# Patient Record
Sex: Female | Born: 1935
Health system: Southern US, Community
[De-identification: ages and names within clinical notes are randomized; demographics above are authoritative.]

## PROBLEM LIST (undated history)

## (undated) DIAGNOSIS — I509 Heart failure, unspecified: Secondary | ICD-10-CM

## (undated) DIAGNOSIS — F419 Anxiety disorder, unspecified: Secondary | ICD-10-CM

## (undated) DIAGNOSIS — J189 Pneumonia, unspecified organism: Secondary | ICD-10-CM

## (undated) DIAGNOSIS — R011 Cardiac murmur, unspecified: Secondary | ICD-10-CM

## (undated) DIAGNOSIS — R0600 Dyspnea, unspecified: Secondary | ICD-10-CM

## (undated) DIAGNOSIS — I350 Nonrheumatic aortic (valve) stenosis: Secondary | ICD-10-CM

## (undated) DIAGNOSIS — Z8739 Personal history of other diseases of the musculoskeletal system and connective tissue: Secondary | ICD-10-CM

## (undated) DIAGNOSIS — T8859XA Other complications of anesthesia, initial encounter: Secondary | ICD-10-CM

## (undated) DIAGNOSIS — B379 Candidiasis, unspecified: Secondary | ICD-10-CM

## (undated) DIAGNOSIS — I10 Essential (primary) hypertension: Secondary | ICD-10-CM

## (undated) DIAGNOSIS — T4145XA Adverse effect of unspecified anesthetic, initial encounter: Secondary | ICD-10-CM

## (undated) DIAGNOSIS — K8020 Calculus of gallbladder without cholecystitis without obstruction: Secondary | ICD-10-CM

## (undated) DIAGNOSIS — E0590 Thyrotoxicosis, unspecified without thyrotoxic crisis or storm: Secondary | ICD-10-CM

## (undated) DIAGNOSIS — M1990 Unspecified osteoarthritis, unspecified site: Secondary | ICD-10-CM

## (undated) DIAGNOSIS — K219 Gastro-esophageal reflux disease without esophagitis: Secondary | ICD-10-CM

## (undated) DIAGNOSIS — N189 Chronic kidney disease, unspecified: Secondary | ICD-10-CM

## (undated) HISTORY — DX: Personal history of other diseases of the musculoskeletal system and connective tissue: Z87.39

## (undated) HISTORY — PX: TUBAL LIGATION: SHX77

## (undated) HISTORY — PX: EYE SURGERY: SHX253

## (undated) HISTORY — PX: KNEE SURGERY: SHX244

## (undated) HISTORY — DX: Essential (primary) hypertension: I10

## (undated) HISTORY — PX: COLONOSCOPY: SHX174

## (undated) HISTORY — DX: Candidiasis, unspecified: B37.9

## (undated) SURGERY — Surgical Case
Anesthesia: *Unknown

---

## 1972-10-08 HISTORY — PX: ABDOMINAL HYSTERECTOMY: SHX81

## 1973-10-08 HISTORY — PX: HERNIA REPAIR: SHX51

## 1998-02-28 ENCOUNTER — Other Ambulatory Visit: Admission: RE | Admit: 1998-02-28 | Discharge: 1998-02-28 | Payer: Self-pay | Admitting: Internal Medicine

## 1998-11-08 ENCOUNTER — Other Ambulatory Visit: Admission: RE | Admit: 1998-11-08 | Discharge: 1998-11-08 | Payer: Self-pay | Admitting: Gynecology

## 1999-12-11 ENCOUNTER — Other Ambulatory Visit: Admission: RE | Admit: 1999-12-11 | Discharge: 1999-12-11 | Payer: Self-pay | Admitting: Gynecology

## 2001-01-28 ENCOUNTER — Other Ambulatory Visit: Admission: RE | Admit: 2001-01-28 | Discharge: 2001-01-28 | Payer: Self-pay | Admitting: *Deleted

## 2001-05-29 ENCOUNTER — Ambulatory Visit (HOSPITAL_COMMUNITY): Admission: RE | Admit: 2001-05-29 | Discharge: 2001-05-29 | Payer: Self-pay | Admitting: Gastroenterology

## 2001-05-29 ENCOUNTER — Encounter (INDEPENDENT_AMBULATORY_CARE_PROVIDER_SITE_OTHER): Payer: Self-pay | Admitting: *Deleted

## 2002-02-05 ENCOUNTER — Other Ambulatory Visit: Admission: RE | Admit: 2002-02-05 | Discharge: 2002-02-05 | Payer: Self-pay | Admitting: *Deleted

## 2003-04-23 ENCOUNTER — Other Ambulatory Visit: Admission: RE | Admit: 2003-04-23 | Discharge: 2003-04-23 | Payer: Self-pay

## 2004-06-01 ENCOUNTER — Other Ambulatory Visit: Admission: RE | Admit: 2004-06-01 | Discharge: 2004-06-01 | Payer: Self-pay | Admitting: Family Medicine

## 2005-08-21 ENCOUNTER — Encounter: Admission: RE | Admit: 2005-08-21 | Discharge: 2005-08-21 | Payer: Self-pay | Admitting: Family Medicine

## 2006-02-28 ENCOUNTER — Encounter: Admission: RE | Admit: 2006-02-28 | Discharge: 2006-02-28 | Payer: Self-pay | Admitting: Family Medicine

## 2006-06-04 ENCOUNTER — Ambulatory Visit: Payer: Self-pay | Admitting: Family Medicine

## 2006-06-20 ENCOUNTER — Ambulatory Visit: Payer: Self-pay | Admitting: Family Medicine

## 2006-07-29 ENCOUNTER — Ambulatory Visit: Payer: Self-pay | Admitting: Family Medicine

## 2006-08-13 ENCOUNTER — Encounter: Admission: RE | Admit: 2006-08-13 | Discharge: 2006-08-13 | Payer: Self-pay | Admitting: Family Medicine

## 2006-08-19 ENCOUNTER — Encounter: Admission: RE | Admit: 2006-08-19 | Discharge: 2006-08-19 | Payer: Self-pay | Admitting: Family Medicine

## 2006-08-26 ENCOUNTER — Encounter: Admission: RE | Admit: 2006-08-26 | Discharge: 2006-08-26 | Payer: Self-pay | Admitting: Family Medicine

## 2006-09-12 ENCOUNTER — Ambulatory Visit: Payer: Self-pay | Admitting: Pulmonary Disease

## 2006-09-12 ENCOUNTER — Ambulatory Visit: Payer: Self-pay | Admitting: Cardiovascular Disease

## 2006-09-23 ENCOUNTER — Encounter (INDEPENDENT_AMBULATORY_CARE_PROVIDER_SITE_OTHER): Payer: Self-pay | Admitting: *Deleted

## 2006-09-23 ENCOUNTER — Other Ambulatory Visit: Admission: RE | Admit: 2006-09-23 | Discharge: 2006-09-23 | Payer: Self-pay | Admitting: Interventional Radiology

## 2006-09-23 ENCOUNTER — Encounter: Admission: RE | Admit: 2006-09-23 | Discharge: 2006-09-23 | Payer: Self-pay | Admitting: Family Medicine

## 2006-09-25 ENCOUNTER — Ambulatory Visit: Admission: RE | Admit: 2006-09-25 | Discharge: 2006-09-25 | Payer: Self-pay | Admitting: Pulmonary Disease

## 2006-09-25 ENCOUNTER — Ambulatory Visit: Payer: Self-pay | Admitting: Pulmonary Disease

## 2006-09-25 ENCOUNTER — Encounter (INDEPENDENT_AMBULATORY_CARE_PROVIDER_SITE_OTHER): Payer: Self-pay | Admitting: *Deleted

## 2006-10-27 ENCOUNTER — Emergency Department (HOSPITAL_COMMUNITY): Admission: EM | Admit: 2006-10-27 | Discharge: 2006-10-27 | Payer: Self-pay | Admitting: Emergency Medicine

## 2006-10-30 ENCOUNTER — Ambulatory Visit: Payer: Self-pay | Admitting: Pulmonary Disease

## 2006-11-08 HISTORY — PX: LUNG REMOVAL, PARTIAL: SHX233

## 2006-11-12 ENCOUNTER — Ambulatory Visit: Payer: Self-pay | Admitting: Thoracic Surgery

## 2006-11-28 ENCOUNTER — Ambulatory Visit: Payer: Self-pay | Admitting: Thoracic Surgery

## 2006-11-28 ENCOUNTER — Encounter (INDEPENDENT_AMBULATORY_CARE_PROVIDER_SITE_OTHER): Payer: Self-pay | Admitting: Specialist

## 2006-11-28 ENCOUNTER — Inpatient Hospital Stay (HOSPITAL_COMMUNITY): Admission: RE | Admit: 2006-11-28 | Discharge: 2006-12-04 | Payer: Self-pay | Admitting: Thoracic Surgery

## 2006-11-28 ENCOUNTER — Ambulatory Visit: Payer: Self-pay | Admitting: Pulmonary Disease

## 2006-11-28 ENCOUNTER — Encounter: Payer: Self-pay | Admitting: Pulmonary Disease

## 2006-12-11 ENCOUNTER — Ambulatory Visit: Payer: Self-pay | Admitting: Thoracic Surgery

## 2006-12-11 ENCOUNTER — Encounter: Admission: RE | Admit: 2006-12-11 | Discharge: 2006-12-11 | Payer: Self-pay | Admitting: Thoracic Surgery

## 2007-01-01 ENCOUNTER — Ambulatory Visit: Payer: Self-pay | Admitting: Thoracic Surgery

## 2007-01-01 ENCOUNTER — Encounter: Admission: RE | Admit: 2007-01-01 | Discharge: 2007-01-01 | Payer: Self-pay | Admitting: Thoracic Surgery

## 2007-01-07 ENCOUNTER — Ambulatory Visit: Payer: Self-pay | Admitting: Pulmonary Disease

## 2007-01-08 ENCOUNTER — Ambulatory Visit: Payer: Self-pay | Admitting: Family Medicine

## 2007-01-29 ENCOUNTER — Ambulatory Visit: Payer: Self-pay | Admitting: Thoracic Surgery

## 2007-01-29 ENCOUNTER — Encounter: Admission: RE | Admit: 2007-01-29 | Discharge: 2007-01-29 | Payer: Self-pay | Admitting: Thoracic Surgery

## 2007-03-26 ENCOUNTER — Encounter: Admission: RE | Admit: 2007-03-26 | Discharge: 2007-03-26 | Payer: Self-pay | Admitting: Thoracic Surgery

## 2007-03-26 ENCOUNTER — Ambulatory Visit: Payer: Self-pay | Admitting: Thoracic Surgery

## 2007-05-29 ENCOUNTER — Ambulatory Visit: Payer: Self-pay | Admitting: Thoracic Surgery

## 2007-05-29 ENCOUNTER — Encounter: Admission: RE | Admit: 2007-05-29 | Discharge: 2007-05-29 | Payer: Self-pay | Admitting: Thoracic Surgery

## 2007-08-19 ENCOUNTER — Emergency Department (HOSPITAL_COMMUNITY): Admission: EM | Admit: 2007-08-19 | Discharge: 2007-08-19 | Payer: Self-pay | Admitting: *Deleted

## 2007-08-27 ENCOUNTER — Encounter: Admission: RE | Admit: 2007-08-27 | Discharge: 2007-08-27 | Payer: Self-pay | Admitting: Thoracic Surgery

## 2007-08-27 ENCOUNTER — Ambulatory Visit: Payer: Self-pay | Admitting: Thoracic Surgery

## 2007-10-30 ENCOUNTER — Ambulatory Visit: Payer: Self-pay | Admitting: Family Medicine

## 2007-11-11 ENCOUNTER — Ambulatory Visit: Payer: Self-pay | Admitting: Pulmonary Disease

## 2007-11-11 DIAGNOSIS — R0602 Shortness of breath: Secondary | ICD-10-CM | POA: Insufficient documentation

## 2007-11-12 DIAGNOSIS — M549 Dorsalgia, unspecified: Secondary | ICD-10-CM | POA: Insufficient documentation

## 2007-12-18 ENCOUNTER — Ambulatory Visit: Payer: Self-pay | Admitting: Family Medicine

## 2008-02-24 IMAGING — CR DG CHEST 2V
2 series · 2 of 2 positions shown · non-contrast
Comparison: 01/19/07

CLINICAL DATA: Post thoracotomy for lung cancer.
 CHEST - 2 VIEW:

[w chest pa]
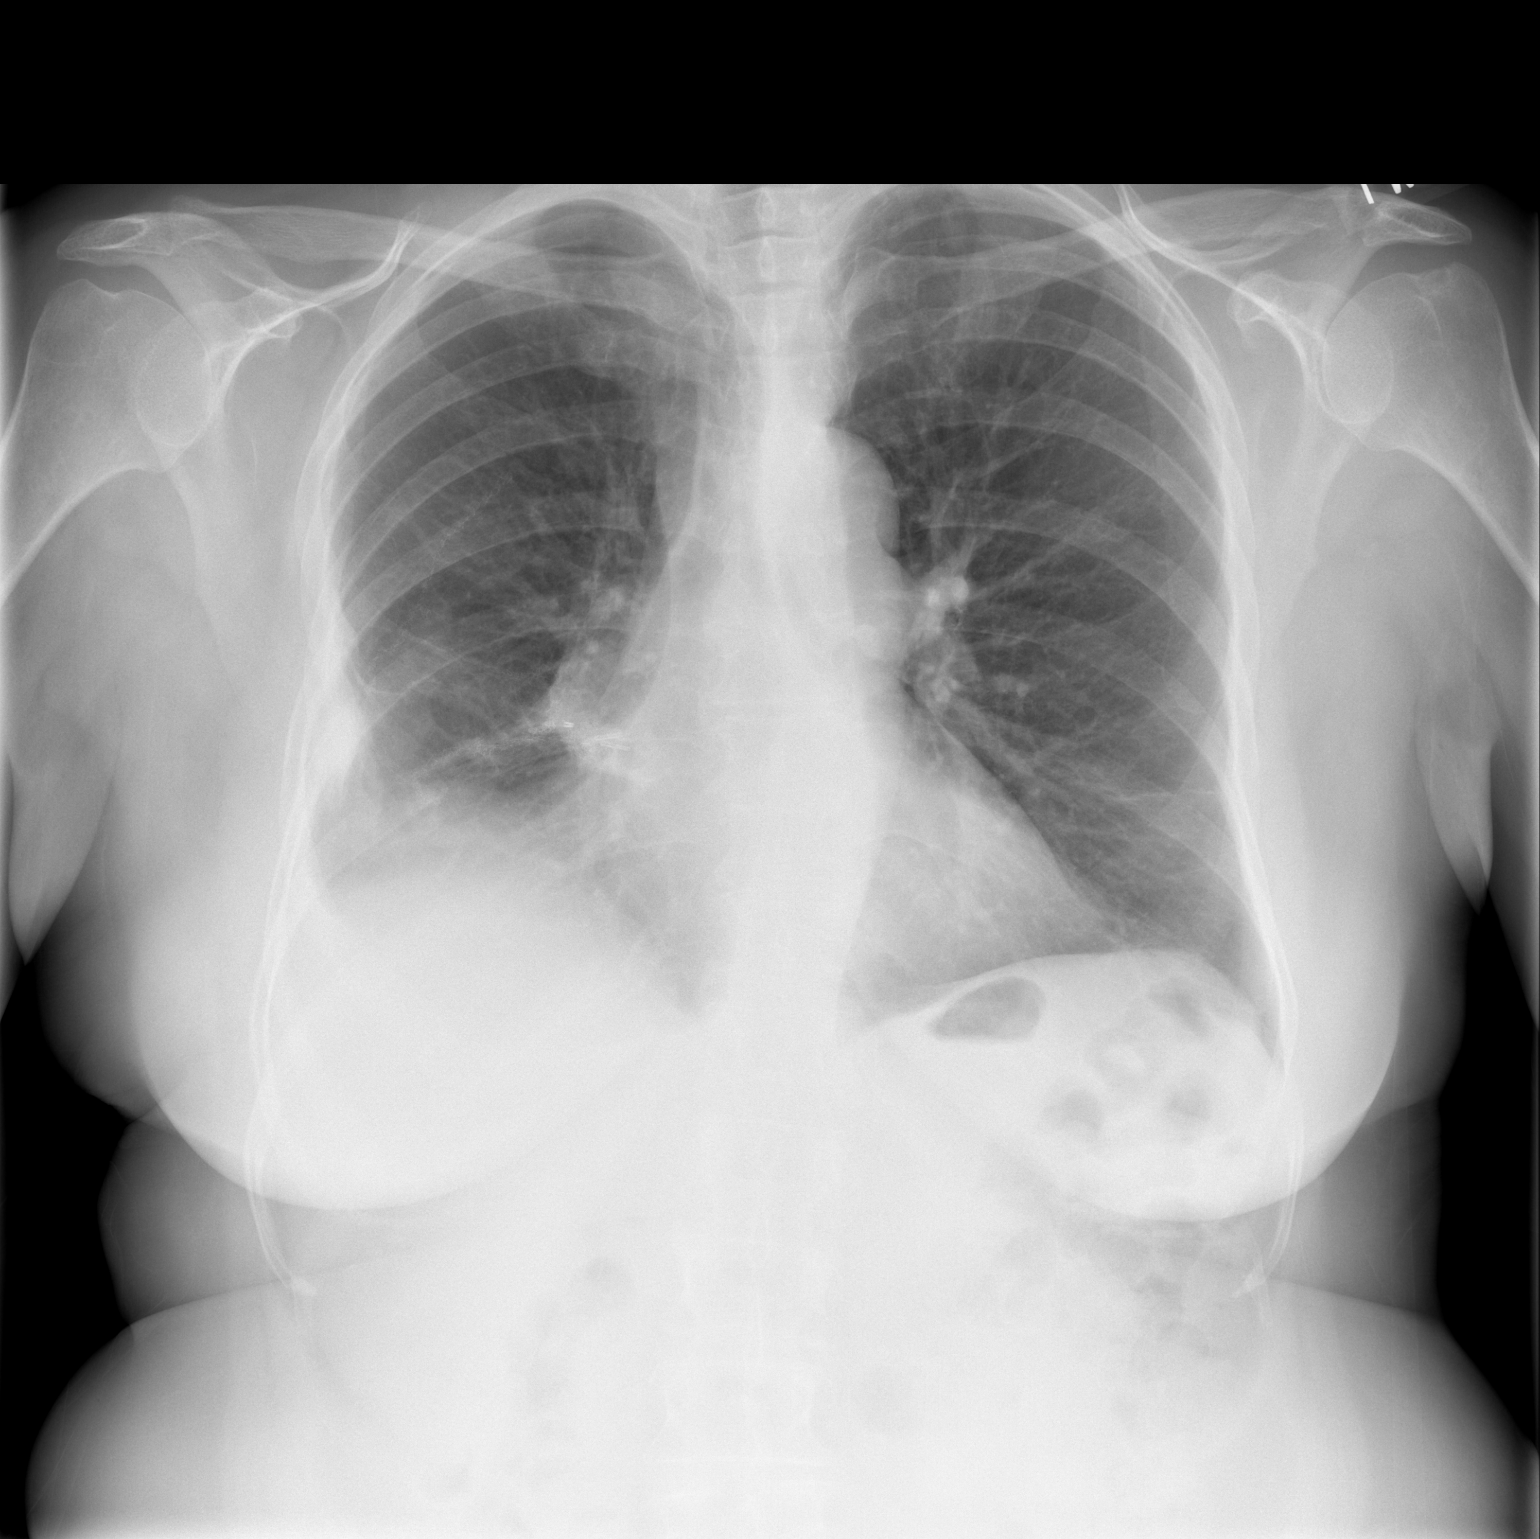

[w chest lat]
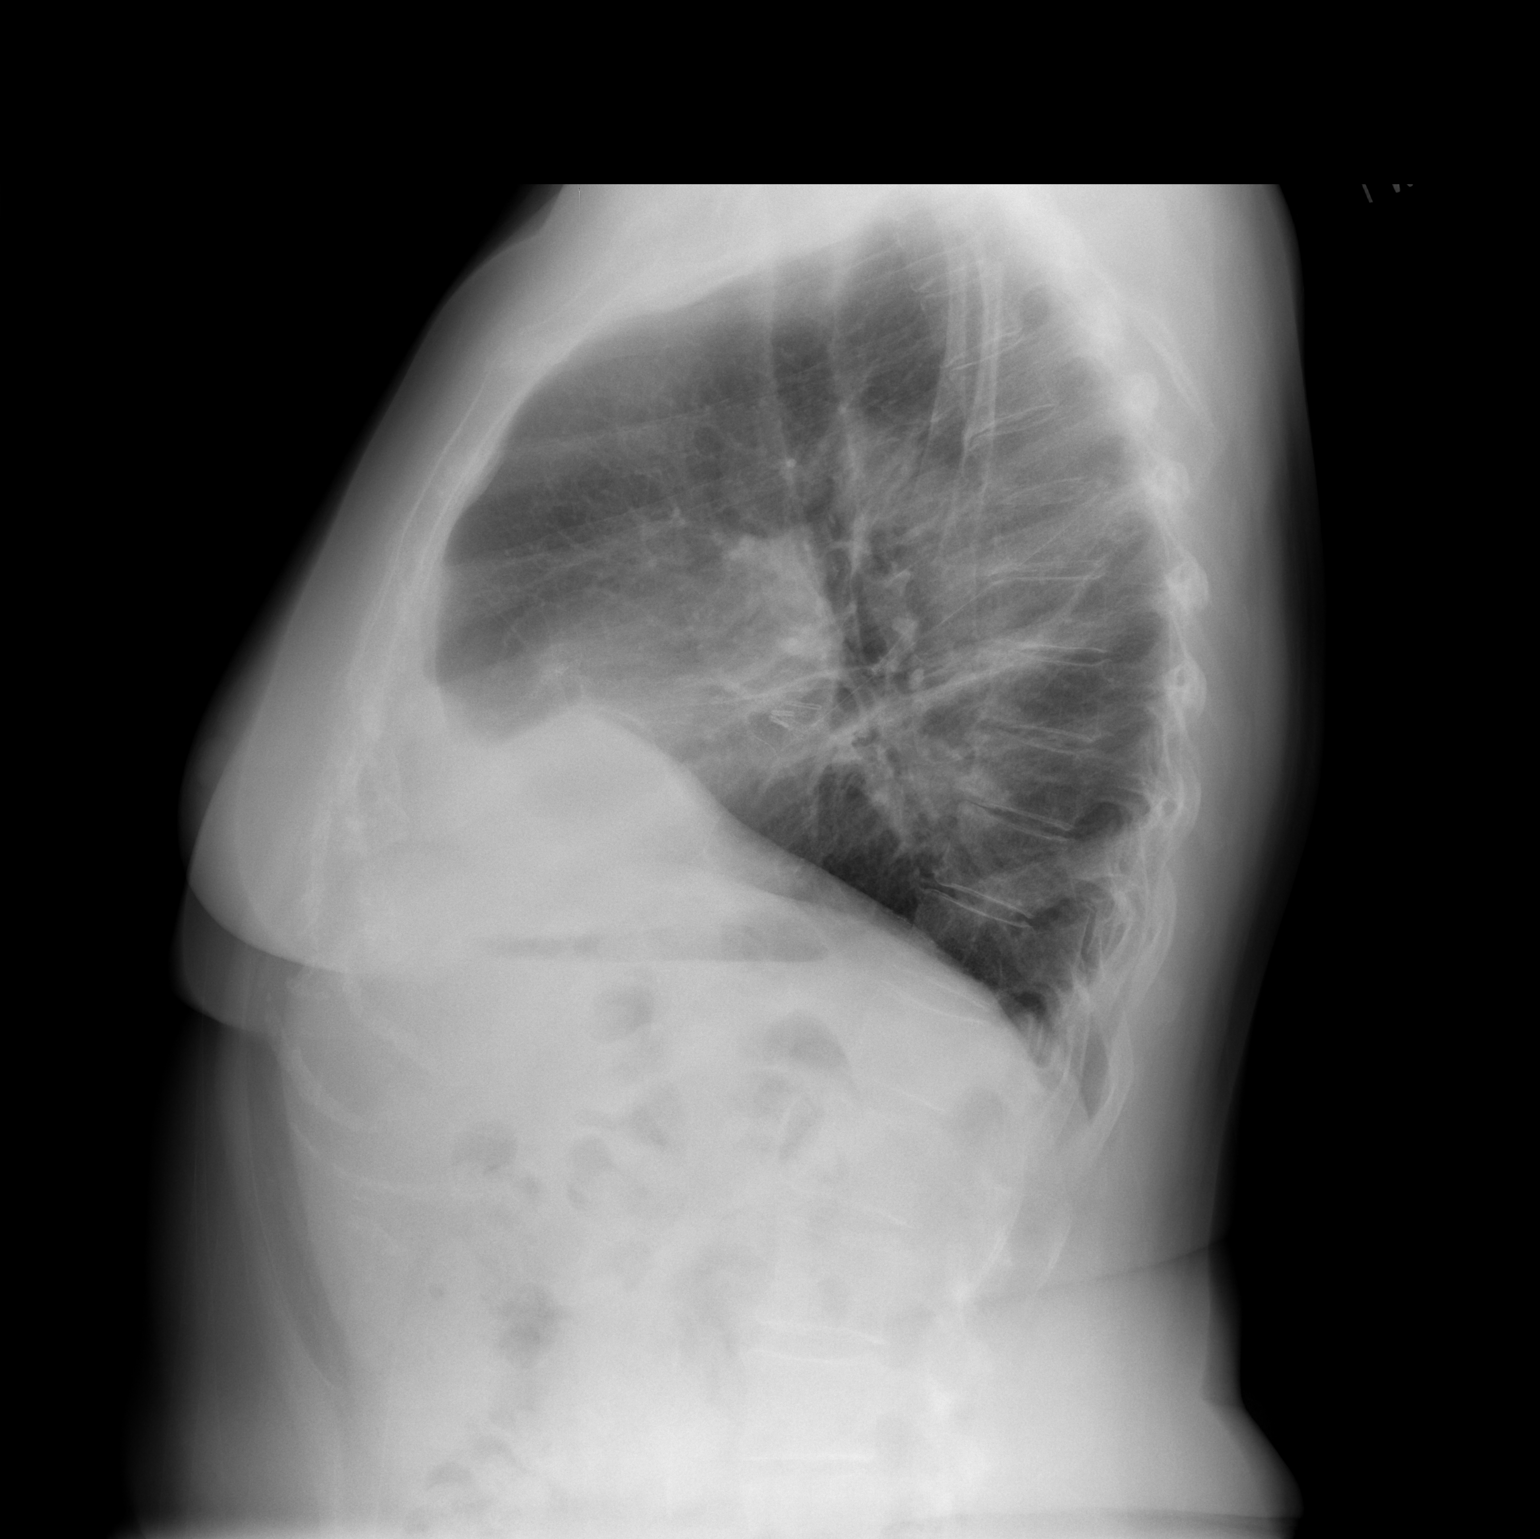

[2 of 2 positions shown; findings below may reference images not displayed]

FINDINGS: Post operative changes are noted at the right base with pleuroparenchymal scarring.  On the PA view, there appears to be a nodule in the left lung apex that was not present previously.  This could be some type of artifact, but a metastatic lesion cannot be excluded.  Consider short term follow-up by chest x-ray or CT.
 The lungs are otherwise clear.  Bony structures intact.
IMPRESSION: 1.  Post operative changes at the right base. 
 2.  Abnormal density in the left lung apex - see report

## 2008-05-26 ENCOUNTER — Ambulatory Visit: Payer: Self-pay | Admitting: Pulmonary Disease

## 2008-05-26 DIAGNOSIS — J479 Bronchiectasis, uncomplicated: Secondary | ICD-10-CM | POA: Insufficient documentation

## 2008-07-27 ENCOUNTER — Encounter (HOSPITAL_COMMUNITY): Admission: RE | Admit: 2008-07-27 | Discharge: 2008-10-07 | Payer: Self-pay | Admitting: Endocrinology

## 2008-11-26 ENCOUNTER — Ambulatory Visit: Payer: Self-pay | Admitting: Pulmonary Disease

## 2008-12-22 ENCOUNTER — Encounter: Payer: Self-pay | Admitting: Pulmonary Disease

## 2008-12-30 ENCOUNTER — Ambulatory Visit (HOSPITAL_COMMUNITY): Admission: RE | Admit: 2008-12-30 | Discharge: 2008-12-30 | Payer: Self-pay | Admitting: Cardiology

## 2008-12-30 ENCOUNTER — Encounter: Payer: Self-pay | Admitting: Pulmonary Disease

## 2009-03-29 ENCOUNTER — Encounter: Payer: Self-pay | Admitting: Pulmonary Disease

## 2009-07-13 ENCOUNTER — Emergency Department (HOSPITAL_COMMUNITY): Admission: EM | Admit: 2009-07-13 | Discharge: 2009-07-14 | Payer: Self-pay | Admitting: Emergency Medicine

## 2010-07-06 ENCOUNTER — Ambulatory Visit: Payer: Self-pay | Admitting: Pulmonary Disease

## 2010-07-21 ENCOUNTER — Ambulatory Visit: Payer: Self-pay | Admitting: Pulmonary Disease

## 2010-08-28 ENCOUNTER — Telehealth (INDEPENDENT_AMBULATORY_CARE_PROVIDER_SITE_OTHER): Payer: Self-pay | Admitting: *Deleted

## 2010-09-11 ENCOUNTER — Encounter: Admission: RE | Admit: 2010-09-11 | Discharge: 2010-09-11 | Payer: Self-pay | Admitting: Internal Medicine

## 2010-10-11 ENCOUNTER — Ambulatory Visit: Admit: 2010-10-11 | Payer: Self-pay | Admitting: Pulmonary Disease

## 2010-10-12 ENCOUNTER — Telehealth: Payer: Self-pay | Admitting: Pulmonary Disease

## 2010-10-16 ENCOUNTER — Ambulatory Visit
Admission: RE | Admit: 2010-10-16 | Discharge: 2010-10-16 | Payer: Self-pay | Source: Home / Self Care | Attending: Pulmonary Disease | Admitting: Pulmonary Disease

## 2010-10-29 ENCOUNTER — Encounter: Payer: Self-pay | Admitting: Cardiology

## 2010-11-07 NOTE — Assessment & Plan Note (Signed)
Summary: acute sick visit for dyspnea   Primary Provider/Referring Provider:  [name]  CC:  follow up. Pt states her breathing has not been doing well. Pt states any little activity she does is making it more difficult to breath and she has to start breathing out her mouth. .  History of Present Illness: The pt comes in today for an acute sick visit regarding sob.  She has known RML bronchiectasis with recurrent infections, and is s/p lobectomy with resolution of this issue.  She came in to see me last year with chest and throat pressure/fullness, and found to have minimal airflow obstruction on spirometry.  She did not have an improvement in her symptoms with albuterol.  I recommended a cardiac w/u since I could find no pulmonary issue to explain her symptoms, and apparently this was done (the records will be obtained).  She comes in today where she is having more sob with the least bit of exertion.  She denies any cough, congestion, or mucus production.    Current Medications (verified): 1)  Adult Aspirin Ec Low Strength 81 Mg  Tbec (Aspirin) .... Once Daily 2)  Metoprolol Tartrate 25 Mg  Tabs (Metoprolol Tartrate) .... Once Daily 3)  Trilipix 135 Mg  Cpdr (Choline Fenofibrate) .... Take 1 Tablet By Mouth Once A Day 4)  Clonazepam 0.5 Mg  Tabs (Clonazepam) .... Once Daily As Needed 5)  Tylenol Extra Strength 500 Mg  Tabs (Acetaminophen) .... As Needed 6)  Fish Oil 1000 Mg  Caps (Omega-3 Fatty Acids) .... Take 1 Tablet By Mouth Three Times A Day 7)  Methimazole 10 Mg Tabs (Methimazole) .... 1/2 Once Daily 8)  Vitamin D 04540 Unit Caps (Ergocalciferol) .Marland Kitchen.. 1 Weekly 9)  Furosemide 20 Mg Tabs (Furosemide) .... Once Daily 10)  Glucosamine-Chondroitin 1500-1200 Mg/61ml Liqd (Glucosamine-Chondroitin) .... Once Daily 11)  Klor-Con 10 10 Meq Cr-Tabs (Potassium Chloride) .... Once Daily 12)  Boniva 150 Mg Tabs (Ibandronate Sodium) .... Once A Month  Allergies (verified): 1)  ! Advil  Past  History:  Past medical, surgical, family and social histories (including risk factors) reviewed, and no changes noted (except as noted below).  Past Medical History: Reviewed history from 11/10/2007 and no changes required. Pneumonia  Family History: Reviewed history and no changes required.  Social History: Reviewed history and no changes required.  Review of Systems       The patient complains of shortness of breath with activity and joint stiffness or pain.  The patient denies shortness of breath at rest, productive cough, non-productive cough, coughing up blood, chest pain, irregular heartbeats, acid heartburn, indigestion, loss of appetite, weight change, abdominal pain, difficulty swallowing, sore throat, tooth/dental problems, headaches, nasal congestion/difficulty breathing through nose, sneezing, itching, ear ache, anxiety, depression, hand/feet swelling, rash, change in color of mucus, and fever.    Vital Signs:  Patient profile:   77 year old female Height:      61.5 inches Weight:      209.50 pounds BMI:     39.08 O2 Sat:      96 % on Room air Temp:     97.9 degrees F oral Pulse rate:   62 / minute BP sitting:   112 / 78  (right arm) Cuff size:   regular  Vitals Entered By: Carver Fila (July 06, 2010 3:09 PM)  O2 Flow:  Room air CC: follow up. Pt states her breathing has not been doing well. Pt states any little activity she does is  making it more difficult to breath and she has to start breathing out her mouth.  Comments meds and allergies updated Phone number updated Carver Fila  July 06, 2010 3:09 PM    Physical Exam  General:  obese female in nad Nose:  patent without discharge Mouth:  clear Neck:  no tmg Lungs:  totally clear, no wheezing or rhonchi Heart:  rrr, no mrg Extremities:  1+ edema , no cyanosis  pulses intact distally. Neurologic:  alert and oriented, moves all 4.   Impression & Recommendations:  Problem # 1:  DYSPNEA  (ICD-786.05) the pt is having worsening doe with no obvious pulmonary issue found.  Her lungs are clear, she has a h/o only minimal airflow obstruction, but she is obese and likely to have deconditioning.  I will check a cxr today for completeness, but are unlikely to find anything.  I will need to try and get her cardiac records from last year to see if there were any abnormalities to suggest a reason for her shortness of breath.  Will also repeat her pfts in the event there has been worsening of her prior minimal obstructive disease.  Problem # 2:  BRONCHIECTASIS WITHOUT ACUTE EXACERBATION (ICD-494.0) the pt has a h/o RML bronchiectasis that has been corrected with RML lobectomy.  She has not had issues with pulmonary infection since.    Medications Added to Medication List This Visit: 1)  Methimazole 10 Mg Tabs (Methimazole) .... 1/2 once daily 2)  Furosemide 20 Mg Tabs (Furosemide) .... Once daily 3)  Glucosamine-chondroitin 1500-1200 Mg/32ml Liqd (Glucosamine-chondroitin) .... Once daily 4)  Klor-con 10 10 Meq Cr-tabs (Potassium chloride) .... Once daily 5)  Boniva 150 Mg Tabs (Ibandronate sodium) .... Once a month  Other Orders: Est. Patient Level IV (16109) Pulmonary Referral (Pulmonary) T-2 View CXR (71020TC)  Patient Instructions: 1)  will get records from your cardiologist to review 2)  will check cxr today, and let you know results 3)  will schedule for breathing tests and see you back on same day to review with you. 4)  work on weight loss

## 2010-11-07 NOTE — Progress Notes (Signed)
Summary: dulera rx  Phone Note Call from Patient   Caller: Patient Call For: clance Summary of Call: pt would like prescript for dulera .she had samples only [facility]  [place] Initial call taken by: Rickard Patience,  August 28, 2010 4:52 PM  Follow-up for Phone Call        pt states Elwin Sleight is working well for her pls advise if ok to give rx? Follow-up by: Philipp Deputy CMA,  August 28, 2010 5:00 PM  Additional Follow-up for Phone Call Additional follow up Details #1::        ok to call in one, 3 fills needs to see me in 6 weeks in office. Additional Follow-up by: Barbaraann Share MD,  August 28, 2010 5:31 PM    Additional Follow-up for Phone Call Additional follow up Details #2::    Called, spoke with pt.  She is aware dulera rx sent to [facility] and [facility] would like to see her back in 6 wks.  ROV scheduled for 10/11/10 at 1:45pm - pt aware. Crystal Jones RN  August 29, 2010 9:02 AM   New/Updated Medications: DULERA 100-5 MCG/ACT AERO (MOMETASONE FURO-FORMOTEROL FUM) Inhale 2 puffs in am and pm.  Rinse mouth well Prescriptions: DULERA 100-5 MCG/ACT AERO (MOMETASONE FURO-FORMOTEROL FUM) Inhale 2 puffs in am and pm.  Rinse mouth well  #1 x 2   Entered by:   Gweneth Dimitri RN   Authorized by:   Barbaraann Share MD   Signed by:   Gweneth Dimitri RN on 08/29/2010   Method used:   Electronically to        [facility] (retail)       [address]       [place], [place]  [address]       Ph: [phone]       Fax: [phone]   RxID:   [id number]

## 2010-11-07 NOTE — Miscellaneous (Signed)
Summary: Orders Update pft charges  Clinical Lists Changes  Orders: Added new Service order of Carbon Monoxide diffusing w/capacity (94720) - Signed Added new Service order of Lung Volumes (94240) - Signed Added new Service order of Spirometry (Pre & Post) (94060) - Signed 

## 2010-11-07 NOTE — Assessment & Plan Note (Signed)
Summary: ov for back pain and sob   Chief Complaint:  OV back pain referred by Dr Susann Givens.  History of Present Illness: the patient comes in today with the complaint of left shoulder and back pain.  She is status post right middle lobectomy for right middle lobe syndrome, and has been doing very very well from a pulmonary perspective since her surgery.  She has had no further episodes of pneumonia since that time.  She has been having pain extending into her right breast and associated with her surgical incision.  This has been followed by thoracic surgery.  She also complains of some mild increased shortness of breath and some chest tightness.  She has no known history of obstructive lung disease.       Risk Factors:  Tobacco use:  never   Review of Systems      See HPI   Vital Signs:  Patient Profile:   77 Years Old Female Weight:      208.50 pounds O2 Sat:      97 % O2 treatment:    Room Air Temp:     98.4 degrees F oral Pulse rate:   65 / minute BP sitting:   132 / 80  (left arm)  Vitals Entered By: Cloyde Reams RN (November 11, 2007 10:22 AM)             Comments Pt here today for OV for back pain referred by Dr Susann Givens. Pt had right middle lobe removed by Dr Edwyna Shell a year ago.  Pt  was told to f/u with Dr Shelle Iron.  Pt c/o pain under L shoulder blade in back.  C/o chest tightness.  Still has soreness in R breast, Dr Edwyna Shell advised pt it was nerve pain, but pt wants to know how long this will last. Medications reviewed      Physical Exam  General:     overweight female in no acute distress Lungs:     chest is totally clear to auscultation with no wheezes or rhonchi Heart:     regular rate and rhythm, no murmurs rubs or gallops     Impression & Recommendations:  Problem # 1:  DYSPNEA (ICD-786.05) the patient comes in today complaining of mild increased shortness of breath.  There is nothing to suggest bronchospasm or other acute pulmonary process.  Her lungs  are totally clear to auscultation.  I will check a chest x-ray today for completeness, however spirometry really shows no significant airflow obstruction.  She obviously needs to work on weight loss and conditioning.   Problem # 2:  BACK PAIN (ICD-724.5) the patient is complaining of left shoulder and left back pain, and her surgery was done overall in the right side.  I will check a chest x-ray for completeness, but I suspect this is musculoskeletal.  If the chest x-ray is unremarkable, I have asked the patient to discuss her discomfort with her primary physician.  Medications Added to Medication List This Visit: 1)  Tylenol Extra Strength 500 Mg Tabs (Acetaminophen) .... As needed   Patient Instructions: 1)  will check xray today.  if no issues found, and left back pain continues, please call your primary care md for follow up 2)  work on weight loss and exercise program.    ]   Pulmonary Testing  Spirometry  Actual FVC: 2.48 L FEV1 1.71 L FEV1/FVC: 69 %  Predicted  % Predicted FVC: 95% FEV1: 83%  Interpretation Study normal Comments: patient  technique is barely adequate.  Oximetry  Resting Data: Pulse: 65 BPM

## 2010-11-07 NOTE — Assessment & Plan Note (Signed)
Summary: rov for dyspnea, review of pfts   Primary Provider/Referring Provider:  [name]  CC:  followup on pfts.  History of Present Illness: The pt comes in today for f/u of her recent pfts as part of a w/u for doe unkown origin.  She was found to have very mild obstruction, no restriction, and a mildly decreased DLCO that corrects with Av.  I have reviewed the study with her in detail, and answered all of her questions.    Current Medications (verified): 1)  Adult Aspirin Ec Low Strength 81 Mg  Tbec (Aspirin) .... Once Daily 2)  Metoprolol Tartrate 25 Mg  Tabs (Metoprolol Tartrate) .... Once Daily 3)  Trilipix 135 Mg  Cpdr (Choline Fenofibrate) .... Take 1 Tablet By Mouth Once A Day 4)  Clonazepam 0.5 Mg  Tabs (Clonazepam) .... Once Daily As Needed 5)  Tylenol Extra Strength 500 Mg  Tabs (Acetaminophen) .... As Needed 6)  Fish Oil 1000 Mg  Caps (Omega-3 Fatty Acids) .... Take 1 Tablet By Mouth Three Times A Day 7)  Methimazole 10 Mg Tabs (Methimazole) .... 1/2 Once Daily 8)  Furosemide 20 Mg Tabs (Furosemide) .... Once Daily 9)  Glucosamine-Chondroitin 1500-1200 Mg/64ml Liqd (Glucosamine-Chondroitin) .... Once Daily 10)  Klor-Con 10 10 Meq Cr-Tabs (Potassium Chloride) .... Once Daily 11)  Boniva 150 Mg Tabs (Ibandronate Sodium) .... Once A Month 12)  Otc Vitamin D 2000iu .Marland Kitchen.. 3 Times A Week  Allergies: 1)  ! Advil  Past History:  Past medical, surgical, family and social histories (including risk factors) reviewed, and no changes noted (except as noted below).  Past Medical History:  H/o RML bronchiectasis with recurrent pna...s/p RM lobectomy  Family History: Reviewed history and no changes required.  Social History: Reviewed history and no changes required.  Review of Systems       The patient complains of shortness of breath with activity, productive cough, indigestion, anxiety, hand/feet swelling, joint stiffness or pain, and rash.  The patient denies shortness  of breath at rest, coughing up blood, chest pain, irregular heartbeats, acid heartburn, loss of appetite, weight change, abdominal pain, difficulty swallowing, sore throat, tooth/dental problems, headaches, nasal congestion/difficulty breathing through nose, sneezing, itching, ear ache, depression, and fever.    Vital Signs:  Patient profile:   77 year old female Height:      61.5 inches Weight:      211 pounds BMI:     39.36 O2 Sat:      97 % on Room air Temp:     97.8 degrees F oral Pulse rate:   68 / minute BP sitting:   118 / 78  (right arm) Cuff size:   large  Vitals Entered By: Kandice Hams CMA (July 21, 2010 12:05 PM)  O2 Flow:  Room air CC: followup on pfts  Does patient need assistance? Functional Status Self care Comments meds and pharmacy verified   Physical Exam  General:  obese female in nad Nose:  no discharge or purulence  Lungs:  totally clear to auscultation, no wheezing or rhonchi Heart:  rrr, n 2/6 sem Extremities:  1+ edema noted, no cyanosis  Neurologic:  alert and oriented, moves all 4.   Impression & Recommendations:  Problem # 1:  DYSPNEA (ICD-786.05) the pt continues to have doe with only minimal obstruction on her current pfts.  We have tried her on albuterol as needed in the past with no improvement.  I suspect her airflow obstruction has nothing to do with  her current symptoms, and her recent cxr showed no acute process as well.  To put the issue to rest, will try her on dulera for a 2 week period to see if she improves.  If she does not, there is really nothing from a pulmonary standpoint that is an obvious cause for her symptoms.    Medications Added to Medication List This Visit: 1)  Otc Vitamin D 2000iu  .Marland Kitchen.. 3 times a week  Other Orders: Est. Patient Level IV (62130)  Patient Instructions: 1)  will give you a trial of dulera 100/5  2 inhalations in am and pm..rinse mouth well. 2)  please call with an update on your condition in 2  weeks.  Appended Document: rov for dyspnea, review of pfts received cardiac records on pt from [place]. pt had echo 12/2008, and showed nl EF but impaired LV relaxation, mild increase in RV pressure. she also had a stress myoview that was unremarkable in 12/2008

## 2010-11-07 NOTE — Letter (Signed)
Summary: [facility]  [facility]   Imported By: Lennie Odor 07/21/2010 15:25:51  _____________________________________________________________________  External Attachment:    Type:   Image     Comment:   External Document

## 2010-11-07 NOTE — Assessment & Plan Note (Signed)
Summary: rov for dyspnea, chest tightness   PCP:  [name]  Chief Complaint:  6 month followup. Pt states that her breathing is about the same since last seen.  She states that she is trying to work on wt. loss but this is difficult for her due to sob with walking.  She states that she tried using albuterol mdi for rescue but states this did not help at all but she used the doses in the inhaler..  History of Present Illness: the pt comes in today for f/u of her dyspnea and chest pressure associated with exertion.  She has a history of the RML syndrome, and is s/p lobectomy with significant improvement in her recurrent infections.  At the last visit, she was having persistent doe and upper chest/throat pressure.  I felt this was secondary to obesity and conditioning, but couldn't exclude cardiac etiology.  I gave her a trial of as needed albuterol to see if it would help, but asked her to contact her primary md if continued to consider a cardiac evaluation.  She comes in today where she states the albuterol did not help, and the doe and chest pressure with exertion has continued.  Unfortunately, she did not communicate with her primary md.  She denies any cough or mucus, and no recent chest infection.    Current Allergies: No known allergies   Risk Factors:  Tobacco use:  never  Review of Systems      See HPI  Vital Signs:  Patient Profile:   77 Years Old Female Weight:      216.56 pounds O2 Sat:      98 % O2 treatment:    Room Air Temp:     97.6 degrees F oral Pulse rate:   64 / minute BP sitting:   116 / 74  (left arm)  Vitals Entered By: Vernie Murders (November 26, 2008 10:03 AM)                Physical Exam  General:     obese female in nad Lungs:     totally clear to auscultation, no wheezing. Heart:     rrr   Problem # 1:  DYSPNEA (ICD-786.05) the pt continues to have doe with chest/throat pressure with exertion.  She has minimal airflow obstruction on  spirometry today, and did not see a difference with albuterol.  I suspect this has nothing to do with her current symptoms.  Her obesity and deconditioning are clearly playing roles, but I remained concerned about possible cardiac etiologies.  I really think she needs to have a cardiac work-up, and if negative, at least she knows she can push herself without getting into trouble.  Problem # 2:  BRONCHIECTASIS WITHOUT ACUTE EXACERBATION (ICD-494.0) this appears to be totally stable since her lobectomy.    Medications Added to Medication List This Visit: 1)  Methimazole 10 Mg Tabs (Methimazole) .Marland Kitchen.. 1 once daily 2)  Vitamin D 04540 Unit Caps (Ergocalciferol) .Marland KitchenMarland KitchenMarland Kitchen 1 weekly  Patient Instructions: 1)  continue to work on weight loss and conditioning 2)  please call primary md to set up cardiac evaluation.  I will fax a copy of this note to her today.

## 2010-11-07 NOTE — Assessment & Plan Note (Signed)
Summary: acute sick visit for dyspnea and atypical chest pain   PCP:  [name]  Chief Complaint:  Pt is here for a sick visit.  Pt c/o increased sob with activity- better at rest.  Pt also c/o wheezing.  Pt denied a cough or chest pain. Marland Kitchen  History of Present Illness: the pt is a 77 y/o female with known rml syndrome,  but post lobectomy, who comes in today for complaint of sob and atypical chest discomfort.  She has had pft's which showed no obstructive disease in the past, but feels that she does get sob with exertional activities.  She denies cough or purulence.  She admits to not being very active.  She has had chest discomfort of unknown etiology, but felt to be msk from her thoracic surgery.  She describes some chest tightness as well, but it is not related to exertion.        Review of Systems      See HPI   Vital Signs:  Patient Profile:   77 Years Old Female Weight:      213.38 pounds O2 Sat:      97 % O2 treatment:    Room Air Temp:     97.9 degrees F oral Pulse rate:   68 / minute BP sitting:   128 / 76  (left arm) Cuff size:   regular  Vitals Entered By: Cyndia Diver LPN (May 26, 2008 10:18 AM)             Comments Medications reviewed with patient Cyndia Diver LPN  May 26, 2008 10:18 AM      Physical Exam  General:     ow female in nad Lungs:     clear to auscultation Heart:     rrr      Impression & Recommendations:  Problem # 1:  BRONCHIECTASIS WITHOUT ACUTE EXACERBATION (ICD-494.0) the pt is doing well from a pulmonary standpoint.  She never has been documented to have obstructive disease, and is not having a chest infection at this point.  I don't mind giving her an albuterol inhaler to try as needed and see if it helps.  She will let me know.  I suspect her dyspnea is related to weight and conditioning issues, but can't exclude a cardiac process.  Problem # 2:  BACK PAIN (ICD-724.5) the pt has been having atypical back and chest  pain since surgery, and suspect it is msk.  The history is not suggestive of angina, but I have told her that she may need cardiac w/u with primary md if symptoms continue.  Medications Added to Medication List This Visit: 1)  Trilipix 135 Mg Cpdr (Choline fenofibrate) .... Take 1 tablet by mouth once a day 2)  Fish Oil 1000 Mg Caps (Omega-3 fatty acids) .... Take 1 tablet by mouth three times a day 3)  Glucosamine-chondroitin 250-200 Mg Caps (Glucosamine-chondroitin) .... Take by mouth as needed   Patient Instructions: 1)  will try albuterol 2 puffs every 4 hrs if needed 2)  if you continue to have chest and throat tightness, you need to call primary md in the event this could be your heart. 3)  f/u with me in 6mos (cancel pre-existing apptms)   ]

## 2010-11-07 NOTE — Progress Notes (Signed)
Summary: persistant cough/[facility]  persistant cough/[facility]   Imported By: Lester  05/27/2008 07:48:52  _____________________________________________________________________  External Attachment:    Type:   Image     Comment:   External Document

## 2010-11-07 NOTE — Progress Notes (Signed)
Summary: f/u bronchoscopy/[facility]  f/u bronchoscopy/[facility]   Imported By: Lester [place] 05/27/2008 07:50:29  _____________________________________________________________________  External Attachment:    Type:   Image     Comment:   External Document

## 2010-11-07 NOTE — Progress Notes (Signed)
Summary: f/u lobectomy/[facility]  f/u lobectomy/[facility]   Imported By: Lester [place] 05/27/2008 07:52:21  _____________________________________________________________________  External Attachment:    Type:   Image     Comment:   External Document

## 2010-11-09 NOTE — Assessment & Plan Note (Signed)
Summary: rov for doe   Primary Provider/Referring Provider:  [name]  CC:  F/u appt.  States Elwin Sleight is helping with breathing but still has sob with exertion.  Also c/o PND--clear drainage.Marland Kitchen  History of Present Illness: The pt comes in today for f/u of her doe.  She was found to have very subtle airflow obstruction on pfts, and was started on dulera as a trial.  She comes in today where she continues to have doe, and is unsure if the dulera has really helped.  I have reminded her that it was unclear to me if her mild pft abnormality had anything to do with her sob.  She denies any cough or congestion.  Current Medications (verified): 1)  Adult Aspirin Ec Low Strength 81 Mg  Tbec (Aspirin) .... Once Daily 2)  Metoprolol Tartrate 25 Mg  Tabs (Metoprolol Tartrate) .... Once Daily 3)  Trilipix 135 Mg  Cpdr (Choline Fenofibrate) .... Take 1 Tablet By Mouth Once A Day 4)  Clonazepam 0.5 Mg  Tabs (Clonazepam) .... Once Daily As Needed 5)  Tylenol Extra Strength 500 Mg  Tabs (Acetaminophen) .... As Needed 6)  Fish Oil 1000 Mg  Caps (Omega-3 Fatty Acids) .... Take 1 Tablet By Mouth Three Times A Day 7)  Methimazole 10 Mg Tabs (Methimazole) .... 1/2 Once Daily 8)  Furosemide 20 Mg Tabs (Furosemide) .... Once Daily 9)  Glucosamine-Chondroitin 1500-1200 Mg/55ml Liqd (Glucosamine-Chondroitin) .... Once Daily 10)  Klor-Con 10 10 Meq Cr-Tabs (Potassium Chloride) .... Once Daily 11)  Boniva 150 Mg Tabs (Ibandronate Sodium) .... Once A Month 12)  Otc Vitamin D 2000iu .Marland Kitchen.. 3 Times A Week 13)  Dulera 100-5 Mcg/act Aero (Mometasone Furo-Formoterol Fum) .... Inhale 2 Puffs in Am and Pm.  Rinse Mouth Well 14)  Vitamin C .... Take 1 Tablet By Mouth Once A Day  Allergies (verified): 1)  ! Advil  Review of Systems       The patient complains of shortness of breath with activity, productive cough, nasal congestion/difficulty breathing through nose, hand/feet swelling, and joint stiffness or pain.  The  patient denies shortness of breath at rest, non-productive cough, coughing up blood, chest pain, irregular heartbeats, acid heartburn, indigestion, loss of appetite, weight change, abdominal pain, difficulty swallowing, sore throat, tooth/dental problems, headaches, sneezing, itching, ear ache, anxiety, depression, rash, change in color of mucus, and fever.    Vital Signs:  Patient profile:   77 year old female Height:      61.5 inches Weight:      216.25 pounds BMI:     40.34 O2 Sat:      99 % on Room air Temp:     97.6 degrees F oral Pulse rate:   67 / minute BP sitting:   122 / 80  (left arm) Cuff size:   large  Vitals Entered By: Arman Filter LPN (October 16, 2010 11:27 AM)  O2 Flow:  Room air CC: F/u appt.  States Elwin Sleight is helping with breathing but still has sob with exertion.  Also c/o PND--clear drainage. Comments Medications reviewed with patient Arman Filter LPN  October 16, 2010 11:28 AM    Physical Exam  General:  morbidly obese female in nad Nose:  no purulence or discharge noted. Lungs:  clear to auscultation Heart:  rrr, no mrg Extremities:  mild edema noted, no cyanosis  Neurologic:  alert and oriented, moves all 4.   Impression & Recommendations:  Problem # 1:  DYSPNEA (ICD-786.05) the  pt has continued to have doe despite using dulera for her mild airflow obstruction noted on pfts.  My suspicion is this may have very little to do with her symptom of sob.  I think her morbid obesity and deconditioning at this point are greater issues.  Since it is unclear to her whether dulera has really helped, I have asked her to stop for a few weeks to see if she notices a difference.  If not, then there is no reason to stay on the medication, and I see no other pulmonary pathology to explain her symptoms.  If she does see definite improvement, would stay on the medication.    Medications Added to Medication List This Visit: 1)  Vitamin C  .... Take 1 tablet by mouth once  a day  Other Orders: Est. Patient Level III (10272)  Patient Instructions: 1)  would discontinue dulera for a 2-3 week period, and see if your symptoms worsen.  If they do not, would not restart your inhaler.  If they do worsen, get back on dulera, and followup with me in 6mos.  You do not need followup if you stay off inhaler. 2)  work on weight loss and conditioning.

## 2010-11-09 NOTE — Progress Notes (Signed)
Summary: nos appt  Phone Note Call from Patient   Caller: [email or web address]  Call For: Elizah Lydon Summary of Call: Rsc nos from 1/4 to 1/9. Initial call taken by: Darletta Moll,  October 12, 2010 9:06 AM

## 2011-01-11 LAB — POCT I-STAT, CHEM 8
BUN: 17 mg/dL (ref 6–23)
Calcium, Ion: 1.21 mmol/L (ref 1.12–1.32)
Chloride: 105 mEq/L (ref 96–112)
Creatinine, Ser: 0.9 mg/dL (ref 0.4–1.2)
Glucose, Bld: 112 mg/dL — ABNORMAL HIGH (ref 70–99)
HCT: 40 % (ref 36.0–46.0)
Hemoglobin: 13.6 g/dL (ref 12.0–15.0)
Potassium: 4 mEq/L (ref 3.5–5.1)
Sodium: 142 mEq/L (ref 135–145)
TCO2: 28 mmol/L (ref 0–100)

## 2011-01-11 LAB — DIFFERENTIAL
Basophils Absolute: 0.1 10*3/uL (ref 0.0–0.1)
Basophils Relative: 1 % (ref 0–1)
Eosinophils Absolute: 0.3 10*3/uL (ref 0.0–0.7)
Eosinophils Relative: 3 % (ref 0–5)
Lymphocytes Relative: 43 % (ref 12–46)
Lymphs Abs: 3.4 10*3/uL (ref 0.7–4.0)
Monocytes Absolute: 0.7 10*3/uL (ref 0.1–1.0)
Monocytes Relative: 9 % (ref 3–12)
Neutro Abs: 3.5 10*3/uL (ref 1.7–7.7)
Neutrophils Relative %: 44 % (ref 43–77)

## 2011-01-11 LAB — CBC
HCT: 37.8 % (ref 36.0–46.0)
Hemoglobin: 12.9 g/dL (ref 12.0–15.0)
MCHC: 34.2 g/dL (ref 30.0–36.0)
MCV: 86.5 fL (ref 78.0–100.0)
Platelets: 338 10*3/uL (ref 150–400)
RBC: 4.37 MIL/uL (ref 3.87–5.11)
RDW: 13.8 % (ref 11.5–15.5)
WBC: 7.9 10*3/uL (ref 4.0–10.5)

## 2011-02-20 NOTE — Letter (Signed)
August 27, 2007   Barbaraann Share, MD,FCCP  [address]  [place], [place] [address]   Re:  Savannah, Smith                DOB:  September 12, 1933   Dear Mellody Dance:   I saw this patient back in for follow-up today.  She still has some mild  chest pain but her chest x-ray is improved and just shows scarring from  her previous surgery.   Her blood pressure is 141/93, pulse 78, respirations 18, saturations are  96%.  Her incisions are well-healed.  Her lungs are clear to  auscultation and percussion.   From my standpoint, I will release her back to you for long-term follow-  up.  I will be happy to see her again if she has any future problems.   [name], M.D.  Electronically Signed   DPB/MEDQ  D:  08/27/2007  T:  08/28/2007  Job:  [id number]

## 2011-02-20 NOTE — Letter (Signed)
May 29, 2007   Barbaraann Share, MD,FCCP  [address]  [place], [place] [address]   Re:  [name], [name]                DOB:  02/01/1933   Dear Mellody Dance:   I saw [name] in the office today and she is doing remarkably well.  Her blood pressure was 121/82, pulse 62, respirations 18, sats were 97%.  Lungs:  Clear to auscultation and percussion.  Heart:  Regular sinus  rhythm, no murmur.  [name] is still having a moderate amount of chest  pain, but again this appears to be improving.  I will see her back again  in 3 months with a chest x-ray.   Ines Bloomer, M.D.  Electronically Signed   DPB/MEDQ  D:  05/29/2007  T:  05/30/2007  Job:  [id number]

## 2011-02-20 NOTE — Letter (Signed)
March 26, 2007   Barbaraann Share, MD,FCCP  [address]  [place], [place] [address]   Re:  [name], [name]                DOB:  04-14-1933   Dear Mellody Dance:   I saw the patient back in the office today. She still has some  postoperative changes on the right, but really looks better and the  postoperative changes are clearing out. It is now four months since her  surgery. Her lungs are clear to auscultation and percussion. Her blood  pressure was 116/78, pulse 64 and respirations 18 and sats 96%. Chest x-  ray, they questioned something in the left apex, but I am not sure that  is anything at all. I plan to see her back again in two months and I  will repeat her chest x-ray at that time and will pay specific attention  to this area. I told her to see you in the future for followup.   [name], M.D.  Electronically Signed   DPB/MEDQ  D:  03/26/2007  T:  03/26/2007  Job:  [id number]

## 2011-02-23 NOTE — Op Note (Signed)
NAMEHENDRIX, [name]                ACCOUNT NO.:  [id number]   MEDICAL RECORD NO.:  [id number]          PATIENT TYPE:  INP   LOCATION:  2550                         FACILITY:  [facility]   PHYSICIAN:  Ines Bloomer, M.D. DATE OF BIRTH:  07-23-1933   DATE OF PROCEDURE:  DATE OF DISCHARGE:                               OPERATIVE REPORT   PREOPERATIVE DIAGNOSIS:  Right middle lobe syndrome.   POSTOPERATIVE DIAGNOSIS:  Right middle lobe syndrome.   PROCEDURE PERFORMED:  Right video-assisted thoracic surgery, right  thoracotomy, right middle lobectomy, and lysis of adhesions.   SURGEON:  Ines Bloomer, M.D.   FIRST ASSISTANT SURGEON:  Ms. Godfrey Pick, P.A.C.   DESCRIPTION OF PROCEDURE:  After percutaneous insertion of all monitor  lines, the patient underwent general anesthesia, and was prepped and  draped in the sterile manner and turned to the right lateral thoracotomy  position.  This patient had had multiple episodes of right lobe  infection and, over the past month, he developed more pleural  inflammatory process in the right lower lobe and the right middle lobe.  He was brought to the operating room to resect the right middle lobe  because of the recurrent infections in the right middle lobe.   After. General anesthesia, he was turned to the right lateral  thoracotomy position.  A #2-11 tube was inserted.  The right chest was  prepped and draped in the usual sterile manner.  Two trocar sites were  made in the anterior and posterior axillary at the 7th intercostal  space.  Two trocars were inserted; but, in inserting the scope, there  was a marked amount of adhesions of the lower lobe and middle lobe to  the diaphragm and the chest wall and we could not really see the upper  lobe.  We converted to a posterolateral thoracotomy over the 5th  intercostal space and divided the latissimus and reflected the serratus  anteriorly.  The 5th intercostal space was entered and we then started  taking adhesions down, first taking down the adhesions to the upper lobe  and freeing up the upper lobe off the apex medially and laterally.  Then  the middle lobe was stuck to the diaphragm and the epipericardial fat.  We slowly dissected that up.  It was completely dissected free and then  there was a lot of dense adhesions to the diaphragm.  We had to use  electrocautery to remove those.   Then we turned our attention to the lower lobe which had marked reaction  in the lower lobe with dense adhesions to the diaphragm and the lateral  chest wall.  This was taken down with electrocautery, doing a partial  extrapleural resection.  We sent the thickest part of this for frozen  section and it feels inflammatory with no evidence of cancer.  We did  not feel the __________  and then finally out of the costophrenic angle  until the right lower lobe was completely freed up.  There were two  areas of tears in the lower lobe and upper lobe and these were repaired  with 3-0 Vicryl in a running fashion.  We then examined the middle lobe  which was very small and contracted and very inflamed with a marked  amount of edema around it, and elected to do a middle lobectomy.   The venous branch to the middle lobe was dissected out and stapled in  the body with the Auto Suture stapler.  This exposed the bronchus and  some very thick lymph nodes around the bronchus.  These 11-R and 10-R  nodes were dissected free.  The bronchus was resected up, stapled and  divided with the Auto Suture Duskin stapler.  Attention was then turned  to the inferior portion of the major fissure and that was divided with  the Auto Suture and Ninh stapler as well as the EZ 45 [place] stapler.  The pulmonary artery was identified and a large node was dissected free  off the pulmonary artery.  We then divided the minor fissure with the  Auto Suture 60 stapler and then came across the rest of it with the Auto  Suture 60 stapler.   There was some bleeding from the artery and this was  controlled with a horizontal mattress pledgeted suture.  After this had  been done, all bleeding was electrocoagulated, CoSeal was applied to the  staple line and to the areas of the suture.  Two chest tubes were  inserted, an anterior and posterior chest tube, through the trocar sites  and tied in place with 0 silk.  Five drill holes were placed through the  6th rib and passed around the 5th rib, and then these pericostals were  tied down.  Two chest tubes were then placed through the trocar sites  and tied in place with 0 silk.  A Marcaine block was done in the usual  fashion.  A single On-Cue was inserted without difficulty.  The chest  was closed with #1 Vicryl in the muscle layer; 2-0 Vicryl in the  subcutaneous tissue; and __________  and skin clips.  The patient was  returned to the recovery room in stable condition.      Ines Bloomer, M.D.  Electronically Signed     DPB/MEDQ  D:  11/28/2006  T:  11/29/2006  Job:  [id number]   cc:   Barbaraann Share, MD,FCCP

## 2011-02-23 NOTE — Assessment & Plan Note (Signed)
[facility]                             PULMONARY OFFICE NOTE   NAME:Smith, Savannah MUHS                       MRN:          [id number]  DATE:09/12/2006                            DOB:          December 04, 1932    HISTORY OF PRESENT ILLNESS:  The patient is a very pleasant 77 year old  female, whom I have been asked to see for persistent cough, as well as  an abnormal chest x-ray.  The patient states, at the end of August, she  began to have a cough, but no mucus or congestion.  She had no fevers,  chills or sweats.  The patient had an x-ray that showed an abnormal  density in the right middle lobe, and underwent two rounds of  antibiotics of unknown type.  She states that she has had no change in  symptoms and has continued to cough with little to no mucus.  It is mild  to moderate in nature.  The patient does state that she gets nervous at  times and has a feeling of choking in her throat area.  She feels this  is where the cough is coming from.  She denies any reflux disease  symptoms or postnasal drip.  She does describe classic pseudo-wheezing  at night and hears a rattling in her throat.  She has undergone a CT  scan of the chest on November 12, that did show air space disease in the  right middle lobe with the question raised of an occluded right middle  lobe bronchus.  There was also some evidence of calcified lymph nodes  over in the superior segment of the left lower lobe.   PAST MEDICAL HISTORY:  1. Significant for dyslipidemia.  2. Status post hysterectomy.  3. History of anxiety disorder.   CURRENT MEDICATIONS INCLUDE:  1. Aspirin 81 mg daily.  2. Metoprolol 25 mg daily.  3. Antara 130 daily.  4. Multivitamin daily.  5. Naprosyn 500 mg p.r.n.  6. Clonazepam 0.5 mg p.r.n.   ALLERGIES:  The patient has no known drug allergies.   SOCIAL HISTORY:  She has never smoked.  She is widowed and has children.  She works as a Lawyer, part-time.   FAMILY HISTORY:  Remarkable for mother having hypertension and  myocardial infarction.   REVIEW OF SYSTEMS:  As per History of Present Illness.  See patient  intake form documented in the chart.   PHYSICAL EXAMINATION:  IN GENERAL:  She is an overweight female, in no  acute distress.  VITAL SIGNS:  Blood pressure is 112/80, pulse is 71, temperature is  98.1, weight 201 pounds, O2 saturation on room air is 97%.  HEENT:  Pupils were equal, round and reactive to light and  accommodation.  Extraocular muscles are intact.  Nares are patent,  without discharge.  Oropharynx is clear.  NECK:  The neck is supple, without JVD or lymphadenopathy.  There is no  palpable thyromegaly.  CHEST:  Totally clear to auscultation.  CARDIAC EXAM:  Reveals regular rate and rhythm, no murmurs, rubs or  gallops.  ABDOMEN:  Soft,  nontender with good bowel sounds.  GENITAL EXAM, RECTAL EXAM, BREAST EXAM:  Not done and not indicated.  EXTREMITIES:  Lower extremities show trace to 1+ edema.  Pulses are  intact distally.  NEUROLOGICALLY:  She is alert and oriented and has no observable motor  defects.   IMPRESSION:  1. Abnormal density involving the right middle lobe with a question      raised of endobronchial obstruction after recent pneumonia.  I      suspect this is mucus plugging, but with her prior x-rays showing a      question of calcified lymph nodes, I would also wonder about      broncholithiasis or a right middle lobe syndrome.  At this point in      time, we will do a followup chest CT in the event this was a mucus      plug that is now resolved, after antibiotics, and has returned to      baseline.  2. Cough that I suspect is secondary to upper airway irritation and      anxiety.  Her description of the cough really does not sound as      though it is lower airway in origin, though I cannot exclude this.   PLAN:  1. CT scan of the chest for followup.  2. Voice rest with lozenges, and I have  asked the patient to not clear      her throat.  3. I will contact the patient after the above CT is done.     Barbaraann Share, MD,FCCP  Electronically Signed    [facility]/MedQ  DD: 09/18/2006  DT: 09/18/2006  Job #: [id number]   cc:   Sharlot Gowda, M.D.

## 2011-02-23 NOTE — Assessment & Plan Note (Signed)
[facility]                             PULMONARY OFFICE NOTE   NAME:[name], [name]                       MRN:          [id number]  DATE:10/30/2006                            DOB:          May 15, 1933    SUBJECTIVE:  [name] comes in today for followup after her recent  bronchoscopy for right middle lobe airspace disease with what appears to  be endobronchial narrowing and occlusion.  At bronchoscopy she was found  to have a significantly narrowed and nearly occluded right middle lobe  bronchus that was very inflamed.  Specimens obtained from this area  showed acute and chronic inflammation and the culture grew a few  colonies of methicillin sensitive staph aureus.  There was no evidence  of malignancy noted.  In the interim, the patient has developed a  classic right middle lobe pneumonia with fever and pleuritic chest pain.  She was placed on p.o. Zithro through the emergency room and feels that  she has improved some, but continues to have some mild pleuritic chest  pain as well as productive cough.  Again, all of her symptoms have been  improving.  She comes in today for further discussion of management at  this time.   PHYSICAL EXAMINATION:  GENERAL:  She is an obese black female in no  acute distress.  Blood pressure 118/78, pulse 107, temperature is 100.  O2 saturation on  room air is 92%.  Weight is 194 pounds.  CHEST:  Reveals a few crackles in her right lower lung zone and right  middle lung zone.  She has no wheezes.  CARDIAC:  Reveals regular rate and rhythm.  It should be noted that the  patient is not using accessory muscles nor does she have increased work  of breathing.  Cardiac exam shows mildly tachycardic rhythm but it is  regular.   IMPRESSION:  Right middle lobe endobronchial abnormality with air space  disease distally and now a right middle lobe pneumonia.  I really think  the patient has a right middle lobe syndrome but I  also would consider  the possibility of broncholithiasis related to her calcified  lymphadenopathy.  She does have changes of old granulomatous disease on  her CT scan.  I really think that she is going to continue to have  recurrent infections in this area unless we are able to clear the  obstruction.  I have told her that we did not get cancer on her initial  biopsy and lavage; however, I cannot 100% exclude this.  I think at this  point in time since the patient is a relatively healthy 77 year old with  no known cardiac or kidney disease, that we should consider a right  middle lobe resection.  I would like to get an opinion from the  cardiothoracic surgeons and also have them discuss this with the  patient.   PLAN:  1. We will change her azithromycin to Levaquin 750 mg q. day for the      next 7 days.  She is to  call me if she is not improving and certainly if she is getting      worse.  2. We will refer her to Dr. Karle Plumber for possible right middle      lobe resection.     Barbaraann Share, MD,FCCP  Electronically Signed    [facility]/MedQ  DD: 10/30/2006  DT: 10/30/2006  Job #: [id number]   cc:   Ines Bloomer, M.D.  Sharlot Gowda, M.D.

## 2011-02-23 NOTE — H&P (Signed)
[name], [name]                ACCOUNT NO.:  [id number]   MEDICAL RECORD NO.:  [id number]           PATIENT TYPE:   LOCATION:                                 FACILITY:   PHYSICIAN:  Ines Bloomer, M.D. DATE OF BIRTH:  11-12-1932   DATE OF ADMISSION:  11/28/2006  DATE OF DISCHARGE:                              HISTORY & PHYSICAL   HISTORY OF PRESENT ILLNESS:  This 77 year old patient was found to have  intermittent pneumonia for the past several years that were treated.  She had CT scan and she had an opacification in her right middle lobe.  Bronchoscopy showed acute inflammation.  She is referred for here for  resection of a right middle lobe inflammatory mass.  Pulmonary function  tests revealed an FVC of 1.2 and FEV1 at 1.1.  She has had no  hemoptysis, no fever or chills.  She said she had some Blumenberg-tinged  sputum.   PAST MEDICAL HISTORY:   MEDICATIONS:  1. Ecotrin 81 mg a day.  2. Lopressor 25 mg a day.  3. Antara 130 mg a day.  4. Naprosyn 500 mg twice a day.  5. Temazepam 0.5 mg as needed.  6. Tylenol.   She also has dyslipidemia.  She had a hysterectomy and been treated for  anxiety disorder.   ALLERGIES:  SHE HAS NO ALLERGIES.   FAMILY HISTORY:  Positive for hypertension, myocardial infarction,  cardiac disease.   SOCIAL HISTORY:  She is a widow, has 2 daughters, works part time as a  Lawyer.  She does not smoke, does not drink alcohol on a regular basis.   REVIEW OF SYSTEMS:  CARDIAC:  No angina or atrial fibrillation.  GENERAL:  Her weight has been stable.  PULMONARY:  See History of  Present Illness.  She has had a cough.  GI:  No nausea or vomiting,  constipation or diarrhea.  No GERD.  GU:  No dysuria or frequent  urination, or kidney disease.  VASCULAR:  No claudication, DVT, TIA.  NEUROLOGICAL:  No headaches, blackouts or seizures.  MUSCULOSKELETAL:  She has arthritis with joint pains.  PSYCHIATRIC:  She has been treated  for nervousness and  depression.  EYE/ENT:  No change in her eyesight or  hearing.  HEMATOLOGICAL:  No problems with bleeding or anemia.   PHYSICAL EXAM:  VITAL SIGNS:  On physical exam her blood pressure is  120/80, pulse 98, respirations 18.  Sats are 96%.  HEAD:  Atraumatic.  EYES:  Pupils equal, react to light and accommodation.  Extraocular  movements are normal.  EARS:  Tympanic membranes are intact.  NOSE:  There is no septal deviation.  THROAT:  Is without lesions.  NECK:  Supple without thyromegaly.  There is no supraclavicular or  axillary adenopathy.  CHEST:  Is clear to auscultation and percussion.  HEART:  Regular sinus rhythm.  No murmurs.  ABDOMEN:  Is soft with no hepatosplenomegaly.  EXTREMITIES:  Pulses are 2+ with no clubbing or edema.  NEUROLOGICAL:  She is oriented x3.  Sensory and motor intact.  Cranial  nerves  II-XII are intact.   IMPRESSION:  1. Right middle lobe syndrome.  2. Dyslipidemia.  3. Anxiety disorder.   PLAN:  Right VATS lobectomy.      Ines Bloomer, M.D.  Electronically Signed     DPB/MEDQ  D:  11/25/2006  T:  11/26/2006  Job:  [id number]

## 2011-02-23 NOTE — Discharge Summary (Signed)
[name], [name]                ACCOUNT NO.:  [id number]   MEDICAL RECORD NO.:  [id number]          PATIENT TYPE:  INP   LOCATION:  2036                         FACILITY:  [facility]   PHYSICIAN:  Ines Bloomer, M.D. DATE OF BIRTH:  1933-06-17   DATE OF ADMISSION:  11/28/2006  DATE OF DISCHARGE:                               DISCHARGE SUMMARY   ANTICIPATED DATE OF DISCHARGE:  December 04, 2006 or December 05, 2006.   ADMISSION DIAGNOSIS:  Right middle lobe syndrome.   DISCHARGE/SECONDARY DIAGNOSES:  1. 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  2. Postoperative hypokalemia, supplemented.  3. Postoperative acute blood loss anemia.  4. Postoperative bronchitis.  5. Dyslipidemia.  6. Hysterectomy.  7. Anxiety disorder.  8. ALLERGY TO ADVIL.  9. A history of hernia repair.   PROCEDURES:  Right video-assisted thoracoscopic surgery, right  thoracotomy, a right middle lobectomy, and right adhesions and lymph  node sampling, Dr. Norton Blizzard, November 28, 2006.   BRIEF HISTORY:  [name] is a 77 year old female who was found to have  intermittent pneumonia for the past several years that was treated with  various antibiotics.  She had a CT scan that showed opacification of her  right middle lobe.  Bronchoscopy showed acute inflammation.  She has  been followed by Dr. Marcelyn Bruins.  She was referred for resection of  right middle lobe inflammatory mass.  Preoperative pulmonary function  test revealed and FVC of 1.2 and FEV1 of 1.1.  She had denied  hemoptysis, fever, chills and only complained of some Ernandez-tinged  sputum.   HOSPITAL COURSE:  November 28, 2006, [name] was electively admitted  to [facility] and underwent a right middle lobectomy.  As  mentioned, pathology showed acute and chronic inflammation and  organizing pneumonia.  Lymph nodes and  flora were all negative for  tumor.  At this point, AFB and fungal cultures were also negative.  Postoperatively, Pulmonology was consulted to assist with management.  She initially was cared for in the surgical intensive care unit.  Overall, she had a relatively uneventful hospital course but was treated  with empiric Fortaz for postoperative fevers as high as 102.  She had  urine and respiratory blood cultures which were all negative.  It was  ultimately felt she had bronchitis.  She was also monitored for anemia  and hypokalemia.  She had received potassium supplement for hypokalemia  and is started on Nu-Iron for her anemia.  At the time of this  dictation, her hemoglobin and hematocrit are 7.9 and 22.9 respectively.  Follow-up CBC has been ordered prior to discharge and is further  decreased and she will most likely undergo a transfusion of packed red  blood cells.  Otherwise, her hemoglobin her hematocrit are increasingly.  We anticipate that she will be discharged with at least a couple more  months of Nu-Iron.  In regards to her pulmonary status, she initially  received albuterol nebs which  were weaned over several days.  All chest  tubes were discontinued by February 25 with follow-up chest x-ray  showing no pneumothorax as well as right lower lobe atelectasis versus  infiltrate.  She also had signs of subacute air.  Aggressive pulmonary  toilet was encouraged.  At this point her antibiotics have been  discontinued as she has been afebrile.  Other vitals have also been  stable, and her oxygen saturation has been 97% on room air.  Pain was  initially managed with __________ device and PCA, both of which has been  discontinued and her pain has been controlled on tramadol as needed.  Postoperatively, she was seen by both Physical Therapy and Occupational  Therapy.  Physical Therapy did recommend a home health physical  therapist, and a 3-in-1 rolling walker were also recommended.    If there is no significant change in [name]'s status and her labs  show improvement in her hemoglobin and she remains afebrile, it is  anticipated she will be ready for discharge home on February 27 or  December 05, 2006.  At the time of dictation, her incisions are healing  and no signs of infection.   DISCHARGE MEDICATIONS:  1. Aspirin 81 mg p.o. daily.  2. Metoprolol 25 mg p.o. daily.  3. Antara 130 mg p.o. q.h.s.  4. Multivitamin p.o. daily.  5. Clonazepam 0.5 mg as needed for anxiety per her home regimen.  6. Nu-Iron 150 mg capsule daily.  7. Ultram 50 mg 1-2 tablets p.o. q.6h. p.r.n. pain.   DISCHARGE INSTRUCTIONS:  She is to increase her activity slowly,  continue daily walking and breathing exercises, she is to avoid heavy  lifting or driving for the next 3 weeks.  She may shower and clean her  incision gently with soap and water.  She is to notify Dr. Edwyna Shell if she  develops fever greater than 101, redness or drainage from her incision,  or increased shortness of breath.  She should follow a low-sodium Heart  Healthy diet.   FOLLOWUP:  She should follow up with Dr. Edwyna Shell at the [facility] office in 7-  10 days.  Her skin staples should be removed at this appointment as  well.  Prior to her appointment with Dr. Edwyna Shell, she should go to  [facility] and obtain a chest x-ray.  She should call and  schedule a 2-3-week followup with Dr. Marcelyn Bruins.      Jerold Coombe, P.A.      Ines Bloomer, M.D.  Electronically Signed    AWZ/MEDQ  D:  12/03/2006  T:  12/04/2006  Job:  [id number]   cc:   Barbaraann Share, MD,FCCP  Sharlot Gowda, M.D.

## 2011-02-23 NOTE — Procedures (Signed)
[place]  Patient:    [name], [name] Visit Number: [id number] MRN: [id number]          Service Type: END Location: ENDO Attending Physician:  Charna Elizabeth Proc. Date: 05/29/01 Adm. Date:  05/29/2001 Disc. Date: 05/29/2001   CC:         Heather Roberts, M.D.   Procedure Report  DATE OF BIRTH:  06-Nov-1932.  PROCEDURE:  Colonoscopy with snare polypectomy x 1.  ENDOSCOPIST:  Anselmo Rod, M.D.  INSTRUMENT USED:  Pediatric Olympus colonoscope.  INDICATION FOR PROCEDURE:  Family history of colon cancer in a brother in a 43 year old African-American female.  Rule out colonic polyps, masses, hemorrhoids, etc.  Screening colonoscopy is being performed.  PREPROCEDURE PREPARATION:  Informed consent was procured from the patient. The patient was fasted for eight hours prior to the procedure and prepped with a bottle of magnesium citrate and a gallon of NuLytely the night prior to the procedure.  PREPROCEDURE PHYSICAL:  VITAL SIGNS:  The patient had stable vital signs.  NECK:  Supple.  CHEST:  Clear to auscultation.  S1, S2 regular.  ABDOMEN:  Soft with normal bowel sounds.  DESCRIPTION OF PROCEDURE:  The patient was placed in the left lateral decubitus position and sedated with 70 mg of Demerol and 5 mg of Versed intravenously.  Once the patient was adequately sedate and maintained on low-flow oxygen and continuous cardiac monitoring, the Olympus video colonoscope was advanced from the rectum to the cecum without difficulty.  The patient had a fairly good prep except for some residual stool in the cecum. The patients position was changed from the left lateral to the supine and the right lateral position to adequately visualize the cecal base.  A polyp was seen at 40 cm, removed by snare polypectomy.  The patient tolerated the procedure well without complications.  There was left-sided diverticulosis present.  IMPRESSION: 1.  Left-sided diverticulosis. 2. A 5-6 mm polyp (sessile) snared from 40 cm. 3. Some residual stool in the right colon.  Very small lesions could have    been missed.  RECOMMENDATIONS: 1. Await pathology results. 2. Outpatient follow-up in the next two weeks. 3. High-fiber diet. 4. Repeat colorectal cancer screening depending on the pathology results. Attending Physician:  Charna Elizabeth DD:  05/29/01 TD:  05/31/01 Job: [id number]

## 2011-02-23 NOTE — Op Note (Signed)
NAMEJEANAE, [name]                ACCOUNT NO.:  [id number]   MEDICAL RECORD NO.:  [id number]          PATIENT TYPE:  AMB   LOCATION:  CARD                         FACILITY:  [facility]   PHYSICIAN:  Barbaraann Share, MD,FCCPDATE OF BIRTH:  13-Sep-1933   DATE OF PROCEDURE:  09/25/2006  DATE OF DISCHARGE:                               OPERATIVE REPORT   PROCEDURE:  Flexible fiberoptic bronchoscopy with bronchoalveolar lavage  and bronchial brushings.   INDICATIONS:  Right middle lobe infiltrate/pulmonary collapse of unknown  etiology.  There is a suggestion on the CT scan an endobronchial  process.   OPERATOR:  Barbaraann Share, MD,FCCP.   ANESTHESIA:  Versed 7 mg in various aliquots IV as well as Demerol 50 mg  IV.   DESCRIPTION:  After obtaining informed consent and under close  cardiopulmonary monitoring the above, preop anesthesia was given and the  fiberoptic scope was passed into the right naris; and into the posterior  pharynx where there were no lesions or other abnormalities seen.  The  scope was then passed to the level of the vocal cords where there was  good mobility and no abnormal process.  The scope was then passed into  the trachea where it was examined along its entire length down to the  level of the carina all of which was normal.  The left tracheobronchial  tree was examined serially in the subsegmental level with no  abnormalities being found.   Attention was then paid to the right tracheobronchial tree where the  right upper lobe; and the right lower lobes were all within normal  limits.  There was some mild inflammation in the superior segment of the  right lower lobe.  The right middle lobe bronchus was then examined; and  was very edematous and erythematous with significant mucosal changes at  the orifice.  Upon entering this area, there was a lot of oozing of  blood from the area that was very difficult for visualization.  The  inferior segment of the right  middle lobe appeared to be very swollen,  but somewhat patent.  The superior segment of right middle lobe was  nearly obstructed, by what appeared to be extrinsic compression; and  also a large mucous/phleb plug.  Bronchoalveolar lavage was done from  this area; and clearly the inferior segment was patent; but it was very  difficult to visualize whether the superior segment was patent.  This  was sent for the usual cytologic and bacteriologic evaluation.   Bronchial brush was then advanced into both the superior and inferior  segments where brushings were done; and sent for cytology.  The  procedure was very difficult because of a lot of coughing from the  patient upon entering the right middle lobe; and also because of the  bleeding from that area.  By the procedure of the procedure it was  really unclear whether the right middle lobe bronchus was totally open  or not.  Overall the patient tolerated the procedure well; and there  were complications.      Barbaraann Share, MD,FCCP  Electronically  Signed    [facility]/MEDQ  D:  09/25/2006  T:  09/25/2006  Job:  [id number]

## 2011-02-23 NOTE — Assessment & Plan Note (Signed)
[facility]                             PULMONARY OFFICE NOTE   NAME:[name], [name]                       MRN:          [id number]  DATE:01/07/2007                            DOB:          02/17/1933    SUBJECTIVE:  [name] comes in today for pulmonary followup after her  recent lobectomy over at [facility] with Dr. Edwyna Shell.  The patient was  found to have a right middle lobe syndrome and was having recurrent  infections.  It was recommended that she undergo surgical resection, and  did have this on February 21st of this year.  The patient was found to  have a lot of fibrosis as well as chronic inflammation and organizing  pneumonia in this area.  She has done very, very well since her surgery  and comes in today for followup.  She has had no fevers, chills, sweats,  nor purulent mucous.  She feels that she is slowly improving, and that  her exercise tolerance is getting back to normal baseline.  There is no  significant cough.   PHYSICAL EXAMINATION:  BP is 104/66, pulse 94, temperature is 97.6,  weight is 181 pounds, O2 saturation on room air is 96%.  CHEST:  Reveals mild right basilar crackles, otherwise it is clear.  CARDIAC EXAM:  Reveals regular rate and rhythm.   IMPRESSION:  Recent right middle lobectomy for right middle lobe  syndrome.  The patient has done very, very well with this, and I suspect  that she will do much better than she has in the past.  I told her that  I would be very surprised if she had any recurrence of her ongoing  pulmonary infections now that this has been removed.   PLAN:  1. Increase exercise and mobility as much as possible.  2. Keep followup with Dr. Edwyna Shell until release from a surgical      standpoint.  3. Patient will follow up with me in 4 months or sooner if there are      problems.  If she is doing well at that time, she will no longer      need pulmonary followup.     Barbaraann Share, MD,FCCP  Electronically Signed    KMC/MedQ  DD: 01/09/2007  DT: 01/09/2007  Job #: [id number]   cc:   Sharlot Gowda, M.D.  Ines Bloomer, M.D.

## 2011-04-12 ENCOUNTER — Other Ambulatory Visit: Payer: Self-pay | Admitting: Endocrinology

## 2011-04-12 DIAGNOSIS — E049 Nontoxic goiter, unspecified: Secondary | ICD-10-CM

## 2011-04-16 ENCOUNTER — Ambulatory Visit
Admission: RE | Admit: 2011-04-16 | Discharge: 2011-04-16 | Disposition: A | Discharge status: Home / Self Care | Payer: Medicare Other | Source: Ambulatory Visit | Attending: Endocrinology | Admitting: Endocrinology

## 2011-04-16 DIAGNOSIS — E049 Nontoxic goiter, unspecified: Secondary | ICD-10-CM

## 2011-05-18 ENCOUNTER — Encounter: Payer: Self-pay | Admitting: Pulmonary Disease

## 2011-05-21 ENCOUNTER — Ambulatory Visit: Payer: Medicare Other | Admitting: Pulmonary Disease

## 2011-05-28 ENCOUNTER — Ambulatory Visit: Payer: Medicare Other | Admitting: Pulmonary Disease

## 2011-06-04 ENCOUNTER — Encounter: Payer: Self-pay | Admitting: Pulmonary Disease

## 2011-06-05 ENCOUNTER — Ambulatory Visit (INDEPENDENT_AMBULATORY_CARE_PROVIDER_SITE_OTHER): Payer: Medicare Other | Admitting: Pulmonary Disease

## 2011-06-05 ENCOUNTER — Encounter: Payer: Self-pay | Admitting: Pulmonary Disease

## 2011-06-05 VITALS — BP 102/58 | HR 68 | Temp 97.9°F | Ht 62.0 in | Wt 216.6 lb

## 2011-06-05 DIAGNOSIS — R0602 Shortness of breath: Secondary | ICD-10-CM

## 2011-06-05 NOTE — Patient Instructions (Signed)
Continue to work on weight loss and some type of exercise program Will recheck the pressures in your heart with a sound wave test, and call you with results.

## 2011-06-05 NOTE — Progress Notes (Signed)
  Subjective:    Patient ID: [name], female    DOB: June 27, 1933, 77 y.o.   MRN: [id number]  HPI The patient comes in today for followup of her chronic dyspnea on exertion.  This is felt to be primarily due to her morbid obesity and deconditioning, as well as an element of diastolic heart failure.  She had very minimal airflow obstruction on her pulmonary function studies, and was tried on a LABA/ICS for a period of time.  She saw no difference in her breathing, and has seen no change once she discontinued the medication.  The only other finding on her workup was mildly elevated RV pressures on echo.  This has not been rechecked since 2010, and will need to do this.  The patient has continued to be fairly sedentary, with no big change in her weight.  She states that her dyspnea on exertion is at her usual baseline.  She denies any significant cough or congestion.   Review of Systems  Constitutional: Negative for fever and unexpected weight change.  HENT: Negative for ear pain, nosebleeds, congestion, sore throat, rhinorrhea, sneezing, trouble swallowing, dental problem, postnasal drip and sinus pressure.   Eyes: Positive for redness and itching.  Respiratory: Positive for shortness of breath. Negative for cough, chest tightness and wheezing.   Cardiovascular: Positive for leg swelling. Negative for palpitations.  Gastrointestinal: Negative for nausea and vomiting.  Genitourinary: Negative for dysuria.  Musculoskeletal: Positive for joint swelling.  Skin: Negative for rash.  Neurological: Negative for headaches.  Hematological: Bruises/bleeds easily.  Psychiatric/Behavioral: Negative for dysphoric mood. The patient is not nervous/anxious.        Objective:   Physical Exam Obese female in no acute distress Nose without purulence or discharge noted Chest totally clear to auscultation Cardiac exam with regular rate rhythm Lower extremities with 1-2+ edema, no cyanosis Alert and  oriented, moves all 4 extremities.       Assessment & Plan:

## 2011-06-05 NOTE — Assessment & Plan Note (Signed)
The patient feels that her dyspnea on exertion is at a stable baseline currently.  It is no better or worse from her last visit.  She did not see a difference with her inhaler regimen, therefore I think her minimal obstructive disease really has nothing to do with her symptoms.  I continue to believe that her obesity and deconditioning are the main issues, however she did have mild elevation of right ventricular pressures in 2010 by echo.  I think it would be worthwhile to recheck this, to make sure that she does not have worsening pulmonary hypertension.  The other possible explanation for her shortness of breath is her diastolic dysfunction, and she clearly has significant lower extremity edema which has room for improvement.

## 2011-06-07 ENCOUNTER — Telehealth: Payer: Self-pay | Admitting: Pulmonary Disease

## 2011-06-07 NOTE — Telephone Encounter (Signed)
Spoke with patient and she stated that she hasn't been seen by cardiologist for over 3 years. 2D Echo cancelled at [facility] and R/S at [facility] for Tues 06/12/11 at 2:00 pm. Pt will need to arrive at 1:45. Contacted patient and advised patient that since she had seen [place] before we would rather that she have this done at [place]. Pt is aware that 2D has been cancelled at [facility] and that her appointment is on Tues. 06/12/11 at 2:00 at [place], [address] in [place].  Order faxed to [facility] at [phone].

## 2011-06-07 NOTE — Telephone Encounter (Signed)
Pt should have been scheduled to see [facility]. Spoke with Bjorn Loser and she states she is going to take care of this and forward to her.

## 2011-06-08 ENCOUNTER — Other Ambulatory Visit (HOSPITAL_COMMUNITY): Payer: Medicare Other | Admitting: Radiology

## 2011-06-09 HISTORY — PX: TRANSTHORACIC ECHOCARDIOGRAM: SHX275

## 2011-06-09 HISTORY — PX: NM MYOVIEW LTD: HXRAD82

## 2011-07-04 ENCOUNTER — Telehealth: Payer: Self-pay | Admitting: Pulmonary Disease

## 2011-07-04 NOTE — Telephone Encounter (Signed)
Megan, please let pt know that her echo looked ok.  She had mild pulmonary hypertension (elevated pressure in blood vessels in lungs), better than her last echo in 2010.  This will need to be checked in a year.  It also showed she had a thick heart muscle that doesn';t relax well, similar to the past.  This is what is causing her fluid issues.  She needs to address this with primary md.

## 2011-07-05 NOTE — Telephone Encounter (Signed)
Called and spoke with pt. Pt aware of Echo results and KC's recs.  Pt verbalized understanding and denied any questions.

## 2011-07-16 ENCOUNTER — Encounter: Payer: Self-pay | Admitting: Pulmonary Disease

## 2011-07-16 DIAGNOSIS — Z9889 Other specified postprocedural states: Secondary | ICD-10-CM

## 2011-07-16 DIAGNOSIS — Z902 Acquired absence of lung [part of]: Secondary | ICD-10-CM

## 2011-07-16 DIAGNOSIS — I10 Essential (primary) hypertension: Secondary | ICD-10-CM

## 2011-07-16 DIAGNOSIS — E0590 Thyrotoxicosis, unspecified without thyrotoxic crisis or storm: Secondary | ICD-10-CM

## 2011-07-16 DIAGNOSIS — Z8719 Personal history of other diseases of the digestive system: Secondary | ICD-10-CM

## 2011-07-16 DIAGNOSIS — E7800 Pure hypercholesterolemia, unspecified: Secondary | ICD-10-CM

## 2011-07-16 DIAGNOSIS — Z90710 Acquired absence of both cervix and uterus: Secondary | ICD-10-CM

## 2011-07-17 LAB — DIFFERENTIAL
Basophils Absolute: 0
Basophils Relative: 0
Eosinophils Absolute: 0.1 — ABNORMAL LOW
Eosinophils Relative: 1
Lymphocytes Relative: 9 — ABNORMAL LOW
Lymphs Abs: 0.8
Monocytes Absolute: 0.2
Monocytes Relative: 3
Neutro Abs: 7.4
Neutrophils Relative %: 87 — ABNORMAL HIGH

## 2011-07-17 LAB — URINALYSIS, ROUTINE W REFLEX MICROSCOPIC
Bilirubin Urine: NEGATIVE
Glucose, UA: NEGATIVE
Hgb urine dipstick: NEGATIVE
Ketones, ur: NEGATIVE
Nitrite: NEGATIVE
Protein, ur: NEGATIVE
Specific Gravity, Urine: 1.022
Urobilinogen, UA: 1
pH: 6.5

## 2011-07-17 LAB — CBC
HCT: 41.8
Hemoglobin: 14.4
MCHC: 34.3
MCV: 82.4
Platelets: 350
RBC: 5.08
RDW: 14.4
WBC: 8.5

## 2011-07-17 LAB — COMPREHENSIVE METABOLIC PANEL
ALT: 12
AST: 27
Albumin: 3.7
Alkaline Phosphatase: 70
BUN: 11
CO2: 23
Calcium: 8.7
Chloride: 102
Creatinine, Ser: 0.75
GFR calc Af Amer: >60
GFR calc non Af Amer: >60
Glucose, Bld: 111 — ABNORMAL HIGH
Potassium: 4
Sodium: 132 — ABNORMAL LOW
Total Bilirubin: 0.7
Total Protein: 8.2

## 2011-07-17 LAB — LIPASE, BLOOD: Lipase: 29

## 2011-11-20 DIAGNOSIS — J209 Acute bronchitis, unspecified: Secondary | ICD-10-CM | POA: Diagnosis not present

## 2011-11-20 DIAGNOSIS — N182 Chronic kidney disease, stage 2 (mild): Secondary | ICD-10-CM | POA: Diagnosis not present

## 2011-11-20 DIAGNOSIS — Z79899 Other long term (current) drug therapy: Secondary | ICD-10-CM | POA: Diagnosis not present

## 2011-11-20 DIAGNOSIS — J3489 Other specified disorders of nose and nasal sinuses: Secondary | ICD-10-CM | POA: Diagnosis not present

## 2011-11-20 DIAGNOSIS — E559 Vitamin D deficiency, unspecified: Secondary | ICD-10-CM | POA: Diagnosis not present

## 2011-11-20 DIAGNOSIS — I129 Hypertensive chronic kidney disease with stage 1 through stage 4 chronic kidney disease, or unspecified chronic kidney disease: Secondary | ICD-10-CM | POA: Diagnosis not present

## 2011-11-21 DIAGNOSIS — J3489 Other specified disorders of nose and nasal sinuses: Secondary | ICD-10-CM | POA: Diagnosis not present

## 2011-11-21 DIAGNOSIS — N182 Chronic kidney disease, stage 2 (mild): Secondary | ICD-10-CM | POA: Diagnosis not present

## 2011-11-21 DIAGNOSIS — I129 Hypertensive chronic kidney disease with stage 1 through stage 4 chronic kidney disease, or unspecified chronic kidney disease: Secondary | ICD-10-CM | POA: Diagnosis not present

## 2011-11-21 DIAGNOSIS — J209 Acute bronchitis, unspecified: Secondary | ICD-10-CM | POA: Diagnosis not present

## 2012-01-11 DIAGNOSIS — M810 Age-related osteoporosis without current pathological fracture: Secondary | ICD-10-CM | POA: Insufficient documentation

## 2012-01-11 DIAGNOSIS — M816 Localized osteoporosis [Lequesne]: Secondary | ICD-10-CM | POA: Insufficient documentation

## 2012-01-11 DIAGNOSIS — I10 Essential (primary) hypertension: Secondary | ICD-10-CM | POA: Insufficient documentation

## 2012-01-11 DIAGNOSIS — Z8719 Personal history of other diseases of the digestive system: Secondary | ICD-10-CM | POA: Insufficient documentation

## 2012-01-11 DIAGNOSIS — E0590 Thyrotoxicosis, unspecified without thyrotoxic crisis or storm: Secondary | ICD-10-CM | POA: Insufficient documentation

## 2012-01-11 DIAGNOSIS — Z9889 Other specified postprocedural states: Secondary | ICD-10-CM | POA: Insufficient documentation

## 2012-01-11 DIAGNOSIS — Z902 Acquired absence of lung [part of]: Secondary | ICD-10-CM | POA: Insufficient documentation

## 2012-01-11 DIAGNOSIS — E7800 Pure hypercholesterolemia, unspecified: Secondary | ICD-10-CM | POA: Insufficient documentation

## 2012-01-11 DIAGNOSIS — Z90710 Acquired absence of both cervix and uterus: Secondary | ICD-10-CM | POA: Insufficient documentation

## 2012-01-17 ENCOUNTER — Ambulatory Visit: Payer: Self-pay | Admitting: Obstetrics and Gynecology

## 2012-01-18 DIAGNOSIS — M899 Disorder of bone, unspecified: Secondary | ICD-10-CM | POA: Diagnosis not present

## 2012-01-18 DIAGNOSIS — Z1231 Encounter for screening mammogram for malignant neoplasm of breast: Secondary | ICD-10-CM | POA: Diagnosis not present

## 2012-01-18 DIAGNOSIS — M949 Disorder of cartilage, unspecified: Secondary | ICD-10-CM | POA: Diagnosis not present

## 2012-02-01 ENCOUNTER — Encounter: Payer: Self-pay | Admitting: Obstetrics and Gynecology

## 2012-02-01 ENCOUNTER — Ambulatory Visit (INDEPENDENT_AMBULATORY_CARE_PROVIDER_SITE_OTHER): Payer: Medicare Other | Admitting: Obstetrics and Gynecology

## 2012-02-01 VITALS — BP 118/70 | Ht 61.0 in | Wt 212.0 lb

## 2012-02-01 DIAGNOSIS — N9489 Other specified conditions associated with female genital organs and menstrual cycle: Secondary | ICD-10-CM

## 2012-02-01 DIAGNOSIS — R300 Dysuria: Secondary | ICD-10-CM

## 2012-02-01 DIAGNOSIS — N949 Unspecified condition associated with female genital organs and menstrual cycle: Secondary | ICD-10-CM

## 2012-02-01 DIAGNOSIS — Z124 Encounter for screening for malignant neoplasm of cervix: Secondary | ICD-10-CM | POA: Diagnosis not present

## 2012-02-01 DIAGNOSIS — Z01419 Encounter for gynecological examination (general) (routine) without abnormal findings: Secondary | ICD-10-CM

## 2012-02-01 DIAGNOSIS — R309 Painful micturition, unspecified: Secondary | ICD-10-CM

## 2012-02-01 LAB — POCT URINALYSIS DIPSTICK
Bilirubin, UA: NEGATIVE
Blood, UA: NEGATIVE
Glucose, UA: NEGATIVE
Ketones, UA: NEGATIVE
Leukocytes, UA: NEGATIVE
Nitrite, UA: NEGATIVE
Protein, UA: 1
Spec Grav, UA: 1.015
Urobilinogen, UA: NEGATIVE
pH, UA: 7

## 2012-02-01 MED ORDER — NYSTATIN-TRIAMCINOLONE 100000-0.1 UNIT/GM-% EX OINT: TOPICAL_OINTMENT | Freq: Three times a day (TID) | CUTANEOUS | Status: AC | PRN

## 2012-02-01 NOTE — Progress Notes (Signed)
The patient is not taking hormone replacement therapy The patient  is taking a Calcium supplement. Post-menopausal bleeding:no  Last Pap: was normal March  2011 Last mammogram: was normal April  2013 Last DEXA scan : 2013 and normal per pt Last colonoscopy:was normal unsure 2011  Urinary symptoms: none Normal bowel movements: Yes Reports abuse at home: No   Subjective:    [name] is a 78 y.o. female G4P4 who presents for annual exam. S/t hysterectomy The patient c/o vaginal irritation/burning for 3 weeks without discharge  The following portions of the patient's history were reviewed and updated as appropriate: allergies, current medications, past family history, past medical history, past social history, past surgical history and problem list.  Review of Systems Pertinent items are noted in HPI. Gastrointestinal:No change in bowel habits, no abdominal pain, no rectal bleeding Genitourinary:negative for dysuria, frequency, hematuria, nocturia and urinary incontinence    Objective:     BP 118/70  Ht 5\' 1"  (1.549 m)  Wt 212 lb (96.163 kg)  BMI 40.06 kg/m2  Weight:  Wt Readings from Last 1 Encounters:  02/01/12 212 lb (96.163 kg)     BMI: Body mass index is 40.06 kg/(m^2). General Appearance: Alert, appropriate appearance for age. No acute distress HEENT: Grossly normal Neck / Thyroid: Supple, no masses, nodes or enlargement Lungs: clear to auscultation bilaterally Back: No CVA tenderness Breast Exam: No masses or nodes.No dimpling, nipple retraction or discharge. Cardiovascular: Regular rate and rhythm. S1, S2, no murmur Gastrointestinal: Soft, non-tender, no masses or organomegaly Pelvic Exam: Vulva and vagina appear normal. Bimanual exam reveals normal adnexa. Uterus is absent. Rectovaginal: normal rectal, no masses Lymphatic Exam: Non-palpable nodes in neck, clavicular, axillary, or inguinal regions Skin: no rash or abnormalities Neurologic: Normal gait and  speech, no tremor  Psychiatric: Alert and oriented, appropriate affect.    Urinalysis:Not done      Assessment:    Normal gyn exam    Plan:   U/A: normal  KOH prep:pH=5  normal Mammogram.   Follow-up:  for annual exam

## 2012-03-19 DIAGNOSIS — I129 Hypertensive chronic kidney disease with stage 1 through stage 4 chronic kidney disease, or unspecified chronic kidney disease: Secondary | ICD-10-CM | POA: Diagnosis not present

## 2012-03-19 DIAGNOSIS — R635 Abnormal weight gain: Secondary | ICD-10-CM | POA: Diagnosis not present

## 2012-03-19 DIAGNOSIS — Z79899 Other long term (current) drug therapy: Secondary | ICD-10-CM | POA: Diagnosis not present

## 2012-03-19 DIAGNOSIS — N182 Chronic kidney disease, stage 2 (mild): Secondary | ICD-10-CM | POA: Diagnosis not present

## 2012-03-31 DIAGNOSIS — H5710 Ocular pain, unspecified eye: Secondary | ICD-10-CM | POA: Diagnosis not present

## 2012-03-31 DIAGNOSIS — H02849 Edema of unspecified eye, unspecified eyelid: Secondary | ICD-10-CM | POA: Diagnosis not present

## 2012-04-09 DIAGNOSIS — E049 Nontoxic goiter, unspecified: Secondary | ICD-10-CM | POA: Diagnosis not present

## 2012-04-09 DIAGNOSIS — E7800 Pure hypercholesterolemia, unspecified: Secondary | ICD-10-CM | POA: Diagnosis not present

## 2012-04-09 DIAGNOSIS — E0590 Thyrotoxicosis, unspecified without thyrotoxic crisis or storm: Secondary | ICD-10-CM | POA: Diagnosis not present

## 2012-04-14 DIAGNOSIS — H0019 Chalazion unspecified eye, unspecified eyelid: Secondary | ICD-10-CM | POA: Diagnosis not present

## 2012-05-21 ENCOUNTER — Telehealth: Payer: Self-pay | Admitting: Pulmonary Disease

## 2012-05-21 NOTE — Telephone Encounter (Signed)
I spoke with pt and she stated she wanted to know who KC referred her to last year (? Cardiology) regarding her echo. I advised her according to the note she was to discuss this with her PCP. She stated she did but it has been a year and wanted to make an appt. Pt was transferred upfront to schedule OV

## 2012-06-27 DIAGNOSIS — E782 Mixed hyperlipidemia: Secondary | ICD-10-CM | POA: Diagnosis not present

## 2012-06-27 DIAGNOSIS — E663 Overweight: Secondary | ICD-10-CM | POA: Diagnosis not present

## 2012-06-27 DIAGNOSIS — I10 Essential (primary) hypertension: Secondary | ICD-10-CM | POA: Diagnosis not present

## 2012-06-27 DIAGNOSIS — R0602 Shortness of breath: Secondary | ICD-10-CM | POA: Diagnosis not present

## 2012-07-08 DIAGNOSIS — I270 Primary pulmonary hypertension: Secondary | ICD-10-CM | POA: Diagnosis not present

## 2012-07-08 DIAGNOSIS — R0602 Shortness of breath: Secondary | ICD-10-CM | POA: Diagnosis not present

## 2012-08-01 DIAGNOSIS — R0602 Shortness of breath: Secondary | ICD-10-CM | POA: Diagnosis not present

## 2012-08-01 DIAGNOSIS — E782 Mixed hyperlipidemia: Secondary | ICD-10-CM | POA: Diagnosis not present

## 2012-10-23 DIAGNOSIS — Z79899 Other long term (current) drug therapy: Secondary | ICD-10-CM | POA: Diagnosis not present

## 2012-10-23 DIAGNOSIS — J069 Acute upper respiratory infection, unspecified: Secondary | ICD-10-CM | POA: Diagnosis not present

## 2012-10-30 DIAGNOSIS — H40019 Open angle with borderline findings, low risk, unspecified eye: Secondary | ICD-10-CM | POA: Diagnosis not present

## 2012-10-30 DIAGNOSIS — H04129 Dry eye syndrome of unspecified lacrimal gland: Secondary | ICD-10-CM | POA: Diagnosis not present

## 2012-11-24 DIAGNOSIS — R0602 Shortness of breath: Secondary | ICD-10-CM | POA: Diagnosis not present

## 2012-11-27 DIAGNOSIS — H43399 Other vitreous opacities, unspecified eye: Secondary | ICD-10-CM | POA: Diagnosis not present

## 2012-11-27 DIAGNOSIS — H524 Presbyopia: Secondary | ICD-10-CM | POA: Diagnosis not present

## 2012-11-27 DIAGNOSIS — H04129 Dry eye syndrome of unspecified lacrimal gland: Secondary | ICD-10-CM | POA: Diagnosis not present

## 2012-11-27 DIAGNOSIS — H35369 Drusen (degenerative) of macula, unspecified eye: Secondary | ICD-10-CM | POA: Diagnosis not present

## 2012-11-27 DIAGNOSIS — H2510 Age-related nuclear cataract, unspecified eye: Secondary | ICD-10-CM | POA: Diagnosis not present

## 2012-11-27 DIAGNOSIS — H4011X0 Primary open-angle glaucoma, stage unspecified: Secondary | ICD-10-CM | POA: Diagnosis not present

## 2012-12-04 DIAGNOSIS — E782 Mixed hyperlipidemia: Secondary | ICD-10-CM | POA: Diagnosis not present

## 2012-12-04 DIAGNOSIS — R079 Chest pain, unspecified: Secondary | ICD-10-CM | POA: Diagnosis not present

## 2013-02-11 DIAGNOSIS — R1310 Dysphagia, unspecified: Secondary | ICD-10-CM | POA: Diagnosis not present

## 2013-03-20 DIAGNOSIS — Z1231 Encounter for screening mammogram for malignant neoplasm of breast: Secondary | ICD-10-CM | POA: Diagnosis not present

## 2013-04-03 DIAGNOSIS — E785 Hyperlipidemia, unspecified: Secondary | ICD-10-CM | POA: Diagnosis not present

## 2013-04-03 DIAGNOSIS — Z0000 Encounter for general adult medical examination without abnormal findings: Secondary | ICD-10-CM | POA: Diagnosis not present

## 2013-04-03 DIAGNOSIS — Z79899 Other long term (current) drug therapy: Secondary | ICD-10-CM | POA: Diagnosis not present

## 2013-04-03 DIAGNOSIS — E559 Vitamin D deficiency, unspecified: Secondary | ICD-10-CM | POA: Diagnosis not present

## 2013-04-03 DIAGNOSIS — N182 Chronic kidney disease, stage 2 (mild): Secondary | ICD-10-CM | POA: Diagnosis not present

## 2013-04-03 DIAGNOSIS — I129 Hypertensive chronic kidney disease with stage 1 through stage 4 chronic kidney disease, or unspecified chronic kidney disease: Secondary | ICD-10-CM | POA: Diagnosis not present

## 2013-04-03 DIAGNOSIS — Z0100 Encounter for examination of eyes and vision without abnormal findings: Secondary | ICD-10-CM | POA: Diagnosis not present

## 2013-04-15 DIAGNOSIS — E0590 Thyrotoxicosis, unspecified without thyrotoxic crisis or storm: Secondary | ICD-10-CM | POA: Diagnosis not present

## 2013-04-15 DIAGNOSIS — E7800 Pure hypercholesterolemia, unspecified: Secondary | ICD-10-CM | POA: Diagnosis not present

## 2013-04-15 DIAGNOSIS — E049 Nontoxic goiter, unspecified: Secondary | ICD-10-CM | POA: Diagnosis not present

## 2013-05-25 DIAGNOSIS — H0019 Chalazion unspecified eye, unspecified eyelid: Secondary | ICD-10-CM | POA: Diagnosis not present

## 2013-05-25 DIAGNOSIS — L719 Rosacea, unspecified: Secondary | ICD-10-CM | POA: Diagnosis not present

## 2013-06-23 DIAGNOSIS — L719 Rosacea, unspecified: Secondary | ICD-10-CM | POA: Diagnosis not present

## 2013-06-23 DIAGNOSIS — L723 Sebaceous cyst: Secondary | ICD-10-CM | POA: Diagnosis not present

## 2013-06-23 DIAGNOSIS — L821 Other seborrheic keratosis: Secondary | ICD-10-CM | POA: Diagnosis not present

## 2013-07-27 DIAGNOSIS — L821 Other seborrheic keratosis: Secondary | ICD-10-CM | POA: Diagnosis not present

## 2013-07-27 DIAGNOSIS — I129 Hypertensive chronic kidney disease with stage 1 through stage 4 chronic kidney disease, or unspecified chronic kidney disease: Secondary | ICD-10-CM | POA: Diagnosis not present

## 2013-07-27 DIAGNOSIS — N182 Chronic kidney disease, stage 2 (mild): Secondary | ICD-10-CM | POA: Diagnosis not present

## 2013-07-28 DIAGNOSIS — L719 Rosacea, unspecified: Secondary | ICD-10-CM | POA: Diagnosis not present

## 2013-07-28 DIAGNOSIS — L723 Sebaceous cyst: Secondary | ICD-10-CM | POA: Diagnosis not present

## 2013-07-28 DIAGNOSIS — L821 Other seborrheic keratosis: Secondary | ICD-10-CM | POA: Diagnosis not present

## 2013-09-09 DIAGNOSIS — M1710 Unilateral primary osteoarthritis, unspecified knee: Secondary | ICD-10-CM | POA: Diagnosis not present

## 2013-09-09 DIAGNOSIS — M545 Low back pain, unspecified: Secondary | ICD-10-CM | POA: Diagnosis not present

## 2013-09-23 DIAGNOSIS — L723 Sebaceous cyst: Secondary | ICD-10-CM | POA: Diagnosis not present

## 2013-09-23 DIAGNOSIS — L719 Rosacea, unspecified: Secondary | ICD-10-CM | POA: Diagnosis not present

## 2013-10-15 DIAGNOSIS — E0590 Thyrotoxicosis, unspecified without thyrotoxic crisis or storm: Secondary | ICD-10-CM | POA: Diagnosis not present

## 2013-11-19 DIAGNOSIS — L723 Sebaceous cyst: Secondary | ICD-10-CM | POA: Diagnosis not present

## 2013-12-07 DIAGNOSIS — H40019 Open angle with borderline findings, low risk, unspecified eye: Secondary | ICD-10-CM | POA: Diagnosis not present

## 2013-12-07 DIAGNOSIS — H524 Presbyopia: Secondary | ICD-10-CM | POA: Diagnosis not present

## 2013-12-07 DIAGNOSIS — H2510 Age-related nuclear cataract, unspecified eye: Secondary | ICD-10-CM | POA: Diagnosis not present

## 2013-12-07 DIAGNOSIS — L719 Rosacea, unspecified: Secondary | ICD-10-CM | POA: Diagnosis not present

## 2013-12-07 DIAGNOSIS — H43399 Other vitreous opacities, unspecified eye: Secondary | ICD-10-CM | POA: Diagnosis not present

## 2013-12-14 ENCOUNTER — Ambulatory Visit (INDEPENDENT_AMBULATORY_CARE_PROVIDER_SITE_OTHER): Payer: Medicare Other | Admitting: Cardiology

## 2013-12-14 ENCOUNTER — Encounter: Payer: Self-pay | Admitting: Cardiology

## 2013-12-14 VITALS — BP 128/70 | HR 63 | Ht 62.0 in | Wt 206.8 lb

## 2013-12-14 DIAGNOSIS — E7800 Pure hypercholesterolemia, unspecified: Secondary | ICD-10-CM

## 2013-12-14 DIAGNOSIS — R0789 Other chest pain: Secondary | ICD-10-CM

## 2013-12-14 DIAGNOSIS — R0602 Shortness of breath: Secondary | ICD-10-CM | POA: Diagnosis not present

## 2013-12-14 DIAGNOSIS — I10 Essential (primary) hypertension: Secondary | ICD-10-CM | POA: Diagnosis not present

## 2013-12-14 NOTE — Patient Instructions (Signed)
Your physician recommends that you schedule a follow-up appointment as needed.   Keep working on exercising!

## 2013-12-16 ENCOUNTER — Encounter: Payer: Self-pay | Admitting: Cardiology

## 2013-12-16 DIAGNOSIS — R0789 Other chest pain: Secondary | ICD-10-CM | POA: Insufficient documentation

## 2013-12-16 NOTE — Assessment & Plan Note (Signed)
Well-controlled on current regimen. Deferred to PCP. Not on significant medication regimen beyond metoprolol (beta-1 selective)

## 2013-12-16 NOTE — Assessment & Plan Note (Addendum)
Essentially stable baseline for her. This is probably a combination of body habitus history of lung disease and lobectomy. Dyspnea she is describing began following her surgery.  I simply recommended a standard exercise program and gradually picking up the medics that she doesn't want to maximize her exercise tolerance.

## 2013-12-16 NOTE — Assessment & Plan Note (Signed)
Most of which is describing are whether they're rest or exertion. Atypical sounding features for cardiac disease. Or reproducible exam. An incline to his musculoskeletal in nature. It they're not that troublesome to her, we will just simply treat with expectant monitoring.

## 2013-12-16 NOTE — Progress Notes (Signed)
PATIENT: [name] MRN: [id number]  DOB: 21-Apr-1933   DOV:12/16/2013 PCP: Maximino Greenland, MD  Clinic Note: Chief Complaint  Patient presents with  . Annual Exam    C/o chest pain and shortness of breath on exertion, swelling.   HPI: [name] is a 80 y.o.  female with a PMH below who presents today for almost 18 month followup. I last saw her in September 2013. As you recall she is a very pleasant and now 80 year old moderate to morbidly obese woman in what was initially evaluated for exertional dyspnea and chest discomfort in 2010. She's had a relatively normal ischemic evaluation. She does have a history of partial right middle lobe lobectomy for significant pneumonia in 2008.  Interval History: She comes back in today because she's been noticing that she's been having some discomfort in her chest that is not necessarily associated with either rest or exertion. It more sharp type tightness in her chest that is worse if she has any respiratory illness it is a cold. She has her baseline exertional dyspnea which really recorded her is not changed much. He denies any PND or orthopnea. If she really gets going fast on stairs or doing lots of chores as she may get short of breath.  Part of her fatigue is now related to that I see her back recently and it hasn't been able to do the exercises she had been doing. She had been doing quite well and has actually lost 10 pounds since last visit. She says her weight was lower than it currently is, stating that she has put some weight back on.  She denies any resting dyspnea. She has the chest discomfort is very tortuous on exertion or or breath. He denies any PND, orthopnea. No syncope or near syncope. No rapid or irregular heartbeats. No claudication. No TIA/amaurosis fugax symptoms. No melena, hematochezia or hematuria.   Past Medical History  Diagnosis Date  . Bronchiectasis     isolated to RML; status post right middle lobe partial resection.   . Yeast infection   . Hypertension   . Hypothyroidism   . H/O osteoporosis   . OSA (obstructive sleep apnea)    Prior Cardiac Evaluation and Past Surgical History: Past Surgical History  Procedure Laterality Date  . Lung removal, partial  208    RML  . Abdominal hysterectomy  1974  . Hernia repair  1975  . Transthoracic echocardiogram  September 2012    Mild concentric LVH. Normal EF with impaired relaxation. Mildly elevated RV pressures of 30 and 40 mmHg.  Marland Kitchen Nm myoview ltd  September 2012    Normal EF. No ischemia or infarction.    Allergies  Allergen Reactions  . Ibuprofen   . Naproxen Sodium     Current Outpatient Prescriptions  Medication Sig Dispense Refill  . acetaminophen (TYLENOL) 500 MG tablet Take by mouth every 6 (six) hours as needed.       . Ascorbic Acid (VITAMIN C PO) Take 1 tablet by mouth daily.        Marland Kitchen aspirin 81 MG tablet Take 81 mg by mouth daily.        . Cholecalciferol (VITAMIN D) 2000 UNITS CAPS Take 1 capsule by mouth. Three times per wk       . clonazePAM (KLONOPIN) 0.5 MG tablet Take 0.5 mg by mouth daily as needed.        . furosemide (LASIX) 20 MG tablet Take 20 mg by mouth daily.        Marland Kitchen  Glucosamine-Vitamin D (GLUCOSAMINE PLUS VITAMIN D) 1500-200 MG-UNIT TABS Take 1 tablet by mouth daily.        Marland Kitchen ibandronate (BONIVA) 150 MG tablet Take 150 mg by mouth every 30 (thirty) days.        . methimazole (TAPAZOLE) 5 MG tablet Take 5 mg by mouth daily.      . metoprolol tartrate (LOPRESSOR) 25 MG tablet Take 25 mg by mouth daily.        . Multiple Vitamin (MULTIVITAMIN) tablet Take 1 tablet by mouth daily.      . Omega-3 Fatty Acids (FISH OIL) 1000 MG CAPS Take 1 capsule by mouth 3 (three) times daily.        . potassium chloride SA (K-DUR,KLOR-CON) 20 MEQ tablet Take 20 mEq by mouth 2 (two) times daily.       No current facility-administered medications for this visit.    History   Social History Narrative   She is a widowed mother of 2,  grandmother for, a great-grandmother 74.   She has been trying to do more exercise than before, but has been injured recently by her back pain.   She never smoked, doesn't drink alcohol.    ROS: A comprehensive Review of Systems - Negative except Mild cramping and musculoskeletal aching otherwise essentially negative.  PHYSICAL EXAM BP 128/70  Pulse 63  Ht 5\' 2"  (1.575 m)  Wt 206 lb 12.8 oz (93.804 kg)  BMI 37.81 kg/m2 General: She is a very pleasant woman. She actually looks younger than her stated age. A&) x3, answers questions appropriately. She is well nourished, moderately to severely obese  NEUROLOGIC: CN II-XII grossly intact with normal strength and normal gait.  HEENT: NCAT, EOMI, MMM. Neck: Supple; no LAN, JVD, or carotid bruit. No notable JVD Lungs: CTAB, nonlabored, normal effort, good air movement. Normal to percussion bilaterally. No wheezes, rales, or rhonchi.  Heart: RRR, normal S1, S2. No M/R/G. Difficult to palpate PMI due to body habitus.   Mild tenderness along the costochondral margin in the left upper chest. Abdomen: Soft/NT/ND/NABS. No HSM. Truncal obesity.  Extremities: Minimal edema with 2+ pulses throughout. Gait is normal .   HCW:CBJSEGBTD today: Yes Rate:63 , Rhythm: NSR, essentially normal EKG;   Recent Labs: Checked by PCP. I do not have labs.  ASSESSMENT / PLAN: Musculoskeletal chest pain Most of which is describing are whether they're rest or exertion. Atypical sounding features for cardiac disease. Or reproducible exam. An incline to his musculoskeletal in nature. It they're not that troublesome to her, we will just simply treat with expectant monitoring.  DYSPNEA Essentially stable baseline for her. This is probably a combination of body habitus history of lung disease and lobectomy. Dyspnea she is describing began following her surgery.  I simply recommended a standard exercise program and gradually picking up the medics that she doesn't want to  maximize her exercise tolerance.  Hypercholesteremia No longer on Triliptix. She is just on fish oil. Deferred to PCP  HTN (hypertension) Well-controlled on current regimen. Deferred to PCP. Not on significant medication regimen beyond metoprolol (beta-1 selective)    Orders Placed This Encounter  Procedures  . EKG 12-Lead   Meds ordered this encounter  Medications  . Multiple Vitamin (MULTIVITAMIN) tablet    Sig: Take 1 tablet by mouth daily.  . methimazole (TAPAZOLE) 5 MG tablet    Sig: Take 5 mg by mouth daily.  . potassium chloride SA (K-DUR,KLOR-CON) 20 MEQ tablet    Sig: Take 20  mEq by mouth 2 (two) times daily.   Followup: When necessary  Lanson Randle W. Ellyn Hack, M.D., M.S. Interventional Cardiology [facility]

## 2013-12-16 NOTE — Assessment & Plan Note (Signed)
No longer on Triliptix. She is just on fish oil. Deferred to PCP

## 2013-12-24 DIAGNOSIS — E785 Hyperlipidemia, unspecified: Secondary | ICD-10-CM | POA: Diagnosis not present

## 2013-12-24 DIAGNOSIS — N182 Chronic kidney disease, stage 2 (mild): Secondary | ICD-10-CM | POA: Diagnosis not present

## 2013-12-24 DIAGNOSIS — I129 Hypertensive chronic kidney disease with stage 1 through stage 4 chronic kidney disease, or unspecified chronic kidney disease: Secondary | ICD-10-CM | POA: Diagnosis not present

## 2013-12-24 DIAGNOSIS — R0602 Shortness of breath: Secondary | ICD-10-CM | POA: Diagnosis not present

## 2013-12-28 DIAGNOSIS — E0590 Thyrotoxicosis, unspecified without thyrotoxic crisis or storm: Secondary | ICD-10-CM | POA: Diagnosis not present

## 2014-01-13 ENCOUNTER — Ambulatory Visit (INDEPENDENT_AMBULATORY_CARE_PROVIDER_SITE_OTHER): Payer: Medicare Other | Admitting: Pulmonary Disease

## 2014-01-13 ENCOUNTER — Encounter: Payer: Self-pay | Admitting: Pulmonary Disease

## 2014-01-13 VITALS — BP 112/76 | HR 61 | Temp 97.8°F | Ht 62.0 in | Wt 202.8 lb

## 2014-01-13 DIAGNOSIS — R0602 Shortness of breath: Secondary | ICD-10-CM | POA: Diagnosis not present

## 2014-01-13 NOTE — Progress Notes (Signed)
   Subjective:    Patient ID: [name], female    DOB: March 26, 1933, 80 y.o.   MRN: [id number]  HPI The patient comes in today for followup of her dyspnea on exertion. I have not seen her in a while, but she has had an extensive cardiopulmonary workup in the past with very little being found. She did have some diastolic dysfunction, and only minimal airflow obstruction that did not respond clinically to a course of LABA/ICS. She is getting ready to start a water aerobics program, and wanted to see if it was safe to do this and if there was anything else that needed to be done beforehand. She has lost 14 pounds since the last visit, and clearly feels that her breathing has improved since doing so.   Review of Systems  Constitutional: Negative for fever and unexpected weight change.  HENT: Negative for congestion, dental problem, ear pain, nosebleeds, postnasal drip, rhinorrhea, sinus pressure, sneezing, sore throat and trouble swallowing.   Eyes: Negative for redness and itching.  Respiratory: Positive for shortness of breath. Negative for cough, chest tightness and wheezing.   Cardiovascular: Negative for palpitations and leg swelling.  Gastrointestinal: Negative for nausea and vomiting.  Genitourinary: Negative for dysuria.  Musculoskeletal: Negative for joint swelling.  Skin: Negative for rash.  Neurological: Negative for headaches.  Hematological: Does not bruise/bleed easily.  Psychiatric/Behavioral: Negative for dysphoric mood. The patient is not nervous/anxious.        Objective:   Physical Exam Overweight female in no acute distress Nose without purulence or discharge noted Neck without lymphadenopathy or thyromegaly Chest with totally clear breath sounds, no wheezing Cardiac exam with regular rate and rhythm, 2/6 systolic murmur Lower extremities with 1+ edema, no cyanosis Alert and oriented, moves all 4 extremities.       Assessment & Plan:

## 2014-01-13 NOTE — Patient Instructions (Signed)
Continue working on weight loss.  You are doing well. Ok to start your water aerobics and any other physical activity If you are losing weight and exercising regularly, and you feel your breathing is not slowly improving, please call us.

## 2014-01-13 NOTE — Assessment & Plan Note (Signed)
The patient has a long history of dyspnea on exertion, and has had an extensive cardiopulmonary workup in the past with nothing specific being found. She does have diastolic dysfunction, as well as minimal airflow obstruction which did not respond clinically to a LABA/ICS. She is now wanting to enroll in water aerobics, and wanted to see if it was safe for her, and if there was anything else that needed to be done. She clearly has improved from a breathing standpoint since she has lost weight. I have asked her to continue with weight loss and her exercise program, and if she does not see further improvement in her breathing, I would be happy to reevaluate.

## 2014-01-21 DIAGNOSIS — L255 Unspecified contact dermatitis due to plants, except food: Secondary | ICD-10-CM | POA: Diagnosis not present

## 2014-02-12 DIAGNOSIS — R32 Unspecified urinary incontinence: Secondary | ICD-10-CM | POA: Diagnosis not present

## 2014-02-12 DIAGNOSIS — M899 Disorder of bone, unspecified: Secondary | ICD-10-CM | POA: Diagnosis not present

## 2014-02-12 DIAGNOSIS — R3915 Urgency of urination: Secondary | ICD-10-CM | POA: Diagnosis not present

## 2014-02-12 DIAGNOSIS — Z124 Encounter for screening for malignant neoplasm of cervix: Secondary | ICD-10-CM | POA: Diagnosis not present

## 2014-03-25 DIAGNOSIS — Z1231 Encounter for screening mammogram for malignant neoplasm of breast: Secondary | ICD-10-CM | POA: Diagnosis not present

## 2014-03-25 DIAGNOSIS — M949 Disorder of cartilage, unspecified: Secondary | ICD-10-CM | POA: Diagnosis not present

## 2014-03-25 DIAGNOSIS — M899 Disorder of bone, unspecified: Secondary | ICD-10-CM | POA: Diagnosis not present

## 2014-03-30 DIAGNOSIS — M1610 Unilateral primary osteoarthritis, unspecified hip: Secondary | ICD-10-CM | POA: Diagnosis not present

## 2014-03-30 DIAGNOSIS — M25559 Pain in unspecified hip: Secondary | ICD-10-CM | POA: Diagnosis not present

## 2014-03-30 DIAGNOSIS — M169 Osteoarthritis of hip, unspecified: Secondary | ICD-10-CM | POA: Diagnosis not present

## 2014-03-30 DIAGNOSIS — M545 Low back pain, unspecified: Secondary | ICD-10-CM | POA: Diagnosis not present

## 2014-03-31 DIAGNOSIS — M169 Osteoarthritis of hip, unspecified: Secondary | ICD-10-CM | POA: Diagnosis not present

## 2014-03-31 DIAGNOSIS — M1610 Unilateral primary osteoarthritis, unspecified hip: Secondary | ICD-10-CM | POA: Diagnosis not present

## 2014-03-31 DIAGNOSIS — M25559 Pain in unspecified hip: Secondary | ICD-10-CM | POA: Diagnosis not present

## 2014-04-15 DIAGNOSIS — E0590 Thyrotoxicosis, unspecified without thyrotoxic crisis or storm: Secondary | ICD-10-CM | POA: Diagnosis not present

## 2014-04-15 DIAGNOSIS — E049 Nontoxic goiter, unspecified: Secondary | ICD-10-CM | POA: Diagnosis not present

## 2014-04-15 DIAGNOSIS — E7800 Pure hypercholesterolemia, unspecified: Secondary | ICD-10-CM | POA: Diagnosis not present

## 2014-05-06 DIAGNOSIS — R7309 Other abnormal glucose: Secondary | ICD-10-CM | POA: Diagnosis not present

## 2014-05-06 DIAGNOSIS — Z0000 Encounter for general adult medical examination without abnormal findings: Secondary | ICD-10-CM | POA: Diagnosis not present

## 2014-05-06 DIAGNOSIS — R413 Other amnesia: Secondary | ICD-10-CM | POA: Diagnosis not present

## 2014-05-06 DIAGNOSIS — R946 Abnormal results of thyroid function studies: Secondary | ICD-10-CM | POA: Diagnosis not present

## 2014-05-06 DIAGNOSIS — N182 Chronic kidney disease, stage 2 (mild): Secondary | ICD-10-CM | POA: Diagnosis not present

## 2014-05-06 DIAGNOSIS — E7800 Pure hypercholesterolemia, unspecified: Secondary | ICD-10-CM | POA: Diagnosis not present

## 2014-05-06 DIAGNOSIS — I129 Hypertensive chronic kidney disease with stage 1 through stage 4 chronic kidney disease, or unspecified chronic kidney disease: Secondary | ICD-10-CM | POA: Diagnosis not present

## 2014-05-06 DIAGNOSIS — E559 Vitamin D deficiency, unspecified: Secondary | ICD-10-CM | POA: Diagnosis not present

## 2014-06-29 DIAGNOSIS — M1610 Unilateral primary osteoarthritis, unspecified hip: Secondary | ICD-10-CM | POA: Diagnosis not present

## 2014-06-29 DIAGNOSIS — M25559 Pain in unspecified hip: Secondary | ICD-10-CM | POA: Diagnosis not present

## 2014-06-29 DIAGNOSIS — M169 Osteoarthritis of hip, unspecified: Secondary | ICD-10-CM | POA: Diagnosis not present

## 2014-08-09 ENCOUNTER — Encounter: Payer: Self-pay | Admitting: Pulmonary Disease

## 2014-10-14 DIAGNOSIS — E0590 Thyrotoxicosis, unspecified without thyrotoxic crisis or storm: Secondary | ICD-10-CM | POA: Diagnosis not present

## 2014-10-21 DIAGNOSIS — D649 Anemia, unspecified: Secondary | ICD-10-CM | POA: Diagnosis not present

## 2014-10-21 DIAGNOSIS — E0590 Thyrotoxicosis, unspecified without thyrotoxic crisis or storm: Secondary | ICD-10-CM | POA: Diagnosis not present

## 2014-11-15 DIAGNOSIS — Z79899 Other long term (current) drug therapy: Secondary | ICD-10-CM | POA: Diagnosis not present

## 2014-11-15 DIAGNOSIS — I129 Hypertensive chronic kidney disease with stage 1 through stage 4 chronic kidney disease, or unspecified chronic kidney disease: Secondary | ICD-10-CM | POA: Diagnosis not present

## 2014-11-15 DIAGNOSIS — N182 Chronic kidney disease, stage 2 (mild): Secondary | ICD-10-CM | POA: Diagnosis not present

## 2014-11-15 DIAGNOSIS — E559 Vitamin D deficiency, unspecified: Secondary | ICD-10-CM | POA: Diagnosis not present

## 2014-11-18 DIAGNOSIS — D649 Anemia, unspecified: Secondary | ICD-10-CM | POA: Diagnosis not present

## 2014-11-29 DIAGNOSIS — M25531 Pain in right wrist: Secondary | ICD-10-CM | POA: Diagnosis not present

## 2014-12-17 DIAGNOSIS — M25531 Pain in right wrist: Secondary | ICD-10-CM | POA: Diagnosis not present

## 2015-01-10 DIAGNOSIS — K5730 Diverticulosis of large intestine without perforation or abscess without bleeding: Secondary | ICD-10-CM | POA: Diagnosis not present

## 2015-01-10 DIAGNOSIS — Z86010 Personal history of colonic polyps: Secondary | ICD-10-CM | POA: Diagnosis not present

## 2015-01-10 DIAGNOSIS — E0590 Thyrotoxicosis, unspecified without thyrotoxic crisis or storm: Secondary | ICD-10-CM | POA: Diagnosis not present

## 2015-01-26 DIAGNOSIS — L719 Rosacea, unspecified: Secondary | ICD-10-CM | POA: Diagnosis not present

## 2015-01-27 DIAGNOSIS — Z86010 Personal history of colonic polyps: Secondary | ICD-10-CM | POA: Diagnosis not present

## 2015-01-27 DIAGNOSIS — D179 Benign lipomatous neoplasm, unspecified: Secondary | ICD-10-CM | POA: Diagnosis not present

## 2015-01-27 DIAGNOSIS — K635 Polyp of colon: Secondary | ICD-10-CM | POA: Diagnosis not present

## 2015-01-27 DIAGNOSIS — K5730 Diverticulosis of large intestine without perforation or abscess without bleeding: Secondary | ICD-10-CM | POA: Diagnosis not present

## 2015-01-27 DIAGNOSIS — D123 Benign neoplasm of transverse colon: Secondary | ICD-10-CM | POA: Diagnosis not present

## 2015-01-27 DIAGNOSIS — Z1211 Encounter for screening for malignant neoplasm of colon: Secondary | ICD-10-CM | POA: Diagnosis not present

## 2015-01-27 DIAGNOSIS — D120 Benign neoplasm of cecum: Secondary | ICD-10-CM | POA: Diagnosis not present

## 2015-02-14 ENCOUNTER — Ambulatory Visit (INDEPENDENT_AMBULATORY_CARE_PROVIDER_SITE_OTHER): Payer: Medicare Other | Admitting: Cardiology

## 2015-02-14 VITALS — BP 112/76 | HR 67 | Ht 61.0 in | Wt 201.3 lb

## 2015-02-14 DIAGNOSIS — I10 Essential (primary) hypertension: Secondary | ICD-10-CM

## 2015-02-14 DIAGNOSIS — E669 Obesity, unspecified: Secondary | ICD-10-CM

## 2015-02-14 DIAGNOSIS — R0602 Shortness of breath: Secondary | ICD-10-CM | POA: Diagnosis not present

## 2015-02-14 DIAGNOSIS — E7800 Pure hypercholesterolemia, unspecified: Secondary | ICD-10-CM

## 2015-02-14 DIAGNOSIS — R600 Localized edema: Secondary | ICD-10-CM

## 2015-02-14 DIAGNOSIS — E0590 Thyrotoxicosis, unspecified without thyrotoxic crisis or storm: Secondary | ICD-10-CM | POA: Diagnosis not present

## 2015-02-14 NOTE — Patient Instructions (Signed)
NO CHANGE IN CURRENT MEDICATIONS.   Your physician wants you to follow-up in [place].  You will receive a reminder letter in the mail two months in advance. If you don't receive a letter, please call our office to schedule the follow-up appointment.

## 2015-02-14 NOTE — Progress Notes (Signed)
PATIENT: [name] MRN: [id number]  DOB: [birth year]   DOV:02/16/2015 PCP: Maximino Greenland, MD  Clinic Note: Chief Complaint  Patient presents with  . Shortness of Breath    since right lung lobe removal from pneumonia  . Edema    ankles and feet, pt states if she drives or is sitting up for a long time   HPI: LOUNETTE Smith is a 81 y.o.  female with a PMH below who presents today for almost 14 month followup. I last saw her in March of 2015.. As you recall she is a very pleasant and now 81 year old moderate to morbidly obese woman in what was initially evaluated for exertional dyspnea and chest discomfort in 2010. She's had a relatively normal ischemic evaluation. She does have a history of partial right middle lobe lobectomy for significant pneumonia in 2008. In March of 2015, she was noting some atypical sounding chest discomfort.this was quite clearly a non-anginal in nature more musculoskeletal.  Interval History:   She presents today really without any complaints.she says she is "doing great. ". She still has her baseline shortness of breath that she's had since her lung surgery. She also has a little fatigue but that's better. She gets short of breath if she goes up and down 20 steps or exerts a lot. She has had some swelling up and on her feet but not today. She has not needed any since of having chest tightness pressure with rest or exertion. No dyspnea on this strenuous exertion. No particular symptoms of PND orthopnea or edema. No syncope or near syncope. No rapid or irregular heartbeats. No claudication. No TIA/amaurosis fugax symptoms. No melena, hematochezia or hematuria.   Past Medical History  Diagnosis Date  . Bronchiectasis     isolated to RML; status post right middle lobe partial resection.  . Yeast infection   . Hypertension   . Hypothyroidism   . H/O osteoporosis   . OSA (obstructive sleep apnea)    Prior Cardiac Evaluation and Past Surgical History: Past  Surgical History  Procedure Laterality Date  . Lung removal, partial  208    RML  . Abdominal hysterectomy  1974  . Hernia repair  1975  . Transthoracic echocardiogram  September 2012    Mild concentric LVH. Normal EF with impaired relaxation. Mildly elevated RV pressures of 30 and 40 mmHg.  Marland Kitchen Nm myoview ltd  September 2012    Normal EF. No ischemia or infarction.    Allergies  Allergen Reactions  . Ibuprofen   . Naproxen Sodium     Current Outpatient Prescriptions  Medication Sig Dispense Refill  . acetaminophen (TYLENOL) 650 MG CR tablet Take 650 mg by mouth every 8 (eight) hours as needed for pain.    . Ascorbic Acid (VITAMIN C PO) Take 1 tablet by mouth daily.      Marland Kitchen aspirin 81 MG tablet Take 81 mg by mouth daily.      . Cholecalciferol (VITAMIN D) 2000 UNITS CAPS Take 1 capsule by mouth. Three times per wk     . clonazePAM (KLONOPIN) 0.5 MG tablet Take 0.5 mg by mouth daily as needed.      . Fish Oil-Krill Oil (PA FISH OIL/KRILL PO) Take by mouth. 300 krill oil and 90mg  fish oil    . furosemide (LASIX) 20 MG tablet Take 20 mg by mouth daily.      . Glucosamine-Vitamin D (GLUCOSAMINE PLUS VITAMIN D) 1500-200 MG-UNIT TABS Take 1 tablet by mouth  daily.      . methimazole (TAPAZOLE) 5 MG tablet Take 5 mg by mouth daily.    . metoprolol tartrate (LOPRESSOR) 25 MG tablet Take 25 mg by mouth daily.      . Multiple Vitamin (MULTIVITAMIN) tablet Take 1 tablet by mouth daily.    . potassium chloride SA (K-DUR,KLOR-CON) 20 MEQ tablet Take 20 mEq by mouth 2 (two) times daily.     No current facility-administered medications for this visit.    History   Social History Narrative   She is a widowed mother of 16, grandmother for, a great-grandmother 32.   She has been trying to do more exercise than before, but has been injured recently by her back pain.   She never smoked, doesn't drink alcohol.    ROS: A comprehensive Review of Systems - Was performed    Review of Systems    Constitutional: Negative for fever and chills.  Respiratory: Positive for shortness of breath (Mostly exertional.). Negative for cough.   Cardiovascular: Negative for claudication.  Musculoskeletal: Positive for myalgias and joint pain (Bilateral Knee OA - Sees Dr. Lorin Mercy.).  Neurological: Positive for dizziness (Occasional positional dizziness). Negative for headaches.  Psychiatric/Behavioral: Negative for depression. The patient is nervous/anxious (But no recent attacks).   All other systems reviewed and are negative.  Michaelneed for glasses and get a better Wt Readings from Last 3 Encounters:  02/14/15 91.309 kg (201 lb 4.8 oz)  01/13/14 91.989 kg (202 lb 12.8 oz)  12/14/13 93.804 kg (206 lb 12.8 oz)    PHYSICAL EXAM BP 112/76 mmHg  Pulse 67  Ht 5\' 1"  (1.549 m)  Wt 91.309 kg (201 lb 4.8 oz)  BMI 38.05 kg/m2 General: She is a very pleasant woman. She actually looks younger than her stated age. A&) x3, answers questions appropriately. She is well nourished, moderately to severely obese  NEUROLOGIC: CN II-XII grossly intact with normal strength and normal gait.  HEENT: NCAT, EOMI, MMM. Neck: Supple; no LAN, JVD, or carotid bruit. No notable JVD Lungs: CTAB, nonlabored, normal effort, good air movement. Normal to percussion bilaterally. No wheezes, rales, or rhonchi.  Heart: RRR, normal S1, S2. No M/R/G. Difficult to palpate PMI due to body habitus.   Mild tenderness along the costochondral margin in the left upper chest. Abdomen: Soft/NT/ND/NABS. No HSM. Truncal obesity.  Extremities: Minimal edema with 2+ pulses throughout. Gait is normal .   FIE:PPIRJJOAC today: Yes Rate:67 , Rhythm: NSR, essentially normal EKG;   Recent Labs: Checked by PCP. I do not have labs.  ASSESSMENT / PLAN: Problem List Items Addressed This Visit    DYSPNEA (Chronic)    Probably not much for in nature. More functional pulmonary status standpoint.      Relevant Orders   EKG 12-Lead (Completed)    Essential hypertension - Primary (Chronic)    Well-controlled on low-dose metoprolol. Continue current regimen. No bradycardia      Relevant Orders   EKG 12-Lead (Completed)   Hypercholesteremia    Monitored by PCP. She is now taking fish oil. I don't have recent labs. Or goals for lipids should be LDL less than 130.      Relevant Orders   EKG 12-Lead (Completed)   Lower leg edema    Mild edema. Readily controlled with her Lasix.      Relevant Orders   EKG 12-Lead (Completed)   Obesity (BMI 30.0-34.9) (Chronic)    The patient understands the need to lose weight with diet and exercise.  We have discussed specific strategies for this.         followup 1 year  [place] W. Ellyn Hack, M.D., M.S. Interventional Cardiology [facility]

## 2015-02-16 ENCOUNTER — Encounter: Payer: Self-pay | Admitting: Cardiology

## 2015-02-16 DIAGNOSIS — E66811 Obesity, class 1: Secondary | ICD-10-CM | POA: Insufficient documentation

## 2015-02-16 DIAGNOSIS — E669 Obesity, unspecified: Secondary | ICD-10-CM | POA: Insufficient documentation

## 2015-02-16 DIAGNOSIS — R600 Localized edema: Secondary | ICD-10-CM | POA: Insufficient documentation

## 2015-02-16 NOTE — Assessment & Plan Note (Signed)
Well-controlled on low-dose metoprolol. Continue current regimen. No bradycardia

## 2015-02-16 NOTE — Assessment & Plan Note (Signed)
Mild edema. Readily controlled with her Lasix.

## 2015-02-16 NOTE — Assessment & Plan Note (Signed)
Probably not much for in nature. More functional pulmonary status standpoint.

## 2015-02-16 NOTE — Assessment & Plan Note (Signed)
The patient understands the need to lose weight with diet and exercise. We have discussed specific strategies for this.  

## 2015-02-16 NOTE — Assessment & Plan Note (Signed)
Monitored by PCP. She is now taking fish oil. I don't have recent labs. Or goals for lipids should be LDL less than 130.

## 2015-02-28 ENCOUNTER — Emergency Department (HOSPITAL_COMMUNITY): Payer: Medicare Other

## 2015-02-28 ENCOUNTER — Encounter (HOSPITAL_COMMUNITY): Payer: Self-pay

## 2015-02-28 ENCOUNTER — Emergency Department (HOSPITAL_COMMUNITY)
Admission: EM | Admit: 2015-02-28 | Discharge: 2015-02-28 | Disposition: A | Discharge status: Home / Self Care | Payer: Medicare Other | Attending: Emergency Medicine | Admitting: Emergency Medicine

## 2015-02-28 DIAGNOSIS — R112 Nausea with vomiting, unspecified: Secondary | ICD-10-CM

## 2015-02-28 DIAGNOSIS — Z8669 Personal history of other diseases of the nervous system and sense organs: Secondary | ICD-10-CM | POA: Diagnosis not present

## 2015-02-28 DIAGNOSIS — I10 Essential (primary) hypertension: Secondary | ICD-10-CM | POA: Insufficient documentation

## 2015-02-28 DIAGNOSIS — K8020 Calculus of gallbladder without cholecystitis without obstruction: Secondary | ICD-10-CM | POA: Diagnosis not present

## 2015-02-28 DIAGNOSIS — Z79899 Other long term (current) drug therapy: Secondary | ICD-10-CM | POA: Insufficient documentation

## 2015-02-28 DIAGNOSIS — Z8709 Personal history of other diseases of the respiratory system: Secondary | ICD-10-CM | POA: Diagnosis not present

## 2015-02-28 DIAGNOSIS — E039 Hypothyroidism, unspecified: Secondary | ICD-10-CM | POA: Insufficient documentation

## 2015-02-28 DIAGNOSIS — Z7982 Long term (current) use of aspirin: Secondary | ICD-10-CM | POA: Insufficient documentation

## 2015-02-28 DIAGNOSIS — R197 Diarrhea, unspecified: Secondary | ICD-10-CM | POA: Insufficient documentation

## 2015-02-28 DIAGNOSIS — R1013 Epigastric pain: Secondary | ICD-10-CM | POA: Diagnosis not present

## 2015-02-28 DIAGNOSIS — Z8739 Personal history of other diseases of the musculoskeletal system and connective tissue: Secondary | ICD-10-CM | POA: Diagnosis not present

## 2015-02-28 DIAGNOSIS — Z8619 Personal history of other infectious and parasitic diseases: Secondary | ICD-10-CM | POA: Diagnosis not present

## 2015-02-28 DIAGNOSIS — K5730 Diverticulosis of large intestine without perforation or abscess without bleeding: Secondary | ICD-10-CM | POA: Diagnosis not present

## 2015-02-28 LAB — COMPREHENSIVE METABOLIC PANEL
ALT: 14 U/L (ref 14–54)
AST: 25 U/L (ref 15–41)
Albumin: 3.6 g/dL (ref 3.5–5.0)
Alkaline Phosphatase: 52 U/L (ref 38–126)
Anion gap: 10 (ref 5–15)
BUN: 16 mg/dL (ref 6–20)
CO2: 25 mmol/L (ref 22–32)
Calcium: 9.2 mg/dL (ref 8.9–10.3)
Chloride: 104 mmol/L (ref 101–111)
Creatinine, Ser: 0.73 mg/dL (ref 0.44–1.00)
GFR calc Af Amer: >60 mL/min (ref >60)
GFR calc non Af Amer: >60 mL/min (ref >60)
Glucose, Bld: 150 mg/dL — ABNORMAL HIGH (ref 65–99)
Potassium: 3.7 mmol/L (ref 3.5–5.1)
Sodium: 139 mmol/L (ref 135–145)
Total Bilirubin: 0.4 mg/dL (ref 0.3–1.2)
Total Protein: 7.4 g/dL (ref 6.5–8.1)

## 2015-02-28 LAB — URINALYSIS, ROUTINE W REFLEX MICROSCOPIC
Bilirubin Urine: NEGATIVE
Glucose, UA: NEGATIVE mg/dL
Hgb urine dipstick: NEGATIVE
Ketones, ur: NEGATIVE mg/dL
Leukocytes, UA: NEGATIVE
Nitrite: NEGATIVE
Protein, ur: NEGATIVE mg/dL
Specific Gravity, Urine: 1.03 (ref 1.005–1.030)
Urobilinogen, UA: 0.2 mg/dL (ref 0.0–1.0)
pH: 7 (ref 5.0–8.0)

## 2015-02-28 LAB — CBC WITH DIFFERENTIAL/PLATELET
Basophils Absolute: 0 10*3/uL (ref 0.0–0.1)
Basophils Relative: 1 % (ref 0–1)
Eosinophils Absolute: 0 10*3/uL (ref 0.0–0.7)
Eosinophils Relative: 0 % (ref 0–5)
HCT: 35 % — ABNORMAL LOW (ref 36.0–46.0)
Hemoglobin: 12.3 g/dL (ref 12.0–15.0)
Lymphocytes Relative: 28 % (ref 12–46)
Lymphs Abs: 1.4 10*3/uL (ref 0.7–4.0)
MCH: 28.3 pg (ref 26.0–34.0)
MCHC: 35.1 g/dL (ref 30.0–36.0)
MCV: 80.5 fL (ref 78.0–100.0)
Monocytes Absolute: 0.3 10*3/uL (ref 0.1–1.0)
Monocytes Relative: 6 % (ref 3–12)
Neutro Abs: 3.5 10*3/uL (ref 1.7–7.7)
Neutrophils Relative %: 65 % (ref 43–77)
Platelets: 325 10*3/uL (ref 150–400)
RBC: 4.35 MIL/uL (ref 3.87–5.11)
RDW: 13.9 % (ref 11.5–15.5)
WBC: 5.2 10*3/uL (ref 4.0–10.5)

## 2015-02-28 LAB — I-STAT TROPONIN, ED: Troponin i, poc: 0 ng/mL (ref 0.00–0.08)

## 2015-02-28 LAB — I-STAT CG4 LACTIC ACID, ED
Lactic Acid, Venous: 3.03 mmol/L (ref 0.5–2.0)
Lactic Acid, Venous: 2.08 mmol/L (ref 0.5–2.0)

## 2015-02-28 LAB — LIPASE, BLOOD: Lipase: 30 U/L (ref 22–51)

## 2015-02-28 MED ORDER — SODIUM CHLORIDE 0.9 % IV BOLUS (SEPSIS)
1000.0000 mL | Freq: Once | INTRAVENOUS | Status: AC
  Administered 2015-02-28: 1000 mL via INTRAVENOUS

## 2015-02-28 MED ORDER — ONDANSETRON HCL 4 MG/2ML IJ SOLN
4.0000 mg | Freq: Once | INTRAMUSCULAR | Status: AC
  Administered 2015-02-28: 4 mg via INTRAVENOUS
  Filled 2015-02-28: qty 2

## 2015-02-28 MED ORDER — PANTOPRAZOLE SODIUM 40 MG IV SOLR
40.0000 mg | Freq: Once | INTRAVENOUS | Status: AC
  Administered 2015-02-28: 40 mg via INTRAVENOUS
  Filled 2015-02-28: qty 40

## 2015-02-28 MED ORDER — PANTOPRAZOLE SODIUM 40 MG PO TBEC: 40.0000 mg | DELAYED_RELEASE_TABLET | Freq: Every day | ORAL | Status: DC

## 2015-02-28 MED ORDER — OXYCODONE-ACETAMINOPHEN 5-325 MG PO TABS: 1.0000 | ORAL_TABLET | Freq: Four times a day (QID) | ORAL | Status: DC | PRN

## 2015-02-28 MED ORDER — IOHEXOL 300 MG/ML  SOLN
100.0000 mL | Freq: Once | INTRAMUSCULAR | Status: AC | PRN
  Administered 2015-02-28: 100 mL via INTRAVENOUS

## 2015-02-28 MED ORDER — FENTANYL CITRATE (PF) 100 MCG/2ML IJ SOLN
50.0000 ug | Freq: Once | INTRAMUSCULAR | Status: AC
  Administered 2015-02-28: 50 ug via INTRAVENOUS
  Filled 2015-02-28: qty 2

## 2015-02-28 MED ORDER — ONDANSETRON 4 MG PO TBDP: 4.0000 mg | ORAL_TABLET | Freq: Three times a day (TID) | ORAL | Status: DC | PRN

## 2015-02-28 MED ORDER — SODIUM CHLORIDE 0.9 % IV SOLN
INTRAVENOUS | Status: DC
  Administered 2015-02-28: 05:00:00 via INTRAVENOUS

## 2015-02-28 MED ORDER — HYDROMORPHONE HCL 1 MG/ML IJ SOLN
0.5000 mg | Freq: Once | INTRAMUSCULAR | Status: AC
  Administered 2015-02-28: 0.5 mg via INTRAVENOUS
  Filled 2015-02-28: qty 1

## 2015-02-28 MED ORDER — IOHEXOL 300 MG/ML  SOLN: 25.0000 mL | Freq: Once | INTRAMUSCULAR | Status: DC | PRN

## 2015-02-28 MED ORDER — LOPERAMIDE HCL 2 MG PO CAPS: 2.0000 mg | ORAL_CAPSULE | Freq: Four times a day (QID) | ORAL | Status: DC | PRN

## 2015-02-28 MED ORDER — MORPHINE SULFATE 4 MG/ML IJ SOLN
4.0000 mg | Freq: Once | INTRAMUSCULAR | Status: AC
  Administered 2015-02-28: 4 mg via INTRAVENOUS
  Filled 2015-02-28: qty 1

## 2015-02-28 MED ORDER — GI COCKTAIL ~~LOC~~
30.0000 mL | Freq: Once | ORAL | Status: AC
  Administered 2015-02-28: 30 mL via ORAL
  Filled 2015-02-28: qty 30

## 2015-02-28 NOTE — Discharge Instructions (Signed)
Biliary Colic  °Biliary colic is a steady or irregular pain in the upper abdomen. It is usually under the right side of the rib cage. It happens when gallstones interfere with the normal flow of bile from the gallbladder. Bile is a liquid that helps to digest fats. Bile is made in the liver and stored in the gallbladder. When you eat a meal, bile passes from the gallbladder through the cystic duct and the common bile duct into the small intestine. There, it mixes with partially digested food. If a gallstone blocks either of these ducts, the normal flow of bile is blocked. The muscle cells in the bile duct contract forcefully to try to move the stone. This causes the pain of biliary colic.  °SYMPTOMS  °· A person with biliary colic usually complains of pain in the upper abdomen. This pain can be: °· In the center of the upper abdomen just below the breastbone. °· In the upper-right part of the abdomen, near the gallbladder and liver. °· Spread back toward the right shoulder blade. °· Nausea and vomiting. °· The pain usually occurs after eating. °· Biliary colic is usually triggered by the digestive system's demand for bile. The demand for bile is high after fatty meals. Symptoms can also occur when a person who has been fasting suddenly eats a very large meal. Most episodes of biliary colic pass after 1 to 5 hours. After the most intense pain passes, your abdomen may continue to ache mildly for about 24 hours. °DIAGNOSIS  °After you describe your symptoms, your caregiver will perform a physical exam. He or she will pay attention to the upper right portion of your belly (abdomen). This is the area of your liver and gallbladder. An ultrasound will help your caregiver look for gallstones. Specialized scans of the gallbladder may also be done. Blood tests may be done, especially if you have fever or if your pain persists. °PREVENTION  °Biliary colic can be prevented by controlling the risk factors for gallstones. Some of  these risk factors, such as heredity, increasing age, and pregnancy are a normal part of life. Obesity and a high-fat diet are risk factors you can change through a healthy lifestyle. Women going through menopause who take hormone replacement therapy (estrogen) are also more likely to develop biliary colic. °TREATMENT  °· Pain medication may be prescribed. °· You may be encouraged to eat a fat-free diet. °· If the first episode of biliary colic is severe, or episodes of colic keep retuning, surgery to remove the gallbladder (cholecystectomy) is usually recommended. This procedure can be done through small incisions using an instrument called a laparoscope. The procedure often requires a brief stay in the hospital. Some people can leave the hospital the same day. It is the most widely used treatment in people troubled by painful gallstones. It is effective and safe, with no complications in more than 90% of cases. °· If surgery cannot be done, medication that dissolves gallstones may be used. This medication is expensive and can take months or years to work. Only small stones will dissolve. °· Rarely, medication to dissolve gallstones is combined with a procedure called shock-wave lithotripsy. This procedure uses carefully aimed shock waves to break up gallstones. In many people treated with this procedure, gallstones form again within a few years. °PROGNOSIS  °If gallstones block your cystic duct or common bile duct, you are at risk for repeated episodes of biliary colic. There is also a 25% chance that you will develop   a gallbladder infection(acute cholecystitis), or some other complication of gallstones within 10 to 20 years. If you have surgery, schedule it at a time that is convenient for you and at a time when you are not sick. HOME CARE INSTRUCTIONS   Drink plenty of clear fluids.  Avoid fatty, greasy or fried foods, or any foods that make your pain worse.  Take medications as directed. SEEK MEDICAL  CARE IF:   You develop a fever over 100.5 F (38.1 C).  Your pain gets worse over time.  You develop nausea that prevents you from eating and drinking.  You develop vomiting. SEEK IMMEDIATE MEDICAL CARE IF:   You have continuous or severe belly (abdominal) pain which is not relieved with medications.  You develop nausea and vomiting which is not relieved with medications.  You have symptoms of biliary colic and you suddenly develop a fever and shaking chills. This may signal cholecystitis. Call your caregiver immediately.  You develop a yellow color to your skin or the white part of your eyes (jaundice). Document Released: 02/25/2006 Document Revised: 12/17/2011 Document Reviewed: 05/06/2008 [facility] Patient Information 2015 [place], [place]. This information is not intended to replace advice given to you by your health care provider. Make sure you discuss any questions you have with your health care provider.   Possible Gastritis, Adult Gastritis is soreness and swelling (inflammation) of the lining of the stomach. Gastritis can develop as a sudden onset (acute) or long-term (chronic) condition. If gastritis is not treated, it can lead to stomach bleeding and ulcers. CAUSES  Gastritis occurs when the stomach lining is weak or damaged. Digestive juices from the stomach then inflame the weakened stomach lining. The stomach lining may be weak or damaged due to viral or bacterial infections. One common bacterial infection is the Helicobacter pylori infection. Gastritis can also result from excessive alcohol consumption, taking certain medicines, or having too much acid in the stomach.  SYMPTOMS  In some cases, there are no symptoms. When symptoms are present, they may include:  Pain or a burning sensation in the upper abdomen.  Nausea.  Vomiting.  An uncomfortable feeling of fullness after eating. DIAGNOSIS  Your caregiver may suspect you have gastritis based on your symptoms and a  physical exam. To determine the cause of your gastritis, your caregiver may perform the following:  Blood or stool tests to check for the H pylori bacterium.  Gastroscopy. A thin, flexible tube (endoscope) is passed down the esophagus and into the stomach. The endoscope has a light and camera on the end. Your caregiver uses the endoscope to view the inside of the stomach.  Taking a tissue sample (biopsy) from the stomach to examine under a microscope. TREATMENT  Depending on the cause of your gastritis, medicines may be prescribed. If you have a bacterial infection, such as an H pylori infection, antibiotics may be given. If your gastritis is caused by too much acid in the stomach, H2 blockers or antacids may be given. Your caregiver may recommend that you stop taking aspirin, ibuprofen, or other nonsteroidal anti-inflammatory drugs (NSAIDs). HOME CARE INSTRUCTIONS  Only take over-the-counter or prescription medicines as directed by your caregiver.  If you were given antibiotic medicines, take them as directed. Finish them even if you start to feel better.  Drink enough fluids to keep your urine clear or pale yellow.  Avoid foods and drinks that make your symptoms worse, such as:  Caffeine or alcoholic drinks.  Chocolate.  Peppermint or mint flavorings.  Garlic and onions.  Spicy foods.  Citrus fruits, such as oranges, lemons, or limes.  Tomato-based foods such as sauce, chili, salsa, and pizza.  Fried and fatty foods.  Eat small, frequent meals instead of large meals. SEEK IMMEDIATE MEDICAL CARE IF:   You have black or dark red stools.  You vomit blood or material that looks like coffee grounds.  You are unable to keep fluids down.  Your abdominal pain gets worse.  You have a fever.  You do not feel better after 1 week.  You have any other questions or concerns. MAKE SURE YOU:  Understand these instructions.  Will watch your condition.  Will get help right  away if you are not doing well or get worse. Document Released: 09/18/2001 Document Revised: 03/25/2012 Document Reviewed: 11/07/2011 [facility] Patient Information 2015 [place], [place]. This information is not intended to replace advice given to you by your health care provider. Make sure you discuss any questions you have with your health care provider.     Nausea and Vomiting Nausea is a sick feeling that often comes before throwing up (vomiting). Vomiting is a reflex where stomach contents come out of your mouth. Vomiting can cause severe loss of body fluids (dehydration). Children and elderly adults can become dehydrated quickly, especially if they also have diarrhea. Nausea and vomiting are symptoms of a condition or disease. It is important to find the cause of your symptoms. CAUSES   Direct irritation of the stomach lining. This irritation can result from increased acid production (gastroesophageal reflux disease), infection, food poisoning, taking certain medicines (such as nonsteroidal anti-inflammatory drugs), alcohol use, or tobacco use.  Signals from the brain.These signals could be caused by a headache, heat exposure, an inner ear disturbance, increased pressure in the brain from injury, infection, a tumor, or a concussion, pain, emotional stimulus, or metabolic problems.  An obstruction in the gastrointestinal tract (bowel obstruction).  Illnesses such as diabetes, hepatitis, gallbladder problems, appendicitis, kidney problems, cancer, sepsis, atypical symptoms of a heart attack, or eating disorders.  Medical treatments such as chemotherapy and radiation.  Receiving medicine that makes you sleep (general anesthetic) during surgery. DIAGNOSIS Your caregiver may ask for tests to be done if the problems do not improve after a few days. Tests may also be done if symptoms are severe or if the reason for the nausea and vomiting is not clear. Tests may include:  Urine tests.  Blood  tests.  Stool tests.  Cultures (to look for evidence of infection).  X-rays or other imaging studies. Test results can help your caregiver make decisions about treatment or the need for additional tests. TREATMENT You need to stay well hydrated. Drink frequently but in small amounts.You may wish to drink water, sports drinks, clear broth, or eat frozen ice pops or gelatin dessert to help stay hydrated.When you eat, eating slowly may help prevent nausea.There are also some antinausea medicines that may help prevent nausea. HOME CARE INSTRUCTIONS   Take all medicine as directed by your caregiver.  If you do not have an appetite, do not force yourself to eat. However, you must continue to drink fluids.  If you have an appetite, eat a normal diet unless your caregiver tells you differently.  Eat a variety of complex carbohydrates (rice, wheat, potatoes, bread), lean meats, yogurt, fruits, and vegetables.  Avoid high-fat foods because they are more difficult to digest.  Drink enough water and fluids to keep your urine clear or pale yellow.  If you  are dehydrated, ask your caregiver for specific rehydration instructions. Signs of dehydration may include:  Severe thirst.  Dry lips and mouth.  Dizziness.  Dark urine.  Decreasing urine frequency and amount.  Confusion.  Rapid breathing or pulse. SEEK IMMEDIATE MEDICAL CARE IF:   You have blood or brown flecks (like coffee grounds) in your vomit.  You have black or bloody stools.  You have a severe headache or stiff neck.  You are confused.  You have severe abdominal pain.  You have chest pain or trouble breathing.  You do not urinate at least once every 8 hours.  You develop cold or clammy skin.  You continue to vomit for longer than 24 to 48 hours.  You have a fever. MAKE SURE YOU:   Understand these instructions.  Will watch your condition.  Will get help right away if you are not doing well or get  worse. Document Released: 09/24/2005 Document Revised: 12/17/2011 Document Reviewed: 02/21/2011 [facility] Patient Information 2015 [place], [place]. This information is not intended to replace advice given to you by your health care provider. Make sure you discuss any questions you have with your health care provider.  Diarrhea Diarrhea is frequent loose and watery bowel movements. It can cause you to feel weak and dehydrated. Dehydration can cause you to become tired and thirsty, have a dry mouth, and have decreased urination that often is dark yellow. Diarrhea is a sign of another problem, most often an infection that will not last long. In most cases, diarrhea typically lasts 2-3 days. However, it can last longer if it is a sign of something more serious. It is important to treat your diarrhea as directed by your caregiver to lessen or prevent future episodes of diarrhea. CAUSES  Some common causes include:  Gastrointestinal infections caused by viruses, bacteria, or parasites.  Food poisoning or food allergies.  Certain medicines, such as antibiotics, chemotherapy, and laxatives.  Artificial sweeteners and fructose.  Digestive disorders. HOME CARE INSTRUCTIONS  Ensure adequate fluid intake (hydration): Have 1 cup (8 oz) of fluid for each diarrhea episode. Avoid fluids that contain simple sugars or sports drinks, fruit juices, whole milk products, and sodas. Your urine should be clear or pale yellow if you are drinking enough fluids. Hydrate with an oral rehydration solution that you can purchase at pharmacies, retail stores, and online. You can prepare an oral rehydration solution at home by mixing the following ingredients together:   - tsp table salt.   tsp baking soda.   tsp salt substitute containing potassium chloride.  1  tablespoons sugar.  1 L (34 oz) of water.  Certain foods and beverages may increase the speed at which food moves through the gastrointestinal (GI) tract.  These foods and beverages should be avoided and include:  Caffeinated and alcoholic beverages.  High-fiber foods, such as raw fruits and vegetables, nuts, seeds, and whole grain breads and cereals.  Foods and beverages sweetened with sugar alcohols, such as xylitol, sorbitol, and mannitol.  Some foods may be well tolerated and may help thicken stool including:  Starchy foods, such as rice, toast, pasta, low-sugar cereal, oatmeal, grits, baked potatoes, crackers, and bagels.  Bananas.  Applesauce.  Add probiotic-rich foods to help increase healthy bacteria in the GI tract, such as yogurt and fermented milk products.  Wash your hands well after each diarrhea episode.  Only take over-the-counter or prescription medicines as directed by your caregiver.  Take a warm bath to relieve any burning or pain  from frequent diarrhea episodes. SEEK IMMEDIATE MEDICAL CARE IF:   You are unable to keep fluids down.  You have persistent vomiting.  You have blood in your stool, or your stools are black and tarry.  You do not urinate in 6-8 hours, or there is only a small amount of very dark urine.  You have abdominal pain that increases or localizes.  You have weakness, dizziness, confusion, or light-headedness.  You have a severe headache.  Your diarrhea gets worse or does not get better.  You have a fever or persistent symptoms for more than 2-3 days.  You have a fever and your symptoms suddenly get worse. MAKE SURE YOU:   Understand these instructions.  Will watch your condition.  Will get help right away if you are not doing well or get worse. Document Released: 09/14/2002 Document Revised: 02/08/2014 Document Reviewed: 06/01/2012 [facility] Patient Information 2015 [place], [place]. This information is not intended to replace advice given to you by your health care provider. Make sure you discuss any questions you have with your health care provider.

## 2015-02-28 NOTE — ED Notes (Signed)
Pt here for abd pain, epigastric pain and vomiting onset last night, sts last time she vomited it has blood noted to it. Pt reports weak, not feeling well.

## 2015-02-28 NOTE — ED Notes (Signed)
Shown latic acid to dr.ward

## 2015-02-28 NOTE — ED Notes (Addendum)
   Pt family stated, "I don't understand why they aren't keeping you when they don't even know what is wrong." This NT asked pt if she wants to stay and she stated, "I'm not sure, but they can't do anything for me up here."

## 2015-02-28 NOTE — ED Notes (Signed)
Patient transported to CT 

## 2015-02-28 NOTE — ED Notes (Signed)
Pt given choice of staying to be admitted or going home, pt quiet, but states wants to go home-- "there isn't anything else you can do" explained that pt can stay for pain control-- denies wanting to stay. Son unhappy with decision, states "you haven't done anything for her" .

## 2015-02-28 NOTE — ED Provider Notes (Addendum)
TIME SEEN: 4:30 AM  CHIEF COMPLAINT: Abdominal pain, vomiting and diarrhea  HPI: Pt is a 81 y.o. female with history of hypertension, hypothyroidism who presents to the emergency department with epigastric abdominal pain without radiation that sharp in nature and severe that started at 9 PM last night. Started at rest. She has had vomiting and diarrhea. Noted some specks of bright red blood in her vomit today. No melena or bloody stool. Has never had similar pain. Reports that she ate corn flakes with milk earlier tonight. No history of abdominal surgery. No sick contacts. No chest pain or shortness of breath.  ROS: See HPI Constitutional: no fever  Eyes: no drainage  ENT: no runny nose   Cardiovascular:  no chest pain  Resp: no SOB  GI: no vomiting GU: no dysuria Integumentary: no rash  Allergy: no hives  Musculoskeletal: no leg swelling  Neurological: no slurred speech ROS otherwise negative  PAST MEDICAL HISTORY/PAST SURGICAL HISTORY:  Past Medical History  Diagnosis Date  . Bronchiectasis     isolated to RML; status post right middle lobe partial resection.  . Yeast infection   . Hypertension   . Hypothyroidism   . H/O osteoporosis   . OSA (obstructive sleep apnea)     MEDICATIONS:  Prior to Admission medications   Medication Sig Start Date End Date Taking? Authorizing Provider  acetaminophen (TYLENOL) 650 MG CR tablet Take 650 mg by mouth every 8 (eight) hours as needed for pain.    Historical Provider, MD  Ascorbic Acid (VITAMIN C PO) Take 1 tablet by mouth daily.      Historical Provider, MD  aspirin 81 MG tablet Take 81 mg by mouth daily.      Historical Provider, MD  Cholecalciferol (VITAMIN D) 2000 UNITS CAPS Take 1 capsule by mouth. Three times per wk     Historical Provider, MD  clonazePAM (KLONOPIN) 0.5 MG tablet Take 0.5 mg by mouth daily as needed.      Historical Provider, MD  Fish Oil-Krill Oil (PA FISH OIL/KRILL PO) Take by mouth. 300 krill oil and 90mg  fish  oil    Historical Provider, MD  furosemide (LASIX) 20 MG tablet Take 20 mg by mouth daily.      Historical Provider, MD  Glucosamine-Vitamin D (GLUCOSAMINE PLUS VITAMIN D) 1500-200 MG-UNIT TABS Take 1 tablet by mouth daily.      Historical Provider, MD  methimazole (TAPAZOLE) 5 MG tablet Take 5 mg by mouth daily.    Historical Provider, MD  metoprolol tartrate (LOPRESSOR) 25 MG tablet Take 25 mg by mouth daily.      Historical Provider, MD  Multiple Vitamin (MULTIVITAMIN) tablet Take 1 tablet by mouth daily.    Historical Provider, MD  potassium chloride SA (K-DUR,KLOR-CON) 20 MEQ tablet Take 20 mEq by mouth 2 (two) times daily.    Historical Provider, MD    ALLERGIES:  Allergies  Allergen Reactions  . Ibuprofen   . Naproxen Sodium     SOCIAL HISTORY:  History  Substance Use Topics  . Smoking status: Never Smoker   . Smokeless tobacco: Never Used  . Alcohol Use: No    FAMILY HISTORY: Family History  Problem Relation Age of Onset  . Hypertension Mother   . CVA Mother 1  . Heart attack Sister   . Diabetes Sister   . Heart attack Brother   . Heart attack Brother     EXAM: BP 135/65 mmHg  Pulse 75  Temp(Src) 97.8 F (36.6  C) (Oral)  Resp 26  Ht 5\' 1"  (1.549 m)  Wt 198 lb (89.812 kg)  BMI 37.43 kg/m2  SpO2 95% CONSTITUTIONAL: Alert and oriented and responds appropriately to questions. Appears uncomfortable but is nontoxic HEAD: Normocephalic EYES: Conjunctivae clear, PERRL ENT: normal nose; no rhinorrhea; moist mucous membranes; pharynx without lesions noted NECK: Supple, no meningismus, no LAD  CARD: RRR; S1 and S2 appreciated; no murmurs, no clicks, no rubs, no gallops RESP: Normal chest excursion without splinting or tachypnea; breath sounds clear and equal bilaterally; no wheezes, no rhonchi, no rales, no hypoxia or respiratory distress, speaking full sentences ABD/GI: Normal bowel sounds; non-distended; soft, tender to palpation in the center of the abdomen and  epigastric region with voluntary guarding, no rebound, no peritoneal signs, negative Murphy sign, no tenderness at McBurney's point BACK:  The back appears normal and is non-tender to palpation, there is no CVA tenderness EXT: Normal ROM in all joints; non-tender to palpation; no edema; normal capillary refill; no cyanosis, no calf tenderness or swelling    SKIN: Normal color for age and race; warm NEURO: Moves all extremities equally, sensation to light touch intact diffusely, cranial nerves II through XII intact PSYCH: The patient's mood and manner are appropriate. Grooming and personal hygiene are appropriate.  MEDICAL DECISION MAKING: Patient here with central abdominal pain, epigastric abdominal pain. Differential diagnosis includes pancreatitis, gastric ulcer, gastritis, ACS, cholelithiasis or cholecystitis. Will obtain abdominal labs, urine. Will also obtain a CT of her abdomen and pelvis. Abdomen is nonsurgical and she is nontoxic appearing. We'll give IV fluids, pain and nausea medicine.  ED PROGRESS: Patient's lactate is elevated at 3.03. She is receiving IV fluids. Labs otherwise are unremarkable with no leukocytosis. Normal LFTs and lipase. Troponin negative. CT scan pending.   7:30 AM  Pt reports feeling better. Her lactate has improved with IV hydration. She is drinking without difficulty and no further vomiting. Her CT scan shows cholelithiasis without signs of cholecystitis. There is diverticulosis without diverticulitis.  Patient reports she is still having epigastric pain but that it is improved. I have offered her admission several times for pain control as her family does not feel comfortable with her being discharged home and feels like they would want her monitored for another 24 hours. She refuses admission and states she would like to go home. We'll discharge home with Percocet, Zofran. We'll get outpatient surgery follow-up information given her cholelithiasis. Will also  discharge on Protonix. Discussed with her strict return precautions. She verbalized understanding and is comfortable with plan.    EKG Interpretation  Date/Time:  Monday Feb 28 2015 04:24:15 EDT Ventricular Rate:  76 PR Interval:  202 QRS Duration: 83 QT Interval:  408 QTC Calculation: 459 R Axis:   65 Text Interpretation:  Atrial-paced complexes Normal sinus rhythm Early repolarization No significant change since last tracing in 2008 Confirmed by Mehak Roskelley,  DO, Corlis Angelica [phone]) on 02/28/2015 4:28:12 AM         [place], DO 02/28/15 0729   8:05 AM  Pt's son upset that she is being dc'ed home.  Have explained to him several times that pt may have gastritis vs cholelithiasis (CT otherwise unremarkable) causing her pain or this may be a viral gastroenteritis.  Explained results of labs, urine, CT imaging.  Discussed plan of care.  She has not vomited in over one hour and is drinking and she reports she wants to go home. We have not witnessed any blood in her vomit.  I have offered her admission for pain control. IV fluids several times which she has refused repeatedly.  I do not feel she has to be admitted at this time.  Have again re-iterated return precautions.    [place], DO 02/28/15 [place], DO 03/01/15 0345

## 2015-02-28 NOTE — ED Notes (Signed)
MD at bedside. 

## 2015-04-12 DIAGNOSIS — L718 Other rosacea: Secondary | ICD-10-CM | POA: Diagnosis not present

## 2015-04-12 DIAGNOSIS — H2513 Age-related nuclear cataract, bilateral: Secondary | ICD-10-CM | POA: Diagnosis not present

## 2015-04-12 DIAGNOSIS — I7090 Unspecified atherosclerosis: Secondary | ICD-10-CM | POA: Diagnosis not present

## 2015-04-12 DIAGNOSIS — H40013 Open angle with borderline findings, low risk, bilateral: Secondary | ICD-10-CM | POA: Diagnosis not present

## 2015-04-15 DIAGNOSIS — M25569 Pain in unspecified knee: Secondary | ICD-10-CM | POA: Diagnosis not present

## 2015-04-15 DIAGNOSIS — K8018 Calculus of gallbladder with other cholecystitis without obstruction: Secondary | ICD-10-CM | POA: Diagnosis not present

## 2015-04-26 DIAGNOSIS — E0590 Thyrotoxicosis, unspecified without thyrotoxic crisis or storm: Secondary | ICD-10-CM | POA: Diagnosis not present

## 2015-05-03 DIAGNOSIS — E0590 Thyrotoxicosis, unspecified without thyrotoxic crisis or storm: Secondary | ICD-10-CM | POA: Diagnosis not present

## 2015-05-03 DIAGNOSIS — E780 Pure hypercholesterolemia: Secondary | ICD-10-CM | POA: Diagnosis not present

## 2015-05-03 DIAGNOSIS — E049 Nontoxic goiter, unspecified: Secondary | ICD-10-CM | POA: Diagnosis not present

## 2015-05-17 DIAGNOSIS — I129 Hypertensive chronic kidney disease with stage 1 through stage 4 chronic kidney disease, or unspecified chronic kidney disease: Secondary | ICD-10-CM | POA: Diagnosis not present

## 2015-05-17 DIAGNOSIS — E559 Vitamin D deficiency, unspecified: Secondary | ICD-10-CM | POA: Diagnosis not present

## 2015-05-17 DIAGNOSIS — N182 Chronic kidney disease, stage 2 (mild): Secondary | ICD-10-CM | POA: Diagnosis not present

## 2015-05-17 DIAGNOSIS — Z0000 Encounter for general adult medical examination without abnormal findings: Secondary | ICD-10-CM | POA: Diagnosis not present

## 2015-05-17 DIAGNOSIS — E780 Pure hypercholesterolemia: Secondary | ICD-10-CM | POA: Diagnosis not present

## 2015-05-17 DIAGNOSIS — R7309 Other abnormal glucose: Secondary | ICD-10-CM | POA: Diagnosis not present

## 2015-05-17 DIAGNOSIS — Z1389 Encounter for screening for other disorder: Secondary | ICD-10-CM | POA: Diagnosis not present

## 2015-05-17 DIAGNOSIS — R252 Cramp and spasm: Secondary | ICD-10-CM | POA: Diagnosis not present

## 2015-06-07 DIAGNOSIS — M545 Low back pain: Secondary | ICD-10-CM | POA: Diagnosis not present

## 2015-06-09 DIAGNOSIS — M545 Low back pain: Secondary | ICD-10-CM | POA: Diagnosis not present

## 2015-06-14 DIAGNOSIS — M545 Low back pain: Secondary | ICD-10-CM | POA: Diagnosis not present

## 2015-06-16 DIAGNOSIS — M545 Low back pain: Secondary | ICD-10-CM | POA: Diagnosis not present

## 2015-06-21 DIAGNOSIS — M545 Low back pain: Secondary | ICD-10-CM | POA: Diagnosis not present

## 2015-06-23 DIAGNOSIS — M545 Low back pain: Secondary | ICD-10-CM | POA: Diagnosis not present

## 2015-06-28 DIAGNOSIS — M545 Low back pain: Secondary | ICD-10-CM | POA: Diagnosis not present

## 2015-06-30 DIAGNOSIS — M545 Low back pain: Secondary | ICD-10-CM | POA: Diagnosis not present

## 2015-07-05 DIAGNOSIS — M545 Low back pain: Secondary | ICD-10-CM | POA: Diagnosis not present

## 2015-07-06 DIAGNOSIS — Z1231 Encounter for screening mammogram for malignant neoplasm of breast: Secondary | ICD-10-CM | POA: Diagnosis not present

## 2015-07-07 DIAGNOSIS — M545 Low back pain: Secondary | ICD-10-CM | POA: Diagnosis not present

## 2015-07-12 DIAGNOSIS — M545 Low back pain: Secondary | ICD-10-CM | POA: Diagnosis not present

## 2015-07-14 DIAGNOSIS — M545 Low back pain: Secondary | ICD-10-CM | POA: Diagnosis not present

## 2015-07-19 DIAGNOSIS — M545 Low back pain: Secondary | ICD-10-CM | POA: Diagnosis not present

## 2015-09-21 DIAGNOSIS — I129 Hypertensive chronic kidney disease with stage 1 through stage 4 chronic kidney disease, or unspecified chronic kidney disease: Secondary | ICD-10-CM | POA: Diagnosis not present

## 2015-09-21 DIAGNOSIS — M542 Cervicalgia: Secondary | ICD-10-CM | POA: Diagnosis not present

## 2015-09-21 DIAGNOSIS — N182 Chronic kidney disease, stage 2 (mild): Secondary | ICD-10-CM | POA: Diagnosis not present

## 2015-09-21 DIAGNOSIS — G4762 Sleep related leg cramps: Secondary | ICD-10-CM | POA: Diagnosis not present

## 2015-09-21 DIAGNOSIS — R7309 Other abnormal glucose: Secondary | ICD-10-CM | POA: Diagnosis not present

## 2015-09-27 DIAGNOSIS — R0989 Other specified symptoms and signs involving the circulatory and respiratory systems: Secondary | ICD-10-CM | POA: Diagnosis not present

## 2015-09-27 DIAGNOSIS — N182 Chronic kidney disease, stage 2 (mild): Secondary | ICD-10-CM | POA: Diagnosis not present

## 2015-09-27 DIAGNOSIS — J069 Acute upper respiratory infection, unspecified: Secondary | ICD-10-CM | POA: Diagnosis not present

## 2015-09-27 DIAGNOSIS — I129 Hypertensive chronic kidney disease with stage 1 through stage 4 chronic kidney disease, or unspecified chronic kidney disease: Secondary | ICD-10-CM | POA: Diagnosis not present

## 2015-09-27 DIAGNOSIS — H9202 Otalgia, left ear: Secondary | ICD-10-CM | POA: Diagnosis not present

## 2015-10-20 DIAGNOSIS — H40013 Open angle with borderline findings, low risk, bilateral: Secondary | ICD-10-CM | POA: Diagnosis not present

## 2015-11-03 DIAGNOSIS — E0590 Thyrotoxicosis, unspecified without thyrotoxic crisis or storm: Secondary | ICD-10-CM | POA: Diagnosis not present

## 2015-11-04 DIAGNOSIS — M25562 Pain in left knee: Secondary | ICD-10-CM | POA: Diagnosis not present

## 2015-11-04 DIAGNOSIS — M25561 Pain in right knee: Secondary | ICD-10-CM | POA: Diagnosis not present

## 2016-01-03 ENCOUNTER — Ambulatory Visit (INDEPENDENT_AMBULATORY_CARE_PROVIDER_SITE_OTHER): Payer: Medicare Other | Admitting: Pulmonary Disease

## 2016-01-03 ENCOUNTER — Encounter: Payer: Self-pay | Admitting: Pulmonary Disease

## 2016-01-03 VITALS — BP 124/76 | HR 63 | Ht 61.0 in | Wt 202.2 lb

## 2016-01-03 DIAGNOSIS — R0602 Shortness of breath: Secondary | ICD-10-CM | POA: Diagnosis not present

## 2016-01-03 NOTE — Progress Notes (Signed)
Subjective:    Patient ID: [name], female    DOB: Mar 21, 1933, 82 y.o.   MRN: [id number]  HPI Follow-up for dyspnea.  Mrs. Blaski is a 82 year old with history of right middle lobe resection for pneumonia, bronchiectasis in 2008. She had seen Dr. Gwenette Greet from 2012- 15 for evaluation of mild dyspnea. An extensive workup did not reveal any etiology. She had PFTs that showed only very mild obstruction, reduction in diffusion capacity and normal echocardiogram and CT of the chest. She was tried on various inhalers without any change in symptoms. She was discharged from the pulmonary clinic to follow up as needed.  She returns today to reestablish care with [place]. She does not have any new complaints. She actually states that her breathing is pretty good. She denies any dyspnea, wheezing, cough, sputum production.  DATA: Surgical pathology 11/28/06 1. LUNG, RIGHT LOWER LOBE AND PLEURA, BIOPSY: PORTIONS OF PLEURA WITH FIBROSIS AND CHRONIC INFLAMMATION AND UNDERLYING LUNG PARENCHYMA WITH FOCAL FIBROSIS AND HEMORRHAGE.  2. LUNG, RIGHT MIDDLE LOBE, RESECTION: ACUTE AND CHRONIC INFLAMMATION AND ORGANIZING PNEUMONIA.  CT chest 12/30/08 Postoperative changes in the right midlung. Negative for PE or acute cardiopulmonary disease Hiatal hernia.  PFTs 08/21/10 FVC 2.21 [89%) FEV1 1.54 (90%) F/F 69 TLC 97% DLCO 60%. Very mild airflow obstruction, no response to bronchodilator, restriction, mild reduction in diffusion capacity.  Echocardiogram 10/09/11 Normal LV size, mild concentric LVH, LVEF greater than 55%, impaired LV relaxation. Normal RV size and systolic function Mild TR  Social History: Never smoker, no alcohol, drug use.  Family History: Mother-CVA, hypertension Brother-heart attack Sister-diabetes, heart attack  Past Medical History  Diagnosis Date  . Bronchiectasis     isolated to RML; status post right middle lobe partial resection.  . Yeast infection   .  Hypertension   . Hypothyroidism   . H/O osteoporosis   . OSA (obstructive sleep apnea)     Current outpatient prescriptions:  .  acetaminophen (TYLENOL) 650 MG CR tablet, Take 650 mg by mouth every 8 (eight) hours as needed for pain., Disp: , Rfl:  .  Ascorbic Acid (VITAMIN C PO), Take 500 mg by mouth daily. , Disp: , Rfl:  .  aspirin 81 MG tablet, Take 81 mg by mouth daily.  , Disp: , Rfl:  .  Azelaic Acid 15 % cream, Apply 1 application topically 2 (two) times daily. After skin is thoroughly washed and patted dry, gently but thoroughly massage a thin film of azelaic acid cream into the affected area twice daily, in the morning and evening., Disp: , Rfl:  .  CALCIUM-MAGNESUIUM-ZINC 333-133-8.3 MG TABS, Take 1 tablet by mouth daily., Disp: , Rfl:  .  cholecalciferol (VITAMIN D) 1000 UNITS tablet, Take 1,000 Units by mouth daily., Disp: , Rfl:  .  diclofenac sodium (VOLTAREN) 1 % GEL, Apply daily as needed, Disp: , Rfl:  .  fenofibrate (TRICOR) 145 MG tablet, Take 145 mg by mouth daily., Disp: , Rfl:  .  Flaxseed, Linseed, (FLAX SEED OIL PO), Take 1 tablet by mouth daily., Disp: , Rfl:  .  furosemide (LASIX) 20 MG tablet, Take 20 mg by mouth daily.  , Disp: , Rfl:  .  loperamide (IMODIUM) 2 MG capsule, Take 1 capsule (2 mg total) by mouth 4 (four) times daily as needed for diarrhea or loose stools., Disp: 15 capsule, Rfl: 0 .  methimazole (TAPAZOLE) 5 MG tablet, Take 5 mg by mouth daily., Disp: , Rfl:  .  metoprolol  tartrate (LOPRESSOR) 25 MG tablet, Take 25 mg by mouth daily.  , Disp: , Rfl:  .  ondansetron (ZOFRAN ODT) 4 MG disintegrating tablet, Take 1 tablet (4 mg total) by mouth every 8 (eight) hours as needed for nausea or vomiting., Disp: 20 tablet, Rfl: 0 .  oxyCODONE-acetaminophen (PERCOCET/ROXICET) 5-325 MG per tablet, Take 1 tablet by mouth every 6 (six) hours as needed., Disp: 15 tablet, Rfl: 0 .  pantoprazole (PROTONIX) 40 MG tablet, Take 1 tablet (40 mg total) by mouth daily.,  Disp: 30 tablet, Rfl: 1 .  potassium chloride SA (K-DUR,KLOR-CON) 20 MEQ tablet, Take 20 mEq by mouth daily. , Disp: , Rfl:    Review of Systems Cough, sputum production, dyspnea, wheezing, hemoptysis. No chest pain, palpitation. No nausea, vomiting, diarrhea, constipation. No fevers, chills, loss of weight, loss of appetite. All other review of systems are negative.    Objective:   Physical Exam Blood pressure 124/76, pulse 63, height 5\' 1"  (1.549 m), weight 202 lb 3.2 oz (91.717 kg), SpO2 96 %.;s Gen: No apparent distress Neuro: No gross focal deficits. Neck: No JVD, lymphadenopathy, thyromegaly. RS: Clear, No wheeze or crackles CVS: S1-S2 heard, no murmurs rubs gallops. Abdomen: Soft, positive bowel sounds. Extremities: No edema.    Assessment & Plan:  Mild Dyspnea. Right middle lobe resection for bronchiectasis, pneumonia.  She does not have any new respiratory symptoms since her last visit to the pulmonary clinic in 2015 and is not on any inhalers. The workup in the past has not shown any significant lung or cardiac impairments. She is here today just to reestablish care with [place] and to have a doctor to call if she should have any worsening of her breathing. We'll continue to observe her.  Return to clinic in 1 year.  Marshell Garfinkel MD [facility] Pager [phone] If no answer or after 3pm call: [phone] 01/03/2016, 4:18 PM

## 2016-01-03 NOTE — Patient Instructions (Signed)
Follow-up in one year.  We are not making any changes to medication regimen at this point. Please call us back if there are any change in your respiratory symptoms.

## 2016-01-25 DIAGNOSIS — I129 Hypertensive chronic kidney disease with stage 1 through stage 4 chronic kidney disease, or unspecified chronic kidney disease: Secondary | ICD-10-CM | POA: Diagnosis not present

## 2016-01-25 DIAGNOSIS — E785 Hyperlipidemia, unspecified: Secondary | ICD-10-CM | POA: Diagnosis not present

## 2016-01-25 DIAGNOSIS — Z6838 Body mass index (BMI) 38.0-38.9, adult: Secondary | ICD-10-CM | POA: Diagnosis not present

## 2016-01-25 DIAGNOSIS — N182 Chronic kidney disease, stage 2 (mild): Secondary | ICD-10-CM | POA: Diagnosis not present

## 2016-01-31 DIAGNOSIS — M25562 Pain in left knee: Secondary | ICD-10-CM | POA: Diagnosis not present

## 2016-01-31 DIAGNOSIS — M25561 Pain in right knee: Secondary | ICD-10-CM | POA: Diagnosis not present

## 2016-02-09 DIAGNOSIS — Z6839 Body mass index (BMI) 39.0-39.9, adult: Secondary | ICD-10-CM | POA: Diagnosis not present

## 2016-02-09 DIAGNOSIS — Z01419 Encounter for gynecological examination (general) (routine) without abnormal findings: Secondary | ICD-10-CM | POA: Diagnosis not present

## 2016-02-09 DIAGNOSIS — M810 Age-related osteoporosis without current pathological fracture: Secondary | ICD-10-CM | POA: Diagnosis not present

## 2016-02-17 ENCOUNTER — Ambulatory Visit: Payer: Medicare Other | Admitting: Cardiology

## 2016-03-08 ENCOUNTER — Encounter: Payer: Self-pay | Admitting: Cardiology

## 2016-03-08 ENCOUNTER — Ambulatory Visit (INDEPENDENT_AMBULATORY_CARE_PROVIDER_SITE_OTHER): Payer: Medicare Other | Admitting: Cardiology

## 2016-03-08 VITALS — BP 110/68 | HR 62 | Ht 61.0 in | Wt 210.0 lb

## 2016-03-08 DIAGNOSIS — R600 Localized edema: Secondary | ICD-10-CM | POA: Diagnosis not present

## 2016-03-08 DIAGNOSIS — R0602 Shortness of breath: Secondary | ICD-10-CM | POA: Diagnosis not present

## 2016-03-08 DIAGNOSIS — E7800 Pure hypercholesterolemia, unspecified: Secondary | ICD-10-CM | POA: Diagnosis not present

## 2016-03-08 DIAGNOSIS — I10 Essential (primary) hypertension: Secondary | ICD-10-CM

## 2016-03-08 NOTE — Patient Instructions (Signed)
NO CHANGE WITH CURRENT MEDICATIONS   Your physician wants you to follow-up in 12 MONTHS WITH DR HARDING. You will receive a reminder letter in the mail two months in advance. If you don't receive a letter, please call our office to schedule the follow-up appointment.     If you need a refill on your cardiac medications before your next appointment, please call your pharmacy.  

## 2016-03-08 NOTE — Progress Notes (Signed)
PCP: Maximino Greenland, MD  Clinic Note: Chief Complaint  Patient presents with  . Follow-up    Exertional dyspnea    HPI: [name] is a 82 y.o. female with a PMH below who presents today for annual follow-up for exertional dyspnea with a nonischemic evaluation. She is Moderately obese woman with a history of partial right middle lobectomy for pneumonia in 2008. March 2015 she had some atypical chest discomfort that were probably more musculoskeletal in nature.  [name] was last seen on May 2016, and was doing great. Nothing more than her baseline dyspnea and fatigue. She gets short of breath going up and down steps. Mild edema.  Recent Hospitalizations: None with the exception of a ER visit in May 2016 for nausea and vomiting.  Studies Reviewed: None  Interval History:  Pleas Koch notes Stable exertional dyspnea going up & down stairs, But no other significant symptoms. She is starting to walk with a cane for more stability. She denies any PND, orthopnea with mild stable edema.  No chest pain or shortness of breath with rest or exertion.   Cardiac review of symptoms: No palpitations, lightheadedness, dizziness, weakness or syncope/near syncope. No TIA/amaurosis fugax symptoms. No melena, hematochezia, hematuria, or epstaxis. No claudication.  ROS: A comprehensive was performed. Review of Systems  Constitutional: Negative for malaise/fatigue.  HENT: Negative for nosebleeds.   Eyes: Negative for blurred vision and double vision.  Respiratory: Positive for shortness of breath (exertional ).   Cardiovascular: Positive for leg swelling (controlled). Negative for chest pain and palpitations.  Gastrointestinal: Negative for blood in stool and melena.  Genitourinary: Negative for hematuria.  Musculoskeletal: Positive for joint pain (OA pain). Negative for myalgias and falls.  Neurological: Negative for dizziness, tingling, loss of consciousness and headaches.    Endo/Heme/Allergies: Does not bruise/bleed easily (has a bruise on the L leg - bumped her leg).  Psychiatric/Behavioral: Negative for depression, hallucinations and memory loss. The patient is not nervous/anxious and does not have insomnia.   All other systems reviewed and are negative.   Past Medical History  Diagnosis Date  . Bronchiectasis     isolated to RML; status post right middle lobe partial resection.  . Yeast infection   . Hypertension   . Hypothyroidism   . H/O osteoporosis   . OSA (obstructive sleep apnea)     Past Surgical History  Procedure Laterality Date  . Lung removal, partial  208    RML  . Abdominal hysterectomy  1974  . Hernia repair  1975  . Transthoracic echocardiogram  September 2012    Mild concentric LVH. Normal EF with impaired relaxation. Mildly elevated RV pressures of 30 and 40 mmHg.  [facility]  September 2012    Normal EF. No ischemia or infarction.    Prior to Admission medications   Medication Sig Start Date End Date Taking? Authorizing Provider  acetaminophen (TYLENOL) 650 MG CR tablet Take 650 mg by mouth every 8 (eight) hours as needed for pain.    Historical Provider, MD  Ascorbic Acid (VITAMIN C PO) Take 500 mg by mouth daily.     Historical Provider, MD  aspirin 81 MG tablet Take 81 mg by mouth daily.      Historical Provider, MD  Azelaic Acid 15 % cream Apply 1 application topically 2 (two) times daily. After skin is thoroughly washed and patted dry, gently but thoroughly massage a thin film of azelaic acid cream into the affected area twice  daily, in the morning and evening.    Historical Provider, MD  CALCIUM-MAGNESUIUM-ZINC 333-133-8.3 MG TABS Take 1 tablet by mouth daily.    Historical Provider, MD  cholecalciferol (VITAMIN D) 1000 UNITS tablet Take 1,000 Units by mouth daily.    Historical Provider, MD  diclofenac sodium (VOLTAREN) 1 % GEL Apply daily as needed 11/08/15   Historical Provider, MD  fenofibrate (TRICOR) 145 MG  tablet Take 145 mg by mouth daily.    Historical Provider, MD  Flaxseed, Linseed, (FLAX SEED OIL PO) Take 1 tablet by mouth daily.    Historical Provider, MD  furosemide (LASIX) 20 MG tablet Take 20 mg by mouth daily.      Historical Provider, MD  loperamide (IMODIUM) 2 MG capsule Take 1 capsule (2 mg total) by mouth 4 (four) times daily as needed for diarrhea or loose stools. 02/28/15   Kristen N Ward, DO  methimazole (TAPAZOLE) 5 MG tablet Take 5 mg by mouth daily.    Historical Provider, MD  metoprolol tartrate (LOPRESSOR) 25 MG tablet Take 25 mg by mouth daily.      Historical Provider, MD  ondansetron (ZOFRAN ODT) 4 MG disintegrating tablet Take 1 tablet (4 mg total) by mouth every 8 (eight) hours as needed for nausea or vomiting. 02/28/15   Kristen N Ward, DO  oxyCODONE-acetaminophen (PERCOCET/ROXICET) 5-325 MG per tablet Take 1 tablet by mouth every 6 (six) hours as needed. 02/28/15   Kristen N Ward, DO  pantoprazole (PROTONIX) 40 MG tablet Take 1 tablet (40 mg total) by mouth daily. 02/28/15   Kristen N Ward, DO  potassium chloride SA (K-DUR,KLOR-CON) 20 MEQ tablet Take 20 mEq by mouth daily.     Historical Provider, MD   Allergies  Allergen Reactions  . Ibuprofen Other (See Comments)    hallucinations  . Naproxen Sodium Other (See Comments)    hallucinations     Social History   Social History  . Marital Status: Widowed    Spouse Name: N/A  . Number of Children: N/A  . Years of Education: N/A   Social History Main Topics  . Smoking status: Never Smoker   . Smokeless tobacco: Never Used  . Alcohol Use: No  . Drug Use: No  . Sexual Activity: No   Other Topics Concern  . None   Social History Narrative   She is a widowed mother of 4, grandmother for, a great-grandmother 4.   She has been trying to do more exercise than before, but has been injured recently by her back pain.   She never smoked, doesn't drink alcohol.   Family History family history includes CVA (age of  onset: 30) in her mother; Diabetes in her sister; Heart attack in her brother, brother, and sister; Hypertension in her mother.   Wt Readings from Last 3 Encounters:  03/08/16 210 lb (95.255 kg)  01/03/16 202 lb 3.2 oz (91.717 kg)  02/28/15 198 lb (89.812 kg)    PHYSICAL EXAM BP 110/68 mmHg  Pulse 62  Ht 5\' 1"  (1.549 m)  Wt 210 lb (95.255 kg)  BMI 39.70 kg/m2  SpO2 98% General: She is a very pleasant woman. She actually looks younger than her stated age. A&) x3, answers questions appropriately. She is well nourished, moderately to severely obese  NEUROLOGIC: CN II-XII grossly intact with normal strength and normal gait.  HEENT: NCAT, EOMI, MMM. Neck: Supple; no LAN, JVD, or carotid bruit. No notable JVD Lungs: CTAB, nonlabored, normal effort, good air movement. Normal  to percussion bilaterally. No wheezes, rales, or rhonchi.  Heart: RRR, normal S1, S2. No M/R/G. Difficult to palpate PMI due to body habitus. Mild tenderness along the costochondral margin in the left upper chest. Abdomen: Soft/NT/ND/NABS. No HSM. Truncal obesity.  Extremities: Minimal edema with 2+ pulses throughout. Gait is normal     Adult ECG Report  Rate: 62 ;  Rhythm: normal sinus rhythm and Normal axis, intervals and durations;   Narrative Interpretation: Normal EKG   Other studies Reviewed: Additional studies/ records that were reviewed today include:  Recent Labs:  Checked by PCP     ASSESSMENT / PLAN: Problem List Items Addressed This Visit    Lower leg edema    Mild. On stable dose of Lasix= well-controlled      Hypercholesteremia    Followed by PCP. Only on fish oil.      Essential hypertension (Chronic)    Well-controlled on current medications      Relevant Orders   EKG 12-Lead   DYSPNEA - Primary (Chronic)    Overall stable symptoms. Has had a negative ischemic evaluation and relatively normal echocardiogram. I don't much this is related to cardiac symptoms. Likely more related  to her pulmonary status      Relevant Orders   EKG 12-Lead      Current medicines are reviewed at length with the patient today. (+/- concerns) none  The following changes have been made: none Studies Ordered:   Orders Placed This Encounter  Procedures  . EKG 12-Lead   F/U 12 MONTHS WITH DR Chrisanne Loose.  Glenetta Hew, M.D., M.S. Interventional Cardiologist   Pager # [phone] Phone # [phone] [address] [place], [place] [address]

## 2016-03-10 ENCOUNTER — Encounter: Payer: Self-pay | Admitting: Cardiology

## 2016-03-10 NOTE — Assessment & Plan Note (Signed)
Overall stable symptoms. Has had a negative ischemic evaluation and relatively normal echocardiogram. I don't much this is related to cardiac symptoms. Likely more related to her pulmonary status

## 2016-03-10 NOTE — Assessment & Plan Note (Signed)
Well-controlled on current medications 

## 2016-03-10 NOTE — Assessment & Plan Note (Signed)
Followed by PCP. Only on fish oil.

## 2016-03-10 NOTE — Assessment & Plan Note (Signed)
Mild. On stable dose of Lasix= well-controlled

## 2016-04-16 DIAGNOSIS — D179 Benign lipomatous neoplasm, unspecified: Secondary | ICD-10-CM | POA: Diagnosis not present

## 2016-04-16 DIAGNOSIS — R7309 Other abnormal glucose: Secondary | ICD-10-CM | POA: Diagnosis not present

## 2016-04-16 DIAGNOSIS — I739 Peripheral vascular disease, unspecified: Secondary | ICD-10-CM | POA: Diagnosis not present

## 2016-04-16 DIAGNOSIS — G4762 Sleep related leg cramps: Secondary | ICD-10-CM | POA: Diagnosis not present

## 2016-04-25 DIAGNOSIS — H25013 Cortical age-related cataract, bilateral: Secondary | ICD-10-CM | POA: Diagnosis not present

## 2016-04-25 DIAGNOSIS — H40013 Open angle with borderline findings, low risk, bilateral: Secondary | ICD-10-CM | POA: Diagnosis not present

## 2016-04-25 DIAGNOSIS — H35371 Puckering of macula, right eye: Secondary | ICD-10-CM | POA: Diagnosis not present

## 2016-04-25 DIAGNOSIS — H2513 Age-related nuclear cataract, bilateral: Secondary | ICD-10-CM | POA: Diagnosis not present

## 2016-04-30 ENCOUNTER — Other Ambulatory Visit (HOSPITAL_COMMUNITY): Payer: Self-pay | Admitting: Internal Medicine

## 2016-04-30 DIAGNOSIS — I739 Peripheral vascular disease, unspecified: Secondary | ICD-10-CM

## 2016-05-01 ENCOUNTER — Ambulatory Visit (HOSPITAL_COMMUNITY)
Admission: RE | Admit: 2016-05-01 | Discharge: 2016-05-01 | Disposition: A | Discharge status: Home / Self Care | Payer: Medicare Other | Source: Ambulatory Visit | Attending: Cardiovascular Disease | Admitting: Cardiovascular Disease

## 2016-05-01 DIAGNOSIS — E0590 Thyrotoxicosis, unspecified without thyrotoxic crisis or storm: Secondary | ICD-10-CM | POA: Diagnosis not present

## 2016-05-01 DIAGNOSIS — I739 Peripheral vascular disease, unspecified: Secondary | ICD-10-CM | POA: Insufficient documentation

## 2016-05-01 DIAGNOSIS — G4733 Obstructive sleep apnea (adult) (pediatric): Secondary | ICD-10-CM | POA: Diagnosis not present

## 2016-05-01 DIAGNOSIS — E039 Hypothyroidism, unspecified: Secondary | ICD-10-CM | POA: Insufficient documentation

## 2016-05-01 DIAGNOSIS — I10 Essential (primary) hypertension: Secondary | ICD-10-CM | POA: Diagnosis not present

## 2016-05-08 DIAGNOSIS — E049 Nontoxic goiter, unspecified: Secondary | ICD-10-CM | POA: Diagnosis not present

## 2016-05-08 DIAGNOSIS — E0590 Thyrotoxicosis, unspecified without thyrotoxic crisis or storm: Secondary | ICD-10-CM | POA: Diagnosis not present

## 2016-05-08 DIAGNOSIS — I10 Essential (primary) hypertension: Secondary | ICD-10-CM | POA: Diagnosis not present

## 2016-05-08 DIAGNOSIS — E7800 Pure hypercholesterolemia, unspecified: Secondary | ICD-10-CM | POA: Diagnosis not present

## 2016-05-22 DIAGNOSIS — G5711 Meralgia paresthetica, right lower limb: Secondary | ICD-10-CM | POA: Diagnosis not present

## 2016-05-22 DIAGNOSIS — Z0000 Encounter for general adult medical examination without abnormal findings: Secondary | ICD-10-CM | POA: Diagnosis not present

## 2016-05-22 DIAGNOSIS — E559 Vitamin D deficiency, unspecified: Secondary | ICD-10-CM | POA: Diagnosis not present

## 2016-05-22 DIAGNOSIS — I129 Hypertensive chronic kidney disease with stage 1 through stage 4 chronic kidney disease, or unspecified chronic kidney disease: Secondary | ICD-10-CM | POA: Diagnosis not present

## 2016-05-22 DIAGNOSIS — N182 Chronic kidney disease, stage 2 (mild): Secondary | ICD-10-CM | POA: Diagnosis not present

## 2016-06-05 DIAGNOSIS — M25551 Pain in right hip: Secondary | ICD-10-CM | POA: Diagnosis not present

## 2016-06-05 DIAGNOSIS — M25562 Pain in left knee: Secondary | ICD-10-CM | POA: Diagnosis not present

## 2016-06-05 DIAGNOSIS — M25552 Pain in left hip: Secondary | ICD-10-CM | POA: Diagnosis not present

## 2016-06-05 DIAGNOSIS — M25561 Pain in right knee: Secondary | ICD-10-CM | POA: Diagnosis not present

## 2016-06-08 HISTORY — PX: TRANSTHORACIC ECHOCARDIOGRAM: SHX275

## 2016-06-20 DIAGNOSIS — M25552 Pain in left hip: Secondary | ICD-10-CM | POA: Diagnosis not present

## 2016-06-20 DIAGNOSIS — M25551 Pain in right hip: Secondary | ICD-10-CM | POA: Diagnosis not present

## 2016-07-01 ENCOUNTER — Ambulatory Visit (HOSPITAL_COMMUNITY): Admit: 2016-07-01 | Payer: Medicare Other | Admitting: Cardiovascular Disease

## 2016-07-01 ENCOUNTER — Emergency Department (HOSPITAL_COMMUNITY): Payer: Medicare Other

## 2016-07-01 ENCOUNTER — Encounter (HOSPITAL_COMMUNITY)
Admission: EM | Disposition: A | Discharge status: Home / Self Care | Payer: Self-pay | Source: Home / Self Care | Attending: Internal Medicine

## 2016-07-01 ENCOUNTER — Inpatient Hospital Stay (HOSPITAL_COMMUNITY)
Admission: EM | Admit: 2016-07-01 | Discharge: 2016-07-05 | DRG: 419 | Disposition: A | Discharge status: Home / Self Care | Payer: Medicare Other | Attending: Internal Medicine | Admitting: Internal Medicine

## 2016-07-01 ENCOUNTER — Encounter (HOSPITAL_COMMUNITY): Payer: Self-pay | Admitting: Emergency Medicine

## 2016-07-01 DIAGNOSIS — B353 Tinea pedis: Secondary | ICD-10-CM | POA: Diagnosis present

## 2016-07-01 DIAGNOSIS — R0602 Shortness of breath: Secondary | ICD-10-CM

## 2016-07-01 DIAGNOSIS — I7100 Dissection of unspecified site of aorta: Secondary | ICD-10-CM

## 2016-07-01 DIAGNOSIS — I10 Essential (primary) hypertension: Secondary | ICD-10-CM | POA: Diagnosis not present

## 2016-07-01 DIAGNOSIS — E0590 Thyrotoxicosis, unspecified without thyrotoxic crisis or storm: Secondary | ICD-10-CM | POA: Diagnosis present

## 2016-07-01 DIAGNOSIS — K8000 Calculus of gallbladder with acute cholecystitis without obstruction: Principal | ICD-10-CM | POA: Diagnosis present

## 2016-07-01 DIAGNOSIS — K8020 Calculus of gallbladder without cholecystitis without obstruction: Secondary | ICD-10-CM | POA: Diagnosis not present

## 2016-07-01 DIAGNOSIS — R05 Cough: Secondary | ICD-10-CM

## 2016-07-01 DIAGNOSIS — R079 Chest pain, unspecified: Secondary | ICD-10-CM | POA: Diagnosis not present

## 2016-07-01 DIAGNOSIS — M810 Age-related osteoporosis without current pathological fracture: Secondary | ICD-10-CM | POA: Diagnosis not present

## 2016-07-01 DIAGNOSIS — G4733 Obstructive sleep apnea (adult) (pediatric): Secondary | ICD-10-CM | POA: Diagnosis present

## 2016-07-01 DIAGNOSIS — R1013 Epigastric pain: Secondary | ICD-10-CM | POA: Diagnosis not present

## 2016-07-01 DIAGNOSIS — Z6839 Body mass index (BMI) 39.0-39.9, adult: Secondary | ICD-10-CM

## 2016-07-01 DIAGNOSIS — R9431 Abnormal electrocardiogram [ECG] [EKG]: Secondary | ICD-10-CM

## 2016-07-01 DIAGNOSIS — Z79899 Other long term (current) drug therapy: Secondary | ICD-10-CM

## 2016-07-01 DIAGNOSIS — S299XXA Unspecified injury of thorax, initial encounter: Secondary | ICD-10-CM | POA: Diagnosis not present

## 2016-07-01 DIAGNOSIS — R109 Unspecified abdominal pain: Secondary | ICD-10-CM

## 2016-07-01 DIAGNOSIS — R059 Cough, unspecified: Secondary | ICD-10-CM

## 2016-07-01 DIAGNOSIS — Z7982 Long term (current) use of aspirin: Secondary | ICD-10-CM

## 2016-07-01 LAB — CBC WITH DIFFERENTIAL/PLATELET
Basophils Absolute: 0 10*3/uL (ref 0.0–0.1)
Basophils Relative: 0 %
Eosinophils Absolute: 0.1 10*3/uL (ref 0.0–0.7)
Eosinophils Relative: 1 %
HCT: 34.8 % — ABNORMAL LOW (ref 36.0–46.0)
Hemoglobin: 12.1 g/dL (ref 12.0–15.0)
Lymphocytes Relative: 31 %
Lymphs Abs: 2.5 10*3/uL (ref 0.7–4.0)
MCH: 28.6 pg (ref 26.0–34.0)
MCHC: 34.8 g/dL (ref 30.0–36.0)
MCV: 82.3 fL (ref 78.0–100.0)
Monocytes Absolute: 0.8 10*3/uL (ref 0.1–1.0)
Monocytes Relative: 10 %
Neutro Abs: 4.6 10*3/uL (ref 1.7–7.7)
Neutrophils Relative %: 58 %
Platelets: 353 10*3/uL (ref 150–400)
RBC: 4.23 MIL/uL (ref 3.87–5.11)
RDW: 14.6 % (ref 11.5–15.5)
WBC: 8.1 10*3/uL (ref 4.0–10.5)

## 2016-07-01 LAB — COMPREHENSIVE METABOLIC PANEL
ALT: 14 U/L (ref 14–54)
AST: 18 U/L (ref 15–41)
Albumin: 3.4 g/dL — ABNORMAL LOW (ref 3.5–5.0)
Alkaline Phosphatase: 41 U/L (ref 38–126)
Anion gap: 7 (ref 5–15)
BUN: 19 mg/dL (ref 6–20)
CO2: 26 mmol/L (ref 22–32)
Calcium: 8.8 mg/dL — ABNORMAL LOW (ref 8.9–10.3)
Chloride: 103 mmol/L (ref 101–111)
Creatinine, Ser: 0.75 mg/dL (ref 0.44–1.00)
GFR calc Af Amer: >60 mL/min (ref >60)
GFR calc non Af Amer: >60 mL/min (ref >60)
Glucose, Bld: 115 mg/dL — ABNORMAL HIGH (ref 65–99)
Potassium: 3.2 mmol/L — ABNORMAL LOW (ref 3.5–5.1)
Sodium: 136 mmol/L (ref 135–145)
Total Bilirubin: 0.5 mg/dL (ref 0.3–1.2)
Total Protein: 6.7 g/dL (ref 6.5–8.1)

## 2016-07-01 LAB — LIPASE, BLOOD: Lipase: 38 U/L (ref 11–51)

## 2016-07-01 LAB — PROTIME-INR
INR: 0.99
Prothrombin Time: 13.1 seconds (ref 11.4–15.2)

## 2016-07-01 LAB — POCT I-STAT TROPONIN I: Troponin i, poc: 0 ng/mL (ref 0.00–0.08)

## 2016-07-01 LAB — TROPONIN I
Troponin I: <0.03 ng/mL (ref <0.03)
Troponin I: <0.03 ng/mL (ref <0.03)

## 2016-07-01 LAB — APTT: aPTT: 27 seconds (ref 24–36)

## 2016-07-01 SURGERY — INVASIVE LAB ABORTED CASE

## 2016-07-01 MED ORDER — IOPAMIDOL (ISOVUE-370) INJECTION 76%
INTRAVENOUS | Status: AC
  Filled 2016-07-01: qty 100

## 2016-07-01 MED ORDER — PANTOPRAZOLE SODIUM 40 MG PO TBEC
40.0000 mg | DELAYED_RELEASE_TABLET | Freq: Every day | ORAL | Status: DC
Start: 2016-07-01 — End: 2016-07-01

## 2016-07-01 MED ORDER — FENTANYL CITRATE (PF) 100 MCG/2ML IJ SOLN
50.0000 ug | INTRAMUSCULAR | Status: DC | PRN
  Administered 2016-07-01 (×2): 50 ug via INTRAVENOUS
  Filled 2016-07-01 (×2): qty 2

## 2016-07-01 MED ORDER — ONDANSETRON HCL 4 MG/2ML IJ SOLN
INTRAMUSCULAR | Status: AC
  Filled 2016-07-01: qty 2

## 2016-07-01 MED ORDER — ONDANSETRON HCL 4 MG/2ML IJ SOLN
4.0000 mg | INTRAMUSCULAR | Status: DC | PRN
Start: 2016-07-01 — End: 2016-07-05
  Administered 2016-07-01: 4 mg via INTRAVENOUS

## 2016-07-01 MED ORDER — FENTANYL CITRATE (PF) 100 MCG/2ML IJ SOLN
50.0000 ug | INTRAMUSCULAR | Status: DC | PRN
  Administered 2016-07-01: 50 ug via INTRAVENOUS
  Administered 2016-07-02: 100 ug via INTRAVENOUS
  Administered 2016-07-02: 50 ug via INTRAVENOUS
  Filled 2016-07-01 (×3): qty 2

## 2016-07-01 MED ORDER — FAMOTIDINE IN NACL 20-0.9 MG/50ML-% IV SOLN
20.0000 mg | INTRAVENOUS | Status: DC
  Administered 2016-07-01 – 2016-07-02 (×2): 20 mg via INTRAVENOUS
  Filled 2016-07-01 (×3): qty 50

## 2016-07-01 MED ORDER — VERAPAMIL HCL 2.5 MG/ML IV SOLN
INTRAVENOUS | Status: AC
  Filled 2016-07-01: qty 2

## 2016-07-01 MED ORDER — SODIUM CHLORIDE 0.9 % IV SOLN: INTRAVENOUS | Status: DC

## 2016-07-01 MED ORDER — HEPARIN (PORCINE) IN NACL 2-0.9 UNIT/ML-% IJ SOLN
INTRAMUSCULAR | Status: AC
  Filled 2016-07-01: qty 1000

## 2016-07-01 MED ORDER — HEPARIN SODIUM (PORCINE) 1000 UNIT/ML IJ SOLN
INTRAMUSCULAR | Status: AC
  Filled 2016-07-01: qty 1

## 2016-07-01 MED ORDER — HYDROMORPHONE HCL 1 MG/ML IJ SOLN
1.0000 mg | INTRAMUSCULAR | Status: DC | PRN
Start: 2016-07-01 — End: 2016-07-02
  Administered 2016-07-01 – 2016-07-02 (×2): 1 mg via INTRAVENOUS
  Filled 2016-07-01 (×2): qty 1

## 2016-07-01 MED ORDER — OXYCODONE-ACETAMINOPHEN 5-325 MG PO TABS: 1.0000 | ORAL_TABLET | ORAL | 0 refills | Status: DC | PRN

## 2016-07-01 MED ORDER — METOPROLOL SUCCINATE ER 25 MG PO TB24
25.0000 mg | ORAL_TABLET | Freq: Every day | ORAL | Status: DC
  Administered 2016-07-02 – 2016-07-05 (×4): 25 mg via ORAL
  Filled 2016-07-01 (×4): qty 1

## 2016-07-01 MED ORDER — MORPHINE SULFATE (PF) 4 MG/ML IV SOLN
4.0000 mg | Freq: Once | INTRAVENOUS | Status: AC
  Administered 2016-07-01: 4 mg via INTRAVENOUS
  Filled 2016-07-01: qty 1

## 2016-07-01 MED ORDER — METHIMAZOLE 5 MG PO TABS
5.0000 mg | ORAL_TABLET | Freq: Every day | ORAL | Status: DC
  Administered 2016-07-02 – 2016-07-05 (×4): 5 mg via ORAL
  Filled 2016-07-01 (×5): qty 1

## 2016-07-01 MED ORDER — SODIUM CHLORIDE 0.9 % IV BOLUS (SEPSIS)
1000.0000 mL | Freq: Once | INTRAVENOUS | Status: AC
  Administered 2016-07-01: 1000 mL via INTRAVENOUS

## 2016-07-01 MED ORDER — IOPAMIDOL (ISOVUE-370) INJECTION 76%
INTRAVENOUS | Status: AC
  Filled 2016-07-01: qty 125

## 2016-07-01 MED ORDER — LIDOCAINE HCL (PF) 1 % IJ SOLN
INTRAMUSCULAR | Status: AC
  Filled 2016-07-01: qty 30

## 2016-07-01 MED ORDER — GI COCKTAIL ~~LOC~~
30.0000 mL | Freq: Once | ORAL | Status: AC
  Administered 2016-07-01: 30 mL via ORAL
  Filled 2016-07-01: qty 30

## 2016-07-01 MED ORDER — IOPAMIDOL (ISOVUE-370) INJECTION 76%
INTRAVENOUS | Status: AC
  Administered 2016-07-01: 100 mL
  Filled 2016-07-01: qty 100

## 2016-07-01 MED ORDER — MORPHINE SULFATE (PF) 4 MG/ML IV SOLN: 4.0000 mg | INTRAVENOUS | Status: DC | PRN

## 2016-07-01 SURGICAL SUPPLY — 5 items
KIT HEART LEFT (KITS) ×1 IMPLANT
PACK CARDIAC CATHETERIZATION (CUSTOM PROCEDURE TRAY) ×1 IMPLANT
SYR MEDRAD MARK V 150ML (SYRINGE) ×1 IMPLANT
TRANSDUCER W/STOPCOCK (MISCELLANEOUS) ×1 IMPLANT
TUBING CIL FLEX 10 FLL-RA (TUBING) ×1 IMPLANT

## 2016-07-01 NOTE — ED Triage Notes (Signed)
To ED via [facility] from home, with c/o upper epigastric pain-- nonradiating-- pt started having pain and nausea this am, went to church, started vomiting when she got home from church. Vomited x 2.   Pt received Zofran 4mg , ASA 324mg , Morphine 6mg  and NTG x 2 enroute-- pain remains 10/10.

## 2016-07-01 NOTE — ED Notes (Signed)
  Morphine 4mg  IV given -- unable to scan at this time,

## 2016-07-01 NOTE — H&P (Signed)
History and Physical    [name] [id number] DOB: 1932/11/16 DOA: 07/01/2016  PCP: Maximino Greenland, MD  Patient coming from:   Home    Chief Complaint:  Abdominal pain and vomiting  HPI: [name] is a 82 y.o. female with a medical history significant for, but not  limited to, hypothyroidism, HTN, and OSA.  Patient brought to ED by EMS this morning for evaluation of nonradiating epigastric pain and vomiting. Started acutely this morning while at church. Patient consumed fatty foods yesterday which she does not typically do. The epigastric pain was severe, nonradiating. Pain has persisted despite IV narcotics in the emergency department. Pain is constant constant She had similar pain about a year ago, told it was likely acid reflux and pain eventually resolved with dietary modifications. Prior to this morning patient was in her usual state of health. She lives alone, cooks for herself, and ambulates without any assistance.   ED Course:  Afebrile, heart rate in the fifties, normotensive, oxygen saturation 93% on room air Potassium 3.2, troponin less than 0.03, normal renal function. Normal LFTs and normal lipase . CBC normal. Chest x-ray shows low lung volumes with mild pulmonary vascular congestion EKG compatible with ST elevation, consider inferior injury. A code STEMI was called but later canceled CT chest abdomen and pelvis for dissection was negative for PE or dissection. Rx: Patient has received 2 doses of fentanyl and just received 4 mg of IV morphine. Her pain is still rated as 10/10.   Review of Systems: As per HPI, otherwise 10 point review of systems negative.   Past Medical History:  Diagnosis Date  . Bronchiectasis    isolated to RML; status post right middle lobe partial resection.  . H/O osteoporosis   . Hypertension   . Hypothyroidism   . OSA (obstructive sleep apnea)   . Yeast infection     Past Surgical History:  Procedure Laterality Date  . ABDOMINAL  HYSTERECTOMY  1974  . HERNIA REPAIR  1975  . LUNG REMOVAL, PARTIAL  208   RML  . NM MYOVIEW LTD  September 2012   Normal EF. No ischemia or infarction.  Domingo Dimes ECHOCARDIOGRAM  September 2012   Mild concentric LVH. Normal EF with impaired relaxation. Mildly elevated RV pressures of 30 and 40 mmHg.    Social History   Social History  . Marital status: Widowed    Spouse name: N/A  . Number of children: N/A  . Years of education: N/A   Occupational History  . Not on file.   Social History Main Topics  . Smoking status: Never Smoker  . Smokeless tobacco: Never Used  . Alcohol use No  . Drug use: No  . Sexual activity: No   Other Topics Concern  . Not on file   Social History Narrative   She is a widowed mother of 37, grandmother for, a great-grandmother 32.   She has been trying to do more exercise than before, but has been injured recently by her back pain.   She never smoked, doesn't drink alcohol.  lives alone. No assistive devices needed for ambulation  Allergies  Allergen Reactions  . Ibuprofen Other (See Comments)    hallucinations  . Naproxen Sodium Other (See Comments)    hallucinations    Family History  Problem Relation Age of Onset  . Hypertension Mother   . CVA Mother 37  . Heart attack Sister   . Diabetes Sister   . Heart attack  Brother   . Heart attack Brother     Prior to Admission medications   Medication Sig Start Date End Date Taking? Authorizing Provider  acetaminophen (TYLENOL) 650 MG CR tablet Take 1,300 mg by mouth every 8 (eight) hours as needed for pain.    Yes Historical Provider, MD  aspirin EC 81 MG tablet Take 81 mg by mouth at bedtime.   Yes Historical Provider, MD  CALCIUM-MAGNESUIUM-ZINC 333-133-8.3 MG TABS Take 1 tablet by mouth daily.   Yes Historical Provider, MD  cholecalciferol (VITAMIN D) 1000 UNITS tablet Take 1,000 Units by mouth daily.   Yes Historical Provider, MD  fenofibrate (TRICOR) 145 MG tablet Take 145 mg by  mouth daily.   Yes Historical Provider, MD  Flaxseed, Linseed, (FLAX SEED OIL PO) Take 1 capsule by mouth daily.    Yes Historical Provider, MD  furosemide (LASIX) 20 MG tablet Take 20 mg by mouth daily.     Yes Historical Provider, MD  lidocaine (LIDODERM) 5 % Place 1 patch onto the skin daily as needed (hip pain).  05/22/16  Yes Historical Provider, MD  methimazole (TAPAZOLE) 5 MG tablet Take 5 mg by mouth daily.   Yes Historical Provider, MD  metoprolol succinate (TOPROL-XL) 25 MG 24 hr tablet Take 25 mg by mouth daily. 05/29/16  Yes Historical Provider, MD  nystatin cream (MYCOSTATIN) Apply 1 application topically daily. Apply under breasts and on stomach folds 06/13/16  Yes Historical Provider, MD  pantoprazole (PROTONIX) 40 MG tablet Take 1 tablet (40 mg total) by mouth daily. 02/28/15  Yes Kristen N Ward, DO  potassium chloride SA (K-DUR,KLOR-CON) 20 MEQ tablet Take 20 mEq by mouth daily.    Yes Historical Provider, MD  vitamin C (ASCORBIC ACID) 500 MG tablet Take 500 mg by mouth daily.   Yes Historical Provider, MD  oxyCODONE-acetaminophen (PERCOCET/ROXICET) 5-325 MG tablet Take 1 tablet by mouth every 4 (four) hours as needed for severe pain. 07/01/16   Leo Grosser, MD    Physical Exam: Vitals:   07/01/16 1213 07/01/16 1214 07/01/16 1215 07/01/16 1249  BP: 102/63  90/58 110/65  Pulse: (!) 57  (!) 56 (!) 54  Resp: 16  26   Temp: 97.8 F (36.6 C)     TempSrc: Oral     SpO2: 93%  (!) 87%   Weight:  94.3 kg (208 lb)    Height:  5\' 1"  (1.549 m)      Constitutional:  Pleasant, obese lack female in NAD, calm, comfortable Vitals:   07/01/16 1213 07/01/16 1214 07/01/16 1215 07/01/16 1249  BP: 102/63  90/58 110/65  Pulse: (!) 57  (!) 56 (!) 54  Resp: 16  26   Temp: 97.8 F (36.6 C)     TempSrc: Oral     SpO2: 93%  (!) 87%   Weight:  94.3 kg (208 lb)    Height:  5\' 1"  (1.549 m)     Eyes: PER, lids and conjunctivae normal ENMT: Mucous membranes are moist. Posterior pharynx clear of  any exudate or lesions..  Neck: normal, supple, no masses Respiratory: clear to auscultation bilaterally, no wheezing, no crackles. Normal respiratory effort. No accessory muscle use.  Cardiovascular: Regular rate and rhythm, no murmurs / rubs / gallops. No extremity edema. 2+ dorsal pedis pulses.   Abdomen: no tenderness, no masses palpated. No hepatomegaly. Bowel sounds positive.  Musculoskeletal: no clubbing / cyanosis. No joint deformity upper and lower extremities. Good ROM, no contractures. Normal muscle tone.  Skin:  no rashes, lesions, ulcers.  Neurologic: CN 2-12 grossly intact. Sensation intact, Strength 5/5 in all 4.  Psychiatric: Normal judgment and insight. Alert and oriented x 3. Normal mood.    Labs on Admission: I have personally reviewed following labs and imaging studies   Radiological Exams on Admission: Dg Chest Port 1 View  Result Date: 07/01/2016 CLINICAL DATA:  Posttraumatic pain after trauma. 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 EXAM: PORTABLE CHEST 1 VIEW COMPARISON:  12/30/2008 chest CT, 06/28/2010 chest radiograph FINDINGS: Lung volumes are low with bibasilar atelectasis and crowding of interstitial lung markings. No pneumonic consolidations. There is mild vascular congestion. Heart is normal in size. No aortic aneurysm. No acute osseous abnormality. Surgical clips are seen about the right infrahilar portion of the chest. IMPRESSION: Low lung volumes with mild pulmonary vascular congestion. Stable postsurgical changes about the right hilum. Electronically Signed   By: Ashley Royalty M.D.   On: 07/01/2016 12:57   Ct Angio Chest/abd/pel For Dissection W And/or W/wo  Result Date: 07/01/2016 CLINICAL DATA:  Pt having severe mid abd epigastric pain that started this AM. EXAM: CT ANGIOGRAPHY CHEST, ABDOMEN AND PELVIS TECHNIQUE: Multidetector CT imaging through the chest, abdomen and pelvis was performed using the standard protocol  during bolus administration of intravenous contrast. Multiplanar reconstructed images and MIPs were obtained and reviewed to evaluate the vascular anatomy. CONTRAST:  100 ml iso 370. COMPARISON:  None. FINDINGS: CTA CHEST FINDINGS Cardiovascular: Left arm IV contrast administration. Innominate vein and SVC patent. Dilated central pulmonary arteries. Satisfactory opacification of pulmonary arteries noted, and there is no evidence of pulmonary emboli. Patent bilateral pulmonary veins drain into left atrium. Adequate contrast opacification of the thoracic aorta with no evidence of dissection, aneurysm, or stenosis. There is bovine variant brachiocephalic arch anatomy without proximal stenosis. Tortuous right brachiocephalic and proximal left subclavian arteries. Minimal scattered calcified plaque in the aortic arch and descending thoracic segment. Mediastinum/Nodes: Enlarged heterogeneous thyroid with left lesion at least 2.6 cm. No hilar or mediastinal adenopathy. Calcified bilateral hilar lymph nodes. No pericardial effusion. Lungs/Pleura: Dependent atelectasis posteriorly in both lungs. Postop changes at the right hilum. No confluent airspace disease. Calcified granuloma in the superior segment left lower lobe. Musculoskeletal: Mild spondylitic changes throughout the thoracic spine. Negative for fracture. Review of the MIP images confirms the above findings. CTA ABDOMEN AND PELVIS FINDINGS VASCULAR Aorta: Scattered calcified plaque. No aneurysm, dissection, or stenosis. Celiac: Patent without evidence of aneurysm, dissection, vasculitis or significant stenosis. SMA: Patent without evidence of aneurysm, dissection, vasculitis or significant stenosis. Renals: Both renal arteries are patent without evidence of aneurysm, dissection, vasculitis, fibromuscular dysplasia or significant stenosis. IMA: Patent without evidence of aneurysm, dissection, vasculitis or significant stenosis. Inflow: Scattered calcified plaque  through bilateral common iliac arteries and internal iliac arteries. No aneurysm, dissection, or stenosis. Veins: No obvious venous abnormality within the limitations of this arterial phase study. Review of the MIP images confirms the above findings. NON-VASCULAR Hepatobiliary: 16 mm enhancing subcapsular lesion in the left lobe image 117/501, stable since scans from 08/19/2006, presumably benign lesion such as FNH. No new liver lesion. Partially calcified stones in the dependent aspect of the nondilated gallbladder. No intra or extrahepatic biliary ductal dilatation. Pancreas: Unremarkable Spleen: Unremarkable arterial phase Adrenals/Urinary Tract: Normal adrenals. No hydronephrosis. No renal mass. Urinary bladder nondistended. Stomach/Bowel: Stomach and small bowel nondilated. Normal appendix. Colonic diverticula most numerous in the sigmoid segment, without significant adjacent inflammatory/ edematous change. Lymphatic: No adenopathy  localized. Musculoskeletal: Mild lumbar dextroscoliosis with multilevel degenerative changes. Bilateral hip osteoarthritis. Negative for fracture. Other: No ascites.  No free air. Review of the MIP images confirms the above findings. IMPRESSION: 1. Negative for acute PE or aortic dissection. 2. Dilated central pulmonary arteries suggesting pulmonary hypertension. 3. Cholelithiasis 4. Sigmoid diverticulosis 5. Thyromegaly with nodules Electronically Signed   By: Lucrezia Europe M.D.   On: 07/01/2016 14:48   US Abdomen Limited Ruq  Result Date: 07/01/2016 CLINICAL DATA:  Upper abdominal pain EXAM: US ABDOMEN LIMITED - RIGHT UPPER QUADRANT COMPARISON:  CT abdomen and pelvis Feb 28, 2015 ; CT angiogram chest including portions of the liver July 01, 2016 FINDINGS: Gallbladder: There is a 1.5 cm calculus adherent in the neck of the gallbladder. There is no gallbladder wall thickening or pericholecystic fluid. There is slight sludge in the gallbladder. No sonographic Murphy sign noted  by sonographer. Common bile duct: Diameter: 5 mm. There is no intrahepatic or extrahepatic biliary duct dilatation. Liver: No focal lesion identified. Liver echogenicity is somewhat increased. IMPRESSION: There is a 1.5 cm in length calculus adherent in the neck of the gallbladder. Mild sludge is noted in the gallbladder. No gallbladder wall thickening or pericholecystic fluid. Increased liver echogenicity is present, a finding most likely indicative of a degree of hepatic steatosis. No focal liver lesions are evident by ultrasound. Note that no lesion is identified near the junction of the right and left lobes as was noted on CT examination performed earlier in the day. It must be cautioned that the sensitivity of ultrasound for focal liver lesions is diminished in this circumstance. Electronically Signed   By: Lowella Grip III M.D.   On: 07/01/2016 15:36    EKG: Independently reviewed.   EKG Interpretation  Date/Time:  Sunday July 01 2016 12:08:13 EDT Ventricular Rate:  54 PR Interval:    QRS Duration: 95 QT Interval:  472 QTC Calculation: 448 R Axis:   58 Text Interpretation:  Sinus rhythm ST elevation, consider early repolarization, pericarditis, or injury similar to previous Confirmed by KNOTT MD, DANIEL [phone]) on 07/01/2016 12:31:47 PM      Assessment/Plan   Principal Problem:   Symptomatic cholelithiasis Active Problems:   Essential hypertension   Hyperthyroidism      Acute severe, nonradiating epigastric pain , nausea /vomiting. Code STEMI called for abnormal ECG findings but this was cancelled.  Probable symptomatic cholelithiasis. Ultrasound and CTA reveal cholelithiasis with a 1.5 centimeter gallstone in the neck of the gallbladder. No evidence for cholecystitis nor choledocholithiasis. No evidence for pancreatitis . EDP has spoken with surgery who also suspect symptomatic cholelithiasis. Patient's pain has been difficult to control.  Surgery recommending medical  admission with a surgical consult.           -place in OBS - medical - changed from fentanyl to morphine, received dose about 20 minutes ago and seems to be comfortable at present. Will continue morphine as needed. Patient doesn't take Narcs at home so placing on continuous pulse ox.to monitor sats -Gentle IV hydration to replace fluid losses after several episodes of vomiting today. Giving only 50 ml /hr as there is mild pulmonary vasculature congestion on CXR. If she gets SOB would repeat CXR as patient vomited and had an episode of hypoxia in ED (? Aspiration)  -IV Pepcid -A.m. CMET  Abnormal ECG -Sinus rhythm ST elevation, consider early repolarization, pericarditis, or injury similar to previous . Originally Code STEMI called but Cardiology cancelled.  -telemetry -trend tropoinins -  repeat ECG in am  Hyperthyroidism . -continue tapazole  HTN, controlled in ED.  -continue home beta blocker   DVT prophylaxis:       SQ Heparin Code Status:     Full code   Family Communication:    Treatment plan discussed with daughter in the room and she understands and agrees with the plan..  Disposition Plan:   Discharge home in 24-48 hours              Consults called:  EDP spoke with General Surgery Dr. Ninfa Linden.  Admission status:   Observation -  Telemetry    Tye Savoy NP [facility] Pager [phone]  If 7PM-7AM, please contact night-coverage [email or web address] Password [facility]  07/01/2016, 4:49 PM

## 2016-07-01 NOTE — ED Notes (Signed)
Pt O2 sats 88% -- placed on 2 L/M/[place]--

## 2016-07-01 NOTE — ED Notes (Signed)
Dr. Burt Knack at bedside. Code Stemi cancelled.

## 2016-07-01 NOTE — ED Notes (Signed)
1224 NSS bolus 1000 ML infusing

## 2016-07-01 NOTE — ED Provider Notes (Signed)
[facility] DEPT Provider Note   CSN: [id number] Arrival date & time: 07/01/16  1204     History   Chief Complaint Chief Complaint  Patient presents with  . Chest Pain    HPI [name] is a 82 y.o. female.  The history is provided by the patient.  Abdominal Pain   This is a new problem. The current episode started 6 to 12 hours ago. The problem occurs constantly. The problem has been gradually worsening. The pain is associated with an unknown factor. The pain is located in the epigastric region. The quality of the pain is aching and sharp. The pain is severe. Associated symptoms include nausea and vomiting. Pertinent negatives include constipation. The symptoms are aggravated by palpation. Nothing relieves the symptoms. Past workup includes CT scan. Past workup comments: last year showing gallstones.    Past Medical History:  Diagnosis Date  . Bronchiectasis    isolated to RML; status post right middle lobe partial resection.  . H/O osteoporosis   . Hypertension   . Hypothyroidism   . OSA (obstructive sleep apnea)   . Yeast infection     Patient Active Problem List   Diagnosis Date Noted  . Lower leg edema 02/16/2015  . Obesity (BMI 30.0-34.9) 02/16/2015  . Musculoskeletal chest pain 12/16/2013  . Osteoporosis 01/11/2012  . Essential hypertension 01/11/2012  . Hyperthyroidism 01/11/2012  . Hypercholesteremia 01/11/2012  . H/O vaginal hysterectomy 1974 01/11/2012    Class: History of  . S/P removal of lung  partial R lobe  2010 01/11/2012  . History of hernia repair  R ing. 1975 01/11/2012  . BACK PAIN 11/12/2007  . DYSPNEA 11/11/2007    Past Surgical History:  Procedure Laterality Date  . ABDOMINAL HYSTERECTOMY  1974  . HERNIA REPAIR  1975  . LUNG REMOVAL, PARTIAL  208   RML  . NM MYOVIEW LTD  September 2012   Normal EF. No ischemia or infarction.  Domingo Dimes ECHOCARDIOGRAM  September 2012   Mild concentric LVH. Normal EF with impaired  relaxation. Mildly elevated RV pressures of 30 and 40 mmHg.    OB History    Gravida Para Term Preterm AB Living   4 4           SAB TAB Ectopic Multiple Live Births                   Home Medications    Prior to Admission medications   Medication Sig Start Date End Date Taking? Authorizing Provider  acetaminophen (TYLENOL) 650 MG CR tablet Take 650 mg by mouth every 8 (eight) hours as needed for pain.    Historical Provider, MD  Ascorbic Acid (VITAMIN C PO) Take 500 mg by mouth daily.     Historical Provider, MD  aspirin 81 MG tablet Take 81 mg by mouth daily.      Historical Provider, MD  Azelaic Acid 15 % cream Apply 1 application topically 2 (two) times daily. After skin is thoroughly washed and patted dry, gently but thoroughly massage a thin film of azelaic acid cream into the affected area twice daily, in the morning and evening.    Historical Provider, MD  CALCIUM-MAGNESUIUM-ZINC 333-133-8.3 MG TABS Take 1 tablet by mouth daily.    Historical Provider, MD  cholecalciferol (VITAMIN D) 1000 UNITS tablet Take 1,000 Units by mouth daily.    Historical Provider, MD  fenofibrate (TRICOR) 145 MG tablet Take 145 mg by mouth daily.    Historical  Provider, MD  Flaxseed, Linseed, (FLAX SEED OIL PO) Take 1 tablet by mouth daily.    Historical Provider, MD  furosemide (LASIX) 20 MG tablet Take 20 mg by mouth daily.      Historical Provider, MD  methimazole (TAPAZOLE) 5 MG tablet Take 5 mg by mouth daily.    Historical Provider, MD  metoprolol tartrate (LOPRESSOR) 25 MG tablet Take 25 mg by mouth daily.      Historical Provider, MD  oxyCODONE-acetaminophen (PERCOCET/ROXICET) 5-325 MG per tablet Take 1 tablet by mouth every 6 (six) hours as needed. 02/28/15   Kristen N Ward, DO  pantoprazole (PROTONIX) 40 MG tablet Take 1 tablet (40 mg total) by mouth daily. 02/28/15   Kristen N Ward, DO  potassium chloride SA (K-DUR,KLOR-CON) 20 MEQ tablet Take 20 mEq by mouth daily.     Historical Provider, MD      Family History Family History  Problem Relation Age of Onset  . Hypertension Mother   . CVA Mother 21  . Heart attack Sister   . Diabetes Sister   . Heart attack Brother   . Heart attack Brother     Social History Social History  Substance Use Topics  . Smoking status: Never Smoker  . Smokeless tobacco: Never Used  . Alcohol use No     Allergies   Ibuprofen and Naproxen sodium   Review of Systems Review of Systems  Gastrointestinal: Positive for abdominal pain, nausea and vomiting. Negative for constipation.  All other systems reviewed and are negative.    Physical Exam Updated Vital Signs BP 131/81   Pulse 62   Temp 97.8 F (36.6 C) (Oral)   Resp 18   Ht 5\' 1"  (1.549 m)   Wt 208 lb (94.3 kg)   SpO2 95%   BMI 39.30 kg/m   Physical Exam  Constitutional: She is oriented to person, place, and time. She appears well-developed and well-nourished. She appears distressed (with severe pain and moaning).  HENT:  Head: Normocephalic.  Eyes: Conjunctivae are normal.  Neck: Neck supple. No tracheal deviation present.  Cardiovascular: Normal rate, regular rhythm and normal heart sounds.  Exam reveals no friction rub.   No murmur heard. Pulmonary/Chest: Effort normal and breath sounds normal. No respiratory distress.  Abdominal: Soft. She exhibits no distension. There is tenderness (moderate epigastric).  Neurological: She is alert and oriented to person, place, and time.  Skin: Skin is warm and dry.  Psychiatric: She has a normal mood and affect.  Vitals reviewed.    ED Treatments / Results  Labs (all labs ordered are listed, but only abnormal results are displayed) Labs Reviewed  COMPREHENSIVE METABOLIC PANEL - Abnormal; Notable for the following:       Result Value   Potassium 3.2 (*)    Glucose, Bld 115 (*)    Calcium 8.8 (*)    Albumin 3.4 (*)    All other components within normal limits  CBC WITH DIFFERENTIAL/PLATELET - Abnormal; Notable for the  following:    HCT 34.8 (*)    All other components within normal limits  PROTIME-INR  APTT  TROPONIN I  LIPASE, BLOOD  CBC  COMPREHENSIVE METABOLIC PANEL  TROPONIN I  TROPONIN I  TROPONIN I  I-STAT TROPOININ, ED  POCT I-STAT TROPONIN I  I-STAT CG4 LACTIC ACID, ED    EKG  EKG Interpretation  Date/Time:  Sunday July 01 2016 12:08:13 EDT Ventricular Rate:  54 PR Interval:    QRS Duration: 95 QT Interval:  472 QTC Calculation: 448 R Axis:   58 Text Interpretation:  Sinus rhythm ST elevation, consider early repolarization, pericarditis, or injury similar to previous Confirmed by Karthika Glasper MD, Royann Wildasin [phone]) on 07/01/2016 12:31:47 PM       Radiology Dg Chest Port 1 View  Result Date: 07/01/2016 CLINICAL DATA:  Posttraumatic pain after trauma. 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 EXAM: PORTABLE CHEST 1 VIEW COMPARISON:  12/30/2008 chest CT, 06/28/2010 chest radiograph FINDINGS: Lung volumes are low with bibasilar atelectasis and crowding of interstitial lung markings. No pneumonic consolidations. There is mild vascular congestion. Heart is normal in size. No aortic aneurysm. No acute osseous abnormality. Surgical clips are seen about the right infrahilar portion of the chest. IMPRESSION: Low lung volumes with mild pulmonary vascular congestion. Stable postsurgical changes about the right hilum. Electronically Signed   By: Ashley Royalty M.D.   On: 07/01/2016 12:57   Ct Angio Chest/abd/pel For Dissection W And/or W/wo  Result Date: 07/01/2016 CLINICAL DATA:  Pt having severe mid abd epigastric pain that started this AM. EXAM: CT ANGIOGRAPHY CHEST, ABDOMEN AND PELVIS TECHNIQUE: Multidetector CT imaging through the chest, abdomen and pelvis was performed using the standard protocol during bolus administration of intravenous contrast. Multiplanar reconstructed images and MIPs were obtained and reviewed to evaluate the vascular anatomy. CONTRAST:   100 ml iso 370. COMPARISON:  None. FINDINGS: CTA CHEST FINDINGS Cardiovascular: Left arm IV contrast administration. Innominate vein and SVC patent. Dilated central pulmonary arteries. Satisfactory opacification of pulmonary arteries noted, and there is no evidence of pulmonary emboli. Patent bilateral pulmonary veins drain into left atrium. Adequate contrast opacification of the thoracic aorta with no evidence of dissection, aneurysm, or stenosis. There is bovine variant brachiocephalic arch anatomy without proximal stenosis. Tortuous right brachiocephalic and proximal left subclavian arteries. Minimal scattered calcified plaque in the aortic arch and descending thoracic segment. Mediastinum/Nodes: Enlarged heterogeneous thyroid with left lesion at least 2.6 cm. No hilar or mediastinal adenopathy. Calcified bilateral hilar lymph nodes. No pericardial effusion. Lungs/Pleura: Dependent atelectasis posteriorly in both lungs. Postop changes at the right hilum. No confluent airspace disease. Calcified granuloma in the superior segment left lower lobe. Musculoskeletal: Mild spondylitic changes throughout the thoracic spine. Negative for fracture. Review of the MIP images confirms the above findings. CTA ABDOMEN AND PELVIS FINDINGS VASCULAR Aorta: Scattered calcified plaque. No aneurysm, dissection, or stenosis. Celiac: Patent without evidence of aneurysm, dissection, vasculitis or significant stenosis. SMA: Patent without evidence of aneurysm, dissection, vasculitis or significant stenosis. Renals: Both renal arteries are patent without evidence of aneurysm, dissection, vasculitis, fibromuscular dysplasia or significant stenosis. IMA: Patent without evidence of aneurysm, dissection, vasculitis or significant stenosis. Inflow: Scattered calcified plaque through bilateral common iliac arteries and internal iliac arteries. No aneurysm, dissection, or stenosis. Veins: No obvious venous abnormality within the limitations of  this arterial phase study. Review of the MIP images confirms the above findings. NON-VASCULAR Hepatobiliary: 16 mm enhancing subcapsular lesion in the left lobe image 117/501, stable since scans from 08/19/2006, presumably benign lesion such as FNH. No new liver lesion. Partially calcified stones in the dependent aspect of the nondilated gallbladder. No intra or extrahepatic biliary ductal dilatation. Pancreas: Unremarkable Spleen: Unremarkable arterial phase Adrenals/Urinary Tract: Normal adrenals. No hydronephrosis. No renal mass. Urinary bladder nondistended. Stomach/Bowel: Stomach and small bowel nondilated. Normal appendix. Colonic diverticula most numerous in the sigmoid segment, without significant adjacent inflammatory/ edematous change. Lymphatic: No adenopathy localized. Musculoskeletal: Mild lumbar dextroscoliosis with multilevel degenerative changes.  Bilateral hip osteoarthritis. Negative for fracture. Other: No ascites.  No free air. Review of the MIP images confirms the above findings. IMPRESSION: 1. Negative for acute PE or aortic dissection. 2. Dilated central pulmonary arteries suggesting pulmonary hypertension. 3. Cholelithiasis 4. Sigmoid diverticulosis 5. Thyromegaly with nodules Electronically Signed   By: Lucrezia Europe M.D.   On: 07/01/2016 14:48   US Abdomen Limited Ruq  Result Date: 07/01/2016 CLINICAL DATA:  Upper abdominal pain EXAM: US ABDOMEN LIMITED - RIGHT UPPER QUADRANT COMPARISON:  CT abdomen and pelvis Feb 28, 2015 ; CT angiogram chest including portions of the liver July 01, 2016 FINDINGS: Gallbladder: There is a 1.5 cm calculus adherent in the neck of the gallbladder. There is no gallbladder wall thickening or pericholecystic fluid. There is slight sludge in the gallbladder. No sonographic Murphy sign noted by sonographer. Common bile duct: Diameter: 5 mm. There is no intrahepatic or extrahepatic biliary duct dilatation. Liver: No focal lesion identified. Liver echogenicity  is somewhat increased. IMPRESSION: There is a 1.5 cm in length calculus adherent in the neck of the gallbladder. Mild sludge is noted in the gallbladder. No gallbladder wall thickening or pericholecystic fluid. Increased liver echogenicity is present, a finding most likely indicative of a degree of hepatic steatosis. No focal liver lesions are evident by ultrasound. Note that no lesion is identified near the junction of the right and left lobes as was noted on CT examination performed earlier in the day. It must be cautioned that the sensitivity of ultrasound for focal liver lesions is diminished in this circumstance. Electronically Signed   By: Lowella Grip III M.D.   On: 07/01/2016 15:36    Procedures Procedures (including critical care time)  Emergency Focused Ultrasound Exam Limited Ultrasound of the Heart and Pericardium  Performed and interpreted by Dr. Laneta Simmers Indication: epigastric pain Multiple views of the heart, pericardium, and IVC are obtained with a multi frequency probe.  Findings: nml contractility, minimal anechoic fluid, no IVC collapse Interpretation: nml ejection fraction, small pericardial effusion, elevated CVP Images archived electronically.  CPT Code: [phone]  Emergency Focused Ultrasound Exam Limited Retroperitoneal Ultrasound of the Abdominal Aorta.   Performed and interpreted by Dr. Laneta Simmers Indication: abdominal pain Multiple views of the abdominal aorta are obtained from the diaphragmatic hiatus to the aortic bifurcation in transverse and sagittal planes with a multi-frequency probe.  Findings: largest dimensions 1.75 x 1.59 cm, no dissection flap noted Interpretation: no abdominal aortic aneurysm, no dissection visualized, no signs of impending rupture Images archived electronically. CPT Code: [phone]   Medications Ordered in ED Medications  fentaNYL (SUBLIMAZE) injection 50 mcg (50 mcg Intravenous Given 07/01/16 1427)  ondansetron (ZOFRAN) injection 4 mg (4  mg Intravenous Given 07/01/16 1247)  fentaNYL (SUBLIMAZE) injection 50 mcg (not administered)  metoprolol succinate (TOPROL-XL) 24 hr tablet 25 mg (not administered)  methimazole (TAPAZOLE) tablet 5 mg (not administered)  famotidine (PEPCID) IVPB 20 mg premix (not administered)  morphine 4 MG/ML injection 4 mg (not administered)  gi cocktail (Maalox,Lidocaine,Donnatal) (30 mLs Oral Given by Other 07/01/16 1218)  sodium chloride 0.9 % bolus 1,000 mL ( Intravenous MAR Unhold 07/01/16 1246)  iopamidol (ISOVUE-370) 76 % injection (100 mLs  Contrast Given 07/01/16 1358)  morphine 4 MG/ML injection 4 mg (4 mg Intravenous Given 07/01/16 1700)     Initial Impression / Assessment and Plan / ED Course  I have reviewed the triage vital signs and the nursing notes.  Pertinent labs & imaging results that were available during my  care of the patient were reviewed by me and considered in my medical decision making (see chart for details).  Clinical Course    82 y.o. female presents with Sudden onset epigastric abdominal pain severe nature. EMS activated code STEMI prior to arrival as patient was diaphoretic and having what they believed to be lower chest pain or upper abdominal pain. On arrival her EKG is consistent with benign early repolarization and has been present on prior EKGs. Her symptoms are reproducible by epigastric palpation. She's been vomiting and this appears to be more of a gastroenterologic phenomenon currently. Due to sudden onset of symptoms and pain out of proportion to exam findings CT angio of the abdomen was ordered to rule out acute mesenteric ischemia and evaluate for other causes of pain. Small pericardial effusion was noted on bedside echo and with pain location and severity of symptoms CT angiography of the chest was added to rule out dissection. Features are atypical for ACS with negative troponin and unchanged EKG. Abdominal aorta appears unremarkable on initial evaluation.  It  appears patient had similar episode one year ago and was found to have symptomatic gallstones versus gastritis. She admits to eating at a fish fry yesterday and has not eaten fried foods for a long time in an attempt to control what she thought was reflux. She never obtained follow-up for her cholelithiasis with surgery as instructed.  CT demonstrates no acute findings except for cholelithiasis and no signs of cholecystitis. Formal ultrasound was ordered for more detailed evaluation. Given the onset of her pain and historical features I feel that symptomatic cholelithiasis is more likely than gastritis in the setting. I discussed pain control with the patient here.  Plan for consultation with surgery if pain is unable to be controlled.   Final Clinical Impressions(s) / ED Diagnoses   Final diagnoses:  Abdominal pain  Symptomatic cholelithiasis    New Prescriptions Current Discharge Medication List       Leo Grosser, MD 07/01/16 1801

## 2016-07-01 NOTE — Consult Note (Signed)
Reason for Consult: Symptomatic gallstones Referring Physician: Dr. Ursula Alert  [name] is an 82 y.o. female.  HPI: This is a pleasant 82 year old female who presents with a one-day history of epigastric abdominal pain, nausea, and vomiting. She initially presented diaphoretic and a workup in the emergency department was first performed to rule out an MI. She has had atypical chest pain in the past. Most recent echo was normal. She ended up having an ultrasound of her gallbladder showing a large gallstone in the gallbladder neck. She has multiple mild medical problems and has had a previous pulmonary lobectomy. She reports minimal shortness of breath with long ambulation. She may have had some milder similar attacks of discomfort over the past several months.  Past Medical History:  Diagnosis Date  . Bronchiectasis    isolated to RML; status post right middle lobe partial resection.  . H/O osteoporosis   . Hypertension   . Hypothyroidism   . OSA (obstructive sleep apnea)   . Yeast infection     Past Surgical History:  Procedure Laterality Date  . ABDOMINAL HYSTERECTOMY  1974  . HERNIA REPAIR  1975  . LUNG REMOVAL, PARTIAL  208   RML  . NM MYOVIEW LTD  September 2012   Normal EF. No ischemia or infarction.  [facility] Dimes ECHOCARDIOGRAM  September 2012   Mild concentric LVH. Normal EF with impaired relaxation. Mildly elevated RV pressures of 30 and 40 mmHg.    Family History  Problem Relation Age of Onset  . Hypertension Mother   . CVA Mother 35  . Heart attack Sister   . Diabetes Sister   . Heart attack Brother   . Heart attack Brother     Social History:  reports that she has never smoked. She has never used smokeless tobacco. She reports that she does not drink alcohol or use drugs.  Allergies:  Allergies  Allergen Reactions  . Ibuprofen Other (See Comments)    hallucinations  . Naproxen Sodium Other (See Comments)    hallucinations    Medications: I  have reviewed the patient's current medications.  Results for orders placed or performed during the hospital encounter of 07/01/16 (from the past 48 hour(s))  Protime-INR     Status: None   Collection Time: 07/01/16 12:10 PM  Result Value Ref Range   Prothrombin Time 13.1 11.4 - 15.2 seconds   INR 0.99   APTT     Status: None   Collection Time: 07/01/16 12:10 PM  Result Value Ref Range   aPTT 27 24 - 36 seconds  Comprehensive metabolic panel     Status: Abnormal   Collection Time: 07/01/16 12:10 PM  Result Value Ref Range   Sodium 136 135 - 145 mmol/L   Potassium 3.2 (L) 3.5 - 5.1 mmol/L   Chloride 103 101 - 111 mmol/L   CO2 26 22 - 32 mmol/L   Glucose, Bld 115 (H) 65 - 99 mg/dL   BUN 19 6 - 20 mg/dL   Creatinine, Ser 0.75 0.44 - 1.00 mg/dL   Calcium 8.8 (L) 8.9 - 10.3 mg/dL   Total Protein 6.7 6.5 - 8.1 g/dL   Albumin 3.4 (L) 3.5 - 5.0 g/dL   AST 18 15 - 41 U/L   ALT 14 14 - 54 U/L   Alkaline Phosphatase 41 38 - 126 U/L   Total Bilirubin 0.5 0.3 - 1.2 mg/dL   GFR calc non Af Amer >60 >60 mL/min   GFR calc Af  Amer >60 >60 mL/min    Comment: (NOTE) The eGFR has been calculated using the CKD EPI equation. This calculation has not been validated in all clinical situations. eGFR's persistently <60 mL/min signify possible Chronic Kidney Disease.    Anion gap 7 5 - 15  Troponin I     Status: None   Collection Time: 07/01/16 12:10 PM  Result Value Ref Range   Troponin I <0.03 <0.03 ng/mL  CBC with Differential     Status: Abnormal   Collection Time: 07/01/16 12:10 PM  Result Value Ref Range   WBC 8.1 4.0 - 10.5 K/uL   RBC 4.23 3.87 - 5.11 MIL/uL   Hemoglobin 12.1 12.0 - 15.0 g/dL   HCT 34.8 (L) 36.0 - 46.0 %   MCV 82.3 78.0 - 100.0 fL   MCH 28.6 26.0 - 34.0 pg   MCHC 34.8 30.0 - 36.0 g/dL   RDW 14.6 11.5 - 15.5 %   Platelets 353 150 - 400 K/uL   Neutrophils Relative % 58 %   Neutro Abs 4.6 1.7 - 7.7 K/uL   Lymphocytes Relative 31 %   Lymphs Abs 2.5 0.7 - 4.0 K/uL    Monocytes Relative 10 %   Monocytes Absolute 0.8 0.1 - 1.0 K/uL   Eosinophils Relative 1 %   Eosinophils Absolute 0.1 0.0 - 0.7 K/uL   Basophils Relative 0 %   Basophils Absolute 0.0 0.0 - 0.1 K/uL  Lipase, blood     Status: None   Collection Time: 07/01/16 12:10 PM  Result Value Ref Range   Lipase 38 11 - 51 U/L  POCT i-Stat troponin I     Status: None   Collection Time: 07/01/16 12:12 PM  Result Value Ref Range   Troponin i, poc 0.00 0.00 - 0.08 ng/mL   Comment 3            Comment: Due to the release kinetics of cTnI, a negative result within the first hours of the onset of symptoms does not rule out myocardial infarction with certainty. If myocardial infarction is still suspected, repeat the test at appropriate intervals.     Dg Chest Port 1 View  Result Date: 07/01/2016 CLINICAL DATA:  Posttraumatic pain after trauma. 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 EXAM: PORTABLE CHEST 1 VIEW COMPARISON:  12/30/2008 chest CT, 06/28/2010 chest radiograph FINDINGS: Lung volumes are low with bibasilar atelectasis and crowding of interstitial lung markings. No pneumonic consolidations. There is mild vascular congestion. Heart is normal in size. No aortic aneurysm. No acute osseous abnormality. Surgical clips are seen about the right infrahilar portion of the chest. IMPRESSION: Low lung volumes with mild pulmonary vascular congestion. Stable postsurgical changes about the right hilum. Electronically Signed   By: Ashley Royalty M.D.   On: 07/01/2016 12:57   Ct Angio Chest/abd/pel For Dissection W And/or W/wo  Result Date: 07/01/2016 CLINICAL DATA:  Pt having severe mid abd epigastric pain that started this AM. EXAM: CT ANGIOGRAPHY CHEST, ABDOMEN AND PELVIS TECHNIQUE: Multidetector CT imaging through the chest, abdomen and pelvis was performed using the standard protocol during bolus administration of intravenous contrast. Multiplanar reconstructed images  and MIPs were obtained and reviewed to evaluate the vascular anatomy. CONTRAST:  100 ml iso 370. COMPARISON:  None. FINDINGS: CTA CHEST FINDINGS Cardiovascular: Left arm IV contrast administration. Innominate vein and SVC patent. Dilated central pulmonary arteries. Satisfactory opacification of pulmonary arteries noted, and there is no evidence of pulmonary emboli. Patent  bilateral pulmonary veins drain into left atrium. Adequate contrast opacification of the thoracic aorta with no evidence of dissection, aneurysm, or stenosis. There is bovine variant brachiocephalic arch anatomy without proximal stenosis. Tortuous right brachiocephalic and proximal left subclavian arteries. Minimal scattered calcified plaque in the aortic arch and descending thoracic segment. Mediastinum/Nodes: Enlarged heterogeneous thyroid with left lesion at least 2.6 cm. No hilar or mediastinal adenopathy. Calcified bilateral hilar lymph nodes. No pericardial effusion. Lungs/Pleura: Dependent atelectasis posteriorly in both lungs. Postop changes at the right hilum. No confluent airspace disease. Calcified granuloma in the superior segment left lower lobe. Musculoskeletal: Mild spondylitic changes throughout the thoracic spine. Negative for fracture. Review of the MIP images confirms the above findings. CTA ABDOMEN AND PELVIS FINDINGS VASCULAR Aorta: Scattered calcified plaque. No aneurysm, dissection, or stenosis. Celiac: Patent without evidence of aneurysm, dissection, vasculitis or significant stenosis. SMA: Patent without evidence of aneurysm, dissection, vasculitis or significant stenosis. Renals: Both renal arteries are patent without evidence of aneurysm, dissection, vasculitis, fibromuscular dysplasia or significant stenosis. IMA: Patent without evidence of aneurysm, dissection, vasculitis or significant stenosis. Inflow: Scattered calcified plaque through bilateral common iliac arteries and internal iliac arteries. No aneurysm,  dissection, or stenosis. Veins: No obvious venous abnormality within the limitations of this arterial phase study. Review of the MIP images confirms the above findings. NON-VASCULAR Hepatobiliary: 16 mm enhancing subcapsular lesion in the left lobe image 117/501, stable since scans from 08/19/2006, presumably benign lesion such as FNH. No new liver lesion. Partially calcified stones in the dependent aspect of the nondilated gallbladder. No intra or extrahepatic biliary ductal dilatation. Pancreas: Unremarkable Spleen: Unremarkable arterial phase Adrenals/Urinary Tract: Normal adrenals. No hydronephrosis. No renal mass. Urinary bladder nondistended. Stomach/Bowel: Stomach and small bowel nondilated. Normal appendix. Colonic diverticula most numerous in the sigmoid segment, without significant adjacent inflammatory/ edematous change. Lymphatic: No adenopathy localized. Musculoskeletal: Mild lumbar dextroscoliosis with multilevel degenerative changes. Bilateral hip osteoarthritis. Negative for fracture. Other: No ascites.  No free air. Review of the MIP images confirms the above findings. IMPRESSION: 1. Negative for acute PE or aortic dissection. 2. Dilated central pulmonary arteries suggesting pulmonary hypertension. 3. Cholelithiasis 4. Sigmoid diverticulosis 5. Thyromegaly with nodules Electronically Signed   By: Lucrezia Europe M.D.   On: 07/01/2016 14:48   US Abdomen Limited Ruq  Result Date: 07/01/2016 CLINICAL DATA:  Upper abdominal pain EXAM: US ABDOMEN LIMITED - RIGHT UPPER QUADRANT COMPARISON:  CT abdomen and pelvis Feb 28, 2015 ; CT angiogram chest including portions of the liver July 01, 2016 FINDINGS: Gallbladder: There is a 1.5 cm calculus adherent in the neck of the gallbladder. There is no gallbladder wall thickening or pericholecystic fluid. There is slight sludge in the gallbladder. No sonographic Murphy sign noted by sonographer. Common bile duct: Diameter: 5 mm. There is no intrahepatic or  extrahepatic biliary duct dilatation. Liver: No focal lesion identified. Liver echogenicity is somewhat increased. IMPRESSION: There is a 1.5 cm in length calculus adherent in the neck of the gallbladder. Mild sludge is noted in the gallbladder. No gallbladder wall thickening or pericholecystic fluid. Increased liver echogenicity is present, a finding most likely indicative of a degree of hepatic steatosis. No focal liver lesions are evident by ultrasound. Note that no lesion is identified near the junction of the right and left lobes as was noted on CT examination performed earlier in the day. It must be cautioned that the sensitivity of ultrasound for focal liver lesions is diminished in this circumstance. Electronically Signed   By:  Lowella Grip III M.D.   On: 07/01/2016 15:36    Review of Systems  All other systems reviewed and are negative.  Blood pressure 136/61, pulse 61, temperature 98.2 F (36.8 C), temperature source Oral, resp. rate 17, height _0  (1.549 m), weight 94.3 kg (208 lb), SpO2 97 %. Physical Exam  Constitutional: She is oriented to person, place, and time. She appears well-developed and well-nourished.  Lying on her side, slightly uncomfortable in appearance  HENT:  Head: Normocephalic and atraumatic.  Right Ear: External ear normal.  Left Ear: External ear normal.  Nose: Nose normal.  Mouth/Throat: No oropharyngeal exudate.  Eyes: Pupils are equal, round, and reactive to light. Right eye exhibits no discharge. Left eye exhibits no discharge. No scleral icterus.  Neck: Normal range of motion. Neck supple. No tracheal deviation present.  Cardiovascular: Normal rate, regular rhythm and normal heart sounds.   No murmur heard. Respiratory: Effort normal and breath sounds normal. No respiratory distress. She has no wheezes. She has no rales.  GI: Soft. There is tenderness. There is guarding.  There is tenderness with guarding in the epigastrium  Musculoskeletal: Normal  range of motion. She exhibits no edema or tenderness.  Neurological: She is alert and oriented to person, place, and time.  Skin: Skin is warm. No rash noted. No erythema.  Psychiatric: Her behavior is normal. Judgment normal.    Assessment/Plan: Symptomatic cholelithiasis with a gallstone impacted in the gallbladder neck  I discussed the diagnosis with the patient and her family. They are reluctant for her to have surgery given her age but I explained to her that without significant pulmonary or cardiac problems that she should tolerate general anesthesia for cholecystectomy. I discussed the surgical procedure with them. She is having a repeat EKG in the morning. If there are no issues, then hopefully she may proceed to the operating room tomorrow for a laparoscopic cholecystectomy  Briante Loveall A 07/01/2016, 6:38 PM

## 2016-07-02 ENCOUNTER — Encounter (HOSPITAL_COMMUNITY): Payer: Self-pay | Admitting: Certified Registered Nurse Anesthetist

## 2016-07-02 ENCOUNTER — Observation Stay (HOSPITAL_COMMUNITY): Payer: Medicare Other | Admitting: Certified Registered Nurse Anesthetist

## 2016-07-02 ENCOUNTER — Encounter (HOSPITAL_COMMUNITY)
Admission: EM | Disposition: A | Discharge status: Home / Self Care | Payer: Self-pay | Source: Home / Self Care | Attending: Internal Medicine

## 2016-07-02 DIAGNOSIS — K8020 Calculus of gallbladder without cholecystitis without obstruction: Secondary | ICD-10-CM | POA: Diagnosis not present

## 2016-07-02 DIAGNOSIS — B353 Tinea pedis: Secondary | ICD-10-CM | POA: Diagnosis not present

## 2016-07-02 DIAGNOSIS — Z7982 Long term (current) use of aspirin: Secondary | ICD-10-CM | POA: Diagnosis not present

## 2016-07-02 DIAGNOSIS — R9431 Abnormal electrocardiogram [ECG] [EKG]: Secondary | ICD-10-CM | POA: Diagnosis not present

## 2016-07-02 DIAGNOSIS — G4733 Obstructive sleep apnea (adult) (pediatric): Secondary | ICD-10-CM | POA: Diagnosis not present

## 2016-07-02 DIAGNOSIS — K8000 Calculus of gallbladder with acute cholecystitis without obstruction: Secondary | ICD-10-CM | POA: Diagnosis not present

## 2016-07-02 DIAGNOSIS — Z6839 Body mass index (BMI) 39.0-39.9, adult: Secondary | ICD-10-CM | POA: Diagnosis not present

## 2016-07-02 DIAGNOSIS — Z79899 Other long term (current) drug therapy: Secondary | ICD-10-CM | POA: Diagnosis not present

## 2016-07-02 DIAGNOSIS — I10 Essential (primary) hypertension: Secondary | ICD-10-CM | POA: Diagnosis not present

## 2016-07-02 DIAGNOSIS — M810 Age-related osteoporosis without current pathological fracture: Secondary | ICD-10-CM | POA: Diagnosis not present

## 2016-07-02 DIAGNOSIS — E0590 Thyrotoxicosis, unspecified without thyrotoxic crisis or storm: Secondary | ICD-10-CM | POA: Diagnosis not present

## 2016-07-02 HISTORY — PX: CHOLECYSTECTOMY: SHX55

## 2016-07-02 LAB — COMPREHENSIVE METABOLIC PANEL
ALT: 15 U/L (ref 14–54)
AST: 20 U/L (ref 15–41)
Albumin: 3.3 g/dL — ABNORMAL LOW (ref 3.5–5.0)
Alkaline Phosphatase: 40 U/L (ref 38–126)
Anion gap: 6 (ref 5–15)
BUN: 9 mg/dL (ref 6–20)
CO2: 27 mmol/L (ref 22–32)
Calcium: 8.5 mg/dL — ABNORMAL LOW (ref 8.9–10.3)
Chloride: 102 mmol/L (ref 101–111)
Creatinine, Ser: 0.66 mg/dL (ref 0.44–1.00)
GFR calc Af Amer: >60 mL/min (ref >60)
GFR calc non Af Amer: >60 mL/min (ref >60)
Glucose, Bld: 105 mg/dL — ABNORMAL HIGH (ref 65–99)
Potassium: 3.8 mmol/L (ref 3.5–5.1)
Sodium: 135 mmol/L (ref 135–145)
Total Bilirubin: 0.8 mg/dL (ref 0.3–1.2)
Total Protein: 6.9 g/dL (ref 6.5–8.1)

## 2016-07-02 LAB — CBC
HCT: 34.7 % — ABNORMAL LOW (ref 36.0–46.0)
Hemoglobin: 12.1 g/dL (ref 12.0–15.0)
MCH: 28.5 pg (ref 26.0–34.0)
MCHC: 34.9 g/dL (ref 30.0–36.0)
MCV: 81.8 fL (ref 78.0–100.0)
Platelets: 319 10*3/uL (ref 150–400)
RBC: 4.24 MIL/uL (ref 3.87–5.11)
RDW: 14.9 % (ref 11.5–15.5)
WBC: 11.7 10*3/uL — ABNORMAL HIGH (ref 4.0–10.5)

## 2016-07-02 LAB — TROPONIN I
Troponin I: <0.03 ng/mL (ref <0.03)
Troponin I: <0.03 ng/mL (ref <0.03)

## 2016-07-02 LAB — SURGICAL PCR SCREEN
MRSA, PCR: NEGATIVE
Staphylococcus aureus: NEGATIVE

## 2016-07-02 SURGERY — LAPAROSCOPIC CHOLECYSTECTOMY WITH INTRAOPERATIVE CHOLANGIOGRAM
Anesthesia: General | Site: Abdomen

## 2016-07-02 MED ORDER — LIDOCAINE HCL 1 % IJ SOLN
INTRAMUSCULAR | Status: DC | PRN
  Administered 2016-07-02: 13 mL

## 2016-07-02 MED ORDER — ROCURONIUM BROMIDE 10 MG/ML (PF) SYRINGE
PREFILLED_SYRINGE | INTRAVENOUS | Status: AC
  Filled 2016-07-02: qty 10

## 2016-07-02 MED ORDER — SUCCINYLCHOLINE 20MG/ML (10ML) SYRINGE FOR MEDFUSION PUMP - OPTIME
INTRAMUSCULAR | Status: DC | PRN
  Administered 2016-07-02: 100 mg via INTRAVENOUS

## 2016-07-02 MED ORDER — FENTANYL CITRATE (PF) 100 MCG/2ML IJ SOLN
INTRAMUSCULAR | Status: AC
  Filled 2016-07-02: qty 2

## 2016-07-02 MED ORDER — LIDOCAINE 2% (20 MG/ML) 5 ML SYRINGE
INTRAMUSCULAR | Status: AC
  Filled 2016-07-02: qty 5

## 2016-07-02 MED ORDER — ROCURONIUM BROMIDE 100 MG/10ML IV SOLN
INTRAVENOUS | Status: DC | PRN
  Administered 2016-07-02: 5 mg via INTRAVENOUS
  Administered 2016-07-02: 10 mg via INTRAVENOUS
  Administered 2016-07-02: 5 mg via INTRAVENOUS
  Administered 2016-07-02: 30 mg via INTRAVENOUS

## 2016-07-02 MED ORDER — FENTANYL CITRATE (PF) 100 MCG/2ML IJ SOLN
INTRAMUSCULAR | Status: DC | PRN
  Administered 2016-07-02: 50 ug via INTRAVENOUS
  Administered 2016-07-02: 100 ug via INTRAVENOUS
  Administered 2016-07-02: 50 ug via INTRAVENOUS

## 2016-07-02 MED ORDER — SUGAMMADEX SODIUM 200 MG/2ML IV SOLN
INTRAVENOUS | Status: AC
  Filled 2016-07-02: qty 2

## 2016-07-02 MED ORDER — FENTANYL CITRATE (PF) 100 MCG/2ML IJ SOLN
25.0000 ug | INTRAMUSCULAR | Status: DC | PRN
  Administered 2016-07-02: 50 ug via INTRAVENOUS

## 2016-07-02 MED ORDER — IOPAMIDOL (ISOVUE-300) INJECTION 61%
INTRAVENOUS | Status: AC
  Filled 2016-07-02: qty 50

## 2016-07-02 MED ORDER — DEXTROSE 5 % IV SOLN
2.0000 g | Freq: Once | INTRAVENOUS | Status: AC
  Administered 2016-07-02: 2 g via INTRAVENOUS
  Filled 2016-07-02: qty 2

## 2016-07-02 MED ORDER — SODIUM CHLORIDE 0.9 % IR SOLN
Status: DC | PRN
  Administered 2016-07-02: 1

## 2016-07-02 MED ORDER — HEMOSTATIC AGENTS (NO CHARGE) OPTIME
TOPICAL | Status: DC | PRN
  Administered 2016-07-02: 1 via TOPICAL

## 2016-07-02 MED ORDER — PHENYLEPHRINE HCL 10 MG/ML IJ SOLN
INTRAMUSCULAR | Status: DC | PRN
  Administered 2016-07-02 (×4): 80 ug via INTRAVENOUS

## 2016-07-02 MED ORDER — OXYCODONE HCL 5 MG PO TABS
5.0000 mg | ORAL_TABLET | ORAL | Status: DC | PRN
  Administered 2016-07-02 – 2016-07-03 (×2): 10 mg via ORAL
  Filled 2016-07-02 (×2): qty 2

## 2016-07-02 MED ORDER — PROPOFOL 10 MG/ML IV BOLUS
INTRAVENOUS | Status: AC
  Filled 2016-07-02: qty 20

## 2016-07-02 MED ORDER — ONDANSETRON HCL 4 MG/2ML IJ SOLN
INTRAMUSCULAR | Status: AC
  Filled 2016-07-02: qty 2

## 2016-07-02 MED ORDER — FENTANYL CITRATE (PF) 100 MCG/2ML IJ SOLN
INTRAMUSCULAR | Status: AC
Start: 2016-07-02 — End: 2016-07-02
  Filled 2016-07-02: qty 4

## 2016-07-02 MED ORDER — HYDROMORPHONE HCL 1 MG/ML IJ SOLN
0.5000 mg | INTRAMUSCULAR | Status: DC | PRN
  Administered 2016-07-02 (×2): 1 mg via INTRAVENOUS
  Filled 2016-07-02 (×2): qty 1

## 2016-07-02 MED ORDER — DEXTROSE 5 % IV SOLN
INTRAVENOUS | Status: DC | PRN
  Administered 2016-07-02: 2 g via INTRAVENOUS

## 2016-07-02 MED ORDER — SUGAMMADEX SODIUM 200 MG/2ML IV SOLN
INTRAVENOUS | Status: DC | PRN
  Administered 2016-07-02: 200 mg via INTRAVENOUS

## 2016-07-02 MED ORDER — LIDOCAINE HCL (CARDIAC) 20 MG/ML IV SOLN
INTRAVENOUS | Status: DC | PRN
  Administered 2016-07-02: 100 mg via INTRAVENOUS

## 2016-07-02 MED ORDER — PROPOFOL 10 MG/ML IV BOLUS
INTRAVENOUS | Status: DC | PRN
  Administered 2016-07-02: 150 mg via INTRAVENOUS

## 2016-07-02 MED ORDER — 0.9 % SODIUM CHLORIDE (POUR BTL) OPTIME
TOPICAL | Status: DC | PRN
  Administered 2016-07-02: 1000 mL

## 2016-07-02 MED ORDER — ONDANSETRON HCL 4 MG/2ML IJ SOLN
INTRAMUSCULAR | Status: DC | PRN
  Administered 2016-07-02: 4 mg via INTRAVENOUS

## 2016-07-02 MED ORDER — LACTATED RINGERS IV SOLN
INTRAVENOUS | Status: DC | PRN
  Administered 2016-07-02 (×2): via INTRAVENOUS

## 2016-07-02 MED ORDER — LIDOCAINE HCL (PF) 1 % IJ SOLN
INTRAMUSCULAR | Status: AC
  Filled 2016-07-02: qty 30

## 2016-07-02 MED ORDER — BUPIVACAINE-EPINEPHRINE (PF) 0.25% -1:200000 IJ SOLN
INTRAMUSCULAR | Status: AC
  Filled 2016-07-02: qty 30

## 2016-07-02 SURGICAL SUPPLY — 47 items
ADH SKN CLS APL DERMABOND .7 (GAUZE/BANDAGES/DRESSINGS) ×1
APPLIER CLIP ROT 10 11.4 M/L (STAPLE) ×2
APR CLP MED LRG 11.4X10 (STAPLE) ×1
BAG SPEC RTRVL LRG 6X4 10 (ENDOMECHANICALS) ×1
BLADE SURG ROTATE 9660 (MISCELLANEOUS) IMPLANT
CANISTER SUCTION 2500CC (MISCELLANEOUS) ×2 IMPLANT
CHLORAPREP W/TINT 26ML (MISCELLANEOUS) ×2 IMPLANT
CLIP APPLIE ROT 10 11.4 M/L (STAPLE) ×1 IMPLANT
COVER MAYO STAND STRL (DRAPES) ×2 IMPLANT
COVER SURGICAL LIGHT HANDLE (MISCELLANEOUS) ×2 IMPLANT
DERMABOND ADVANCED (GAUZE/BANDAGES/DRESSINGS) ×1
DERMABOND ADVANCED .7 DNX12 (GAUZE/BANDAGES/DRESSINGS) ×1 IMPLANT
DRAPE C-ARM 42X72 X-RAY (DRAPES) ×2 IMPLANT
DRAPE WARM FLUID 44X44 (DRAPE) ×2 IMPLANT
ELECT REM PT RETURN 9FT ADLT (ELECTROSURGICAL) ×2
ELECTRODE REM PT RTRN 9FT ADLT (ELECTROSURGICAL) ×1 IMPLANT
FILTER SMOKE EVAC LAPAROSHD (FILTER) IMPLANT
GLOVE BIO SURGEON STRL SZ 6 (GLOVE) ×2 IMPLANT
GLOVE BIOGEL PI IND STRL 6.5 (GLOVE) ×1 IMPLANT
GLOVE BIOGEL PI IND STRL 7.0 (GLOVE) IMPLANT
GLOVE BIOGEL PI INDICATOR 6.5 (GLOVE) ×1
GLOVE BIOGEL PI INDICATOR 7.0 (GLOVE) ×2
GLOVE SURG SS PI 7.0 STRL IVOR (GLOVE) ×2 IMPLANT
GOWN STRL REUS W/ TWL LRG LVL3 (GOWN DISPOSABLE) ×2 IMPLANT
GOWN STRL REUS W/TWL 2XL LVL3 (GOWN DISPOSABLE) ×2 IMPLANT
GOWN STRL REUS W/TWL LRG LVL3 (GOWN DISPOSABLE) ×4
KIT BASIN OR (CUSTOM PROCEDURE TRAY) ×2 IMPLANT
KIT ROOM TURNOVER OR (KITS) ×2 IMPLANT
L-HOOK LAP DISP 36CM (ELECTROSURGICAL) ×2
LHOOK LAP DISP 36CM (ELECTROSURGICAL) ×1 IMPLANT
NS IRRIG 1000ML POUR BTL (IV SOLUTION) ×2 IMPLANT
PAD ARMBOARD 7.5X6 YLW CONV (MISCELLANEOUS) ×2 IMPLANT
PENCIL BUTTON HOLSTER BLD 10FT (ELECTRODE) ×2 IMPLANT
POUCH SPECIMEN RETRIEVAL 10MM (ENDOMECHANICALS) ×2 IMPLANT
SCISSORS LAP 5X35 DISP (ENDOMECHANICALS) ×2 IMPLANT
SET CHOLANGIOGRAPH 5 50 .035 (SET/KITS/TRAYS/PACK) ×2 IMPLANT
SET IRRIG TUBING LAPAROSCOPIC (IRRIGATION / IRRIGATOR) ×2 IMPLANT
SLEEVE ENDOPATH XCEL 5M (ENDOMECHANICALS) ×3 IMPLANT
SPECIMEN JAR SMALL (MISCELLANEOUS) ×2 IMPLANT
SUT MNCRL AB 4-0 PS2 18 (SUTURE) ×2 IMPLANT
TOWEL OR 17X24 6PK STRL BLUE (TOWEL DISPOSABLE) ×2 IMPLANT
TOWEL OR 17X26 10 PK STRL BLUE (TOWEL DISPOSABLE) ×2 IMPLANT
TRAY LAPAROSCOPIC MC (CUSTOM PROCEDURE TRAY) ×2 IMPLANT
TROCAR XCEL BLUNT TIP 100MML (ENDOMECHANICALS) ×2 IMPLANT
TROCAR XCEL NON-BLD 11X100MML (ENDOMECHANICALS) ×2 IMPLANT
TROCAR XCEL NON-BLD 5MMX100MML (ENDOMECHANICALS) ×2 IMPLANT
TUBING INSUFFLATION (TUBING) ×2 IMPLANT

## 2016-07-02 NOTE — Care Management Obs Status (Signed)
[facility] NOTIFICATION   Patient Details  Name: [name] MRN: [id number] Date of Birth: 14-Feb-1933   Medicare Observation Status Notification Given:  Yes    Marilu Favre, RN 07/02/2016, 10:33 AM

## 2016-07-02 NOTE — Progress Notes (Signed)
1 Day Post-Op  Subjective: Pt enroute to OR.  Objective: Vital signs in last 24 hours: Temp:  [97.8 F (36.6 C)-99.4 F (37.4 C)] 99.4 F (37.4 C) (09/25 0629) Pulse Rate:  [54-65] 65 (09/25 0629) Resp:  [14-26] 16 (09/25 0629) BP: (90-143)/(58-85) 143/67 (09/25 0629) SpO2:  [87 %-97 %] 94 % (09/25 0629) Weight:  [93.9 kg (207 lb)-94.3 kg (208 lb)] 93.9 kg (207 lb) (09/24 1802) Last BM Date: 07/01/16 Afebrile, VSS Labs OK Intake/Output from previous day: 09/24 0701 - 09/25 0700 In: 350 [I.V.:300; IV Piggyback:50] Out: -  Intake/Output this shift: No intake/output data recorded.  Pt off floor not seen  Lab Results:   Recent Labs  07/01/16 1210 07/02/16 0602  WBC 8.1 11.7*  HGB 12.1 12.1  HCT 34.8* 34.7*  PLT 353 319    BMET  Recent Labs  07/01/16 1210 07/02/16 0602  NA 136 135  K 3.2* 3.8  CL 103 102  CO2 26 27  GLUCOSE 115* 105*  BUN 19 9  CREATININE 0.75 0.66  CALCIUM 8.8* 8.5*   PT/INR  Recent Labs  07/01/16 1210  LABPROT 13.1  INR 0.99     Recent Labs Lab 07/01/16 1210 07/02/16 0602  AST 18 20  ALT 14 15  ALKPHOS 41 40  BILITOT 0.5 0.8  PROT 6.7 6.9  ALBUMIN 3.4* 3.3*     Lipase     Component Value Date/Time   LIPASE 38 07/01/2016 1210     Studies/Results: Dg Chest Port 1 View  Result Date: 07/01/2016 CLINICAL DATA:  Posttraumatic pain after trauma. 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 EXAM: PORTABLE CHEST 1 VIEW COMPARISON:  12/30/2008 chest CT, 06/28/2010 chest radiograph FINDINGS: Lung volumes are low with bibasilar atelectasis and crowding of interstitial lung markings. No pneumonic consolidations. There is mild vascular congestion. Heart is normal in size. No aortic aneurysm. No acute osseous abnormality. Surgical clips are seen about the right infrahilar portion of the chest. IMPRESSION: Low lung volumes with mild pulmonary vascular congestion. Stable postsurgical changes about  the right hilum. Electronically Signed   By: Ashley Royalty M.D.   On: 07/01/2016 12:57   Ct Angio Chest/abd/pel For Dissection W And/or W/wo  Result Date: 07/01/2016 CLINICAL DATA:  Pt having severe mid abd epigastric pain that started this AM. EXAM: CT ANGIOGRAPHY CHEST, ABDOMEN AND PELVIS TECHNIQUE: Multidetector CT imaging through the chest, abdomen and pelvis was performed using the standard protocol during bolus administration of intravenous contrast. Multiplanar reconstructed images and MIPs were obtained and reviewed to evaluate the vascular anatomy. CONTRAST:  100 ml iso 370. COMPARISON:  None. FINDINGS: CTA CHEST FINDINGS Cardiovascular: Left arm IV contrast administration. Innominate vein and SVC patent. Dilated central pulmonary arteries. Satisfactory opacification of pulmonary arteries noted, and there is no evidence of pulmonary emboli. Patent bilateral pulmonary veins drain into left atrium. Adequate contrast opacification of the thoracic aorta with no evidence of dissection, aneurysm, or stenosis. There is bovine variant brachiocephalic arch anatomy without proximal stenosis. Tortuous right brachiocephalic and proximal left subclavian arteries. Minimal scattered calcified plaque in the aortic arch and descending thoracic segment. Mediastinum/Nodes: Enlarged heterogeneous thyroid with left lesion at least 2.6 cm. No hilar or mediastinal adenopathy. Calcified bilateral hilar lymph nodes. No pericardial effusion. Lungs/Pleura: Dependent atelectasis posteriorly in both lungs. Postop changes at the right hilum. No confluent airspace disease. Calcified granuloma in the superior segment left lower lobe. Musculoskeletal: Mild spondylitic changes throughout the thoracic spine. Negative  for fracture. Review of the MIP images confirms the above findings. CTA ABDOMEN AND PELVIS FINDINGS VASCULAR Aorta: Scattered calcified plaque. No aneurysm, dissection, or stenosis. Celiac: Patent without evidence of aneurysm,  dissection, vasculitis or significant stenosis. SMA: Patent without evidence of aneurysm, dissection, vasculitis or significant stenosis. Renals: Both renal arteries are patent without evidence of aneurysm, dissection, vasculitis, fibromuscular dysplasia or significant stenosis. IMA: Patent without evidence of aneurysm, dissection, vasculitis or significant stenosis. Inflow: Scattered calcified plaque through bilateral common iliac arteries and internal iliac arteries. No aneurysm, dissection, or stenosis. Veins: No obvious venous abnormality within the limitations of this arterial phase study. Review of the MIP images confirms the above findings. NON-VASCULAR Hepatobiliary: 16 mm enhancing subcapsular lesion in the left lobe image 117/501, stable since scans from 08/19/2006, presumably benign lesion such as FNH. No new liver lesion. Partially calcified stones in the dependent aspect of the nondilated gallbladder. No intra or extrahepatic biliary ductal dilatation. Pancreas: Unremarkable Spleen: Unremarkable arterial phase Adrenals/Urinary Tract: Normal adrenals. No hydronephrosis. No renal mass. Urinary bladder nondistended. Stomach/Bowel: Stomach and small bowel nondilated. Normal appendix. Colonic diverticula most numerous in the sigmoid segment, without significant adjacent inflammatory/ edematous change. Lymphatic: No adenopathy localized. Musculoskeletal: Mild lumbar dextroscoliosis with multilevel degenerative changes. Bilateral hip osteoarthritis. Negative for fracture. Other: No ascites.  No free air. Review of the MIP images confirms the above findings. IMPRESSION: 1. Negative for acute PE or aortic dissection. 2. Dilated central pulmonary arteries suggesting pulmonary hypertension. 3. Cholelithiasis 4. Sigmoid diverticulosis 5. Thyromegaly with nodules Electronically Signed   By: Lucrezia Europe M.D.   On: 07/01/2016 14:48   US Abdomen Limited Ruq  Result Date: 07/01/2016 CLINICAL DATA:  Upper abdominal  pain EXAM: US ABDOMEN LIMITED - RIGHT UPPER QUADRANT COMPARISON:  CT abdomen and pelvis Feb 28, 2015 ; CT angiogram chest including portions of the liver July 01, 2016 FINDINGS: Gallbladder: There is a 1.5 cm calculus adherent in the neck of the gallbladder. There is no gallbladder wall thickening or pericholecystic fluid. There is slight sludge in the gallbladder. No sonographic Murphy sign noted by sonographer. Common bile duct: Diameter: 5 mm. There is no intrahepatic or extrahepatic biliary duct dilatation. Liver: No focal lesion identified. Liver echogenicity is somewhat increased. IMPRESSION: There is a 1.5 cm in length calculus adherent in the neck of the gallbladder. Mild sludge is noted in the gallbladder. No gallbladder wall thickening or pericholecystic fluid. Increased liver echogenicity is present, a finding most likely indicative of a degree of hepatic steatosis. No focal liver lesions are evident by ultrasound. Note that no lesion is identified near the junction of the right and left lobes as was noted on CT examination performed earlier in the day. It must be cautioned that the sensitivity of ultrasound for focal liver lesions is diminished in this circumstance. Electronically Signed   By: Lowella Grip III M.D.   On: 07/01/2016 15:36   Prior to Admission medications   Medication Sig Start Date End Date Taking? Authorizing Provider  acetaminophen (TYLENOL) 650 MG CR tablet Take 1,300 mg by mouth every 8 (eight) hours as needed for pain.    Yes Historical Provider, MD  aspirin EC 81 MG tablet Take 81 mg by mouth at bedtime.   Yes Historical Provider, MD  CALCIUM-MAGNESUIUM-ZINC 333-133-8.3 MG TABS Take 1 tablet by mouth daily.   Yes Historical Provider, MD  cholecalciferol (VITAMIN D) 1000 UNITS tablet Take 1,000 Units by mouth daily.   Yes Historical Provider, MD  fenofibrate (  TRICOR) 145 MG tablet Take 145 mg by mouth daily.   Yes Historical Provider, MD  Flaxseed, Linseed, (FLAX  SEED OIL PO) Take 1 capsule by mouth daily.    Yes Historical Provider, MD  furosemide (LASIX) 20 MG tablet Take 20 mg by mouth daily.     Yes Historical Provider, MD  lidocaine (LIDODERM) 5 % Place 1 patch onto the skin daily as needed (hip pain).  05/22/16  Yes Historical Provider, MD  methimazole (TAPAZOLE) 5 MG tablet Take 5 mg by mouth daily.   Yes Historical Provider, MD  metoprolol succinate (TOPROL-XL) 25 MG 24 hr tablet Take 25 mg by mouth daily. 05/29/16  Yes Historical Provider, MD  nystatin cream (MYCOSTATIN) Apply 1 application topically daily. Apply under breasts and on stomach folds 06/13/16  Yes Historical Provider, MD  pantoprazole (PROTONIX) 40 MG tablet Take 1 tablet (40 mg total) by mouth daily. 02/28/15  Yes Kristen N Ward, DO  potassium chloride SA (K-DUR,KLOR-CON) 20 MEQ tablet Take 20 mEq by mouth daily.    Yes Historical Provider, MD  vitamin C (ASCORBIC ACID) 500 MG tablet Take 500 mg by mouth daily.   Yes Historical Provider, MD  oxyCODONE-acetaminophen (PERCOCET/ROXICET) 5-325 MG tablet Take 1 tablet by mouth every 4 (four) hours as needed for severe pain. 07/01/16   Leo Grosser, MD    Medications: . cefOXitin  2 g Intravenous Once  . famotidine (PEPCID) IV  20 mg Intravenous Q24H  . methimazole  5 mg Oral Daily  . metoprolol succinate  25 mg Oral Daily   . sodium chloride      Assessment/Plan Symptomatic cholelithiasis with gallstone in neck of GB Abnormal EKG Hyperthyroid - tapazole Hypertension FEN:  NPO/IV fluids ID: Cefoxitin pre op DVT: SCD  For cholecystectomy today.     LOS: 0 days    Vontae Court 07/02/2016 [phone]

## 2016-07-02 NOTE — Transfer of Care (Signed)
Immediate Anesthesia Transfer of Care Note  Patient: [name]  Procedure(s) Performed: Procedure(s): LAPAROSCOPIC CHOLECYSTECTOMY (N/A)  Patient Location: PACU  Anesthesia Type:General  Level of Consciousness: awake, alert  and oriented  Airway & Oxygen Therapy: Patient Spontanous Breathing and Patient connected to nasal cannula oxygen  Post-op Assessment: Report given to RN, Post -op Vital signs reviewed and stable and Patient moving all extremities X 4  Post vital signs: Reviewed and stable  Last Vitals:  Vitals:   07/02/16 0629 07/02/16 1425  BP: (!) 143/67 (!) (P) 165/89  Pulse: 65 (P) 71  Resp: 16 (P) 12  Temp: 37.4 C (P) 37.3 C    Last Pain:  Vitals:   07/02/16 0629  TempSrc: Oral  PainSc:          Complications: No apparent anesthesia complications

## 2016-07-02 NOTE — Progress Notes (Signed)
PROGRESS NOTE [facility]   [name]   [id number] DOB: Nov 15, 1932  DOA: 07/01/2016 PCP: Maximino Greenland, MD   Brief Narrative:  [name] is a 82 y.o. female with a medical history significant for, but not  limited to, hypothyroidism, HTN, and OSA.  Patient brought to ED by EMS for evaluation of nonradiating epigastric pain and vomiting. Found to have symptomatic cholelithiasis with gallstone in the neck of GB admitted for laparoscopic cholecystectomy.   Subjective:  Patient seen and examined at the PACU. Patient on pending of mild abdominal pain/soreness. No other complaints. Her nursing staff patient has been stable after surgery. Vital signs normal.    Assessment & Plan:  Symptomatic cholelithiasis - with gallstone in the neck of GB - status post lap cholecystectomy day 0 - Continue pain control as needed - Post op care as per surgery - Nothing by mouth - advance diet in the morning - And tinea pedis - CBC in the morning  Essential hypertension - stable - Resume home medications - Monitor BP  Hyperthyroidism - clinically euthyroid - Resume home medication  Nonspecific ST-T wave electrocardiographic changes - appears to be early repolarization, no changes from previous EKGs - will monitor, no further workup needed in this admission  DVT prophylaxis:  SQ Heparin Code Status:     Full code   Family Communication:    None at bedside Disposition Plan:    Anticipated discharge home when cleared by surgeon   Consultants:   Surgery  Procedures:   Abdominal ultrasound  Antimicrobials:   Cefoxitin 07/02/16   Objective: Vitals:   07/01/16 2223 07/02/16 0629 07/02/16 1425 07/02/16 1511  BP: 134/74 (!) 143/67 (!) 165/89 127/72  Pulse: 61 65 71 (!) 51  Resp: 16 16 12 12   Temp: 98.3 F (36.8 C) 99.4 F (37.4 C) 99.1 F (37.3 C) 99.1 F (37.3 C)  TempSrc: Oral Oral    SpO2: 93% 94% 97% 99%  Weight:      Height:        Intake/Output Summary  (Last 24 hours) at 07/02/16 1538 Last data filed at 07/02/16 1356  Gross per 24 hour  Intake             1550 ml  Output              150 ml  Net             1400 ml   Filed Weights   07/01/16 1214 07/01/16 1802  Weight: 94.3 kg (208 lb) 93.9 kg (207 lb)    Examination:  General exam: Appears calm and comfortable  Respiratory system: Clear to auscultation. No wheezes,crackle or rhonchi Cardiovascular system: S1 & S2 heard, RRR. No JVD, murmurs, rubs or gallops Gastrointestinal system: Abdomen mild distended, soft, non tender.  Central nervous system: Alert and oriented. No focal neurological deficits. Extremities: No pedal edema.  Skin: No rashes, lesions or ulcers Psychiatry: Mood & affect appropriate.     Data Reviewed: I have personally reviewed following labs and imaging studies  CBC:  Recent Labs Lab 07/01/16 1210 07/02/16 0602  WBC 8.1 11.7*  NEUTROABS 4.6  --   HGB 12.1 12.1  HCT 34.8* 34.7*  MCV 82.3 81.8  PLT 353 [id number]   Basic Metabolic Panel:  Recent Labs Lab 07/01/16 1210 07/02/16 0602  NA 136 135  K 3.2* 3.8  CL 103 102  CO2 26 27  GLUCOSE 115* 105*  BUN 19 9  CREATININE 0.75  0.66  CALCIUM 8.8* 8.5*   GFR: Estimated Creatinine Clearance: 57.6 mL/min (by C-G formula based on SCr of 0.66 mg/dL). Liver Function Tests:  Recent Labs Lab 07/01/16 1210 07/02/16 0602  AST 18 20  ALT 14 15  ALKPHOS 41 40  BILITOT 0.5 0.8  PROT 6.7 6.9  ALBUMIN 3.4* 3.3*    Recent Labs Lab 07/01/16 1210  LIPASE 38   No results for input(s): AMMONIA in the last 168 hours. Coagulation Profile:  Recent Labs Lab 07/01/16 1210  INR 0.99   Cardiac Enzymes:  Recent Labs Lab 07/01/16 1210 07/01/16 1814 07/02/16 0011 07/02/16 0602  TROPONINI <0.03 <0.03 <0.03 <0.03   BNP (last 3 results) No results for input(s): PROBNP in the last 8760 hours. HbA1C: No results for input(s): HGBA1C in the last 72 hours. CBG: No results for input(s): GLUCAP in the  last 168 hours. Lipid Profile: No results for input(s): CHOL, HDL, LDLCALC, TRIG, CHOLHDL, LDLDIRECT in the last 72 hours. Thyroid Function Tests: No results for input(s): TSH, T4TOTAL, FREET4, T3FREE, THYROIDAB in the last 72 hours. Anemia Panel: No results for input(s): VITAMINB12, FOLATE, FERRITIN, TIBC, IRON, RETICCTPCT in the last 72 hours. Sepsis Labs: No results for input(s): PROCALCITON, LATICACIDVEN in the last 168 hours.  Recent Results (from the past 240 hour(s))  Surgical pcr screen     Status: None   Collection Time: 07/02/16  2:17 AM  Result Value Ref Range Status   MRSA, PCR NEGATIVE NEGATIVE Final   Staphylococcus aureus NEGATIVE NEGATIVE Final    Comment:        The Xpert SA Assay (FDA approved for NASAL specimens in patients over 26 years of age), is one component of a comprehensive surveillance program.  Test performance has been validated by [facility] for patients greater than or equal to 18 year old. It is not intended to diagnose infection nor to guide or monitor treatment.      Radiology Studies: Dg Chest Port 1 View  Result Date: 07/01/2016 CLINICAL DATA:  Posttraumatic pain after trauma. 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 EXAM: PORTABLE CHEST 1 VIEW COMPARISON:  12/30/2008 chest CT, 06/28/2010 chest radiograph FINDINGS: Lung volumes are low with bibasilar atelectasis and crowding of interstitial lung markings. No pneumonic consolidations. There is mild vascular congestion. Heart is normal in size. No aortic aneurysm. No acute osseous abnormality. Surgical clips are seen about the right infrahilar portion of the chest. IMPRESSION: Low lung volumes with mild pulmonary vascular congestion. Stable postsurgical changes about the right hilum. Electronically Signed   By: Ashley Royalty M.D.   On: 07/01/2016 12:57   Ct Angio Chest/abd/pel For Dissection W And/or W/wo  Result Date: 07/01/2016 CLINICAL DATA:  Pt  having severe mid abd epigastric pain that started this AM. EXAM: CT ANGIOGRAPHY CHEST, ABDOMEN AND PELVIS TECHNIQUE: Multidetector CT imaging through the chest, abdomen and pelvis was performed using the standard protocol during bolus administration of intravenous contrast. Multiplanar reconstructed images and MIPs were obtained and reviewed to evaluate the vascular anatomy. CONTRAST:  100 ml iso 370. COMPARISON:  None. FINDINGS: CTA CHEST FINDINGS Cardiovascular: Left arm IV contrast administration. Innominate vein and SVC patent. Dilated central pulmonary arteries. Satisfactory opacification of pulmonary arteries noted, and there is no evidence of pulmonary emboli. Patent bilateral pulmonary veins drain into left atrium. Adequate contrast opacification of the thoracic aorta with no evidence of dissection, aneurysm, or stenosis. There is bovine variant brachiocephalic arch anatomy without proximal stenosis.  Tortuous right brachiocephalic and proximal left subclavian arteries. Minimal scattered calcified plaque in the aortic arch and descending thoracic segment. Mediastinum/Nodes: Enlarged heterogeneous thyroid with left lesion at least 2.6 cm. No hilar or mediastinal adenopathy. Calcified bilateral hilar lymph nodes. No pericardial effusion. Lungs/Pleura: Dependent atelectasis posteriorly in both lungs. Postop changes at the right hilum. No confluent airspace disease. Calcified granuloma in the superior segment left lower lobe. Musculoskeletal: Mild spondylitic changes throughout the thoracic spine. Negative for fracture. Review of the MIP images confirms the above findings. CTA ABDOMEN AND PELVIS FINDINGS VASCULAR Aorta: Scattered calcified plaque. No aneurysm, dissection, or stenosis. Celiac: Patent without evidence of aneurysm, dissection, vasculitis or significant stenosis. SMA: Patent without evidence of aneurysm, dissection, vasculitis or significant stenosis. Renals: Both renal arteries are patent without  evidence of aneurysm, dissection, vasculitis, fibromuscular dysplasia or significant stenosis. IMA: Patent without evidence of aneurysm, dissection, vasculitis or significant stenosis. Inflow: Scattered calcified plaque through bilateral common iliac arteries and internal iliac arteries. No aneurysm, dissection, or stenosis. Veins: No obvious venous abnormality within the limitations of this arterial phase study. Review of the MIP images confirms the above findings. NON-VASCULAR Hepatobiliary: 16 mm enhancing subcapsular lesion in the left lobe image 117/501, stable since scans from 08/19/2006, presumably benign lesion such as FNH. No new liver lesion. Partially calcified stones in the dependent aspect of the nondilated gallbladder. No intra or extrahepatic biliary ductal dilatation. Pancreas: Unremarkable Spleen: Unremarkable arterial phase Adrenals/Urinary Tract: Normal adrenals. No hydronephrosis. No renal mass. Urinary bladder nondistended. Stomach/Bowel: Stomach and small bowel nondilated. Normal appendix. Colonic diverticula most numerous in the sigmoid segment, without significant adjacent inflammatory/ edematous change. Lymphatic: No adenopathy localized. Musculoskeletal: Mild lumbar dextroscoliosis with multilevel degenerative changes. Bilateral hip osteoarthritis. Negative for fracture. Other: No ascites.  No free air. Review of the MIP images confirms the above findings. IMPRESSION: 1. Negative for acute PE or aortic dissection. 2. Dilated central pulmonary arteries suggesting pulmonary hypertension. 3. Cholelithiasis 4. Sigmoid diverticulosis 5. Thyromegaly with nodules Electronically Signed   By: Lucrezia Europe M.D.   On: 07/01/2016 14:48   US Abdomen Limited Ruq  Result Date: 07/01/2016 CLINICAL DATA:  Upper abdominal pain EXAM: US ABDOMEN LIMITED - RIGHT UPPER QUADRANT COMPARISON:  CT abdomen and pelvis Feb 28, 2015 ; CT angiogram chest including portions of the liver July 01, 2016 FINDINGS:  Gallbladder: There is a 1.5 cm calculus adherent in the neck of the gallbladder. There is no gallbladder wall thickening or pericholecystic fluid. There is slight sludge in the gallbladder. No sonographic Murphy sign noted by sonographer. Common bile duct: Diameter: 5 mm. There is no intrahepatic or extrahepatic biliary duct dilatation. Liver: No focal lesion identified. Liver echogenicity is somewhat increased. IMPRESSION: There is a 1.5 cm in length calculus adherent in the neck of the gallbladder. Mild sludge is noted in the gallbladder. No gallbladder wall thickening or pericholecystic fluid. Increased liver echogenicity is present, a finding most likely indicative of a degree of hepatic steatosis. No focal liver lesions are evident by ultrasound. Note that no lesion is identified near the junction of the right and left lobes as was noted on CT examination performed earlier in the day. It must be cautioned that the sensitivity of ultrasound for focal liver lesions is diminished in this circumstance. Electronically Signed   By: Lowella Grip III M.D.   On: 07/01/2016 15:36      Scheduled Meds: . famotidine (PEPCID) IV  20 mg Intravenous Q24H  . fentaNYL      .  methimazole  5 mg Oral Daily  . metoprolol succinate  25 mg Oral Daily   Continuous Infusions: . sodium chloride       LOS: 0 days    Chipper Oman, MD [facility] Pager [phone]  If 7PM-7AM, please contact night-coverage [email or web address] Password [facility] 07/02/2016, 3:38 PM

## 2016-07-02 NOTE — Progress Notes (Signed)
Dr. Silva at bedside.

## 2016-07-02 NOTE — Op Note (Signed)
Laparoscopic Cholecystectomy  Indications: This patient presents with acute calculous cholecystitis and will undergo laparoscopic cholecystectomy.  Pre-operative Diagnosis: see above  Post-operative Diagnosis: Same  Surgeon: Stark Klein   Assistants: Cedric Fishman, RNFA  Anesthesia: General endotracheal anesthesia and local  ASA Class: 3  Procedure Details  The patient was seen again in the Holding Room. The risks, benefits, complications, treatment options, and expected outcomes were discussed with the patient. The possibilities of  bleeding, recurrent infection, damage to nearby structures, the need for additional procedures, failure to diagnose a condition, the possible need to convert to an open procedure, and creating a complication requiring transfusion or operation were discussed with the patient. The likelihood of improving the patient's symptoms with return to their baseline status is good.    The patient and/or family concurred with the proposed plan, giving informed consent. The site of surgery properly noted. The patient was taken to Operating Room, and the procedure verified as Laparoscopic Cholecystectomy with possible Intraoperative Cholangiogram. A Time Out was held and the above information confirmed.  Prior to the induction of general anesthesia, antibiotic prophylaxis was administered. General endotracheal anesthesia was then administered and tolerated well. After the induction, the abdomen was prepped with Chloraprep and draped in the sterile fashion. The patient was positioned in the supine position.  Local anesthetic agent was injected into the skin near the umbilicus and an incision made. We dissected down to the abdominal fascia with blunt dissection.  The fascia was incised vertically and we entered the peritoneal cavity bluntly.  A pursestring suture of 0-Vicryl was placed around the fascial opening.  The Hasson cannula was inserted and secured with the stay suture.   Pneumoperitoneum was then created with CO2 and tolerated well without any adverse changes in the patient's vital signs. An 11-mm port was placed in the subxiphoid position.  Two 5-mm ports were placed in the right upper quadrant. All skin incisions were infiltrated with a local anesthetic agent before making the incision and placing the trocars.   We positioned the patient in reverse Trendelenburg, tilted slightly to the patient's left.   The gallbladder was tense and distended.  The gallbladder was aspirated with the [place].  The cystic artery was clipped.  The gallbladder was identified, the fundus grasped and retracted cephalad. Adhesions were lysed bluntly and with the electrocautery where indicated, taking care not to injure any adjacent organs or viscus.  The infundibulum was grasped and retracted laterally, exposing the peritoneum.  However, the gallbladder was very friable due to the infection.  Eventually, we went dome down in order to avoid shredding the duct.  Once the gallbladder tapered down, the cystic duct was identifiable, but appeared friable as well.  A cholangiogram was not performed in order to avoid damage to the common bile duct.    The cystic duct was  ligated with clips and divided. The gallbladder was placed in an Endocatch bag.  The gallbladder and Endocatch bag were then removed through the umbilical port site.  The liver bed was irrigated and inspected. Hemostasis was achieved with the electrocautery. SNOW hemostatic agent was applied to the raw surface of liver in the gallbladder fossa.  Copious irrigation was utilized and was repeatedly aspirated until clear.    We again inspected the right upper quadrant for hemostasis.  Pneumoperitoneum was released as we removed the trocars.   The pursestring suture was used to close the umbilical fascia.  4-0 Monocryl was used to close the skin.   The skin  was cleaned and dry, and Dermabond was applied. The patient was then extubated and  brought to the recovery room in stable condition. Instrument, sponge, and needle counts were correct at closure and at the conclusion of the case.   Findings: Acutely inflamed gallbladder with friable wall. Very distended with clear bile.  .    Estimated Blood Loss: 50 mL         Drains: none          Specimens: Gallbladder to pathology       Complications: None; patient tolerated the procedure well.         Disposition: PACU - hemodynamically stable.         Condition: stable

## 2016-07-02 NOTE — Anesthesia Procedure Notes (Addendum)
Procedure Name: Intubation Date/Time: 07/02/2016 12:27 PM Performed by: Garrison Columbus T Pre-anesthesia Checklist: Patient identified, Emergency Drugs available, Suction available and Patient being monitored Patient Re-evaluated:Patient Re-evaluated prior to inductionOxygen Delivery Method: Circle System Utilized Preoxygenation: Pre-oxygenation with 100% oxygen Intubation Type: IV induction and Rapid sequence Laryngoscope Size: Mac and 3 Grade View: Grade I Tube type: Oral Number of attempts: 1 Airway Equipment and Method: Stylet and Oral airway Placement Confirmation: ETT inserted through vocal cords under direct vision,  positive ETCO2 and breath sounds checked- equal and bilateral Secured at: 22 cm Tube secured with: Tape Dental Injury: Teeth and Oropharynx as per pre-operative assessment  Comments: Intubation by Delphia Grates, CRNA

## 2016-07-02 NOTE — Anesthesia Postprocedure Evaluation (Signed)
Anesthesia Post Note  Patient: [name]  Procedure(s) Performed: Procedure(s) (LRB): LAPAROSCOPIC CHOLECYSTECTOMY (N/A)  Patient location during evaluation: PACU Anesthesia Type: General Level of consciousness: awake Pain management: pain level controlled Vital Signs Assessment: post-procedure vital signs reviewed and stable Respiratory status: spontaneous breathing Cardiovascular status: stable Anesthetic complications: no    Last Vitals:  Vitals:   07/02/16 1511 07/02/16 1535  BP: 127/72 119/63  Pulse: (!) 51 (!) 55  Resp: 12 16  Temp: 37.3 C 36.9 C    Last Pain:  Vitals:   07/02/16 1718  TempSrc:   PainSc: 8                  EDWARDS,Arleatha Philipps

## 2016-07-02 NOTE — Discharge Instructions (Signed)

## 2016-07-02 NOTE — Progress Notes (Signed)
Upper and lower dentures returned to pt and placed in mouth 

## 2016-07-02 NOTE — Anesthesia Preprocedure Evaluation (Addendum)
Anesthesia Evaluation  Patient identified by MRN, date of birth, ID band Patient awake    Reviewed: Allergy & Precautions, NPO status , Patient's Chart, lab work & pertinent test results  History of Anesthesia Complications Negative for: history of anesthetic complications  Airway Mallampati: III  TM Distance: >3 FB Neck ROM: Full    Dental  (+) Dental Advisory Given, Edentulous Upper, Edentulous Lower   Pulmonary neg shortness of breath, sleep apnea , neg COPD, neg recent URI,  bronchiectasis   breath sounds clear to auscultation       Cardiovascular hypertension, Pt. on medications (-) angina(-) Past MI, (-) Cardiac Stents and (-) CABG  Rhythm:Regular Rate:Abnormal  HLD   Neuro/Psych neg Seizures negative neurological ROS     GI/Hepatic Neg liver ROS, Symptomatic cholelithiasis   Endo/Other  neg diabetesHypothyroidism Morbid obesity  Renal/GU negative Renal ROS     Musculoskeletal osteoporosis   Abdominal (+) + obese,   Peds  Hematology negative hematology ROS (+)   Anesthesia Other Findings   Reproductive/Obstetrics                          Anesthesia Physical Anesthesia Plan  ASA: III  Anesthesia Plan: General   Post-op Pain Management:    Induction: Intravenous, Rapid sequence and Cricoid pressure planned  Airway Management Planned: Oral ETT  Additional Equipment:   Intra-op Plan:   Post-operative Plan: Extubation in OR  Informed Consent: I have reviewed the patients History and Physical, chart, labs and discussed the procedure including the risks, benefits and alternatives for the proposed anesthesia with the patient or authorized representative who has indicated his/her understanding and acceptance.   Dental advisory given  Plan Discussed with: CRNA, Anesthesiologist and Surgeon  Anesthesia Plan Comments: (Risks of general anesthesia discussed including, but not  limited to, sore throat, hoarse voice, chipped/damaged teeth, injury to vocal cords, nausea and vomiting, allergic reactions, lung infection, heart attack, stroke, and death. All questions answered. )      Anesthesia Quick Evaluation

## 2016-07-03 ENCOUNTER — Encounter (HOSPITAL_COMMUNITY): Payer: Self-pay | Admitting: General Surgery

## 2016-07-03 ENCOUNTER — Observation Stay (HOSPITAL_COMMUNITY): Payer: Medicare Other

## 2016-07-03 DIAGNOSIS — R109 Unspecified abdominal pain: Secondary | ICD-10-CM

## 2016-07-03 DIAGNOSIS — K8020 Calculus of gallbladder without cholecystitis without obstruction: Secondary | ICD-10-CM | POA: Diagnosis not present

## 2016-07-03 DIAGNOSIS — R0602 Shortness of breath: Secondary | ICD-10-CM | POA: Diagnosis not present

## 2016-07-03 DIAGNOSIS — E0590 Thyrotoxicosis, unspecified without thyrotoxic crisis or storm: Secondary | ICD-10-CM | POA: Diagnosis not present

## 2016-07-03 DIAGNOSIS — I10 Essential (primary) hypertension: Secondary | ICD-10-CM | POA: Diagnosis not present

## 2016-07-03 DIAGNOSIS — R1012 Left upper quadrant pain: Secondary | ICD-10-CM

## 2016-07-03 MED ORDER — IPRATROPIUM-ALBUTEROL 0.5-2.5 (3) MG/3ML IN SOLN
3.0000 mL | Freq: Four times a day (QID) | RESPIRATORY_TRACT | Status: DC
  Administered 2016-07-03: 3 mL via RESPIRATORY_TRACT
  Filled 2016-07-03: qty 3

## 2016-07-03 MED ORDER — AMOXICILLIN-POT CLAVULANATE 875-125 MG PO TABS
1.0000 | ORAL_TABLET | Freq: Two times a day (BID) | ORAL | Status: DC
  Administered 2016-07-03 – 2016-07-05 (×5): 1 via ORAL
  Filled 2016-07-03 (×5): qty 1

## 2016-07-03 MED ORDER — OXYCODONE HCL 5 MG PO TABS
5.0000 mg | ORAL_TABLET | ORAL | Status: DC | PRN
  Administered 2016-07-03 – 2016-07-05 (×5): 10 mg via ORAL
  Filled 2016-07-03 (×5): qty 2

## 2016-07-03 MED ORDER — FAMOTIDINE 20 MG PO TABS
20.0000 mg | ORAL_TABLET | Freq: Two times a day (BID) | ORAL | Status: DC
  Administered 2016-07-03 – 2016-07-05 (×4): 20 mg via ORAL
  Filled 2016-07-03 (×4): qty 1

## 2016-07-03 MED ORDER — DM-GUAIFENESIN ER 30-600 MG PO TB12
1.0000 | ORAL_TABLET | Freq: Two times a day (BID) | ORAL | Status: DC | PRN
  Filled 2016-07-03: qty 1

## 2016-07-03 MED ORDER — HYDROCODONE-ACETAMINOPHEN 5-325 MG PO TABS: 1.0000 | ORAL_TABLET | ORAL | Status: DC | PRN

## 2016-07-03 MED ORDER — HYDROMORPHONE HCL 1 MG/ML IJ SOLN
0.5000 mg | INTRAMUSCULAR | Status: DC | PRN
  Administered 2016-07-03: 0.5 mg via INTRAVENOUS
  Filled 2016-07-03: qty 1

## 2016-07-03 MED ORDER — ACETAMINOPHEN 500 MG PO TABS: 500.0000 mg | ORAL_TABLET | ORAL | Status: DC | PRN

## 2016-07-03 MED ORDER — OXYCODONE HCL 5 MG PO TABS: 5.0000 mg | ORAL_TABLET | ORAL | Status: DC | PRN

## 2016-07-03 NOTE — Progress Notes (Signed)
[facility] Progress Note  1 Day Post-Op  Subjective: POD#1. NAE. C/o abdominal pain. Pain improves with medication, worse with movement. Eating a soft breakfast (first meal since surgery). Denies nausea. Using bedside toilet. denies flatus or BM. Has not ambulated in hall.   Objective: Vital signs in last 24 hours: Temp:  [98.3 F (36.8 C)-99.5 F (37.5 C)] 98.3 F (36.8 C) (09/26 0602) Pulse Rate:  [51-76] 71 (09/26 0602) Resp:  [12-16] 15 (09/26 0602) BP: (119-165)/(63-95) 120/81 (09/26 0602) SpO2:  [92 %-100 %] 95 % (09/26 0602) Last BM Date: 07/01/16  Intake/Output from previous day: 09/25 0701 - 09/26 0700 In: 1260 [P.O.:60; I.V.:1200] Out: 350 [Urine:200; Blood:150] Intake/Output this shift: No intake/output data recorded.  PE: Gen:  Alert, NAD, pleasant Card:  RRR, no M/G/R  Pulm: [place] in place; CTA, no W/R/R; Pulling 750 on IS Abd: Soft, appropriately tender, ND, hypoactive BS, incisions C/D/I, some ecchymosis around umbilicus.  Lab Results:   Recent Labs  07/01/16 1210 07/02/16 0602  WBC 8.1 11.7*  HGB 12.1 12.1  HCT 34.8* 34.7*  PLT 353 319   BMET  Recent Labs  07/01/16 1210 07/02/16 0602  NA 136 135  K 3.2* 3.8  CL 103 102  CO2 26 27  GLUCOSE 115* 105*  BUN 19 9  CREATININE 0.75 0.66  CALCIUM 8.8* 8.5*   PT/INR  Recent Labs  07/01/16 1210  LABPROT 13.1  INR 0.99   CMP     Component Value Date/Time   NA 135 07/02/2016 0602   K 3.8 07/02/2016 0602   CL 102 07/02/2016 0602   CO2 27 07/02/2016 0602   GLUCOSE 105 (H) 07/02/2016 0602   BUN 9 07/02/2016 0602   CREATININE 0.66 07/02/2016 0602   CALCIUM 8.5 (L) 07/02/2016 0602   PROT 6.9 07/02/2016 0602   ALBUMIN 3.3 (L) 07/02/2016 0602   AST 20 07/02/2016 0602   ALT 15 07/02/2016 0602   ALKPHOS 40 07/02/2016 0602   BILITOT 0.8 07/02/2016 0602   GFRNONAA >60 07/02/2016 0602   GFRAA >60 07/02/2016 0602   Lipase     Component Value Date/Time   LIPASE 38 07/01/2016  1210       Studies/Results: Dg Chest Port 1 View  Result Date: 07/01/2016 CLINICAL DATA:  Posttraumatic pain after trauma. 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 EXAM: PORTABLE CHEST 1 VIEW COMPARISON:  12/30/2008 chest CT, 06/28/2010 chest radiograph FINDINGS: Lung volumes are low with bibasilar atelectasis and crowding of interstitial lung markings. No pneumonic consolidations. There is mild vascular congestion. Heart is normal in size. No aortic aneurysm. No acute osseous abnormality. Surgical clips are seen about the right infrahilar portion of the chest. IMPRESSION: Low lung volumes with mild pulmonary vascular congestion. Stable postsurgical changes about the right hilum. Electronically Signed   By: Ashley Royalty M.D.   On: 07/01/2016 12:57   Ct Angio Chest/abd/pel For Dissection W And/or W/wo  Result Date: 07/01/2016 CLINICAL DATA:  Pt having severe mid abd epigastric pain that started this AM. EXAM: CT ANGIOGRAPHY CHEST, ABDOMEN AND PELVIS TECHNIQUE: Multidetector CT imaging through the chest, abdomen and pelvis was performed using the standard protocol during bolus administration of intravenous contrast. Multiplanar reconstructed images and MIPs were obtained and reviewed to evaluate the vascular anatomy. CONTRAST:  100 ml iso 370. COMPARISON:  None. FINDINGS: CTA CHEST FINDINGS Cardiovascular: Left arm IV contrast administration. Innominate vein and SVC patent. Dilated central pulmonary arteries. Satisfactory opacification of  pulmonary arteries noted, and there is no evidence of pulmonary emboli. Patent bilateral pulmonary veins drain into left atrium. Adequate contrast opacification of the thoracic aorta with no evidence of dissection, aneurysm, or stenosis. There is bovine variant brachiocephalic arch anatomy without proximal stenosis. Tortuous right brachiocephalic and proximal left subclavian arteries. Minimal scattered calcified plaque in the  aortic arch and descending thoracic segment. Mediastinum/Nodes: Enlarged heterogeneous thyroid with left lesion at least 2.6 cm. No hilar or mediastinal adenopathy. Calcified bilateral hilar lymph nodes. No pericardial effusion. Lungs/Pleura: Dependent atelectasis posteriorly in both lungs. Postop changes at the right hilum. No confluent airspace disease. Calcified granuloma in the superior segment left lower lobe. Musculoskeletal: Mild spondylitic changes throughout the thoracic spine. Negative for fracture. Review of the MIP images confirms the above findings. CTA ABDOMEN AND PELVIS FINDINGS VASCULAR Aorta: Scattered calcified plaque. No aneurysm, dissection, or stenosis. Celiac: Patent without evidence of aneurysm, dissection, vasculitis or significant stenosis. SMA: Patent without evidence of aneurysm, dissection, vasculitis or significant stenosis. Renals: Both renal arteries are patent without evidence of aneurysm, dissection, vasculitis, fibromuscular dysplasia or significant stenosis. IMA: Patent without evidence of aneurysm, dissection, vasculitis or significant stenosis. Inflow: Scattered calcified plaque through bilateral common iliac arteries and internal iliac arteries. No aneurysm, dissection, or stenosis. Veins: No obvious venous abnormality within the limitations of this arterial phase study. Review of the MIP images confirms the above findings. NON-VASCULAR Hepatobiliary: 16 mm enhancing subcapsular lesion in the left lobe image 117/501, stable since scans from 08/19/2006, presumably benign lesion such as FNH. No new liver lesion. Partially calcified stones in the dependent aspect of the nondilated gallbladder. No intra or extrahepatic biliary ductal dilatation. Pancreas: Unremarkable Spleen: Unremarkable arterial phase Adrenals/Urinary Tract: Normal adrenals. No hydronephrosis. No renal mass. Urinary bladder nondistended. Stomach/Bowel: Stomach and small bowel nondilated. Normal appendix. Colonic  diverticula most numerous in the sigmoid segment, without significant adjacent inflammatory/ edematous change. Lymphatic: No adenopathy localized. Musculoskeletal: Mild lumbar dextroscoliosis with multilevel degenerative changes. Bilateral hip osteoarthritis. Negative for fracture. Other: No ascites.  No free air. Review of the MIP images confirms the above findings. IMPRESSION: 1. Negative for acute PE or aortic dissection. 2. Dilated central pulmonary arteries suggesting pulmonary hypertension. 3. Cholelithiasis 4. Sigmoid diverticulosis 5. Thyromegaly with nodules Electronically Signed   By: Lucrezia Europe M.D.   On: 07/01/2016 14:48   US Abdomen Limited Ruq  Result Date: 07/01/2016 CLINICAL DATA:  Upper abdominal pain EXAM: US ABDOMEN LIMITED - RIGHT UPPER QUADRANT COMPARISON:  CT abdomen and pelvis Feb 28, 2015 ; CT angiogram chest including portions of the liver July 01, 2016 FINDINGS: Gallbladder: There is a 1.5 cm calculus adherent in the neck of the gallbladder. There is no gallbladder wall thickening or pericholecystic fluid. There is slight sludge in the gallbladder. No sonographic Murphy sign noted by sonographer. Common bile duct: Diameter: 5 mm. There is no intrahepatic or extrahepatic biliary duct dilatation. Liver: No focal lesion identified. Liver echogenicity is somewhat increased. IMPRESSION: There is a 1.5 cm in length calculus adherent in the neck of the gallbladder. Mild sludge is noted in the gallbladder. No gallbladder wall thickening or pericholecystic fluid. Increased liver echogenicity is present, a finding most likely indicative of a degree of hepatic steatosis. No focal liver lesions are evident by ultrasound. Note that no lesion is identified near the junction of the right and left lobes as was noted on CT examination performed earlier in the day. It must be cautioned that the sensitivity of ultrasound for focal  liver lesions is diminished in this circumstance. Electronically  Signed   By: Lowella Grip III M.D.   On: 07/01/2016 15:36   Anti-infectives: Anti-infectives    Start     Dose/Rate Route Frequency Ordered Stop   07/02/16 2000  cefOXitin (MEFOXIN) 2 g in dextrose 5 % 50 mL IVPB     2 g 100 mL/hr over 30 Minutes Intravenous  Once 07/02/16 1545 07/02/16 2158   07/02/16 1000  cefOXitin (MEFOXIN) 2 g in dextrose 5 % 50 mL IVPB     2 g 100 mL/hr over 30 Minutes Intravenous  Once 07/02/16 [id number] 07/02/16 1116     Assessment/Plan Acute calculous cholecystitis S/p laparoscopic cholecystectomy 07/02/16, Dr. Stark Klein POD#1. Unable to do IOC secondary to friable GB. - PO pain control - incentive spirometry - ambulate  ID: PO augmentin [id number] BID   Plan: discharge home tomorrow  Will follow up in our office in two weeks   LOS: 0 days    Jill Alexanders , [facility] Surgery 07/03/2016, 9:52 AM Pager: [phone] Consults: [phone] Mon-Fri 7:00 am-4:30 pm Sat-Sun 7:00 am-11:30 am

## 2016-07-03 NOTE — Progress Notes (Signed)
PROGRESS NOTE    [name]  [id number] DOB: [birth year] DOA: 07/01/2016 PCP: Maximino Greenland, MD     Brief Narrative:  82 y.o.BF PMHx Hypothyroidism, HTN, and OSA, Bronchiectasis (isolated to RML; S/P RML partial resection),.   Patient brought to ED by EMS for evaluation of nonradiating epigastric pain and vomiting. Found to have symptomatic cholelithiasis with gallstone in the neck of GB admitted for laparoscopic cholecystectomy.     Subjective: 9/26  A/O 4, appropriate dominant tenderness, secondary to surgery. States she does not have OSA and has never had a sleep study.   Assessment & Plan:   Principal Problem:   Symptomatic cholelithiasis Active Problems:   Essential hypertension   Hyperthyroidism   Nonspecific ST-T wave electrocardiographic changes   Abdominal pain   Symptomatic Cholelithiasis  - with gallstone in the neck of GB, S/P laparoscopic Cholecystectomy  - Continue pain control as needed - Post op care as per surgery - Advance diet as tolerated - Gallbladder sent for pathology per surgical note. Pathology pending -Continue antibiotics per surgery  Essential hypertension  - stable - Toprol 25 mg daily  Hyperthyroidism - Methimazole 5 mg daily -TSH pending  Nonspecific ST-T wave electrocardiographic changes  -Troponin 3 negative - appears to be early repolarization, no changes from previous EKGs    DVT prophylaxis: Subcutaneous heparin Code Status: Full code Family Communication: None Disposition Plan: Per surgery    Consultants:  Dr.Douglas Ninfa Linden CCS    Procedures/Significant Events:  9/25 Acute calculous cholecystitis and will undergo laparoscopic cholecystectomy.    Cultures 9/25 gallbladder pathology pending   Antimicrobials: Cefoxitin 9/25>> 9/25 Augmentin 9/26>>   Devices    LINES / TUBES:      Continuous Infusions: . sodium chloride       Objective: Vitals:   07/02/16 1535 07/02/16 2045  07/03/16 0602 07/03/16 1408  BP: 119/63 (!) 124/95 120/81 (!) 145/97  Pulse: (!) 55 76 71 82  Resp: 16 16 15    Temp: 98.4 F (36.9 C) 99.5 F (37.5 C) 98.3 F (36.8 C) 99.2 F (37.3 C)  TempSrc: Oral Axillary Oral Oral  SpO2: 100% 92% 95% 98%  Weight:      Height:        Intake/Output Summary (Last 24 hours) at 07/03/16 1654 Last data filed at 07/03/16 1547  Gross per 24 hour  Intake               60 ml  Output             1600 ml  Net            -1540 ml   Filed Weights   07/01/16 1214 07/01/16 1802  Weight: 94.3 kg (208 lb) 93.9 kg (207 lb)    Examination:  General: A/O 4, appropriate abdominal tenderness, No acute respiratory distress Eyes: negative scleral hemorrhage, negative anisocoria, negative icterus ENT: Negative Runny nose, negative gingival bleeding, Neck:  Negative scars, masses, torticollis, lymphadenopathy, JVD Lungs: Clear to auscultation bilaterally without wheezes or crackles Cardiovascular: Regular rate and rhythm without murmur gallop or rub normal S1 and S2 Abdomen: Positive abdominal pain to palpation, nondistended, positive soft, bowel sounds, no rebound, no ascites, no appreciable mass Extremities: No significant cyanosis, clubbing, or edema bilateral lower extremities Skin: Negative rashes, lesions, ulcers, surgical incision negative sign of infection Psychiatric:  Negative depression, negative anxiety, negative fatigue, negative mania  Central nervous system:  Cranial nerves II through XII intact, tongue/uvula midline, all extremities muscle strength 5/5,  sensation intact throughout, negative dysarthria, negative expressive aphasia, negative receptive aphasia.  .     Data Reviewed: Care during the described time interval was provided by me .  I have reviewed this patient's available data, including medical history, events of note, physical examination, and all test results as part of my evaluation. I have personally reviewed and interpreted all  radiology studies.  CBC:  Recent Labs Lab 07/01/16 1210 07/02/16 0602  WBC 8.1 11.7*  NEUTROABS 4.6  --   HGB 12.1 12.1  HCT 34.8* 34.7*  MCV 82.3 81.8  PLT 353 [id number]   Basic Metabolic Panel:  Recent Labs Lab 07/01/16 1210 07/02/16 0602  NA 136 135  K 3.2* 3.8  CL 103 102  CO2 26 27  GLUCOSE 115* 105*  BUN 19 9  CREATININE 0.75 0.66  CALCIUM 8.8* 8.5*   GFR: Estimated Creatinine Clearance: 57.6 mL/min (by C-G formula based on SCr of 0.66 mg/dL). Liver Function Tests:  Recent Labs Lab 07/01/16 1210 07/02/16 0602  AST 18 20  ALT 14 15  ALKPHOS 41 40  BILITOT 0.5 0.8  PROT 6.7 6.9  ALBUMIN 3.4* 3.3*    Recent Labs Lab 07/01/16 1210  LIPASE 38   No results for input(s): AMMONIA in the last 168 hours. Coagulation Profile:  Recent Labs Lab 07/01/16 1210  INR 0.99   Cardiac Enzymes:  Recent Labs Lab 07/01/16 1210 07/01/16 1814 07/02/16 0011 07/02/16 0602  TROPONINI <0.03 <0.03 <0.03 <0.03   BNP (last 3 results) No results for input(s): PROBNP in the last 8760 hours. HbA1C: No results for input(s): HGBA1C in the last 72 hours. CBG: No results for input(s): GLUCAP in the last 168 hours. Lipid Profile: No results for input(s): CHOL, HDL, LDLCALC, TRIG, CHOLHDL, LDLDIRECT in the last 72 hours. Thyroid Function Tests: No results for input(s): TSH, T4TOTAL, FREET4, T3FREE, THYROIDAB in the last 72 hours. Anemia Panel: No results for input(s): VITAMINB12, FOLATE, FERRITIN, TIBC, IRON, RETICCTPCT in the last 72 hours. Sepsis Labs: No results for input(s): PROCALCITON, LATICACIDVEN in the last 168 hours.  Recent Results (from the past 240 hour(s))  Surgical pcr screen     Status: None   Collection Time: 07/02/16  2:17 AM  Result Value Ref Range Status   MRSA, PCR NEGATIVE NEGATIVE Final   Staphylococcus aureus NEGATIVE NEGATIVE Final    Comment:        The Xpert SA Assay (FDA approved for NASAL specimens in patients over 85 years of age), is  one component of a comprehensive surveillance program.  Test performance has been validated by [facility] for patients greater than or equal to 51 year old. It is not intended to diagnose infection nor to guide or monitor treatment.          Radiology Studies: Dg Chest Port 1 View  Result Date: 07/03/2016 CLINICAL DATA:  Shortness of breath, history of bronchiectasis and hypertension. Nonsmoker. EXAM: PORTABLE CHEST 1 VIEW COMPARISON:  Chest x-ray of July 01, 2016 FINDINGS: The lungs remain hypoinflated. The pulmonary vascular congestion has improved slightly. There remain increased densities at both lung bases. Small amounts of pleural fluid are likely present. Surgical clips in the right hilar region are stable. The cardiac silhouette is enlarged. The pulmonary vascularity is less prominent. The observed bony thorax exhibits no acute abnormality. There is mild gaseous distention of bowel in the upper abdomen. IMPRESSION: Persistent hypo inflation. Slight improvement in pulmonary interstitial edema. Persistent bibasilar atelectasis. Electronically Signed   By:  David  Martinique M.D.   On: 07/03/2016 15:10        Scheduled Meds: . amoxicillin-clavulanate  1 tablet Oral Q12H  . famotidine  20 mg Oral BID  . ipratropium-albuterol  3 mL Nebulization Q6H  . methimazole  5 mg Oral Daily  . metoprolol succinate  25 mg Oral Daily   Continuous Infusions: . sodium chloride       LOS: 0 days    Time spent:40 min    Santosh Petter, Geraldo Docker, MD [facility] Pager [phone]  If 7PM-7AM, please contact night-coverage [email or web address] Password [facility] 07/03/2016, 4:54 PM

## 2016-07-04 DIAGNOSIS — Z79899 Other long term (current) drug therapy: Secondary | ICD-10-CM | POA: Diagnosis not present

## 2016-07-04 DIAGNOSIS — R1013 Epigastric pain: Secondary | ICD-10-CM

## 2016-07-04 DIAGNOSIS — I7101 Dissection of thoracic aorta: Secondary | ICD-10-CM

## 2016-07-04 DIAGNOSIS — B353 Tinea pedis: Secondary | ICD-10-CM | POA: Diagnosis present

## 2016-07-04 DIAGNOSIS — I10 Essential (primary) hypertension: Secondary | ICD-10-CM | POA: Diagnosis not present

## 2016-07-04 DIAGNOSIS — R0600 Dyspnea, unspecified: Secondary | ICD-10-CM | POA: Diagnosis not present

## 2016-07-04 DIAGNOSIS — R05 Cough: Secondary | ICD-10-CM

## 2016-07-04 DIAGNOSIS — E0590 Thyrotoxicosis, unspecified without thyrotoxic crisis or storm: Secondary | ICD-10-CM | POA: Diagnosis present

## 2016-07-04 DIAGNOSIS — R059 Cough, unspecified: Secondary | ICD-10-CM

## 2016-07-04 DIAGNOSIS — M810 Age-related osteoporosis without current pathological fracture: Secondary | ICD-10-CM | POA: Diagnosis present

## 2016-07-04 DIAGNOSIS — Z7982 Long term (current) use of aspirin: Secondary | ICD-10-CM | POA: Diagnosis not present

## 2016-07-04 DIAGNOSIS — G4733 Obstructive sleep apnea (adult) (pediatric): Secondary | ICD-10-CM | POA: Diagnosis present

## 2016-07-04 DIAGNOSIS — K8020 Calculus of gallbladder without cholecystitis without obstruction: Secondary | ICD-10-CM | POA: Diagnosis not present

## 2016-07-04 DIAGNOSIS — Z6839 Body mass index (BMI) 39.0-39.9, adult: Secondary | ICD-10-CM | POA: Diagnosis not present

## 2016-07-04 DIAGNOSIS — K8000 Calculus of gallbladder with acute cholecystitis without obstruction: Secondary | ICD-10-CM | POA: Diagnosis present

## 2016-07-04 LAB — COMPREHENSIVE METABOLIC PANEL
ALT: 45 U/L (ref 14–54)
AST: 37 U/L (ref 15–41)
Albumin: 2.4 g/dL — ABNORMAL LOW (ref 3.5–5.0)
Alkaline Phosphatase: 36 U/L — ABNORMAL LOW (ref 38–126)
Anion gap: 9 (ref 5–15)
BUN: 5 mg/dL — ABNORMAL LOW (ref 6–20)
CO2: 27 mmol/L (ref 22–32)
Calcium: 8.2 mg/dL — ABNORMAL LOW (ref 8.9–10.3)
Chloride: 102 mmol/L (ref 101–111)
Creatinine, Ser: 0.63 mg/dL (ref 0.44–1.00)
GFR calc Af Amer: >60 mL/min (ref >60)
GFR calc non Af Amer: >60 mL/min (ref >60)
Glucose, Bld: 99 mg/dL (ref 65–99)
Potassium: 3.9 mmol/L (ref 3.5–5.1)
Sodium: 138 mmol/L (ref 135–145)
Total Bilirubin: 1.1 mg/dL (ref 0.3–1.2)
Total Protein: 5.4 g/dL — ABNORMAL LOW (ref 6.5–8.1)

## 2016-07-04 LAB — TSH: TSH: 0.454 u[IU]/mL (ref 0.350–4.500)

## 2016-07-04 LAB — CBC
HCT: 26.1 % — ABNORMAL LOW (ref 36.0–46.0)
Hemoglobin: 9.1 g/dL — ABNORMAL LOW (ref 12.0–15.0)
MCH: 28.7 pg (ref 26.0–34.0)
MCHC: 34.9 g/dL (ref 30.0–36.0)
MCV: 82.3 fL (ref 78.0–100.0)
Platelets: 241 10*3/uL (ref 150–400)
RBC: 3.17 MIL/uL — ABNORMAL LOW (ref 3.87–5.11)
RDW: 14.5 % (ref 11.5–15.5)
WBC: 8.6 10*3/uL (ref 4.0–10.5)

## 2016-07-04 MED ORDER — FUROSEMIDE 10 MG/ML IJ SOLN
40.0000 mg | Freq: Once | INTRAMUSCULAR | Status: AC
  Administered 2016-07-04: 40 mg via INTRAVENOUS
  Filled 2016-07-04: qty 4

## 2016-07-04 NOTE — Progress Notes (Signed)
Patient ID: [name], female   DOB: [birth year], 82 y.o.   MRN: [id number]  [facility] Surgery Progress Note  2 Days Post-Op  Subjective: Feeling well. Pain better controlled and appetite improving. Denies nausea. Feels like she is ready to go home. States that shortness of breath she was experiencing yesterday was normal and her PCP knows about it; she had a partial lung removal [id number].  Objective: Vital signs in last 24 hours: Temp:  [98.4 F (36.9 C)-99.2 F (37.3 C)] 98.4 F (36.9 C) (09/27 0434) Pulse Rate:  [50-82] 50 (09/27 0434) Resp:  [17] 17 (09/27 0434) BP: (118-145)/(61-97) 118/67 (09/27 0434) SpO2:  [93 %-98 %] 93 % (09/27 0434) Last BM Date: 07/01/16  Intake/Output from previous day: 09/26 0701 - 09/27 0700 In: -  Out: 1400 [Urine:1400] Intake/Output this shift: No intake/output data recorded.  PE: Gen:  Alert, NAD, pleasant Pulm: CTAB Abd: Soft, appropriately tender, ND, +BS, incisions C/D/I, some ecchymosis around umbilicus.  Lab Results:   Recent Labs  07/02/16 0602 07/04/16 0502  WBC 11.7* 8.6  HGB 12.1 9.1*  HCT 34.7* 26.1*  PLT 319 241   BMET  Recent Labs  07/02/16 0602 07/04/16 0502  NA 135 138  K 3.8 3.9  CL 102 102  CO2 27 27  GLUCOSE 105* 99  BUN 9 5*  CREATININE 0.66 0.63  CALCIUM 8.5* 8.2*   PT/INR  Recent Labs  07/01/16 1210  LABPROT 13.1  INR 0.99   CMP     Component Value Date/Time   NA 138 07/04/2016 0502   K 3.9 07/04/2016 0502   CL 102 07/04/2016 0502   CO2 27 07/04/2016 0502   GLUCOSE 99 07/04/2016 0502   BUN 5 (L) 07/04/2016 0502   CREATININE 0.63 07/04/2016 0502   CALCIUM 8.2 (L) 07/04/2016 0502   PROT 5.4 (L) 07/04/2016 0502   ALBUMIN 2.4 (L) 07/04/2016 0502   AST 37 07/04/2016 0502   ALT 45 07/04/2016 0502   ALKPHOS 36 (L) 07/04/2016 0502   BILITOT 1.1 07/04/2016 0502   GFRNONAA >60 07/04/2016 0502   GFRAA >60 07/04/2016 0502   Lipase     Component Value Date/Time   LIPASE 38  07/01/2016 1210       Studies/Results: Dg Chest Port 1 View  Result Date: 07/03/2016 CLINICAL DATA:  Shortness of breath, history of bronchiectasis and hypertension. Nonsmoker. EXAM: PORTABLE CHEST 1 VIEW COMPARISON:  Chest x-ray of July 01, 2016 FINDINGS: The lungs remain hypoinflated. The pulmonary vascular congestion has improved slightly. There remain increased densities at both lung bases. Small amounts of pleural fluid are likely present. Surgical clips in the right hilar region are stable. The cardiac silhouette is enlarged. The pulmonary vascularity is less prominent. The observed bony thorax exhibits no acute abnormality. There is mild gaseous distention of bowel in the upper abdomen. IMPRESSION: Persistent hypo inflation. Slight improvement in pulmonary interstitial edema. Persistent bibasilar atelectasis. Electronically Signed   By: David  Martinique M.D.   On: 07/03/2016 15:10    Anti-infectives: Anti-infectives    Start     Dose/Rate Route Frequency Ordered Stop   07/03/16 1100  amoxicillin-clavulanate (AUGMENTIN) 875-125 MG per tablet 1 tablet     1 tablet Oral Every 12 hours 07/03/16 1028     07/02/16 2000  cefOXitin (MEFOXIN) 2 g in dextrose 5 % 50 mL IVPB     2 g 100 mL/hr over 30 Minutes Intravenous  Once 07/02/16 1545 07/02/16 2158   07/02/16 1000  cefOXitin (MEFOXIN) 2 g in dextrose 5 % 50 mL IVPB     2 g 100 mL/hr over 30 Minutes Intravenous  Once 07/02/16 [id number] 07/02/16 1116       Assessment/Plan Acute calculous cholecystitis S/p laparoscopic cholecystectomy 07/02/16, Dr. Stark Klein POD#2. Unable to do IOC secondary to friable GB. - PO pain control - continue incentive spirometry and ambulation  Shortness of breath - 2-D echo pending - continug IS  ID: PO augmentin 875 BID  FEN - soft and advance as tolerated VTE - heparin  Plan: once cleared for SOB patient is ready for discharge from general surgery standpoint. She will need to be on PO augmentin  x1 week, and follow-up with Dr. Barry Dienes in 3 weeks. General surgery will sign off, but please contact us with any concerns.   LOS: 0 days    Jerrye Beavers , [facility] Surgery 07/04/2016, 8:50 AM Pager: [phone] Consults: [phone] Mon-Fri 7:00 am-4:30 pm Sat-Sun 7:00 am-11:30 am

## 2016-07-04 NOTE — Progress Notes (Signed)
PROGRESS NOTE    [name]  [id number] DOB: [birth year] DOA: 07/01/2016 PCP: Maximino Greenland, MD     Brief Narrative:  82 y.o.BF PMHx Hypothyroidism, HTN, and OSA, Bronchiectasis (isolated to RML; S/P RML partial resection),.   Patient brought to ED by EMS for evaluation of nonradiating epigastric pain and vomiting. Found to have symptomatic cholelithiasis with gallstone in the neck of GB admitted for laparoscopic cholecystectomy.     Subjective: Denies cp, sob   Assessment & Plan:   Principal Problem:   Symptomatic cholelithiasis Active Problems:   Essential hypertension   Hyperthyroidism   Nonspecific ST-T wave electrocardiographic changes   Abdominal pain  Shortness of breath, improved Currently 93% on room air Continue incentive spirometry Mild interstitial edema Most recent 2-D echo in 2012,Normal EF with impaired relaxation. Mildly elevated RV pressures of 30 and 40 mmHg. Will repeat 2-D echo Lasix 1, was on lasix at home Cardiac enzymes negative this admission   Acute calculous cholecystitis S/p laparoscopic cholecystectomy 07/02/16, Dr. Stark Klein - Continue pain control as needed - Post op care as per surgery -ready for discharge from general surgery standpoint. She will need to be on PO augmentin x1 week, and follow-up with Dr. Barry Dienes in 3 weeks.  Essential hypertension  - stable - Toprol 25 mg daily  Hyperthyroidism - Methimazole 5 mg daily -TSH 0.45  Nonspecific ST-T wave electrocardiographic changes  -Troponin 3 negative - appears to be early repolarization, no changes from previous EKGs    DVT prophylaxis: Subcutaneous heparin Code Status: Full code Family Communication: None Disposition Plan: in am     Consultants:  Dr.Douglas Ninfa Linden CCS    Procedures/Significant Events:  9/25 Acute calculous cholecystitis and will undergo laparoscopic cholecystectomy.    Cultures 9/25 gallbladder pathology  pending   Antimicrobials: Cefoxitin 9/25>> 9/25 Augmentin 9/26>>   Devices    LINES / TUBES:      Continuous Infusions: . sodium chloride       Objective: Vitals:   07/03/16 0602 07/03/16 1408 07/03/16 2018 07/04/16 0434  BP: 120/81 (!) 145/97 126/61 118/67  Pulse: 71 82 82 (!) 50  Resp: 15   17  Temp: 98.3 F (36.8 C) 99.2 F (37.3 C) 98.5 F (36.9 C) 98.4 F (36.9 C)  TempSrc: Oral Oral Oral Oral  SpO2: 95% 98% 98% 93%  Weight:      Height:        Intake/Output Summary (Last 24 hours) at 07/04/16 0827 Last data filed at 07/03/16 1547  Gross per 24 hour  Intake                0 ml  Output             1400 ml  Net            -1400 ml   Filed Weights   07/01/16 1214 07/01/16 1802  Weight: 94.3 kg (208 lb) 93.9 kg (207 lb)    Examination:  General: A/O 4, appropriate abdominal tenderness, No acute respiratory distress Eyes: negative scleral hemorrhage, negative anisocoria, negative icterus ENT: Negative Runny nose, negative gingival bleeding, Neck:  Negative scars, masses, torticollis, lymphadenopathy, JVD Lungs: Clear to auscultation bilaterally without wheezes or crackles Cardiovascular: Regular rate and rhythm without murmur gallop or rub normal S1 and S2 Abdomen: Positive abdominal pain to palpation, nondistended, positive soft, bowel sounds, no rebound, no ascites, no appreciable mass Extremities: No significant cyanosis, clubbing, or edema bilateral lower extremities Skin: Negative rashes, lesions, ulcers, surgical  incision negative sign of infection Psychiatric:  Negative depression, negative anxiety, negative fatigue, negative mania  Central nervous system:  Cranial nerves II through XII intact, tongue/uvula midline, all extremities muscle strength 5/5, sensation intact throughout, negative dysarthria, negative expressive aphasia, negative receptive aphasia.  .     Data Reviewed: Care during the described time interval was provided by me .  I  have reviewed this patient's available data, including medical history, events of note, physical examination, and all test results as part of my evaluation. I have personally reviewed and interpreted all radiology studies.  CBC:  Recent Labs Lab 07/01/16 1210 07/02/16 0602 07/04/16 0502  WBC 8.1 11.7* 8.6  NEUTROABS 4.6  --   --   HGB 12.1 12.1 9.1*  HCT 34.8* 34.7* 26.1*  MCV 82.3 81.8 82.3  PLT 353 319 [id number]   Basic Metabolic Panel:  Recent Labs Lab 07/01/16 1210 07/02/16 0602 07/04/16 0502  NA 136 135 138  K 3.2* 3.8 3.9  CL 103 102 102  CO2 26 27 27   GLUCOSE 115* 105* 99  BUN 19 9 5*  CREATININE 0.75 0.66 0.63  CALCIUM 8.8* 8.5* 8.2*   GFR: Estimated Creatinine Clearance: 57.6 mL/min (by C-G formula based on SCr of 0.63 mg/dL). Liver Function Tests:  Recent Labs Lab 07/01/16 1210 07/02/16 0602 07/04/16 0502  AST 18 20 37  ALT 14 15 45  ALKPHOS 41 40 36*  BILITOT 0.5 0.8 1.1  PROT 6.7 6.9 5.4*  ALBUMIN 3.4* 3.3* 2.4*    Recent Labs Lab 07/01/16 1210  LIPASE 38   No results for input(s): AMMONIA in the last 168 hours. Coagulation Profile:  Recent Labs Lab 07/01/16 1210  INR 0.99   Cardiac Enzymes:  Recent Labs Lab 07/01/16 1210 07/01/16 1814 07/02/16 0011 07/02/16 0602  TROPONINI <0.03 <0.03 <0.03 <0.03   BNP (last 3 results) No results for input(s): PROBNP in the last 8760 hours. HbA1C: No results for input(s): HGBA1C in the last 72 hours. CBG: No results for input(s): GLUCAP in the last 168 hours. Lipid Profile: No results for input(s): CHOL, HDL, LDLCALC, TRIG, CHOLHDL, LDLDIRECT in the last 72 hours. Thyroid Function Tests:  Recent Labs  07/04/16 0502  TSH 0.454   Anemia Panel: No results for input(s): VITAMINB12, FOLATE, FERRITIN, TIBC, IRON, RETICCTPCT in the last 72 hours. Sepsis Labs: No results for input(s): PROCALCITON, LATICACIDVEN in the last 168 hours.  Recent Results (from the past 240 hour(s))  Surgical pcr  screen     Status: None   Collection Time: 07/02/16  2:17 AM  Result Value Ref Range Status   MRSA, PCR NEGATIVE NEGATIVE Final   Staphylococcus aureus NEGATIVE NEGATIVE Final    Comment:        The Xpert SA Assay (FDA approved for NASAL specimens in patients over 31 years of age), is one component of a comprehensive surveillance program.  Test performance has been validated by [facility] for patients greater than or equal to 21 year old. It is not intended to diagnose infection nor to guide or monitor treatment.          Radiology Studies: Dg Chest Port 1 View  Result Date: 07/03/2016 CLINICAL DATA:  Shortness of breath, history of bronchiectasis and hypertension. Nonsmoker. EXAM: PORTABLE CHEST 1 VIEW COMPARISON:  Chest x-ray of July 01, 2016 FINDINGS: The lungs remain hypoinflated. The pulmonary vascular congestion has improved slightly. There remain increased densities at both lung bases. Small amounts of pleural fluid are likely  present. Surgical clips in the right hilar region are stable. The cardiac silhouette is enlarged. The pulmonary vascularity is less prominent. The observed bony thorax exhibits no acute abnormality. There is mild gaseous distention of bowel in the upper abdomen. IMPRESSION: Persistent hypo inflation. Slight improvement in pulmonary interstitial edema. Persistent bibasilar atelectasis. Electronically Signed   By: David  Martinique M.D.   On: 07/03/2016 15:10        Scheduled Meds: . amoxicillin-clavulanate  1 tablet Oral Q12H  . famotidine  20 mg Oral BID  . methimazole  5 mg Oral Daily  . metoprolol succinate  25 mg Oral Daily   Continuous Infusions: . sodium chloride       LOS: 0 days    Time spent:40 min    Reyne Dumas, MD [facility] Pager [phone]  If 7PM-7AM, please contact night-coverage [email or web address] Password [facility] 07/04/2016, 8:27 AM

## 2016-07-05 ENCOUNTER — Inpatient Hospital Stay (HOSPITAL_COMMUNITY): Payer: Medicare Other

## 2016-07-05 DIAGNOSIS — R0600 Dyspnea, unspecified: Secondary | ICD-10-CM

## 2016-07-05 LAB — ECHOCARDIOGRAM COMPLETE
AO mean calculated velocity dopler: 148 cm/s
AV Area VTI index: 0.68 cm2/m2
AV Area VTI: 1.46 cm2
AV Area mean vel: 1.37 cm2
AV Mean grad: 10 mmHg
AV Peak grad: 19 mmHg
AV VEL mean LVOT/AV: 0.68
AV area mean vel ind: 0.67 cm2/m2
AV peak Index: 0.71
AV pk vel: 217 cm/s
AV vel: 1.41
Ao pk vel: 0.73 m/s
E decel time: 275 msec
E/e' ratio: 12.75
FS: 50 % — AB (ref 28–44)
Height: 61 in
IVS/LV PW RATIO, ED: 0.89
LA ID, A-P, ES: 38 mm
LA diam end sys: 38 mm
LA diam index: 1.84 cm/m2
LA vol A4C: 57.3 ml
LA vol index: 24.3 mL/m2
LA vol: 50 mL
LV E/e' medial: 12.75
LV E/e'average: 12.75
LV PW d: 9.64 mm — AB (ref 0.6–1.1)
LV e' LATERAL: 8.16 cm/s
LVOT SV: 70 mL
LVOT VTI: 34.7 cm
LVOT area: 2.01 cm2
LVOT diameter: 16 mm
LVOT peak VTI: 0.7 cm
LVOT peak grad rest: 10 mmHg
LVOT peak vel: 158 cm/s
Lateral S' vel: 13.7 cm/s
MV Dec: 275
MV Peak grad: 4 mmHg
MV pk A vel: 125 m/s
MV pk E vel: 104 m/s
RV sys press: 46 mmHg
Reg peak vel: 326 cm/s
TAPSE: 23.9 mm
TDI e' lateral: 8.16
TDI e' medial: 7.07
TR max vel: 326 cm/s
VTI: 49.4 cm
Valve area index: 0.68
Valve area: 1.41 cm2
Weight: 3312 oz

## 2016-07-05 LAB — COMPREHENSIVE METABOLIC PANEL
ALT: 41 U/L (ref 14–54)
AST: 30 U/L (ref 15–41)
Albumin: 2.6 g/dL — ABNORMAL LOW (ref 3.5–5.0)
Alkaline Phosphatase: 42 U/L (ref 38–126)
Anion gap: 5 (ref 5–15)
BUN: 7 mg/dL (ref 6–20)
CO2: 31 mmol/L (ref 22–32)
Calcium: 8.7 mg/dL — ABNORMAL LOW (ref 8.9–10.3)
Chloride: 103 mmol/L (ref 101–111)
Creatinine, Ser: 0.66 mg/dL (ref 0.44–1.00)
GFR calc Af Amer: >60 mL/min (ref >60)
GFR calc non Af Amer: >60 mL/min (ref >60)
Glucose, Bld: 105 mg/dL — ABNORMAL HIGH (ref 65–99)
Potassium: 3.6 mmol/L (ref 3.5–5.1)
Sodium: 139 mmol/L (ref 135–145)
Total Bilirubin: 0.8 mg/dL (ref 0.3–1.2)
Total Protein: 6.4 g/dL — ABNORMAL LOW (ref 6.5–8.1)

## 2016-07-05 LAB — CBC
HCT: 29.3 % — ABNORMAL LOW (ref 36.0–46.0)
Hemoglobin: 10.1 g/dL — ABNORMAL LOW (ref 12.0–15.0)
MCH: 28.4 pg (ref 26.0–34.0)
MCHC: 34.5 g/dL (ref 30.0–36.0)
MCV: 82.3 fL (ref 78.0–100.0)
Platelets: 298 10*3/uL (ref 150–400)
RBC: 3.56 MIL/uL — ABNORMAL LOW (ref 3.87–5.11)
RDW: 14.3 % (ref 11.5–15.5)
WBC: 6.2 10*3/uL (ref 4.0–10.5)

## 2016-07-05 MED ORDER — FENOFIBRATE 145 MG PO TABS: 145.0000 mg | ORAL_TABLET | Freq: Every day | ORAL | 0 refills | Status: DC

## 2016-07-05 MED ORDER — AMOXICILLIN-POT CLAVULANATE 875-125 MG PO TABS: 1.0000 | ORAL_TABLET | Freq: Two times a day (BID) | ORAL | 0 refills | Status: AC

## 2016-07-05 MED ORDER — AMOXICILLIN-POT CLAVULANATE 875-125 MG PO TABS: 1.0000 | ORAL_TABLET | Freq: Two times a day (BID) | ORAL | 0 refills | Status: DC

## 2016-07-05 MED ORDER — OXYCODONE-ACETAMINOPHEN 5-325 MG PO TABS: 1.0000 | ORAL_TABLET | ORAL | 0 refills | Status: DC | PRN

## 2016-07-05 NOTE — Progress Notes (Signed)
  Echocardiogram 2D Echocardiogram has been performed.  [name] 07/05/2016, 10:38 AM

## 2016-07-05 NOTE — Discharge Summary (Addendum)
Physician Discharge Summary  [name] MRN: [id number] DOB/AGE: Feb 21, 1933 82 y.o.  PCP: Maximino Greenland, MD   Admit date: 07/01/2016 Discharge date: 07/05/2016  Discharge Diagnoses:    Principal Problem:   Symptomatic cholelithiasis Active Problems:   Essential hypertension   Hyperthyroidism   Nonspecific ST-T wave electrocardiographic changes   Abdominal pain   Cough        Follow-up recommendations Follow-up with PCP in 3-5 days , including all  additional recommended appointments as below Follow-up CBC, CMP in 3-5 days Follow up with Dr Barry Dienes in 2-3 weeks  PCP to please follow up on results of echo       Current Discharge Medication List    START taking these medications   Details  amoxicillin-clavulanate (AUGMENTIN) 875-125 MG tablet Take 1 tablet by mouth every 12 (twelve) hours. Qty: 14 tablet, Refills: 0      CONTINUE these medications which have CHANGED   Details  fenofibrate (TRICOR) 145 MG tablet Take 1 tablet (145 mg total) by mouth daily. Qty: 30 tablet, Refills: 0    oxyCODONE-acetaminophen (PERCOCET/ROXICET) 5-325 MG tablet Take 1 tablet by mouth every 4 (four) hours as needed for severe pain. Qty: 12 tablet, Refills: 0      CONTINUE these medications which have NOT CHANGED   Details  acetaminophen (TYLENOL) 650 MG CR tablet Take 1,300 mg by mouth every 8 (eight) hours as needed for pain.     aspirin EC 81 MG tablet Take 81 mg by mouth at bedtime.    CALCIUM-MAGNESUIUM-ZINC 333-133-8.3 MG TABS Take 1 tablet by mouth daily.    cholecalciferol (VITAMIN D) 1000 UNITS tablet Take 1,000 Units by mouth daily.    Flaxseed, Linseed, (FLAX SEED OIL PO) Take 1 capsule by mouth daily.     furosemide (LASIX) 20 MG tablet Take 20 mg by mouth daily.      lidocaine (LIDODERM) 5 % Place 1 patch onto the skin daily as needed (hip pain).     methimazole (TAPAZOLE) 5 MG tablet Take 5 mg by mouth daily.    metoprolol succinate (TOPROL-XL) 25 MG 24 hr  tablet Take 25 mg by mouth daily.    nystatin cream (MYCOSTATIN) Apply 1 application topically daily. Apply under breasts and on stomach folds    pantoprazole (PROTONIX) 40 MG tablet Take 1 tablet (40 mg total) by mouth daily. Qty: 30 tablet, Refills: 1    potassium chloride SA (K-DUR,KLOR-CON) 20 MEQ tablet Take 20 mEq by mouth daily.     vitamin C (ASCORBIC ACID) 500 MG tablet Take 500 mg by mouth daily.         Discharge Condition: stable  Discharge Instructions Get Medicines reviewed and adjusted: Please take all your medications with you for your next visit with your Primary MD  Please request your Primary MD to go over all hospital tests and procedure/radiological results at the follow up, please ask your Primary MD to get all Hospital records sent to his/her office.  If you experience worsening of your admission symptoms, develop shortness of breath, life threatening emergency, suicidal or homicidal thoughts you must seek medical attention immediately by calling 911 or calling your MD immediately if symptoms less severe.  You must read complete instructions/literature along with all the possible adverse reactions/side effects for all the Medicines you take and that have been prescribed to you. Take any new Medicines after you have completely understood and accpet all the possible adverse reactions/side effects.   Do not drive when  taking Pain medications.   Do not take more than prescribed Pain, Sleep and Anxiety Medications  Special Instructions: If you have smoked or chewed Tobacco in the last 2 yrs please stop smoking, stop any regular Alcohol and or any Recreational drug use.  Wear Seat belts while driving.  Please note  You were cared for by a hospitalist during your hospital stay. Once you are discharged, your primary care physician will handle any further medical issues. Please note that NO REFILLS for any discharge medications will be authorized once you are  discharged, as it is imperative that you return to your primary care physician (or establish a relationship with a primary care physician if you do not have one) for your aftercare needs so that they can reassess your need for medications and monitor your lab values.  Discharge Instructions    Diet - low sodium heart healthy    Complete by:  As directed    Increase activity slowly    Complete by:  As directed        Allergies  Allergen Reactions  . Ibuprofen Other (See Comments)    hallucinations  . Naproxen Sodium Other (See Comments)    hallucinations      Disposition: 01-Home or Self Care   Consults: * surgery    Significant Diagnostic Studies:  Dg Chest Port 1 View  Result Date: 07/03/2016 CLINICAL DATA:  Shortness of breath, history of bronchiectasis and hypertension. Nonsmoker. EXAM: PORTABLE CHEST 1 VIEW COMPARISON:  Chest x-ray of July 01, 2016 FINDINGS: The lungs remain hypoinflated. The pulmonary vascular congestion has improved slightly. There remain increased densities at both lung bases. Small amounts of pleural fluid are likely present. Surgical clips in the right hilar region are stable. The cardiac silhouette is enlarged. The pulmonary vascularity is less prominent. The observed bony thorax exhibits no acute abnormality. There is mild gaseous distention of bowel in the upper abdomen. IMPRESSION: Persistent hypo inflation. Slight improvement in pulmonary interstitial edema. Persistent bibasilar atelectasis. Electronically Signed   By: David  Martinique M.D.   On: 07/03/2016 15:10   Dg Chest Port 1 View  Result Date: 07/01/2016 CLINICAL DATA:  Posttraumatic pain after trauma. 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 EXAM: PORTABLE CHEST 1 VIEW COMPARISON:  12/30/2008 chest CT, 06/28/2010 chest radiograph FINDINGS: Lung volumes are low with bibasilar atelectasis and crowding of interstitial lung markings. No pneumonic  consolidations. There is mild vascular congestion. Heart is normal in size. No aortic aneurysm. No acute osseous abnormality. Surgical clips are seen about the right infrahilar portion of the chest. IMPRESSION: Low lung volumes with mild pulmonary vascular congestion. Stable postsurgical changes about the right hilum. Electronically Signed   By: Ashley Royalty M.D.   On: 07/01/2016 12:57   Ct Angio Chest/abd/pel For Dissection W And/or W/wo  Result Date: 07/01/2016 CLINICAL DATA:  Pt having severe mid abd epigastric pain that started this AM. EXAM: CT ANGIOGRAPHY CHEST, ABDOMEN AND PELVIS TECHNIQUE: Multidetector CT imaging through the chest, abdomen and pelvis was performed using the standard protocol during bolus administration of intravenous contrast. Multiplanar reconstructed images and MIPs were obtained and reviewed to evaluate the vascular anatomy. CONTRAST:  100 ml iso 370. COMPARISON:  None. FINDINGS: CTA CHEST FINDINGS Cardiovascular: Left arm IV contrast administration. Innominate vein and SVC patent. Dilated central pulmonary arteries. Satisfactory opacification of pulmonary arteries noted, and there is no evidence of pulmonary emboli. Patent bilateral pulmonary veins drain into left atrium. Adequate contrast  opacification of the thoracic aorta with no evidence of dissection, aneurysm, or stenosis. There is bovine variant brachiocephalic arch anatomy without proximal stenosis. Tortuous right brachiocephalic and proximal left subclavian arteries. Minimal scattered calcified plaque in the aortic arch and descending thoracic segment. Mediastinum/Nodes: Enlarged heterogeneous thyroid with left lesion at least 2.6 cm. No hilar or mediastinal adenopathy. Calcified bilateral hilar lymph nodes. No pericardial effusion. Lungs/Pleura: Dependent atelectasis posteriorly in both lungs. Postop changes at the right hilum. No confluent airspace disease. Calcified granuloma in the superior segment left lower lobe.  Musculoskeletal: Mild spondylitic changes throughout the thoracic spine. Negative for fracture. Review of the MIP images confirms the above findings. CTA ABDOMEN AND PELVIS FINDINGS VASCULAR Aorta: Scattered calcified plaque. No aneurysm, dissection, or stenosis. Celiac: Patent without evidence of aneurysm, dissection, vasculitis or significant stenosis. SMA: Patent without evidence of aneurysm, dissection, vasculitis or significant stenosis. Renals: Both renal arteries are patent without evidence of aneurysm, dissection, vasculitis, fibromuscular dysplasia or significant stenosis. IMA: Patent without evidence of aneurysm, dissection, vasculitis or significant stenosis. Inflow: Scattered calcified plaque through bilateral common iliac arteries and internal iliac arteries. No aneurysm, dissection, or stenosis. Veins: No obvious venous abnormality within the limitations of this arterial phase study. Review of the MIP images confirms the above findings. NON-VASCULAR Hepatobiliary: 16 mm enhancing subcapsular lesion in the left lobe image 117/501, stable since scans from 08/19/2006, presumably benign lesion such as FNH. No new liver lesion. Partially calcified stones in the dependent aspect of the nondilated gallbladder. No intra or extrahepatic biliary ductal dilatation. Pancreas: Unremarkable Spleen: Unremarkable arterial phase Adrenals/Urinary Tract: Normal adrenals. No hydronephrosis. No renal mass. Urinary bladder nondistended. Stomach/Bowel: Stomach and small bowel nondilated. Normal appendix. Colonic diverticula most numerous in the sigmoid segment, without significant adjacent inflammatory/ edematous change. Lymphatic: No adenopathy localized. Musculoskeletal: Mild lumbar dextroscoliosis with multilevel degenerative changes. Bilateral hip osteoarthritis. Negative for fracture. Other: No ascites.  No free air. Review of the MIP images confirms the above findings. IMPRESSION: 1. Negative for acute PE or aortic  dissection. 2. Dilated central pulmonary arteries suggesting pulmonary hypertension. 3. Cholelithiasis 4. Sigmoid diverticulosis 5. Thyromegaly with nodules Electronically Signed   By: Lucrezia Europe M.D.   On: 07/01/2016 14:48   US Abdomen Limited Ruq  Result Date: 07/01/2016 CLINICAL DATA:  Upper abdominal pain EXAM: US ABDOMEN LIMITED - RIGHT UPPER QUADRANT COMPARISON:  CT abdomen and pelvis Feb 28, 2015 ; CT angiogram chest including portions of the liver July 01, 2016 FINDINGS: Gallbladder: There is a 1.5 cm calculus adherent in the neck of the gallbladder. There is no gallbladder wall thickening or pericholecystic fluid. There is slight sludge in the gallbladder. No sonographic Murphy sign noted by sonographer. Common bile duct: Diameter: 5 mm. There is no intrahepatic or extrahepatic biliary duct dilatation. Liver: No focal lesion identified. Liver echogenicity is somewhat increased. IMPRESSION: There is a 1.5 cm in length calculus adherent in the neck of the gallbladder. Mild sludge is noted in the gallbladder. No gallbladder wall thickening or pericholecystic fluid. Increased liver echogenicity is present, a finding most likely indicative of a degree of hepatic steatosis. No focal liver lesions are evident by ultrasound. Note that no lesion is identified near the junction of the right and left lobes as was noted on CT examination performed earlier in the day. It must be cautioned that the sensitivity of ultrasound for focal liver lesions is diminished in this circumstance. Electronically Signed   By: Lowella Grip III M.D.   On: 07/01/2016  15:36    Echocardiogram-results pending        Filed Weights   07/01/16 1214 07/01/16 1802  Weight: 94.3 kg (208 lb) 93.9 kg (207 lb)     Microbiology: Recent Results (from the past 240 hour(s))  Surgical pcr screen     Status: None   Collection Time: 07/02/16  2:17 AM  Result Value Ref Range Status   MRSA, PCR NEGATIVE NEGATIVE Final    Staphylococcus aureus NEGATIVE NEGATIVE Final    Comment:        The Xpert SA Assay (FDA approved for NASAL specimens in patients over 21 years of age), is one component of a comprehensive surveillance program.  Test performance has been validated by [facility] for patients greater than or equal to 25 year old. It is not intended to diagnose infection nor to guide or monitor treatment.        Blood Culture No results found for: SDES, SPECREQUEST, CULT, REPTSTATUS    Labs: Results for orders placed or performed during the hospital encounter of 07/01/16 (from the past 48 hour(s))  CBC     Status: Abnormal   Collection Time: 07/04/16  5:02 AM  Result Value Ref Range   WBC 8.6 4.0 - 10.5 K/uL   RBC 3.17 (L) 3.87 - 5.11 MIL/uL   Hemoglobin 9.1 (L) 12.0 - 15.0 g/dL    Comment: SPECIMEN CHECKED FOR CLOTS REPEATED TO VERIFY    HCT 26.1 (L) 36.0 - 46.0 %   MCV 82.3 78.0 - 100.0 fL   MCH 28.7 26.0 - 34.0 pg   MCHC 34.9 30.0 - 36.0 g/dL   RDW 14.5 11.5 - 15.5 %   Platelets 241 150 - 400 K/uL  Comprehensive metabolic panel     Status: Abnormal   Collection Time: 07/04/16  5:02 AM  Result Value Ref Range   Sodium 138 135 - 145 mmol/L   Potassium 3.9 3.5 - 5.1 mmol/L   Chloride 102 101 - 111 mmol/L   CO2 27 22 - 32 mmol/L   Glucose, Bld 99 65 - 99 mg/dL   BUN 5 (L) 6 - 20 mg/dL   Creatinine, Ser 0.63 0.44 - 1.00 mg/dL   Calcium 8.2 (L) 8.9 - 10.3 mg/dL   Total Protein 5.4 (L) 6.5 - 8.1 g/dL   Albumin 2.4 (L) 3.5 - 5.0 g/dL   AST 37 15 - 41 U/L   ALT 45 14 - 54 U/L   Alkaline Phosphatase 36 (L) 38 - 126 U/L   Total Bilirubin 1.1 0.3 - 1.2 mg/dL   GFR calc non Af Amer >60 >60 mL/min   GFR calc Af Amer >60 >60 mL/min    Comment: (NOTE) The eGFR has been calculated using the CKD EPI equation. This calculation has not been validated in all clinical situations. eGFR's persistently <60 mL/min signify possible Chronic Kidney Disease.    Anion gap 9 5 - 15  TSH     Status:  None   Collection Time: 07/04/16  5:02 AM  Result Value Ref Range   TSH 0.454 0.350 - 4.500 uIU/mL  CBC     Status: Abnormal   Collection Time: 07/05/16  5:53 AM  Result Value Ref Range   WBC 6.2 4.0 - 10.5 K/uL   RBC 3.56 (L) 3.87 - 5.11 MIL/uL   Hemoglobin 10.1 (L) 12.0 - 15.0 g/dL   HCT 29.3 (L) 36.0 - 46.0 %   MCV 82.3 78.0 - 100.0 fL   MCH 28.4  26.0 - 34.0 pg   MCHC 34.5 30.0 - 36.0 g/dL   RDW 14.3 11.5 - 15.5 %   Platelets 298 150 - 400 K/uL  Comprehensive metabolic panel     Status: Abnormal   Collection Time: 07/05/16  5:53 AM  Result Value Ref Range   Sodium 139 135 - 145 mmol/L   Potassium 3.6 3.5 - 5.1 mmol/L   Chloride 103 101 - 111 mmol/L   CO2 31 22 - 32 mmol/L   Glucose, Bld 105 (H) 65 - 99 mg/dL   BUN 7 6 - 20 mg/dL   Creatinine, Ser 0.66 0.44 - 1.00 mg/dL   Calcium 8.7 (L) 8.9 - 10.3 mg/dL   Total Protein 6.4 (L) 6.5 - 8.1 g/dL   Albumin 2.6 (L) 3.5 - 5.0 g/dL   AST 30 15 - 41 U/L   ALT 41 14 - 54 U/L   Alkaline Phosphatase 42 38 - 126 U/L   Total Bilirubin 0.8 0.3 - 1.2 mg/dL   GFR calc non Af Amer >60 >60 mL/min   GFR calc Af Amer >60 >60 mL/min    Comment: (NOTE) The eGFR has been calculated using the CKD EPI equation. This calculation has not been validated in all clinical situations. eGFR's persistently <60 mL/min signify possible Chronic Kidney Disease.    Anion gap 5 5 - 15     Lipid Panel  No results found for: CHOL, TRIG, HDL, CHOLHDL, VLDL, LDLCALC, LDLDIRECT   No results found for: HGBA1C      HPI   81 y.o. female with a medical history significant for, but not  limited to, hypothyroidism, HTN, and OSA.  Patient brought to ED by EMS this morning for evaluation of nonradiating epigastric pain and vomiting. Started acutely this morning while at church. Patient consumed fatty foods yesterday which she does not typically do. The epigastric pain was severe, nonradiating. Pain has persisted despite IV narcotics in the emergency department.  Pain is constant constant She had similar pain about a year ago, told it was likely acid reflux and pain eventually resolved with dietary modifications. Prior to this morning patient was in her usual state of health. She lives alone, cooks for herself, and ambulates without any assistance.   ED Course:  Afebrile, heart rate in the fifties, normotensive, oxygen saturation 93% on room air Potassium 3.2, troponin less than 0.03, normal renal function. Normal LFTs and normal lipase . CBC normal. Chest x-ray shows low lung volumes with mild pulmonary vascular congestion EKG compatible with ST elevation, consider inferior injury. A code STEMI was called but later canceled CT chest abdomen and pelvis for dissection was negative for PE or dissection.Surgery consulted by ED for cholelithiasis in neck of gall bladder, no evidence of cholecystitis.  Rx: Patient has received 2 doses of fentanyl and just received 4 mg of IV morphine. Her pain is still rated as 10/10.  HOSPITAL COURSE   Shortness of breath, improved Currently 93% on room air Continue incentive spirometry Mild interstitial edema Most recent 2-D echo in 2012,Normal EF with impaired relaxation. Mildly elevated RV pressures of 30 and 40 mmHg. Will repeat 2-D echo, done but results pending  Lasix 1, was on lasix at home, resumed  Cardiac enzymes negative this admission   Acute calculous cholecystitis S/p laparoscopic cholecystectomy 07/02/16, Dr. Stark Klein - Continue pain control as needed - Post op care as per surgery -ready for discharge from general surgery standpoint. She will need to be on PO augmentin  x1 week, and follow-up with Dr. Barry Dienes in 3weeks.  Essential hypertension - stable - Toprol 25 mg daily  Hyperthyroidism - Methimazole 5 mg daily -TSH 0.45  Nonspecific ST-T wave electrocardiographic changes  -Troponin 3 negative - appears to be early repolarization, no changes from previous EKGs    Discharge  Exam:   Blood pressure 126/70, pulse 61, temperature 98.4 F (36.9 C), resp. rate 20, height 5' 1"  (1.549 m), weight 93.9 kg (207 lb), SpO2 93 %.   General: A/O 4, appropriate abdominal tenderness, No acute respiratory distress Eyes: negative scleral hemorrhage, negative anisocoria, negative icterus ENT: Negative Runny nose, negative gingival bleeding, Neck:  Negative scars, masses, torticollis, lymphadenopathy, JVD Lungs: Clear to auscultation bilaterally without wheezes or crackles Cardiovascular: Regular rate and rhythm without murmur gallop or rub normal S1 and S2 Abdomen: Positive abdominal pain to palpation, nondistended, positive soft, bowel sounds, no rebound, no ascites, no appreciable mass Extremities: No significant cyanosis, clubbing, or edema bilateral lower extremities   Follow-up Information    BYERLY,FAERA, MD. Go on 07/04/2016.   Specialty:  General Surgery Why:  Your appointment is 07/23/2016 at 11:15am. Please arrive 30 minutes prior to your appointment to fill out necessary paperwork. Contact information: [address] [place] [address] [phone]        Maximino Greenland, MD. Schedule an appointment as soon as possible for a visit in 2 day(s).   Specialty:  Internal Medicine Why:  hospital follow up Contact information: [address] [place] [address] [phone]           Signed: Reyne Dumas 07/05/2016, 10:35 AM        Time spent >45 mins

## 2016-07-05 NOTE — Progress Notes (Signed)
SATURATION QUALIFICATIONS: (This note is used to comply with regulatory documentation for home oxygen)  Patient Saturations on Room Air at Rest = 100%  Patient Saturations on Room Air while Ambulating = 98%  Patient Saturations on 0 Liters of oxygen while Ambulating = N/A%  Please briefly explain why patient needs home oxygen:  Pt ambulated in hall on room air and was able to maintain O2 saturation at 98%.  Pt was not labored breathing and did not have any complaints.  Pt was not ambulated with oxygen.

## 2016-07-05 NOTE — Progress Notes (Signed)
Pt discharged to home.  Discharge instructions explained to pt.  Pt has no questions at the time of discharge.  IV removed. Pt states she has all belongings.  Pt taken off unit via wheelchair by staff.

## 2016-07-10 ENCOUNTER — Ambulatory Visit (INDEPENDENT_AMBULATORY_CARE_PROVIDER_SITE_OTHER): Payer: Self-pay | Admitting: Orthopaedic Surgery

## 2016-07-16 DIAGNOSIS — Z09 Encounter for follow-up examination after completed treatment for conditions other than malignant neoplasm: Secondary | ICD-10-CM | POA: Diagnosis not present

## 2016-07-16 DIAGNOSIS — K8018 Calculus of gallbladder with other cholecystitis without obstruction: Secondary | ICD-10-CM | POA: Diagnosis not present

## 2016-07-16 DIAGNOSIS — Z9049 Acquired absence of other specified parts of digestive tract: Secondary | ICD-10-CM | POA: Diagnosis not present

## 2016-08-14 DIAGNOSIS — Z1231 Encounter for screening mammogram for malignant neoplasm of breast: Secondary | ICD-10-CM | POA: Diagnosis not present

## 2016-10-15 DIAGNOSIS — J321 Chronic frontal sinusitis: Secondary | ICD-10-CM | POA: Diagnosis not present

## 2016-10-29 DIAGNOSIS — R1013 Epigastric pain: Secondary | ICD-10-CM | POA: Diagnosis not present

## 2016-10-29 DIAGNOSIS — K8000 Calculus of gallbladder with acute cholecystitis without obstruction: Secondary | ICD-10-CM | POA: Diagnosis not present

## 2016-10-31 DIAGNOSIS — H40033 Anatomical narrow angle, bilateral: Secondary | ICD-10-CM | POA: Diagnosis not present

## 2016-10-31 DIAGNOSIS — H40013 Open angle with borderline findings, low risk, bilateral: Secondary | ICD-10-CM | POA: Diagnosis not present

## 2016-11-08 DIAGNOSIS — E0590 Thyrotoxicosis, unspecified without thyrotoxic crisis or storm: Secondary | ICD-10-CM | POA: Diagnosis not present

## 2016-11-15 DIAGNOSIS — I10 Essential (primary) hypertension: Secondary | ICD-10-CM | POA: Diagnosis not present

## 2016-11-15 DIAGNOSIS — R1013 Epigastric pain: Secondary | ICD-10-CM | POA: Diagnosis not present

## 2016-11-15 DIAGNOSIS — K219 Gastro-esophageal reflux disease without esophagitis: Secondary | ICD-10-CM | POA: Diagnosis not present

## 2016-11-22 DIAGNOSIS — K319 Disease of stomach and duodenum, unspecified: Secondary | ICD-10-CM | POA: Diagnosis not present

## 2016-11-22 DIAGNOSIS — K449 Diaphragmatic hernia without obstruction or gangrene: Secondary | ICD-10-CM | POA: Diagnosis not present

## 2016-11-22 DIAGNOSIS — K3189 Other diseases of stomach and duodenum: Secondary | ICD-10-CM | POA: Diagnosis not present

## 2016-11-22 DIAGNOSIS — K2970 Gastritis, unspecified, without bleeding: Secondary | ICD-10-CM | POA: Diagnosis not present

## 2016-11-22 DIAGNOSIS — R1013 Epigastric pain: Secondary | ICD-10-CM | POA: Diagnosis not present

## 2016-11-28 DIAGNOSIS — N182 Chronic kidney disease, stage 2 (mild): Secondary | ICD-10-CM | POA: Diagnosis not present

## 2016-11-28 DIAGNOSIS — I129 Hypertensive chronic kidney disease with stage 1 through stage 4 chronic kidney disease, or unspecified chronic kidney disease: Secondary | ICD-10-CM | POA: Diagnosis not present

## 2016-11-28 DIAGNOSIS — Z6839 Body mass index (BMI) 39.0-39.9, adult: Secondary | ICD-10-CM | POA: Diagnosis not present

## 2016-11-28 DIAGNOSIS — M549 Dorsalgia, unspecified: Secondary | ICD-10-CM | POA: Diagnosis not present

## 2016-12-26 DIAGNOSIS — K219 Gastro-esophageal reflux disease without esophagitis: Secondary | ICD-10-CM | POA: Diagnosis not present

## 2016-12-26 DIAGNOSIS — R1013 Epigastric pain: Secondary | ICD-10-CM | POA: Diagnosis not present

## 2017-01-01 ENCOUNTER — Ambulatory Visit (INDEPENDENT_AMBULATORY_CARE_PROVIDER_SITE_OTHER): Payer: Self-pay | Admitting: Orthopaedic Surgery

## 2017-01-11 ENCOUNTER — Ambulatory Visit (INDEPENDENT_AMBULATORY_CARE_PROVIDER_SITE_OTHER): Payer: Medicare Other | Admitting: Orthopaedic Surgery

## 2017-01-11 ENCOUNTER — Encounter (INDEPENDENT_AMBULATORY_CARE_PROVIDER_SITE_OTHER): Payer: Self-pay | Admitting: Orthopaedic Surgery

## 2017-01-11 VITALS — BP 123/79 | HR 59 | Ht 61.0 in | Wt 200.0 lb

## 2017-01-11 DIAGNOSIS — G8929 Other chronic pain: Secondary | ICD-10-CM | POA: Diagnosis not present

## 2017-01-11 DIAGNOSIS — M545 Low back pain: Secondary | ICD-10-CM | POA: Diagnosis not present

## 2017-01-13 NOTE — Progress Notes (Signed)
Office Visit Note   Patient: [name]           Date of Birth: 12-23-1932           MRN: [id number] Visit Date: 01/11/2017              Requested by: Glendale Chard, MD [address] [place], [place] [address] PCP: Maximino Greenland, MD   Assessment & Plan: Visit Diagnoses:  1. Chronic bilateral low back pain, with sciatica presence unspecified     Plan: Currently her symptoms are not severe enough to consider diagnostic imaging. We'll recheck her again in 4 months.  if she develops increased pain or weakness she will notify us. We reviewed the CT scan that she had of the abdomen that shows some facet arthropathy and spurring. Recheck 4 months.  Follow-Up Instructions: Return in about 4 months (around 05/13/2017).   Orders:  No orders of the defined types were placed in this encounter.  No orders of the defined types were placed in this encounter.     Procedures: No procedures performed   Clinical Data: No additional findings.   Subjective: Chief Complaint  Patient presents with  . Lower Back - Pain    HPI patient continues to have low back pain that radiates into her legs. History of present for years she's had previous injections by Dr. Ernestina Patches 06/20/2016. She's not had to take any pain medication. She has pain if she stands for. Time or she walks more than the 10-15 minutes. Does not limit her from going places she wants to go. She had recent gallbladder surgery in the fall 2017.  Review of Systems  Constitutional: Negative for chills and diaphoresis.  HENT: Negative for ear discharge, ear pain and nosebleeds.   Eyes: Negative for discharge and visual disturbance.  Respiratory: Negative for cough, choking and shortness of breath.   Cardiovascular: Positive for leg swelling. Negative for chest pain and palpitations.  Gastrointestinal: Negative for abdominal distention and abdominal pain.       Positive for gallbladder surgery 2017  Endocrine: Negative  for cold intolerance and heat intolerance.  Genitourinary: Negative for flank pain and hematuria.  Musculoskeletal: Positive for back pain.  Skin: Negative for rash and wound.  Neurological: Negative for seizures and speech difficulty.  Hematological: Negative for adenopathy. Does not bruise/bleed easily.  Psychiatric/Behavioral: Negative for agitation and suicidal ideas.     Objective: Vital Signs: BP 123/79   Pulse (!) 59   Ht 5\' 1"  (1.549 m)   Wt 200 lb (90.7 kg)   BMI 37.79 kg/m   Physical Exam  Constitutional: She is oriented to person, place, and time. She appears well-developed.  HENT:  Head: Normocephalic.  Right Ear: External ear normal.  Left Ear: External ear normal.  Eyes: Pupils are equal, round, and reactive to light.  Neck: No tracheal deviation present. No thyromegaly present.  Cardiovascular: Normal rate.   Pulmonary/Chest: Effort normal.  Abdominal: Soft.  Musculoskeletal:  Patient instructed to standing. His heel-to-toe gait. She has some sciatic notch tenderness pain with palpation lumbosacral junction. Trochanters are tender both right and left symmetrically. Negative straight leg raising 90. She has some knee crepitus with extension and no pain with internal rotations of her hip no hip flexion contracture. Lower extremity edema.  Neurological: She is alert and oriented to person, place, and time.  Skin: Skin is warm and dry.  Psychiatric: She has a normal mood and affect. Her behavior is normal.  Ortho Exam  Specialty Comments:  No specialty comments available.  Imaging: No results found.   PMFS History: Patient Active Problem List   Diagnosis Date Noted  . Cough   . Dissection of aorta ([place])   . Abdominal pain   . Symptomatic cholelithiasis 07/01/2016  . Nonspecific ST-T wave electrocardiographic changes   . Lower leg edema 02/16/2015  . Obesity (BMI 30.0-34.9) 02/16/2015  . Musculoskeletal chest pain 12/16/2013  . Osteoporosis  01/11/2012  . Essential hypertension 01/11/2012  . Hyperthyroidism 01/11/2012  . Hypercholesteremia 01/11/2012  . H/O vaginal hysterectomy 1974 01/11/2012    Class: History of  . S/P removal of lung  partial R lobe  2010 01/11/2012  . History of hernia repair  R ing. 1975 01/11/2012  . BACK PAIN 11/12/2007  . DYSPNEA 11/11/2007   Past Medical History:  Diagnosis Date  . Bronchiectasis    isolated to RML; status post right middle lobe partial resection.  . H/O osteoporosis   . Hypertension   . Hypothyroidism   . OSA (obstructive sleep apnea)   . Yeast infection     Family History  Problem Relation Age of Onset  . Hypertension Mother   . CVA Mother 15  . Heart attack Sister   . Diabetes Sister   . Heart attack Brother   . Heart attack Brother     Past Surgical History:  Procedure Laterality Date  . ABDOMINAL HYSTERECTOMY  1974  . CHOLECYSTECTOMY N/A 07/02/2016   Procedure: LAPAROSCOPIC CHOLECYSTECTOMY;  Surgeon: Stark Klein, MD;  Location: [facility];  Service: General;  Laterality: N/A;  . [place]  . LUNG REMOVAL, PARTIAL  208   RML  . NM MYOVIEW LTD  September 2012   Normal EF. No ischemia or infarction.  Domingo Dimes ECHOCARDIOGRAM  September 2012   Mild concentric LVH. Normal EF with impaired relaxation. Mildly elevated RV pressures of 30 and 40 mmHg.   Social History   Occupational History  . Not on file.   Social History Main Topics  . Smoking status: Never Smoker  . Smokeless tobacco: Never Used  . Alcohol use No  . Drug use: No  . Sexual activity: No

## 2017-03-07 ENCOUNTER — Telehealth (INDEPENDENT_AMBULATORY_CARE_PROVIDER_SITE_OTHER): Payer: Self-pay | Admitting: Orthopaedic Surgery

## 2017-03-07 ENCOUNTER — Ambulatory Visit (INDEPENDENT_AMBULATORY_CARE_PROVIDER_SITE_OTHER): Payer: Medicare Other

## 2017-03-07 ENCOUNTER — Encounter (INDEPENDENT_AMBULATORY_CARE_PROVIDER_SITE_OTHER): Payer: Self-pay | Admitting: Orthopaedic Surgery

## 2017-03-07 ENCOUNTER — Ambulatory Visit (INDEPENDENT_AMBULATORY_CARE_PROVIDER_SITE_OTHER): Payer: Medicare Other | Admitting: Orthopaedic Surgery

## 2017-03-07 VITALS — BP 116/64 | HR 65 | Ht 60.0 in | Wt 200.0 lb

## 2017-03-07 DIAGNOSIS — M1612 Unilateral primary osteoarthritis, left hip: Secondary | ICD-10-CM

## 2017-03-07 DIAGNOSIS — M25552 Pain in left hip: Secondary | ICD-10-CM

## 2017-03-07 DIAGNOSIS — M25551 Pain in right hip: Secondary | ICD-10-CM | POA: Diagnosis not present

## 2017-03-07 NOTE — Progress Notes (Addendum)
Office Visit Note   Patient: [name]           Date of Birth: [birth year]           MRN: [id number] Visit Date: 03/07/2017              Requested by: Glendale Chard, [address] [place], [place] [address] PCP: Glendale Chard, MD   Assessment & Plan: Visit Diagnoses:  1. Bilateral hip pain   2. Unilateral primary osteoarthritis, left hip     Plan: Patient has developed significant progression of left hip osteoarthritis which is symptomatic and comparison x-rays last year. She is having significantly more problems ambulating than last year and has failed the use of a cane, intra-articular injections, Tylenol and also aspirin treatment. We discussed surgical options with the left total hip arthroplasty. We discussed operative technique denies and the hospital she'll try to arrange some family so she can avoid skilled facility if possible. She does have one sister is working. Questions were elicited and answered. Risks of surgery discussed including fracture,,  dislocation and revision surgery. Patient can discuss this with her family and then call about scheduling.  Follow-Up Instructions: No Follow-up on file.   Orders:  Orders Placed This Encounter  Procedures  . XR HIP UNILAT W OR W/O PELVIS 2-3 VIEWS LEFT  . XR HIP UNILAT W OR W/O PELVIS 1V RIGHT   No orders of the defined types were placed in this encounter.     Procedures: No procedures performed   Clinical Data: No additional findings.   Subjective: Chief Complaint  Patient presents with  . Right Hip - Pain  . Left Hip - Pain    HPI 83 year old female returns with bilateral hip pain left greater than right. She's had previous injection and hip got temporary relief and then recurrence of her symptoms. She's had ambulate with a cane pain bothers her at night when she tries to sleep and habits or with her community ambulation and she said sharp catching in her hip occasionally is for that she  will fall. She has problems putting her socks on. She's taken aspirin in the past also lighted cane patches without relief. She ambulates with the gait flexed at the hips to help with her pain. She cannot walk upright she pushes herself up with cane.  Review of Systems positive for history of pneumonia with partial right lobe removal of her lung 2010. Hernia repair 1975. Nonspecific ST and T-wave changes on EKG. Obesity with BMI of 39. History of hyperthyroidism, increased cholesterol. Previous hip injections with good relief but only temporarily. History of aortic calcification without dissection on CT scan 07/01/2016. Previous laparoscopic cholecystectomy 07/02/2016.   Objective: Vital Signs: BP 116/64   Pulse 65   Ht 5' (1.524 m)   Wt 200 lb (90.7 kg)   BMI 39.06 kg/m   Physical Exam  Constitutional: She is oriented to person, place, and time. She appears well-developed.  HENT:  Head: Normocephalic.  Right Ear: External ear normal.  Left Ear: External ear normal.  Eyes: Pupils are equal, round, and reactive to light.  Neck: No tracheal deviation present. No thyromegaly present.  Cardiovascular: Normal rate.   Pulmonary/Chest: Effort normal.  Abdominal: Soft.  Neurological: She is alert and oriented to person, place, and time.  Skin: Skin is warm and dry.  Psychiatric: She has a normal mood and affect. Her behavior is normal.    Ortho Exam patient has mild tenderness  to palpation lumbosacral junction. Bowel sciatic notch tenderness. Both trochanters are tender. Negative straight leg raising 90. Crepitus with bilateral knee range of motion. She has severe pain with internal/external rotation of her left hip limited to only 20 on the left with internal rotation and 30 external rotation which reproduces her pain in the groin and down toward her knee. Opposite right hip is 30 internal/external rotation with mild pain.  Specialty Comments:  No specialty comments  available.  Imaging: Xr Hip Unilat W Or W/o Pelvis 1v Right  Result Date: 03/07/2017 Standing AP pelvis and frog-leg right hip demonstrates moderate osteoarthritis with marginal osteophytes and subchondral sclerosis loss of joint space. Negative for femoral neck fracture. Impression: Moderate right hip osteoarthritis  Xr Hip Unilat W Or W/o Pelvis 2-3 Views Left  Result Date: 03/07/2017 Standing AP pelvis frog leg left hip demonstrates severe left hip osteoarthritis with loss of joint space flattening of the head subchondral sclerosis and the prominent marginal osteophytes. No lytic or sclerotic pelvic lesions. Impression: advanced left hip osteoarthritis    PMFS History: Patient Active Problem List   Diagnosis Date Noted  . Cough   . Dissection of aorta ([place])   . Abdominal pain   . Symptomatic cholelithiasis 07/01/2016  . Nonspecific ST-T wave electrocardiographic changes   . Lower leg edema 02/16/2015  . Obesity (BMI 30.0-34.9) 02/16/2015  . Musculoskeletal chest pain 12/16/2013  . Osteoporosis 01/11/2012  . Essential hypertension 01/11/2012  . Hyperthyroidism 01/11/2012  . Hypercholesteremia 01/11/2012  . H/O vaginal hysterectomy 1974 01/11/2012    Class: History of  . S/P removal of lung  partial R lobe  2010 01/11/2012  . History of hernia repair  R ing. 1975 01/11/2012  . BACK PAIN 11/12/2007  . DYSPNEA 11/11/2007   Past Medical History:  Diagnosis Date  . Bronchiectasis    isolated to RML; status post right middle lobe partial resection.  . H/O osteoporosis   . Hypertension   . Hypothyroidism   . OSA (obstructive sleep apnea)   . Yeast infection     Family History  Problem Relation Age of Onset  . Hypertension Mother   . CVA Mother 85  . Heart attack Sister   . Diabetes Sister   . Heart attack Brother   . Heart attack Brother     Past Surgical History:  Procedure Laterality Date  . ABDOMINAL HYSTERECTOMY  1974  . CHOLECYSTECTOMY N/A 07/02/2016    Procedure: LAPAROSCOPIC CHOLECYSTECTOMY;  Surgeon: Stark Klein, MD;  Location: [facility];  Service: General;  Laterality: N/A;  . [place]  . LUNG REMOVAL, PARTIAL  208   RML  . NM MYOVIEW LTD  September 2012   Normal EF. No ischemia or infarction.  Domingo Dimes ECHOCARDIOGRAM  September 2012   Mild concentric LVH. Normal EF with impaired relaxation. Mildly elevated RV pressures of 30 and 40 mmHg.   Social History   Occupational History  . Not on file.   Social History Main Topics  . Smoking status: Never Smoker  . Smokeless tobacco: Never Used  . Alcohol use No  . Drug use: No  . Sexual activity: No

## 2017-03-07 NOTE — Telephone Encounter (Signed)
Returned call to patient left message on voicemail to return call  °

## 2017-03-11 ENCOUNTER — Ambulatory Visit (INDEPENDENT_AMBULATORY_CARE_PROVIDER_SITE_OTHER): Payer: Medicare Other | Admitting: Cardiology

## 2017-03-11 ENCOUNTER — Encounter: Payer: Self-pay | Admitting: Cardiology

## 2017-03-11 VITALS — BP 112/72 | HR 76 | Ht 60.0 in | Wt 203.0 lb

## 2017-03-11 DIAGNOSIS — R9431 Abnormal electrocardiogram [ECG] [EKG]: Secondary | ICD-10-CM | POA: Diagnosis not present

## 2017-03-11 DIAGNOSIS — R0602 Shortness of breath: Secondary | ICD-10-CM

## 2017-03-11 DIAGNOSIS — Z01810 Encounter for preprocedural cardiovascular examination: Secondary | ICD-10-CM

## 2017-03-11 DIAGNOSIS — I10 Essential (primary) hypertension: Secondary | ICD-10-CM

## 2017-03-11 NOTE — Patient Instructions (Addendum)
CARDIAC CLEARANCE FOR  UPCOMING HIP SURGERY, NO FURTHER TESTINF NEEDED.   NO CHANGE WITH CURRENT MEDICATIONS   Your physician wants you to follow-up in [place].You will receive a reminder letter in the mail two months in advance. If you don't receive a letter, please call our office to schedule the follow-up appointment.   If you need a refill on your cardiac medications before your next appointment, please call your pharmacy.

## 2017-03-11 NOTE — Progress Notes (Signed)
PCP: Glendale Chard, MD  Clinic Note: Chief Complaint  Patient presents with  . Pre-op Exam     preop clearance for hip surgery    HPI: [name] is a 83 y.o. female with a PMH below who presents today for annual follow-up for dyspnea and fatigue. She is a moderately obese woman with a history of partial right middle lobectomy for a pneumonia 2008. She had a cardiac evaluation of Chest pain in March 2015 - felt to be musculoskeletal. Cardiac evaluation in September 2012 with an echocardiogram and Myoview both essentially normal  [name] was last seen on 03/08/2016 - no evidence of stable exertional dyspnea going up and down stairs. She noted she started walking with a cane for stability. Otherwise negative.  Recent Hospitalizations:   None since September 2017: admitted for epigastric pain and vomiting 1 day after having a relatively fatty meal. She is thought to have symptomatically cholelithiasis.initially a code STEMI was called because EKG was somewhat concerning. This was however canceled. CT of the chest abdomen and pelvis revealed no evidence of PE or aortic dissection.ruled out for MI and underwent laparoscopic cholecystectomy  Studies Personally Reviewed - (if available, images/films reviewed: From Epic Chart or Care Everywhere)  Transthoracic Echocardiogram September 2017: normal LV size and function. EF 60-65% with GRD 2 DD. Mild aortic stenosis. Mild LA dilation. Mild to moderately increased PA pressures (46 mmHg).  Interval History:  [name] presents today overall doing fairly well. She is here really to discuss preoperative evaluation for hip surgery. She really has been limited of late over the last month or so because of significant pain in her left hip.   up until the time her hip was bothering her, she was doing yard work and exercise at home. She actually Scared when she went to the [facility] because of parking and being in the strange location. , she therefore  decided to stay at home and do exercises at home. He was going up and down her stairs for exercise as well as doing calisthenics at home.once her hip started bothering her she is not due that. With the exercises she was doing, she is not noticing any problems or chest tightness, pressure or dyspnea with rest or exertion.  she has noted occasional positional dizziness , but this is usually only when she has forgotten to eat ,during the course of the day.  No PND, orthopnea or edema.  No palpitations, weakness or syncope/near syncope. No TIA/amaurosis fugax symptoms.  No claudication.  ROS: A comprehensive was performed. Review of Systems  HENT: Negative for congestion and nosebleeds.   Respiratory: Negative for shortness of breath.   Gastrointestinal: Negative for blood in stool and melena.  Genitourinary: Negative for hematuria.  Musculoskeletal: Positive for joint pain (Hip and knee osteoarthritis pain).  Psychiatric/Behavioral: Negative for memory loss. The patient is not nervous/anxious and does not have insomnia.   All other systems reviewed and are negative.  I have reviewed and (if needed) personally updated the patient's problem list, medications, allergies, past medical and surgical history, social and family history.   Past Medical History:  Diagnosis Date  . Bronchiectasis    isolated to RML; status post right middle lobe partial resection.  . H/O osteoporosis   . Hypertension   . Hypothyroidism   . OSA (obstructive sleep apnea)   . Yeast infection     Past Surgical History:  Procedure Laterality Date  . ABDOMINAL HYSTERECTOMY  1974  . CHOLECYSTECTOMY  N/A 07/02/2016   Procedure: LAPAROSCOPIC CHOLECYSTECTOMY;  Surgeon: Stark Klein, MD;  Location: [facility];  Service: General;  Laterality: N/A;  . [place]  . LUNG REMOVAL, PARTIAL  208   RML  . NM MYOVIEW LTD  06/2011   Normal EF. No ischemia or infarction.  . TRANSTHORACIC ECHOCARDIOGRAM  06/2011   Mild  concentric LVH. Normal EF with impaired relaxation. Mildly elevated RV pressures of 30 and 40 mmHg.  Marland Kitchen TRANSTHORACIC ECHOCARDIOGRAM  06/2016   normal LV size and function. EF 60-65% with GRD 2 DD. Mild aortic stenosis. Mild LA dilation. Mild to moderately increased PA pressures (46 mmHg).    Current Meds  Medication Sig  . acetaminophen (TYLENOL) 650 MG CR tablet Take 1,300 mg by mouth every 8 (eight) hours as needed for pain.   Marland Kitchen aspirin EC 81 MG tablet Take 81 mg by mouth at bedtime.  Marland Kitchen CALCIUM-MAGNESUIUM-ZINC 333-133-8.3 MG TABS Take 1 tablet by mouth daily.  . cholecalciferol (VITAMIN D) 1000 UNITS tablet Take 1,000 Units by mouth daily.  . fenofibrate (TRICOR) 145 MG tablet Take 1 tablet (145 mg total) by mouth daily.  . Flaxseed, Linseed, (FLAX SEED OIL PO) Take 1 capsule by mouth daily.   . furosemide (LASIX) 20 MG tablet Take 20 mg by mouth daily.    Marland Kitchen ibandronate (BONIVA) 150 MG tablet Boniva 150 mg tablet  Take 1 tablet every month by oral route.  . lidocaine (LIDODERM) 5 % Place 1 patch onto the skin daily as needed (hip pain).   . methimazole (TAPAZOLE) 5 MG tablet Take 5 mg by mouth daily.  . metoprolol succinate (TOPROL-XL) 25 MG 24 hr tablet Take 25 mg by mouth daily.  Marland Kitchen nystatin cream (MYCOSTATIN) Apply 1 application topically daily. Apply under breasts and on stomach folds  . oxyCODONE-acetaminophen (PERCOCET/ROXICET) 5-325 MG tablet Take 1 tablet by mouth every 4 (four) hours as needed for severe pain.  . pantoprazole (PROTONIX) 40 MG tablet Take 1 tablet (40 mg total) by mouth daily.  . potassium chloride SA (K-DUR,KLOR-CON) 20 MEQ tablet Take 20 mEq by mouth daily.   . vitamin C (ASCORBIC ACID) 500 MG tablet Take 500 mg by mouth daily.    Allergies  Allergen Reactions  . Ibuprofen Other (See Comments)    hallucinations  . Naproxen Sodium Other (See Comments)    hallucinations    Social History   Social History  . Marital status: Widowed    Spouse name: N/A    . Number of children: N/A  . Years of education: N/A   Social History Main Topics  . Smoking status: Never Smoker  . Smokeless tobacco: Never Used  . Alcohol use No  . Drug use: No  . Sexual activity: No   Other Topics Concern  . None   Social History Narrative   She is a widowed mother of 50, grandmother for, a great-grandmother 85.   She has been trying to do more exercise than before, but has been injured recently by her back pain.   She never smoked, doesn't drink alcohol.    family history includes CVA (age of onset: 80) in her mother; Diabetes in her sister; Heart attack in her brother, brother, and sister; Hypertension in her mother.  Wt Readings from Last 3 Encounters:  03/11/17 203 lb (92.1 kg)  03/07/17 200 lb (90.7 kg)  01/11/17 200 lb (90.7 kg)    PHYSICAL EXAM BP 112/72   Pulse 76   Ht  5' (1.524 m)   Wt 203 lb (92.1 kg)   BMI 39.65 kg/m  General appearance: alert, cooperative, appears younger than stated age, no distress. Moderate severely obese HEENT: /AT, EOMI, MMM, anicteric sclera Neck: no adenopathy, no carotid bruit and no JVD Lungs: clear to auscultation bilaterally, normal percussion bilaterally and non-labored Heart: regular rate and rhythm, S1 &S2 normal, no murmur, click, rub or gallop; unable to palpate PMI Abdomen: soft, non-tender; bowel sounds normal; no masses,  no organomegaly; no obvious HJR Extremities: extremities normal, atraumatic, no cyanosis, or edema Pulses: 2+ and symmetric;  Neurologic: Mental status: Alert & oriented x 3, thought content appropriate. Answers questions appropriately; non-focal exam.  Pleasant mood & affect.    Adult ECG Report  Rate:  76 ;  Rhythm: normal sinus rhythm and Nonspecific ST abnormalities with slight J-point elevation. Otherwise normal axis, intervals and durations;   Narrative Interpretation:  Not ST elevation MI   Other studies Reviewed: Additional studies/ records that were reviewed today  include:  Recent Labs:    No results found for: CHOL, HDL, LDLCALC, LDLDIRECT, TRIG, CHOLHDL Lab Results  Component Value Date   CREATININE 0.66 07/05/2016   BUN 7 07/05/2016   NA 139 07/05/2016   K 3.6 07/05/2016   CL 103 07/05/2016   CO2 31 07/05/2016   ASSESSMENT / PLAN: Problem List Items Addressed This Visit    DYSPNEA (Chronic)     Doing relatively well now. Has had negative ischemic evaluation in the past with normal echocardiogram as well. Likely was related to deconditioning.      Relevant Orders   EKG 12-Lead (Completed)   Essential hypertension (Chronic)     Well-controlled on current medications: Low-dose beta blocker      Relevant Orders   EKG 12-Lead (Completed)   Nonspecific ST-T wave electrocardiographic changes (Chronic)     She has chronic borderline inferior ST elevations on her EKGs that are clearly not associated with acute coronary syndrome.  Normal echocardiogram      Relevant Orders   EKG 12-Lead (Completed)   Preop cardiovascular exam - Primary    PREOPERATIVE CARDIAC RISK ASSESSMENT   Revised Cardiac Risk Index:  High Risk Surgery: no;   Defined as Intraperitoneal, intrathoracic or suprainguinal vascular  Active CAD: no; no agina  CHF: no; relatively normal Echo   Cerebrovascular Disease: no;  Diabetes: no; On Insulin: no  CKD (Cr >~ 2): no;   Total: 0 Estimated Risk of Adverse Outcome: LOW RISK FOR LOW RISK SURGERY  Estimated Risk of MI, PE, VF/VT (Cardiac Arrest), Complete Heart Block: <1 %   ACC/AHA Guidelines for "Clearance":  Step 1 - Need for Emergency Surgery: No: elective  If Yes - go straight to OR with perioperative surveillance  Step 2 - Active Cardiac Conditions (Unstable Angina, Decompensated HF, Significant  Arrhytmias - Complete HB, Mobitz II, Symptomatic VT or SVT, Severe Aortic Stenosis - mean gradient > 40 mmHg, Valve area < 1.0 cm2):   No:   If Yes - Evaluate & Treat per ACC/AHA Guidelines -- with no  active symptoms, no further evaluation is necessary.  Step 3 -  Low Risk Surgery: Yes  If Yes --> proceed to OR  If No --> Step 4  Step 4 - Functional Capacity >= 4 METS without symptoms: Yes  If Yes --> proceed to OR  If No --> Step 5  With relatively recent echocardiogram done within a year because of abnormalities on her EKG, patient does not  require any further cardiac evaluation for her hip surgery. In the absence of any symptoms, additional studies would not otherwise serve to change medical management. She is on a very low-dose Lasix for edema, not related to heart failure. She is on a beta blocker which also mitigates cardiovascular risk.        Relevant Orders   EKG 12-Lead (Completed)      Current medicines are reviewed at length with the patient today. (+/- concerns) n/a The following changes have been made: n/a  Patient Instructions  CARDIAC CLEARANCE FOR  UPCOMING HIP SURGERY, NO FURTHER TESTINF NEEDED.   NO CHANGE WITH CURRENT MEDICATIONS   Your physician wants you to follow-up in [place].You will receive a reminder letter in the mail two months in advance. If you don't receive a letter, please call our office to schedule the follow-up appointment.   If you need a refill on your cardiac medications before your next appointment, please call your pharmacy.    Studies Ordered:   Orders Placed This Encounter  Procedures  . EKG 12-Lead      Glenetta Hew, M.D., M.S. Interventional Cardiologist   Pager # [phone] Phone # [phone] [address] [place], [place] [address]

## 2017-03-12 ENCOUNTER — Encounter: Payer: Self-pay | Admitting: Cardiology

## 2017-03-12 DIAGNOSIS — Z01810 Encounter for preprocedural cardiovascular examination: Secondary | ICD-10-CM | POA: Insufficient documentation

## 2017-03-12 NOTE — Assessment & Plan Note (Signed)
PREOPERATIVE CARDIAC RISK ASSESSMENT   Revised Cardiac Risk Index:  High Risk Surgery: no;   Defined as Intraperitoneal, intrathoracic or suprainguinal vascular  Active CAD: no; no agina  CHF: no; relatively normal Echo   Cerebrovascular Disease: no;  Diabetes: no; On Insulin: no  CKD (Cr >~ 2): no;   Total: 0 Estimated Risk of Adverse Outcome: LOW RISK FOR LOW RISK SURGERY  Estimated Risk of MI, PE, VF/VT (Cardiac Arrest), Complete Heart Block: <1 %   ACC/AHA Guidelines for "Clearance":  Step 1 - Need for Emergency Surgery: No: elective  If Yes - go straight to OR with perioperative surveillance  Step 2 - Active Cardiac Conditions (Unstable Angina, Decompensated HF, Significant  Arrhytmias - Complete HB, Mobitz II, Symptomatic VT or SVT, Severe Aortic Stenosis - mean gradient > 40 mmHg, Valve area < 1.0 cm2):   No:   If Yes - Evaluate & Treat per ACC/AHA Guidelines -- with no active symptoms, no further evaluation is necessary.  Step 3 -  Low Risk Surgery: Yes  If Yes --> proceed to OR  If No --> Step 4  Step 4 - Functional Capacity >= 4 METS without symptoms: Yes  If Yes --> proceed to OR  If No --> Step 5  With relatively recent echocardiogram done within a year because of abnormalities on her EKG, patient does not require any further cardiac evaluation for her hip surgery. In the absence of any symptoms, additional studies would not otherwise serve to change medical management. She is on a very low-dose Lasix for edema, not related to heart failure. She is on a beta blocker which also mitigates cardiovascular risk.

## 2017-03-12 NOTE — Assessment & Plan Note (Signed)
She has chronic borderline inferior ST elevations on her EKGs that are clearly not associated with acute coronary syndrome.  Normal echocardiogram

## 2017-03-12 NOTE — Assessment & Plan Note (Signed)
Doing relatively well now. Has had negative ischemic evaluation in the past with normal echocardiogram as well. Likely was related to deconditioning.

## 2017-03-12 NOTE — Assessment & Plan Note (Signed)
Well-controlled on current medications: Low-dose beta blocker

## 2017-03-14 DIAGNOSIS — N182 Chronic kidney disease, stage 2 (mild): Secondary | ICD-10-CM | POA: Diagnosis not present

## 2017-03-14 DIAGNOSIS — Z6838 Body mass index (BMI) 38.0-38.9, adult: Secondary | ICD-10-CM | POA: Diagnosis not present

## 2017-03-14 DIAGNOSIS — M1612 Unilateral primary osteoarthritis, left hip: Secondary | ICD-10-CM | POA: Diagnosis not present

## 2017-03-14 DIAGNOSIS — I129 Hypertensive chronic kidney disease with stage 1 through stage 4 chronic kidney disease, or unspecified chronic kidney disease: Secondary | ICD-10-CM | POA: Diagnosis not present

## 2017-03-14 DIAGNOSIS — Z79899 Other long term (current) drug therapy: Secondary | ICD-10-CM | POA: Diagnosis not present

## 2017-03-21 ENCOUNTER — Telehealth (INDEPENDENT_AMBULATORY_CARE_PROVIDER_SITE_OTHER): Payer: Self-pay | Admitting: Orthopaedic Surgery

## 2017-03-21 NOTE — Telephone Encounter (Signed)
Ok for note 

## 2017-03-21 NOTE — Telephone Encounter (Signed)
Caren Griffins Richardson(daughter)[phone] called asking for work note to be written up for her to miss work on day of moms pre-op on 03-26-17. Advised she can get day of pre-op. She stated her work is on a point system and she needed it before shes out. Advised will send for assistant to review. States she will pick up ASAP if we will write.

## 2017-03-21 NOTE — Telephone Encounter (Signed)
Yes OK for note thanks 

## 2017-03-22 ENCOUNTER — Encounter (INDEPENDENT_AMBULATORY_CARE_PROVIDER_SITE_OTHER): Payer: Self-pay

## 2017-03-22 ENCOUNTER — Telehealth (INDEPENDENT_AMBULATORY_CARE_PROVIDER_SITE_OTHER): Payer: Self-pay | Admitting: Orthopaedic Surgery

## 2017-03-22 ENCOUNTER — Other Ambulatory Visit (INDEPENDENT_AMBULATORY_CARE_PROVIDER_SITE_OTHER): Payer: Self-pay | Admitting: Orthopaedic Surgery

## 2017-03-22 NOTE — Telephone Encounter (Signed)
Baker Janus from [facility] called requesting orders be put in for this pt. Pt is scheduled for surgery 6/29.

## 2017-03-22 NOTE — Telephone Encounter (Signed)
Note completed and put up front for patient's daughter to pick up. I left voicemail advising.

## 2017-03-26 ENCOUNTER — Other Ambulatory Visit (INDEPENDENT_AMBULATORY_CARE_PROVIDER_SITE_OTHER): Payer: Self-pay | Admitting: Orthopaedic Surgery

## 2017-03-26 ENCOUNTER — Encounter (HOSPITAL_COMMUNITY): Payer: Self-pay

## 2017-03-26 ENCOUNTER — Encounter (HOSPITAL_COMMUNITY)
Admission: RE | Admit: 2017-03-26 | Discharge: 2017-03-26 | Disposition: A | Discharge status: Home / Self Care | Payer: Medicare Other | Source: Ambulatory Visit | Attending: Orthopaedic Surgery | Admitting: Orthopaedic Surgery

## 2017-03-26 DIAGNOSIS — R9431 Abnormal electrocardiogram [ECG] [EKG]: Secondary | ICD-10-CM | POA: Insufficient documentation

## 2017-03-26 DIAGNOSIS — Z01812 Encounter for preprocedural laboratory examination: Secondary | ICD-10-CM | POA: Diagnosis not present

## 2017-03-26 DIAGNOSIS — M1612 Unilateral primary osteoarthritis, left hip: Secondary | ICD-10-CM | POA: Insufficient documentation

## 2017-03-26 DIAGNOSIS — I10 Essential (primary) hypertension: Secondary | ICD-10-CM | POA: Insufficient documentation

## 2017-03-26 HISTORY — DX: Calculus of gallbladder without cholecystitis without obstruction: K80.20

## 2017-03-26 HISTORY — DX: Unspecified osteoarthritis, unspecified site: M19.90

## 2017-03-26 HISTORY — DX: Dyspnea, unspecified: R06.00

## 2017-03-26 HISTORY — DX: Adverse effect of unspecified anesthetic, initial encounter: T41.45XA

## 2017-03-26 HISTORY — DX: Other complications of anesthesia, initial encounter: T88.59XA

## 2017-03-26 HISTORY — DX: Gastro-esophageal reflux disease without esophagitis: K21.9

## 2017-03-26 LAB — CBC
HCT: 35.4 % — ABNORMAL LOW (ref 36.0–46.0)
Hemoglobin: 12.4 g/dL (ref 12.0–15.0)
MCH: 28 pg (ref 26.0–34.0)
MCHC: 35 g/dL (ref 30.0–36.0)
MCV: 79.9 fL (ref 78.0–100.0)
Platelets: 384 10*3/uL (ref 150–400)
RBC: 4.43 MIL/uL (ref 3.87–5.11)
RDW: 14 % (ref 11.5–15.5)
WBC: 4.7 10*3/uL (ref 4.0–10.5)

## 2017-03-26 LAB — BASIC METABOLIC PANEL
Anion gap: 9 (ref 5–15)
BUN: 16 mg/dL (ref 6–20)
CO2: 24 mmol/L (ref 22–32)
Calcium: 9.5 mg/dL (ref 8.9–10.3)
Chloride: 107 mmol/L (ref 101–111)
Creatinine, Ser: 0.76 mg/dL (ref 0.44–1.00)
GFR calc Af Amer: >60 mL/min (ref >60)
GFR calc non Af Amer: >60 mL/min (ref >60)
Glucose, Bld: 105 mg/dL — ABNORMAL HIGH (ref 65–99)
Potassium: 4 mmol/L (ref 3.5–5.1)
Sodium: 140 mmol/L (ref 135–145)

## 2017-03-26 LAB — SURGICAL PCR SCREEN
MRSA, PCR: NEGATIVE
Staphylococcus aureus: NEGATIVE

## 2017-03-26 NOTE — Progress Notes (Signed)
   03/26/17 1013  OBSTRUCTIVE SLEEP APNEA  Have you ever been diagnosed with sleep apnea through a sleep study? No  Do you snore loudly (loud enough to be heard through closed doors)?  1  Do you often feel tired, fatigued, or sleepy during the daytime (such as falling asleep during driving or talking to someone)? 0  Has anyone observed you stop breathing during your sleep? 0  Do you have, or are you being treated for high blood pressure? 1  BMI more than 35 kg/m2? 1  Age > 50 (1-yes) 1  Neck circumference greater than:Female 16 inches or larger, Female 17inches or larger? 1  Female Gender (Yes=1) 0  Obstructive Sleep Apnea Score 5  Score 5 or greater  Results sent to PCP

## 2017-03-26 NOTE — Pre-Procedure Instructions (Signed)
CAELA HUOT  03/26/2017      [facility] [place] [address] [place] ([place]) [place] [address] Phone: [phone] Fax: [phone]  [place], [place] [address] Phone: [phone] Fax: [phone]    Your procedure is scheduled on June 29  Report to [place] at [place].M.  Call this number if you have problems the morning of surgery:  [phone]   Remember:  Do not eat food or drink liquids after midnight.   Take these medicines the morning of surgery with A SIP OF WATER acetaminophen (TYLENOL), methimazole (TAPAZOLE),  metoprolol succinate (TOPROL-XL)   7 days prior to surgery STOP taking any Aspirin, Aleve, Naproxen, Ibuprofen, Motrin, Advil, Goody's, BC's, all herbal medications, fish oil, and all vitamins    Do not wear jewelry, make-up or nail polish.  Do not wear lotions, powders, or perfumes, or deoderant.  Do not shave 48 hours prior to surgery.    Do not bring valuables to the hospital.  [facility] is not responsible for any belongings or valuables.  Contacts, dentures or bridgework may not be worn into surgery.  Leave your suitcase in the car.  After surgery it may be brought to your room.  For patients admitted to the hospital, discharge time will be determined by your treatment team.  Patients discharged the day of surgery will not be allowed to drive home.    Special instructions:   [place]- Preparing For Surgery  Before surgery, you can play an important role. Because skin is not sterile, your skin needs to be as free of germs as possible. You can reduce the number of germs on your skin by washing with CHG (chlorahexidine gluconate) Soap before surgery.  CHG is an antiseptic cleaner which kills germs and bonds with the skin to continue killing germs even after washing.  Please do not use if  you have an allergy to CHG or antibacterial soaps. If your skin becomes reddened/irritated stop using the CHG.  Do not shave (including legs and underarms) for at least 48 hours prior to first CHG shower. It is OK to shave your face.  Please follow these instructions carefully.   1. Shower the NIGHT BEFORE SURGERY and the MORNING OF SURGERY with CHG.   2. If you chose to wash your hair, wash your hair first as usual with your normal shampoo.  3. After you shampoo, rinse your hair and body thoroughly to remove the shampoo.  4. Use CHG as you would any other liquid soap. You can apply CHG directly to the skin and wash gently with a scrungie or a clean washcloth.   5. Apply the CHG Soap to your body ONLY FROM THE NECK DOWN.  Do not use on open wounds or open sores. Avoid contact with your eyes, ears, mouth and genitals (private parts). Wash genitals (private parts) with your normal soap.  6. Wash thoroughly, paying special attention to the area where your surgery will be performed.  7. Thoroughly rinse your body with warm water from the neck down.  8. DO NOT shower/wash with your normal soap after using and rinsing off the CHG Soap.  9. Pat yourself dry with a CLEAN TOWEL.   10. Wear CLEAN PAJAMAS   11. Place CLEAN SHEETS on your bed the night of your first shower and  DO NOT SLEEP WITH PETS.    Day of Surgery: Do not apply any deodorants/lotions. Please wear clean clothes to the hospital/surgery center.      Please read over the following fact sheets that you were given.

## 2017-03-26 NOTE — Progress Notes (Signed)
PCP - [place] Chard Cardiologist - [place]  Chest x-ray - 07/03/16 EKG - 03/11/17 Stress Test - 12/22/08 ECHO - 07/05/16 Cardiac Cath - denies  Dr. Ellyn Hack Clearance note 03/12/17    Patient denies shortness of breath, fever, cough and chest pain at PAT appointment   Patient verbalized understanding of instructions that were given to them at the PAT appointment. Patient was also instructed that they will need to review over the PAT instructions again at home before surgery.

## 2017-04-04 ENCOUNTER — Encounter (HOSPITAL_COMMUNITY): Payer: Self-pay | Admitting: Anesthesiology

## 2017-04-04 MED ORDER — CEFAZOLIN SODIUM-DEXTROSE 2-4 GM/100ML-% IV SOLN
2.0000 g | INTRAVENOUS | Status: AC
  Administered 2017-04-05: 2 g via INTRAVENOUS
  Filled 2017-04-04: qty 100

## 2017-04-04 NOTE — H&P (Signed)
TOTAL HIP ADMISSION H&P  Patient is admitted for left total hip arthroplasty.  Subjective:  Chief Complaint: left hip pain  HPI: [name], 83 y.o. female, has a history of pain and functional disability in the left hip(s) due to arthritis and patient has failed non-surgical conservative treatments for greater than 12 weeks to include NSAID's and/or analgesics, use of assistive devices and activity modification.  Onset of symptoms was gradual starting 10 years ago with gradually worsening course since that time.  Patient currently rates pain in the left hip at 10 out of 10 with activity. Patient has worsening of pain with activity and weight bearing, trendelenberg gait, pain that interfers with activities of daily living and crepitus. Patient has evidence of subchondral sclerosis, periarticular osteophytes and joint space narrowing by imaging studies. This condition presents safety issues increasing the risk of falls.   Patient Active Problem List   Diagnosis Date Noted  . Preop cardiovascular exam 03/12/2017  . Cough   . Abdominal pain   . Symptomatic cholelithiasis 07/01/2016  . Nonspecific ST-T wave electrocardiographic changes   . Lower leg edema 02/16/2015  . Obesity (BMI 30.0-34.9) 02/16/2015  . Osteoporosis 01/11/2012  . Essential hypertension 01/11/2012  . Hyperthyroidism 01/11/2012  . Hypercholesteremia 01/11/2012  . H/O vaginal hysterectomy 1974 01/11/2012    Class: History of  . S/P removal of lung  partial R lobe  2010 01/11/2012  . History of hernia repair  R ing. 1975 01/11/2012  . BACK PAIN 11/12/2007  . DYSPNEA 11/11/2007   Past Medical History:  Diagnosis Date  . Arthritis    left hip and back  . Bronchiectasis    isolated to RML; status post right middle lobe partial resection.  . Complication of anesthesia    hard to wake up  . Dyspnea   . Gall stones   . GERD (gastroesophageal reflux disease)    history   . H/O osteoporosis   . Hypertension   .  Hypothyroidism   . Yeast infection     Past Surgical History:  Procedure Laterality Date  . ABDOMINAL HYSTERECTOMY  1974  . CHOLECYSTECTOMY N/A 07/02/2016   Procedure: LAPAROSCOPIC CHOLECYSTECTOMY;  Surgeon: Stark Klein, MD;  Location: [facility];  Service: General;  Laterality: N/A;  . COLONOSCOPY    . EYE SURGERY     stye removed  . HERNIA REPAIR  1975  . KNEE SURGERY Left   . LUNG REMOVAL, PARTIAL  208   RML  . NM MYOVIEW LTD  06/2011   Normal EF. No ischemia or infarction.  . TRANSTHORACIC ECHOCARDIOGRAM  06/2011   Mild concentric LVH. Normal EF with impaired relaxation. Mildly elevated RV pressures of 30 and 40 mmHg.  Marland Kitchen TRANSTHORACIC ECHOCARDIOGRAM  06/2016   normal LV size and function. EF 60-65% with GRD 2 DD. Mild aortic stenosis. Mild LA dilation. Mild to moderately increased PA pressures (46 mmHg).    No prescriptions prior to admission.   Allergies  Allergen Reactions  . Ibuprofen Other (See Comments)    hallucinations  . Naproxen Sodium Other (See Comments)    hallucinations    Social History  Substance Use Topics  . Smoking status: Never Smoker  . Smokeless tobacco: Never Used  . Alcohol use No    Family History  Problem Relation Age of Onset  . Hypertension Mother   . CVA Mother 16  . Heart attack Sister   . Diabetes Sister   . Heart attack Brother   . Heart  attack Brother      Review of Systems  Constitutional: Negative.   HENT: Negative.   Eyes: Negative.   Respiratory: Negative.   Cardiovascular: Negative.   Gastrointestinal: Negative.   Genitourinary: Negative.   Musculoskeletal: Positive for joint pain.  Skin: Negative.   Neurological: Negative.   Psychiatric/Behavioral: Negative.     Objective:  Physical Exam  Constitutional: She is oriented to person, place, and time. She appears well-developed. No distress.  HENT:  Head: Normocephalic.  Eyes: EOM are normal. Pupils are equal, round, and reactive to light.  Respiratory: No  respiratory distress.  GI: She exhibits no distension.  Musculoskeletal: She exhibits tenderness.  Neurological: She is alert and oriented to person, place, and time.  Skin: Skin is warm and dry.  Psychiatric: She has a normal mood and affect.    Vital signs in last 24 hours:    Labs:   Estimated body mass index is 39.2 kg/m as calculated from the following:   Height as of 03/26/17: 5' (1.524 m).   Weight as of 03/26/17: 200 lb 11.2 oz (91 kg).   Imaging Review Plain radiographs demonstrate moderate degenerative joint disease of the left hip(s). The bone quality appears to be good for age and reported activity level.  Assessment/Plan:  End stage arthritis, left hip(s)  The patient history, physical examination, clinical judgement of the provider and imaging studies are consistent with end stage degenerative joint disease of the left hip(s) and total hip arthroplasty is deemed medically necessary. The treatment options including medical management, injection therapy, arthroscopy and arthroplasty were discussed at length. The risks and benefits of total hip arthroplasty were presented and reviewed. The risks due to aseptic loosening, infection, stiffness, dislocation/subluxation,  thromboembolic complications and other imponderables were discussed.  The patient acknowledged the explanation, agreed to proceed with the plan and consent was signed. Patient is being admitted for inpatient treatment for surgery, pain control, PT, OT, prophylactic antibiotics, VTE prophylaxis, progressive ambulation and ADL's and discharge planning.The patient is planning to be discharged home with home health services

## 2017-04-05 ENCOUNTER — Inpatient Hospital Stay (HOSPITAL_COMMUNITY): Payer: Medicare Other

## 2017-04-05 ENCOUNTER — Inpatient Hospital Stay (HOSPITAL_COMMUNITY)
Admission: RE | Admit: 2017-04-05 | Discharge: 2017-04-08 | DRG: 470 | Disposition: A | Discharge status: Home Health | Payer: Medicare Other | Source: Ambulatory Visit | Attending: Orthopaedic Surgery | Admitting: Orthopaedic Surgery

## 2017-04-05 ENCOUNTER — Encounter (HOSPITAL_COMMUNITY)
Admission: RE | Disposition: A | Discharge status: Home Health | Payer: Self-pay | Source: Ambulatory Visit | Attending: Orthopaedic Surgery

## 2017-04-05 ENCOUNTER — Inpatient Hospital Stay (HOSPITAL_COMMUNITY): Payer: Medicare Other | Admitting: Anesthesiology

## 2017-04-05 ENCOUNTER — Encounter (HOSPITAL_COMMUNITY): Payer: Self-pay | Admitting: General Practice

## 2017-04-05 DIAGNOSIS — I10 Essential (primary) hypertension: Secondary | ICD-10-CM | POA: Diagnosis not present

## 2017-04-05 DIAGNOSIS — Z886 Allergy status to analgesic agent status: Secondary | ICD-10-CM | POA: Diagnosis not present

## 2017-04-05 DIAGNOSIS — D62 Acute posthemorrhagic anemia: Secondary | ICD-10-CM | POA: Diagnosis not present

## 2017-04-05 DIAGNOSIS — Z96642 Presence of left artificial hip joint: Secondary | ICD-10-CM | POA: Diagnosis not present

## 2017-04-05 DIAGNOSIS — M810 Age-related osteoporosis without current pathological fracture: Secondary | ICD-10-CM | POA: Diagnosis present

## 2017-04-05 DIAGNOSIS — M1612 Unilateral primary osteoarthritis, left hip: Secondary | ICD-10-CM | POA: Diagnosis not present

## 2017-04-05 DIAGNOSIS — M25552 Pain in left hip: Secondary | ICD-10-CM | POA: Diagnosis not present

## 2017-04-05 DIAGNOSIS — Z79899 Other long term (current) drug therapy: Secondary | ICD-10-CM

## 2017-04-05 DIAGNOSIS — E7800 Pure hypercholesterolemia, unspecified: Secondary | ICD-10-CM | POA: Diagnosis not present

## 2017-04-05 DIAGNOSIS — Z419 Encounter for procedure for purposes other than remedying health state, unspecified: Secondary | ICD-10-CM

## 2017-04-05 DIAGNOSIS — M169 Osteoarthritis of hip, unspecified: Secondary | ICD-10-CM

## 2017-04-05 DIAGNOSIS — K219 Gastro-esophageal reflux disease without esophagitis: Secondary | ICD-10-CM | POA: Diagnosis present

## 2017-04-05 DIAGNOSIS — Z96641 Presence of right artificial hip joint: Secondary | ICD-10-CM | POA: Diagnosis not present

## 2017-04-05 DIAGNOSIS — K8020 Calculus of gallbladder without cholecystitis without obstruction: Secondary | ICD-10-CM | POA: Diagnosis not present

## 2017-04-05 DIAGNOSIS — E039 Hypothyroidism, unspecified: Secondary | ICD-10-CM | POA: Diagnosis present

## 2017-04-05 DIAGNOSIS — Z09 Encounter for follow-up examination after completed treatment for conditions other than malignant neoplasm: Secondary | ICD-10-CM

## 2017-04-05 DIAGNOSIS — Z471 Aftercare following joint replacement surgery: Secondary | ICD-10-CM | POA: Diagnosis not present

## 2017-04-05 DIAGNOSIS — R918 Other nonspecific abnormal finding of lung field: Secondary | ICD-10-CM | POA: Diagnosis not present

## 2017-04-05 HISTORY — PX: TOTAL HIP ARTHROPLASTY: SHX124

## 2017-04-05 LAB — COMPREHENSIVE METABOLIC PANEL
ALT: 11 U/L — ABNORMAL LOW (ref 14–54)
AST: 19 U/L (ref 15–41)
Albumin: 3.6 g/dL (ref 3.5–5.0)
Alkaline Phosphatase: 41 U/L (ref 38–126)
Anion gap: 7 (ref 5–15)
BUN: 20 mg/dL (ref 6–20)
CO2: 25 mmol/L (ref 22–32)
Calcium: 9 mg/dL (ref 8.9–10.3)
Chloride: 107 mmol/L (ref 101–111)
Creatinine, Ser: 0.72 mg/dL (ref 0.44–1.00)
GFR calc Af Amer: >60 mL/min (ref >60)
GFR calc non Af Amer: >60 mL/min (ref >60)
Glucose, Bld: 103 mg/dL — ABNORMAL HIGH (ref 65–99)
Potassium: 3.4 mmol/L — ABNORMAL LOW (ref 3.5–5.1)
Sodium: 139 mmol/L (ref 135–145)
Total Bilirubin: 0.7 mg/dL (ref 0.3–1.2)
Total Protein: 7 g/dL (ref 6.5–8.1)

## 2017-04-05 LAB — PROTIME-INR
INR: 1.08
Prothrombin Time: 14 seconds (ref 11.4–15.2)

## 2017-04-05 SURGERY — ARTHROPLASTY, HIP, TOTAL, ANTERIOR APPROACH
Anesthesia: Spinal | Laterality: Left

## 2017-04-05 MED ORDER — FENTANYL CITRATE (PF) 100 MCG/2ML IJ SOLN
INTRAMUSCULAR | Status: DC | PRN
  Administered 2017-04-05 (×2): 50 ug via INTRAVENOUS

## 2017-04-05 MED ORDER — HYDROMORPHONE HCL 1 MG/ML IJ SOLN
INTRAMUSCULAR | Status: AC
  Filled 2017-04-05: qty 0.5

## 2017-04-05 MED ORDER — METOCLOPRAMIDE HCL 5 MG PO TABS
5.0000 mg | ORAL_TABLET | Freq: Three times a day (TID) | ORAL | Status: DC | PRN
Start: 2017-04-05 — End: 2017-04-08

## 2017-04-05 MED ORDER — FENTANYL CITRATE (PF) 100 MCG/2ML IJ SOLN
INTRAMUSCULAR | Status: AC
  Filled 2017-04-05: qty 2

## 2017-04-05 MED ORDER — BUPIVACAINE HCL (PF) 0.25 % IJ SOLN
INTRAMUSCULAR | Status: AC
  Filled 2017-04-05: qty 30

## 2017-04-05 MED ORDER — EPHEDRINE SULFATE 50 MG/ML IJ SOLN
INTRAMUSCULAR | Status: DC | PRN
  Administered 2017-04-05: 10 mg via INTRAVENOUS
  Administered 2017-04-05: 15 mg via INTRAVENOUS

## 2017-04-05 MED ORDER — ONDANSETRON HCL 4 MG/2ML IJ SOLN
INTRAMUSCULAR | Status: AC
  Filled 2017-04-05: qty 2

## 2017-04-05 MED ORDER — BUPIVACAINE HCL (PF) 0.5 % IJ SOLN
INTRAMUSCULAR | Status: DC | PRN
  Administered 2017-04-05: 2.8 mL via INTRATHECAL

## 2017-04-05 MED ORDER — 0.9 % SODIUM CHLORIDE (POUR BTL) OPTIME
TOPICAL | Status: DC | PRN
  Administered 2017-04-05: 1000 mL

## 2017-04-05 MED ORDER — MIDAZOLAM HCL 2 MG/2ML IJ SOLN
INTRAMUSCULAR | Status: AC
  Filled 2017-04-05: qty 2

## 2017-04-05 MED ORDER — HYDROCODONE-ACETAMINOPHEN 7.5-325 MG PO TABS
1.0000 | ORAL_TABLET | Freq: Four times a day (QID) | ORAL | Status: DC | PRN
  Administered 2017-04-05 – 2017-04-08 (×11): 1 via ORAL
  Filled 2017-04-05 (×10): qty 1

## 2017-04-05 MED ORDER — MENTHOL 3 MG MT LOZG: 1.0000 | LOZENGE | OROMUCOSAL | Status: DC | PRN

## 2017-04-05 MED ORDER — ONDANSETRON HCL 4 MG/2ML IJ SOLN
INTRAMUSCULAR | Status: DC | PRN
  Administered 2017-04-05: 4 mg via INTRAVENOUS

## 2017-04-05 MED ORDER — ONDANSETRON HCL 4 MG/2ML IJ SOLN: 4.0000 mg | Freq: Four times a day (QID) | INTRAMUSCULAR | Status: DC | PRN

## 2017-04-05 MED ORDER — EPHEDRINE 5 MG/ML INJ
INTRAVENOUS | Status: AC
  Filled 2017-04-05: qty 10

## 2017-04-05 MED ORDER — PHENOL 1.4 % MT LIQD: 1.0000 | OROMUCOSAL | Status: DC | PRN

## 2017-04-05 MED ORDER — METOPROLOL SUCCINATE ER 25 MG PO TB24
25.0000 mg | ORAL_TABLET | Freq: Every day | ORAL | Status: DC
  Administered 2017-04-06 – 2017-04-08 (×3): 25 mg via ORAL
  Filled 2017-04-05 (×3): qty 1

## 2017-04-05 MED ORDER — DOCUSATE SODIUM 100 MG PO CAPS
100.0000 mg | ORAL_CAPSULE | Freq: Two times a day (BID) | ORAL | Status: DC
  Administered 2017-04-05 – 2017-04-08 (×5): 100 mg via ORAL
  Filled 2017-04-05 (×6): qty 1

## 2017-04-05 MED ORDER — POLYETHYLENE GLYCOL 3350 17 G PO PACK: 17.0000 g | PACK | Freq: Every day | ORAL | Status: DC | PRN

## 2017-04-05 MED ORDER — ASPIRIN EC 325 MG PO TBEC
325.0000 mg | DELAYED_RELEASE_TABLET | Freq: Every day | ORAL | Status: DC
  Administered 2017-04-06 – 2017-04-08 (×3): 325 mg via ORAL
  Filled 2017-04-05 (×3): qty 1

## 2017-04-05 MED ORDER — HYDROCODONE-ACETAMINOPHEN 5-325 MG PO TABS
ORAL_TABLET | ORAL | Status: AC
  Filled 2017-04-05: qty 1

## 2017-04-05 MED ORDER — CHLORHEXIDINE GLUCONATE 4 % EX LIQD: 60.0000 mL | Freq: Once | CUTANEOUS | Status: DC

## 2017-04-05 MED ORDER — BUPIVACAINE LIPOSOME 1.3 % IJ SUSP
20.0000 mL | INTRAMUSCULAR | Status: AC
  Administered 2017-04-05: 10 mL
  Filled 2017-04-05: qty 20

## 2017-04-05 MED ORDER — FENTANYL CITRATE (PF) 250 MCG/5ML IJ SOLN
INTRAMUSCULAR | Status: AC
  Filled 2017-04-05: qty 5

## 2017-04-05 MED ORDER — PROPOFOL 500 MG/50ML IV EMUL
INTRAVENOUS | Status: DC | PRN
  Administered 2017-04-05: 25 ug/kg/min via INTRAVENOUS

## 2017-04-05 MED ORDER — PROPOFOL 10 MG/ML IV BOLUS
INTRAVENOUS | Status: AC
  Filled 2017-04-05: qty 20

## 2017-04-05 MED ORDER — FUROSEMIDE 20 MG PO TABS
20.0000 mg | ORAL_TABLET | Freq: Every day | ORAL | Status: DC
  Administered 2017-04-05 – 2017-04-08 (×4): 20 mg via ORAL
  Filled 2017-04-05 (×4): qty 1

## 2017-04-05 MED ORDER — METOCLOPRAMIDE HCL 5 MG/ML IJ SOLN: 5.0000 mg | Freq: Three times a day (TID) | INTRAMUSCULAR | Status: DC | PRN

## 2017-04-05 MED ORDER — LACTATED RINGERS IV SOLN
INTRAVENOUS | Status: DC | PRN
  Administered 2017-04-05 (×2): via INTRAVENOUS

## 2017-04-05 MED ORDER — ONDANSETRON HCL 4 MG/2ML IJ SOLN: 4.0000 mg | Freq: Once | INTRAMUSCULAR | Status: DC | PRN

## 2017-04-05 MED ORDER — FENTANYL CITRATE (PF) 100 MCG/2ML IJ SOLN
25.0000 ug | INTRAMUSCULAR | Status: DC | PRN
  Administered 2017-04-05 (×3): 50 ug via INTRAVENOUS

## 2017-04-05 MED ORDER — DEXAMETHASONE SODIUM PHOSPHATE 10 MG/ML IJ SOLN
INTRAMUSCULAR | Status: DC | PRN
  Administered 2017-04-05: 10 mg via INTRAVENOUS

## 2017-04-05 MED ORDER — DEXAMETHASONE SODIUM PHOSPHATE 10 MG/ML IJ SOLN
INTRAMUSCULAR | Status: AC
  Filled 2017-04-05: qty 1

## 2017-04-05 MED ORDER — ACETAMINOPHEN 325 MG PO TABS
650.0000 mg | ORAL_TABLET | Freq: Four times a day (QID) | ORAL | Status: DC | PRN
  Administered 2017-04-06 – 2017-04-07 (×3): 650 mg via ORAL
  Filled 2017-04-05 (×3): qty 2

## 2017-04-05 MED ORDER — SODIUM CHLORIDE 0.9 % IV SOLN
INTRAVENOUS | Status: DC
  Administered 2017-04-05 – 2017-04-06 (×2): via INTRAVENOUS

## 2017-04-05 MED ORDER — LIDOCAINE 2% (20 MG/ML) 5 ML SYRINGE
INTRAMUSCULAR | Status: AC
  Filled 2017-04-05: qty 5

## 2017-04-05 MED ORDER — TRANEXAMIC ACID 1000 MG/10ML IV SOLN
1000.0000 mg | INTRAVENOUS | Status: AC
  Administered 2017-04-05: 1000 mg via INTRAVENOUS
  Filled 2017-04-05: qty 10

## 2017-04-05 MED ORDER — HYDROCODONE-ACETAMINOPHEN 7.5-325 MG PO TABS
ORAL_TABLET | ORAL | Status: AC
  Filled 2017-04-05: qty 1

## 2017-04-05 MED ORDER — PHENYLEPHRINE HCL 10 MG/ML IJ SOLN
INTRAVENOUS | Status: DC | PRN
  Administered 2017-04-05: 30 ug/min via INTRAVENOUS

## 2017-04-05 MED ORDER — BUPIVACAINE-EPINEPHRINE 0.25% -1:200000 IJ SOLN
INTRAMUSCULAR | Status: DC | PRN
  Administered 2017-04-05: 10 mL

## 2017-04-05 MED ORDER — HYDROMORPHONE HCL 1 MG/ML IJ SOLN
0.5000 mg | INTRAMUSCULAR | Status: DC | PRN
  Administered 2017-04-05 – 2017-04-08 (×11): 0.5 mg via INTRAVENOUS
  Filled 2017-04-05 (×12): qty 1

## 2017-04-05 MED ORDER — METHIMAZOLE 5 MG PO TABS
5.0000 mg | ORAL_TABLET | Freq: Every day | ORAL | Status: DC
  Administered 2017-04-06 – 2017-04-08 (×3): 5 mg via ORAL
  Filled 2017-04-05 (×3): qty 1

## 2017-04-05 MED ORDER — ACETAMINOPHEN 650 MG RE SUPP: 650.0000 mg | Freq: Four times a day (QID) | RECTAL | Status: DC | PRN

## 2017-04-05 MED ORDER — FENOFIBRATE 160 MG PO TABS
160.0000 mg | ORAL_TABLET | Freq: Every day | ORAL | Status: DC
  Administered 2017-04-05 – 2017-04-08 (×4): 160 mg via ORAL
  Filled 2017-04-05 (×4): qty 1

## 2017-04-05 MED ORDER — ONDANSETRON HCL 4 MG PO TABS: 4.0000 mg | ORAL_TABLET | Freq: Four times a day (QID) | ORAL | Status: DC | PRN

## 2017-04-05 MED ORDER — CEFAZOLIN SODIUM-DEXTROSE 1-4 GM/50ML-% IV SOLN
1.0000 g | Freq: Four times a day (QID) | INTRAVENOUS | Status: AC
  Administered 2017-04-05 (×2): 1 g via INTRAVENOUS
  Filled 2017-04-05 (×2): qty 50

## 2017-04-05 MED ORDER — MIDAZOLAM HCL 5 MG/5ML IJ SOLN
INTRAMUSCULAR | Status: DC | PRN
  Administered 2017-04-05 (×2): 1 mg via INTRAVENOUS

## 2017-04-05 SURGICAL SUPPLY — 49 items
APL SKNCLS STERI-STRIP NONHPOA (GAUZE/BANDAGES/DRESSINGS) ×1
BENZOIN TINCTURE PRP APPL 2/3 (GAUZE/BANDAGES/DRESSINGS) ×2 IMPLANT
BLADE CLIPPER SURG (BLADE) ×1 IMPLANT
BLADE SAW SGTL 18X1.27X75 (BLADE) ×2 IMPLANT
CAPT HIP TOTAL 2 ×1 IMPLANT
CELLS DAT CNTRL 66122 CELL SVR (MISCELLANEOUS) ×1 IMPLANT
COVER SURGICAL LIGHT HANDLE (MISCELLANEOUS) ×2 IMPLANT
DRAPE C-ARM 42X72 X-RAY (DRAPES) ×2 IMPLANT
DRAPE IMP U-DRAPE 54X76 (DRAPES) ×2 IMPLANT
DRAPE STERI IOBAN 125X83 (DRAPES) ×2 IMPLANT
DRAPE U-SHAPE 47X51 STRL (DRAPES) ×6 IMPLANT
DRSG MEPILEX BORDER 4X8 (GAUZE/BANDAGES/DRESSINGS) ×2 IMPLANT
DURAPREP 26ML APPLICATOR (WOUND CARE) ×2 IMPLANT
ELECT BLADE 4.0 EZ CLEAN MEGAD (MISCELLANEOUS)
ELECT CAUTERY BLADE 6.4 (BLADE) ×2 IMPLANT
ELECT REM PT RETURN 9FT ADLT (ELECTROSURGICAL) ×2
ELECTRODE BLDE 4.0 EZ CLN MEGD (MISCELLANEOUS) IMPLANT
ELECTRODE REM PT RTRN 9FT ADLT (ELECTROSURGICAL) ×1 IMPLANT
FACESHIELD WRAPAROUND (MASK) ×4 IMPLANT
FACESHIELD WRAPAROUND OR TEAM (MASK) ×2 IMPLANT
GLOVE BIOGEL PI IND STRL 8 (GLOVE) ×2 IMPLANT
GLOVE BIOGEL PI INDICATOR 8 (GLOVE) ×2
GLOVE ORTHO TXT STRL SZ7.5 (GLOVE) ×4 IMPLANT
GOWN STRL REUS W/ TWL LRG LVL3 (GOWN DISPOSABLE) ×1 IMPLANT
GOWN STRL REUS W/ TWL XL LVL3 (GOWN DISPOSABLE) ×1 IMPLANT
GOWN STRL REUS W/TWL 2XL LVL3 (GOWN DISPOSABLE) ×2 IMPLANT
GOWN STRL REUS W/TWL LRG LVL3 (GOWN DISPOSABLE) ×2
GOWN STRL REUS W/TWL XL LVL3 (GOWN DISPOSABLE) ×2
KIT BASIN OR (CUSTOM PROCEDURE TRAY) ×2 IMPLANT
KIT ROOM TURNOVER OR (KITS) ×2 IMPLANT
MANIFOLD NEPTUNE II (INSTRUMENTS) ×2 IMPLANT
NS IRRIG 1000ML POUR BTL (IV SOLUTION) ×2 IMPLANT
PACK TOTAL JOINT (CUSTOM PROCEDURE TRAY) ×2 IMPLANT
PACK UNIVERSAL I (CUSTOM PROCEDURE TRAY) ×2 IMPLANT
PAD ARMBOARD 7.5X6 YLW CONV (MISCELLANEOUS) ×4 IMPLANT
RETRACTOR WND ALEXIS 18 MED (MISCELLANEOUS) ×1 IMPLANT
RTRCTR WOUND ALEXIS 18CM MED (MISCELLANEOUS) ×2
STRIP CLOSURE SKIN 1/2X4 (GAUZE/BANDAGES/DRESSINGS) ×2 IMPLANT
SUT VIC AB 0 CT1 27 (SUTURE) ×2
SUT VIC AB 0 CT1 27XBRD ANBCTR (SUTURE) ×1 IMPLANT
SUT VIC AB 2-0 CT1 27 (SUTURE) ×2
SUT VIC AB 2-0 CT1 TAPERPNT 27 (SUTURE) ×1 IMPLANT
SUT VICRYL 4-0 PS2 18IN ABS (SUTURE) ×2 IMPLANT
SUT VLOC 180 0 24IN GS25 (SUTURE) ×2 IMPLANT
TOWEL OR 17X24 6PK STRL BLUE (TOWEL DISPOSABLE) ×2 IMPLANT
TOWEL OR 17X26 10 PK STRL BLUE (TOWEL DISPOSABLE) ×4 IMPLANT
TRAY CATH 16FR W/PLASTIC CATH (SET/KITS/TRAYS/PACK) IMPLANT
TRAY FOLEY W/METER SILVER 16FR (SET/KITS/TRAYS/PACK) ×2 IMPLANT
WATER STERILE IRR 1000ML POUR (IV SOLUTION) ×3 IMPLANT

## 2017-04-05 NOTE — Anesthesia Procedure Notes (Signed)
Procedure Name: MAC Date/Time: 04/05/2017 7:35 AM Performed by: Jenne Campus Pre-anesthesia Checklist: Patient identified, Emergency Drugs available, Suction available, Patient being monitored and Timeout performed Oxygen Delivery Method: Simple face mask

## 2017-04-05 NOTE — Interval H&P Note (Signed)
History and Physical Interval Note:  04/05/2017 7:24 AM  [name]  has presented today for surgery, with the diagnosis of Left Hip Osteoarthritis  The various methods of treatment have been discussed with the patient and family. After consideration of risks, benefits and other options for treatment, the patient has consented to  Procedure(s): LEFT TOTAL HIP ARTHROPLASTY ANTERIOR APPROACH (Left) as a surgical intervention .  The patient's history has been reviewed, patient examined, no change in status, stable for surgery.  I have reviewed the patient's chart and labs.  Questions were answered to the patient's satisfaction.     [name]

## 2017-04-05 NOTE — Anesthesia Postprocedure Evaluation (Signed)
Anesthesia Post Note  Patient: [name]  Procedure(s) Performed: Procedure(s) (LRB): LEFT TOTAL HIP ARTHROPLASTY ANTERIOR APPROACH (Left)     Patient location during evaluation: PACU Anesthesia Type: Spinal Level of consciousness: oriented and awake and alert Pain management: pain level controlled Vital Signs Assessment: post-procedure vital signs reviewed and stable Respiratory status: spontaneous breathing, respiratory function stable and patient connected to nasal cannula oxygen Cardiovascular status: blood pressure returned to baseline and stable Postop Assessment: no headache and no backache Anesthetic complications: no    Last Vitals:  Vitals:   04/05/17 1415 04/05/17 1953  BP:  (!) 93/54  Pulse:  (!) 58  Resp:  18  Temp: 36.7 C 36.4 C    Last Pain:  Vitals:   04/05/17 1953  TempSrc: Oral  PainSc:                  Bonita Brindisi P Muhammadali Ries

## 2017-04-05 NOTE — Op Note (Signed)
Preop diagnosis: Left hip primary osteoarthritis  Postop diagnosis: Same  Procedure: Left total hip arthroplasty direct anterior approach  Surgeon: Rodell Perna M.D.  Asst.: Benjiman Core PA-C medically necessary and present for the entire procedure  Second assist: Second scrub for retractors   Anesthesia: spinal  Implants Depuy  grip: 56 mm cup, #13 Corail stem, -2 mm metal head.  Procedure after induction of spinal anesthesia patient was placed on the [facility]  table. Abnormal was taped over C-arm was brought in for visualization both hips relatively visualized the floor was marked for C-arm and usual 10:15 drapes were applied DuraPrep sterile skin marker large shower curtain Betadine Steri-Drape application half sheet above long she across the opposite side. Timeout procedure completed Ancef was given prophylactically. Direct anterior approach was made fascia was opened elevated with a finger and then cover retractor placed of down deep medially over the capsule of the neck. Arterial bleeders were coagulated carefully. Capsule was opened anterior capsule was resected. Neck was cut under C-arm visualization 1 fingerbreadth above the lesser trochanter. With replacement tractors the neck cut was about the 2 mm too long and it was recut removing additional 2 mm. Sequential reaming of acetabulum initial visualization acetabulum after that had been removed with the difficulty due to patient's protrusio with inspection after removal it had there was a central defect in the acetabulum. Cancellous pieces of bone from the head and neck that a been removed for the packed into the central defect which was present due to the patient's severe protrusio. Sequential reaming touching just enough to get to the next size was performed until 55 reamer was used 56 cup was inserted we used the description cup with the 3 dome screw all options however the cup was extremely extremely secure it was impacted under C-arm  visualization for cup flexion and abduction. All central is was placed in cup was rocksolid. +4 no lip neutral liner was inserted. Polyethylene. Attention was then turned to the femur. Hydraulic cut was placed leg was taken down and under and then a preparation resecting the posterior capsule leaving the external rotators intact using the medial retractor medially lateralization and the large the Homan on the trochanter. There was good exposure progression up to a 13 broach trial sizer showed that the +1.5 mm was the not able be reduced the -2 mm was able be reduced. Neck cut looked good 11 mm neck cut above the lesser trochanter on on the C-arm measurements leg lengths were identical.  The stem was in placed impacted down onto the collar and -2 mm ball attached and with great difficulty hip was reduced. It measured 1 mm long on the the x-ray visualization with the line drawn across the initial tuberosity. Good stability stem was down the middle of the canal with good fit. Irrigation VueLock closure of the fascia subtendinous tissue root approximation skin closure postop dressing and transferred recovery room in stable condition.

## 2017-04-05 NOTE — Anesthesia Procedure Notes (Signed)
Spinal  Patient location during procedure: OR Start time: 04/05/2017 7:35 AM End time: 04/05/2017 7:45 AM Staffing Anesthesiologist: Adele Barthel P Performed: anesthesiologist  Preanesthetic Checklist Completed: patient identified, surgical consent, pre-op evaluation, timeout performed, IV checked, risks and benefits discussed and monitors and equipment checked Spinal Block Patient position: sitting Prep: DuraPrep Patient monitoring: cardiac monitor, continuous pulse ox and blood pressure Approach: midline Location: L3-4 Injection technique: single-shot Needle Needle type: Pencan  Needle gauge: 24 G Needle length: 9 cm Assessment Sensory level: T10 Additional Notes Functioning IV was confirmed and monitors were applied. Sterile prep and drape, including hand hygiene and sterile gloves were used. The patient was positioned and the spine was prepped. The skin was anesthetized with lidocaine.  Free flow of clear CSF was obtained prior to injecting local anesthetic into the CSF.  The spinal needle aspirated freely following injection.  The needle was carefully withdrawn.  The patient tolerated the procedure well.

## 2017-04-05 NOTE — Transfer of Care (Signed)
Immediate Anesthesia Transfer of Care Note  Patient: [name]  Procedure(s) Performed: Procedure(s): LEFT TOTAL HIP ARTHROPLASTY ANTERIOR APPROACH (Left)  Patient Location: PACU  Anesthesia Type:MAC and Spinal  Level of Consciousness: awake, oriented and patient cooperative  Airway & Oxygen Therapy: Patient Spontanous Breathing and Patient connected to face mask oxygen  Post-op Assessment: Report given to RN and Post -op Vital signs reviewed and stable  Post vital signs: Reviewed  Last Vitals:  Vitals:   04/05/17 0552 04/05/17 1015  BP: 106/64   Pulse: (!) 58   Resp: 20   Temp: 36.9 C (P) 36.3 C    Last Pain:  Vitals:   04/05/17 1015  TempSrc:   PainSc: (P) 9       Patients Stated Pain Goal: 2 (83/38/25 0539)  Complications: No apparent anesthesia complications

## 2017-04-05 NOTE — Anesthesia Preprocedure Evaluation (Addendum)
Anesthesia Evaluation  Patient identified by MRN, date of birth, ID band Patient awake    Reviewed: Allergy & Precautions, NPO status , Patient's Chart, lab work & pertinent test results, reviewed documented beta blocker date and time   Airway Mallampati: II  TM Distance: >3 FB Neck ROM: Full    Dental  (+) Upper Dentures, Lower Dentures, Dental Advisory Given   Pulmonary neg pulmonary ROS,  Right middle lobectomy   Pulmonary exam normal breath sounds clear to auscultation       Cardiovascular hypertension, Pt. on home beta blockers and Pt. on medications Normal cardiovascular exam Rhythm:Regular Rate:Normal  ECG: NSR, rate 76  ECHO: - Left ventricle: The cavity size was normal. Systolic function was normal. The estimated ejection fraction was in the range of 60%   to 65%. Wall motion was normal; there were no regional wall motion abnormalities. Features are consistent with a pseudonormal left ventricular filling pattern, with concomitant abnormal   relaxation and increased filling pressure (grade 2 diastolic dysfunction). - Aortic valve: There was very mild stenosis. Valve area (VTI):   2.01 cm^2. Valve area (Vmax): 2.09 cm^2. Valve area (Vmean): 1.96   cm^2. - Mitral valve: Calcified annulus. - Left atrium: The atrium was mildly dilated. - Pulmonary arteries: Systolic pressure was mildly to moderately increased. PA peak pressure: 46 mm Hg (S).  Per Ellyn Hack: With relatively recent echocardiogram done within a year because of abnormalities on her EKG, patient does not require any further cardiac evaluation for her hip surgery.   Neuro/Psych negative neurological ROS  negative psych ROS   GI/Hepatic Neg liver ROS,   Endo/Other  Hypothyroidism   Renal/GU negative Renal ROS  negative genitourinary   Musculoskeletal  (+) Arthritis , Osteoarthritis,    Abdominal (+) + obese,   Peds negative pediatric ROS (+)   Hematology  (+) anemia ,   Anesthesia Other Findings Obese   Reproductive/Obstetrics negative OB ROS                           Anesthesia Physical Anesthesia Plan  ASA: II  Anesthesia Plan: Spinal   Post-op Pain Management:    Induction: Intravenous  PONV Risk Score and Plan: 2 and Ondansetron, Dexamethasone and Propofol  Airway Management Planned:   Additional Equipment:   Intra-op Plan:   Post-operative Plan:   Informed Consent: I have reviewed the patients History and Physical, chart, labs and discussed the procedure including the risks, benefits and alternatives for the proposed anesthesia with the patient or authorized representative who has indicated his/her understanding and acceptance.   Dental advisory given  Plan Discussed with: CRNA  Anesthesia Plan Comments:         Anesthesia Quick Evaluation

## 2017-04-06 LAB — BASIC METABOLIC PANEL
Anion gap: 5 (ref 5–15)
BUN: 11 mg/dL (ref 6–20)
CO2: 27 mmol/L (ref 22–32)
Calcium: 8.5 mg/dL — ABNORMAL LOW (ref 8.9–10.3)
Chloride: 102 mmol/L (ref 101–111)
Creatinine, Ser: 0.71 mg/dL (ref 0.44–1.00)
GFR calc Af Amer: >60 mL/min (ref >60)
GFR calc non Af Amer: >60 mL/min (ref >60)
Glucose, Bld: 134 mg/dL — ABNORMAL HIGH (ref 65–99)
Potassium: 4.5 mmol/L (ref 3.5–5.1)
Sodium: 134 mmol/L — ABNORMAL LOW (ref 135–145)

## 2017-04-06 LAB — CBC
HCT: 25 % — ABNORMAL LOW (ref 36.0–46.0)
Hemoglobin: 8.6 g/dL — ABNORMAL LOW (ref 12.0–15.0)
MCH: 27.7 pg (ref 26.0–34.0)
MCHC: 34.4 g/dL (ref 30.0–36.0)
MCV: 80.4 fL (ref 78.0–100.0)
Platelets: 248 10*3/uL (ref 150–400)
RBC: 3.11 MIL/uL — ABNORMAL LOW (ref 3.87–5.11)
RDW: 14.2 % (ref 11.5–15.5)
WBC: 9 10*3/uL (ref 4.0–10.5)

## 2017-04-06 NOTE — Progress Notes (Signed)
   Subjective:  Patient reports pain as mild.  Doing fine.  No events.  Objective:   VITALS:   Vitals:   04/05/17 1415 04/05/17 1953 04/06/17 0057 04/06/17 0527  BP:  (!) 93/54 (!) 119/59 117/64  Pulse:  (!) 58 65 61  Resp:  18 18 18   Temp: 98 F (36.7 C) 97.6 F (36.4 C) 98.3 F (36.8 C) 98.2 F (36.8 C)  TempSrc:  Oral Oral Oral  SpO2:  98% 96% 96%  Weight:      Height:        Neurologically intact Neurovascular intact Sensation intact distally Intact pulses distally Dorsiflexion/Plantar flexion intact Incision: dressing C/D/I and no drainage No cellulitis present Compartment soft   Lab Results  Component Value Date   WBC 9.0 04/06/2017   HGB 8.6 (L) 04/06/2017   HCT 25.0 (L) 04/06/2017   MCV 80.4 04/06/2017   PLT 248 04/06/2017     Assessment/Plan:  1 Day Post-Op   - Expected postop acute blood loss anemia - will monitor for symptoms - Up with PT/OT - DVT ppx - SCDs, ambulation, aspirin - WBAT operative extremity - Pain control - Discharge planning - home sun or Monday with HHPT  Eduard Roux 04/06/2017, 12:28 PM [phone]

## 2017-04-06 NOTE — Evaluation (Signed)
Physical Therapy Evaluation Patient Details Name: [name] MRN: [id number] DOB: 08/14/1933 Today's Date: 04/06/2017   History of Present Illness  Pt is a 83 yo female with complaints of L hip pain secondary to end stage L hip OA, s/p L THA 04/06/17. PMH significant for HTN, dyspnea, partial R lung removal, back pain and OA   Clinical Impression  Patient is s/p above surgery resulting in functional limitations due to the deficits listed below (see PT Problem List). Pt is currently minA for bed mobility, transfers and ambulation of 12 feet with RW. Pt is limited by pain. Patient will benefit from skilled PT to increase their independence and safety with mobility to allow discharge to the venue listed below.       Follow Up Recommendations Home health PT;Supervision/Assistance - 24 hour    Equipment Recommendations  None recommended by PT    Recommendations for Other Services       Precautions / Restrictions Precautions Precautions: Anterior Hip Precaution Booklet Issued: Yes (comment) Restrictions Weight Bearing Restrictions: Yes LLE Weight Bearing: Weight bearing as tolerated      Mobility  Bed Mobility Overal bed mobility: Needs Assistance Bed Mobility: Supine to Sit     Supine to sit: Min assist     General bed mobility comments: minA for LE management and pad scoot of hips to EoB  Transfers Overall transfer level: Needs assistance Equipment used: Rolling walker (2 wheeled) Transfers: Sit to/from Stand Sit to Stand: Mod assist;Min assist         General transfer comment: modA for initial sit>stand and minA for subsequent sit>stand from [facility]   Ambulation/Gait Ambulation/Gait assistance: Min assist Ambulation Distance (Feet): 12 Feet Assistive device: Rolling walker (2 wheeled) Gait Pattern/deviations: Step-to pattern;Decreased weight shift to left;Decreased stance time - left;Decreased step length - right Gait velocity: slowed Gait velocity interpretation:  Below normal speed for age/gender General Gait Details: minA for steading vc for staying inside RW, and upright posture.       Balance Overall balance assessment: Needs assistance Sitting-balance support: No upper extremity supported;Feet supported Sitting balance-Leahy Scale: Fair     Standing balance support: Bilateral upper extremity supported Standing balance-Leahy Scale: Poor Standing balance comment: requires RW assist                             Pertinent Vitals/Pain Pain Assessment: 0-10 Pain Score: 9  Pain Location: L hamstring and hip Pain Descriptors / Indicators: Aching;Burning;Constant Pain Intervention(s): Premedicated before session;Monitored during session;Limited activity within patient's tolerance  VSS    Home Living Family/patient expects to be discharged to:: Private residence Living Arrangements: Alone Available Help at Discharge: Family;Available 24 hours/day Type of Home: House Home Access: Stairs to enter Entrance Stairs-Rails: Right Entrance Stairs-Number of Steps: 3 Home Layout: Multi-level;Able to live on main level with bedroom/bathroom Home Equipment: Bedside commode;Walker - 2 wheels;Tub bench;Walker - 4 wheels      Prior Function Level of Independence: Independent with assistive device(s)         Comments: with rollator community ambulator and driver        Extremity/Trunk Assessment   Upper Extremity Assessment Upper Extremity Assessment: Overall WFL for tasks assessed    Lower Extremity Assessment Lower Extremity Assessment: LLE deficits/detail LLE Deficits / Details: L hip and knee ROM and strength limited by surgical pain from L THA LLE: Unable to fully assess due to pain    Cervical / Trunk Assessment  Cervical / Trunk Assessment: Kyphotic  Communication   Communication: No difficulties  Cognition Arousal/Alertness: Awake/alert Behavior During Therapy: WFL for tasks assessed/performed Overall Cognitive  Status: Within Functional Limits for tasks assessed                                        General Comments General comments (skin integrity, edema, etc.): Daughter in room throughout session        Assessment/Plan    PT Assessment Patient needs continued PT services  PT Problem List Decreased strength;Decreased range of motion;Decreased activity tolerance;Decreased balance;Decreased mobility;Decreased knowledge of use of DME;Pain       PT Treatment Interventions DME instruction;Gait training;Stair training;Functional mobility training;Therapeutic activities;Therapeutic exercise;Balance training;Patient/family education    PT Goals (Current goals can be found in the Care Plan section)  Acute Rehab PT Goals Patient Stated Goal: have less pain PT Goal Formulation: With patient Time For Goal Achievement: 04/13/17 Potential to Achieve Goals: Good    Frequency 7X/week    AM-PAC PT "6 Clicks" Daily Activity  Outcome Measure Difficulty turning over in bed (including adjusting bedclothes, sheets and blankets)?: Total Difficulty moving from lying on back to sitting on the side of the bed? : Total Difficulty sitting down on and standing up from a chair with arms (e.g., wheelchair, bedside commode, etc,.)?: Total Help needed moving to and from a bed to chair (including a wheelchair)?: A Little Help needed walking in hospital room?: A Little Help needed climbing 3-5 steps with a railing? : Total 6 Click Score: 10    End of Session Equipment Utilized During Treatment: Gait belt Activity Tolerance: Patient limited by pain Patient left: in chair;with call bell/phone within reach;with family/visitor present Nurse Communication: Mobility status;Weight bearing status PT Visit Diagnosis: Unsteadiness on feet (R26.81);Other abnormalities of gait and mobility (R26.89);Muscle weakness (generalized) (M62.81);Difficulty in walking, not elsewhere classified (R26.2);Pain Pain -  Right/Left: Left Pain - part of body: Hip    Time: 7416-3845 PT Time Calculation (min) (ACUTE ONLY): 34 min   Charges:   PT Evaluation $PT Eval Low Complexity: 1 Procedure PT Treatments $Therapeutic Activity: 8-22 mins   PT G Codes:        Hannelore Bova B. Migdalia Dk PT, DPT Acute Rehabilitation  [phone] Pager [phone]    [place] 04/06/2017, 1:30 PM

## 2017-04-06 NOTE — Progress Notes (Signed)
Physical Therapy Treatment Patient Details Name: [name] MRN: [id number] DOB: 09-Aug-1933 Today's Date: 04/06/2017    History of Present Illness Pt is a 83 yo female with complaints of L hip pain secondary to end stage L hip OA, s/p L THA 04/06/17. PMH significant for HTN, dyspnea, partial R lung removal, back pain and OA     PT Comments    Pt is making slow progress towards her goal this afternoon and is limited by increased pain. Pt is minA for bed mobility, transfers and 2 feet ambulation to get back in bed. Pt requires skilled PT to progress gait training and to improve LE strength and endurance to safely mobilize in her discharge environment.     Follow Up Recommendations  Home health PT;Supervision/Assistance - 24 hour     Equipment Recommendations  None recommended by PT       Precautions / Restrictions Precautions Precautions: Anterior Hip Precaution Booklet Issued: Yes (comment) Restrictions Weight Bearing Restrictions: Yes LLE Weight Bearing: Weight bearing as tolerated    Mobility  Bed Mobility Overal bed mobility: Needs Assistance Bed Mobility: Sit to Supine     Supine to sit: Min assist Sit to supine: Min assist   General bed mobility comments: minA for LE management back into the bed  Transfers Overall transfer level: Needs assistance Equipment used: Rolling walker (2 wheeled) Transfers: Sit to/from [facility] Sit to Stand: Min assist Stand pivot transfers: Min assist       General transfer comment: minA for steadying with stand pivot transfer to [facility] and sit<>stand  vc for hand placement  Ambulation/Gait Ambulation/Gait assistance: Min assist Ambulation Distance (Feet): 2 Feet Assistive device: Rolling walker (2 wheeled);1 person hand held assist Gait Pattern/deviations: Step-to pattern;Decreased weight shift to left;Decreased stance time - left;Decreased step length - right;Shuffle Gait velocity: slowed Gait velocity  interpretation: Below normal speed for age/gender General Gait Details: minA for steading with HHA to get from chair to bed vc for picking up L LE for gait       Balance Overall balance assessment: Needs assistance Sitting-balance support: No upper extremity supported;Feet supported Sitting balance-Leahy Scale: Fair     Standing balance support: Bilateral upper extremity supported Standing balance-Leahy Scale: Poor Standing balance comment: requires minA for steadying                            Cognition Arousal/Alertness: Awake/alert Behavior During Therapy: WFL for tasks assessed/performed Overall Cognitive Status: Within Functional Limits for tasks assessed                                        Exercises General Exercises - Lower Extremity Long Arc Quad: AROM;Left;10 reps;Seated    General Comments General comments (skin integrity, edema, etc.): Pt started with seated exercise and then requested to transfer to [facility]. Pt became too painful with transfer and requested to get back in bed and end session for the day. RN to follow up with pain medication.       Pertinent Vitals/Pain Pain Assessment: 0-10 Pain Score: 8  Pain Location: L hamstring and hip Pain Descriptors / Indicators: Aching;Burning;Constant Pain Intervention(s): Monitored during session;Limited activity within patient's tolerance  VSS    Home Living Family/patient expects to be discharged to:: Private residence Living Arrangements: Alone Available Help at Discharge: Family;Available 24 hours/day Type of Home:  House Home Access: Stairs to enter Entrance Stairs-Rails: Right Home Layout: Multi-level;Able to live on main level with bedroom/bathroom Home Equipment: Bedside commode;Walker - 2 wheels;Tub bench;Walker - 4 wheels      Prior Function Level of Independence: Independent with assistive device(s)      Comments: with rollator community ambulator and driver   PT Goals  (current goals can now be found in the care plan section) Acute Rehab PT Goals Patient Stated Goal: have less pain PT Goal Formulation: With patient Time For Goal Achievement: 04/13/17 Potential to Achieve Goals: Good    Frequency    7X/week      PT Plan Current plan remains appropriate       AM-PAC PT "6 Clicks" Daily Activity  Outcome Measure  Difficulty turning over in bed (including adjusting bedclothes, sheets and blankets)?: Total Difficulty moving from lying on back to sitting on the side of the bed? : Total Difficulty sitting down on and standing up from a chair with arms (e.g., wheelchair, bedside commode, etc,.)?: Total Help needed moving to and from a bed to chair (including a wheelchair)?: A Little Help needed walking in hospital room?: A Little Help needed climbing 3-5 steps with a railing? : Total 6 Click Score: 10    End of Session Equipment Utilized During Treatment: Gait belt Activity Tolerance: Patient limited by pain Patient left: in chair;with call bell/phone within reach;with family/visitor present Nurse Communication: Mobility status;Weight bearing status PT Visit Diagnosis: Unsteadiness on feet (R26.81);Other abnormalities of gait and mobility (R26.89);Muscle weakness (generalized) (M62.81);Difficulty in walking, not elsewhere classified (R26.2);Pain Pain - Right/Left: Left Pain - part of body: Hip     Time: 6286-3817 PT Time Calculation (min) (ACUTE ONLY): 13 min  Charges:  $Therapeutic Activity: 8-22 mins                    G Codes:       Trevar Boehringer B. Migdalia Dk PT, DPT Acute Rehabilitation  [phone] Pager [phone]     [place] 04/06/2017, 3:36 PM

## 2017-04-07 LAB — CBC
HCT: 22.4 % — ABNORMAL LOW (ref 36.0–46.0)
Hemoglobin: 7.8 g/dL — ABNORMAL LOW (ref 12.0–15.0)
MCH: 27.7 pg (ref 26.0–34.0)
MCHC: 34.8 g/dL (ref 30.0–36.0)
MCV: 79.4 fL (ref 78.0–100.0)
Platelets: 227 10*3/uL (ref 150–400)
RBC: 2.82 MIL/uL — ABNORMAL LOW (ref 3.87–5.11)
RDW: 14.2 % (ref 11.5–15.5)
WBC: 7.6 10*3/uL (ref 4.0–10.5)

## 2017-04-07 LAB — HEMOGLOBIN AND HEMATOCRIT, BLOOD
HCT: 27 % — ABNORMAL LOW (ref 36.0–46.0)
Hemoglobin: 9.4 g/dL — ABNORMAL LOW (ref 12.0–15.0)

## 2017-04-07 LAB — PREPARE RBC (CROSSMATCH)

## 2017-04-07 MED ORDER — SODIUM CHLORIDE 0.9 % IV SOLN
Freq: Once | INTRAVENOUS | Status: AC
  Administered 2017-04-07: 20 mL via INTRAVENOUS

## 2017-04-07 NOTE — Progress Notes (Signed)
Physical Therapy Treatment Patient Details Name: [name] MRN: [id number] DOB: 1933/09/04 Today's Date: 04/07/2017    History of Present Illness Pt is a 83 yo female with complaints of L hip pain secondary to end stage L hip OA, s/p L THA 04/06/17. PMH significant for HTN, dyspnea, partial R lung removal, back pain and OA     PT Comments    Pt performed increased gait during session with max VCs for encouragement.  Pt transferred back to bed post session for blood transfusion.  RNs in room ready to hang blood products therefore, therapeutic exercise deferred.  Will f/u in pm if time permits.     Follow Up Recommendations  Home health PT;Supervision/Assistance - 24 hour     Equipment Recommendations  None recommended by PT    Recommendations for Other Services       Precautions / Restrictions Precautions Precautions: Anterior Hip Precaution Booklet Issued: Yes (comment) Restrictions Weight Bearing Restrictions: Yes LLE Weight Bearing: Weight bearing as tolerated    Mobility  Bed Mobility Overal bed mobility: Needs Assistance Bed Mobility: Supine to Sit;Sit to Supine     Supine to sit: Min assist Sit to supine: Min assist   General bed mobility comments: minA for LE management into and out of the bed  Transfers Overall transfer level: Needs assistance Equipment used: Rolling walker (2 wheeled) Transfers: Sit to/from [facility] Sit to Stand: Supervision         General transfer comment: Cues for safety with hand placement to and from seated surface.   Ambulation/Gait Ambulation/Gait assistance: Min assist Ambulation Distance (Feet): 120 Feet Assistive device: Rolling walker (2 wheeled) Gait Pattern/deviations: Decreased weight shift to left;Decreased stance time - left;Decreased step length - right;Shuffle;Step-through pattern Gait velocity: slowed   General Gait Details: Cues for UE use and cues for B step through sequencing.  x2 standing  rest breaks due to fatigue.     Stairs            Wheelchair Mobility    Modified Rankin (Stroke Patients Only)       Balance Overall balance assessment: Needs assistance Sitting-balance support: No upper extremity supported;Feet supported Sitting balance-Leahy Scale: Fair       Standing balance-Leahy Scale: Poor Standing balance comment: requires minA for steadying                            Cognition Arousal/Alertness: Awake/alert Behavior During Therapy: WFL for tasks assessed/performed Overall Cognitive Status: Within Functional Limits for tasks assessed                                        Exercises      General Comments        Pertinent Vitals/Pain Pain Assessment: 0-10 Pain Score: 8  Pain Location: L hamstring and hip, R shoulder Pain Descriptors / Indicators: Aching;Burning;Constant Pain Intervention(s): Monitored during session;Repositioned    Home Living                      Prior Function            PT Goals (current goals can now be found in the care plan section) Acute Rehab PT Goals Potential to Achieve Goals: Good Progress towards PT goals: Progressing toward goals    Frequency    7X/week  PT Plan Current plan remains appropriate    Co-evaluation              AM-PAC PT "6 Clicks" Daily Activity  Outcome Measure  Difficulty turning over in bed (including adjusting bedclothes, sheets and blankets)?: Total Difficulty moving from lying on back to sitting on the side of the bed? : Total Difficulty sitting down on and standing up from a chair with arms (e.g., wheelchair, bedside commode, etc,.)?: Total Help needed moving to and from a bed to chair (including a wheelchair)?: A Little Help needed walking in hospital room?: A Little Help needed climbing 3-5 steps with a railing? : Total 6 Click Score: 10    End of Session Equipment Utilized During Treatment: Gait belt Activity  Tolerance: Patient limited by pain Patient left: in chair;with call bell/phone within reach;with family/visitor present Nurse Communication: Mobility status;Weight bearing status PT Visit Diagnosis: Unsteadiness on feet (R26.81);Other abnormalities of gait and mobility (R26.89);Muscle weakness (generalized) (M62.81);Difficulty in walking, not elsewhere classified (R26.2);Pain Pain - Right/Left: Left Pain - part of body: Hip     Time: 1209-1233 PT Time Calculation (min) (ACUTE ONLY): 24 min  Charges:  $Gait Training: 8-22 mins $Therapeutic Activity: 8-22 mins                    G Codes:       Governor Rooks, PTA pager [phone]    Cristela Blue 04/07/2017, 1:41 PM

## 2017-04-07 NOTE — Progress Notes (Signed)
   Subjective:  Patient reports pain as mild.  Doing fine.  No events.  Objective:   VITALS:   Vitals:   04/06/17 0527 04/06/17 1900 04/06/17 2157 04/07/17 0602  BP: 117/64 (!) 103/57 (!) 94/50 (!) 112/49  Pulse: 61 (!) 56 69 72  Resp: 18 18 20 18   Temp: 98.2 F (36.8 C) 98.8 F (37.1 C) 99.4 F (37.4 C) 99.4 F (37.4 C)  TempSrc: Oral Oral Oral Oral  SpO2: 96% 100% 96% 94%  Weight:      Height:        Neurologically intact Neurovascular intact Sensation intact distally Intact pulses distally Dorsiflexion/Plantar flexion intact Incision: dressing C/D/I and no drainage No cellulitis present Compartment soft   Lab Results  Component Value Date   WBC 7.6 04/07/2017   HGB 7.8 (L) 04/07/2017   HCT 22.4 (L) 04/07/2017   MCV 79.4 04/07/2017   PLT 227 04/07/2017     Assessment/Plan:  2 Days Post-Op   - Expected postop acute blood loss anemia - will monitor for symptoms - will transfuse 1 unit RBC - Up with PT/OT - DVT ppx - SCDs, ambulation, aspirin - WBAT operative extremity - Pain control - reluctant to take pain meds which is slowing her progress with PT - Discharge planning - home Monday with HHPT  Eduard Roux 04/07/2017, 8:05 AM [phone]

## 2017-04-08 ENCOUNTER — Encounter (HOSPITAL_COMMUNITY): Payer: Self-pay | Admitting: Orthopaedic Surgery

## 2017-04-08 LAB — CBC
HCT: 23.8 % — ABNORMAL LOW (ref 36.0–46.0)
Hemoglobin: 8.3 g/dL — ABNORMAL LOW (ref 12.0–15.0)
MCH: 28 pg (ref 26.0–34.0)
MCHC: 34.9 g/dL (ref 30.0–36.0)
MCV: 80.4 fL (ref 78.0–100.0)
Platelets: 231 10*3/uL (ref 150–400)
RBC: 2.96 MIL/uL — ABNORMAL LOW (ref 3.87–5.11)
RDW: 14.1 % (ref 11.5–15.5)
WBC: 8.5 10*3/uL (ref 4.0–10.5)

## 2017-04-08 LAB — TYPE AND SCREEN
ABO/RH(D): A POS
Antibody Screen: NEGATIVE
Unit division: 0

## 2017-04-08 LAB — BPAM RBC
Blood Product Expiration Date: 201807042359
ISSUE DATE / TIME: 201807011209
Unit Type and Rh: 6200

## 2017-04-08 MED ORDER — FERROUS SULFATE 325 (65 FE) MG PO TABS: 325.0000 mg | ORAL_TABLET | Freq: Two times a day (BID) | ORAL | 1 refills | Status: DC

## 2017-04-08 MED ORDER — ASPIRIN 325 MG PO TBEC: 325.0000 mg | DELAYED_RELEASE_TABLET | Freq: Every day | ORAL | 0 refills | Status: DC

## 2017-04-08 MED ORDER — HYDROCODONE-ACETAMINOPHEN 7.5-325 MG PO TABS: 1.0000 | ORAL_TABLET | Freq: Four times a day (QID) | ORAL | 0 refills | Status: DC | PRN

## 2017-04-08 NOTE — Discharge Instructions (Signed)

## 2017-04-08 NOTE — Care Management Note (Addendum)
Case Management Note  Patient Details  Name: [name] MRN: [id number] Date of Birth: [birth year]  Subjective/Objective:   Pleasant  83 yr old female , s/p left total hip arthroplasty.                 Action/Plan: Case manager spoke with patient and her daughter concerning discharge plan and DME. Choice was offered for [place]. Referral was called to [place], [facility] Liaison. Patient says she has RW and 3in1 at home. Her daughter and other family members will assist her at discharge.   Expected Discharge Date:  04/08/17               Expected Discharge Plan:  [place]  In-House Referral:  NA  Discharge planning Services  CM Consult  Post Acute Care Choice:  Home Health Choice offered to:  Patient, Adult Children  DME Arranged:  (Has RW and 3in1).  DME Agency:  NA  HH Arranged:   PT HH Agency:  [facility]  Status of Service:  Completed, signed off  If discussed at [place] of Stay Meetings, dates discussed:    Additional Comments:  Ninfa Meeker, RN 04/08/2017, 1:03 PM

## 2017-04-08 NOTE — Progress Notes (Signed)
   Subjective: 3 Days Post-Op Procedure(s) (LRB): LEFT TOTAL HIP ARTHROPLASTY ANTERIOR APPROACH (Left) Patient reports pain as mild and moderate.    Objective: Vital signs in last 24 hours: Temp:  [98.2 F (36.8 C)-99.7 F (37.6 C)] 98.9 F (37.2 C) (07/02 0542) Pulse Rate:  [63-85] 85 (07/02 0542) Resp:  [18-20] 18 (07/02 0542) BP: (90-129)/(5-72) 127/69 (07/02 0542) SpO2:  [94 %-98 %] 97 % (07/02 0542)  Intake/Output from previous day: 07/01 0701 - 07/02 0700 In: 818.3 [P.O.:482; Blood:336.3] Out: 200 [Urine:200] Intake/Output this shift: No intake/output data recorded.   Recent Labs  04/06/17 0407 04/07/17 0350 04/07/17 1847 04/08/17 0447  HGB 8.6* 7.8* 9.4* 8.3*    Recent Labs  04/07/17 0350 04/07/17 1847 04/08/17 0447  WBC 7.6  --  8.5  RBC 2.82*  --  2.96*  HCT 22.4* 27.0* 23.8*  PLT 227  --  231    Recent Labs  04/06/17 0407  NA 134*  K 4.5  CL 102  CO2 27  BUN 11  CREATININE 0.71  GLUCOSE 134*  CALCIUM 8.5*   No results for input(s): LABPT, INR in the last 72 hours.  Neurologically intact No results found.  Assessment/Plan: 3 Days Post-Op Procedure(s) (LRB): LEFT TOTAL HIP ARTHROPLASTY ANTERIOR APPROACH (Left) Discharge home with home health. Office one week  [name] 04/08/2017, 12:08 PM

## 2017-04-08 NOTE — Progress Notes (Signed)
Physical Therapy Treatment Patient Details Name: [name] MRN: [id number] DOB: 02-04-1933 Today's Date: 04/08/2017    History of Present Illness Pt is a 83 yo female with complaints of L hip pain secondary to end stage L hip OA, s/p L THA 04/06/17. PMH significant for HTN, dyspnea, partial R lung removal, back pain and OA     PT Comments    Pt performed decreased gait secondary to L quad pain.  Pt required cues for safety with RW.  Daughter present to observe.  Pt performed supine exercises during session and reports decreased pain.  Will f/u in pm for stair training with plan to return home later this afternoon.     Follow Up Recommendations  Home health PT;Supervision/Assistance - 24 hour     Equipment Recommendations  None recommended by PT    Recommendations for Other Services       Precautions / Restrictions Precautions Precautions: Anterior Hip Precaution Booklet Issued: Yes (comment) Restrictions Weight Bearing Restrictions: Yes LLE Weight Bearing: Weight bearing as tolerated    Mobility  Bed Mobility Overal bed mobility: Needs Assistance             General bed mobility comments: Pt sitting in recliner on arrival.    Transfers Overall transfer level: Needs assistance Equipment used: Rolling walker (2 wheeled) Transfers: Sit to/from Stand Sit to Stand: Supervision;Min guard         General transfer comment: Cues for safety with hand placement to and from seated surface.   Ambulation/Gait Ambulation/Gait assistance: Supervision Ambulation Distance (Feet): 80 Feet Assistive device: Rolling walker (2 wheeled) Gait Pattern/deviations: Decreased weight shift to left;Decreased stance time - left;Decreased step length - right;Shuffle;Step-through pattern Gait velocity: slowed   General Gait Details: Cues for UE use and cues for B step through sequencing.  x2 standing rest breaks due to fatigue.  Cues to keep hand on RW hand grip vs reaching for rails and  counters in the halls.     Stairs Stairs: Yes          Wheelchair Mobility    Modified Rankin (Stroke Patients Only)       Balance Overall balance assessment: Needs assistance Sitting-balance support: No upper extremity supported;Feet supported Sitting balance-Leahy Scale: Fair     Standing balance support: Bilateral upper extremity supported Standing balance-Leahy Scale: Poor Standing balance comment: requires minA for steadying                            Cognition Arousal/Alertness: Awake/alert Behavior During Therapy: WFL for tasks assessed/performed Overall Cognitive Status: Within Functional Limits for tasks assessed                                        Exercises Total Joint Exercises Ankle Circles/Pumps: AROM;Both;10 reps;Supine Quad Sets: AROM;Left;10 reps;Supine Short Arc Quad: AROM;Left;10 reps;Supine Heel Slides: Left;10 reps;Supine;AAROM Hip ABduction/ADduction: Left;10 reps;Supine;AAROM Other Exercises Other Exercises: quad stretching at end range of each heel slide 5-10 secs.      General Comments        Pertinent Vitals/Pain Pain Assessment: 0-10 Pain Score: 8  Pain Location: L quad Pain Descriptors / Indicators: Tightness;Sore;Heaviness Pain Intervention(s): Monitored during session;Repositioned;Ice applied    Home Living                      Prior Function  PT Goals (current goals can now be found in the care plan section) Acute Rehab PT Goals Patient Stated Goal: have less pain Potential to Achieve Goals: Good Progress towards PT goals: Progressing toward goals    Frequency    7X/week      PT Plan Current plan remains appropriate    Co-evaluation              AM-PAC PT "6 Clicks" Daily Activity  Outcome Measure  Difficulty turning over in bed (including adjusting bedclothes, sheets and blankets)?: Total Difficulty moving from lying on back to sitting on the side  of the bed? : Total Difficulty sitting down on and standing up from a chair with arms (e.g., wheelchair, bedside commode, etc,.)?: A Lot Help needed moving to and from a bed to chair (including a wheelchair)?: A Little Help needed walking in hospital room?: A Little Help needed climbing 3-5 steps with a railing? : A Little 6 Click Score: 13    End of Session Equipment Utilized During Treatment: Gait belt Activity Tolerance: Patient limited by pain Patient left: in chair;with call bell/phone within reach;with family/visitor present Nurse Communication: Mobility status;Weight bearing status PT Visit Diagnosis: Unsteadiness on feet (R26.81);Other abnormalities of gait and mobility (R26.89);Muscle weakness (generalized) (M62.81);Difficulty in walking, not elsewhere classified (R26.2);Pain Pain - Right/Left: Left Pain - part of body: Hip     Time: 1120-1203 PT Time Calculation (min) (ACUTE ONLY): 43 min  Charges:  $Gait Training: 8-22 mins $Therapeutic Exercise: 8-22 mins $Therapeutic Activity: 8-22 mins                    G Codes:       Governor Rooks, PTA pager [phone]    Cristela Blue 04/08/2017, 1:00 PM

## 2017-04-08 NOTE — Progress Notes (Signed)
Pt discharged via w/c accompanied by daughter after d/c instructions and prescriptions given. All questions answered.

## 2017-04-08 NOTE — Progress Notes (Signed)
Physical Therapy Treatment Patient Details Name: [name] MRN: [id number] DOB: 04-Sep-1933 Today's Date: 04/08/2017    History of Present Illness Pt is a 83 yo female with complaints of L hip pain secondary to end stage L hip OA, s/p L THA 04/06/17. PMH significant for HTN, dyspnea, partial R lung removal, back pain and OA     PT Comments    Pt performed gait, stair negotiation and review of HEP in prep for d/c home.  Pt required cues for safety and daughter present to observe session to provide assistance at home.  Progressed patient to standing exercises and she struggled completing these exercises.  Educated patient and daughter to perform supine and seated only at home for safety and to try standing exercises with her HHPT.  Pt is ready to d/c home from a mobility stand point.     Follow Up Recommendations  Home health PT;Supervision/Assistance - 24 hour     Equipment Recommendations  None recommended by PT    Recommendations for Other Services       Precautions / Restrictions Precautions Precautions: None Precaution Booklet Issued: No Precaution Comments: HEP issued.   Restrictions Weight Bearing Restrictions: Yes LLE Weight Bearing: Weight bearing as tolerated    Mobility  Bed Mobility Overal bed mobility: Needs Assistance             General bed mobility comments: Pt sitting in recliner on arrival.    Transfers Overall transfer level: Needs assistance Equipment used: Rolling walker (2 wheeled) Transfers: Sit to/from Stand Sit to Stand: Supervision;Min guard         General transfer comment: Cues for safety with hand placement to and from seated surface.   Ambulation/Gait Ambulation/Gait assistance: Supervision Ambulation Distance (Feet): 80 Feet Assistive device: Rolling walker (2 wheeled) Gait Pattern/deviations: Decreased weight shift to left;Decreased stance time - left;Decreased step length - right;Shuffle;Step-through pattern Gait velocity:  slowed Gait velocity interpretation: Below normal speed for age/gender General Gait Details: Cues for UE use and cues for B step through sequencing.  x2 standing rest breaks due to fatigue.  Cues to keep hand on RW hand grip vs reaching for rails and counters in the halls.     Stairs Stairs: Yes   Stair Management: One rail Right;Step to pattern;Backwards;Forwards Number of Stairs: 4 General stair comments: Forward to ascend and backward to descend.  Pt required cues for sequencing and use of hand rail.  Educated daughter to have RW waiting at the top or bottom for safe transition to walking after completing stairs.    Wheelchair Mobility    Modified Rankin (Stroke Patients Only)       Balance Overall balance assessment: Needs assistance Sitting-balance support: No upper extremity supported;Feet supported Sitting balance-Leahy Scale: Fair     Standing balance support: Bilateral upper extremity supported Standing balance-Leahy Scale: Poor Standing balance comment: requires minA for steadying                            Cognition Arousal/Alertness: Awake/alert Behavior During Therapy: WFL for tasks assessed/performed Overall Cognitive Status: Within Functional Limits for tasks assessed                                        Exercises Total Joint Exercises Ankle Circles/Pumps: AROM;Both;10 reps;Supine Quad Sets: AROM;Left;10 reps;Supine Short Arc Quad: AROM;Left;10 reps;Supine Heel Slides:  Left;10 reps;Supine;AAROM Hip ABduction/ADduction: Left;10 reps;Supine;AAROM (Performed in standing 1x10 in addition to supine with min assistance to maintain balance.  ) Long Arc Quad: AROM;Left;10 reps;Seated Knee Flexion: AROM;Left;10 reps;Standing (min assist to maintain balance) Marching in Standing: AAROM;Left;10 reps;Standing (Pt required assistance to physically advance LLE in direction of hip flexion due to weakness.  ) Standing Hip Extension:  AAROM;Left;10 reps;Standing (min assistance to maintain balance.  ) Other Exercises Other Exercises: quad stretching at end range of each heel slide 5-10 secs.      General Comments        Pertinent Vitals/Pain Pain Assessment: 0-10 Pain Score: 6  Pain Location: L quad Pain Descriptors / Indicators: Tightness;Sore;Heaviness Pain Intervention(s): Monitored during session;Repositioned    Home Living                      Prior Function            PT Goals (current goals can now be found in the care plan section) Acute Rehab PT Goals Patient Stated Goal: have less pain Potential to Achieve Goals: Good Progress towards PT goals: Progressing toward goals    Frequency    7X/week      PT Plan Current plan remains appropriate    Co-evaluation              AM-PAC PT "6 Clicks" Daily Activity  Outcome Measure  Difficulty turning over in bed (including adjusting bedclothes, sheets and blankets)?: Total Difficulty moving from lying on back to sitting on the side of the bed? : Total Difficulty sitting down on and standing up from a chair with arms (e.g., wheelchair, bedside commode, etc,.)?: A Little Help needed moving to and from a bed to chair (including a wheelchair)?: A Little Help needed walking in hospital room?: A Little Help needed climbing 3-5 steps with a railing? : A Little 6 Click Score: 14    End of Session Equipment Utilized During Treatment: Gait belt Activity Tolerance: Patient limited by pain Patient left: in chair;with call bell/phone within reach;with family/visitor present Nurse Communication: Mobility status;Weight bearing status PT Visit Diagnosis: Unsteadiness on feet (R26.81);Other abnormalities of gait and mobility (R26.89);Muscle weakness (generalized) (M62.81);Difficulty in walking, not elsewhere classified (R26.2);Pain Pain - Right/Left: Left Pain - part of body: Hip     Time: 5277-8242 PT Time Calculation (min) (ACUTE ONLY):  33 min  Charges:  $Gait Training: 8-22 mins $Therapeutic Exercise: 8-22 mins                     G Codes:       Governor Rooks, PTA pager [phone]    Cristela Blue 04/08/2017, 2:36 PM

## 2017-04-08 NOTE — Discharge Summary (Signed)
Patient ID: [name] MRN: [id number] DOB/AGE: 1933-08-10 83 y.o.  Admit date: 04/05/2017 Discharge date: 04/08/2017  Admission Diagnoses:  Active Problems:   Arthritis of left hip   Discharge Diagnoses:  Active Problems:   Arthritis of left hip  status post Procedure(s): LEFT TOTAL HIP ARTHROPLASTY ANTERIOR APPROACH  Past Medical History:  Diagnosis Date  . Arthritis    left hip and back  . Bronchiectasis    isolated to RML; status post right middle lobe partial resection.  . Complication of anesthesia    hard to wake up  . Dyspnea   . Gall stones   . GERD (gastroesophageal reflux disease)    history   . H/O osteoporosis   . Hypertension   . Hypothyroidism   . Yeast infection     Surgeries: Procedure(s): LEFT TOTAL HIP ARTHROPLASTY ANTERIOR APPROACH on 04/05/2017   Consultants:   Discharged Condition: Improved  Hospital Course: [name] is an 83 y.o. female who was admitted 04/05/2017 for operative treatment of left hip arthritis. Patient failed conservative treatments (please see the history and physical for the specifics) and had severe unremitting pain that affects sleep, daily activities and work/hobbies. After pre-op clearance, the patient was taken to the operating room on 04/05/2017 and underwent  Procedure(s): LEFT TOTAL HIP ARTHROPLASTY ANTERIOR APPROACH.    Patient was given perioperative antibiotics: Anti-infectives    Start     Dose/Rate Route Frequency Ordered Stop   04/05/17 1530  ceFAZolin (ANCEF) IVPB 1 g/50 mL premix     1 g 100 mL/hr over 30 Minutes Intravenous Every 6 hours 04/05/17 1450 04/05/17 2205   04/05/17 1200  ceFAZolin (ANCEF) IVPB 2g/100 mL premix     2 g 200 mL/hr over 30 Minutes Intravenous To [facility]Stay [facility] 04/04/17 1201 04/05/17 0757       Patient was given sequential compression devices and early ambulation to prevent DVT.   Patient benefited maximally from hospital stay and there were no complications. At the  time of discharge, the patient was urinating/moving their bowels without difficulty, tolerating a regular diet, pain is controlled with oral pain medications and they have been cleared by PT/OT.   Recent vital signs: Patient Vitals for the past 24 hrs:  BP Temp Temp src Pulse Resp SpO2  04/08/17 0542 127/69 98.9 F (37.2 C) Oral 85 18 97 %  04/07/17 2017 (!) 92/53 99.7 F (37.6 C) Oral 70 18 94 %  04/07/17 1629 129/72 98.8 F (37.1 C) Oral 69 18 98 %  04/07/17 1445 (!) 108/58 98.2 F (36.8 C) Oral 67 18 98 %  04/07/17 1252 90/61 98.6 F (37 C) Oral 63 19 96 %  04/07/17 1229 (!) 103/5 98.2 F (36.8 C) Oral 77 20 96 %     Recent laboratory studies:  Recent Labs  04/06/17 0407 04/07/17 0350 04/07/17 1847 04/08/17 0447  WBC 9.0 7.6  --  8.5  HGB 8.6* 7.8* 9.4* 8.3*  HCT 25.0* 22.4* 27.0* 23.8*  PLT 248 227  --  231  NA 134*  --   --   --   K 4.5  --   --   --   CL 102  --   --   --   CO2 27  --   --   --   BUN 11  --   --   --   CREATININE 0.71  --   --   --   GLUCOSE 134*  --   --   --  CALCIUM 8.5*  --   --   --      Discharge Medications:   Allergies as of 04/08/2017      Reactions   Ibuprofen Other (See Comments)   hallucinations   Naproxen Sodium Other (See Comments)   hallucinations      Medication List    STOP taking these medications   acetaminophen 650 MG CR tablet Commonly known as:  TYLENOL   HYDROcodone-acetaminophen 5-325 MG tablet Commonly known as:  NORCO/VICODIN Replaced by:  HYDROcodone-acetaminophen 7.5-325 MG tablet   lidocaine 5 % Commonly known as:  LIDODERM     TAKE these medications   aspirin 325 MG EC tablet Take 1 tablet (325 mg total) by mouth daily with breakfast. Start taking on:  04/09/2017 What changed:  medication strength  how much to take  when to take this   CALCIUM-MAGNESUIUM-ZINC 333-133-8.3 MG Tabs Take 1 tablet by mouth daily.   cholecalciferol 1000 units tablet Commonly known as:  VITAMIN D Take 1,000  Units by mouth daily.   fenofibrate 145 MG tablet Commonly known as:  TRICOR Take 1 tablet (145 mg total) by mouth daily.   ferrous sulfate 325 (65 FE) MG tablet Take 1 tablet (325 mg total) by mouth 2 (two) times daily with a meal.   FLAX SEED OIL PO Take 1 capsule by mouth daily.   furosemide 20 MG tablet Commonly known as:  LASIX Take 20 mg by mouth daily.   HYDROcodone-acetaminophen 7.5-325 MG tablet Commonly known as:  NORCO Take 1 tablet by mouth every 6 (six) hours as needed (breakthrough pain). Replaces:  HYDROcodone-acetaminophen 5-325 MG tablet   methimazole 5 MG tablet Commonly known as:  TAPAZOLE Take 5 mg by mouth daily.   metoprolol succinate 25 MG 24 hr tablet Commonly known as:  TOPROL-XL Take 25 mg by mouth daily.   nystatin cream Commonly known as:  MYCOSTATIN Apply 1 application topically daily as needed for dry skin. Apply under breasts and on stomach folds   potassium chloride SA 20 MEQ tablet Commonly known as:  K-DUR,KLOR-CON Take 20 mEq by mouth daily.   vitamin C 500 MG tablet Commonly known as:  ASCORBIC ACID Take 500 mg by mouth daily.       Diagnostic Studies: Dg Chest 2 View  Result Date: 04/05/2017 CLINICAL DATA:  Preop left hip surgery. EXAM: CHEST  2 VIEW COMPARISON:  07/03/2016 FINDINGS: Stable right hemidiaphragm elevation. Parenchymal lung suture in the right base. There is unchanged moderate cardiomegaly. Pulmonary vasculature is normal. No pleural effusions. Hilar and mediastinal contours are unremarkable and unchanged. IMPRESSION: Stable cardiomegaly. No consolidation or effusion. Normal vasculature. Electronically Signed   By: Andreas Newport M.D.   On: 04/05/2017 06:48   Dg C-arm 1-60 Min  Result Date: 04/05/2017 CLINICAL DATA:  Left hip replacement EXAM: OPERATIVE LEFT HIP WITH PELVIS; DG C-ARM 61-120 MIN COMPARISON:  None. FLUOROSCOPY TIME:  Radiation Exposure Index (as provided by the fluoroscopic device): Not available If  the device does not provide the exposure index: Fluoroscopy Time:  31 seconds Number of Acquired Images:  2 FINDINGS: Left hip prosthesis is noted in satisfactory position. No acute abnormality is noted. IMPRESSION: Status post left hip replacement Electronically Signed   By: Inez Catalina M.D.   On: 04/05/2017 09:53   Dg Hip Port Unilat With Pelvis 1v Left  Result Date: 04/05/2017 CLINICAL DATA:  Left hip replacement. EXAM: DG HIP (WITH OR WITHOUT PELVIS) 1V PORT LEFT COMPARISON:  04/05/2017 .  FINDINGS: Total left hip replacement. Hardware intact. Anatomic alignment. No acute bony abnormality identified. Degenerative changes lumbar spine and right hip. IMPRESSION: Total left hip replacement with anatomic alignment. Electronically Signed   By: Marcello Moores  Register   On: 04/05/2017 11:37   Dg Hip Operative Unilat W Or W/o Pelvis Left  Result Date: 04/05/2017 CLINICAL DATA:  Left hip replacement EXAM: OPERATIVE LEFT HIP WITH PELVIS; DG C-ARM 61-120 MIN COMPARISON:  None. FLUOROSCOPY TIME:  Radiation Exposure Index (as provided by the fluoroscopic device): Not available If the device does not provide the exposure index: Fluoroscopy Time:  31 seconds Number of Acquired Images:  2 FINDINGS: Left hip prosthesis is noted in satisfactory position. No acute abnormality is noted. IMPRESSION: Status post left hip replacement Electronically Signed   By: Inez Catalina M.D.   On: 04/05/2017 09:53      Follow-up Information    [name], MD. Schedule an appointment as soon as possible for a visit today.   Specialty:  Orthopedic Surgery Why:  call to schedule return office visit for 2 weeks postop. return sooner if needed.  Contact information: [place] [address] [phone]           Discharge Plan:  discharge to home  Disposition:     Signed: Benjiman Core for Rodell Perna MD 04/08/2017, 12:09 PM

## 2017-04-08 NOTE — Progress Notes (Addendum)
Subjective: Doing well.  C/o left hip/thigh pain.  Feels better after transfusion PRBC's.  Denies cp, sob, lightheaded/dizziness.  Making good progress with PT.    Objective: Vital signs in last 24 hours: Temp:  [98.2 F (36.8 C)-99.7 F (37.6 C)] 98.9 F (37.2 C) (07/02 0542) Pulse Rate:  [63-85] 85 (07/02 0542) Resp:  [18-20] 18 (07/02 0542) BP: (90-129)/(5-72) 127/69 (07/02 0542) SpO2:  [94 %-98 %] 97 % (07/02 0542)  Intake/Output from previous day: 07/01 0701 - 07/02 0700 In: 818.3 [P.O.:482; Blood:336.3] Out: 200 [Urine:200] Intake/Output this shift: No intake/output data recorded.   Recent Labs  04/06/17 0407 04/07/17 0350 04/07/17 1847 04/08/17 0447  HGB 8.6* 7.8* 9.4* 8.3*    Recent Labs  04/07/17 0350 04/07/17 1847 04/08/17 0447  WBC 7.6  --  8.5  RBC 2.82*  --  2.96*  HCT 22.4* 27.0* 23.8*  PLT 227  --  231    Recent Labs  04/06/17 0407  NA 134*  K 4.5  CL 102  CO2 27  BUN 11  CREATININE 0.71  GLUCOSE 134*  CALCIUM 8.5*   No results for input(s): LABPT, INR in the last 72 hours.  Exam:   Pleasant female, alert and oriented, NAD.  Ambulated well with PT in hall.  Wound looks good.  Staples intact.  No drainage or signs of infection.  Left thigh swelling but soft.  bilat calves nontender, NVI.  No focal motor deficits.   Assessment/Plan: Postop ABLA.  Currently asymptomatic after transfusion one unit PRBC's.  D/c home today if does well with PT.  Scripts on chart for norco (pain) and aspirin 325mg  (DVT prophylaxis) and FeSo4 (anemia).  Will f/u 2 weeks postop for recheck.    Benjiman Core 04/08/2017, 11:54 AM

## 2017-04-09 ENCOUNTER — Telehealth (INDEPENDENT_AMBULATORY_CARE_PROVIDER_SITE_OTHER): Payer: Self-pay | Admitting: Radiology

## 2017-04-09 NOTE — Telephone Encounter (Signed)
I called discussed. Can use pain med q 4-6 hrs . Can do iron one every other day.

## 2017-04-09 NOTE — Telephone Encounter (Signed)
Patient is calling triage this morning. She had THA 04/05/17 and was discharged from the hospital yesterday. She has two questions. One regarding her pain medication. She is taking hydrocodone 7.5/325 1 po q 6 hours. She feels this is not managing her pain and is wanting to know if she can take tylenol in between, she questions if that would harm her kidneys.  Second question is in regards to the iron she was advised to take. She feels like the ferrous sulfate 325 1 po BID is too much. She wants to know if she can just eat spinach or liver pudding something of this nature instead. Please advise.

## 2017-04-10 DIAGNOSIS — M810 Age-related osteoporosis without current pathological fracture: Secondary | ICD-10-CM | POA: Diagnosis not present

## 2017-04-10 DIAGNOSIS — Z471 Aftercare following joint replacement surgery: Secondary | ICD-10-CM | POA: Diagnosis not present

## 2017-04-10 DIAGNOSIS — M479 Spondylosis, unspecified: Secondary | ICD-10-CM | POA: Diagnosis not present

## 2017-04-10 DIAGNOSIS — J479 Bronchiectasis, uncomplicated: Secondary | ICD-10-CM | POA: Diagnosis not present

## 2017-04-10 DIAGNOSIS — Z7982 Long term (current) use of aspirin: Secondary | ICD-10-CM | POA: Diagnosis not present

## 2017-04-10 DIAGNOSIS — I10 Essential (primary) hypertension: Secondary | ICD-10-CM | POA: Diagnosis not present

## 2017-04-10 DIAGNOSIS — Z96642 Presence of left artificial hip joint: Secondary | ICD-10-CM | POA: Diagnosis not present

## 2017-04-11 ENCOUNTER — Telehealth (INDEPENDENT_AMBULATORY_CARE_PROVIDER_SITE_OTHER): Payer: Self-pay | Admitting: Orthopaedic Surgery

## 2017-04-11 NOTE — Telephone Encounter (Signed)
NO PRECAUTIONS HAD ANTERIOR HIP

## 2017-04-11 NOTE — Telephone Encounter (Signed)
IC, no answer. LMVM with all details

## 2017-04-11 NOTE — Telephone Encounter (Signed)
Please advise. Any hip precautions?

## 2017-04-11 NOTE — Telephone Encounter (Signed)
PHYS THERAPIST FROM [facility] REQUESTING VERBAL FOR 2 WK 1, 3 WK 2, 2 WK 1  PLAN IS L LOWER EXTREMITY, TRANSFER TRAINING, GAIT TRAINING, AND BED MOBILITY   ALSO REQUESTING OC EVAL FOR NEXT WK   WANTS TO KNOW IF THERE ARE ANY HIP PRECAUTIONS PLEASE.   [phone] BRAD

## 2017-04-12 DIAGNOSIS — M810 Age-related osteoporosis without current pathological fracture: Secondary | ICD-10-CM | POA: Diagnosis not present

## 2017-04-12 DIAGNOSIS — I10 Essential (primary) hypertension: Secondary | ICD-10-CM | POA: Diagnosis not present

## 2017-04-12 DIAGNOSIS — M479 Spondylosis, unspecified: Secondary | ICD-10-CM | POA: Diagnosis not present

## 2017-04-12 DIAGNOSIS — Z471 Aftercare following joint replacement surgery: Secondary | ICD-10-CM | POA: Diagnosis not present

## 2017-04-12 DIAGNOSIS — J479 Bronchiectasis, uncomplicated: Secondary | ICD-10-CM | POA: Diagnosis not present

## 2017-04-12 DIAGNOSIS — Z96642 Presence of left artificial hip joint: Secondary | ICD-10-CM | POA: Diagnosis not present

## 2017-04-15 DIAGNOSIS — Z96642 Presence of left artificial hip joint: Secondary | ICD-10-CM | POA: Diagnosis not present

## 2017-04-15 DIAGNOSIS — M810 Age-related osteoporosis without current pathological fracture: Secondary | ICD-10-CM | POA: Diagnosis not present

## 2017-04-15 DIAGNOSIS — M479 Spondylosis, unspecified: Secondary | ICD-10-CM | POA: Diagnosis not present

## 2017-04-15 DIAGNOSIS — Z471 Aftercare following joint replacement surgery: Secondary | ICD-10-CM | POA: Diagnosis not present

## 2017-04-15 DIAGNOSIS — J479 Bronchiectasis, uncomplicated: Secondary | ICD-10-CM | POA: Diagnosis not present

## 2017-04-15 DIAGNOSIS — I10 Essential (primary) hypertension: Secondary | ICD-10-CM | POA: Diagnosis not present

## 2017-04-16 ENCOUNTER — Telehealth (INDEPENDENT_AMBULATORY_CARE_PROVIDER_SITE_OTHER): Payer: Self-pay | Admitting: Orthopaedic Surgery

## 2017-04-16 ENCOUNTER — Encounter (INDEPENDENT_AMBULATORY_CARE_PROVIDER_SITE_OTHER): Payer: Self-pay | Admitting: Orthopaedic Surgery

## 2017-04-16 ENCOUNTER — Other Ambulatory Visit (INDEPENDENT_AMBULATORY_CARE_PROVIDER_SITE_OTHER): Payer: Self-pay | Admitting: Radiology

## 2017-04-16 ENCOUNTER — Ambulatory Visit (INDEPENDENT_AMBULATORY_CARE_PROVIDER_SITE_OTHER): Payer: Medicare Other | Admitting: Orthopaedic Surgery

## 2017-04-16 ENCOUNTER — Ambulatory Visit (INDEPENDENT_AMBULATORY_CARE_PROVIDER_SITE_OTHER): Payer: Medicare Other

## 2017-04-16 VITALS — BP 122/66 | HR 73

## 2017-04-16 DIAGNOSIS — Z96642 Presence of left artificial hip joint: Secondary | ICD-10-CM

## 2017-04-16 DIAGNOSIS — M479 Spondylosis, unspecified: Secondary | ICD-10-CM | POA: Diagnosis not present

## 2017-04-16 DIAGNOSIS — J479 Bronchiectasis, uncomplicated: Secondary | ICD-10-CM | POA: Diagnosis not present

## 2017-04-16 DIAGNOSIS — M810 Age-related osteoporosis without current pathological fracture: Secondary | ICD-10-CM | POA: Diagnosis not present

## 2017-04-16 DIAGNOSIS — I10 Essential (primary) hypertension: Secondary | ICD-10-CM | POA: Diagnosis not present

## 2017-04-16 DIAGNOSIS — Z471 Aftercare following joint replacement surgery: Secondary | ICD-10-CM | POA: Diagnosis not present

## 2017-04-16 MED ORDER — HYDROCODONE-ACETAMINOPHEN 7.5-325 MG PO TABS: ORAL_TABLET | ORAL | 0 refills | Status: DC

## 2017-04-16 NOTE — Telephone Encounter (Signed)
[facility] CALLING FOR VERBAL FOR 2X 3 AND 1WK 1  [phone]

## 2017-04-16 NOTE — Progress Notes (Signed)
Post-Op Visit Note   Patient: [name]           Date of Birth: 09/22/1933           MRN: [id number] Visit Date: 04/16/2017 PCP: Glendale Chard, MD   Assessment & Plan:s/p left direct anterior total hip arthroplasty. She measures by blocks 1/2 inch long on the left hip. She has hip osteoarthritis in the right hip and has some shortening on the right side. She can use the ninth 16th heel lift which is made of wool  and will compress. Pain medication renewed I'll recheck her in 4 weeks.  Chief Complaint:  Chief Complaint  Patient presents with  . Left Hip - Routine Post Op   Visit Diagnoses:  1. H/O total hip arthroplasty, left      Left total hip arthroplasty 04/05/2017  Plan: Return 4 weeks. Follow-Up Instructions: Return in about 4 weeks (around 05/14/2017).   Orders:  Orders Placed This Encounter  Procedures  . XR HIP UNILAT W OR W/O PELVIS 2-3 VIEWS LEFT   No orders of the defined types were placed in this encounter.   Imaging: No results found.  PMFS History: Patient Active Problem List   Diagnosis Date Noted  . Arthritis of left hip 04/05/2017  . Preop cardiovascular exam 03/12/2017  . Cough   . Abdominal pain   . Symptomatic cholelithiasis 07/01/2016  . Nonspecific ST-T wave electrocardiographic changes   . Lower leg edema 02/16/2015  . Obesity (BMI 30.0-34.9) 02/16/2015  . Osteoporosis 01/11/2012  . Essential hypertension 01/11/2012  . Hyperthyroidism 01/11/2012  . Hypercholesteremia 01/11/2012  . H/O vaginal hysterectomy 1974 01/11/2012    Class: History of  . S/P removal of lung  partial R lobe  2010 01/11/2012  . History of hernia repair  R ing. 1975 01/11/2012  . BACK PAIN 11/12/2007  . DYSPNEA 11/11/2007   Past Medical History:  Diagnosis Date  . Arthritis    left hip and back  . Bronchiectasis    isolated to RML; status post right middle lobe partial resection.  . Complication of anesthesia    hard to wake up  . Dyspnea   . Gall  stones   . GERD (gastroesophageal reflux disease)    history   . H/O osteoporosis   . Hypertension   . Hypothyroidism   . Yeast infection     Family History  Problem Relation Age of Onset  . Hypertension Mother   . CVA Mother 53  . Heart attack Sister   . Diabetes Sister   . Heart attack Brother   . Heart attack Brother     Past Surgical History:  Procedure Laterality Date  . ABDOMINAL HYSTERECTOMY  1974  . CHOLECYSTECTOMY N/A 07/02/2016   Procedure: LAPAROSCOPIC CHOLECYSTECTOMY;  Surgeon: Stark Klein, MD;  Location: [place];  Service: General;  Laterality: N/A;  . COLONOSCOPY    . EYE SURGERY     stye removed  . HERNIA REPAIR  1975  . KNEE SURGERY Left   . LUNG REMOVAL, PARTIAL  208   RML  . NM MYOVIEW LTD  06/2011   Normal EF. No ischemia or infarction.  Marland Kitchen TOTAL HIP ARTHROPLASTY Left 04/05/2017   Procedure: LEFT TOTAL HIP ARTHROPLASTY ANTERIOR APPROACH;  Surgeon: [name], MD;  Location: [place];  Service: Orthopedics;  Laterality: Left;  . TRANSTHORACIC ECHOCARDIOGRAM  06/2011   Mild concentric LVH. Normal EF with impaired relaxation. Mildly elevated RV pressures of 30 and  40 mmHg.  Marland Kitchen TRANSTHORACIC ECHOCARDIOGRAM  06/2016   normal LV size and function. EF 60-65% with GRD 2 DD. Mild aortic stenosis. Mild LA dilation. Mild to moderately increased PA pressures (46 mmHg).   Social History   Occupational History  . Not on file.   Social History Main Topics  . Smoking status: Never Smoker  . Smokeless tobacco: Never Used  . Alcohol use No  . Drug use: No  . Sexual activity: No

## 2017-04-17 DIAGNOSIS — M479 Spondylosis, unspecified: Secondary | ICD-10-CM | POA: Diagnosis not present

## 2017-04-17 DIAGNOSIS — Z471 Aftercare following joint replacement surgery: Secondary | ICD-10-CM | POA: Diagnosis not present

## 2017-04-17 DIAGNOSIS — J479 Bronchiectasis, uncomplicated: Secondary | ICD-10-CM | POA: Diagnosis not present

## 2017-04-17 DIAGNOSIS — I10 Essential (primary) hypertension: Secondary | ICD-10-CM | POA: Diagnosis not present

## 2017-04-17 DIAGNOSIS — M810 Age-related osteoporosis without current pathological fracture: Secondary | ICD-10-CM | POA: Diagnosis not present

## 2017-04-17 DIAGNOSIS — Z96642 Presence of left artificial hip joint: Secondary | ICD-10-CM | POA: Diagnosis not present

## 2017-04-17 NOTE — Telephone Encounter (Signed)
I called to give verbal for home health services for this patient status post total hip.

## 2017-04-18 DIAGNOSIS — I10 Essential (primary) hypertension: Secondary | ICD-10-CM | POA: Diagnosis not present

## 2017-04-18 DIAGNOSIS — M479 Spondylosis, unspecified: Secondary | ICD-10-CM | POA: Diagnosis not present

## 2017-04-18 DIAGNOSIS — M810 Age-related osteoporosis without current pathological fracture: Secondary | ICD-10-CM | POA: Diagnosis not present

## 2017-04-18 DIAGNOSIS — Z471 Aftercare following joint replacement surgery: Secondary | ICD-10-CM | POA: Diagnosis not present

## 2017-04-18 DIAGNOSIS — Z96642 Presence of left artificial hip joint: Secondary | ICD-10-CM | POA: Diagnosis not present

## 2017-04-18 DIAGNOSIS — J479 Bronchiectasis, uncomplicated: Secondary | ICD-10-CM | POA: Diagnosis not present

## 2017-04-19 ENCOUNTER — Telehealth (INDEPENDENT_AMBULATORY_CARE_PROVIDER_SITE_OTHER): Payer: Self-pay | Admitting: Orthopaedic Surgery

## 2017-04-19 DIAGNOSIS — J479 Bronchiectasis, uncomplicated: Secondary | ICD-10-CM | POA: Diagnosis not present

## 2017-04-19 DIAGNOSIS — Z471 Aftercare following joint replacement surgery: Secondary | ICD-10-CM | POA: Diagnosis not present

## 2017-04-19 DIAGNOSIS — Z96642 Presence of left artificial hip joint: Secondary | ICD-10-CM | POA: Diagnosis not present

## 2017-04-19 DIAGNOSIS — I10 Essential (primary) hypertension: Secondary | ICD-10-CM | POA: Diagnosis not present

## 2017-04-19 DIAGNOSIS — M810 Age-related osteoporosis without current pathological fracture: Secondary | ICD-10-CM | POA: Diagnosis not present

## 2017-04-19 DIAGNOSIS — M479 Spondylosis, unspecified: Secondary | ICD-10-CM | POA: Diagnosis not present

## 2017-04-19 NOTE — Telephone Encounter (Signed)
Please advise 

## 2017-04-19 NOTE — Telephone Encounter (Signed)
Patient called to check on the status of her FMLA forms. Please advise.

## 2017-04-22 NOTE — Telephone Encounter (Signed)
Stamped and placed on your desk.

## 2017-04-22 NOTE — Telephone Encounter (Signed)
Turned in for the doctor's signature today.

## 2017-04-23 DIAGNOSIS — Z96642 Presence of left artificial hip joint: Secondary | ICD-10-CM | POA: Diagnosis not present

## 2017-04-23 DIAGNOSIS — I10 Essential (primary) hypertension: Secondary | ICD-10-CM | POA: Diagnosis not present

## 2017-04-23 DIAGNOSIS — M810 Age-related osteoporosis without current pathological fracture: Secondary | ICD-10-CM | POA: Diagnosis not present

## 2017-04-23 DIAGNOSIS — M479 Spondylosis, unspecified: Secondary | ICD-10-CM | POA: Diagnosis not present

## 2017-04-23 DIAGNOSIS — Z471 Aftercare following joint replacement surgery: Secondary | ICD-10-CM | POA: Diagnosis not present

## 2017-04-23 DIAGNOSIS — J479 Bronchiectasis, uncomplicated: Secondary | ICD-10-CM | POA: Diagnosis not present

## 2017-04-23 NOTE — Telephone Encounter (Signed)
Thank you :)

## 2017-04-24 DIAGNOSIS — M479 Spondylosis, unspecified: Secondary | ICD-10-CM | POA: Diagnosis not present

## 2017-04-24 DIAGNOSIS — Z471 Aftercare following joint replacement surgery: Secondary | ICD-10-CM | POA: Diagnosis not present

## 2017-04-24 DIAGNOSIS — Z96642 Presence of left artificial hip joint: Secondary | ICD-10-CM | POA: Diagnosis not present

## 2017-04-24 DIAGNOSIS — I10 Essential (primary) hypertension: Secondary | ICD-10-CM | POA: Diagnosis not present

## 2017-04-24 DIAGNOSIS — M810 Age-related osteoporosis without current pathological fracture: Secondary | ICD-10-CM | POA: Diagnosis not present

## 2017-04-24 DIAGNOSIS — J479 Bronchiectasis, uncomplicated: Secondary | ICD-10-CM | POA: Diagnosis not present

## 2017-04-26 ENCOUNTER — Telehealth (INDEPENDENT_AMBULATORY_CARE_PROVIDER_SITE_OTHER): Payer: Self-pay | Admitting: Orthopaedic Surgery

## 2017-04-26 ENCOUNTER — Other Ambulatory Visit: Payer: Self-pay

## 2017-04-26 DIAGNOSIS — I10 Essential (primary) hypertension: Secondary | ICD-10-CM | POA: Diagnosis not present

## 2017-04-26 DIAGNOSIS — Z96642 Presence of left artificial hip joint: Secondary | ICD-10-CM | POA: Diagnosis not present

## 2017-04-26 DIAGNOSIS — M810 Age-related osteoporosis without current pathological fracture: Secondary | ICD-10-CM | POA: Diagnosis not present

## 2017-04-26 DIAGNOSIS — Z471 Aftercare following joint replacement surgery: Secondary | ICD-10-CM | POA: Diagnosis not present

## 2017-04-26 DIAGNOSIS — J479 Bronchiectasis, uncomplicated: Secondary | ICD-10-CM | POA: Diagnosis not present

## 2017-04-26 DIAGNOSIS — M479 Spondylosis, unspecified: Secondary | ICD-10-CM | POA: Diagnosis not present

## 2017-04-26 MED ORDER — HYDROCODONE-ACETAMINOPHEN 7.5-325 MG PO TABS: ORAL_TABLET | ORAL | 0 refills | Status: DC

## 2017-04-26 NOTE — Telephone Encounter (Signed)
20

## 2017-04-26 NOTE — Telephone Encounter (Signed)
I called patient and advised script is ready at front for pick up. Patient states that she needs for her sister to come and pick up the RX for her as her daughter who is listed cannot come and get it.  Patient gave one time authorization for sister, Casimer Bilis, to pick up Rx today.  Patient will need to update her authorization form to include her sister.

## 2017-04-26 NOTE — Telephone Encounter (Signed)
Could you please advise since Dr. Lorin Mercy is out of the office? Patient had left total hip on 04/05/2017. She was given Hydrocodone 7.5/325 1 po  q 4-6 hrs prn pain #40 with no refills on 04/16/2017.

## 2017-04-26 NOTE — Telephone Encounter (Signed)
PT REQUESTS REFILL OF PAIN MEDS PLEASE.  [phone]

## 2017-04-29 DIAGNOSIS — I10 Essential (primary) hypertension: Secondary | ICD-10-CM | POA: Diagnosis not present

## 2017-04-29 DIAGNOSIS — M479 Spondylosis, unspecified: Secondary | ICD-10-CM | POA: Diagnosis not present

## 2017-04-29 DIAGNOSIS — J479 Bronchiectasis, uncomplicated: Secondary | ICD-10-CM | POA: Diagnosis not present

## 2017-04-29 DIAGNOSIS — Z96642 Presence of left artificial hip joint: Secondary | ICD-10-CM | POA: Diagnosis not present

## 2017-04-29 DIAGNOSIS — M810 Age-related osteoporosis without current pathological fracture: Secondary | ICD-10-CM | POA: Diagnosis not present

## 2017-04-29 DIAGNOSIS — Z471 Aftercare following joint replacement surgery: Secondary | ICD-10-CM | POA: Diagnosis not present

## 2017-04-30 DIAGNOSIS — M810 Age-related osteoporosis without current pathological fracture: Secondary | ICD-10-CM | POA: Diagnosis not present

## 2017-04-30 DIAGNOSIS — Z96642 Presence of left artificial hip joint: Secondary | ICD-10-CM | POA: Diagnosis not present

## 2017-04-30 DIAGNOSIS — Z471 Aftercare following joint replacement surgery: Secondary | ICD-10-CM | POA: Diagnosis not present

## 2017-04-30 DIAGNOSIS — J479 Bronchiectasis, uncomplicated: Secondary | ICD-10-CM | POA: Diagnosis not present

## 2017-04-30 DIAGNOSIS — I10 Essential (primary) hypertension: Secondary | ICD-10-CM | POA: Diagnosis not present

## 2017-04-30 DIAGNOSIS — M479 Spondylosis, unspecified: Secondary | ICD-10-CM | POA: Diagnosis not present

## 2017-05-02 ENCOUNTER — Telehealth (INDEPENDENT_AMBULATORY_CARE_PROVIDER_SITE_OTHER): Payer: Self-pay | Admitting: Orthopaedic Surgery

## 2017-05-02 DIAGNOSIS — Z471 Aftercare following joint replacement surgery: Secondary | ICD-10-CM | POA: Diagnosis not present

## 2017-05-02 DIAGNOSIS — J479 Bronchiectasis, uncomplicated: Secondary | ICD-10-CM | POA: Diagnosis not present

## 2017-05-02 DIAGNOSIS — Z96642 Presence of left artificial hip joint: Secondary | ICD-10-CM | POA: Diagnosis not present

## 2017-05-02 DIAGNOSIS — I10 Essential (primary) hypertension: Secondary | ICD-10-CM | POA: Diagnosis not present

## 2017-05-02 DIAGNOSIS — M810 Age-related osteoporosis without current pathological fracture: Secondary | ICD-10-CM | POA: Diagnosis not present

## 2017-05-02 DIAGNOSIS — M479 Spondylosis, unspecified: Secondary | ICD-10-CM | POA: Diagnosis not present

## 2017-05-02 NOTE — Telephone Encounter (Signed)
OK for # 20 of norco 5/325 thanks

## 2017-05-02 NOTE — Telephone Encounter (Signed)
Please advise. Patient is status post left total hip arthroplasty on 04/05/2017. She had called on 7/20 for refill when you were out of office and Dr. Erlinda Hong wrote for Hydrocodone 7.5/325 1 po q 4-6 hrs prn #20.

## 2017-05-02 NOTE — Telephone Encounter (Signed)
Patient called needing Rx refilled (Hydrocodone) Patient said she has 6 tabs left. Patient said she will see Dr Lorin Mercy 05/14/17. The number to contact patient is [phone]

## 2017-05-03 DIAGNOSIS — J479 Bronchiectasis, uncomplicated: Secondary | ICD-10-CM | POA: Diagnosis not present

## 2017-05-03 DIAGNOSIS — M479 Spondylosis, unspecified: Secondary | ICD-10-CM | POA: Diagnosis not present

## 2017-05-03 DIAGNOSIS — Z96642 Presence of left artificial hip joint: Secondary | ICD-10-CM | POA: Diagnosis not present

## 2017-05-03 DIAGNOSIS — Z471 Aftercare following joint replacement surgery: Secondary | ICD-10-CM | POA: Diagnosis not present

## 2017-05-03 DIAGNOSIS — I10 Essential (primary) hypertension: Secondary | ICD-10-CM | POA: Diagnosis not present

## 2017-05-03 DIAGNOSIS — M810 Age-related osteoporosis without current pathological fracture: Secondary | ICD-10-CM | POA: Diagnosis not present

## 2017-05-03 NOTE — Telephone Encounter (Signed)
I called patient and advised script at front for pick up. Patient requests Adriana Reams (sister) to pick up again. I did advise she would need to add her to her paperwork at next appt.

## 2017-05-07 DIAGNOSIS — M479 Spondylosis, unspecified: Secondary | ICD-10-CM | POA: Diagnosis not present

## 2017-05-07 DIAGNOSIS — Z471 Aftercare following joint replacement surgery: Secondary | ICD-10-CM | POA: Diagnosis not present

## 2017-05-07 DIAGNOSIS — J479 Bronchiectasis, uncomplicated: Secondary | ICD-10-CM | POA: Diagnosis not present

## 2017-05-07 DIAGNOSIS — I10 Essential (primary) hypertension: Secondary | ICD-10-CM | POA: Diagnosis not present

## 2017-05-07 DIAGNOSIS — Z96642 Presence of left artificial hip joint: Secondary | ICD-10-CM | POA: Diagnosis not present

## 2017-05-07 DIAGNOSIS — M810 Age-related osteoporosis without current pathological fracture: Secondary | ICD-10-CM | POA: Diagnosis not present

## 2017-05-08 DIAGNOSIS — E0590 Thyrotoxicosis, unspecified without thyrotoxic crisis or storm: Secondary | ICD-10-CM | POA: Diagnosis not present

## 2017-05-09 ENCOUNTER — Telehealth (INDEPENDENT_AMBULATORY_CARE_PROVIDER_SITE_OTHER): Payer: Self-pay

## 2017-05-09 NOTE — Telephone Encounter (Signed)
Patient would like to know if she could use a topical cream on her hip, states that she is having some soreness.  Would like to know about outpatient therapy and what can she take for an headache?  CB#[phone].  Please advise. Thank You.

## 2017-05-14 ENCOUNTER — Encounter (INDEPENDENT_AMBULATORY_CARE_PROVIDER_SITE_OTHER): Payer: Self-pay | Admitting: Orthopaedic Surgery

## 2017-05-14 ENCOUNTER — Ambulatory Visit (INDEPENDENT_AMBULATORY_CARE_PROVIDER_SITE_OTHER): Payer: Medicare Other | Admitting: Orthopaedic Surgery

## 2017-05-14 VITALS — BP 140/76 | HR 66

## 2017-05-14 DIAGNOSIS — Z96642 Presence of left artificial hip joint: Secondary | ICD-10-CM | POA: Insufficient documentation

## 2017-05-14 MED ORDER — TRAMADOL HCL 50 MG PO TABS: 50.0000 mg | ORAL_TABLET | Freq: Two times a day (BID) | ORAL | 0 refills | Status: DC | PRN

## 2017-05-14 NOTE — Progress Notes (Signed)
Post-Op Visit Note   Patient: [name]           Date of Birth: [birth year]           MRN: [id number] Visit Date: 05/14/2017 PCP: Glendale Chard, MD   Assessment & Plan: Status post left total hip arthroplasty. Prescription for half-inch shoe lift for the right leg prescribed. If she ever decides of the right total hip replaced this can be corrected.  Chief Complaint:  Chief Complaint  Patient presents with  . Left Hip - Routine Post Op   Visit Diagnoses:  1. Presence of left artificial hip joint     Plan: Patient returns post left total hip arthroplasty she still has some soreness she is a mature with her walker and I think she should be up to soon progressed to a cane that she would use in her right hand. She has half-inch leg discrepancy with the operative left leg being longer. She had some significant hip osteoarthritis somewhere on the opposite right hip. She had considerable amount of pain after the surgery and has been slowly weaning down her pain medication.  Follow-Up Instructions: No Follow-up on file.   Orders:  No orders of the defined types were placed in this encounter.  No orders of the defined types were placed in this encounter.   Imaging: No results found.  PMFS History: Patient Active Problem List   Diagnosis Date Noted  . Presence of left artificial hip joint 05/14/2017  . Arthritis of left hip 04/05/2017  . Preop cardiovascular exam 03/12/2017  . Cough   . Abdominal pain   . Symptomatic cholelithiasis 07/01/2016  . Nonspecific ST-T wave electrocardiographic changes   . Lower leg edema 02/16/2015  . Obesity (BMI 30.0-34.9) 02/16/2015  . Osteoporosis 01/11/2012  . Essential hypertension 01/11/2012  . Hyperthyroidism 01/11/2012  . Hypercholesteremia 01/11/2012  . H/O vaginal hysterectomy 1974 01/11/2012    Class: History of  . S/P removal of lung  partial R lobe  2010 01/11/2012  . History of hernia repair  R ing. 1975 01/11/2012  . BACK  PAIN 11/12/2007  . DYSPNEA 11/11/2007   Past Medical History:  Diagnosis Date  . Arthritis    left hip and back  . Bronchiectasis    isolated to RML; status post right middle lobe partial resection.  . Complication of anesthesia    hard to wake up  . Dyspnea   . Gall stones   . GERD (gastroesophageal reflux disease)    history   . H/O osteoporosis   . Hypertension   . Hypothyroidism   . Yeast infection     Family History  Problem Relation Age of Onset  . Hypertension Mother   . CVA Mother 77  . Heart attack Sister   . Diabetes Sister   . Heart attack Brother   . Heart attack Brother     Past Surgical History:  Procedure Laterality Date  . ABDOMINAL HYSTERECTOMY  1974  . CHOLECYSTECTOMY N/A 07/02/2016   Procedure: LAPAROSCOPIC CHOLECYSTECTOMY;  Surgeon: Stark Klein, MD;  Location: [facility];  Service: General;  Laterality: N/A;  . COLONOSCOPY    . EYE SURGERY     stye removed  . HERNIA REPAIR  1975  . KNEE SURGERY Left   . LUNG REMOVAL, PARTIAL  208   RML  . NM MYOVIEW LTD  06/2011   Normal EF. No ischemia or infarction.  Marland Kitchen TOTAL HIP ARTHROPLASTY Left 04/05/2017   Procedure: LEFT TOTAL  HIP ARTHROPLASTY ANTERIOR APPROACH;  Surgeon: [name], MD;  Location: [facility];  Service: Orthopedics;  Laterality: Left;  . TRANSTHORACIC ECHOCARDIOGRAM  06/2011   Mild concentric LVH. Normal EF with impaired relaxation. Mildly elevated RV pressures of 30 and 40 mmHg.  Marland Kitchen TRANSTHORACIC ECHOCARDIOGRAM  06/2016   normal LV size and function. EF 60-65% with GRD 2 DD. Mild aortic stenosis. Mild LA dilation. Mild to moderately increased PA pressures (46 mmHg).   Social History   Occupational History  . Not on file.   Social History Main Topics  . Smoking status: Never Smoker  . Smokeless tobacco: Never Used  . Alcohol use No  . Drug use: No  . Sexual activity: No

## 2017-05-15 DIAGNOSIS — I10 Essential (primary) hypertension: Secondary | ICD-10-CM | POA: Diagnosis not present

## 2017-05-15 DIAGNOSIS — E049 Nontoxic goiter, unspecified: Secondary | ICD-10-CM | POA: Diagnosis not present

## 2017-05-15 DIAGNOSIS — E7800 Pure hypercholesterolemia, unspecified: Secondary | ICD-10-CM | POA: Diagnosis not present

## 2017-05-15 DIAGNOSIS — E0590 Thyrotoxicosis, unspecified without thyrotoxic crisis or storm: Secondary | ICD-10-CM | POA: Diagnosis not present

## 2017-06-11 ENCOUNTER — Telehealth (INDEPENDENT_AMBULATORY_CARE_PROVIDER_SITE_OTHER): Payer: Self-pay | Admitting: Orthopaedic Surgery

## 2017-06-11 NOTE — Telephone Encounter (Signed)
I was going to send in but I don't see where it has been sent in before

## 2017-06-11 NOTE — Telephone Encounter (Signed)
Ok for refill? 

## 2017-06-11 NOTE — Telephone Encounter (Signed)
Patient called needing Rx refilled (Diclofenac) Patient asked that the medication be sent to [facility].  ID # is [id number]. Reference # [id number].The number to contact patient is [phone]

## 2017-06-11 NOTE — Telephone Encounter (Signed)
Ok thanks 

## 2017-06-19 NOTE — Telephone Encounter (Signed)
I left voicemail for patient regarding which she needs refilled. Has been given Diclofenac Gel and Diclofenac 75mg  in the past.

## 2017-06-20 NOTE — Telephone Encounter (Signed)
Awaiting call from patient regarding which med she needs.

## 2017-06-24 ENCOUNTER — Telehealth (INDEPENDENT_AMBULATORY_CARE_PROVIDER_SITE_OTHER): Payer: Self-pay

## 2017-06-24 NOTE — Telephone Encounter (Signed)
[name] with [facility] would like for Plan of care to be refaxed to [phone].  Stated that the fax that was sent, was not able to see doctor's signature or date.  Cb# is [phone].  Please advise.  Thank you.

## 2017-06-26 DIAGNOSIS — E559 Vitamin D deficiency, unspecified: Secondary | ICD-10-CM | POA: Diagnosis not present

## 2017-06-26 DIAGNOSIS — Z6837 Body mass index (BMI) 37.0-37.9, adult: Secondary | ICD-10-CM | POA: Diagnosis not present

## 2017-06-26 DIAGNOSIS — R7309 Other abnormal glucose: Secondary | ICD-10-CM | POA: Diagnosis not present

## 2017-06-26 DIAGNOSIS — I129 Hypertensive chronic kidney disease with stage 1 through stage 4 chronic kidney disease, or unspecified chronic kidney disease: Secondary | ICD-10-CM | POA: Diagnosis not present

## 2017-06-26 DIAGNOSIS — Z0000 Encounter for general adult medical examination without abnormal findings: Secondary | ICD-10-CM | POA: Diagnosis not present

## 2017-06-26 DIAGNOSIS — N182 Chronic kidney disease, stage 2 (mild): Secondary | ICD-10-CM | POA: Diagnosis not present

## 2017-06-27 NOTE — Telephone Encounter (Signed)
Can you check on this for me and refax?

## 2017-06-27 NOTE — Telephone Encounter (Signed)
I called and left msg as [name] was not available. Need to know date of the poc she is needing.

## 2017-07-03 ENCOUNTER — Ambulatory Visit (INDEPENDENT_AMBULATORY_CARE_PROVIDER_SITE_OTHER): Payer: Medicare Other | Admitting: Orthopaedic Surgery

## 2017-07-03 VITALS — BP 119/77 | HR 57

## 2017-07-03 DIAGNOSIS — Z96642 Presence of left artificial hip joint: Secondary | ICD-10-CM

## 2017-07-03 NOTE — Progress Notes (Signed)
Post-Op Visit Note   Patient: [name]           Date of Birth: Feb 15, 1933           MRN: [id number] Visit Date: 07/03/2017 PCP: Glendale Chard, MD   Assessment & Plan: Postop total hip arthroplasty left hip direct anterior approach. She's 1/2 inch long has had shoes with buildup and seems to be ambulating better. She is on a single-point cane and is gotten off her were walker. She still has lower extremity weakness with quad weakness and the has to be very careful when she walks to avoid falling. I think she would benefit from outpatient therapy for lower extremity strengthening and gait work. I'll recheck her in one month.  Chief Complaint:  Chief Complaint  Patient presents with  . Left Hip - Pain, Follow-up   Visit Diagnoses:  1. Status post total replacement of left hip     Plan: We'll set up for outpatient physical therapy recheck 1 month.  Follow-Up Instructions: Return in about 1 month (around 08/02/2017).   Orders:  No orders of the defined types were placed in this encounter.  No orders of the defined types were placed in this encounter.   Imaging: No results found.  PMFS History: Patient Active Problem List   Diagnosis Date Noted  . Presence of left artificial hip joint 05/14/2017  . Arthritis of left hip 04/05/2017  . Preop cardiovascular exam 03/12/2017  . Cough   . Abdominal pain   . Symptomatic cholelithiasis 07/01/2016  . Nonspecific ST-T wave electrocardiographic changes   . Lower leg edema 02/16/2015  . Obesity (BMI 30.0-34.9) 02/16/2015  . Osteoporosis 01/11/2012  . Essential hypertension 01/11/2012  . Hyperthyroidism 01/11/2012  . Hypercholesteremia 01/11/2012  . H/O vaginal hysterectomy 1974 01/11/2012    Class: History of  . S/P removal of lung  partial R lobe  2010 01/11/2012  . History of hernia repair  R ing. 1975 01/11/2012  . BACK PAIN 11/12/2007  . DYSPNEA 11/11/2007   Past Medical History:  Diagnosis Date  . Arthritis    left hip and back  . Bronchiectasis    isolated to RML; status post right middle lobe partial resection.  . Complication of anesthesia    hard to wake up  . Dyspnea   . Gall stones   . GERD (gastroesophageal reflux disease)    history   . H/O osteoporosis   . Hypertension   . Hypothyroidism   . Yeast infection     Family History  Problem Relation Age of Onset  . Hypertension Mother   . CVA Mother 79  . Heart attack Sister   . Diabetes Sister   . Heart attack Brother   . Heart attack Brother     Past Surgical History:  Procedure Laterality Date  . ABDOMINAL HYSTERECTOMY  1974  . CHOLECYSTECTOMY N/A 07/02/2016   Procedure: LAPAROSCOPIC CHOLECYSTECTOMY;  Surgeon: Stark Klein, MD;  Location: [facility];  Service: General;  Laterality: N/A;  . COLONOSCOPY    . EYE SURGERY     stye removed  . HERNIA REPAIR  1975  . KNEE SURGERY Left   . LUNG REMOVAL, PARTIAL  208   RML  . NM MYOVIEW LTD  06/2011   Normal EF. No ischemia or infarction.  Marland Kitchen TOTAL HIP ARTHROPLASTY Left 04/05/2017   Procedure: LEFT TOTAL HIP ARTHROPLASTY ANTERIOR APPROACH;  Surgeon: [name], MD;  Location: [facility];  Service: Orthopedics;  Laterality: Left;  .  TRANSTHORACIC ECHOCARDIOGRAM  06/2011   Mild concentric LVH. Normal EF with impaired relaxation. Mildly elevated RV pressures of 30 and 40 mmHg.  Marland Kitchen TRANSTHORACIC ECHOCARDIOGRAM  06/2016   normal LV size and function. EF 60-65% with GRD 2 DD. Mild aortic stenosis. Mild LA dilation. Mild to moderately increased PA pressures (46 mmHg).   Social History   Occupational History  . Not on file.   Social History Main Topics  . Smoking status: Never Smoker  . Smokeless tobacco: Never Used  . Alcohol use No  . Drug use: No  . Sexual activity: No

## 2017-07-03 NOTE — Addendum Note (Signed)
Addended by: Meyer Cory on: 07/03/2017 12:18 PM   Modules accepted: Orders

## 2017-07-08 ENCOUNTER — Ambulatory Visit: Payer: Medicare Other | Attending: Orthopaedic Surgery | Admitting: Physical Therapy

## 2017-07-08 ENCOUNTER — Encounter: Payer: Self-pay | Admitting: Physical Therapy

## 2017-07-08 DIAGNOSIS — M25652 Stiffness of left hip, not elsewhere classified: Secondary | ICD-10-CM | POA: Diagnosis not present

## 2017-07-08 DIAGNOSIS — M25552 Pain in left hip: Secondary | ICD-10-CM | POA: Diagnosis not present

## 2017-07-08 DIAGNOSIS — R262 Difficulty in walking, not elsewhere classified: Secondary | ICD-10-CM | POA: Diagnosis not present

## 2017-07-08 NOTE — Patient Instructions (Signed)
Patient asked to bring HHPT HEP to next session.   Printed a Best boy of a total hip arthroplasty for the patient upon request.

## 2017-07-08 NOTE — Therapy (Signed)
[place], [place], [address] Phone: [phone]   Fax:  [phone]  Physical Therapy Evaluation  Patient Details  Name: [name] MRN: [id number] Date of Birth: December 06, 1932 Referring Provider: Dr. Rodell Perna   Encounter Date: 07/08/2017      PT End of Session - 07/08/17 1307    Visit Number 1   Number of Visits 12   Date for PT Re-Evaluation 08/23/17   PT Start Time 0930   PT Stop Time 1015   PT Time Calculation (min) 45 min   Activity Tolerance Patient tolerated treatment well   Behavior During Therapy [facility] for tasks assessed/performed      Past Medical History:  Diagnosis Date  . Arthritis    left hip and back  . Bronchiectasis    isolated to RML; status post right middle lobe partial resection.  . Complication of anesthesia    hard to wake up  . Dyspnea   . Gall stones   . GERD (gastroesophageal reflux disease)    history   . H/O osteoporosis   . Hypertension   . Hypothyroidism   . Yeast infection     Past Surgical History:  Procedure Laterality Date  . ABDOMINAL HYSTERECTOMY  1974  . CHOLECYSTECTOMY N/A 07/02/2016   Procedure: LAPAROSCOPIC CHOLECYSTECTOMY;  Surgeon: Stark Klein, MD;  Location: [place];  Service: General;  Laterality: N/A;  . COLONOSCOPY    . EYE SURGERY     stye removed  . HERNIA REPAIR  1975  . KNEE SURGERY Left   . LUNG REMOVAL, PARTIAL  208   RML  . NM MYOVIEW LTD  06/2011   Normal EF. No ischemia or infarction.  Marland Kitchen TOTAL HIP ARTHROPLASTY Left 04/05/2017   Procedure: LEFT TOTAL HIP ARTHROPLASTY ANTERIOR APPROACH;  Surgeon: [name], MD;  Location: [place];  Service: Orthopedics;  Laterality: Left;  . TRANSTHORACIC ECHOCARDIOGRAM  06/2011   Mild concentric LVH. Normal EF with impaired relaxation. Mildly elevated RV pressures of 30 and 40 mmHg.  Marland Kitchen TRANSTHORACIC ECHOCARDIOGRAM  06/2016   normal LV size and function. EF 60-65% with GRD 2 DD. Mild aortic  stenosis. Mild LA dilation. Mild to moderately increased PA pressures (46 mmHg).    There were no vitals filed for this visit.       Subjective Assessment - 07/08/17 0937    Subjective Pt had THA on LLE on 04/05/35 without complications.  She had HHPT for a few weeks.  Continue to have pain (soreness) in L hip which radiates to L knee.  She describes a leg length discrepancy which she is still getting used to.  She had her Rt. shoe built up (Rt leg shorter) .     Pertinent History Back pain, L knee surgery   Limitations Sitting;Lifting;Standing;Walking;Other (comment);House hold activities  sleep, does not do heavier housework    How long can you sit comfortably? 2 hours    How long can you stand comfortably? <30 min , unsure    How long can you walk comfortably? unsure    Diagnostic tests XR    Patient Stated Goals Patient would like to get comfortable walking.     Currently in Pain? Yes   Pain Score 5    Pain Location Hip   Pain Orientation Left;Posterior;Lateral   Pain Descriptors / Indicators Sore   Pain Type Chronic pain   Pain Onset More than a month ago   Pain Frequency Constant  Aggravating Factors  sitting too long    Pain Relieving Factors change positions, Tylenol , ice    Effect of Pain on Daily Activities comfort , and ability to walk             [facility] PT Assessment - 07/08/17 0001      Assessment   Medical Diagnosis L THA anterior approach    Referring Provider Dr. Rodell Perna    Onset Date/Surgical Date 04/05/17   Next MD Visit 4 weeks    Prior Therapy HHPT      Precautions   Precautions Anterior Hip     Restrictions   Weight Bearing Restrictions No     Balance Screen   Has the patient fallen in the past 6 months No   Has the patient had a decrease in activity level because of a fear of falling?  Yes  since surgery, uses walker in home    Is the patient reluctant to leave their home because of a fear of falling?  No     Home Environment   Living  Environment Private residence   Living Arrangements Alone   Available Help at Discharge Family   Type of [place] Two level;Able to live on main level with bedroom/bathroom   Alternate Level Stairs-Number of Steps 12   Alternate Level Stairs-Rails Right   Home Equipment Walker - 2 wheels;Walker - 4 wheels;Cane - single point     Prior Function   Level of Independence Independent;Independent with household mobility with device;Independent with community mobility with device;Independent with homemaking with ambulation   Vocation Retired   [employer] was [employer]    Leisure family, friends, shopping      Observation/Other Assessments   Focus on Therapeutic Outcomes (FOTO)  55%     Sensation   Light Touch Impaired by gross assessment   Additional Comments numbness in Lateral thigh improving (also in posterior thigh)      Coordination   Gross Motor Movements are Fluid and Coordinated Not tested     Functional Tests   Functional tests Single leg stance;Sit to Stand     Single Leg Stance   Comments <10 sec bilat.      Sit to Stand   Comments No UE needed from slighltly elevated surface     Posture/Postural Control   Postural Limitations Rounded Shoulders;Forward head;Decreased lumbar lordosis;Increased thoracic kyphosis;Flexed trunk;Weight shift right     AROM   Overall AROM Comments lacks full trunk ext in standing     PROM   Overall PROM Comments PROM L hip flexion to 115 deg, ER approx 25 deg, IR 15 deg      Strength   Right Hip Flexion 4/5   Right Hip ABduction 3-/5   Left Hip Flexion 4/5  unable to do SLR    Left Hip ABduction 3+/5     Palpation   Palpation comment sore to the touch anterior hip and laterally, posteriorly into gluteals and hamstrings      Ambulation/Gait   Ambulation Distance (Feet) 150 Feet   Assistive device Straight cane   Gait Pattern Decreased stance time - left;Left circumduction;Antalgic    Ambulation Surface Level;Indoor            Objective measurements completed on examination: See above findings.          [facility] Adult PT Treatment/Exercise - 07/08/17 0001      Transfers   Five  time sit to stand comments  18 sec      Self-Care   Self-Care Heat/Ice Application;Other Self-Care Comments   Heat/Ice Application apply as needed   Other Self-Care Comments  HEP, standing posture      Knee/Hip Exercises: Supine   Bridges Strengthening;Both;1 set   Straight Leg Raises Left;1 set                PT Education - 07/08/17 1306    Education provided Yes   Education Details PT/POC, HEP, THA   Person(s) Educated Patient   Methods Explanation;Handout   Comprehension Verbalized understanding;Returned demonstration;Verbal cues required;Need further instruction             PT Long Term Goals - 07/08/17 1321      PT LONG TERM GOAL #1   Title Pt will be I with HEP for hip and core   Time 6   Period Weeks   Status New   Target Date 08/23/17     PT LONG TERM GOAL #2   Title Pt will be able to sit for church activities >1 hour without increased pain.    Time 6   Period Weeks   Status New   Target Date 08/23/17     PT LONG TERM GOAL #3   Title Pt will be able to demo hip abduction strength 4/5 for improved gait stabilty.    Time 6   Period Weeks   Status New   Target Date 08/23/17     PT LONG TERM GOAL #4   Title Pt will be able to go up and down stairs at her home with confidence and no increase in hip pain as needed.    Time 6   Period Weeks   Status New   Target Date 08/23/17     PT LONG TERM GOAL #5   Title Pt will be able to do SLR on LLE with ease and no increase in hip pain.    Time 6   Period Weeks   Status New   Target Date 08/23/17                Plan - 07/08/17 1312    Clinical Impression Statement Patient presents for low complexity eval of L total hip arthroplasty which continues to limit her mobility.  She  continues to have pain in her L hip with sitting, standing too long.  She also has pain in her Rt. hip and that continues to affect her mobility.  She will benefit from PT and should do well for strengthening, pain control and more functional mobility.    Clinical Presentation Stable   Clinical Decision Making Low   Rehab Potential Excellent   PT Frequency 2x / week   PT Duration 6 weeks   PT Treatment/Interventions ADLs/Self Care Home Management;Cryotherapy;Electrical Stimulation;Moist Heat;Gait training;Stair training;Functional mobility training;Therapeutic activities;Therapeutic exercise;Balance training;Passive range of motion;Patient/family education;Manual techniques;Neuromuscular re-education;Taping   PT Next Visit Plan pt to bring in HEP from HHPT: standing hip strength, SLR, modalities and manual for pain    PT Home Exercise Plan HHPT   Consulted and Agree with Plan of Care Patient      Patient will benefit from skilled therapeutic intervention in order to improve the following deficits and impairments:  Abnormal gait, Increased fascial restricitons, Pain, Postural dysfunction, Decreased mobility, Decreased activity tolerance, Decreased range of motion, Decreased strength, Difficulty walking, Decreased balance, Obesity  Visit Diagnosis: Pain in left hip  Stiffness of hip joint, left  Difficulty in walking, not elsewhere classified      G-Codes - 2017/07/17 1326    Functional Assessment Tool Used (Outpatient Only) FOTO/clinical judgement    Functional Limitation Mobility: Walking and moving around   Mobility: Walking and Moving Around Current Status [phone]) At least 60 percent but less than 80 percent impaired, limited or restricted   Mobility: Walking and Moving Around Goal Status [phone]) At least 40 percent but less than 60 percent impaired, limited or restricted       Problem List Patient Active Problem List   Diagnosis Date Noted  . Presence of left artificial hip joint  05/14/2017  . Preop cardiovascular exam 03/12/2017  . Cough   . Abdominal pain   . Symptomatic cholelithiasis 07/01/2016  . Nonspecific ST-T wave electrocardiographic changes   . Lower leg edema 02/16/2015  . Obesity (BMI 30.0-34.9) 02/16/2015  . Osteoporosis 01/11/2012  . Essential hypertension 01/11/2012  . Hyperthyroidism 01/11/2012  . Hypercholesteremia 01/11/2012  . H/O vaginal hysterectomy 1974 01/11/2012    Class: History of  . S/P removal of lung  partial R lobe  2010 01/11/2012  . History of hernia repair  R ing. 1975 01/11/2012  . BACK PAIN 11/12/2007  . DYSPNEA 11/11/2007    Werner Labella 2017/07/17, 1:30 PM  [facility] [address] [place], [place], [address] Phone: [phone]   Fax:  [phone]  Name: [name] MRN: [id number] Date of Birth: 05-11-1933   Raeford Razor, PT 17-Jul-2017 1:30 PM Phone: [phone] Fax: [phone]

## 2017-07-11 ENCOUNTER — Ambulatory Visit: Payer: Medicare Other

## 2017-07-11 DIAGNOSIS — R262 Difficulty in walking, not elsewhere classified: Secondary | ICD-10-CM | POA: Diagnosis not present

## 2017-07-11 DIAGNOSIS — M25652 Stiffness of left hip, not elsewhere classified: Secondary | ICD-10-CM | POA: Diagnosis not present

## 2017-07-11 DIAGNOSIS — M25552 Pain in left hip: Secondary | ICD-10-CM

## 2017-07-11 NOTE — Therapy (Signed)
[place], [place], [address] Phone: [phone]   Fax:  [phone]  Physical Therapy Treatment  Patient Details  Name: [name] MRN: [id number] Date of Birth: 1933-08-29 Referring Provider: Dr. Rodell Perna   Encounter Date: 07/11/2017      PT End of Session - 07/11/17 1330    Visit Number 2   Number of Visits 12   Date for PT Re-Evaluation 08/23/17   PT Start Time 0130   PT Stop Time 0215   PT Time Calculation (min) 45 min   Activity Tolerance Patient tolerated treatment well   Behavior During Therapy [facility] for tasks assessed/performed      Past Medical History:  Diagnosis Date  . Arthritis    left hip and back  . Bronchiectasis    isolated to RML; status post right middle lobe partial resection.  . Complication of anesthesia    hard to wake up  . Dyspnea   . Gall stones   . GERD (gastroesophageal reflux disease)    history   . H/O osteoporosis   . Hypertension   . Hypothyroidism   . Yeast infection     Past Surgical History:  Procedure Laterality Date  . ABDOMINAL HYSTERECTOMY  1974  . CHOLECYSTECTOMY N/A 07/02/2016   Procedure: LAPAROSCOPIC CHOLECYSTECTOMY;  Surgeon: Stark Klein, MD;  Location: [place];  Service: General;  Laterality: N/A;  . COLONOSCOPY    . EYE SURGERY     stye removed  . HERNIA REPAIR  1975  . KNEE SURGERY Left   . LUNG REMOVAL, PARTIAL  208   RML  . NM MYOVIEW LTD  06/2011   Normal EF. No ischemia or infarction.  Marland Kitchen TOTAL HIP ARTHROPLASTY Left 04/05/2017   Procedure: LEFT TOTAL HIP ARTHROPLASTY ANTERIOR APPROACH;  Surgeon: [name], MD;  Location: [place];  Service: Orthopedics;  Laterality: Left;  . TRANSTHORACIC ECHOCARDIOGRAM  06/2011   Mild concentric LVH. Normal EF with impaired relaxation. Mildly elevated RV pressures of 30 and 40 mmHg.  Marland Kitchen TRANSTHORACIC ECHOCARDIOGRAM  06/2016   normal LV size and function. EF 60-65% with GRD 2 DD. Mild aortic  stenosis. Mild LA dilation. Mild to moderately increased PA pressures (46 mmHg).    There were no vitals filed for this visit.      Subjective Assessment - 07/11/17 1335    Subjective No pain Lt hip .    More pain anteriorly RT hip and anterior RT knee. TKA  and THA Lt.   She did HEP but is doing them less.  She is doing home tasks.    Currently in Pain? Yes   Pain Score 5    Pain Location Hip   Pain Orientation Right  left mildly sore   Pain Descriptors / Indicators Sore   Pain Type Chronic pain   Pain Onset More than a month ago   Pain Frequency Intermittent   Aggravating Factors  sitting and getting   Pain Relieving Factors change positions   Multiple Pain Sites --  also rT knee                         [facility] Adult PT Treatment/Exercise - 07/11/17 0001      Ambulation/Gait   Pre-Gait Activities Worked on getting LE equal with LE extension  limited by decr RT hip flexion     Self-Care   Other Self-Care Comments  spent time assessing need  for added heel lift in RT shoe  anlong with posture      Knee/Hip Exercises: Stretches   Hip Flexor Stretch Right;Left;2 reps;10 seconds   Hip Flexor Stretch Limitations unable to tolerate RT hip extension     Knee/Hip Exercises: Aerobic   Nustep 9 min LE only  L3                PT Education - 07/11/17 1459    Education provided Yes   Education Details Reviewed perception of shortness or RT LE and how weakness and decr RT hip ext makes the RT leg appear shorter   Person(s) Educated Patient   Methods Explanation;Tactile cues;Verbal cues   Comprehension Verbalized understanding;Returned demonstration             PT Long Term Goals - 07/08/17 1321      PT LONG TERM GOAL #1   Title Pt will be I with HEP for hip and core   Time 6   Period Weeks   Status New   Target Date 08/23/17     PT LONG TERM GOAL #2   Title Pt will be able to sit for church activities >1 hour without increased pain.    Time 6    Period Weeks   Status New   Target Date 08/23/17     PT LONG TERM GOAL #3   Title Pt will be able to demo hip abduction strength 4/5 for improved gait stabilty.    Time 6   Period Weeks   Status New   Target Date 08/23/17     PT LONG TERM GOAL #4   Title Pt will be able to go up and down stairs at her home with confidence and no increase in hip pain as needed.    Time 6   Period Weeks   Status New   Target Date 08/23/17     PT LONG TERM GOAL #5   Title Pt will be able to do SLR on LLE with ease and no increase in hip pain.    Time 6   Period Weeks   Status New   Target Date 08/23/17               Plan - 07/11/17 1501    Clinical Impression Statement [name] was reluctant to discuss pain related to RT leg and that Lt hip /leg did not have much pain only mild soreness so spent time talking about the importance of knowing where she is having pain and to be able to help her. She needs to return to more HEP and we will asses what is good for RT leg also.   RT leg due to apparent significant OA RT hip may be limiting factor on progress.   Understandable she is reluctant to have a surgery  as she has had muchpain after last hip surgery   PT Treatment/Interventions ADLs/Self Care Home Management;Cryotherapy;Electrical Stimulation;Moist Heat;Gait training;Stair training;Functional mobility training;Therapeutic activities;Therapeutic exercise;Balance training;Passive range of motion;Patient/family education;Manual techniques;Neuromuscular re-education;Taping   PT Next Visit Plan : standing hip strength, SLR, modalities and manual for pain and ROM RT hip , long axis pulls RT hip   PT Home Exercise Plan HHPT: QS, heel isometrics knee flex, abduction supine, bridgeing   Consulted and Agree with Plan of Care Patient      Patient will benefit from skilled therapeutic intervention in order to improve the following deficits and impairments:  Abnormal gait, Increased fascial  restricitons, Pain, Postural dysfunction,  Decreased mobility, Decreased activity tolerance, Decreased range of motion, Decreased strength, Difficulty walking, Decreased balance, Obesity  Visit Diagnosis: Pain in left hip  Stiffness of hip joint, left  Difficulty in walking, not elsewhere classified     Problem List Patient Active Problem List   Diagnosis Date Noted  . Presence of left artificial hip joint 05/14/2017  . Preop cardiovascular exam 03/12/2017  . Cough   . Abdominal pain   . Symptomatic cholelithiasis 07/01/2016  . Nonspecific ST-T wave electrocardiographic changes   . Lower leg edema 02/16/2015  . Obesity (BMI 30.0-34.9) 02/16/2015  . Osteoporosis 01/11/2012  . Essential hypertension 01/11/2012  . Hyperthyroidism 01/11/2012  . Hypercholesteremia 01/11/2012  . H/O vaginal hysterectomy 1974 01/11/2012    Class: History of  . S/P removal of lung  partial R lobe  2010 01/11/2012  . History of hernia repair  R ing. 1975 01/11/2012  . BACK PAIN 11/12/2007  . DYSPNEA 11/11/2007    Darrel Hoover  PT 07/11/2017, 3:17 PM  [facility] [address] [place], [place], [address] Phone: [phone]   Fax:  [phone]  Name: TAJUANA KNISKERN MRN: [id number] Date of Birth: 05/23/1933

## 2017-07-15 ENCOUNTER — Ambulatory Visit: Payer: Medicare Other | Admitting: Physical Therapy

## 2017-07-15 DIAGNOSIS — M25552 Pain in left hip: Secondary | ICD-10-CM

## 2017-07-15 DIAGNOSIS — R262 Difficulty in walking, not elsewhere classified: Secondary | ICD-10-CM

## 2017-07-15 DIAGNOSIS — M25652 Stiffness of left hip, not elsewhere classified: Secondary | ICD-10-CM

## 2017-07-15 NOTE — Therapy (Signed)
[place], [place], [address] Phone: [phone]   Fax:  [phone]  Physical Therapy Treatment  Patient Details  Name: [name] MRN: [id number] Date of Birth: 1932-12-12 Referring Provider: Dr. Rodell Perna   Encounter Date: 07/15/2017      PT End of Session - 07/15/17 1101    Visit Number 3   Number of Visits 12   Date for PT Re-Evaluation 08/23/17   PT Start Time 1055   PT Stop Time 1152   PT Time Calculation (min) 57 min   Activity Tolerance Patient tolerated treatment well   Behavior During Therapy [facility] for tasks assessed/performed      Past Medical History:  Diagnosis Date  . Arthritis    left hip and back  . Bronchiectasis    isolated to RML; status post right middle lobe partial resection.  . Complication of anesthesia    hard to wake up  . Dyspnea   . Gall stones   . GERD (gastroesophageal reflux disease)    history   . H/O osteoporosis   . Hypertension   . Hypothyroidism   . Yeast infection     Past Surgical History:  Procedure Laterality Date  . ABDOMINAL HYSTERECTOMY  1974  . CHOLECYSTECTOMY N/A 07/02/2016   Procedure: LAPAROSCOPIC CHOLECYSTECTOMY;  Surgeon: Stark Klein, MD;  Location: [place];  Service: General;  Laterality: N/A;  . COLONOSCOPY    . EYE SURGERY     stye removed  . HERNIA REPAIR  1975  . KNEE SURGERY Left   . LUNG REMOVAL, PARTIAL  208   RML  . NM MYOVIEW LTD  06/2011   Normal EF. No ischemia or infarction.  Marland Kitchen TOTAL HIP ARTHROPLASTY Left 04/05/2017   Procedure: LEFT TOTAL HIP ARTHROPLASTY ANTERIOR APPROACH;  Surgeon: [name], MD;  Location: [place];  Service: Orthopedics;  Laterality: Left;  . TRANSTHORACIC ECHOCARDIOGRAM  06/2011   Mild concentric LVH. Normal EF with impaired relaxation. Mildly elevated RV pressures of 30 and 40 mmHg.  Marland Kitchen TRANSTHORACIC ECHOCARDIOGRAM  06/2016   normal LV size and function. EF 60-65% with GRD 2 DD. Mild aortic  stenosis. Mild LA dilation. Mild to moderately increased PA pressures (46 mmHg).    There were no vitals filed for this visit.      Subjective Assessment - 07/15/17 1058    Subjective Lt hip is sore.  Rt. hip is stiff when i sit too long.  Ive been stretching my Rt. hip and "Its killing me"   Patient Stated Goals I want to walk without the stick    Currently in Pain? Yes   Pain Score 3    Pain Location Hip   Pain Orientation Right   Pain Descriptors / Indicators Sore   Pain Type Chronic pain   Pain Onset More than a month ago   Pain Frequency Intermittent   Aggravating Factors  sitting too long, stretching    Pain Relieving Factors changing positions                         [facility] Adult PT Treatment/Exercise - 07/15/17 0001      Knee/Hip Exercises: Stretches   Hip Flexor Stretch Right;2 reps;60 seconds     Knee/Hip Exercises: Aerobic   Nustep LE only 5 min L 5     Knee/Hip Exercises: Standing   Wall Squat 10 reps   Other Standing Knee Exercises weightshifts each  leg x 8 for ant hip stretch, posture     Knee/Hip Exercises: Supine   Quad Sets Strengthening;Both;1 set   Bridges Strengthening;Both;1 set;15 reps   Other Supine Knee/Hip Exercises Glute set x 10 , 5 sec    Other Supine Knee/Hip Exercises hip/knee x 10 each leg for ant hip strength      Knee/Hip Exercises: Sidelying   Hip ABduction Strengthening;Both;2 sets;10 reps   Clams x 15 reps bilateral     Modalities   Modalities Moist Heat     Moist Heat Therapy   Number Minutes Moist Heat 10 Minutes   Moist Heat Location Hip  bilaterally                PT Education - 07/15/17 1147    Education provided Yes   Education Details new upgraded HEP    Person(s) Educated Patient   Methods Explanation;Handout;Verbal cues   Comprehension Verbalized understanding;Verbal cues required;Need further instruction             PT Long Term Goals - 07/15/17 1259      PT LONG TERM GOAL #1    Title Pt will be I with HEP for hip and core   Status On-going     PT LONG TERM GOAL #2   Title Pt will be able to sit for church activities >1 hour without increased pain.    Status On-going     PT LONG TERM GOAL #3   Title Pt will be able to demo hip abduction strength 4/5 for improved gait stabilty.    Status On-going     PT LONG TERM GOAL #4   Title Pt will be able to go up and down stairs at her home with confidence and no increase in hip pain as needed.    Status On-going     PT LONG TERM GOAL #5   Title Pt will be able to do SLR on LLE with ease and no increase in hip pain.    Status On-going               Plan - 07/15/17 1302    Clinical Impression Statement Patient limited by Rt. hip pain/groin with extension.  She brought in her HHPT HEP and we modified it, added actual pictures. She was tired at the end of session, but no increase in pain.  Applied heat to each hip for relief of soreness.    PT Next Visit Plan : standing hip strength, SLR, modalities and manual for pain and ROM RT hip , long axis pulls RT hip. Try mirror, standing hip extension.    PT Home Exercise Plan HHPT: QS, glute sets, abduction sidelying, bridging, LTR , Rt. ant hip stretching off the bed.    Consulted and Agree with Plan of Care Patient      Patient will benefit from skilled therapeutic intervention in order to improve the following deficits and impairments:  Abnormal gait, Increased fascial restricitons, Pain, Postural dysfunction, Decreased mobility, Decreased activity tolerance, Decreased range of motion, Decreased strength, Difficulty walking, Decreased balance, Obesity  Visit Diagnosis: Pain in left hip  Stiffness of hip joint, left  Difficulty in walking, not elsewhere classified     Problem List Patient Active Problem List   Diagnosis Date Noted  . Presence of left artificial hip joint 05/14/2017  . Preop cardiovascular exam 03/12/2017  . Cough   . Abdominal pain   .  Symptomatic cholelithiasis 07/01/2016  . Nonspecific ST-T wave electrocardiographic changes   .  Lower leg edema 02/16/2015  . Obesity (BMI 30.0-34.9) 02/16/2015  . Osteoporosis 01/11/2012  . Essential hypertension 01/11/2012  . Hyperthyroidism 01/11/2012  . Hypercholesteremia 01/11/2012  . H/O vaginal hysterectomy 1974 01/11/2012    Class: History of  . S/P removal of lung  partial R lobe  2010 01/11/2012  . History of hernia repair  R ing. 1975 01/11/2012  . BACK PAIN 11/12/2007  . DYSPNEA 11/11/2007    Ligia Duguay 07/15/2017, 1:08 PM  [facility] [address] [place], [place], [address] Phone: [phone]   Fax:  [phone]  Name: [name] MRN: [id number] Date of Birth: March 14, 1933  Raeford Razor, PT 07/15/17 1:09 PM Phone: [phone] Fax: [phone]

## 2017-07-18 ENCOUNTER — Ambulatory Visit: Payer: Medicare Other | Admitting: Physical Therapy

## 2017-07-18 ENCOUNTER — Encounter: Payer: Self-pay | Admitting: Physical Therapy

## 2017-07-18 DIAGNOSIS — M25552 Pain in left hip: Secondary | ICD-10-CM

## 2017-07-18 DIAGNOSIS — R262 Difficulty in walking, not elsewhere classified: Secondary | ICD-10-CM | POA: Diagnosis not present

## 2017-07-18 DIAGNOSIS — M25652 Stiffness of left hip, not elsewhere classified: Secondary | ICD-10-CM | POA: Diagnosis not present

## 2017-07-18 NOTE — Therapy (Signed)
[place], [place], [address] Phone: [phone]   Fax:  [phone]  Physical Therapy Treatment  Patient Details  Name: [name] MRN: [id number] Date of Birth: [birth year] Referring Provider: Dr. Rodell Perna   Encounter Date: 07/18/2017      PT End of Session - 07/18/17 1146    Visit Number 4   Number of Visits 12   Date for PT Re-Evaluation 08/23/17   PT Start Time 1100   PT Stop Time 1156   PT Time Calculation (min) 56 min   Activity Tolerance Patient tolerated treatment well   Behavior During Therapy [facility] for tasks assessed/performed      Past Medical History:  Diagnosis Date  . Arthritis    left hip and back  . Bronchiectasis    isolated to RML; status post right middle lobe partial resection.  . Complication of anesthesia    hard to wake up  . Dyspnea   . Gall stones   . GERD (gastroesophageal reflux disease)    history   . H/O osteoporosis   . Hypertension   . Hypothyroidism   . Yeast infection     Past Surgical History:  Procedure Laterality Date  . ABDOMINAL HYSTERECTOMY  1974  . CHOLECYSTECTOMY N/A 07/02/2016   Procedure: LAPAROSCOPIC CHOLECYSTECTOMY;  Surgeon: Stark Klein, MD;  Location: [place];  Service: General;  Laterality: N/A;  . COLONOSCOPY    . EYE SURGERY     stye removed  . HERNIA REPAIR  1975  . KNEE SURGERY Left   . LUNG REMOVAL, PARTIAL  208   RML  . NM MYOVIEW LTD  06/2011   Normal EF. No ischemia or infarction.  Marland Kitchen TOTAL HIP ARTHROPLASTY Left 04/05/2017   Procedure: LEFT TOTAL HIP ARTHROPLASTY ANTERIOR APPROACH;  Surgeon: [name], MD;  Location: [place];  Service: Orthopedics;  Laterality: Left;  . TRANSTHORACIC ECHOCARDIOGRAM  06/2011   Mild concentric LVH. Normal EF with impaired relaxation. Mildly elevated RV pressures of 30 and 40 mmHg.  Marland Kitchen TRANSTHORACIC ECHOCARDIOGRAM  06/2016   normal LV size and function. EF 60-65% with GRD 2 DD. Mild aortic  stenosis. Mild LA dilation. Mild to moderately increased PA pressures (46 mmHg).    There were no vitals filed for this visit.      Subjective Assessment - 07/18/17 1105    Subjective No pain today, Ive been working with my Rt. leg.    Currently in Pain? No/denies             [facility] Adult PT Treatment/Exercise - 07/18/17 0001      High Level Balance   High Level Balance Activities Side stepping;Backward walking;Marching forwards;Marching backwards   High Level Balance Comments used hands minimally in bars      Lumbar Exercises: Stretches   Single Knee to Chest Stretch 3 reps;30 seconds   Single Knee to Chest Stretch Limitations used towel to assist    Lower Trunk Rotation 10 seconds   Lower Trunk Rotation Limitations knees apart for hip ER/IR      Knee/Hip Exercises: Stretches   Hip Flexor Stretch Both;3 reps;30 seconds   Hip Flexor Stretch Limitations standing in bars, runners ant hip stretch      Knee/Hip Exercises: Aerobic   Nustep UE and LE for 6 min level 5      Knee/Hip Exercises: Standing   Heel Raises Both;1 set;20 reps   Functional Squat 1 set;10 reps   SLS  with Vectors hip abd and ext with visual cues in parallel bars x 10 each leg      Knee/Hip Exercises: Supine   Other Supine Knee/Hip Exercises lg lengthener x 10, 5 sec      Modalities   Modalities Moist Heat     Moist Heat Therapy   Number Minutes Moist Heat 10 Minutes   Moist Heat Location Hip  bilaterally     Manual Therapy   Manual Therapy Soft tissue mobilization   Manual therapy comments with roller, manual to prox hip, long axis distraction  with abd (slight)                 PT Education - 07/18/17 1144    Education provided No             PT Long Term Goals - 07/15/17 1259      PT LONG TERM GOAL #1   Title Pt will be I with HEP for hip and core   Status On-going     PT LONG TERM GOAL #2   Title Pt will be able to sit for church activities >1 hour without increased  pain.    Status On-going     PT LONG TERM GOAL #3   Title Pt will be able to demo hip abduction strength 4/5 for improved gait stabilty.    Status On-going     PT LONG TERM GOAL #4   Title Pt will be able to go up and down stairs at her home with confidence and no increase in hip pain as needed.    Status On-going     PT LONG TERM GOAL #5   Title Pt will be able to do SLR on LLE with ease and no increase in hip pain.    Status On-going               Plan - 07/18/17 1147    Clinical Impression Statement Patient worked hard today, able to work on strengthening in closed chain with mod pain increase.  She restated that she does NOT want to have Rt. hip surgery.  She has been dong her exercises at home.  Progressing.    PT Next Visit Plan : standing hip strength, SLR, modalities and manual for pain and ROM RT hip , long axis pulls RT hip. Try mirror, standing hip extension.    PT Home Exercise Plan HHPT: QS, glute sets, abduction sidelying, bridging, LTR , Rt. ant hip stretching off the bed.    Consulted and Agree with Plan of Care Patient      Patient will benefit from skilled therapeutic intervention in order to improve the following deficits and impairments:  Abnormal gait, Increased fascial restricitons, Pain, Postural dysfunction, Decreased mobility, Decreased activity tolerance, Decreased range of motion, Decreased strength, Difficulty walking, Decreased balance, Obesity  Visit Diagnosis: Pain in left hip  Stiffness of hip joint, left  Difficulty in walking, not elsewhere classified     Problem List Patient Active Problem List   Diagnosis Date Noted  . Presence of left artificial hip joint 05/14/2017  . Preop cardiovascular exam 03/12/2017  . Cough   . Abdominal pain   . Symptomatic cholelithiasis 07/01/2016  . Nonspecific ST-T wave electrocardiographic changes   . Lower leg edema 02/16/2015  . Obesity (BMI 30.0-34.9) 02/16/2015  . Osteoporosis 01/11/2012  .  Essential hypertension 01/11/2012  . Hyperthyroidism 01/11/2012  . Hypercholesteremia 01/11/2012  . H/O vaginal hysterectomy 1974 01/11/2012    Class:  History of  . S/P removal of lung  partial R lobe  2010 01/11/2012  . History of hernia repair  R ing. 1975 01/11/2012  . BACK PAIN 11/12/2007  . DYSPNEA 11/11/2007    Moksh Loomer 07/18/2017, 11:49 AM  [facility] [address] [place], [place], [address] Phone: [phone]   Fax:  [phone]  Name: [name] MRN: [id number] Date of Birth: 28-Sep-1933   Raeford Razor, PT 07/18/17 11:49 AM Phone: [phone] Fax: [phone]

## 2017-07-22 ENCOUNTER — Ambulatory Visit: Payer: Medicare Other | Admitting: Physical Therapy

## 2017-07-22 ENCOUNTER — Encounter: Payer: Self-pay | Admitting: Physical Therapy

## 2017-07-22 DIAGNOSIS — R262 Difficulty in walking, not elsewhere classified: Secondary | ICD-10-CM

## 2017-07-22 DIAGNOSIS — M25652 Stiffness of left hip, not elsewhere classified: Secondary | ICD-10-CM

## 2017-07-22 DIAGNOSIS — M25552 Pain in left hip: Secondary | ICD-10-CM

## 2017-07-22 NOTE — Therapy (Signed)
[place], [place], [address] Phone: [phone]   Fax:  [phone]  Physical Therapy Treatment  Patient Details  Name: [name] MRN: [id number] Date of Birth: 1933-04-03 Referring Provider: Dr. Rodell Perna   Encounter Date: 07/22/2017      PT End of Session - 07/22/17 1116    Visit Number 5   Number of Visits 12   Date for PT Re-Evaluation 08/23/17   PT Start Time 1100   PT Stop Time 1200   PT Time Calculation (min) 60 min   Activity Tolerance Patient tolerated treatment well   Behavior During Therapy [facility] for tasks assessed/performed      Past Medical History:  Diagnosis Date  . Arthritis    left hip and back  . Bronchiectasis    isolated to RML; status post right middle lobe partial resection.  . Complication of anesthesia    hard to wake up  . Dyspnea   . Gall stones   . GERD (gastroesophageal reflux disease)    history   . H/O osteoporosis   . Hypertension   . Hypothyroidism   . Yeast infection     Past Surgical History:  Procedure Laterality Date  . ABDOMINAL HYSTERECTOMY  1974  . CHOLECYSTECTOMY N/A 07/02/2016   Procedure: LAPAROSCOPIC CHOLECYSTECTOMY;  Surgeon: Stark Klein, MD;  Location: [place];  Service: General;  Laterality: N/A;  . COLONOSCOPY    . EYE SURGERY     stye removed  . HERNIA REPAIR  1975  . KNEE SURGERY Left   . LUNG REMOVAL, PARTIAL  208   RML  . NM MYOVIEW LTD  06/2011   Normal EF. No ischemia or infarction.  Marland Kitchen TOTAL HIP ARTHROPLASTY Left 04/05/2017   Procedure: LEFT TOTAL HIP ARTHROPLASTY ANTERIOR APPROACH;  Surgeon: [name], MD;  Location: [place];  Service: Orthopedics;  Laterality: Left;  . TRANSTHORACIC ECHOCARDIOGRAM  06/2011   Mild concentric LVH. Normal EF with impaired relaxation. Mildly elevated RV pressures of 30 and 40 mmHg.  Marland Kitchen TRANSTHORACIC ECHOCARDIOGRAM  06/2016   normal LV size and function. EF 60-65% with GRD 2 DD. Mild aortic  stenosis. Mild LA dilation. Mild to moderately increased PA pressures (46 mmHg).    There were no vitals filed for this visit.      Subjective Assessment - 07/22/17 1112    Subjective Was out of town this weekend.  I did my exercises.  My Rt. knee is hurting a little today, hip 5/10. Rt. 3/10.    Currently in Pain? Yes   Pain Score 5    Pain Location Hip   Pain Orientation Right   Pain Descriptors / Indicators Sore   Pain Type Chronic pain   Pain Onset More than a month ago   Pain Frequency Intermittent   Aggravating Factors  overactivity    Pain Relieving Factors heat, stretching             [facility] Adult PT Treatment/Exercise - 07/22/17 0001      Lumbar Exercises: Stretches   Lower Trunk Rotation 10 seconds   Lower Trunk Rotation Limitations knees apart for hip ER/IR      Knee/Hip Exercises: Stretches   Hip Flexor Stretch Right;2 reps;60 seconds   Hip Flexor Stretch Limitations off table      Knee/Hip Exercises: Aerobic   Nustep UE and LE for 6 min level 5      Knee/Hip Exercises: Supine   Quad Sets  Strengthening;Both;1 set;15 reps   Short Arc [employer] Strengthening;Both;1 set;15 reps   Hip Adduction Isometric Strengthening;Both;1 set   Bridges Strengthening;Both;2 sets;10 reps   Bridges Limitations used ball squeeze   Straight Leg Raises Strengthening   Other Supine Knee/Hip Exercises Glute set x 10 , 5 sec      Knee/Hip Exercises: Sidelying   Hip ABduction --     Moist Heat Therapy   Number Minutes Moist Heat 10 Minutes   Moist Heat Location Hip     Manual Therapy   Manual Therapy Manual Traction   Manual Traction long axis distraction to R LE        P[place]ates Tower for LE/Core strength, postural strength, lumbopelvic disassociation and core control.  Exercises included: Supine Leg [place] single leg Yellow Arcs with Rt. Knee flexed x 10,   Manual hip mobilization with strap at Rt. Thigh for psoas release  Single leg arcs x 10, SLR, cues to breathe  and use core/abdominals   Rt. Leg elongation over side of high mat table, 60 sec with step to support foot.        PT Education - 07/22/17 1116    Education provided Yes   Education Details HEP, hip routine, assisted SLR with belt   Person(s) Educated Patient   Methods Explanation   Comprehension Verbalized understanding             PT Long Term Goals - 07/22/17 1119      PT LONG TERM GOAL #1   Title Pt will be I with HEP for hip and core   Status On-going     PT LONG TERM GOAL #2   Title Pt will be able to sit for church activities >1 hour without increased pain.    Baseline i know my limits and i stand when i need to    Status Partially Met     PT LONG TERM GOAL #3   Title Pt will be able to demo hip abduction strength 4/5 for improved gait stabilty.    Status Unable to assess     PT LONG TERM GOAL #4   Title Pt will be able to go up and down stairs at her home with confidence and no increase in hip pain as needed.    Baseline goes slow, uses rail and cane   Status Partially Met     PT LONG TERM GOAL #5   Title Pt will be able to do SLR on LLE with ease and no increase in hip pain.    Status On-going               Plan - 07/22/17 1154    Clinical Impression Statement Patient doing well, working on lengthening Rt. LE with ther ex, using posterior pelvic tilt to assist in engaging abdominals and doing SLR.  Goals in progress, can now sit as long as she needs to and has more confidence in her ability to do stairs.     PT Next Visit Plan : standing hip strength, SLR, modalities and manual for pain and ROM RT hip , long axis pulls RT hip.    PT Home Exercise Plan HHPT: QS, glute sets, abduction sidelying, bridging, LTR , Rt. ant hip stretching off the bed.    Consulted and Agree with Plan of Care Patient      Patient will benefit from skilled therapeutic intervention in order to improve the following deficits and impairments:  Abnormal gait, Increased  fascial restricitons,  Pain, Postural dysfunction, Decreased mobility, Decreased activity tolerance, Decreased range of motion, Decreased strength, Difficulty walking, Decreased balance, Obesity  Visit Diagnosis: Pain in left hip  Stiffness of hip joint, left  Difficulty in walking, not elsewhere classified     Problem List Patient Active Problem List   Diagnosis Date Noted  . Presence of left artificial hip joint 05/14/2017  . Preop cardiovascular exam 03/12/2017  . Cough   . Abdominal pain   . Symptomatic cholelithiasis 07/01/2016  . Nonspecific ST-T wave electrocardiographic changes   . Lower leg edema 02/16/2015  . Obesity (BMI 30.0-34.9) 02/16/2015  . Osteoporosis 01/11/2012  . Essential hypertension 01/11/2012  . Hyperthyroidism 01/11/2012  . Hypercholesteremia 01/11/2012  . H/O vaginal hysterectomy 1974 01/11/2012    Class: History of  . S/P removal of lung  partial R lobe  2010 01/11/2012  . History of hernia repair  R ing. 1975 01/11/2012  . BACK PAIN 11/12/2007  . DYSPNEA 11/11/2007    Adeeb Konecny 07/22/2017, 12:10 PM  [facility] [address] [place], [place], [address] Phone: [phone]   Fax:  [phone]  Name: [name] MRN: [id number] Date of Birth: [birth year]  Raeford Razor, PT 07/22/17 12:10 PM Phone: [phone] Fax: [phone]

## 2017-07-25 ENCOUNTER — Encounter: Payer: Self-pay | Admitting: Physical Therapy

## 2017-07-25 ENCOUNTER — Ambulatory Visit: Payer: Medicare Other | Admitting: Physical Therapy

## 2017-07-25 DIAGNOSIS — M25652 Stiffness of left hip, not elsewhere classified: Secondary | ICD-10-CM

## 2017-07-25 DIAGNOSIS — R262 Difficulty in walking, not elsewhere classified: Secondary | ICD-10-CM | POA: Diagnosis not present

## 2017-07-25 DIAGNOSIS — M25552 Pain in left hip: Secondary | ICD-10-CM | POA: Diagnosis not present

## 2017-07-25 NOTE — Therapy (Signed)
[place], [place], [address] Phone: [phone]   Fax:  [phone]  Physical Therapy Treatment  Patient Details  Name: [name] MRN: [id number] Date of Birth: [birth year] Referring Provider: Dr. Rodell Perna   Encounter Date: 07/25/2017      PT End of Session - 07/25/17 1028    Visit Number 6   Number of Visits 12   Date for PT Re-Evaluation 08/23/17   PT Start Time 1018   PT Stop Time 1113   PT Time Calculation (min) 55 min   Activity Tolerance Patient tolerated treatment well   Behavior During Therapy [facility] for tasks assessed/performed      Past Medical History:  Diagnosis Date  . Arthritis    left hip and back  . Bronchiectasis    isolated to RML; status post right middle lobe partial resection.  . Complication of anesthesia    hard to wake up  . Dyspnea   . Gall stones   . GERD (gastroesophageal reflux disease)    history   . H/O osteoporosis   . Hypertension   . Hypothyroidism   . Yeast infection     Past Surgical History:  Procedure Laterality Date  . ABDOMINAL HYSTERECTOMY  1974  . CHOLECYSTECTOMY N/A 07/02/2016   Procedure: LAPAROSCOPIC CHOLECYSTECTOMY;  Surgeon: Stark Klein, MD;  Location: [place];  Service: General;  Laterality: N/A;  . COLONOSCOPY    . EYE SURGERY     stye removed  . HERNIA REPAIR  1975  . KNEE SURGERY Left   . LUNG REMOVAL, PARTIAL  208   RML  . NM MYOVIEW LTD  06/2011   Normal EF. No ischemia or infarction.  Marland Kitchen TOTAL HIP ARTHROPLASTY Left 04/05/2017   Procedure: LEFT TOTAL HIP ARTHROPLASTY ANTERIOR APPROACH;  Surgeon: [name], MD;  Location: [place];  Service: Orthopedics;  Laterality: Left;  . TRANSTHORACIC ECHOCARDIOGRAM  06/2011   Mild concentric LVH. Normal EF with impaired relaxation. Mildly elevated RV pressures of 30 and 40 mmHg.  Marland Kitchen TRANSTHORACIC ECHOCARDIOGRAM  06/2016   normal LV size and function. EF 60-65% with GRD 2 DD. Mild aortic  stenosis. Mild LA dilation. Mild to moderately increased PA pressures (46 mmHg).    There were no vitals filed for this visit.      Subjective Assessment - 07/25/17 1021    Subjective Rt. hip is really giving me a fit.  Maybe I'm overdoing it.  Not hurting at rest.     Currently in Pain? Yes   Pain Score 4    Pain Location Hip   Pain Orientation Right   Pain Descriptors / Indicators Sharp   Pain Type Chronic pain   Pain Onset More than a month ago   Pain Frequency Intermittent   Aggravating Factors  weightbearing , overdoing it    Pain Relieving Factors heat, stretching                 [facility] Adult PT Treatment/Exercise - 07/25/17 0001      Knee/Hip Exercises: Stretches   Hip Flexor Stretch Right   Hip Flexor Stretch Limitations 5 min      Knee/Hip Exercises: Prone   Hamstring Curl 1 set;10 reps   Hip Extension Strengthening;Both;1 set;10 reps   Other Prone Exercises prone quad set x 10 each leg      Modalities   Modalities Moist Heat     Moist Heat Therapy   Number Minutes Moist  Heat 15 Minutes   Moist Heat Location Hip     Electrical Stimulation   Electrical Stimulation Location Rt. prox hip    Electrical Stimulation Action IFC   Electrical Stimulation Parameters to tol (25)    Electrical Stimulation Goals Pain     Manual Therapy   Manual Therapy Soft tissue mobilization;Passive ROM;Manual Traction   Soft tissue mobilization rectus femoris, lateral thigh    Passive ROM hip ER and IR, knee flexion for ant thigh and quad stretch    Manual Traction long axis distraction to R LE                 PT Education - 07/25/17 1027    Education provided Yes   Education Details stretching    Person(s) Educated Patient   Methods Explanation   Comprehension Verbalized understanding             PT Long Term Goals - 07/22/17 1119      PT LONG TERM GOAL #1   Title Pt will be I with HEP for hip and core   Status On-going     PT LONG TERM GOAL #2    Title Pt will be able to sit for church activities >1 hour without increased pain.    Baseline i know my limits and i stand when i need to    Status Partially Met     PT LONG TERM GOAL #3   Title Pt will be able to demo hip abduction strength 4/5 for improved gait stabilty.    Status Unable to assess     PT LONG TERM GOAL #4   Title Pt will be able to go up and down stairs at her home with confidence and no increase in hip pain as needed.    Baseline goes slow, uses rail and cane   Status Partially Met     PT LONG TERM GOAL #5   Title Pt will be able to do SLR on LLE with ease and no increase in hip pain.    Status On-going               Plan - 07/25/17 1057    Clinical Impression Statement Pt wiht increased pain today. She is accepting that she may need to have her Rt. hip operated on after all.  Trial of IFC to see if that helps her pain.     PT Next Visit Plan : standing hip strength, SLR, modalities and manual for pain and ROM RT hip , long axis pulls RT hip. how was IFC    PT Home Exercise Plan HHPT: QS, glute sets, abduction sidelying, bridging, LTR , Rt. ant hip stretching off the bed.    Consulted and Agree with Plan of Care Patient      Patient will benefit from skilled therapeutic intervention in order to improve the following deficits and impairments:  Abnormal gait, Increased fascial restricitons, Pain, Postural dysfunction, Decreased mobility, Decreased activity tolerance, Decreased range of motion, Decreased strength, Difficulty walking, Decreased balance, Obesity  Visit Diagnosis: Pain in left hip  Stiffness of hip joint, left  Difficulty in walking, not elsewhere classified     Problem List Patient Active Problem List   Diagnosis Date Noted  . Presence of left artificial hip joint 05/14/2017  . Preop cardiovascular exam 03/12/2017  . Cough   . Abdominal pain   . Symptomatic cholelithiasis 07/01/2016  . Nonspecific ST-T wave electrocardiographic  changes   . Lower leg edema  02/16/2015  . Obesity (BMI 30.0-34.9) 02/16/2015  . Osteoporosis 01/11/2012  . Essential hypertension 01/11/2012  . Hyperthyroidism 01/11/2012  . Hypercholesteremia 01/11/2012  . H/O vaginal hysterectomy 1974 01/11/2012    Class: History of  . S/P removal of lung  partial R lobe  2010 01/11/2012  . History of hernia repair  R ing. 1975 01/11/2012  . BACK PAIN 11/12/2007  . DYSPNEA 11/11/2007    Angalina Ante 07/25/2017, 10:59 AM  [place], [place], [address] Phone: [phone]   Fax:  [phone]  Name: [name] MRN: [id number] Date of Birth: 1932/10/16  Raeford Razor, PT 07/25/17 10:59 AM Phone: [phone] Fax: [phone]

## 2017-07-29 ENCOUNTER — Ambulatory Visit: Payer: Medicare Other | Admitting: Physical Therapy

## 2017-07-29 DIAGNOSIS — M25552 Pain in left hip: Secondary | ICD-10-CM

## 2017-07-29 DIAGNOSIS — M25652 Stiffness of left hip, not elsewhere classified: Secondary | ICD-10-CM | POA: Diagnosis not present

## 2017-07-29 DIAGNOSIS — R262 Difficulty in walking, not elsewhere classified: Secondary | ICD-10-CM | POA: Diagnosis not present

## 2017-07-29 NOTE — Therapy (Signed)
[place], [place], [address] Phone: [phone]   Fax:  [phone]  Physical Therapy Treatment  Patient Details  Name: [name] MRN: [id number] Date of Birth: 01/06/1933 Referring Provider: Dr. Rodell Perna   Encounter Date: 07/29/2017      PT End of Session - 07/29/17 1139    Visit Number 7   Number of Visits 12   Date for PT Re-Evaluation 08/23/17   PT Start Time 1105   PT Stop Time 1156   PT Time Calculation (min) 51 min   Activity Tolerance Patient tolerated treatment well   Behavior During Therapy [facility] for tasks assessed/performed      Past Medical History:  Diagnosis Date  . Arthritis    left hip and back  . Bronchiectasis    isolated to RML; status post right middle lobe partial resection.  . Complication of anesthesia    hard to wake up  . Dyspnea   . Gall stones   . GERD (gastroesophageal reflux disease)    history   . H/O osteoporosis   . Hypertension   . Hypothyroidism   . Yeast infection     Past Surgical History:  Procedure Laterality Date  . ABDOMINAL HYSTERECTOMY  1974  . CHOLECYSTECTOMY N/A 07/02/2016   Procedure: LAPAROSCOPIC CHOLECYSTECTOMY;  Surgeon: Stark Klein, MD;  Location: [place];  Service: General;  Laterality: N/A;  . COLONOSCOPY    . EYE SURGERY     stye removed  . HERNIA REPAIR  1975  . KNEE SURGERY Left   . LUNG REMOVAL, PARTIAL  208   RML  . NM MYOVIEW LTD  06/2011   Normal EF. No ischemia or infarction.  Marland Kitchen TOTAL HIP ARTHROPLASTY Left 04/05/2017   Procedure: LEFT TOTAL HIP ARTHROPLASTY ANTERIOR APPROACH;  Surgeon: [name], MD;  Location: [place];  Service: Orthopedics;  Laterality: Left;  . TRANSTHORACIC ECHOCARDIOGRAM  06/2011   Mild concentric LVH. Normal EF with impaired relaxation. Mildly elevated RV pressures of 30 and 40 mmHg.  Marland Kitchen TRANSTHORACIC ECHOCARDIOGRAM  06/2016   normal LV size and function. EF 60-65% with GRD 2 DD. Mild aortic  stenosis. Mild LA dilation. Mild to moderately increased PA pressures (46 mmHg).    There were no vitals filed for this visit.      Subjective Assessment - 07/29/17 1106    Subjective Both hips 3/10.  I dont want to complain.    Currently in Pain? Yes   Pain Score 3            [facility] Adult PT Treatment/Exercise - 07/29/17 0001      Knee/Hip Exercises: Stretches   Hip Flexor Stretch Both;3 reps;30 seconds   Hip Flexor Stretch Limitations used chair , pain and pulling      Knee/Hip Exercises: Aerobic   Nustep UE and LE for 6 min level 5      Knee/Hip Exercises: Standing   Lateral Step Up Both;1 set;15 reps   Lateral Step Up Limitations hip strength 6 inch    Functional Squat 1 set;10 reps   Functional Squat Limitations blue band around thighs      Knee/Hip Exercises: Seated   Clamshell with TheraBand Blue  x 20    Sit to Sand 10 reps;with UE support  focus on hip extension good control      Knee/Hip Exercises: Supine   Bridges Strengthening;Both;1 set;10 reps   Other Supine Knee/Hip Exercises clam x 15 with blue  band      Moist Heat Therapy   Number Minutes Moist Heat 10 Minutes   Moist Heat Location Hip     Manual Therapy   Manual Traction long axis distraction to R LE                 PT Education - 07/29/17 1139    Education provided Yes   Education Details help with leg length discepancy    Person(s) Educated Patient   Methods Explanation   Comprehension Verbalized understanding             PT Long Term Goals - 07/22/17 1119      PT LONG TERM GOAL #1   Title Pt will be I with HEP for hip and core   Status On-going     PT LONG TERM GOAL #2   Title Pt will be able to sit for church activities >1 hour without increased pain.    Baseline i know my limits and i stand when i need to    Status Partially Met     PT LONG TERM GOAL #3   Title Pt will be able to demo hip abduction strength 4/5 for improved gait stabilty.    Status Unable to  assess     PT LONG TERM GOAL #4   Title Pt will be able to go up and down stairs at her home with confidence and no increase in hip pain as needed.    Baseline goes slow, uses rail and cane   Status Partially Met     PT LONG TERM GOAL #5   Title Pt will be able to do SLR on LLE with ease and no increase in hip pain.    Status On-going               Plan - 07/29/17 1148    Clinical Impression Statement Patient continues to be limited in mobility by pain in R> L hip.  She had difficulty and pain with lateral step ups due to hip weakness.  Needed UE support help pull herself up, modified step hgt.  She has not reported any progress subjectively.  May come until she sees MD as she is quite functional at home.  She is concerned, though about her gait pattern and aggravation of back pain.    PT Next Visit Plan : standing hip strength, SLR standing , modalities and manual for pain and ROM RT hip , long axis pulls RT hip. FOTO    PT Home Exercise Plan HHPT: QS, glute sets, abduction sidelying, bridging, LTR , Rt. ant hip stretching off the bed.    Consulted and Agree with Plan of Care Patient      Patient will benefit from skilled therapeutic intervention in order to improve the following deficits and impairments:  Abnormal gait, Increased fascial restricitons, Pain, Postural dysfunction, Decreased mobility, Decreased activity tolerance, Decreased range of motion, Decreased strength, Difficulty walking, Decreased balance, Obesity  Visit Diagnosis: Pain in left hip  Stiffness of hip joint, left  Difficulty in walking, not elsewhere classified     Problem List Patient Active Problem List   Diagnosis Date Noted  . Presence of left artificial hip joint 05/14/2017  . Preop cardiovascular exam 03/12/2017  . Cough   . Abdominal pain   . Symptomatic cholelithiasis 07/01/2016  . Nonspecific ST-T wave electrocardiographic changes   . Lower leg edema 02/16/2015  . Obesity (BMI  30.0-34.9) 02/16/2015  . Osteoporosis 01/11/2012  .  Essential hypertension 01/11/2012  . Hyperthyroidism 01/11/2012  . Hypercholesteremia 01/11/2012  . H/O vaginal hysterectomy 1974 01/11/2012    Class: History of  . S/P removal of lung  partial R lobe  2010 01/11/2012  . History of hernia repair  R ing. 1975 01/11/2012  . BACK PAIN 11/12/2007  . DYSPNEA 11/11/2007    PAA,JENNIFER 07/29/2017, 11:51 AM  [facility] [address] [place], [place], [address] Phone: [phone]   Fax:  [phone]  Name: Shebra M Giese MRN: [id number] Date of Birth: 07/05/1933  Jennifer Paa, PT 07/29/17 11:52 AM Phone: [phone] Fax: [phone]  

## 2017-07-31 ENCOUNTER — Other Ambulatory Visit (INDEPENDENT_AMBULATORY_CARE_PROVIDER_SITE_OTHER): Payer: Self-pay | Admitting: Orthopaedic Surgery

## 2017-07-31 NOTE — Telephone Encounter (Signed)
Ok for refill? 

## 2017-08-01 ENCOUNTER — Ambulatory Visit: Payer: Medicare Other | Admitting: Physical Therapy

## 2017-08-01 ENCOUNTER — Encounter: Payer: Self-pay | Admitting: Physical Therapy

## 2017-08-01 DIAGNOSIS — M25552 Pain in left hip: Secondary | ICD-10-CM | POA: Diagnosis not present

## 2017-08-01 DIAGNOSIS — R262 Difficulty in walking, not elsewhere classified: Secondary | ICD-10-CM | POA: Diagnosis not present

## 2017-08-01 DIAGNOSIS — M25652 Stiffness of left hip, not elsewhere classified: Secondary | ICD-10-CM

## 2017-08-01 NOTE — Therapy (Signed)
[place], [place], [address] Phone: [phone]   Fax:  [phone]  Physical Therapy Treatment  Patient Details  Name: [name] MRN: [id number] Date of Birth: 02-20-1933 Referring Provider: Dr. Rodell Perna   Encounter Date: 08/01/2017      PT End of Session - 08/01/17 1058    Visit Number 8   Number of Visits 12   Date for PT Re-Evaluation 08/23/17   PT Start Time 1017   PT Stop Time 1112   PT Time Calculation (min) 55 min      Past Medical History:  Diagnosis Date  . Arthritis    left hip and back  . Bronchiectasis    isolated to RML; status post right middle lobe partial resection.  . Complication of anesthesia    hard to wake up  . Dyspnea   . Gall stones   . GERD (gastroesophageal reflux disease)    history   . H/O osteoporosis   . Hypertension   . Hypothyroidism   . Yeast infection     Past Surgical History:  Procedure Laterality Date  . ABDOMINAL HYSTERECTOMY  1974  . CHOLECYSTECTOMY N/A 07/02/2016   Procedure: LAPAROSCOPIC CHOLECYSTECTOMY;  Surgeon: Stark Klein, MD;  Location: [place];  Service: General;  Laterality: N/A;  . COLONOSCOPY    . EYE SURGERY     stye removed  . HERNIA REPAIR  1975  . KNEE SURGERY Left   . LUNG REMOVAL, PARTIAL  208   RML  . NM MYOVIEW LTD  06/2011   Normal EF. No ischemia or infarction.  Marland Kitchen TOTAL HIP ARTHROPLASTY Left 04/05/2017   Procedure: LEFT TOTAL HIP ARTHROPLASTY ANTERIOR APPROACH;  Surgeon: [name], MD;  Location: [place];  Service: Orthopedics;  Laterality: Left;  . TRANSTHORACIC ECHOCARDIOGRAM  06/2011   Mild concentric LVH. Normal EF with impaired relaxation. Mildly elevated RV pressures of 30 and 40 mmHg.  Marland Kitchen TRANSTHORACIC ECHOCARDIOGRAM  06/2016   normal LV size and function. EF 60-65% with GRD 2 DD. Mild aortic stenosis. Mild LA dilation. Mild to moderately increased PA pressures (46 mmHg).    There were no vitals filed for  this visit.      Subjective Assessment - 08/01/17 1028    Subjective Pain woke me this am at 5:00 am    Currently in Pain? No/denies            [facility] Adult PT Treatment/Exercise - 08/01/17 0001      Knee/Hip Exercises: Aerobic   Nustep UE and LE for 10 min level 5      Knee/Hip Exercises: Seated   Long Arc Quad Strengthening;Both;1 set;20 reps   Long Arc Quad Weight 4 lbs.   Sit to Sand 1 set;10 reps;with UE support  add heel raise 4 lbs      Knee/Hip Exercises: Supine   Quad Sets Strengthening;Both;1 set;15 reps   Bridges Strengthening;Both;1 set;10 reps   Straight Leg Raises Strengthening;Both;1 set;10 reps   Straight Leg Raises Limitations used strap to assist and did another set with knee bent    Other Supine Knee/Hip Exercises Glute set x 10      Knee/Hip Exercises: Sidelying   Hip ABduction Strengthening;Both;2 sets;10 reps                PT Education - 08/01/17 1058    Education provided Yes   Education Details [employer], POC and HEP for hip flexion  Person(s) Educated Patient   Methods Explanation;Handout   Comprehension Verbalized understanding;Returned demonstration             PT Long Term Goals - 07/22/17 1119      PT LONG TERM GOAL #1   Title Pt will be I with HEP for hip and core   Status On-going     PT LONG TERM GOAL #2   Title Pt will be able to sit for church activities >1 hour without increased pain.    Baseline i know my limits and i stand when i need to    Status Partially Met     PT LONG TERM GOAL #3   Title Pt will be able to demo hip abduction strength 4/5 for improved gait stabilty.    Status Unable to assess     PT LONG TERM GOAL #4   Title Pt will be able to go up and down stairs at her home with confidence and no increase in hip pain as needed.    Baseline goes slow, uses rail and cane   Status Partially Met     PT LONG TERM GOAL #5   Title Pt will be able to do SLR on LLE with ease and no increase  in hip pain.    Status On-going               Plan - 08/01/17 1047    Clinical Impression Statement Patient with less pain today. FOTO improved to 42% which is her predicted level.  Worked on hip flexor strengthening today, needed to modify with a bent knee for ease of pain and strain on Rt. ant hip.  She had questions regarding the [facility] and water aerobics.  WIll likely be done next visit.    PT Next Visit Plan Check HEP, DC? standing hip strength, SLR standing , modalities and manual for pain and ROM RT hip , long axis pulls RT hip.    PT Home Exercise Plan HHPT: QS, glute sets, abduction sidelying, bridging, LTR , Rt. ant hip stretching off the bed. supine march/SLR for hip flexor strength    Consulted and Agree with Plan of Care Patient      Patient will benefit from skilled therapeutic intervention in order to improve the following deficits and impairments:  Abnormal gait, Increased fascial restricitons, Pain, Postural dysfunction, Decreased mobility, Decreased activity tolerance, Decreased range of motion, Decreased strength, Difficulty walking, Decreased balance, Obesity  Visit Diagnosis: Pain in left hip  Stiffness of hip joint, left  Difficulty in walking, not elsewhere classified     Problem List Patient Active Problem List   Diagnosis Date Noted  . Presence of left artificial hip joint 05/14/2017  . Preop cardiovascular exam 03/12/2017  . Cough   . Abdominal pain   . Symptomatic cholelithiasis 07/01/2016  . Nonspecific ST-T wave electrocardiographic changes   . Lower leg edema 02/16/2015  . Obesity (BMI 30.0-34.9) 02/16/2015  . Osteoporosis 01/11/2012  . Essential hypertension 01/11/2012  . Hyperthyroidism 01/11/2012  . Hypercholesteremia 01/11/2012  . H/O vaginal hysterectomy 1974 01/11/2012    Class: History of  . S/P removal of lung  partial R lobe  2010 01/11/2012  . History of hernia repair  R ing. 1975 01/11/2012  . BACK PAIN  11/12/2007  . DYSPNEA 11/11/2007    Bennye Nix 08/01/2017, 1:07 PM  [facility] [address] [place], [place], [address] Phone: [phone]   Fax:  [phone]  Name:  JAID QUIRION MRN: [id number] Date of Birth: [birth year]  Raeford Razor, PT 08/01/17 1:07 PM Phone: [phone] Fax: [phone]

## 2017-08-05 ENCOUNTER — Ambulatory Visit: Payer: Medicare Other | Admitting: Physical Therapy

## 2017-08-05 DIAGNOSIS — M25552 Pain in left hip: Secondary | ICD-10-CM

## 2017-08-05 DIAGNOSIS — M25652 Stiffness of left hip, not elsewhere classified: Secondary | ICD-10-CM

## 2017-08-05 DIAGNOSIS — R262 Difficulty in walking, not elsewhere classified: Secondary | ICD-10-CM | POA: Diagnosis not present

## 2017-08-05 NOTE — Therapy (Signed)
[address] [place], [place], [address] Phone: [phone]   Fax:  [phone]  Physical Therapy Treatment  Patient Details  Name: [name] MRN: [id number] Date of Birth: Mar 10, 1933 Referring Provider: Dr. Rodell Perna   Encounter Date: 08/05/2017      PT End of Session - 08/05/17 1034    Visit Number 9   Number of Visits 12   Date for PT Re-Evaluation 08/23/17   PT Start Time 1017   PT Stop Time 1111   PT Time Calculation (min) 53 min   Activity Tolerance Patient tolerated treatment well   Behavior During Therapy [facility] for tasks assessed/performed      Past Medical History:  Diagnosis Date  . Arthritis    left hip and back  . Bronchiectasis    isolated to RML; status post right middle lobe partial resection.  . Complication of anesthesia    hard to wake up  . Dyspnea   . Gall stones   . GERD (gastroesophageal reflux disease)    history   . H/O osteoporosis   . Hypertension   . Hypothyroidism   . Yeast infection     Past Surgical History:  Procedure Laterality Date  . ABDOMINAL HYSTERECTOMY  1974  . CHOLECYSTECTOMY N/A 07/02/2016   Procedure: LAPAROSCOPIC CHOLECYSTECTOMY;  Surgeon: Stark Klein, MD;  Location: [place];  Service: General;  Laterality: N/A;  . COLONOSCOPY    . EYE SURGERY     stye removed  . HERNIA REPAIR  1975  . KNEE SURGERY Left   . LUNG REMOVAL, PARTIAL  208   RML  . NM MYOVIEW LTD  06/2011   Normal EF. No ischemia or infarction.  Marland Kitchen TOTAL HIP ARTHROPLASTY Left 04/05/2017   Procedure: LEFT TOTAL HIP ARTHROPLASTY ANTERIOR APPROACH;  Surgeon: [name], MD;  Location: [place];  Service: Orthopedics;  Laterality: Left;  . TRANSTHORACIC ECHOCARDIOGRAM  06/2011   Mild concentric LVH. Normal EF with impaired relaxation. Mildly elevated RV pressures of 30 and 40 mmHg.  Marland Kitchen TRANSTHORACIC ECHOCARDIOGRAM  06/2016   normal LV size and function. EF 60-65% with GRD 2 DD. Mild aortic  stenosis. Mild LA dilation. Mild to moderately increased PA pressures (46 mmHg).    There were no vitals filed for this visit.      Subjective Assessment - 08/05/17 1026    Subjective Patients sees MD tomorrow.  He may give me another shot.    Currently in Pain? Yes   Pain Score 7    Pain Location Hip   Pain Orientation Right   Pain Descriptors / Indicators Aching;Sharp   Pain Type Chronic pain   Pain Onset More than a month ago   Pain Frequency Intermittent   Aggravating Factors  activity, walking    Pain Relieving Factors heat, exercises              [facility] Adult PT Treatment/Exercise - 08/05/17 0001      Knee/Hip Exercises: Aerobic   Nustep UE and LE for 10 min level 5      Knee/Hip Exercises: Standing   Lateral Step Up Both;1 set;15 reps   Lateral Step Up Limitations hip strength 6 inch    Functional Squat 1 set;10 reps   SLS standing hip flex SLR each leg x 10 , decr hip stability on Rt.    Other Standing Knee Exercises step up and over 4 inch step with parallel bars for support  Knee/Hip Exercises: Supine   Short Arc Quad Sets Strengthening;Both;1 set   Short Arc [employer] Limitations in hooklying with ball squeeze   Bridges Strengthening;Both;1 set;10 reps   Other Supine Knee/Hip Exercises Glute set x 10      Moist Heat Therapy   Number Minutes Moist Heat 10 Minutes   Moist Heat Location Hip     Manual Therapy   Manual Traction long axis distraction to R LE                 PT Education - 08/05/17 1032    Education provided Yes   Education Details POC, standing hip ex    Person(s) Educated Patient   Methods Explanation   Comprehension Verbalized understanding             PT Long Term Goals - 08/05/17 1043      PT LONG TERM GOAL #1   Title Pt will be I with HEP for hip and core   Status On-going     PT LONG TERM GOAL #2   Title Pt will be able to sit for church activities >1 hour without increased pain.    Baseline sometimes,  when I crochet, I forget how long I'm sitting sometimes    Status Partially Met     PT LONG TERM GOAL #3   Title Pt will be able to demo hip abduction strength 4/5 for improved gait stabilty.      PT LONG TERM GOAL #4   Title Pt will be able to go up and down stairs at her home with confidence and no increase in hip pain as needed.    Status Achieved     PT LONG TERM GOAL #5   Title Pt will be able to do SLR on LLE with ease and no increase in hip pain.    Status On-going               Plan - 08/05/17 1102    Clinical Impression Statement Explained FOTO results.  Pt cont with severe pain in Rt. hip.. She cont with weakness and inability to maintain level stable pelvis on Rt. LE.  Pt may decide to finish POC at her next visit depending on what MD says.  She likely will not gain much more function due to hip joint OA.    PT Next Visit Plan Check HEP, DC? standing hip strength, SLR standing , modalities and manual for pain and ROM RT hip , long axis pulls RT hip.    PT Home Exercise Plan HHPT: QS, glute sets, abduction sidelying, bridging, LTR , Rt. ant hip stretching off the bed. supine march/SLR for hip flexor strength    Consulted and Agree with Plan of Care Patient      Patient will benefit from skilled therapeutic intervention in order to improve the following deficits and impairments:  Abnormal gait, Increased fascial restricitons, Pain, Postural dysfunction, Decreased mobility, Decreased activity tolerance, Decreased range of motion, Decreased strength, Difficulty walking, Decreased balance, Obesity  Visit Diagnosis: Pain in left hip  Stiffness of hip joint, left  Difficulty in walking, not elsewhere classified     Problem List Patient Active Problem List   Diagnosis Date Noted  . Presence of left artificial hip joint 05/14/2017  . Preop cardiovascular exam 03/12/2017  . Cough   . Abdominal pain   . Symptomatic cholelithiasis 07/01/2016  . Nonspecific ST-T wave  electrocardiographic changes   . Lower leg edema 02/16/2015  .  Obesity (BMI 30.0-34.9) 02/16/2015  . Osteoporosis 01/11/2012  . Essential hypertension 01/11/2012  . Hyperthyroidism 01/11/2012  . Hypercholesteremia 01/11/2012  . H/O vaginal hysterectomy 1974 01/11/2012    Class: History of  . S/P removal of lung  partial R lobe  2010 01/11/2012  . History of hernia repair  R ing. 1975 01/11/2012  . BACK PAIN 11/12/2007  . DYSPNEA 11/11/2007    [name] 08/05/2017, 1:13 PM  [facility] [address] [place], [place], [address] Phone: [phone]   Fax:  [phone]  Name: [name] MRN: [id number] Date of Birth: July 27, 1933  Raeford Razor, PT 08/05/17 1:14 PM Phone: [phone] Fax: [phone]

## 2017-08-06 ENCOUNTER — Ambulatory Visit (INDEPENDENT_AMBULATORY_CARE_PROVIDER_SITE_OTHER): Payer: Medicare Other | Admitting: Orthopaedic Surgery

## 2017-08-06 ENCOUNTER — Encounter (INDEPENDENT_AMBULATORY_CARE_PROVIDER_SITE_OTHER): Payer: Self-pay | Admitting: Orthopaedic Surgery

## 2017-08-06 VITALS — BP 124/69 | HR 54 | Ht 60.0 in | Wt 199.0 lb

## 2017-08-06 DIAGNOSIS — Z96642 Presence of left artificial hip joint: Secondary | ICD-10-CM

## 2017-08-06 DIAGNOSIS — M1611 Unilateral primary osteoarthritis, right hip: Secondary | ICD-10-CM

## 2017-08-06 NOTE — Progress Notes (Signed)
Office Visit Note   Patient: [name]           Date of Birth: 1932-10-25           MRN: [id number] Visit Date: 08/06/2017              Requested by: Glendale Chard, [address] [place], [place] [address] PCP: Glendale Chard, MD   Assessment & Plan: Visit Diagnoses:  1. Presence of left artificial hip joint   2. Unilateral primary osteoarthritis, right hip     Plan: She'll continue her cane continue walking therapy exercises elliptical aerobics to help with weight loss and I'll recheck her again in 3 months. Return visit will obtain a standing AP x-rays of both knees and the single standing AP x-ray  To see both hips.   Follow-Up Instructions: Return in about 3 months (around 11/06/2017).   Orders:  No orders of the defined types were placed in this encounter.  No orders of the defined types were placed in this encounter.     Procedures: No procedures performed   Clinical Data: No additional findings.   Subjective: Chief Complaint  Patient presents with  . Left Hip - Follow-up    HPI a 83-year-old female returns post left total hip arthroplasty. She is a half inch long on the operative side she has a buildup in her shoes and is doing well. Left total knee arthroplasty doing well. She has some mild osteoarthritis of her right knee. Right hip osteoarthritis is progressively giving her problems and she continues to use a cane.   Review of Systems posterior right lobe removal for pneumonia 2010 hernia repair. Obesity, hypothyroidism, increased cholesterol, previous gallbladder removal. Total knee arthroplasty left knee. Left total hip arthroplasty.   Objective: Vital Signs: BP 124/69   Pulse (!) 54   Ht 5' (1.524 m)   Wt 199 lb (90.3 kg)   BMI 38.86 kg/m   Physical Exam  Constitutional: She is oriented to person, place, and time. She appears well-developed.  HENT:  Head: Normocephalic.  Right Ear: External ear normal.  Left Ear: External  ear normal.  Eyes: Pupils are equal, round, and reactive to light.  Neck: No tracheal deviation present. No thyromegaly present.  Cardiovascular: Normal rate.   Pulmonary/Chest: Effort normal.  Abdominal: Soft.  Neurological: She is alert and oriented to person, place, and time.  Skin: Skin is warm and dry.  Psychiatric: She has a normal mood and affect. Her behavior is normal.    Ortho Exam healed left anterior hip incision left knee incision. Mild crepitus right knee range of motion. No significant knee effusion on the right. She has the groin pain with internal rotation of the right hip at 10:15 degrees reproduced with external rotation at 25. With her built-up shoe half and she is level standing. By previous x-rays she has 1/2 inch shortening on the right side.  Specialty Comments:  No specialty comments available.  Imaging: No results found.   PMFS History: Patient Active Problem List   Diagnosis Date Noted  . Unilateral primary osteoarthritis, right hip 08/06/2017  . Presence of left artificial hip joint 05/14/2017  . Preop cardiovascular exam 03/12/2017  . Cough   . Abdominal pain   . Symptomatic cholelithiasis 07/01/2016  . Nonspecific ST-T wave electrocardiographic changes   . Lower leg edema 02/16/2015  . Obesity (BMI 30.0-34.9) 02/16/2015  . Osteoporosis 01/11/2012  . Essential hypertension 01/11/2012  . Hyperthyroidism 01/11/2012  .  Hypercholesteremia 01/11/2012  . H/O vaginal hysterectomy 1974 01/11/2012    Class: History of  . S/P removal of lung  partial R lobe  2010 01/11/2012  . History of hernia repair  R ing. 1975 01/11/2012  . BACK PAIN 11/12/2007  . DYSPNEA 11/11/2007   Past Medical History:  Diagnosis Date  . Arthritis    left hip and back  . Bronchiectasis    isolated to RML; status post right middle lobe partial resection.  . Complication of anesthesia    hard to wake up  . Dyspnea   . Gall stones   . GERD (gastroesophageal reflux disease)     history   . H/O osteoporosis   . Hypertension   . Hypothyroidism   . Yeast infection     Family History  Problem Relation Age of Onset  . Hypertension Mother   . CVA Mother 1  . Heart attack Sister   . Diabetes Sister   . Heart attack Brother   . Heart attack Brother     Past Surgical History:  Procedure Laterality Date  . ABDOMINAL HYSTERECTOMY  1974  . CHOLECYSTECTOMY N/A 07/02/2016   Procedure: LAPAROSCOPIC CHOLECYSTECTOMY;  Surgeon: Stark Klein, MD;  Location: [facility];  Service: General;  Laterality: N/A;  . COLONOSCOPY    . EYE SURGERY     stye removed  . HERNIA REPAIR  1975  . KNEE SURGERY Left   . LUNG REMOVAL, PARTIAL  208   RML  . NM MYOVIEW LTD  06/2011   Normal EF. No ischemia or infarction.  Marland Kitchen TOTAL HIP ARTHROPLASTY Left 04/05/2017   Procedure: LEFT TOTAL HIP ARTHROPLASTY ANTERIOR APPROACH;  Surgeon: [name], MD;  Location: [facility];  Service: Orthopedics;  Laterality: Left;  . TRANSTHORACIC ECHOCARDIOGRAM  06/2011   Mild concentric LVH. Normal EF with impaired relaxation. Mildly elevated RV pressures of 30 and 40 mmHg.  Marland Kitchen TRANSTHORACIC ECHOCARDIOGRAM  06/2016   normal LV size and function. EF 60-65% with GRD 2 DD. Mild aortic stenosis. Mild LA dilation. Mild to moderately increased PA pressures (46 mmHg).   Social History   Occupational History  . Not on file.   Social History Main Topics  . Smoking status: Never Smoker  . Smokeless tobacco: Never Used  . Alcohol use No  . Drug use: No  . Sexual activity: No

## 2017-08-08 ENCOUNTER — Encounter: Payer: Self-pay | Admitting: Physical Therapy

## 2017-08-08 ENCOUNTER — Ambulatory Visit: Payer: Medicare Other | Attending: Internal Medicine | Admitting: Physical Therapy

## 2017-08-08 DIAGNOSIS — R262 Difficulty in walking, not elsewhere classified: Secondary | ICD-10-CM | POA: Diagnosis not present

## 2017-08-08 DIAGNOSIS — M25552 Pain in left hip: Secondary | ICD-10-CM

## 2017-08-08 DIAGNOSIS — M25652 Stiffness of left hip, not elsewhere classified: Secondary | ICD-10-CM

## 2017-08-08 NOTE — Therapy (Signed)
[place], [place], [address] Phone: [phone]   Fax:  [phone]  Physical Therapy Treatment and Discharge   Patient Details  Name: [name] MRN: [id number] Date of Birth: [birth year] Referring Provider: Dr. Rodell Perna   Encounter Date: 08/08/2017      PT End of Session - 08/08/17 1104    Visit Number 10   Number of Visits 12   Date for PT Re-Evaluation 08/23/17   PT Start Time 8453   PT Stop Time 1108   PT Time Calculation (min) 53 min   Activity Tolerance Patient tolerated treatment well   Behavior During Therapy [facility] for tasks assessed/performed      Past Medical History:  Diagnosis Date  . Arthritis    left hip and back  . Bronchiectasis    isolated to RML; status post right middle lobe partial resection.  . Complication of anesthesia    hard to wake up  . Dyspnea   . Gall stones   . GERD (gastroesophageal reflux disease)    history   . H/O osteoporosis   . Hypertension   . Hypothyroidism   . Yeast infection     Past Surgical History:  Procedure Laterality Date  . ABDOMINAL HYSTERECTOMY  1974  . CHOLECYSTECTOMY N/A 07/02/2016   Procedure: LAPAROSCOPIC CHOLECYSTECTOMY;  Surgeon: Stark Klein, MD;  Location: [place];  Service: General;  Laterality: N/A;  . COLONOSCOPY    . EYE SURGERY     stye removed  . HERNIA REPAIR  1975  . KNEE SURGERY Left   . LUNG REMOVAL, PARTIAL  208   RML  . NM MYOVIEW LTD  06/2011   Normal EF. No ischemia or infarction.  Marland Kitchen TOTAL HIP ARTHROPLASTY Left 04/05/2017   Procedure: LEFT TOTAL HIP ARTHROPLASTY ANTERIOR APPROACH;  Surgeon: [name], MD;  Location: [place];  Service: Orthopedics;  Laterality: Left;  . TRANSTHORACIC ECHOCARDIOGRAM  06/2011   Mild concentric LVH. Normal EF with impaired relaxation. Mildly elevated RV pressures of 30 and 40 mmHg.  Marland Kitchen TRANSTHORACIC ECHOCARDIOGRAM  06/2016   normal LV size and function. EF 60-65% with GRD 2 DD.  Mild aortic stenosis. Mild LA dilation. Mild to moderately increased PA pressures (46 mmHg).    There were no vitals filed for this visit.      Subjective Assessment - 08/08/17 1018    Subjective This will be my last visit.  I just have to wait and see if I'm going to have this Rt. one done.  I think last visit nearly killed.me, the step ups?    Currently in Pain? Yes   Pain Score 6    Pain Location Hip  and knee    Pain Orientation Right   Pain Descriptors / Indicators Aching   Pain Type Chronic pain   Pain Onset More than a month ago   Pain Frequency Intermittent   Aggravating Factors  activity, walking    Pain Relieving Factors heat, exericises,leg pull , Tylenol             [facility] PT Assessment - 08/08/17 0001      Strength   Right Hip ABduction 4-/5   Left Hip ABduction 3+/5                     [facility] Adult PT Treatment/Exercise - 08/08/17 0001      Lumbar Exercises: Stretches   Pelvic Tilt 10 seconds   Pelvic  Tilt Limitations x 10      Knee/Hip Exercises: Seated   Long Arc Quad Strengthening;Both;1 set;20 reps   Ball Squeeze x 10    Clamshell with TheraBand Blue  x 20    Sit to [employer] 1 set;10 reps;without UE support  withblue band      Knee/Hip Exercises: Sidelying   Hip ABduction Strengthening;Both;1 set;10 reps     Moist Heat Therapy   Number Minutes Moist Heat 10 Minutes   Moist Heat Location Hip     Manual Therapy   Passive ROM hamstring stretch and IR, ER   Manual Traction long axis distraction to R LE                 PT Education - 08/24/17 1143    Education provided Yes   Education Details HEP review and DC   Person(s) Educated Patient   Methods Explanation   Comprehension Verbalized understanding;Returned demonstration             PT Long Term Goals - 08/24/17 1143      PT LONG TERM GOAL #1   Title Pt will be I with HEP for hip and core   Status Achieved     PT LONG TERM GOAL #2   Title Pt will be able to  sit for church activities >1 hour without increased pain.    Status Partially Met     PT LONG TERM GOAL #3   Title Pt will be able to demo hip abduction strength 4/5 for improved gait stabilty.    Baseline 3+/5 to 4-/5   Status Partially Met     PT LONG TERM GOAL #4   Title Pt will be able to go up and down stairs at her home with confidence and no increase in hip pain as needed.    Baseline goes slow, uses rail and cane   Status Partially Met     PT LONG TERM GOAL #5   Title Pt will be able to do SLR on LLE with ease and no increase in hip pain.    Status Not Met               Plan - 08/24/17 1144    Clinical Impression Statement Patient has not improved per FOTO.  She has improved her Rt. hip strength but minimally.  She is comfortable around her home, is safe with stairs and has family support to assist with laundry which is downstairs.  Her L hip pain is minimal.  She was encouraged today to continue HEP, [facility] for continued strengthening. DC for now.    PT Next Visit Plan NA, DC    PT Home Exercise Plan HHPT: QS, glute sets, abduction sidelying, bridging, LTR , Rt. ant hip stretching off the bed. supine march/SLR for hip flexor strength    Consulted and Agree with Plan of Care Patient      Patient will benefit from skilled therapeutic intervention in order to improve the following deficits and impairments:     Visit Diagnosis: Pain in left hip  Stiffness of hip joint, left  Difficulty in walking, not elsewhere classified       G-Codes - August 24, 2017 1146    Functional Assessment Tool Used (Outpatient Only) FOTO/clinical judgement    Functional Limitation Mobility: Walking and moving around   Mobility: Walking and Moving Around Current Status ([id number]) At least 60 percent but less than 80 percent impaired, limited or restricted   Mobility: Walking and  Moving Around Goal Status [phone]) At least 40 percent but less than 60 percent impaired, limited or restricted    Mobility: Walking and Moving Around Discharge Status [phone]) At least 60 percent but less than 80 percent impaired, limited or restricted      Problem List Patient Active Problem List   Diagnosis Date Noted  . Unilateral primary osteoarthritis, right hip 08/06/2017  . Presence of left artificial hip joint 05/14/2017  . Preop cardiovascular exam 03/12/2017  . Cough   . Abdominal pain   . Symptomatic cholelithiasis 07/01/2016  . Nonspecific ST-T wave electrocardiographic changes   . Lower leg edema 02/16/2015  . Obesity (BMI 30.0-34.9) 02/16/2015  . Osteoporosis 01/11/2012  . Essential hypertension 01/11/2012  . Hyperthyroidism 01/11/2012  . Hypercholesteremia 01/11/2012  . H/O vaginal hysterectomy 1974 01/11/2012    Class: History of  . S/P removal of lung  partial R lobe  2010 01/11/2012  . History of hernia repair  R ing. 1975 01/11/2012  . BACK PAIN 11/12/2007  . DYSPNEA 11/11/2007    Jaquelin Meaney 08/08/2017, 11:47 AM  [facility] [address] [place], [place], [address] Phone: [phone]   Fax:  [phone]  Name: PEGAH SEGEL MRN: [id number] Date of Birth: [birth year]  PHYSICAL THERAPY DISCHARGE SUMMARY  Visits from Start of Care: 10  Current functional level related to goals / functional outcomes: See above , limited by Rt. Hip pain    Remaining deficits: Bilateral hip strength, tight hip flexors, weak glutes, knee stiffness, mild back pain, decr endurance.    Education / Equipment: Gait, DME, safety, HEP Plan: Patient agrees to discharge.  Patient goals were partially met. Patient is being discharged due to being pleased with the current functional level.  ?????    Plateau of progress  Raeford Razor, PT 08/08/17 1:19 PM Phone: [phone] Fax: [phone]

## 2017-08-20 DIAGNOSIS — Z1231 Encounter for screening mammogram for malignant neoplasm of breast: Secondary | ICD-10-CM | POA: Diagnosis not present

## 2017-08-20 DIAGNOSIS — M810 Age-related osteoporosis without current pathological fracture: Secondary | ICD-10-CM | POA: Diagnosis not present

## 2017-08-20 DIAGNOSIS — M8588 Other specified disorders of bone density and structure, other site: Secondary | ICD-10-CM | POA: Diagnosis not present

## 2017-11-05 ENCOUNTER — Ambulatory Visit (INDEPENDENT_AMBULATORY_CARE_PROVIDER_SITE_OTHER): Payer: Medicare Other | Admitting: Orthopaedic Surgery

## 2017-11-05 ENCOUNTER — Ambulatory Visit (INDEPENDENT_AMBULATORY_CARE_PROVIDER_SITE_OTHER): Payer: Medicare Other

## 2017-11-05 ENCOUNTER — Encounter (INDEPENDENT_AMBULATORY_CARE_PROVIDER_SITE_OTHER): Payer: Self-pay | Admitting: Orthopaedic Surgery

## 2017-11-05 VITALS — BP 134/79 | HR 54 | Ht 60.0 in | Wt 200.0 lb

## 2017-11-05 DIAGNOSIS — M25562 Pain in left knee: Secondary | ICD-10-CM | POA: Diagnosis not present

## 2017-11-05 DIAGNOSIS — Z96642 Presence of left artificial hip joint: Secondary | ICD-10-CM | POA: Diagnosis not present

## 2017-11-05 DIAGNOSIS — M1711 Unilateral primary osteoarthritis, right knee: Secondary | ICD-10-CM

## 2017-11-05 DIAGNOSIS — G8929 Other chronic pain: Secondary | ICD-10-CM | POA: Diagnosis not present

## 2017-11-05 DIAGNOSIS — M1611 Unilateral primary osteoarthritis, right hip: Secondary | ICD-10-CM | POA: Diagnosis not present

## 2017-11-05 DIAGNOSIS — M25561 Pain in right knee: Secondary | ICD-10-CM

## 2017-11-05 DIAGNOSIS — M1732 Unilateral post-traumatic osteoarthritis, left knee: Secondary | ICD-10-CM

## 2017-11-05 NOTE — Progress Notes (Signed)
Office Visit Note   Patient: [name]           Date of Birth: 1933-01-15           MRN: [id number] Visit Date: 11/05/2017              Requested by: Glendale Chard, [address] [place], [place] [address] PCP: Glendale Chard, MD   Assessment & Plan: Visit Diagnoses:  1. Chronic pain of both knees   2. H/O total hip arthroplasty, left   3. Unilateral primary osteoarthritis, right hip   4. Post-traumatic osteoarthritis of left knee   5. Unilateral primary osteoarthritis, right knee     Plan: Patient has end-stage symptomatic right hip primary osteoarthritis. Left hip  Arthroplasty is doing well.  She is having trouble walking uses a cane has already taken anti-inflammatories  Without relief.  Changes in her hip and also has bilateral knee osteoarthritis.  We will proceed with right total hip arthroplasty.  Plan procedure discussed.  Had the left total hip arthroplasty in is familiar with postoperative protocol.  Questions  were elicited and answered she understands and requests we proceed with right total hip arthroplasty.   Follow-Up Instructions: pre-op for right total hip arthroplasty, anterior approach.   Orders:  Orders Placed This Encounter  Procedures  . XR Pelvis 1-2 Views  . XR Knee 1-2 Views Right   No orders of the defined types were placed in this encounter.     Procedures: No procedures performed   Clinical Data: No additional findings.   Subjective: Chief Complaint  Patient presents with  . Left Knee - Pain  . Right Knee - Pain  . Left Hip - Follow-up  . Right Hip - Pain    HPI patient returns and is doing well post left total hip arthroplasty 04/05/2017.  Lift underneath her right heel in her shoe osteoarthritis and is progressively getting shorter.  She has one half quality with the right side shorter.  He is also having problems with bilateral knee osteoarthritis and previous history of left tibial plateau fracture fixed with 2  Steinmann pins distant past.  She is had to use a cane due to right hip osteoarthritis.  Patient has been on anti-inflammatories in the past also taking hydrocodone 7.5 325 for the pain in the right hip.  No pain has progressed and she  she is having difficulty walking with pain at night pain with community ambulation.Voltaren  Gel over her hip used without relief.  Review of Systems positive for right partial lobe removal 2010 low back pain, obesity, hypertension process, previous right total hip arthroplasty cholesterol and history of hyperthyroidism.  Previous cholecystectomy.  Negative history of problems with anesthesia.   Objective: Vital Signs: BP 134/79   Pulse (!) 54   Ht 5' (1.524 m)   Wt 200 lb (90.7 kg)   BMI 39.06 kg/m   Physical Exam  Constitutional: She is oriented to person, place, and time. She appears well-developed.  HENT:  Head: Normocephalic.  Right Ear: External ear normal.  Left Ear: External ear normal.  Eyes: Pupils are equal, round, and reactive to light.  Neck: No tracheal deviation present. No thyromegaly present.  Cardiovascular: Normal rate.  Pulmonary/Chest: Effort normal.  Abdominal: Soft.  Neurological: She is alert and oriented to person, place, and time.  Skin: Skin is warm and dry.  Psychiatric: She has a normal mood and affect. Her behavior is normal.    Ortho  Exam intact distal pulses.  No pain with internal and external rotation left hip.  Right hip gives her pain with 15 degrees internal rotation and only 30 degrees rotation with sharp groin pain that radiates down just above her knee.  Good quad strength sensation her foot is intact anterior tib EHL is intact distal pulses are intact.  No hip flexion contracture.  Not short measured with blocks which shortness on the right side.  Healed left anterior total hip arthroplasty.  Specialty Comments:  No specialty comments available.  Imaging: No results found.   PMFS History: Patient Active  Problem List   Diagnosis Date Noted  . Unilateral primary osteoarthritis, right hip 08/06/2017  . Presence of left artificial hip joint 05/14/2017  . Preop cardiovascular exam 03/12/2017  . Cough   . Abdominal pain   . Symptomatic cholelithiasis 07/01/2016  . Nonspecific ST-T wave electrocardiographic changes   . Lower leg edema 02/16/2015  . Obesity (BMI 30.0-34.9) 02/16/2015  . Osteoporosis 01/11/2012  . Essential hypertension 01/11/2012  . Hyperthyroidism 01/11/2012  . Hypercholesteremia 01/11/2012  . H/O vaginal hysterectomy 1974 01/11/2012    Class: History of  . S/P removal of lung  partial R lobe  2010 01/11/2012  . History of hernia repair  R ing. 1975 01/11/2012  . BACK PAIN 11/12/2007  . DYSPNEA 11/11/2007   Past Medical History:  Diagnosis Date  . Arthritis    left hip and back  . Bronchiectasis    isolated to RML; status post right middle lobe partial resection.  . Complication of anesthesia    hard to wake up  . Dyspnea   . Gall stones   . GERD (gastroesophageal reflux disease)    history   . H/O osteoporosis   . Hypertension   . Hypothyroidism   . Yeast infection     Family History  Problem Relation Age of Onset  . Hypertension Mother   . CVA Mother 12  . Heart attack Sister   . Diabetes Sister   . Heart attack Brother   . Heart attack Brother     Past Surgical History:  Procedure Laterality Date  . ABDOMINAL HYSTERECTOMY  1974  . CHOLECYSTECTOMY N/A 07/02/2016   Procedure: LAPAROSCOPIC CHOLECYSTECTOMY;  Surgeon: Stark Klein, MD;  Location: [facility];  Service: General;  Laterality: N/A;  . COLONOSCOPY    . EYE SURGERY     stye removed  . HERNIA REPAIR  1975  . KNEE SURGERY Left   . LUNG REMOVAL, PARTIAL  208   RML  . NM MYOVIEW LTD  06/2011   Normal EF. No ischemia or infarction.  Marland Kitchen TOTAL HIP ARTHROPLASTY Left 04/05/2017   Procedure: LEFT TOTAL HIP ARTHROPLASTY ANTERIOR APPROACH;  Surgeon: [name], MD;  Location: [place];  Service:  Orthopedics;  Laterality: Left;  . TRANSTHORACIC ECHOCARDIOGRAM  06/2011   Mild concentric LVH. Normal EF with impaired relaxation. Mildly elevated RV pressures of 30 and 40 mmHg.  Marland Kitchen TRANSTHORACIC ECHOCARDIOGRAM  06/2016   normal LV size and function. EF 60-65% with GRD 2 DD. Mild aortic stenosis. Mild LA dilation. Mild to moderately increased PA pressures (46 mmHg).   Social History   Occupational History  . Not on file  Tobacco Use  . Smoking status: Never Smoker  . Smokeless tobacco: Never Used  Substance and Sexual Activity  . Alcohol use: No  . Drug use: No  . Sexual activity: No    Birth control/protection: Abstinence

## 2017-11-06 ENCOUNTER — Ambulatory Visit (INDEPENDENT_AMBULATORY_CARE_PROVIDER_SITE_OTHER): Payer: Medicare Other | Admitting: Orthopaedic Surgery

## 2017-11-07 ENCOUNTER — Encounter (INDEPENDENT_AMBULATORY_CARE_PROVIDER_SITE_OTHER): Payer: Self-pay | Admitting: Orthopaedic Surgery

## 2017-11-07 DIAGNOSIS — M1711 Unilateral primary osteoarthritis, right knee: Secondary | ICD-10-CM | POA: Insufficient documentation

## 2017-11-07 DIAGNOSIS — Z96642 Presence of left artificial hip joint: Secondary | ICD-10-CM | POA: Insufficient documentation

## 2017-11-07 DIAGNOSIS — M1732 Unilateral post-traumatic osteoarthritis, left knee: Secondary | ICD-10-CM | POA: Insufficient documentation

## 2017-11-14 DIAGNOSIS — E0590 Thyrotoxicosis, unspecified without thyrotoxic crisis or storm: Secondary | ICD-10-CM | POA: Diagnosis not present

## 2017-11-18 DIAGNOSIS — K219 Gastro-esophageal reflux disease without esophagitis: Secondary | ICD-10-CM | POA: Diagnosis not present

## 2017-11-18 DIAGNOSIS — R112 Nausea with vomiting, unspecified: Secondary | ICD-10-CM | POA: Diagnosis not present

## 2017-12-23 DIAGNOSIS — K219 Gastro-esophageal reflux disease without esophagitis: Secondary | ICD-10-CM | POA: Diagnosis not present

## 2017-12-23 DIAGNOSIS — R112 Nausea with vomiting, unspecified: Secondary | ICD-10-CM | POA: Diagnosis not present

## 2017-12-25 DIAGNOSIS — I129 Hypertensive chronic kidney disease with stage 1 through stage 4 chronic kidney disease, or unspecified chronic kidney disease: Secondary | ICD-10-CM | POA: Diagnosis not present

## 2017-12-25 DIAGNOSIS — K219 Gastro-esophageal reflux disease without esophagitis: Secondary | ICD-10-CM | POA: Diagnosis not present

## 2017-12-25 DIAGNOSIS — N182 Chronic kidney disease, stage 2 (mild): Secondary | ICD-10-CM | POA: Diagnosis not present

## 2017-12-25 DIAGNOSIS — R7309 Other abnormal glucose: Secondary | ICD-10-CM | POA: Diagnosis not present

## 2017-12-25 DIAGNOSIS — M1611 Unilateral primary osteoarthritis, right hip: Secondary | ICD-10-CM | POA: Diagnosis not present

## 2018-01-15 ENCOUNTER — Other Ambulatory Visit: Payer: Self-pay | Admitting: Gastroenterology

## 2018-01-15 DIAGNOSIS — R634 Abnormal weight loss: Secondary | ICD-10-CM | POA: Diagnosis not present

## 2018-01-15 DIAGNOSIS — K219 Gastro-esophageal reflux disease without esophagitis: Secondary | ICD-10-CM | POA: Diagnosis not present

## 2018-01-15 DIAGNOSIS — R112 Nausea with vomiting, unspecified: Secondary | ICD-10-CM | POA: Diagnosis not present

## 2018-01-15 DIAGNOSIS — R1013 Epigastric pain: Secondary | ICD-10-CM | POA: Diagnosis not present

## 2018-01-15 DIAGNOSIS — R1084 Generalized abdominal pain: Secondary | ICD-10-CM

## 2018-01-23 ENCOUNTER — Ambulatory Visit
Admission: RE | Admit: 2018-01-23 | Discharge: 2018-01-23 | Disposition: A | Discharge status: Home / Self Care | Payer: Medicare Other | Source: Ambulatory Visit | Attending: Gastroenterology | Admitting: Gastroenterology

## 2018-01-23 DIAGNOSIS — R112 Nausea with vomiting, unspecified: Secondary | ICD-10-CM

## 2018-01-23 DIAGNOSIS — R1013 Epigastric pain: Secondary | ICD-10-CM | POA: Diagnosis not present

## 2018-01-23 DIAGNOSIS — R1084 Generalized abdominal pain: Secondary | ICD-10-CM

## 2018-01-23 MED ORDER — IOPAMIDOL (ISOVUE-300) INJECTION 61%
100.0000 mL | Freq: Once | INTRAVENOUS | Status: AC | PRN
  Administered 2018-01-23: 100 mL via INTRAVENOUS

## 2018-02-14 DIAGNOSIS — Z6839 Body mass index (BMI) 39.0-39.9, adult: Secondary | ICD-10-CM | POA: Diagnosis not present

## 2018-02-14 DIAGNOSIS — Z01419 Encounter for gynecological examination (general) (routine) without abnormal findings: Secondary | ICD-10-CM | POA: Diagnosis not present

## 2018-03-06 IMAGING — CR DG CHEST 2V
2 series · 2 of 2 positions shown · non-contrast
Comparison: 07/03/2016

CLINICAL DATA: Preop left hip surgery.

EXAM:
CHEST  2 VIEW

[w chest lat]
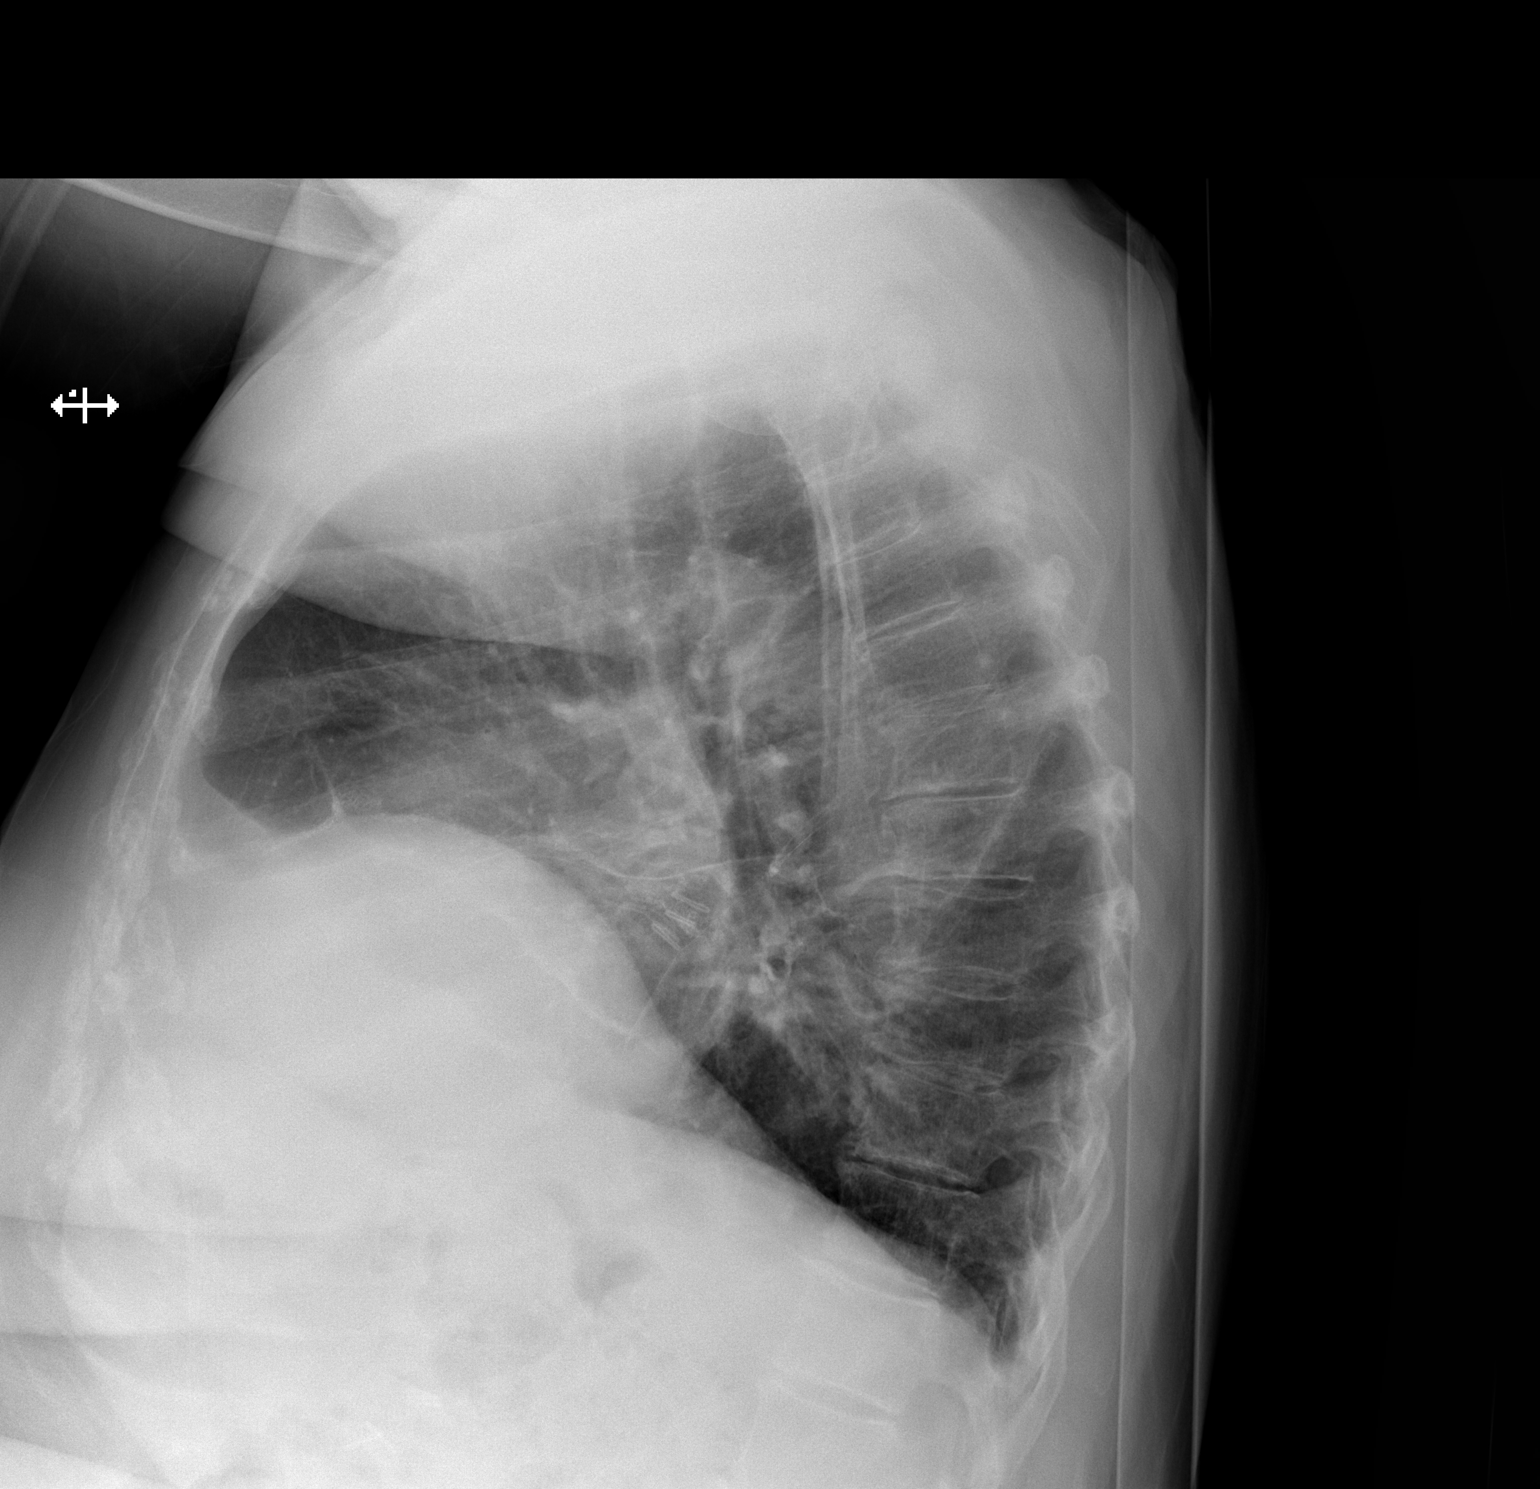

[x chest ap]
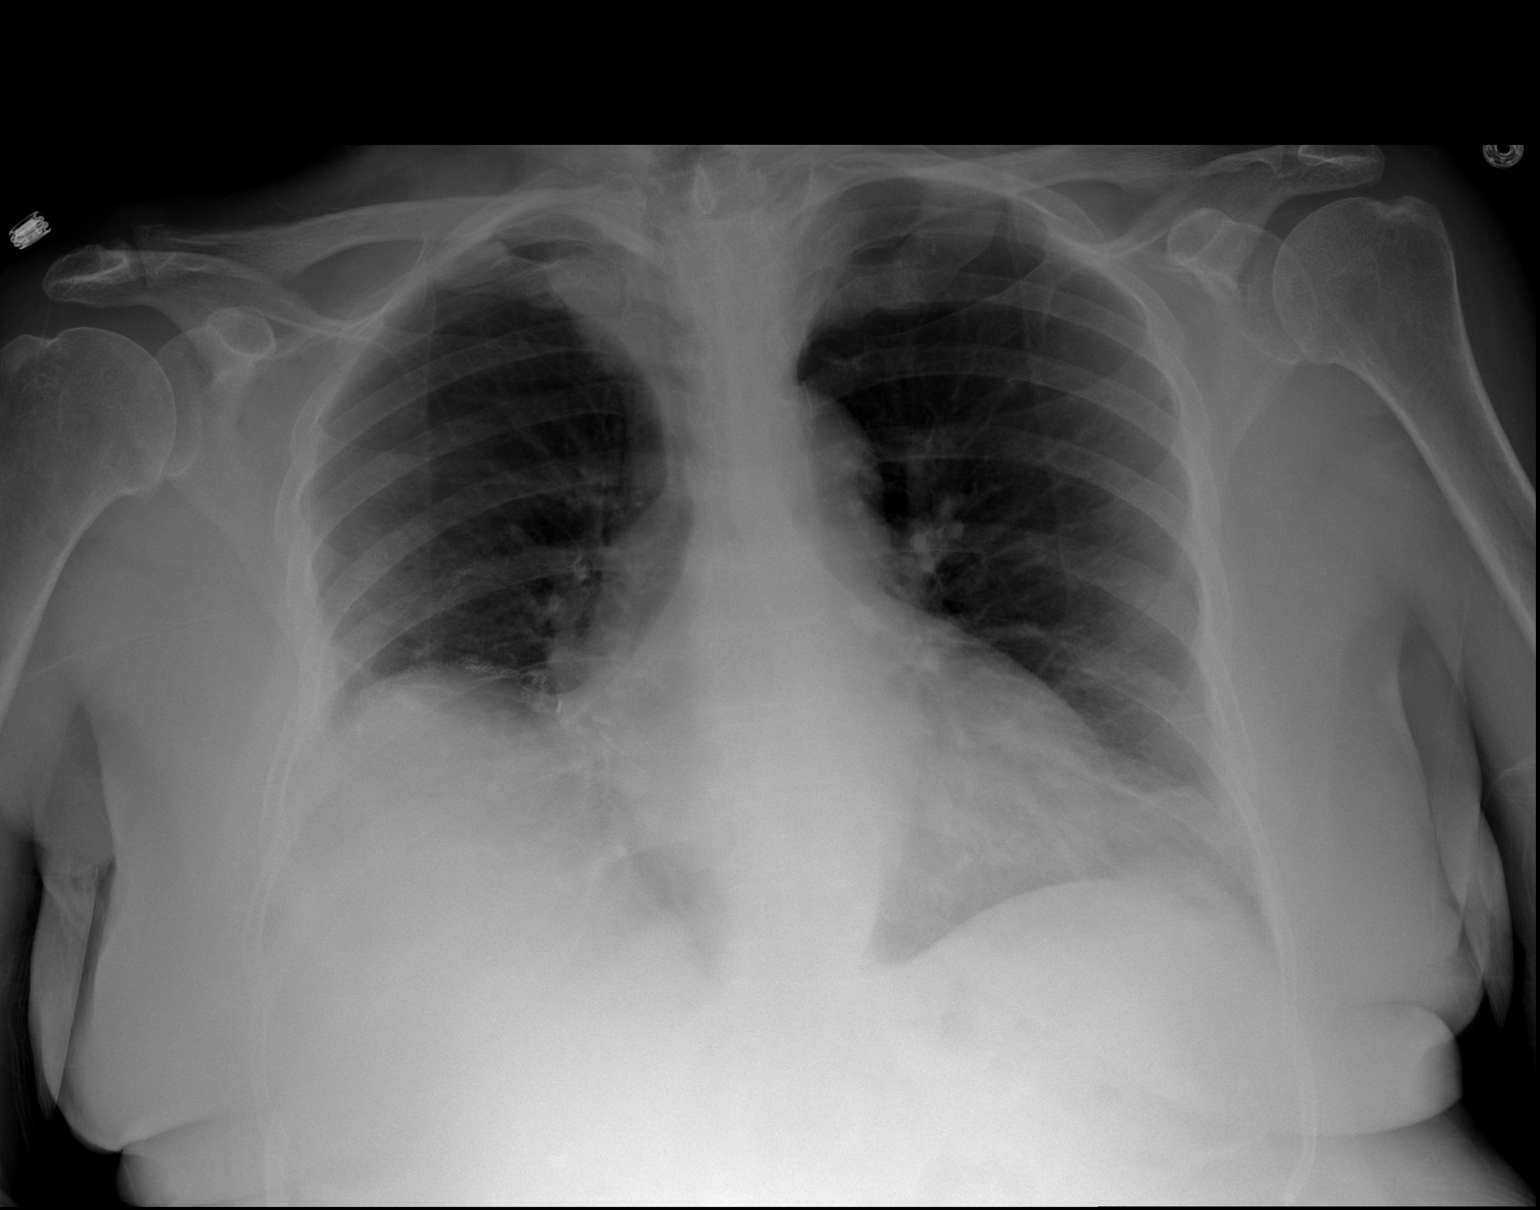

[2 of 2 positions shown; findings below may reference images not displayed]

FINDINGS: Stable right hemidiaphragm elevation. Parenchymal lung suture in the
right base. There is unchanged moderate cardiomegaly. Pulmonary
vasculature is normal. No pleural effusions. Hilar and mediastinal
contours are unremarkable and unchanged.
IMPRESSION: Stable cardiomegaly. No consolidation or effusion. Normal
vasculature.

## 2018-03-06 IMAGING — DX DG HIP (WITH OR WITHOUT PELVIS) 1V PORT*L*
2 series · 2 of 2 positions shown · non-contrast
Comparison: 04/05/2017 .

CLINICAL DATA: Left hip replacement.

EXAM:
DG HIP (WITH OR WITHOUT PELVIS) 1V PORT LEFT

[pelvis ap]
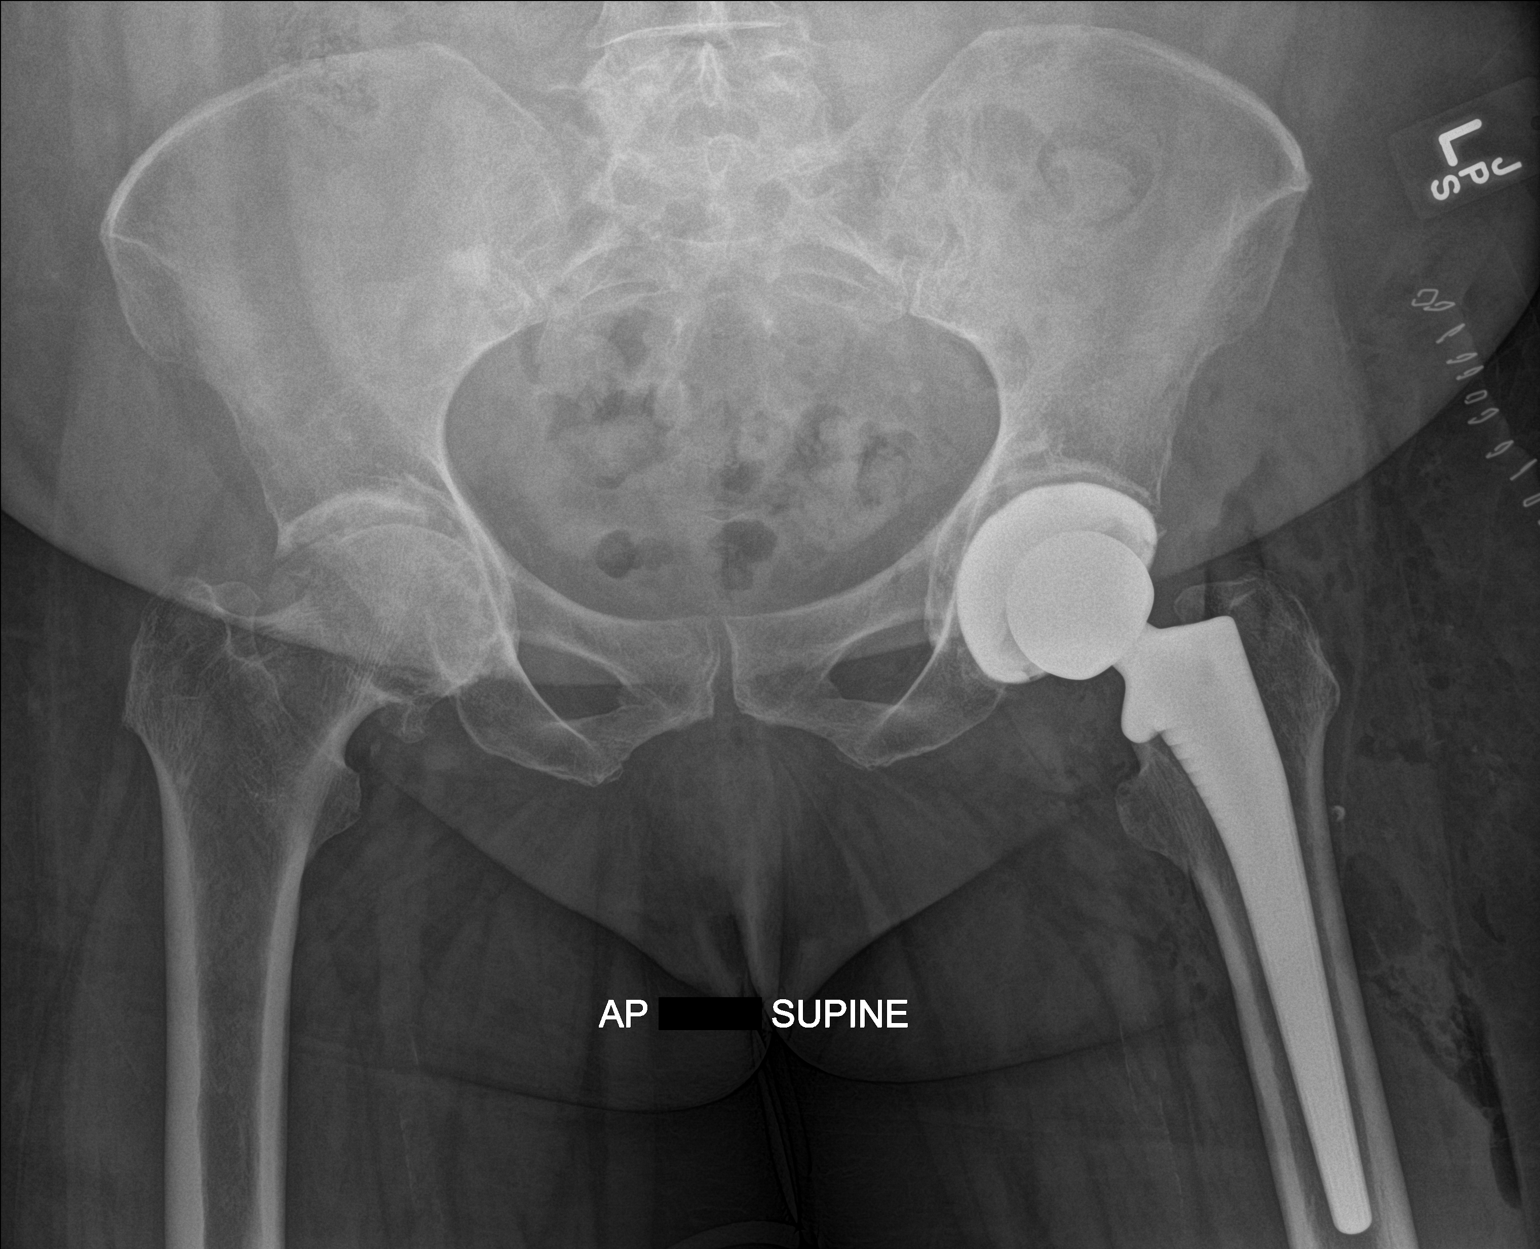

[hip lat]
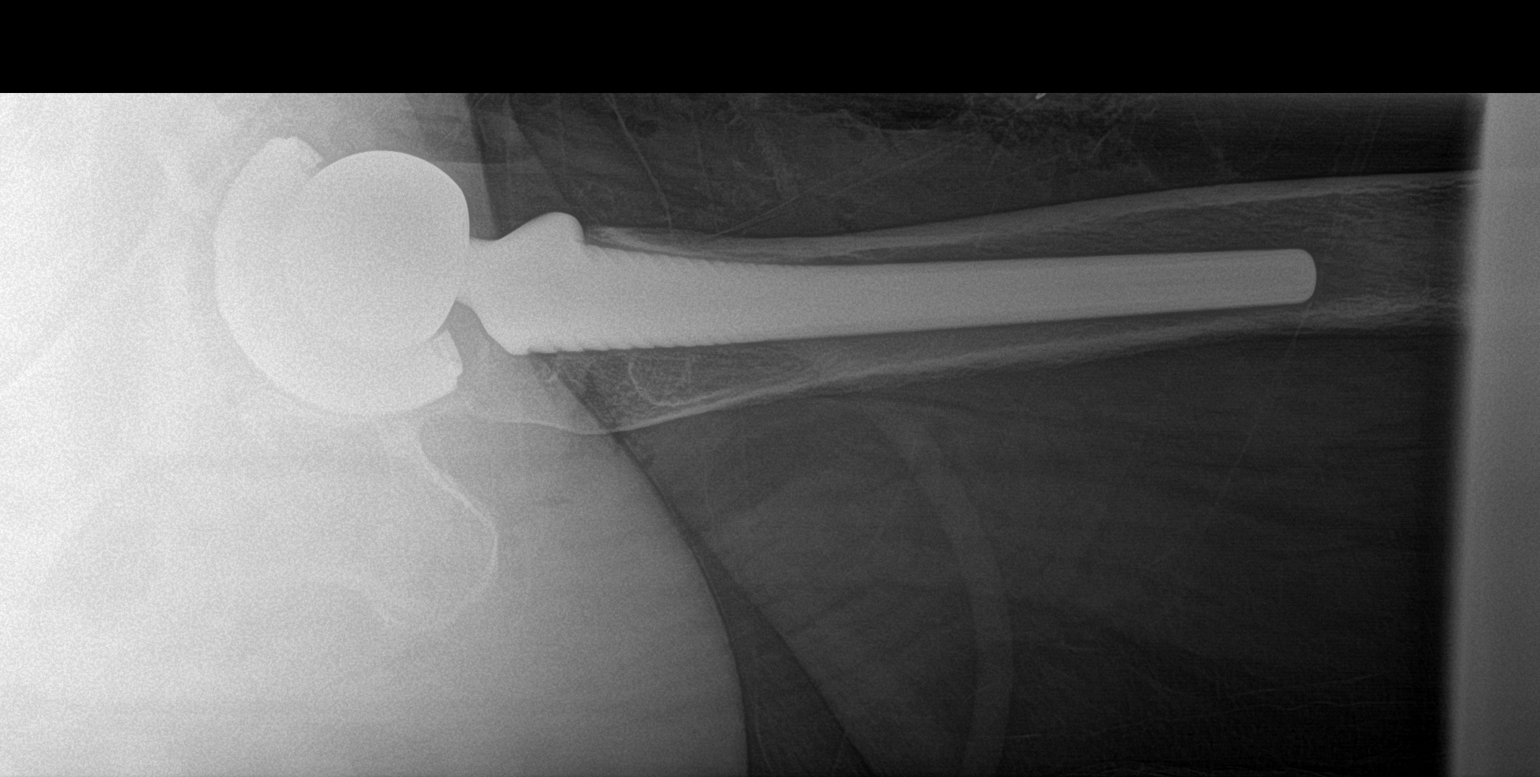

[2 of 2 positions shown; findings below may reference images not displayed]

FINDINGS: Total left hip replacement. Hardware intact. Anatomic alignment. No
acute bony abnormality identified. Degenerative changes lumbar spine
and right hip.
IMPRESSION: Total left hip replacement with anatomic alignment.

## 2018-03-27 DIAGNOSIS — H25013 Cortical age-related cataract, bilateral: Secondary | ICD-10-CM | POA: Diagnosis not present

## 2018-03-27 DIAGNOSIS — H2513 Age-related nuclear cataract, bilateral: Secondary | ICD-10-CM | POA: Diagnosis not present

## 2018-03-27 DIAGNOSIS — H40033 Anatomical narrow angle, bilateral: Secondary | ICD-10-CM | POA: Diagnosis not present

## 2018-03-27 DIAGNOSIS — H40013 Open angle with borderline findings, low risk, bilateral: Secondary | ICD-10-CM | POA: Diagnosis not present

## 2018-05-08 DIAGNOSIS — E0590 Thyrotoxicosis, unspecified without thyrotoxic crisis or storm: Secondary | ICD-10-CM | POA: Diagnosis not present

## 2018-05-15 DIAGNOSIS — E0590 Thyrotoxicosis, unspecified without thyrotoxic crisis or storm: Secondary | ICD-10-CM | POA: Diagnosis not present

## 2018-05-15 DIAGNOSIS — E7800 Pure hypercholesterolemia, unspecified: Secondary | ICD-10-CM | POA: Diagnosis not present

## 2018-05-15 DIAGNOSIS — I10 Essential (primary) hypertension: Secondary | ICD-10-CM | POA: Diagnosis not present

## 2018-05-15 DIAGNOSIS — E049 Nontoxic goiter, unspecified: Secondary | ICD-10-CM | POA: Diagnosis not present

## 2018-05-15 DIAGNOSIS — R748 Abnormal levels of other serum enzymes: Secondary | ICD-10-CM | POA: Diagnosis not present

## 2018-05-20 ENCOUNTER — Ambulatory Visit (INDEPENDENT_AMBULATORY_CARE_PROVIDER_SITE_OTHER): Payer: Medicare Other

## 2018-05-20 ENCOUNTER — Ambulatory Visit (INDEPENDENT_AMBULATORY_CARE_PROVIDER_SITE_OTHER): Payer: Medicare Other | Admitting: Orthopaedic Surgery

## 2018-05-20 ENCOUNTER — Encounter (INDEPENDENT_AMBULATORY_CARE_PROVIDER_SITE_OTHER): Payer: Self-pay | Admitting: Orthopaedic Surgery

## 2018-05-20 VITALS — BP 131/71 | HR 50 | Ht 61.0 in | Wt 194.0 lb

## 2018-05-20 DIAGNOSIS — M25551 Pain in right hip: Secondary | ICD-10-CM

## 2018-05-20 DIAGNOSIS — M25561 Pain in right knee: Secondary | ICD-10-CM

## 2018-05-20 DIAGNOSIS — M1611 Unilateral primary osteoarthritis, right hip: Secondary | ICD-10-CM

## 2018-05-20 DIAGNOSIS — G8929 Other chronic pain: Secondary | ICD-10-CM

## 2018-05-20 NOTE — Progress Notes (Signed)
Office Visit Note   Patient: [name]           Date of Birth: 05-15-1933           MRN: [id number] Visit Date: 05/20/2018              Requested by: Glendale Chard, [address] [place],  [address] PCP: Glendale Chard, MD   Assessment & Plan: Visit Diagnoses:  1. Chronic pain of right knee   2. Pain in right hip   3. Unilateral primary osteoarthritis, right hip     Plan: Patient requests proceeding with right total hip arthroplasty.  We discussed operative risk postoperative rehab.  Likely to night stay in the hospital based on her previous history from last year total hip arthroplasty on the opposite left side in June 2018.  Questions were elicited and answered.  Her daughter was present today included in the discussion.  Decision for surgery was made today.  Follow-Up Instructions: No follow-ups on file.   Orders:  Orders Placed This Encounter  Procedures  . XR HIP UNILAT W OR W/O PELVIS 2-3 VIEWS RIGHT  . XR Knee 1-2 Views Right   No orders of the defined types were placed in this encounter.     Procedures: No procedures performed   Clinical Data: No additional findings.   Subjective: Chief Complaint  Patient presents with  . Right Hip - Pain  . Right Knee - Pain    HPI 84 year old female returns post left anterior total hip arthroplasty 04/05/2017.  She is having increased progressive right hip osteoarthritis with right groin pain limp and is used a cane.  She states she is ready to schedule right total hip arthroplasty.  She also has some bilateral medial knee pain but her right groin pain and pain with right hip range of motion is much worse.  She had recent visit to PCP and had some mild elevation of liver enzymes.  She denies chills or fever.  Left total hip arthroplasty is doing well.  Review of Systems 14 point review of systems positive for history of hypertension, left total hip arthroplasty 2018.  Old tibial plateau fracture  left.  Obesity, high cholesterol, hypothyroidism, partial right lobe removal 2010, hysterectomy 76, cholecystectomy 2017, obesity BMI 36 which last year was 31, 1 year ago.  Nonspecific ST and T wave changes on EKG.  Otherwise negative as it pertains HPI.   Objective: Vital Signs: BP 131/71   Pulse (!) 50   Ht 5\' 1"  (1.549 m)   Wt 194 lb (88 kg)   BMI 36.66 kg/m   Physical Exam  Constitutional: She is oriented to person, place, and time. She appears well-developed.  HENT:  Head: Normocephalic.  Right Ear: External ear normal.  Left Ear: External ear normal.  Eyes: Pupils are equal, round, and reactive to light.  Neck: No tracheal deviation present. No thyromegaly present.  Cardiovascular: Normal rate.  Pulmonary/Chest: Effort normal.  Abdominal: Soft.  Neurological: She is alert and oriented to person, place, and time.  Skin: Skin is warm and dry.  Psychiatric: She has a normal mood and affect. Her behavior is normal.    Ortho Exam well-healed left total hip arthroplasty.  Right hip has 15 degrees internal rotation which reproduces sharp groin pain.  Positive Trendelenburg gait on the right.  Trace knee effusion with bilateral medial knee tenderness.  Mild to moderate bilateral knee crepitus.  Distal pulses are intact no sciatic notch  tenderness negative straight leg raising 90 degrees.  No venous stasis changes no pitting edema.  No right hip flexion contracture.  External rotation on the right is 30 degrees reproducing her pain.  Specialty Comments:  No specialty comments available.  Imaging: No results found.   PMFS History: Patient Active Problem List   Diagnosis Date Noted  . H/O total hip arthroplasty, left 11/07/2017  . Post-traumatic osteoarthritis of left knee 11/07/2017  . Unilateral primary osteoarthritis, right knee 11/07/2017  . Unilateral primary osteoarthritis, right hip 08/06/2017  . Presence of left artificial hip joint 05/14/2017  . Preop cardiovascular  exam 03/12/2017  . Cough   . Abdominal pain   . Symptomatic cholelithiasis 07/01/2016  . Nonspecific ST-T wave electrocardiographic changes   . Lower leg edema 02/16/2015  . Obesity (BMI 30.0-34.9) 02/16/2015  . Osteoporosis 01/11/2012  . Essential hypertension 01/11/2012  . Hyperthyroidism 01/11/2012  . Hypercholesteremia 01/11/2012  . H/O vaginal hysterectomy 1974 01/11/2012    Class: History of  . S/P removal of lung  partial R lobe  2010 01/11/2012  . History of hernia repair  R ing. 1975 01/11/2012  . BACK PAIN 11/12/2007  . DYSPNEA 11/11/2007   Past Medical History:  Diagnosis Date  . Arthritis    left hip and back  . Bronchiectasis    isolated to RML; status post right middle lobe partial resection.  . Complication of anesthesia    hard to wake up  . Dyspnea   . Gall stones   . GERD (gastroesophageal reflux disease)    history   . H/O osteoporosis   . Hypertension   . Hypothyroidism   . Yeast infection     Family History  Problem Relation Age of Onset  . Hypertension Mother   . CVA Mother 46  . Heart attack Sister   . Diabetes Sister   . Heart attack Brother   . Heart attack Brother     Past Surgical History:  Procedure Laterality Date  . ABDOMINAL HYSTERECTOMY  1974  . CHOLECYSTECTOMY N/A 07/02/2016   Procedure: LAPAROSCOPIC CHOLECYSTECTOMY;  Surgeon: Stark Klein, MD;  Location: [facility];  Service: General;  Laterality: N/A;  . COLONOSCOPY    . EYE SURGERY     stye removed  . HERNIA REPAIR  1975  . KNEE SURGERY Left   . LUNG REMOVAL, PARTIAL  208   RML  . NM MYOVIEW LTD  06/2011   Normal EF. No ischemia or infarction.  Marland Kitchen TOTAL HIP ARTHROPLASTY Left 04/05/2017   Procedure: LEFT TOTAL HIP ARTHROPLASTY ANTERIOR APPROACH;  Surgeon: [name], MD;  Location: [facility];  Service: Orthopedics;  Laterality: Left;  . TRANSTHORACIC ECHOCARDIOGRAM  06/2011   Mild concentric LVH. Normal EF with impaired relaxation. Mildly elevated RV pressures of 30 and 40 mmHg.    Marland Kitchen TRANSTHORACIC ECHOCARDIOGRAM  06/2016   normal LV size and function. EF 60-65% with GRD 2 DD. Mild aortic stenosis. Mild LA dilation. Mild to moderately increased PA pressures (46 mmHg).   Social History   Occupational History  . Not on file  Tobacco Use  . Smoking status: Never Smoker  . Smokeless tobacco: Never Used  Substance and Sexual Activity  . Alcohol use: No  . Drug use: No  . Sexual activity: Never    Birth control/protection: Abstinence

## 2018-05-21 DIAGNOSIS — R748 Abnormal levels of other serum enzymes: Secondary | ICD-10-CM | POA: Diagnosis not present

## 2018-05-23 ENCOUNTER — Encounter (INDEPENDENT_AMBULATORY_CARE_PROVIDER_SITE_OTHER): Payer: Self-pay | Admitting: Orthopaedic Surgery

## 2018-05-29 DIAGNOSIS — R945 Abnormal results of liver function studies: Secondary | ICD-10-CM | POA: Diagnosis not present

## 2018-05-29 DIAGNOSIS — Z79899 Other long term (current) drug therapy: Secondary | ICD-10-CM | POA: Diagnosis not present

## 2018-05-29 DIAGNOSIS — M1611 Unilateral primary osteoarthritis, right hip: Secondary | ICD-10-CM | POA: Diagnosis not present

## 2018-05-29 DIAGNOSIS — K219 Gastro-esophageal reflux disease without esophagitis: Secondary | ICD-10-CM | POA: Diagnosis not present

## 2018-06-02 ENCOUNTER — Other Ambulatory Visit (INDEPENDENT_AMBULATORY_CARE_PROVIDER_SITE_OTHER): Payer: Self-pay | Admitting: Orthopaedic Surgery

## 2018-06-02 NOTE — Telephone Encounter (Signed)
Ok for refill? 

## 2018-06-02 NOTE — Telephone Encounter (Signed)
OK refill thank you 

## 2018-06-04 ENCOUNTER — Telehealth (INDEPENDENT_AMBULATORY_CARE_PROVIDER_SITE_OTHER): Payer: Self-pay | Admitting: Orthopaedic Surgery

## 2018-06-04 NOTE — Telephone Encounter (Signed)
fyi

## 2018-06-04 NOTE — Telephone Encounter (Signed)
Fyi:    Still waiting on medical clearance for surgery from Dr. Baird Cancer that was sent out on 08/15.  Patient seen on 05-29-18 per [facility] and will have repeat labs.  However, they have received the clearance by fax and will respond after lab results.

## 2018-06-07 ENCOUNTER — Other Ambulatory Visit: Payer: Self-pay | Admitting: Internal Medicine

## 2018-06-07 DIAGNOSIS — R945 Abnormal results of liver function studies: Secondary | ICD-10-CM

## 2018-06-07 DIAGNOSIS — R7989 Other specified abnormal findings of blood chemistry: Secondary | ICD-10-CM

## 2018-06-10 ENCOUNTER — Other Ambulatory Visit (INDEPENDENT_AMBULATORY_CARE_PROVIDER_SITE_OTHER): Payer: Self-pay | Admitting: Orthopaedic Surgery

## 2018-06-13 ENCOUNTER — Other Ambulatory Visit (INDEPENDENT_AMBULATORY_CARE_PROVIDER_SITE_OTHER): Payer: Self-pay | Admitting: Orthopaedic Surgery

## 2018-06-19 ENCOUNTER — Ambulatory Visit
Admission: RE | Admit: 2018-06-19 | Discharge: 2018-06-19 | Disposition: A | Discharge status: Home / Self Care | Payer: Medicare Other | Source: Ambulatory Visit | Attending: Internal Medicine | Admitting: Internal Medicine

## 2018-06-19 DIAGNOSIS — K760 Fatty (change of) liver, not elsewhere classified: Secondary | ICD-10-CM | POA: Diagnosis not present

## 2018-06-19 DIAGNOSIS — R7989 Other specified abnormal findings of blood chemistry: Secondary | ICD-10-CM

## 2018-06-19 DIAGNOSIS — R945 Abnormal results of liver function studies: Secondary | ICD-10-CM

## 2018-06-30 DIAGNOSIS — Z6838 Body mass index (BMI) 38.0-38.9, adult: Secondary | ICD-10-CM | POA: Diagnosis not present

## 2018-07-02 DIAGNOSIS — N182 Chronic kidney disease, stage 2 (mild): Secondary | ICD-10-CM | POA: Diagnosis not present

## 2018-07-02 DIAGNOSIS — I129 Hypertensive chronic kidney disease with stage 1 through stage 4 chronic kidney disease, or unspecified chronic kidney disease: Secondary | ICD-10-CM | POA: Diagnosis not present

## 2018-07-07 DIAGNOSIS — M25551 Pain in right hip: Secondary | ICD-10-CM | POA: Diagnosis not present

## 2018-07-07 DIAGNOSIS — K760 Fatty (change of) liver, not elsewhere classified: Secondary | ICD-10-CM | POA: Diagnosis not present

## 2018-07-07 DIAGNOSIS — R740 Nonspecific elevation of levels of transaminase and lactic acid dehydrogenase [LDH]: Secondary | ICD-10-CM | POA: Diagnosis not present

## 2018-07-07 DIAGNOSIS — I10 Essential (primary) hypertension: Secondary | ICD-10-CM | POA: Diagnosis not present

## 2018-07-14 NOTE — Pre-Procedure Instructions (Signed)
[name]  07/14/2018      [facility] ([place] [place] [facility] [place] [address] [place] ([place]) [place] [address] Phone: [phone] Fax: [phone]  [facility] [place], [place] [address] Phone: [phone] Fax: [phone]    Your procedure is scheduled on July 25, 2018.  Report to [facility] Admitting at 530 AM.  Call this number if you have problems the morning of surgery:  [phone]   Remember:  Do not eat or drink after midnight.    Take these medicines the morning of surgery with A SIP OF WATER  Metoprolol succinate (Toprol XL) Methimazole (tapazole)  Follow your surgeon's instructions on when to hold/resume aspirin.  If no instructions were given call the office to determine how they would like to you take aspirin  7 days prior to surgery STOP taking any diclofenac (voltaren) gel, Aleve, Naproxen, Ibuprofen, Motrin, Advil, Goody's, BC's, all herbal medications, fish oil, and all vitamins   Do not wear jewelry, make-up or nail polish.  Do not wear lotions, powders, or perfumes, or deodorant.  Do not shave 48 hours prior to surgery.    Do not bring valuables to the hospital.  [facility] is not responsible for any belongings or valuables.  Contacts, dentures or bridgework may not be worn into surgery.  Leave your suitcase in the car.  After surgery it may be brought to your room.  For patients admitted to the hospital, discharge time will be determined by your treatment team.  Patients discharged the day of surgery will not be allowed to drive home.    - Preparing For Surgery  Before surgery, you can play an important role. Because skin is not sterile, your skin needs to be as free of germs as possible. You can reduce the number of germs on your skin by washing with CHG (chlorahexidine gluconate) Soap before surgery.  CHG is  an antiseptic cleaner which kills germs and bonds with the skin to continue killing germs even after washing.    Oral Hygiene is also important to reduce your risk of infection.  Remember - BRUSH YOUR TEETH THE MORNING OF SURGERY WITH YOUR REGULAR TOOTHPASTE  Please do not use if you have an allergy to CHG or antibacterial soaps. If your skin becomes reddened/irritated stop using the CHG.  Do not shave (including legs and underarms) for at least 48 hours prior to first CHG shower. It is OK to shave your face.  Please follow these instructions carefully.   1. Shower the NIGHT BEFORE SURGERY and the MORNING OF SURGERY with CHG.   2. If you chose to wash your hair, wash your hair first as usual with your normal shampoo.  3. After you shampoo, rinse your hair and body thoroughly to remove the shampoo.  4. Use CHG as you would any other liquid soap. You can apply CHG directly to the skin and wash gently with a scrungie or a clean washcloth.   5. Apply the CHG Soap to your body ONLY FROM THE NECK DOWN.  Do not use on open wounds or open sores. Avoid contact with your eyes, ears, mouth and genitals (private parts). Wash Face and genitals (private parts)  with your normal soap.  6. Wash thoroughly, paying special attention to the area where your surgery will be performed.  7. Thoroughly rinse your  body with warm water from the neck down.  8. DO NOT shower/wash with your normal soap after using and rinsing off the CHG Soap.  9. Pat yourself dry with a CLEAN TOWEL.  10. Wear CLEAN PAJAMAS to bed the night before surgery, wear comfortable clothes the morning of surgery  11. Place CLEAN SHEETS on your bed the night of your first shower and DO NOT SLEEP WITH PETS.    Day of Surgery:  Do not apply any deodorants/lotions.  Please wear clean clothes to the hospital/surgery center.   Remember to brush your teeth WITH YOUR REGULAR TOOTHPASTE.   Please read over the following fact sheets that  you were given. Pain Booklet, Coughing and Deep Breathing, MRSA Information and Surgical Site Infection Prevention

## 2018-07-15 ENCOUNTER — Telehealth: Payer: Self-pay

## 2018-07-15 ENCOUNTER — Ambulatory Visit (HOSPITAL_COMMUNITY)
Admission: RE | Admit: 2018-07-15 | Discharge: 2018-07-15 | Disposition: A | Discharge status: Home / Self Care | Payer: Medicare Other | Source: Ambulatory Visit | Attending: Surgery | Admitting: Surgery

## 2018-07-15 ENCOUNTER — Encounter (HOSPITAL_COMMUNITY)
Admission: RE | Admit: 2018-07-15 | Discharge: 2018-07-15 | Disposition: A | Discharge status: Home / Self Care | Payer: Medicare Other | Source: Ambulatory Visit | Attending: Orthopaedic Surgery | Admitting: Orthopaedic Surgery

## 2018-07-15 ENCOUNTER — Other Ambulatory Visit: Payer: Self-pay

## 2018-07-15 ENCOUNTER — Encounter (HOSPITAL_COMMUNITY): Payer: Self-pay

## 2018-07-15 DIAGNOSIS — Z01818 Encounter for other preprocedural examination: Secondary | ICD-10-CM

## 2018-07-15 DIAGNOSIS — I498 Other specified cardiac arrhythmias: Secondary | ICD-10-CM | POA: Insufficient documentation

## 2018-07-15 HISTORY — DX: Pneumonia, unspecified organism: J18.9

## 2018-07-15 LAB — TYPE AND SCREEN
ABO/RH(D): A POS
Antibody Screen: NEGATIVE

## 2018-07-15 LAB — CBC
HCT: 40.3 % (ref 36.0–46.0)
Hemoglobin: 13.5 g/dL (ref 12.0–15.0)
MCH: 27.6 pg (ref 26.0–34.0)
MCHC: 33.5 g/dL (ref 30.0–36.0)
MCV: 82.4 fL (ref 80.0–100.0)
Platelets: 319 10*3/uL (ref 150–400)
RBC: 4.89 MIL/uL (ref 3.87–5.11)
RDW: 13.5 % (ref 11.5–15.5)
WBC: 5.3 10*3/uL (ref 4.0–10.5)
nRBC: 0 % (ref 0.0–0.2)

## 2018-07-15 LAB — COMPREHENSIVE METABOLIC PANEL
ALT: 168 U/L — ABNORMAL HIGH (ref 0–44)
AST: 180 U/L — ABNORMAL HIGH (ref 15–41)
Albumin: 3.6 g/dL (ref 3.5–5.0)
Alkaline Phosphatase: 176 U/L — ABNORMAL HIGH (ref 38–126)
Anion gap: 9 (ref 5–15)
BUN: 13 mg/dL (ref 8–23)
CO2: 23 mmol/L (ref 22–32)
Calcium: 9.1 mg/dL (ref 8.9–10.3)
Chloride: 106 mmol/L (ref 98–111)
Creatinine, Ser: 0.58 mg/dL (ref 0.44–1.00)
GFR calc Af Amer: >60 mL/min (ref >60)
GFR calc non Af Amer: >60 mL/min (ref >60)
Glucose, Bld: 89 mg/dL (ref 70–99)
Potassium: 4.2 mmol/L (ref 3.5–5.1)
Sodium: 138 mmol/L (ref 135–145)
Total Bilirubin: 0.9 mg/dL (ref 0.3–1.2)
Total Protein: 7.5 g/dL (ref 6.5–8.1)

## 2018-07-15 LAB — URINALYSIS, ROUTINE W REFLEX MICROSCOPIC
Bilirubin Urine: NEGATIVE
Glucose, UA: NEGATIVE mg/dL
Hgb urine dipstick: NEGATIVE
Ketones, ur: NEGATIVE mg/dL
Leukocytes, UA: NEGATIVE
Nitrite: POSITIVE — AB
Protein, ur: NEGATIVE mg/dL
Specific Gravity, Urine: 1.021 (ref 1.005–1.030)
pH: 5 (ref 5.0–8.0)

## 2018-07-15 LAB — SURGICAL PCR SCREEN
MRSA, PCR: NEGATIVE
Staphylococcus aureus: NEGATIVE

## 2018-07-15 NOTE — Telephone Encounter (Signed)
Cathy from [facility] called stating the pt has a surgery coming up soon and she wanted to verify the medications she stated pt has stopped all her medications I spoke with Dr.Sanders and she stated she did not tell the pt to discontinue her medications the pt did that on her own probably because her liver is elevated. YRL,RMA

## 2018-07-15 NOTE — Progress Notes (Addendum)
PCP:  Dr. Glendale Chard @ [facility] Endocrinologist: Dr. Kristeen Mans Cardiologist: pt. Not sure  Pt. Reported Dr. Suzette Battiest told her to stop the thyroid medicine and she has been off it for 1 month. She also reported Dr. Baird Cancer told her to stop all her medications except for the aspirin and heart pill, but pt. States she stopped everything. I contacted Dr. Baird Cancer office, spoke to nurse Dia Sitter, and she stated Dr. Baird Cancer did not tell her to stop any medications. I explained to pt, she needed to continue her medicines. Pt. To stop NSAIDS/vitamins/herbal medications/aspirin 1 week prior to surgery. Pt. Verbalized understanding .  Left message at Dr. Duaine Dredge office with Jackelyn Poling, for him to review abnormal lads including urine test.

## 2018-07-16 ENCOUNTER — Encounter (HOSPITAL_COMMUNITY): Payer: Self-pay | Admitting: Physician Assistant

## 2018-07-16 ENCOUNTER — Encounter (INDEPENDENT_AMBULATORY_CARE_PROVIDER_SITE_OTHER): Payer: Self-pay | Admitting: Surgery

## 2018-07-16 ENCOUNTER — Ambulatory Visit (INDEPENDENT_AMBULATORY_CARE_PROVIDER_SITE_OTHER): Payer: Medicare Other | Admitting: Surgery

## 2018-07-16 VITALS — BP 126/78 | HR 54

## 2018-07-16 DIAGNOSIS — M25551 Pain in right hip: Secondary | ICD-10-CM

## 2018-07-16 DIAGNOSIS — R748 Abnormal levels of other serum enzymes: Secondary | ICD-10-CM

## 2018-07-16 NOTE — Progress Notes (Signed)
84 year old white female comes in today for preop evaluation for right total hip replacement.  Patient has a history of abnormal liver enzymes and is followed by gastroneurologist Dr. Carol Ada.  We have received preop clearance from him the patient went for preop labs yesterday.  AST 180, ALT 168, alk phos 176.  I did speak with Dr. Benson Norway and he did review the labs.  At this time he does recommend postponing the surgical  procedure due to the potential risk that are involved with anesthesia.  He states that he will contact [name] to have her come in for evaluation.  After reviewing patient's chart she was also cleared by cardiology last year before left total hip replacement.  She was given a clearance form to take to that office as well.  All questions answered.

## 2018-07-21 ENCOUNTER — Encounter: Payer: Self-pay | Admitting: Internal Medicine

## 2018-07-21 ENCOUNTER — Ambulatory Visit (INDEPENDENT_AMBULATORY_CARE_PROVIDER_SITE_OTHER): Payer: Medicare Other | Admitting: Internal Medicine

## 2018-07-21 VITALS — BP 128/90 | HR 59 | Temp 98.3°F | Ht 65.0 in | Wt 191.4 lb

## 2018-07-21 DIAGNOSIS — M1611 Unilateral primary osteoarthritis, right hip: Secondary | ICD-10-CM | POA: Diagnosis not present

## 2018-07-21 DIAGNOSIS — N182 Chronic kidney disease, stage 2 (mild): Secondary | ICD-10-CM

## 2018-07-21 DIAGNOSIS — R945 Abnormal results of liver function studies: Secondary | ICD-10-CM

## 2018-07-21 DIAGNOSIS — I129 Hypertensive chronic kidney disease with stage 1 through stage 4 chronic kidney disease, or unspecified chronic kidney disease: Secondary | ICD-10-CM | POA: Diagnosis not present

## 2018-07-21 DIAGNOSIS — R7989 Other specified abnormal findings of blood chemistry: Secondary | ICD-10-CM

## 2018-07-21 NOTE — Progress Notes (Signed)
Subjective:     Patient ID: [name] , female    DOB: [birth year] , 84 y.o.   MRN: [id number]   SHE IS HERE TODAY TO FURTHER DISCUSS HER ELEVATED LFTS. SHE WANTS TO GO OVER MATERIALS SHE HAS FOUND ONLINE.   Hypertension  This is a chronic problem. The current episode started more than 1 year ago. The problem has been gradually improving since onset. The problem is controlled. Pertinent negatives include no blurred vision, chest pain, headaches, neck pain, palpitations, shortness of breath or sweats.     Past Medical History:  Diagnosis Date  . Arthritis    left hip and back  . Bronchiectasis    isolated to RML; status post right middle lobe partial resection.  . Complication of anesthesia    hard to wake up  . Dyspnea   . Gall stones   . GERD (gastroesophageal reflux disease)    history   . H/O osteoporosis   . Hypertension   . Hypothyroidism   . Pneumonia   . Yeast infection       Current Outpatient Medications:  .  CALCIUM-MAGNESUIUM-ZINC 333-133-8.3 MG TABS, Take 1 tablet by mouth daily., Disp: , Rfl:  .  furosemide (LASIX) 20 MG tablet, Take 20 mg by mouth daily.  , Disp: , Rfl:  .  metoprolol succinate (TOPROL-XL) 25 MG 24 hr tablet, Take 25 mg by mouth daily., Disp: , Rfl:  .  potassium chloride SA (K-DUR,KLOR-CON) 20 MEQ tablet, Take 20 mEq by mouth daily. , Disp: , Rfl:  .  aspirin EC 81 MG tablet, Take 81 mg by mouth daily., Disp: , Rfl:  .  cholecalciferol (VITAMIN D) 1000 UNITS tablet, Take 1,000 Units by mouth daily., Disp: , Rfl:  .  diclofenac sodium (VOLTAREN) 1 % GEL, APPLY  2-4 GRAMS TO EACH KNEE  TOPICALLY UP TO  4 TIMES DAILY (Patient taking differently: Apply 1 application topically 3 (three) times daily as needed. ), Disp: 2 Tube, Rfl: 1 .  Flaxseed, Linseed, (FLAX SEED OIL PO), Take 1 capsule by mouth daily. , Disp: , Rfl:  .  methimazole (TAPAZOLE) 5 MG tablet, Take 5 mg by mouth daily., Disp: , Rfl:  .  nystatin cream (MYCOSTATIN), Apply 1  application topically daily as needed for dry skin. Apply under breasts and on stomach folds, Disp: , Rfl:  .  vitamin C (ASCORBIC ACID) 500 MG tablet, Take 500 mg by mouth daily., Disp: , Rfl:    Allergies  Allergen Reactions  . Ibuprofen Other (See Comments)    hallucinations  . Naproxen Sodium Other (See Comments)    hallucinations     Review of Systems  Constitutional: Negative.   HENT: Negative.   Eyes: Negative for blurred vision.  Respiratory: Negative.  Negative for shortness of breath.   Cardiovascular: Negative for chest pain and palpitations.  Gastrointestinal: Negative.   Endocrine: Negative.   Musculoskeletal: Positive for arthralgias (SHE C/O R HIP PAIN. SCHEDULED FOR R-THR, BUT DATE HAS BEEN POSTPONED DUE TO LIVER ISSUE. ). Negative for neck pain.  Neurological: Negative.  Negative for headaches.  Psychiatric/Behavioral: Negative.      Today's Vitals   07/21/18 1554  BP: 128/90  Pulse: (!) 59  Temp: 98.3 F (36.8 C)  TempSrc: Oral  Weight: 191 lb 6.4 oz (86.8 kg)  Height: 5\' 5"  (1.651 m)  PainSc: 6   PainLoc: Hip   Body mass index is 31.85 kg/m.   Objective:  Physical Exam  Constitutional: She is oriented to person, place, and time. She appears well-developed and well-nourished.  HENT:  Head: Normocephalic and atraumatic.  Neck: Normal range of motion.  Cardiovascular: Normal rate, regular rhythm and normal heart sounds.  Pulmonary/Chest: Effort normal and breath sounds normal.  Musculoskeletal: She exhibits tenderness (R LAT HIP TENDERNESS TO PALPATION. ).  Neurological: She is alert and oriented to person, place, and time.  Skin: Skin is warm and dry.  Psychiatric: She has a normal mood and affect.  Nursing note and vitals reviewed.       Assessment And Plan:     1. Elevated liver function tests  SHE WILL RTO IN TEN DAYS FOR LABWORK. SHE WAS COMMENDED FOR LIFESTYLE CHANGES INCLUDING CESSATION OF REFINED CARBS INTAKE, INCREASED ACTIVITY.  WORKUP HAS INCLUDED RUQ U/S - REVEALED MILD HEPATIC STEATOSIS. REFERRAL PLACED TO GI, WHO ORDERED CT SCAN ABDOMEN/PELVIS. QUESTION IF THIS IS STONE.   2. Primary osteoarthritis of right hip CHRONIC. SHE IS AWAITING NEW SURGICAL DATE.   3. Hypertensive nephropathy  WELL CONTROLLED. SHE WILL CONTINUE WITH CURRENT MEDS. SHE IS ENCOURAGED TO AVOID ADDING SALT TO HER FOODS.   4. Chronic renal disease, stage II  CHRONIC. SHE IS ENCOURAGED TO STAY WELL HYDRATED.        Maximino Greenland, MD

## 2018-07-25 ENCOUNTER — Inpatient Hospital Stay (HOSPITAL_COMMUNITY): Admission: RE | Admit: 2018-07-25 | Payer: Medicare Other | Source: Ambulatory Visit | Admitting: Orthopaedic Surgery

## 2018-07-25 ENCOUNTER — Encounter (HOSPITAL_COMMUNITY): Admission: RE | Payer: Self-pay | Source: Ambulatory Visit

## 2018-07-25 SURGERY — ARTHROPLASTY, HIP, TOTAL, ANTERIOR APPROACH
Anesthesia: General | Laterality: Right

## 2018-07-27 NOTE — Progress Notes (Signed)
Cardiology Office Note   Date:  07/27/2018   ID:  Savannah, Smith [birth year], MRN [id number]  PCP:  Glendale Chard, MD  Cardiologist: Dr. Ellyn Hack No chief complaint on file.    History of Present Illness: [name] is a 84 y.o. female who presents for ongoing assessment and management of chronic chest pain which is felt to be musculoskeletal, chronic dyspnea and fatigue.  The patient had a normal stress test and echocardiogram in September 2012.  Echocardiogram revealed normal EF of 60% to 65% with grade 2 diastolic dysfunction, mild aortic stenosis, mild LA dilatation, mild to moderately increased PA pressures.  She was last seen in the office by Dr. Ellyn Hack on 03/11/2017 as a preoperative evaluation for hip surgery.  She was cleared to have hip surgery as this was a low risk for cardiac event with less than 1% estimated risk of MI ,PE, V. fib or V. tach.  She is here again today for a second pre-operative evaluation to have right hip surgery. She denies chest pain or dyspnea. Her activity level is reduced due to chronic pain in the right hip.   She is also worried about elevated LFT's that were found on recent labs. PCP has referred her to GI and CT scan is planned to evaluate further, with concerns about bile duct stone. She is confused about her medications, brings two lists that are not the same. She is not sure that she is on Tricor or not. "Its on my list."  She is not listed to be taking it on last PCP office visit.   Past Medical History:  Diagnosis Date  . Arthritis    left hip and back  . Bronchiectasis    isolated to RML; status post right middle lobe partial resection.  . Complication of anesthesia    hard to wake up  . Dyspnea   . Gall stones   . GERD (gastroesophageal reflux disease)    history   . H/O osteoporosis   . Hypertension   . Hypothyroidism   . Pneumonia   . Yeast infection     Past Surgical History:  Procedure Laterality Date  . ABDOMINAL  HYSTERECTOMY  1974  . CHOLECYSTECTOMY N/A 07/02/2016   Procedure: LAPAROSCOPIC CHOLECYSTECTOMY;  Surgeon: Stark Klein, MD;  Location: [place];  Service: General;  Laterality: N/A;  . COLONOSCOPY    . EYE SURGERY     stye removed  . HERNIA REPAIR  1975  . KNEE SURGERY Left   . LUNG REMOVAL, PARTIAL  208   RML  . NM MYOVIEW LTD  06/2011   Normal EF. No ischemia or infarction.  Marland Kitchen TOTAL HIP ARTHROPLASTY Left 04/05/2017   Procedure: LEFT TOTAL HIP ARTHROPLASTY ANTERIOR APPROACH;  Surgeon: [name], MD;  Location: [place] [facility];  Service: Orthopedics;  Laterality: Left;  . TRANSTHORACIC ECHOCARDIOGRAM  06/2011   Mild concentric LVH. Normal EF with impaired relaxation. Mildly elevated RV pressures of 30 and 40 mmHg.  Marland Kitchen TRANSTHORACIC ECHOCARDIOGRAM  06/2016   normal LV size and function. EF 60-65% with GRD 2 DD. Mild aortic stenosis. Mild LA dilation. Mild to moderately increased PA pressures (46 mmHg).     Current Outpatient Medications  Medication Sig Dispense Refill  . aspirin EC 81 MG tablet Take 81 mg by mouth daily.    Marland Kitchen CALCIUM-MAGNESUIUM-ZINC 333-133-8.3 MG TABS Take 1 tablet by mouth daily.    . cholecalciferol (VITAMIN D) 1000 UNITS tablet Take 1,000 Units by mouth daily.    Marland Kitchen  diclofenac sodium (VOLTAREN) 1 % GEL APPLY  2-4 GRAMS TO EACH KNEE  TOPICALLY UP TO  4 TIMES DAILY (Patient taking differently: Apply 1 application topically 3 (three) times daily as needed. ) 2 Tube 1  . Flaxseed, Linseed, (FLAX SEED OIL PO) Take 1 capsule by mouth daily.     . furosemide (LASIX) 20 MG tablet Take 20 mg by mouth daily.      . methimazole (TAPAZOLE) 5 MG tablet Take 5 mg by mouth daily.    . metoprolol succinate (TOPROL-XL) 25 MG 24 hr tablet Take 25 mg by mouth daily.    Marland Kitchen nystatin cream (MYCOSTATIN) Apply 1 application topically daily as needed for dry skin. Apply under breasts and on stomach folds    . potassium chloride SA (K-DUR,KLOR-CON) 20 MEQ tablet Take 20 mEq by mouth daily.     . vitamin  C (ASCORBIC ACID) 500 MG tablet Take 500 mg by mouth daily.     No current facility-administered medications for this visit.     Allergies:   Ibuprofen and Naproxen sodium    Social History:  The patient  reports that she has never smoked. She has never used smokeless tobacco. She reports that she does not drink alcohol or use drugs.   Family History:  The patient's family history includes CVA (age of onset: 56) in her mother; Diabetes in her sister; Heart attack in her brother, brother, and sister; Hypertension in her mother.    ROS: All other systems are reviewed and negative. Unless otherwise mentioned in H&P    PHYSICAL EXAM: VS:  There were no vitals taken for this visit. , BMI There is no height or weight on file to calculate BMI. GEN: Well nourished, well developed, in no acute distress HEENT: normal Neck: no JVD, carotid bruits, or masses Cardiac: RRR; 1/6 systolic murmurs, rubs, or gallops,no edema  Respiratory:  Clear to auscultation bilaterally, normal work of breathing GI: soft, nontender, nondistended, + BS MS: no deformity or atrophy Skin: warm and dry, no rash Neuro:  Strength and sensation are intact Psych: euthymic mood, full affect   EKG:  SR with PAC's with early repolarization rate of 70 bpm non-specific T-wave abnormalities.   Recent Labs: 07/15/2018: ALT 168; BUN 13; Creatinine, Ser 0.58; Hemoglobin 13.5; Platelets 319; Potassium 4.2; Sodium 138    Lipid Panel No results found for: CHOL, TRIG, HDL, CHOLHDL, VLDL, LDLCALC, LDLDIRECT    Wt Readings from Last 3 Encounters:  07/21/18 191 lb 6.4 oz (86.8 kg)  07/15/18 194 lb (88 kg)  05/20/18 194 lb (88 kg)      Other studies Reviewed: Echocardiogram 07/26/2016  - Left ventricle: The cavity size was normal. Systolic function was   normal. The estimated ejection fraction was in the range of 60%   to 65%. Wall motion was normal; there were no regional wall   motion abnormalities. Features are  consistent with a pseudonormal   left ventricular filling pattern, with concomitant abnormal   relaxation and increased filling pressure (grade 2 diastolic   dysfunction). - Aortic valve: There was very mild stenosis. Valve area (VTI):   2.01 cm^2. Valve area (Vmax): 2.09 cm^2. Valve area (Vmean): 1.96   cm^2. - Mitral valve: Calcified annulus. - Left atrium: The atrium was mildly dilated. - Pulmonary arteries: Systolic pressure was mildly to moderately   increased. PA peak pressure: 46 mm Hg (S).   ASSESSMENT AND PLAN:  1. Pre-Operative cardiac clearance:    Chart reviewed as  part of pre-operative protocol coverage. Given past medical history and time since last visit, based on ACC/AHA guidelines, [name] would be at acceptable risk for the planned procedure without further cardiovascular testing.   2. Elevated liver enzymes: She is confused about her medications and is uncertain if she is taking Tricor or not. She has been taken off of it in the past. She also has cholelithiasis, with possible bile duct occlusion. She is being referred to GI and is awaiting appointment. I would stop Tricor until she has been evaluated more thoroughly.   3. Hypertension: BP is low normal No changes in her regimen.   .  Current medicines are reviewed at length with the patient today.    Labs/ tests ordered today include: None  Phill Myron. West Pugh, ANP, AACC   07/27/2018 6:07 PM    [address] Office [phone] Fax [phone]

## 2018-07-28 ENCOUNTER — Encounter: Payer: Self-pay | Admitting: Adult Health

## 2018-07-28 ENCOUNTER — Ambulatory Visit (INDEPENDENT_AMBULATORY_CARE_PROVIDER_SITE_OTHER): Payer: Medicare Other | Admitting: Adult Health

## 2018-07-28 ENCOUNTER — Other Ambulatory Visit: Payer: Self-pay | Admitting: Gastroenterology

## 2018-07-28 VITALS — BP 108/70 | HR 77 | Ht 65.0 in | Wt 192.2 lb

## 2018-07-28 DIAGNOSIS — R945 Abnormal results of liver function studies: Principal | ICD-10-CM

## 2018-07-28 DIAGNOSIS — Z01810 Encounter for preprocedural cardiovascular examination: Secondary | ICD-10-CM

## 2018-07-28 DIAGNOSIS — E7800 Pure hypercholesterolemia, unspecified: Secondary | ICD-10-CM | POA: Diagnosis not present

## 2018-07-28 DIAGNOSIS — I10 Essential (primary) hypertension: Secondary | ICD-10-CM

## 2018-07-28 DIAGNOSIS — R7989 Other specified abnormal findings of blood chemistry: Secondary | ICD-10-CM

## 2018-07-28 NOTE — Patient Instructions (Signed)
Follow-Up: You will need a follow up appointment. You may see  DR Ellyn Hack, -or- one of the following Advanced Practice Providers on your designated Care Team:   . Rosaria Ferries, PA-C . Jory Sims, DNP, ANP  Medication Instructions:  STOP TRICOR-CLEARED FOR SURGERY MUST STILL F/U WITH GI DEPARTMENT  If you need a refill on your cardiac medications before your next appointment, please call your pharmacy.  Labwork: If you have labs (blood work) drawn today and your tests are completely normal, you will receive your results ONLY by: . MyChart Message (if you have MyChart) -OR- . A paper copy in the mail  At [facility], you and your health needs are our priority.  As part of our continuing mission to provide you with exceptional heart care, we have created designated Provider Care Teams.  These Care Teams include your primary Cardiologist (physician) and Advanced Practice Providers (APPs -  Physician Assistants and Nurse Practitioners) who all work together to provide you with the care you need, when you need it.  Thank you for choosing [facility] at [facility]!!

## 2018-07-30 ENCOUNTER — Other Ambulatory Visit: Payer: Self-pay

## 2018-07-30 ENCOUNTER — Other Ambulatory Visit: Payer: Self-pay | Admitting: Gastroenterology

## 2018-07-30 ENCOUNTER — Other Ambulatory Visit: Payer: Medicare Other

## 2018-07-30 ENCOUNTER — Ambulatory Visit (HOSPITAL_COMMUNITY)
Admission: RE | Admit: 2018-07-30 | Discharge: 2018-07-30 | Disposition: A | Discharge status: Home / Self Care | Payer: Medicare Other | Source: Ambulatory Visit | Attending: Gastroenterology | Admitting: Gastroenterology

## 2018-07-30 ENCOUNTER — Encounter (HOSPITAL_COMMUNITY): Payer: Self-pay | Admitting: Radiology

## 2018-07-30 DIAGNOSIS — R945 Abnormal results of liver function studies: Secondary | ICD-10-CM | POA: Diagnosis not present

## 2018-07-30 DIAGNOSIS — R935 Abnormal findings on diagnostic imaging of other abdominal regions, including retroperitoneum: Secondary | ICD-10-CM | POA: Diagnosis not present

## 2018-07-30 DIAGNOSIS — R7989 Other specified abnormal findings of blood chemistry: Secondary | ICD-10-CM | POA: Insufficient documentation

## 2018-07-30 DIAGNOSIS — K8050 Calculus of bile duct without cholangitis or cholecystitis without obstruction: Secondary | ICD-10-CM | POA: Insufficient documentation

## 2018-07-30 DIAGNOSIS — K449 Diaphragmatic hernia without obstruction or gangrene: Secondary | ICD-10-CM | POA: Diagnosis not present

## 2018-07-30 DIAGNOSIS — R933 Abnormal findings on diagnostic imaging of other parts of digestive tract: Secondary | ICD-10-CM | POA: Insufficient documentation

## 2018-07-30 MED ORDER — GADOBUTROL 1 MMOL/ML IV SOLN
10.0000 mL | Freq: Once | INTRAVENOUS | Status: AC | PRN
  Administered 2018-07-30: 8 mL via INTRAVENOUS

## 2018-07-31 LAB — HEPATIC FUNCTION PANEL
ALT: 49 IU/L — ABNORMAL HIGH (ref 0–32)
AST: 45 IU/L — ABNORMAL HIGH (ref 0–40)
Albumin: 3.9 g/dL (ref 3.5–4.7)
Alkaline Phosphatase: 153 IU/L — ABNORMAL HIGH (ref 39–117)
Bilirubin Total: 0.5 mg/dL (ref 0.0–1.2)
Bilirubin, Direct: 0.21 mg/dL (ref 0.00–0.40)
Total Protein: 7.2 g/dL (ref 6.0–8.5)

## 2018-08-01 ENCOUNTER — Other Ambulatory Visit: Payer: Self-pay | Admitting: Gastroenterology

## 2018-08-03 NOTE — Progress Notes (Signed)
Your liver enzymes are improving. How are you feeling?

## 2018-08-05 ENCOUNTER — Encounter (HOSPITAL_COMMUNITY): Payer: Self-pay | Admitting: *Deleted

## 2018-08-05 ENCOUNTER — Other Ambulatory Visit: Payer: Self-pay

## 2018-08-06 ENCOUNTER — Inpatient Hospital Stay (INDEPENDENT_AMBULATORY_CARE_PROVIDER_SITE_OTHER): Payer: Medicare Other | Admitting: Orthopaedic Surgery

## 2018-08-07 ENCOUNTER — Encounter (HOSPITAL_COMMUNITY): Payer: Self-pay | Admitting: *Deleted

## 2018-08-07 ENCOUNTER — Encounter (HOSPITAL_COMMUNITY)
Admission: RE | Disposition: A | Discharge status: Home / Self Care | Payer: Self-pay | Source: Ambulatory Visit | Attending: Gastroenterology

## 2018-08-07 ENCOUNTER — Ambulatory Visit (HOSPITAL_COMMUNITY): Payer: Medicare Other | Admitting: Anesthesiology

## 2018-08-07 ENCOUNTER — Other Ambulatory Visit: Payer: Self-pay

## 2018-08-07 ENCOUNTER — Ambulatory Visit (HOSPITAL_COMMUNITY)
Admission: RE | Admit: 2018-08-07 | Discharge: 2018-08-07 | Disposition: A | Discharge status: Home / Self Care | Payer: Medicare Other | Source: Ambulatory Visit | Attending: Gastroenterology | Admitting: Gastroenterology

## 2018-08-07 ENCOUNTER — Ambulatory Visit (HOSPITAL_COMMUNITY): Payer: Medicare Other

## 2018-08-07 DIAGNOSIS — R932 Abnormal findings on diagnostic imaging of liver and biliary tract: Secondary | ICD-10-CM | POA: Diagnosis not present

## 2018-08-07 DIAGNOSIS — Z96642 Presence of left artificial hip joint: Secondary | ICD-10-CM | POA: Diagnosis not present

## 2018-08-07 DIAGNOSIS — K8050 Calculus of bile duct without cholangitis or cholecystitis without obstruction: Secondary | ICD-10-CM | POA: Insufficient documentation

## 2018-08-07 DIAGNOSIS — I10 Essential (primary) hypertension: Secondary | ICD-10-CM | POA: Insufficient documentation

## 2018-08-07 DIAGNOSIS — K831 Obstruction of bile duct: Secondary | ICD-10-CM

## 2018-08-07 HISTORY — PX: SPHINCTEROTOMY: SHX5544

## 2018-08-07 HISTORY — PX: ENDOSCOPIC RETROGRADE CHOLANGIOPANCREATOGRAPHY (ERCP) WITH PROPOFOL: SHX5810

## 2018-08-07 SURGERY — ENDOSCOPIC RETROGRADE CHOLANGIOPANCREATOGRAPHY (ERCP) WITH PROPOFOL
Anesthesia: General

## 2018-08-07 MED ORDER — ONDANSETRON HCL 4 MG/2ML IJ SOLN
INTRAMUSCULAR | Status: DC | PRN
  Administered 2018-08-07: 4 mg via INTRAVENOUS

## 2018-08-07 MED ORDER — FENTANYL CITRATE (PF) 100 MCG/2ML IJ SOLN
INTRAMUSCULAR | Status: DC | PRN
  Administered 2018-08-07 (×2): 50 ug via INTRAVENOUS

## 2018-08-07 MED ORDER — CIPROFLOXACIN IN D5W 400 MG/200ML IV SOLN
INTRAVENOUS | Status: AC
  Filled 2018-08-07: qty 200

## 2018-08-07 MED ORDER — SODIUM CHLORIDE 0.9 % IV SOLN
INTRAVENOUS | Status: DC | PRN
  Administered 2018-08-07: 30 mL

## 2018-08-07 MED ORDER — INDOMETHACIN 50 MG RE SUPP
RECTAL | Status: AC
  Filled 2018-08-07: qty 2

## 2018-08-07 MED ORDER — PHENYLEPHRINE 40 MCG/ML (10ML) SYRINGE FOR IV PUSH (FOR BLOOD PRESSURE SUPPORT)
PREFILLED_SYRINGE | INTRAVENOUS | Status: DC | PRN
  Administered 2018-08-07: 80 ug via INTRAVENOUS

## 2018-08-07 MED ORDER — PROPOFOL 10 MG/ML IV BOLUS
INTRAVENOUS | Status: AC
  Filled 2018-08-07: qty 40

## 2018-08-07 MED ORDER — GLUCAGON HCL RDNA (DIAGNOSTIC) 1 MG IJ SOLR
INTRAMUSCULAR | Status: AC
  Filled 2018-08-07: qty 1

## 2018-08-07 MED ORDER — ROCURONIUM BROMIDE 10 MG/ML (PF) SYRINGE
PREFILLED_SYRINGE | INTRAVENOUS | Status: DC | PRN
  Administered 2018-08-07: 50 mg via INTRAVENOUS

## 2018-08-07 MED ORDER — SODIUM CHLORIDE 0.9 % IV SOLN: INTRAVENOUS | Status: DC

## 2018-08-07 MED ORDER — LACTATED RINGERS IV SOLN
INTRAVENOUS | Status: DC
  Administered 2018-08-07: 11:00:00 via INTRAVENOUS

## 2018-08-07 MED ORDER — PROPOFOL 10 MG/ML IV BOLUS
INTRAVENOUS | Status: DC | PRN
  Administered 2018-08-07: 100 mg via INTRAVENOUS

## 2018-08-07 MED ORDER — DEXAMETHASONE SODIUM PHOSPHATE 10 MG/ML IJ SOLN
INTRAMUSCULAR | Status: DC | PRN
  Administered 2018-08-07: 10 mg via INTRAVENOUS

## 2018-08-07 MED ORDER — CIPROFLOXACIN IN D5W 400 MG/200ML IV SOLN
INTRAVENOUS | Status: DC | PRN
  Administered 2018-08-07: 400 mg via INTRAVENOUS

## 2018-08-07 MED ORDER — SUGAMMADEX SODIUM 200 MG/2ML IV SOLN
INTRAVENOUS | Status: DC | PRN
  Administered 2018-08-07: 350 mg via INTRAVENOUS

## 2018-08-07 MED ORDER — FENTANYL CITRATE (PF) 100 MCG/2ML IJ SOLN
INTRAMUSCULAR | Status: AC
  Filled 2018-08-07: qty 2

## 2018-08-07 MED ORDER — INDOMETHACIN 50 MG RE SUPP
RECTAL | Status: DC | PRN
  Administered 2018-08-07: 100 mg via RECTAL

## 2018-08-07 NOTE — H&P (Signed)
  [name] HPI: The patient was noted to have intermittent liver enzyme elevations.  Further work up showed that she has a distal CBD stone s/p lap chole.  She is here today for an ERCP with stone extraction.  Past Medical History:  Diagnosis Date  . Arthritis    left hip and back  . Bronchiectasis    isolated to RML; status post right middle lobe partial resection.  . Complication of anesthesia    hard to wake up  . Dyspnea   . Gall stones   . GERD (gastroesophageal reflux disease)    history   . H/O osteoporosis   . Hypertension   . Hypothyroidism   . Pneumonia   . Yeast infection     Past Surgical History:  Procedure Laterality Date  . ABDOMINAL HYSTERECTOMY  1974  . CHOLECYSTECTOMY N/A 07/02/2016   Procedure: LAPAROSCOPIC CHOLECYSTECTOMY;  Surgeon: Stark Klein, MD;  Location: [place];  Service: General;  Laterality: N/A;  . COLONOSCOPY    . EYE SURGERY     stye removed  . HERNIA REPAIR  1975  . KNEE SURGERY Left   . LUNG REMOVAL, PARTIAL  208   RML  . NM MYOVIEW LTD  06/2011   Normal EF. No ischemia or infarction.  Marland Kitchen TOTAL HIP ARTHROPLASTY Left 04/05/2017   Procedure: LEFT TOTAL HIP ARTHROPLASTY ANTERIOR APPROACH;  Surgeon: [name], MD;  Location: [place];  Service: Orthopedics;  Laterality: Left;  . TRANSTHORACIC ECHOCARDIOGRAM  06/2011   Mild concentric LVH. Normal EF with impaired relaxation. Mildly elevated RV pressures of 30 and 40 mmHg.  Marland Kitchen TRANSTHORACIC ECHOCARDIOGRAM  06/2016   normal LV size and function. EF 60-65% with GRD 2 DD. Mild aortic stenosis. Mild LA dilation. Mild to moderately increased PA pressures (46 mmHg).    Family History  Problem Relation Age of Onset  . Hypertension Mother   . CVA Mother 93  . Heart attack Sister   . Diabetes Sister   . Heart attack Brother   . Heart attack Brother     Social History:  reports that she has never smoked. She has never used smokeless tobacco. She reports that she does not drink alcohol or use  drugs.  Allergies:  Allergies  Allergen Reactions  . Ibuprofen Other (See Comments)    hallucinations  . Naproxen Sodium Other (See Comments)    hallucinations    Medications:  Scheduled:  Continuous: . sodium chloride    . lactated ringers 10 mL/hr at 08/07/18 1124    No results found for this or any previous visit (from the past 24 hour(s)).   No results found.  ROS:  As stated above in the HPI otherwise negative.  Blood pressure 136/70, pulse (!) 49, temperature 98.1 F (36.7 C), temperature source Oral, resp. rate 15, height 5\' 5"  (1.651 m), weight 87 kg, SpO2 100 %.    PE: Gen: NAD, Alert and Oriented HEENT:  [place]/AT, EOMI Neck: Supple, no LAD Lungs: CTA Bilaterally CV: RRR without M/G/R ABM: Soft, NTND, +BS Ext: No C/C/E  Assessment/Plan: 1) Choledocholithiasis - ERCP with stone extraction.  [name] D 08/07/2018, 11:26 AM

## 2018-08-07 NOTE — Anesthesia Postprocedure Evaluation (Signed)
Anesthesia Post Note  Patient: [name]  Procedure(s) Performed: ENDOSCOPIC RETROGRADE CHOLANGIOPANCREATOGRAPHY (ERCP) WITH PROPOFOL (N/A ) SPHINCTEROTOMY     Patient location during evaluation: PACU Anesthesia Type: General Level of consciousness: awake and alert Pain management: pain level controlled Vital Signs Assessment: post-procedure vital signs reviewed and stable Respiratory status: spontaneous breathing, nonlabored ventilation, respiratory function stable and patient connected to nasal cannula oxygen Cardiovascular status: blood pressure returned to baseline and stable Postop Assessment: no apparent nausea or vomiting Anesthetic complications: no    Last Vitals:  Vitals:   08/07/18 1300 08/07/18 1310  BP: (!) 163/91 113/81  Pulse: (!) 52 (!) 59  Resp: 18 13  Temp:    SpO2: 100% 99%    Last Pain:  Vitals:   08/07/18 1310  TempSrc:   PainSc: 0-No pain                 Ismael Karge S

## 2018-08-07 NOTE — Discharge Instructions (Signed)

## 2018-08-07 NOTE — Transfer of Care (Signed)
Immediate Anesthesia Transfer of Care Note  Patient: [name]  Procedure(s) Performed: Procedure(s): ENDOSCOPIC RETROGRADE CHOLANGIOPANCREATOGRAPHY (ERCP) WITH PROPOFOL (N/A)  Patient Location: PACU  Anesthesia Type:General  Level of Consciousness:  sedated, patient cooperative and responds to stimulation  Airway & Oxygen Therapy:Patient Spontanous Breathing and Patient connected to face mask oxgen  Post-op Assessment:  Report given to PACU RN and Post -op Vital signs reviewed and stable  Post vital signs:  Reviewed and stable  Last Vitals:  Vitals:   08/07/18 1107 08/07/18 1233  BP: 136/70 (!) 153/73  Pulse: (!) 49 (!) 55  Resp: 15 12  Temp: 36.7 C   SpO2: 381% 829%    Complications: No apparent anesthesia complications

## 2018-08-07 NOTE — Anesthesia Procedure Notes (Signed)
Procedure Name: Intubation Date/Time: 08/07/2018 11:42 AM Performed by: Anne Fu, CRNA Pre-anesthesia Checklist: Patient identified, Emergency Drugs available, Suction available, Patient being monitored and Timeout performed Patient Re-evaluated:Patient Re-evaluated prior to induction Oxygen Delivery Method: Circle system utilized Preoxygenation: Pre-oxygenation with 100% oxygen Induction Type: IV induction Ventilation: Mask ventilation without difficulty Laryngoscope Size: Mac and 4 Grade View: Grade I Tube type: Oral Tube size: 7.5 mm Number of attempts: 1 Airway Equipment and Method: Stylet Placement Confirmation: ETT inserted through vocal cords under direct vision,  positive ETCO2 and breath sounds checked- equal and bilateral Secured at: 21 cm Tube secured with: Tape Dental Injury: Teeth and Oropharynx as per pre-operative assessment

## 2018-08-07 NOTE — Anesthesia Preprocedure Evaluation (Addendum)
Anesthesia Evaluation  Patient identified by MRN, date of birth, ID band Patient awake    Reviewed: Allergy & Precautions, NPO status , Patient's Chart, lab work & pertinent test results  Airway Mallampati: II  TM Distance: >3 FB Neck ROM: Full    Dental no notable dental hx.    Pulmonary neg pulmonary ROS,    Pulmonary exam normal breath sounds clear to auscultation       Cardiovascular hypertension, Normal cardiovascular exam Rhythm:Regular Rate:Normal  2017  Left ventricle: The cavity size was normal. Systolic function was   normal. The estimated ejection fraction was in the range of 60%   to 65%. Wall motion was normal; there were no regional wall   motion abnormalities. Features are consistent with a pseudonormal   left ventricular filling pattern, with concomitant abnormal   relaxation and increased filling pressure (grade 2 diastolic   dysfunction). - Aortic valve: There was very mild stenosis. Valve area (VTI):   2.01 cm^2. Valve area (Vmax): 2.09 cm^2. Valve area (Vmean): 1.96   cm^2. - Mitral valve: Calcified annulus. - Left atrium: The atrium was mildly dilated. - Pulmonary arteries: Systolic pressure was mildly to moderately   increased. PA peak pressure: 46 mm Hg (S).   Neuro/Psych negative neurological ROS  negative psych ROS   GI/Hepatic negative GI ROS, Neg liver ROS,   Endo/Other  negative endocrine ROS  Renal/GU negative Renal ROS  negative genitourinary   Musculoskeletal negative musculoskeletal ROS (+)   Abdominal   Peds negative pediatric ROS (+)  Hematology negative hematology ROS (+)   Anesthesia Other Findings   Reproductive/Obstetrics negative OB ROS                            Anesthesia Physical Anesthesia Plan  ASA: III  Anesthesia Plan: General   Post-op Pain Management:    Induction: Intravenous  PONV Risk Score and Plan: 2 and Ondansetron,  Dexamethasone and Treatment may vary due to age or medical condition  Airway Management Planned: Oral ETT  Additional Equipment:   Intra-op Plan:   Post-operative Plan: Extubation in OR  Informed Consent: I have reviewed the patients History and Physical, chart, labs and discussed the procedure including the risks, benefits and alternatives for the proposed anesthesia with the patient or authorized representative who has indicated his/her understanding and acceptance.   Dental advisory given  Plan Discussed with: CRNA and Surgeon  Anesthesia Plan Comments:         Anesthesia Quick Evaluation

## 2018-08-07 NOTE — Op Note (Addendum)
[facility] Patient Name: [name] Procedure Date: 08/07/2018 MRN: [id number] Attending MD: Carol Ada , MD Date of Birth: Jan 06, 1933 CSN: [id number] Age: 84 Admit Type: Outpatient Procedure:                ERCP Indications:              Common bile duct stone(s) Providers:                Carol Ada, MD, Cleda Daub, RN, Elspeth Cho                            Tech., Technician, Anne Fu CRNA, CRNA Referring MD:              Medicines:                General Anesthesia Complications:            No immediate complications. Estimated Blood Loss:     Estimated blood loss was minimal. Procedure:                Pre-Anesthesia Assessment:                           - Prior to the procedure, a History and Physical                            was performed, and patient medications and                            allergies were reviewed. The patient's tolerance of                            previous anesthesia was also reviewed. The risks                            and benefits of the procedure and the sedation                            options and risks were discussed with the patient.                            All questions were answered, and informed consent                            was obtained. Prior Anticoagulants: The patient has                            taken no previous anticoagulant or antiplatelet                            agents. ASA Grade Assessment: II - A patient with                            mild systemic disease. After reviewing the risks  and benefits, the patient was deemed in                            satisfactory condition to undergo the procedure.                           - Sedation was administered by an anesthesia                            professional. General anesthesia was attained.                           After obtaining informed consent, the scope was                            passed  under direct vision. Throughout the                            procedure, the patient's blood pressure, pulse, and                            oxygen saturations were monitored continuously. The                            [id number]-[id number] ([id number]) Olympus ERCP was introduced                            through the mouth, and used to inject contrast into                            and used to inject contrast into the bile duct. The                            ERCP was accomplished without difficulty. The                            patient tolerated the procedure well. Scope In: Scope Out: Findings:      The major papilla was normal. The bile duct was deeply cannulated with       the short-nosed traction sphincterotome. Contrast was injected. I       personally interpreted the bile duct images. There was brisk flow of       contrast through the ducts. Image quality was excellent. Contrast       extended to the hepatic ducts. The main bile duct was normal. A long       0.035 inch Soft Jagwire was passed into the biliary tree. A 10 mm       biliary sphincterotomy was made with a monofilament traction (standard)       sphincterotome using ERBE electrocautery. There was no       post-sphincterotomy bleeding. The biliary tree was swept with a 12 mm       balloon starting at the bifurcation. Nothing was found.      Cannulation was easily achieved during the first attempt. The guidewire       was secured in the  right intrahpatic ducts. Contrast injection showed a       minor dilation of the CBD and some dilated ducts at the bifurcation. The       CBD meassured 8-10 mm. There was no clear evidence of a filling defect       in the CBD. A 1 cm sphincterotomy was created and brisk flow of       bile/contrast escaped the CBD. A 12 mm balloon was used to sweep the CBD       from the bifurcation 3 times. It was not clear if a small stone/debris       was extracted from the CBD. The subsequent sweeps did not  reveal any       stones or debris. The occlusion cholangiogram was negative for any       filling defects. A final 5th sweep was performed after the occlusion       cholangiogram. Impression:               - The major papilla appeared normal.                           - A biliary sphincterotomy was performed.                           - The biliary tree was swept and nothing was found. Moderate Sedation:      Not Applicable - Patient had care per Anesthesia. Recommendation:           - Patient has a contact number available for                            emergencies. The signs and symptoms of potential                            delayed complications were discussed with the                            patient. Return to normal activities tomorrow.                            Written discharge instructions were provided to the                            patient.                           - Resume previous diet.                           - Check liver panel in 3-4 days. Procedure Code(s):        --- Professional ---                           [phone], Endoscopic retrograde                            cholangiopancreatography (ERCP); with  sphincterotomy/papillotomy                           [id number], Endoscopic catheterization of the biliary                            ductal system, radiological supervision and                            interpretation Diagnosis Code(s):        --- Professional ---                           K80.50, Calculus of bile duct without cholangitis                            or cholecystitis without obstruction CPT copyright 2018 [employer]. All rights reserved. The codes documented in this report are preliminary and upon coder review may  be revised to meet current compliance requirements. Carol Ada, MD Carol Ada, MD 08/07/2018 67:54:49 PM This report has been signed electronically. Number of Addenda: 0

## 2018-08-11 ENCOUNTER — Encounter: Payer: Self-pay | Admitting: Internal Medicine

## 2018-08-11 DIAGNOSIS — R740 Nonspecific elevation of levels of transaminase and lactic acid dehydrogenase [LDH]: Secondary | ICD-10-CM | POA: Diagnosis not present

## 2018-08-13 ENCOUNTER — Encounter: Payer: Self-pay | Admitting: Internal Medicine

## 2018-08-13 ENCOUNTER — Ambulatory Visit (INDEPENDENT_AMBULATORY_CARE_PROVIDER_SITE_OTHER): Payer: Medicare Other | Admitting: Internal Medicine

## 2018-08-13 VITALS — BP 130/66 | HR 56 | Temp 97.6°F | Ht 65.0 in | Wt 190.2 lb

## 2018-08-13 DIAGNOSIS — Z7982 Long term (current) use of aspirin: Secondary | ICD-10-CM

## 2018-08-13 DIAGNOSIS — R945 Abnormal results of liver function studies: Secondary | ICD-10-CM

## 2018-08-13 DIAGNOSIS — I10 Essential (primary) hypertension: Secondary | ICD-10-CM

## 2018-08-13 DIAGNOSIS — R7989 Other specified abnormal findings of blood chemistry: Secondary | ICD-10-CM

## 2018-08-13 DIAGNOSIS — K8050 Calculus of bile duct without cholangitis or cholecystitis without obstruction: Secondary | ICD-10-CM

## 2018-08-14 LAB — HEPATIC FUNCTION PANEL
ALT: 27 IU/L (ref 0–32)
AST: 22 IU/L (ref 0–40)
Albumin: 3.8 g/dL (ref 3.5–4.7)
Alkaline Phosphatase: 128 IU/L — ABNORMAL HIGH (ref 39–117)
Bilirubin Total: 0.6 mg/dL (ref 0.0–1.2)
Bilirubin, Direct: 0.2 mg/dL (ref 0.00–0.40)
Total Protein: 7 g/dL (ref 6.0–8.5)

## 2018-08-15 NOTE — Progress Notes (Signed)
Liver enzymes are now normal. Please also fax copy to Dr. Benson Norway and Dr. Chalmers Cater.

## 2018-08-19 NOTE — Progress Notes (Signed)
Subjective:     Patient ID: [name] , female    DOB: [birth year] , 84 y.o.   MRN: [id number]   Chief Complaint  Patient presents with  . Hypertension    HPI  Hypertension  This is a chronic problem. The current episode started more than 1 year ago. The problem has been gradually improving since onset. The problem is controlled. Pertinent negatives include no blurred vision, chest pain, headaches, orthopnea, palpitations or shortness of breath. Risk factors for coronary artery disease include obesity, post-menopausal state and sedentary lifestyle. The current treatment provides moderate improvement.     Past Medical History:  Diagnosis Date  . Arthritis    left hip and back  . Bronchiectasis    isolated to RML; status post right middle lobe partial resection.  . Complication of anesthesia    hard to wake up  . Dyspnea   . Gall stones   . GERD (gastroesophageal reflux disease)    history   . H/O osteoporosis   . Hypertension   . Hypothyroidism   . Pneumonia   . Yeast infection      Family History  Problem Relation Age of Onset  . Hypertension Mother   . CVA Mother 57  . Heart attack Sister   . Diabetes Sister   . Heart attack Brother   . Heart attack Brother      Current Outpatient Medications:  .  aspirin EC 81 MG tablet, Take 81 mg by mouth daily., Disp: , Rfl:  .  CALCIUM-MAGNESUIUM-ZINC 333-133-8.3 MG TABS, Take 1 tablet by mouth daily., Disp: , Rfl:  .  cholecalciferol (VITAMIN D) 1000 UNITS tablet, Take 1,000 Units by mouth daily., Disp: , Rfl:  .  diclofenac sodium (VOLTAREN) 1 % GEL, APPLY  2-4 GRAMS TO EACH KNEE  TOPICALLY UP TO  4 TIMES DAILY (Patient taking differently: Apply 1 application topically 3 (three) times daily as needed. ), Disp: 2 Tube, Rfl: 1 .  Flaxseed, Linseed, (FLAX SEED OIL PO), Take 1 capsule by mouth daily. , Disp: , Rfl:  .  furosemide (LASIX) 20 MG tablet, Take 20 mg by mouth daily.  , Disp: , Rfl:  .  metoprolol succinate  (TOPROL-XL) 25 MG 24 hr tablet, Take 25 mg by mouth daily. , Disp: , Rfl:  .  nystatin cream (MYCOSTATIN), Apply 1 application topically daily as needed for dry skin. Apply under breasts and on stomach folds, Disp: , Rfl:  .  potassium chloride SA (K-DUR,KLOR-CON) 20 MEQ tablet, Take 20 mEq by mouth daily. , Disp: , Rfl:  .  vitamin C (ASCORBIC ACID) 500 MG tablet, Take 500 mg by mouth daily., Disp: , Rfl:    Allergies  Allergen Reactions  . Ibuprofen Other (See Comments)    hallucinations  . Naproxen Sodium Other (See Comments)    hallucinations     Review of Systems  Constitutional: Negative.   Eyes: Negative for blurred vision.  Respiratory: Negative.  Negative for shortness of breath.   Cardiovascular: Negative.  Negative for chest pain, palpitations and orthopnea.  Neurological: Negative.  Negative for headaches.  Psychiatric/Behavioral: Negative.      Today's Vitals   08/13/18 1052  BP: 130/66  Pulse: (!) 56  Temp: 97.6 F (36.4 C)  TempSrc: Oral  Weight: 190 lb 3.2 oz (86.3 kg)  Height: 5\' 5"  (1.651 m)  PainSc: 6   PainLoc: Hip   Body mass index is 31.65 kg/m.   Objective:  Physical Exam  Constitutional: She is oriented to person, place, and time. She appears well-developed and well-nourished.  HENT:  Head: Normocephalic and atraumatic.  Cardiovascular: Normal rate, regular rhythm and normal heart sounds.  Pulmonary/Chest: Effort normal and breath sounds normal.  Neurological: She is alert and oriented to person, place, and time.  Nursing note and vitals reviewed.       Assessment And Plan:     1. Essential hypertension, benign  Well controlled. She will continue with current meds. She is encouraged to avoid adding salt to her foods.   2. Elevated LFTs  I will check repeat LFTs today.   - Liver Profile  3. Bile duct calculus without cholecystitis and no obstruction  She is s/p ERCP performed by Dr. Benson Norway. CBD stone was found on exam and this  appears to be the cause of her elevated LFTs. Again, I will check repeat levels today.   Maximino Greenland, MD

## 2018-08-21 ENCOUNTER — Encounter: Payer: Self-pay | Admitting: Surgery

## 2018-08-21 DIAGNOSIS — E049 Nontoxic goiter, unspecified: Secondary | ICD-10-CM | POA: Diagnosis not present

## 2018-08-21 DIAGNOSIS — R748 Abnormal levels of other serum enzymes: Secondary | ICD-10-CM | POA: Diagnosis not present

## 2018-08-21 DIAGNOSIS — E0590 Thyrotoxicosis, unspecified without thyrotoxic crisis or storm: Secondary | ICD-10-CM | POA: Diagnosis not present

## 2018-08-26 DIAGNOSIS — I10 Essential (primary) hypertension: Secondary | ICD-10-CM | POA: Diagnosis not present

## 2018-08-26 DIAGNOSIS — R748 Abnormal levels of other serum enzymes: Secondary | ICD-10-CM | POA: Diagnosis not present

## 2018-08-26 DIAGNOSIS — E049 Nontoxic goiter, unspecified: Secondary | ICD-10-CM | POA: Diagnosis not present

## 2018-08-26 DIAGNOSIS — E7800 Pure hypercholesterolemia, unspecified: Secondary | ICD-10-CM | POA: Diagnosis not present

## 2018-08-26 DIAGNOSIS — E0590 Thyrotoxicosis, unspecified without thyrotoxic crisis or storm: Secondary | ICD-10-CM | POA: Diagnosis not present

## 2018-09-01 ENCOUNTER — Encounter (INDEPENDENT_AMBULATORY_CARE_PROVIDER_SITE_OTHER): Payer: Self-pay | Admitting: Orthopedic Surgery

## 2018-09-01 ENCOUNTER — Ambulatory Visit (INDEPENDENT_AMBULATORY_CARE_PROVIDER_SITE_OTHER): Payer: Medicare Other | Admitting: Orthopedic Surgery

## 2018-09-01 ENCOUNTER — Ambulatory Visit (INDEPENDENT_AMBULATORY_CARE_PROVIDER_SITE_OTHER): Payer: Self-pay

## 2018-09-01 VITALS — Ht 65.0 in | Wt 190.2 lb

## 2018-09-01 DIAGNOSIS — M25562 Pain in left knee: Secondary | ICD-10-CM

## 2018-09-01 DIAGNOSIS — G8929 Other chronic pain: Secondary | ICD-10-CM

## 2018-09-01 DIAGNOSIS — M25561 Pain in right knee: Secondary | ICD-10-CM | POA: Diagnosis not present

## 2018-09-01 NOTE — Progress Notes (Signed)
Office Visit Note   Patient: [name]           Date of Birth: Jan 12, 1933           MRN: [id number] Visit Date: 09/01/2018              Requested by: Glendale Chard, [address] [place], [place] [address] PCP: Glendale Chard, MD  Chief Complaint  Patient presents with  . Left Knee - Pain      HPI: Patient is an 84 year old woman with osteoarthritis involving her hips and knees.  Patient states she has had increasing pain which started yesterday which was acute she states she has difficulty with weightbearing she states the pain is a 9 out of 10 she states she has increased swelling denies any specific trauma.  Assessment & Plan: Visit Diagnoses:  1. Chronic pain of both knees     Plan: Both knees were injected she tolerated this well she will follow-up as scheduled with Dr. Lorin Mercy for her hip arthritis.  Follow-Up Instructions: Return if symptoms worsen or fail to improve.   Ortho Exam  Patient is alert, oriented, no adenopathy, well-dressed, normal affect, normal respiratory effort. Examination patient is ambulating in a wheelchair she normally uses a cane.  She has a small amount of effusion of both knees with venous stasis changes in both legs.  Collateral ligaments are stable bilaterally she has flexion of the right knee to 100 degrees flexion of 45 degrees on the left knee.  She is globally tender to palpation around both knees.  Imaging: Xr Knee 1-2 Views Left  Result Date: 09/01/2018 2 view radiographs of the left knee shows severe tricompartmental arthritis with essentially bone-on-bone contact of all compartments subcondylar cysts and sclerosis with periarticular bony spurs in all 3 compartments.  Patient does have 2 pins status post open reduction internal fixation of a tibial plateau fracture.  No images are attached to the encounter.  Labs: No results found for: HGBA1C, ESRSEDRATE, CRP, LABURIC, REPTSTATUS, GRAMSTAIN, CULT,  LABORGA   Lab Results  Component Value Date   ALBUMIN 3.8 08/13/2018   ALBUMIN 3.9 07/30/2018   ALBUMIN 3.6 07/15/2018    Body mass index is 31.65 kg/m.  Orders:  Orders Placed This Encounter  Procedures  . XR Knee 1-2 Views Left   No orders of the defined types were placed in this encounter.    Procedures: Large Joint Inj: bilateral knee on 09/01/2018 11:36 AM Indications: pain and diagnostic evaluation Details: 22 G 1.5 in needle, anteromedial approach  Arthrogram: No  Outcome: tolerated well, no immediate complications Procedure, treatment alternatives, risks and benefits explained, specific risks discussed. Consent was given by the patient. Immediately prior to procedure a time out was called to verify the correct patient, procedure, equipment, support staff and site/side marked as required. Patient was prepped and draped in the usual sterile fashion.      Clinical Data: No additional findings.  ROS:  All other systems negative, except as noted in the HPI. Review of Systems  Objective: Vital Signs: Ht 5\' 5"  (1.651 m)   Wt 190 lb 3.2 oz (86.3 kg)   BMI 31.65 kg/m   Specialty Comments:  No specialty comments available.  PMFS History: Patient Active Problem List   Diagnosis Date Noted  . H/O total hip arthroplasty, left 11/07/2017  . Post-traumatic osteoarthritis of left knee 11/07/2017  . Unilateral primary osteoarthritis, right knee 11/07/2017  . Unilateral primary osteoarthritis, right hip 08/06/2017  .  Presence of left artificial hip joint 05/14/2017  . Preop cardiovascular exam 03/12/2017  . Cough   . Abdominal pain   . Symptomatic cholelithiasis 07/01/2016  . Nonspecific ST-T wave electrocardiographic changes   . Lower leg edema 02/16/2015  . Obesity (BMI 30.0-34.9) 02/16/2015  . Osteoporosis 01/11/2012  . Essential hypertension 01/11/2012  . Hyperthyroidism 01/11/2012  . Hypercholesteremia 01/11/2012  . H/O vaginal hysterectomy 1974  01/11/2012    Class: History of  . S/P removal of lung  partial R lobe  2010 01/11/2012  . History of hernia repair  R ing. 1975 01/11/2012  . BACK PAIN 11/12/2007  . DYSPNEA 11/11/2007   Past Medical History:  Diagnosis Date  . Arthritis    left hip and back  . Bronchiectasis    isolated to RML; status post right middle lobe partial resection.  . Complication of anesthesia    hard to wake up  . Dyspnea   . Gall stones   . GERD (gastroesophageal reflux disease)    history   . H/O osteoporosis   . Hypertension   . Hypothyroidism   . Pneumonia   . Yeast infection     Family History  Problem Relation Age of Onset  . Hypertension Mother   . CVA Mother 51  . Heart attack Sister   . Diabetes Sister   . Heart attack Brother   . Heart attack Brother     Past Surgical History:  Procedure Laterality Date  . ABDOMINAL HYSTERECTOMY  1974  . CHOLECYSTECTOMY N/A 07/02/2016   Procedure: LAPAROSCOPIC CHOLECYSTECTOMY;  Surgeon: Stark Klein, MD;  Location: [facility];  Service: General;  Laterality: N/A;  . COLONOSCOPY    . ENDOSCOPIC RETROGRADE CHOLANGIOPANCREATOGRAPHY (ERCP) WITH PROPOFOL N/A 08/07/2018   Procedure: ENDOSCOPIC RETROGRADE CHOLANGIOPANCREATOGRAPHY (ERCP) WITH PROPOFOL;  Surgeon: Carol Ada, MD;  Location[facility];  Service: Endoscopy;  Laterality: N/A;  . EYE SURGERY     stye removed  . HERNIA REPAIR  1975  . KNEE SURGERY Left   . LUNG REMOVAL, PARTIAL  208   RML  . NM MYOVIEW LTD  06/2011   Normal EF. No ischemia or infarction.  [name]  08/07/2018   Procedure: [name];  Surgeon: Carol Ada, MD;  [facility];  Service: Endoscopy;;  balloon sweep  . TOTAL HIP ARTHROPLASTY Left 04/05/2017   Procedure: LEFT TOTAL HIP ARTHROPLASTY ANTERIOR APPROACH;  Surgeon: [name], MD;  Location: [facility];  Service: Orthopedics;  Laterality: Left;  . TRANSTHORACIC ECHOCARDIOGRAM  06/2011   Mild concentric LVH. Normal EF with impaired relaxation.  Mildly elevated RV pressures of 30 and 40 mmHg.  Marland Kitchen TRANSTHORACIC ECHOCARDIOGRAM  06/2016   normal LV size and function. EF 60-65% with GRD 2 DD. Mild aortic stenosis. Mild LA dilation. Mild to moderately increased PA pressures (46 mmHg).   Social History   Occupational History  . Not on file  Tobacco Use  . Smoking status: Never Smoker  . Smokeless tobacco: Never Used  Substance and Sexual Activity  . Alcohol use: No  . Drug use: No  . Sexual activity: Never    Birth control/protection: Abstinence

## 2018-09-08 NOTE — Pre-Procedure Instructions (Signed)
[name]  09/08/2018      [facility] ([facility] [place] - [place] [address] [place] ([place]) [place] [address] Phone: [phone] Fax: [phone]  [facility] - [address], [address] [place] [address] Phone: [phone] Fax: [phone]    Your procedure is scheduled on Fri., Dec. 13, 2019 from 7:30AM-9:55AM  Report to [facility] Admitting Entrance "A" at 5:30AM  Call this number if you have problems the morning of surgery:  [phone]   Remember:  Do not eat or drink after midnight on Dec. 12th    Take these medicines the morning of surgery with A SIP OF WATER: Methimazole (TAPAZOLE) and Metoprolol succinate (TOPROL-XL)  7 days before surgery (09/12/18), stop taking all Other Aspirin Products, Vitamins, Fish oils, and Herbal medications. Also stop all NSAIDS i.e. Advil, Ibuprofen, Motrin, Aleve, Anaprox, Naproxen, BC, Goody Powders, and all Supplements.    Do not wear jewelry, make-up or nail polish.  Do not wear lotions, powders, or perfumes, or deodorant.  Do not shave 48 hours prior to surgery.  Do not bring valuables to the hospital.  [facility] is not responsible for any belongings or valuables.  Contacts, dentures or bridgework may not be worn into surgery.  Leave your suitcase in the car.  After surgery it may be brought to your room.  For patients admitted to the hospital, discharge time will be determined by your treatment team.  Patients discharged the day of surgery will not be allowed to drive home.   Special instructions:  [facility]- Preparing For Surgery  Before surgery, you can play an important role. Because skin is not sterile, your skin needs to be as free of germs as possible. You can reduce the number of germs on your skin by washing with CHG (chlorahexidine gluconate) Soap before surgery.  CHG is an antiseptic cleaner which kills germs  and bonds with the skin to continue killing germs even after washing.    Oral Hygiene is also important to reduce your risk of infection.  Remember - BRUSH YOUR TEETH THE MORNING OF SURGERY WITH YOUR REGULAR TOOTHPASTE  Please do not use if you have an allergy to CHG or antibacterial soaps. If your skin becomes reddened/irritated stop using the CHG.  Do not shave (including legs and underarms) for at least 48 hours prior to first CHG shower. It is OK to shave your face.  Please follow these instructions carefully.   1. Shower the NIGHT BEFORE SURGERY and the MORNING OF SURGERY with CHG.   2. If you chose to wash your hair, wash your hair first as usual with your normal shampoo.  3. After you shampoo, rinse your hair and body thoroughly to remove the shampoo.  4. Use CHG as you would any other liquid soap. You can apply CHG directly to the skin and wash gently with a scrungie or a clean washcloth.   5. Apply the CHG Soap to your body ONLY FROM THE NECK DOWN.  Do not use on open wounds or open sores. Avoid contact with your eyes, ears, mouth and genitals (private parts). Wash Face and genitals (private parts)  with your normal soap.  6. Wash thoroughly, paying special attention to the area where your surgery will be performed.  7. Thoroughly rinse your body with warm water from the neck down.  8. DO NOT shower/wash with  your normal soap after using and rinsing off the CHG Soap.  9. Pat yourself dry with a CLEAN TOWEL.  10. Wear CLEAN PAJAMAS to bed the night before surgery, wear comfortable clothes the morning of surgery  11. Place CLEAN SHEETS on your bed the night of your first shower and DO NOT SLEEP WITH PETS.  Day of Surgery:  Do not apply any deodorants/lotions.  Please wear clean clothes to the hospital/surgery center.   Remember to brush your teeth WITH YOUR REGULAR TOOTHPASTE.  Please read over the following fact sheets that you were given. Pain Booklet, Coughing and Deep  Breathing, MRSA Information and Surgical Site Infection Prevention

## 2018-09-09 ENCOUNTER — Other Ambulatory Visit: Payer: Self-pay

## 2018-09-09 ENCOUNTER — Encounter (HOSPITAL_COMMUNITY): Payer: Self-pay

## 2018-09-09 ENCOUNTER — Encounter (HOSPITAL_COMMUNITY)
Admission: RE | Admit: 2018-09-09 | Discharge: 2018-09-09 | Disposition: A | Discharge status: Home / Self Care | Payer: Medicare Other | Source: Ambulatory Visit | Attending: Orthopaedic Surgery | Admitting: Orthopaedic Surgery

## 2018-09-09 DIAGNOSIS — E669 Obesity, unspecified: Secondary | ICD-10-CM | POA: Diagnosis not present

## 2018-09-09 DIAGNOSIS — Z01812 Encounter for preprocedural laboratory examination: Secondary | ICD-10-CM | POA: Diagnosis not present

## 2018-09-09 DIAGNOSIS — E049 Nontoxic goiter, unspecified: Secondary | ICD-10-CM | POA: Diagnosis not present

## 2018-09-09 DIAGNOSIS — E0590 Thyrotoxicosis, unspecified without thyrotoxic crisis or storm: Secondary | ICD-10-CM | POA: Diagnosis not present

## 2018-09-09 DIAGNOSIS — Z6830 Body mass index (BMI) 30.0-30.9, adult: Secondary | ICD-10-CM | POA: Insufficient documentation

## 2018-09-09 DIAGNOSIS — M1611 Unilateral primary osteoarthritis, right hip: Secondary | ICD-10-CM | POA: Diagnosis not present

## 2018-09-09 DIAGNOSIS — Z79899 Other long term (current) drug therapy: Secondary | ICD-10-CM | POA: Insufficient documentation

## 2018-09-09 DIAGNOSIS — K219 Gastro-esophageal reflux disease without esophagitis: Secondary | ICD-10-CM | POA: Insufficient documentation

## 2018-09-09 DIAGNOSIS — I10 Essential (primary) hypertension: Secondary | ICD-10-CM | POA: Diagnosis not present

## 2018-09-09 DIAGNOSIS — R748 Abnormal levels of other serum enzymes: Secondary | ICD-10-CM | POA: Diagnosis not present

## 2018-09-09 HISTORY — DX: Thyrotoxicosis, unspecified without thyrotoxic crisis or storm: E05.90

## 2018-09-09 LAB — COMPREHENSIVE METABOLIC PANEL
ALT: 14 U/L (ref 0–44)
AST: 20 U/L (ref 15–41)
Albumin: 3.4 g/dL — ABNORMAL LOW (ref 3.5–5.0)
Alkaline Phosphatase: 72 U/L (ref 38–126)
Anion gap: 9 (ref 5–15)
BUN: 14 mg/dL (ref 8–23)
CO2: 23 mmol/L (ref 22–32)
Calcium: 9 mg/dL (ref 8.9–10.3)
Chloride: 106 mmol/L (ref 98–111)
Creatinine, Ser: 0.72 mg/dL (ref 0.44–1.00)
GFR calc Af Amer: >60 mL/min (ref >60)
GFR calc non Af Amer: >60 mL/min (ref >60)
Glucose, Bld: 92 mg/dL (ref 70–99)
Potassium: 4 mmol/L (ref 3.5–5.1)
Sodium: 138 mmol/L (ref 135–145)
Total Bilirubin: 1.1 mg/dL (ref 0.3–1.2)
Total Protein: 7.5 g/dL (ref 6.5–8.1)

## 2018-09-09 LAB — CBC
HCT: 39.4 % (ref 36.0–46.0)
Hemoglobin: 13.5 g/dL (ref 12.0–15.0)
MCH: 27.3 pg (ref 26.0–34.0)
MCHC: 34.3 g/dL (ref 30.0–36.0)
MCV: 79.8 fL — ABNORMAL LOW (ref 80.0–100.0)
Platelets: 339 10*3/uL (ref 150–400)
RBC: 4.94 MIL/uL (ref 3.87–5.11)
RDW: 13.5 % (ref 11.5–15.5)
WBC: 6.2 10*3/uL (ref 4.0–10.5)
nRBC: 0 % (ref 0.0–0.2)

## 2018-09-09 LAB — SURGICAL PCR SCREEN
MRSA, PCR: NEGATIVE
Staphylococcus aureus: NEGATIVE

## 2018-09-09 NOTE — Progress Notes (Signed)
PCP - Dr. Glendale Chard  Cardiologist - Dr. Ellyn Hack- cardiac clearance by Jory Sims , NP 07/28/18  Chest x-ray - 07/15/18 (E)  EKG - 07/28/18 (E)  Stress Test - 06/2011 (E)   ECHO - 07/05/16 (E)  Cardiac Cath - Denies  AICD- na PM- na LOOP- na  Sleep Study - Denies CPAP - None  LABS- 09/09/18: CBC, CMP, PCR  ASA- Denies   Anesthesia- Yes- medical history- pt's previous surgery was cancelled due to abnormal LFT's.   Pt denies having chest pain, sob, or fever at this time. All instructions explained to the pt, with a verbal understanding of the material. Pt agrees to go over the instructions while at home for a better understanding. The opportunity to ask questions was provided.

## 2018-09-10 ENCOUNTER — Encounter (HOSPITAL_COMMUNITY): Payer: Self-pay

## 2018-09-10 ENCOUNTER — Encounter (HOSPITAL_COMMUNITY): Payer: Self-pay | Admitting: Vascular Surgery

## 2018-09-10 ENCOUNTER — Encounter (HOSPITAL_COMMUNITY): Payer: Self-pay | Admitting: Anesthesiology

## 2018-09-10 NOTE — Progress Notes (Signed)
Anesthesia Chart Review:  Case:  [id number] Date/Time:  09/19/18 0715   Procedure:  RIGHT TOTAL HIP ARTHROPLASTY-DIRECT ANTERIOR (Right )   Anesthesia type:  General   Pre-op diagnosis:  RIGHT HIP OSTEOARTHRITIS   Location:  [facility] OR ROOM 06 / [facility] OR   Surgeon:  [name], MD      DISCUSSION: Patient is an 84 year old female scheduled for the above procedure. Surgery was initially scheduled for 07/16/18, but was postponed to allow time for GI evaluation due to elevated LFTs. She underwent ERCP with bilary sphincterotomy 08/07/18 with normalization of LFTs.  History includes never smoker, bronchiectasis (isolated to RML, s/p RM lobectomy 11/28/06), HTN, hyperthyroidism (on Tapazole), GERD, dyspnea. BMI is consistent with mild obesity. Reports she is "hard to wake up" after anesthesia.  She had preoperative cardiology evaluation on 07/28/18 by Jory Sims, NP. She wrote, "...based on ACC/AHA guidelines, [name] would be at acceptable risk for the planned procedure without further cardiovascular testing." Of note, per 03/12/17 note by Dr. Ellyn Hack, patient has known "chronic borderline inferior ST elevations on her EKGS that are clearly not associated with acute coronary syndrome. Normal echocardiogram."  Patient is seeing endocrinologist Dr. Chalmers Cater for non-toxic goiter and hyperthyroidism. Apparently, patient unable to take Tapazole when LFTs were elevated, but she also did not want to undergo RAI at that time. However, according to 08/26/18 addendum (scanned under Media tab), Dr. Chalmers Cater notes that patient was able to restart methimazole (LFTs normalized) in preparation for surgery and will follow-up after her recovery and reconsider RAI treatment.    Based on currently available information, I would anticipate that she can proceed as planned if no acute changes.   VS: BP 133/68   Pulse (!) 57   Temp (!) 36.3 C   Resp 20   Ht 5\' 5"  (1.651 m)   Wt 84.3 kg   SpO2 97%   BMI 30.94 kg/m      PROVIDERS: Glendale Chard, MD is PCP. Last visit 08/13/18. Carol Ada, MD is GI. - Glenetta Hew, MD is cardiologist. Last visit with Jory Sims, NP on 07/28/18 for follow-up of chronic chest pain felt to be musculoskeletal. She was felt acceptable risk for surgery.  - Jacelyn Pi, MD is endocrinologist. - Vaughan Browner, Praveen is pulmonologist. Last visit 01/03/16.   LABS: Labs reviewed: Acceptable for surgery. (all labs ordered are listed, but only abnormal results are displayed)  Labs Reviewed  COMPREHENSIVE METABOLIC PANEL - Abnormal; Notable for the following components:      Result Value   Albumin 3.4 (*)    All other components within normal limits  CBC - Abnormal; Notable for the following components:   MCV 79.8 (*)    All other components within normal limits  SURGICAL PCR SCREEN    IMAGES: CXR 07/15/18: IMPRESSION: No active cardiopulmonary disease.   EKG: 07/28/18 ([facility]): SR with PACs. ST elevation, consider early repolarization, pericarditis, or injury. Non-specific T wave abnormality. Patient with known chronic ST elevations, primarily in inferolateral leads. She has reverse r wave progression in V1-2, V2-3 and T wave in V1 is upright and negative V2-3 when compared with 07/15/18 tracing, so question precordial lead placement.    CV: Echo 07/05/16: Study Conclusions - Left ventricle: The cavity size was normal. Systolic function was   normal. The estimated ejection fraction was in the range of 60%   to 65%. Wall motion was normal; there were no regional wall   motion abnormalities. Features are  consistent with a pseudonormal   left ventricular filling pattern, with concomitant abnormal   relaxation and increased filling pressure (grade 2 diastolic   dysfunction). - Aortic valve: There was very mild stenosis. Valve area (VTI):   2.01 cm^2. Valve area (Vmax): 2.09 cm^2. Valve area (Vmean): 1.96   cm^2. - Mitral valve: Calcified annulus. -  Left atrium: The atrium was mildly dilated. - Pulmonary arteries: Systolic pressure was mildly to moderately   increased. PA peak pressure: 46 mm Hg (S).   Nuclear stress test 12/22/08: Impression: Normal myocardial perfusion study.  No significant ischemia demonstrated.  This is a low risk scan. Patient experienced chest pain during exercise and/or pharmacologic stress test. Chest pain resolved spontaneously. Normal myocardial perfusion scan demonstrating an attenuation artifact in the anterior region of the myocardium. No ischemia or infarct/scar is seen in the remaining myocardium.   Past Medical History:  Diagnosis Date  . Arthritis    left hip and back  . Bronchiectasis    isolated to RML; status post right middle lobe partial resection.  . Complication of anesthesia    hard to wake up  . Dyspnea   . Gall stones   . GERD (gastroesophageal reflux disease)    history   . H/O osteoporosis   . Hypertension   . Hyperthyroidism    endocrinologist - Dr. Chalmers Cater  . Pneumonia   . Yeast infection     Past Surgical History:  Procedure Laterality Date  . ABDOMINAL HYSTERECTOMY  1974  . CHOLECYSTECTOMY N/A 07/02/2016   Procedure: LAPAROSCOPIC CHOLECYSTECTOMY;  Surgeon: Stark Klein, MD;  Location: [facility];  Service: General;  Laterality: N/A;  . COLONOSCOPY    . ENDOSCOPIC RETROGRADE CHOLANGIOPANCREATOGRAPHY (ERCP) WITH PROPOFOL N/A 08/07/2018   Procedure: ENDOSCOPIC RETROGRADE CHOLANGIOPANCREATOGRAPHY (ERCP) WITH PROPOFOL;  Surgeon: Carol Ada, MD;  [facility];  Service: Endoscopy;  Laterality: N/A;  . EYE SURGERY     stye removed  . HERNIA REPAIR  1975  . KNEE SURGERY Left   . LUNG REMOVAL, PARTIAL  208   RML  . NM MYOVIEW LTD  06/2011   Normal EF. No ischemia or infarction.  [name]  08/07/2018   Procedure: [name];  Surgeon: Carol Ada, MD;  [facility];  Service: Endoscopy;;  balloon sweep  . TOTAL HIP ARTHROPLASTY Left 04/05/2017    Procedure: LEFT TOTAL HIP ARTHROPLASTY ANTERIOR APPROACH;  Surgeon: [name], MD;  Location: [facility];  Service: Orthopedics;  Laterality: Left;  . TRANSTHORACIC ECHOCARDIOGRAM  06/2011   Mild concentric LVH. Normal EF with impaired relaxation. Mildly elevated RV pressures of 30 and 40 mmHg.  Marland Kitchen TRANSTHORACIC ECHOCARDIOGRAM  06/2016   normal LV size and function. EF 60-65% with GRD 2 DD. Mild aortic stenosis. Mild LA dilation. Mild to moderately increased PA pressures (46 mmHg).  . TUBAL LIGATION      MEDICATIONS: . diclofenac sodium (VOLTAREN) 1 % GEL  . furosemide (LASIX) 20 MG tablet  . lidocaine (LIDODERM) 5 %  . methimazole (TAPAZOLE) 5 MG tablet  . metoprolol succinate (TOPROL-XL) 25 MG 24 hr tablet  . potassium chloride SA (K-DUR,KLOR-CON) 20 MEQ tablet   No current facility-administered medications for this encounter.     George Hugh [facility] Stay Center/Anesthesiology Phone [phone] 09/10/2018 2:50 PM

## 2018-09-18 NOTE — H&P (Signed)
TOTAL HIP ADMISSION H&P  Patient is admitted for right total hip arthroplasty.  Subjective:  Chief Complaint: right hip pain  HPI: [name], 84 y.o. female, has a history of pain and functional disability in the right hip(s) due to arthritis and patient has failed non-surgical conservative treatments for greater than 12 weeks to include NSAID's and/or analgesics, use of assistive devices and activity modification.  Onset of symptoms was gradual starting 10 years ago with gradually worsening course since that time. Patient currently rates pain in the right hip at 10 out of 10 with activity. Patient has worsening of pain with activity and weight bearing, trendelenberg gait, pain that interfers with activities of daily living, pain with passive range of motion and crepitus. Patient has evidence of subchondral sclerosis, periarticular osteophytes and joint space narrowing by imaging studies. This condition presents safety issues increasing the risk of falls.  There is no current active infection.  Patient Active Problem List   Diagnosis Date Noted  . H/O total hip arthroplasty, left 11/07/2017  . Post-traumatic osteoarthritis of left knee 11/07/2017  . Unilateral primary osteoarthritis, right knee 11/07/2017  . Unilateral primary osteoarthritis, right hip 08/06/2017  . Presence of left artificial hip joint 05/14/2017  . Preop cardiovascular exam 03/12/2017  . Cough   . Abdominal pain   . Symptomatic cholelithiasis 07/01/2016  . Nonspecific ST-T wave electrocardiographic changes   . Lower leg edema 02/16/2015  . Obesity (BMI 30.0-34.9) 02/16/2015  . Osteoporosis 01/11/2012  . Essential hypertension 01/11/2012  . Hyperthyroidism 01/11/2012  . Hypercholesteremia 01/11/2012  . H/O vaginal hysterectomy 1974 01/11/2012    Class: History of  . S/P removal of lung  partial R lobe  2010 01/11/2012  . History of hernia repair  R ing. 1975 01/11/2012  . BACK PAIN 11/12/2007  . DYSPNEA  11/11/2007   Past Medical History:  Diagnosis Date  . Arthritis    left hip and back  . Bronchiectasis    isolated to RML; status post right middle lobe partial resection.  . Complication of anesthesia    hard to wake up  . Dyspnea   . Gall stones   . GERD (gastroesophageal reflux disease)    history   . H/O osteoporosis   . Hypertension   . Hyperthyroidism    endocrinologist - Dr. Chalmers Cater  . Pneumonia   . Yeast infection     Past Surgical History:  Procedure Laterality Date  . ABDOMINAL HYSTERECTOMY  1974  . CHOLECYSTECTOMY N/A 07/02/2016   Procedure: LAPAROSCOPIC CHOLECYSTECTOMY;  Surgeon: Stark Klein, MD;  Location: [facility];  Service: General;  Laterality: N/A;  . COLONOSCOPY    . ENDOSCOPIC RETROGRADE CHOLANGIOPANCREATOGRAPHY (ERCP) WITH PROPOFOL N/A 08/07/2018   Procedure: ENDOSCOPIC RETROGRADE CHOLANGIOPANCREATOGRAPHY (ERCP) WITH PROPOFOL;  Surgeon: Carol Ada, MD;  [facility];  Service: Endoscopy;  Laterality: N/A;  . EYE SURGERY     stye removed  . HERNIA REPAIR  1975  . KNEE SURGERY Left   . LUNG REMOVAL, PARTIAL  208   RML  . NM MYOVIEW LTD  06/2011   Normal EF. No ischemia or infarction.  [name]  08/07/2018   Procedure: [name];  Surgeon: Carol Ada, MD;  [facility];  Service: Endoscopy;;  balloon sweep  . TOTAL HIP ARTHROPLASTY Left 04/05/2017   Procedure: LEFT TOTAL HIP ARTHROPLASTY ANTERIOR APPROACH;  Surgeon: [name], MD;  Location: [facility];  Service: Orthopedics;  Laterality: Left;  . TRANSTHORACIC ECHOCARDIOGRAM  06/2011  Mild concentric LVH. Normal EF with impaired relaxation. Mildly elevated RV pressures of 30 and 40 mmHg.  Marland Kitchen TRANSTHORACIC ECHOCARDIOGRAM  06/2016   normal LV size and function. EF 60-65% with GRD 2 DD. Mild aortic stenosis. Mild LA dilation. Mild to moderately increased PA pressures (46 mmHg).  . TUBAL LIGATION      No current facility-administered medications for this encounter.     Current Outpatient Medications  Medication Sig Dispense Refill Last Dose  . diclofenac sodium (VOLTAREN) 1 % GEL APPLY  2-4 GRAMS TO EACH KNEE  TOPICALLY UP TO  4 TIMES DAILY (Patient taking differently: Apply 1 application topically 3 (three) times daily as needed (for pain). ) 2 Tube 1 Taking  . furosemide (LASIX) 20 MG tablet Take 20 mg by mouth daily.     Taking  . lidocaine (LIDODERM) 5 % Place 1 patch onto the skin daily as needed (for pain). Remove & Discard patch within 12 hours or as directed by MD     . methimazole (TAPAZOLE) 5 MG tablet Take 2.5 mg by mouth daily.     . metoprolol succinate (TOPROL-XL) 25 MG 24 hr tablet Take 25 mg by mouth at bedtime.    Taking  . potassium chloride SA (K-DUR,KLOR-CON) 20 MEQ tablet Take 20 mEq by mouth daily.    Taking   Allergies  Allergen Reactions  . Ibuprofen Other (See Comments)    hallucinations  . Naproxen Sodium Other (See Comments)    hallucinations    Social History   Tobacco Use  . Smoking status: Never Smoker  . Smokeless tobacco: Never Used  Substance Use Topics  . Alcohol use: No    Family History  Problem Relation Age of Onset  . Hypertension Mother   . CVA Mother 62  . Heart attack Sister   . Diabetes Sister   . Heart attack Brother   . Heart attack Brother      Review of Systems  Constitutional: Negative.   HENT: Negative.   Respiratory: Negative.   Cardiovascular: Negative.   Genitourinary: Negative.   Musculoskeletal: Positive for joint pain.  Skin: Negative.   Neurological: Negative.     Objective:  Physical Exam  Constitutional: She is oriented to person, place, and time. She appears well-developed. No distress.  HENT:  Head: Normocephalic.  Eyes: Pupils are equal, round, and reactive to light.  Neck: Normal range of motion.  Musculoskeletal:        General: Tenderness present.  Neurological: She is alert and oriented to person, place, and time.  Skin: Skin is warm and dry.  Psychiatric:  She has a normal mood and affect.    Vital signs in last 24 hours: Weight:  [84.3 kg] 84.3 kg (12/12 1525)  Labs:   Estimated body mass index is 30.93 kg/m as calculated from the following:   Height as of this encounter: 5\' 5"  (1.651 m).   Weight as of this encounter: 84.3 kg.   Imaging Review Plain radiographs demonstrate moderate degenerative joint disease of the right hip(s). The bone quality appears to be good for age and reported activity level.    Preoperative templating of the joint replacement has been completed, documented, and submitted to the Operating Room personnel in order to optimize intra-operative equipment management.     Assessment/Plan:  End stage arthritis, right hip(s)  The patient history, physical examination, clinical judgement of the provider and imaging studies are consistent with end stage degenerative joint disease of the right hip(s)  and total hip arthroplasty is deemed medically necessary. The treatment options including medical management, injection therapy, arthroscopy and arthroplasty were discussed at length. The risks and benefits of total hip arthroplasty were presented and reviewed. The risks due to aseptic loosening, infection, stiffness, dislocation/subluxation,  thromboembolic complications and other imponderables were discussed.  The patient acknowledged the explanation, agreed to proceed with the plan and consent was signed. Patient is being admitted for inpatient treatment for surgery, pain control, PT, OT, prophylactic antibiotics, VTE prophylaxis, progressive ambulation and ADL's and discharge planning.The patient is planning to be discharged home with home health services

## 2018-09-19 ENCOUNTER — Encounter (HOSPITAL_COMMUNITY): Admission: RE | Payer: Self-pay | Source: Ambulatory Visit

## 2018-09-19 ENCOUNTER — Inpatient Hospital Stay (HOSPITAL_COMMUNITY): Admission: RE | Admit: 2018-09-19 | Payer: Medicare Other | Source: Ambulatory Visit | Admitting: Orthopaedic Surgery

## 2018-09-19 SURGERY — ARTHROPLASTY, HIP, TOTAL, ANTERIOR APPROACH
Anesthesia: General | Laterality: Right

## 2018-09-19 NOTE — Progress Notes (Signed)
Patient did not show up at 5:30.A call was placed to the patient. Patient stated that she had called to cancel her surgery due to a thyroid issue. OR desk notified, Dr Lorin Mercy called to inform of patient status. Dr Lorin Mercy gave authorization to remove from schedule. Call placed to Secor at the [facility] front desk to remove case. Charge CRNA called as well.

## 2018-09-19 NOTE — Anesthesia Preprocedure Evaluation (Deleted)
Anesthesia Evaluation    Reviewed: Allergy & Precautions, Patient's Chart, lab work & pertinent test results  History of Anesthesia Complications Negative for: history of anesthetic complications  Airway        Dental   Pulmonary neg pulmonary ROS,           Cardiovascular hypertension, Pt. on home beta blockers and Pt. on medications      Neuro/Psych negative neurological ROS  negative psych ROS   GI/Hepatic Neg liver ROS, GERD  ,  Endo/Other  Hyperthyroidism  Obesity   Renal/GU negative Renal ROS     Musculoskeletal  (+) Arthritis ,   Abdominal   Peds  Hematology negative hematology ROS (+)   Anesthesia Other Findings   Reproductive/Obstetrics                             Anesthesia Physical Anesthesia Plan  ASA: II  Anesthesia Plan: Spinal   Post-op Pain Management:    Induction:   PONV Risk Score and Plan: 2 and Treatment may vary due to age or medical condition and Propofol infusion  Airway Management Planned: Natural Airway and Simple Face Mask  Additional Equipment: None  Intra-op Plan:   Post-operative Plan:   Informed Consent:   Plan Discussed with: CRNA and Anesthesiologist  Anesthesia Plan Comments:         Anesthesia Quick Evaluation

## 2018-09-22 DIAGNOSIS — E049 Nontoxic goiter, unspecified: Secondary | ICD-10-CM | POA: Diagnosis not present

## 2018-09-22 DIAGNOSIS — E0590 Thyrotoxicosis, unspecified without thyrotoxic crisis or storm: Secondary | ICD-10-CM | POA: Diagnosis not present

## 2018-09-22 DIAGNOSIS — R748 Abnormal levels of other serum enzymes: Secondary | ICD-10-CM | POA: Diagnosis not present

## 2018-10-03 ENCOUNTER — Encounter (INDEPENDENT_AMBULATORY_CARE_PROVIDER_SITE_OTHER): Payer: Self-pay | Admitting: Orthopaedic Surgery

## 2018-10-03 ENCOUNTER — Ambulatory Visit (INDEPENDENT_AMBULATORY_CARE_PROVIDER_SITE_OTHER): Payer: Medicare Other | Admitting: Orthopaedic Surgery

## 2018-10-03 VITALS — BP 111/68 | HR 65 | Ht 65.0 in | Wt 185.0 lb

## 2018-10-03 DIAGNOSIS — M1611 Unilateral primary osteoarthritis, right hip: Secondary | ICD-10-CM

## 2018-10-03 DIAGNOSIS — E0590 Thyrotoxicosis, unspecified without thyrotoxic crisis or storm: Secondary | ICD-10-CM | POA: Diagnosis not present

## 2018-10-03 NOTE — Progress Notes (Signed)
Office Visit Note   Patient: [name]           Date of Birth: 1933/03/05           MRN: [id number] Visit Date: 10/03/2018              Requested by: Glendale Chard, [address] [place], [place] [address] PCP: Glendale Chard, MD   Assessment & Plan: Visit Diagnoses: No diagnosis found.  Plan: Patient will follow up with Dr. Haynes Hoehn for treatment of her thyroid.  Once this is taking care of and she is cleared for surgery she will call us for rescheduling for her total hip arthroplasty.  Follow-Up Instructions: Right total hip arthroplasty when she is cleared from her thyroid condition.  Orders:  No orders of the defined types were placed in this encounter.  No orders of the defined types were placed in this encounter.     Procedures: No procedures performed   Clinical Data: No additional findings.   Subjective: Chief Complaint  Patient presents with  . Right Hip - Follow-up    HPI has end-stage right hip osteoarthritis.  She has thyrotoxicosis and has been thinking about radioactive thyroid treatment versus thyroidectomy.  Her hip is getting worse she wants to proceed with surgery but I as I discussed with her previously and several times over the phone she needs to get a thyroid problem fixed first before she can proceed with the hip surgery.  I have encouraged her to get back with Dr. Chalmers Cater and get started on the treatment for her thyroid.  She will call when she is cleared for surgery.  Review of Systems updated and unchanged from 05/20/2018.  Recent knee injection done by Dr. Sharol Given.   Objective: Vital Signs: BP 111/68   Pulse 65   Ht 5\' 5"  (1.651 m)   Wt 185 lb (83.9 kg)   BMI 30.79 kg/m   Physical Exam Constitutional:      Appearance: She is well-developed.  HENT:     Head: Normocephalic.     Right Ear: External ear normal.     Left Ear: External ear normal.  Eyes:     Pupils: Pupils are equal, round, and reactive to light.    Neck:     Thyroid: No thyromegaly.     Trachea: No tracheal deviation.     Comments: Prominent thyroid Cardiovascular:     Rate and Rhythm: Normal rate.  Pulmonary:     Effort: Pulmonary effort is normal.  Abdominal:     Palpations: Abdomen is soft.  Skin:    General: Skin is warm and dry.  Neurological:     Mental Status: She is alert and oriented to person, place, and time.  Psychiatric:        Behavior: Behavior normal.     Ortho Exam well-healed left total hip arthroplasty incision.  Right hip 15 degrees internal rotation with sharp groin pain.  Positive Trendelenburg gait with cane.  Distal pulses are intact.  Crepitus of both knees range of motion.  Healed scar left knee from previous tibial plateau fixation with pins.  Specialty Comments:  No specialty comments available.  Imaging: No results found.   PMFS History: Patient Active Problem List   Diagnosis Date Noted  . H/O total hip arthroplasty, left 11/07/2017  . Post-traumatic osteoarthritis of left knee 11/07/2017  . Unilateral primary osteoarthritis, right knee 11/07/2017  . Unilateral primary osteoarthritis, right hip 08/06/2017  . Presence  of left artificial hip joint 05/14/2017  . Preop cardiovascular exam 03/12/2017  . Cough   . Abdominal pain   . Symptomatic cholelithiasis 07/01/2016  . Nonspecific ST-T wave electrocardiographic changes   . Lower leg edema 02/16/2015  . Obesity (BMI 30.0-34.9) 02/16/2015  . Osteoporosis 01/11/2012  . Essential hypertension 01/11/2012  . Hyperthyroidism 01/11/2012  . Hypercholesteremia 01/11/2012  . H/O vaginal hysterectomy 1974 01/11/2012    Class: History of  . S/P removal of lung  partial R lobe  2010 01/11/2012  . History of hernia repair  R ing. 1975 01/11/2012  . BACK PAIN 11/12/2007  . DYSPNEA 11/11/2007   Past Medical History:  Diagnosis Date  . Arthritis    left hip and back  . Bronchiectasis    isolated to RML; status post right middle lobe partial  resection.  . Complication of anesthesia    hard to wake up  . Dyspnea   . Gall stones   . GERD (gastroesophageal reflux disease)    history   . H/O osteoporosis   . Hypertension   . Hyperthyroidism    endocrinologist - Dr. Chalmers Cater  . Pneumonia   . Yeast infection     Family History  Problem Relation Age of Onset  . Hypertension Mother   . CVA Mother 83  . Heart attack Sister   . Diabetes Sister   . Heart attack Brother   . Heart attack Brother     Past Surgical History:  Procedure Laterality Date  . ABDOMINAL HYSTERECTOMY  1974  . CHOLECYSTECTOMY N/A 07/02/2016   Procedure: LAPAROSCOPIC CHOLECYSTECTOMY;  Surgeon: Stark Klein, MD;  Location: [facility];  Service: General;  Laterality: N/A;  . COLONOSCOPY    . ENDOSCOPIC RETROGRADE CHOLANGIOPANCREATOGRAPHY (ERCP) WITH PROPOFOL N/A 08/07/2018   Procedure: ENDOSCOPIC RETROGRADE CHOLANGIOPANCREATOGRAPHY (ERCP) WITH PROPOFOL;  Surgeon: Carol Ada, MD;  [facility];  Service: Endoscopy;  Laterality: N/A;  . EYE SURGERY     stye removed  . HERNIA REPAIR  1975  . KNEE SURGERY Left   . LUNG REMOVAL, PARTIAL  208   RML  . NM MYOVIEW LTD  06/2011   Normal EF. No ischemia or infarction.  [name]  08/07/2018   Procedure: [name];  Surgeon: Carol Ada, MD;  [facility];  Service: Endoscopy;;  balloon sweep  . TOTAL HIP ARTHROPLASTY Left 04/05/2017   Procedure: LEFT TOTAL HIP ARTHROPLASTY ANTERIOR APPROACH;  Surgeon: [name], MD;  Location: [facility];  Service: Orthopedics;  Laterality: Left;  . TRANSTHORACIC ECHOCARDIOGRAM  06/2011   Mild concentric LVH. Normal EF with impaired relaxation. Mildly elevated RV pressures of 30 and 40 mmHg.  Marland Kitchen TRANSTHORACIC ECHOCARDIOGRAM  06/2016   normal LV size and function. EF 60-65% with GRD 2 DD. Mild aortic stenosis. Mild LA dilation. Mild to moderately increased PA pressures (46 mmHg).  . TUBAL LIGATION     Social History   Occupational History  . Not on  file  Tobacco Use  . Smoking status: Never Smoker  . Smokeless tobacco: Never Used  Substance and Sexual Activity  . Alcohol use: No  . Drug use: No  . Sexual activity: Never    Birth control/protection: Abstinence

## 2018-10-06 DIAGNOSIS — R740 Nonspecific elevation of levels of transaminase and lactic acid dehydrogenase [LDH]: Secondary | ICD-10-CM | POA: Diagnosis not present

## 2018-10-15 ENCOUNTER — Telehealth (INDEPENDENT_AMBULATORY_CARE_PROVIDER_SITE_OTHER): Payer: Self-pay | Admitting: Orthopaedic Surgery

## 2018-10-15 NOTE — Telephone Encounter (Signed)
Patient called needing a Rx for a scooter so that she can shop in stores. Patient said it is to much walking for her. Thr number to contact patient is [phone]

## 2018-10-15 NOTE — Telephone Encounter (Signed)
Please advise 

## 2018-10-16 NOTE — Telephone Encounter (Signed)
I called discussed.  

## 2018-10-16 NOTE — Telephone Encounter (Signed)
I called x2 and left a message.

## 2018-10-21 DIAGNOSIS — E049 Nontoxic goiter, unspecified: Secondary | ICD-10-CM | POA: Diagnosis not present

## 2018-10-21 DIAGNOSIS — E0590 Thyrotoxicosis, unspecified without thyrotoxic crisis or storm: Secondary | ICD-10-CM | POA: Diagnosis not present

## 2018-10-21 DIAGNOSIS — R748 Abnormal levels of other serum enzymes: Secondary | ICD-10-CM | POA: Diagnosis not present

## 2018-10-28 DIAGNOSIS — E049 Nontoxic goiter, unspecified: Secondary | ICD-10-CM | POA: Diagnosis not present

## 2018-10-28 DIAGNOSIS — R748 Abnormal levels of other serum enzymes: Secondary | ICD-10-CM | POA: Diagnosis not present

## 2018-10-28 DIAGNOSIS — E0590 Thyrotoxicosis, unspecified without thyrotoxic crisis or storm: Secondary | ICD-10-CM | POA: Diagnosis not present

## 2018-11-12 ENCOUNTER — Telehealth (INDEPENDENT_AMBULATORY_CARE_PROVIDER_SITE_OTHER): Payer: Self-pay | Admitting: Orthopaedic Surgery

## 2018-11-12 NOTE — Telephone Encounter (Signed)
Patient called to state that she needed a note for her son & daughter so they can take to their employer stating that she is having surgery and will need some assistance  [phone].

## 2018-11-12 NOTE — Telephone Encounter (Signed)
Ok for note 

## 2018-11-12 NOTE — Telephone Encounter (Signed)
Ok thanks 

## 2018-11-14 NOTE — Telephone Encounter (Signed)
Note entered and 2 copies printed at front desk. Patient advised.

## 2018-11-21 ENCOUNTER — Ambulatory Visit (INDEPENDENT_AMBULATORY_CARE_PROVIDER_SITE_OTHER): Payer: Medicare Other | Admitting: Internal Medicine

## 2018-11-21 ENCOUNTER — Encounter: Payer: Self-pay | Admitting: Internal Medicine

## 2018-11-21 ENCOUNTER — Other Ambulatory Visit: Payer: Self-pay

## 2018-11-21 VITALS — BP 122/78 | HR 68 | Temp 97.7°F | Ht 60.4 in | Wt 194.8 lb

## 2018-11-21 DIAGNOSIS — N182 Chronic kidney disease, stage 2 (mild): Secondary | ICD-10-CM | POA: Diagnosis not present

## 2018-11-21 DIAGNOSIS — E7800 Pure hypercholesterolemia, unspecified: Secondary | ICD-10-CM

## 2018-11-21 DIAGNOSIS — I129 Hypertensive chronic kidney disease with stage 1 through stage 4 chronic kidney disease, or unspecified chronic kidney disease: Secondary | ICD-10-CM | POA: Diagnosis not present

## 2018-11-21 DIAGNOSIS — R7309 Other abnormal glucose: Secondary | ICD-10-CM

## 2018-11-21 DIAGNOSIS — Z6837 Body mass index (BMI) 37.0-37.9, adult: Secondary | ICD-10-CM | POA: Diagnosis not present

## 2018-11-21 DIAGNOSIS — R300 Dysuria: Secondary | ICD-10-CM | POA: Diagnosis not present

## 2018-11-21 LAB — POCT URINALYSIS DIPSTICK
Bilirubin, UA: NEGATIVE
Blood, UA: NEGATIVE
Glucose, UA: NEGATIVE
Ketones, UA: NEGATIVE
Nitrite, UA: NEGATIVE
Protein, UA: NEGATIVE
Spec Grav, UA: >=1.03 — AB (ref 1.010–1.025)
Urobilinogen, UA: 0.2 E.U./dL
pH, UA: 5.5 (ref 5.0–8.0)

## 2018-11-22 LAB — BMP8+EGFR
BUN/Creatinine Ratio: 18 (ref 12–28)
BUN: 13 mg/dL (ref 8–27)
CO2: 24 mmol/L (ref 20–29)
Calcium: 9.2 mg/dL (ref 8.7–10.3)
Chloride: 101 mmol/L (ref 96–106)
Creatinine, Ser: 0.71 mg/dL (ref 0.57–1.00)
GFR calc Af Amer: 91 mL/min/{1.73_m2} (ref >59)
GFR calc non Af Amer: 79 mL/min/{1.73_m2} (ref >59)
Glucose: 85 mg/dL (ref 65–99)
Potassium: 4.4 mmol/L (ref 3.5–5.2)
Sodium: 141 mmol/L (ref 134–144)

## 2018-11-22 LAB — HEMOGLOBIN A1C
Est. average glucose Bld gHb Est-mCnc: 100 mg/dL
Hgb A1c MFr Bld: 5.1 % (ref 4.8–5.6)

## 2018-11-22 NOTE — Progress Notes (Signed)
Subjective:     Patient ID: [name] , female    DOB: [birth year] , 85 y.o.   MRN: [id number]   Chief Complaint  Patient presents with  . Urinary Tract Infection    11/20/18-stinging urination-    HPI  She presents today for evaluation of dysuria. Sx started two days ago. She is concerned because she is scheduled for right hip replacement surgery March 2020. Her right total hip arthroplasty was delayed due to elevated LFTs and hyperthyroidism. She is definitely ready to move forward with surgery b/c she is in a great deal of pain.   Urinary Tract Infection       Past Medical History:  Diagnosis Date  . Arthritis    left hip and back  . Bronchiectasis    isolated to RML; status post right middle lobe partial resection.  . Complication of anesthesia    hard to wake up  . Dyspnea   . Gall stones   . GERD (gastroesophageal reflux disease)    history   . H/O osteoporosis   . Hypertension   . Hyperthyroidism    endocrinologist - Dr. Chalmers Cater  . Pneumonia   . Yeast infection      Family History  Problem Relation Age of Onset  . Hypertension Mother   . CVA Mother 28  . Heart attack Sister   . Diabetes Sister   . Heart attack Brother   . Heart attack Brother      Current Outpatient Medications:  .  diclofenac sodium (VOLTAREN) 1 % GEL, APPLY  2-4 GRAMS TO EACH KNEE  TOPICALLY UP TO  4 TIMES DAILY, Disp: 2 Tube, Rfl: 1 .  furosemide (LASIX) 20 MG tablet, Take 20 mg by mouth daily.  , Disp: , Rfl:  .  lidocaine (LIDODERM) 5 %, Place 1 patch onto the skin daily as needed (for pain). Remove & Discard patch within 12 hours or as directed by MD, Disp: , Rfl:  .  methimazole (TAPAZOLE) 5 MG tablet, Take 2.5 mg by mouth daily., Disp: , Rfl:  .  metoprolol succinate (TOPROL-XL) 25 MG 24 hr tablet, Take 25 mg by mouth at bedtime. , Disp: , Rfl:  .  potassium chloride SA (K-DUR,KLOR-CON) 20 MEQ tablet, Take 20 mEq by mouth daily. , Disp: , Rfl:    Allergies  Allergen Reactions   . Ibuprofen Other (See Comments)    hallucinations  . Naproxen Sodium Other (See Comments)    hallucinations     Review of Systems  Constitutional: Negative.   Respiratory: Negative.   Cardiovascular: Negative.   Gastrointestinal: Negative.   Genitourinary: Positive for dysuria.  Neurological: Negative.   Psychiatric/Behavioral: Negative.      Today's Vitals   11/21/18 1535  BP: 122/78  Pulse: 68  Temp: 97.7 F (36.5 C)  TempSrc: Oral  SpO2: 97%  Weight: 194 lb 12.8 oz (88.4 kg)  Height: 5' 0.4" (1.534 m)   Body mass index is 37.54 kg/m.   Objective:  Physical Exam Vitals signs and nursing note reviewed.  Constitutional:      Appearance: Normal appearance. She is obese.  HENT:     Head: Normocephalic and atraumatic.  Cardiovascular:     Rate and Rhythm: Normal rate and regular rhythm.     Heart sounds: Normal heart sounds.  Pulmonary:     Effort: Pulmonary effort is normal.     Breath sounds: Normal breath sounds.  Abdominal:     General: Bowel sounds  are normal.     Palpations: Abdomen is soft.  Musculoskeletal:     Comments: Ambulatory with cane  Neurological:     General: No focal deficit present.     Mental Status: She is alert.         Assessment And Plan:     1. Dysuria  I will send off culture. She will drink cranberry/pomegranate juices.   - Culture, Urine - POCT Urinalysis Dipstick (81002)  2. Hypertensive nephropathy  Well controlled. She is scheduled for bp check on 3/9, I will cancel this appointment. She will rto in 4 months for re-evaluation.   - BMP8+EGFR  3. Chronic renal disease, stage II  Chronic. I will check a GFR, Cr today.   4. Other abnormal glucose  HER A1C HAS BEEN ELEVATED IN THE PAST. I WILL CHECK AN A1C, BMET TODAY. SHE WAS ENCOURAGED TO AVOID SUGARY BEVERAGES AND PROCESSED FOODS INCLUDNG BREADS, RICE AND PASTA.  - Hemoglobin A1c  5. Pure hypercholesterolemia  She is not taking meds at this time, it was  thought they could be contributing to her elevated LFTs. She ended up having CBD stone, but she does not want to take anything that could delay her surgery. She is willing to resume after her surgery.   6. Class 2 severe obesity due to excess calories with serious comorbidity and body mass index (BMI) of 37.0 to 37.9 in adult [facility])  She is encouraged to initially strive for BMI less than 30 to decrease cardiac risk. She is encouraged to incorporate more exercise into her daly routine once she has completed rehab for her right hip replacement.    Maximino Greenland, MD

## 2018-11-23 LAB — URINE CULTURE

## 2018-11-26 ENCOUNTER — Other Ambulatory Visit: Payer: Self-pay

## 2018-11-26 MED ORDER — AMOXICILLIN 500 MG PO TABS: 500.0000 mg | ORAL_TABLET | Freq: Two times a day (BID) | ORAL | 0 refills | Status: DC

## 2018-12-05 NOTE — Pre-Procedure Instructions (Signed)
[name]  12/05/2018      [facility] ([facility] [place] [address]  ([place]) [place] [address] Phone: [phone] Fax: [phone]  [facility] [place], [place] [address] Phone: [phone] Fax: [phone]    Your procedure is scheduled on December 19, 2018.  Report to [facility] Entrance A at 530 AM.  Call this number if you have problems the morning of surgery:  [phone]   Remember:  Do not eat or drink after midnight.    Take these medicines the morning of surgery with A SIP OF WATER  metoprolol succinate (TOPROL-XL) amoxicillin (AMOXIL)  methimazole (TAPAZOLE)  7 days prior to surgery STOP taking any Aspirin (unless otherwise instructed by your surgeon), Aleve, Naproxen, Ibuprofen, Motrin, Advil, Goody's, BC's, all herbal medications, fish oil, and all vitamins    Do not wear jewelry, make-up or nail polish.  Do not wear lotions, powders, or perfumes, or deodorant.  Do not shave 48 hours prior to surgery.    Do not bring valuables to the hospital.  [facility] is not responsible for any belongings or valuables.  Contacts, dentures or bridgework may not be worn into surgery.  Leave your suitcase in the car.  After surgery it may be brought to your room.  For patients admitted to the hospital, discharge time will be determined by your treatment team.  Patients discharged the day of surgery will not be allowed to drive home.    [facility]- Preparing For Surgery  Before surgery, you can play an important role. Because skin is not sterile, your skin needs to be as free of germs as possible. You can reduce the number of germs on your skin by washing with CHG (chlorahexidine gluconate) Soap before surgery.  CHG is an antiseptic cleaner which kills germs and bonds with the skin to continue killing germs even after washing.    Oral Hygiene is  also important to reduce your risk of infection.  Remember - BRUSH YOUR TEETH THE MORNING OF SURGERY WITH YOUR REGULAR TOOTHPASTE  Please do not use if you have an allergy to CHG or antibacterial soaps. If your skin becomes reddened/irritated stop using the CHG.  Do not shave (including legs and underarms) for at least 48 hours prior to first CHG shower. It is OK to shave your face.  Please follow these instructions carefully.   1. Shower the NIGHT BEFORE SURGERY and the MORNING OF SURGERY with CHG.   2. If you chose to wash your hair, wash your hair first as usual with your normal shampoo.  3. After you shampoo, rinse your hair and body thoroughly to remove the shampoo.  4. Use CHG as you would any other liquid soap. You can apply CHG directly to the skin and wash gently with a scrungie or a clean washcloth.   5. Apply the CHG Soap to your body ONLY FROM THE NECK DOWN.  Do not use on open wounds or open sores. Avoid contact with your eyes, ears, mouth and genitals (private parts). Wash Face and genitals (private parts)  with your normal soap.  6. Wash thoroughly, paying special attention to the area where your surgery will be performed.  7. Thoroughly rinse your body with warm water from the neck down.  8. DO NOT shower/wash with your normal soap after using and rinsing off the  CHG Soap.  9. Pat yourself dry with a CLEAN TOWEL.  10. Wear CLEAN PAJAMAS to bed the night before surgery, wear comfortable clothes the morning of surgery  11. Place CLEAN SHEETS on your bed the night of your first shower and DO NOT SLEEP WITH PETS.  Day of Surgery:  Do not apply any deodorants/lotions.  Please wear clean clothes to the hospital/surgery center.   Remember to brush your teeth WITH YOUR REGULAR TOOTHPASTE.   Please read over the following fact sheets that you were given.

## 2018-12-08 ENCOUNTER — Encounter (HOSPITAL_COMMUNITY): Payer: Self-pay

## 2018-12-08 ENCOUNTER — Other Ambulatory Visit: Payer: Self-pay

## 2018-12-08 ENCOUNTER — Encounter (HOSPITAL_COMMUNITY)
Admission: RE | Admit: 2018-12-08 | Discharge: 2018-12-08 | Disposition: A | Discharge status: Home / Self Care | Payer: Medicare Other | Source: Ambulatory Visit | Attending: Orthopaedic Surgery | Admitting: Orthopaedic Surgery

## 2018-12-08 DIAGNOSIS — Z01812 Encounter for preprocedural laboratory examination: Secondary | ICD-10-CM | POA: Diagnosis not present

## 2018-12-08 LAB — CBC
HCT: 40.2 % (ref 36.0–46.0)
Hemoglobin: 14.1 g/dL (ref 12.0–15.0)
MCH: 28.7 pg (ref 26.0–34.0)
MCHC: 35.1 g/dL (ref 30.0–36.0)
MCV: 81.7 fL (ref 80.0–100.0)
Platelets: 304 10*3/uL (ref 150–400)
RBC: 4.92 MIL/uL (ref 3.87–5.11)
RDW: 14.2 % (ref 11.5–15.5)
WBC: 5.3 10*3/uL (ref 4.0–10.5)
nRBC: 0 % (ref 0.0–0.2)

## 2018-12-08 LAB — COMPREHENSIVE METABOLIC PANEL
ALT: 14 U/L (ref 0–44)
AST: 20 U/L (ref 15–41)
Albumin: 3.6 g/dL (ref 3.5–5.0)
Alkaline Phosphatase: 74 U/L (ref 38–126)
Anion gap: 8 (ref 5–15)
BUN: 7 mg/dL — ABNORMAL LOW (ref 8–23)
CO2: 23 mmol/L (ref 22–32)
Calcium: 8.9 mg/dL (ref 8.9–10.3)
Chloride: 107 mmol/L (ref 98–111)
Creatinine, Ser: 0.71 mg/dL (ref 0.44–1.00)
GFR calc Af Amer: >60 mL/min (ref >60)
GFR calc non Af Amer: >60 mL/min (ref >60)
Glucose, Bld: 90 mg/dL (ref 70–99)
Potassium: 3.9 mmol/L (ref 3.5–5.1)
Sodium: 138 mmol/L (ref 135–145)
Total Bilirubin: 1.1 mg/dL (ref 0.3–1.2)
Total Protein: 7.3 g/dL (ref 6.5–8.1)

## 2018-12-08 LAB — URINALYSIS, ROUTINE W REFLEX MICROSCOPIC
Bilirubin Urine: NEGATIVE
Glucose, UA: NEGATIVE mg/dL
Hgb urine dipstick: NEGATIVE
Ketones, ur: NEGATIVE mg/dL
Leukocytes,Ua: NEGATIVE
Nitrite: NEGATIVE
Protein, ur: NEGATIVE mg/dL
Specific Gravity, Urine: 1.017 (ref 1.005–1.030)
pH: 6 (ref 5.0–8.0)

## 2018-12-08 LAB — SURGICAL PCR SCREEN
MRSA, PCR: NEGATIVE
Staphylococcus aureus: NEGATIVE

## 2018-12-08 LAB — PROTIME-INR
INR: 1 (ref 0.8–1.2)
Prothrombin Time: 13.1 seconds (ref 11.4–15.2)

## 2018-12-08 LAB — APTT: aPTT: 31 seconds (ref 24–36)

## 2018-12-08 NOTE — Progress Notes (Signed)
PCP - Dr. Glendale Chard Cardiologist - Dr. Ellyn Hack; cardiac clearance on 07/28/18.   Chest x-ray - 07/27/18 EKG - 07/29/18 Stress Test - 06/2011 ECHO - 07/05/2016 Cardiac Cath - denies  Sleep Study - denies CPAP - denies  Blood Thinner Instructions:N/A Aspirin Instructions:N/A  Anesthesia review: Yes; review since last cancelled surgery. Allison/s note on 09/09/18  Patient denies shortness of breath, fever, cough and chest pain at PAT appointment   Patient verbalized understanding of instructions that were given to them at the PAT appointment. Patient was also instructed that they will need to review over the PAT instructions again at home before surgery.

## 2018-12-09 NOTE — Progress Notes (Signed)
Anesthesia Chart Review:  Case:  [id number] Date/Time:  12/19/18 0715   Procedure:  RIGHT TOTAL HIP ARTHROPLASTY-DIRECT ANTERIOR APPROACH (Right )   Anesthesia type:  General   Pre-op diagnosis:  right hip osteoarthritis   Location:  [facility] OR ROOM 06 / [facility]   Surgeon:  [name], MD      DISCUSSION: Patient is an 85 year old female scheduled for the above procedure.   Surgery was initially scheduled for 07/16/18, but was postponed to allow time for GI evaluation due to elevated LFTs. She underwent ERCP with bilary sphincterotomy 08/07/18 with normalization of LFTs. She was then rescheduled for 09/19/18, but patient did not show for surgery, stating she was cancelling her surgery due to a "thyroid issue" (known non-toxic goiter and hyperthyroidism treated with Tapazole). She is seeing endocrinologist Dr. Chalmers Cater who signed a 11/13/18 note stating patient is cleared for surgery from a medical (endocrinology) standpoint--noting that patient is euthyroid.   History includes never smoker, bronchiectasis (isolated to RML, s/p RM lobectomy 11/28/06), HTN, hyperthyroidism (on Tapazole), GERD, dyspnea. BMI is consistent with mild obesity. Reports she is "hard to wake up" after anesthesia.  She had preoperative cardiology evaluation on 07/28/18 by Jory Sims, NP. She wrote, "...based on ACC/AHA guidelines, [name] be at acceptable risk for the planned procedure without further cardiovascular testing." Of note, per 03/12/17 note by Dr. Ellyn Hack, patient has known "chronic borderline inferior ST elevations on her EKGS that are clearly not associated with acute coronary syndrome. Normal echocardiogram."  Based on currently available information, I would anticipate that she can proceed as planned from an anesthesia standpoint if no acute changes. Of note, 12/08/18 urine culture showed: 70,000 COLONIES/mL CITROBACTER KOSERI, susceptibilities still pending. Results routed to Dr. Lorin Mercy. Defer treatment  recommendations, if any, to surgeon.   VS: BP (!) 99/56   Pulse (!) 50   Temp 36.7 C   Ht 5\' 1"  (1.549 m)   Wt 87.5 kg   SpO2 98%   BMI 36.47 kg/m    PROVIDERS: Glendale Chard, MD is PCP. Last visit 11/21/18. She is aware of surgery plans. - Carol Ada, MD is GI. Glenetta Hew, MD is cardiologist. Last visit with Jory Sims, NP on 07/28/18 for follow-up of chronic chest pain felt to be musculoskeletal. She was felt acceptable risk for surgery.  - Jacelyn Pi, MD is endocrinologist. - Vaughan Browner, Praveen is pulmonologist. Last visit 01/03/16.   LABS: Labs reviewed: Acceptable for surgery. Defer recommendations regarding urine culture results to surgeon. A1c 5.1 on 11/21/18.  (all labs ordered are listed, but only abnormal results are displayed)  Labs Reviewed  URINE CULTURE - Abnormal; Notable for the following components:      Result Value   Culture   (*)    Value: 70,000 COLONIES/mL CITROBACTER KOSERI SUSCEPTIBILITIES TO FOLLOW Performed at [facility], [place] [address] [place], [place] [address]    All other components within normal limits  COMPREHENSIVE METABOLIC PANEL - Abnormal; Notable for the following components:   BUN 7 (*)    All other components within normal limits  URINALYSIS, ROUTINE W REFLEX MICROSCOPIC - Abnormal; Notable for the following components:   APPearance HAZY (*)    All other components within normal limits  SURGICAL PCR SCREEN  APTT  CBC  PROTIME-INR    IMAGES: CXR 07/15/18: IMPRESSION: No active cardiopulmonary disease.   EKG: 07/28/18 ([facility]): SR with PACs. ST elevation, consider early repolarization, pericarditis, or injury. Non-specific  T wave abnormality. Patient with known chronic ST elevations, primarily in inferolateral leads. She has reverse r wave progression in V1-2, V2-3 and T wave in V1 is upright and negative V2-3 when compared with 07/15/18 tracing, so question precordial lead placement.     CV: Echo 07/05/16: Study Conclusions - Left ventricle: The cavity size was normal. Systolic function was normal. The estimated ejection fraction was in the range of 60% to 65%. Wall motion was normal; there were no regional wall motion abnormalities. Features are consistent with a pseudonormal left ventricular filling pattern, with concomitant abnormal relaxation and increased filling pressure (grade 2 diastolic dysfunction). - Aortic valve: There was very mild stenosis. Valve area (VTI): 2.01 cm^2. Valve area (Vmax): 2.09 cm^2. Valve area (Vmean): 1.96 cm^2. - Mitral valve: Calcified annulus. - Left atrium: The atrium was mildly dilated. - Pulmonary arteries: Systolic pressure was mildly to moderately increased. PA peak pressure: 46 mm Hg (S).  Nuclear stress test 12/22/08: Impression: Normal myocardial perfusion study.  No significant ischemia demonstrated.  This is a low risk scan. Patient experienced chest pain during exercise and/or pharmacologic stress test. Chest pain resolved spontaneously. Normal myocardial perfusion scan demonstrating an attenuation artifact in the anterior region of the myocardium. No ischemia or infarct/scar is seen in the remaining myocardium.   Past Medical History:  Diagnosis Date  . Arthritis    left hip and back  . Bronchiectasis    isolated to RML; status post right middle lobe partial resection.  . Complication of anesthesia    hard to wake up  . Dyspnea   . Gall stones   . GERD (gastroesophageal reflux disease)    history   . H/O osteoporosis   . Hypertension   . Hyperthyroidism    endocrinologist - Dr. Chalmers Cater  . Pneumonia   . Yeast infection     Past Surgical History:  Procedure Laterality Date  . ABDOMINAL HYSTERECTOMY  1974  . CHOLECYSTECTOMY N/A 07/02/2016   Procedure: LAPAROSCOPIC CHOLECYSTECTOMY;  Surgeon: Stark Klein, MD;  Location: [facility];  Service: General;  Laterality: N/A;  . COLONOSCOPY    .  ENDOSCOPIC RETROGRADE CHOLANGIOPANCREATOGRAPHY (ERCP) WITH PROPOFOL N/A 08/07/2018   Procedure: ENDOSCOPIC RETROGRADE CHOLANGIOPANCREATOGRAPHY (ERCP) WITH PROPOFOL;  Surgeon: Carol Ada, MD;  Location: [facility];  Service: Endoscopy;  Laterality: N/A;  . EYE SURGERY     stye removed  . HERNIA REPAIR  1975  . KNEE SURGERY Left   . LUNG REMOVAL, PARTIAL  208   RML  . NM MYOVIEW LTD  06/2011   Normal EF. No ischemia or infarction.  [name]  08/07/2018   Procedure: [name];  Surgeon: Carol Ada, MD;  Location: [facility];  Service: Endoscopy;;  balloon sweep  . TOTAL HIP ARTHROPLASTY Left 04/05/2017   Procedure: LEFT TOTAL HIP ARTHROPLASTY ANTERIOR APPROACH;  Surgeon: [name], MD;  Location: [facility];  Service: Orthopedics;  Laterality: Left;  . TRANSTHORACIC ECHOCARDIOGRAM  06/2011   Mild concentric LVH. Normal EF with impaired relaxation. Mildly elevated RV pressures of 30 and 40 mmHg.  Marland Kitchen TRANSTHORACIC ECHOCARDIOGRAM  06/2016   normal LV size and function. EF 60-65% with GRD 2 DD. Mild aortic stenosis. Mild LA dilation. Mild to moderately increased PA pressures (46 mmHg).  . TUBAL LIGATION      MEDICATIONS: . amoxicillin (AMOXIL) 500 MG tablet  . diclofenac sodium (VOLTAREN) 1 % GEL  . furosemide (LASIX) 20 MG tablet  . lidocaine (LIDODERM) 5 %  . methimazole (TAPAZOLE) 10  MG tablet  . metoprolol succinate (TOPROL-XL) 25 MG 24 hr tablet  . potassium chloride SA (K-DUR,KLOR-CON) 20 MEQ tablet   No current facility-administered medications for this encounter.   Amoxicillin completed 12/02/18 (prescribed by PCP for Proteus mirabilis urine culture 11/21/18).    Myra Gianotti, PA-C Surgical Short Stay/Anesthesiology [facility] Phone [phone] [facility] Phone [phone] 12/09/2018 8:01 PM

## 2018-12-09 NOTE — Anesthesia Preprocedure Evaluation (Addendum)
Anesthesia Evaluation  Patient identified by MRN, date of birth, ID band Patient awake    Reviewed: Allergy & Precautions, NPO status , Patient's Chart, lab work & pertinent test results  History of Anesthesia Complications (+) history of anesthetic complications  Airway Mallampati: II  TM Distance: >3 FB Neck ROM: Full    Dental no notable dental hx. (+) Edentulous Upper, Edentulous Lower   Pulmonary shortness of breath,    Pulmonary exam normal breath sounds clear to auscultation       Cardiovascular Exercise Tolerance: Good hypertension, Pt. on medications Normal cardiovascular exam Rhythm:Regular Rate:Normal  Echo 9/17 Left ventricle: The cavity size was normal. Systolic function was   normal. The estimated ejection fraction was in the range of 60%   to 65%. Wall motion was normal; there were no regional wall   motion abnormalities. Features are consistent with a pseudonormal   left ventricular filling pattern, with concomitant abnormal   relaxation and increased filling pressure (grade 2 diastolic   dysfunction). - Aortic valve: There was very mild stenosis. Valve area (VTI):   2.01 cm^2. Valve area (Vmax): 2.09 cm^2. Valve area (Vmean): 1.96   cm^2.   Neuro/Psych negative neurological ROS     GI/Hepatic Neg liver ROS, GERD  ,  Endo/Other  Hyperthyroidism   Renal/GU Renal diseaseCr 0.71     Musculoskeletal negative musculoskeletal ROS (+)   Abdominal   Peds  Hematology Hgb 14.1   Anesthesia Other Findings   Reproductive/Obstetrics                         Anesthesia Physical Anesthesia Plan  ASA: III  Anesthesia Plan: General   Post-op Pain Management:    Induction: Intravenous  PONV Risk Score and Plan: 3 and Treatment may vary due to age or medical condition, Dexamethasone and Ondansetron  Airway Management Planned: Oral ETT  Additional Equipment:   Intra-op Plan:    Post-operative Plan: Extubation in OR  Informed Consent: I have reviewed the patients History and Physical, chart, labs and discussed the procedure including the risks, benefits and alternatives for the proposed anesthesia with the patient or authorized representative who has indicated his/her understanding and acceptance.     Dental advisory given  Plan Discussed with: CRNA  Anesthesia Plan Comments: (PAT note written 12/09/2018 by Myra Gianotti, PA-C. )      Anesthesia Quick Evaluation

## 2018-12-10 ENCOUNTER — Encounter (INDEPENDENT_AMBULATORY_CARE_PROVIDER_SITE_OTHER): Payer: Self-pay | Admitting: Surgery

## 2018-12-10 ENCOUNTER — Ambulatory Visit (INDEPENDENT_AMBULATORY_CARE_PROVIDER_SITE_OTHER): Payer: Medicare Other | Admitting: Cardiovascular Disease

## 2018-12-10 ENCOUNTER — Other Ambulatory Visit (INDEPENDENT_AMBULATORY_CARE_PROVIDER_SITE_OTHER): Payer: Self-pay | Admitting: Radiology

## 2018-12-10 ENCOUNTER — Encounter: Payer: Self-pay | Admitting: Cardiovascular Disease

## 2018-12-10 ENCOUNTER — Telehealth: Payer: Self-pay | Admitting: Cardiology

## 2018-12-10 ENCOUNTER — Ambulatory Visit (INDEPENDENT_AMBULATORY_CARE_PROVIDER_SITE_OTHER): Payer: Medicare Other | Admitting: Surgery

## 2018-12-10 VITALS — BP 100/50 | HR 74 | Ht 61.0 in | Wt 194.4 lb

## 2018-12-10 VITALS — BP 117/74 | HR 46 | Temp 98.0°F | Ht 61.0 in | Wt 193.0 lb

## 2018-12-10 DIAGNOSIS — E0590 Thyrotoxicosis, unspecified without thyrotoxic crisis or storm: Secondary | ICD-10-CM | POA: Diagnosis not present

## 2018-12-10 DIAGNOSIS — Z01818 Encounter for other preprocedural examination: Secondary | ICD-10-CM

## 2018-12-10 DIAGNOSIS — Z01810 Encounter for preprocedural cardiovascular examination: Secondary | ICD-10-CM

## 2018-12-10 DIAGNOSIS — I10 Essential (primary) hypertension: Secondary | ICD-10-CM

## 2018-12-10 DIAGNOSIS — I519 Heart disease, unspecified: Secondary | ICD-10-CM

## 2018-12-10 DIAGNOSIS — I5189 Other ill-defined heart diseases: Secondary | ICD-10-CM

## 2018-12-10 DIAGNOSIS — M1611 Unilateral primary osteoarthritis, right hip: Secondary | ICD-10-CM

## 2018-12-10 DIAGNOSIS — I350 Nonrheumatic aortic (valve) stenosis: Secondary | ICD-10-CM

## 2018-12-10 LAB — URINE CULTURE: Culture: 70000 — AB

## 2018-12-10 MED ORDER — AMOXICILLIN 500 MG PO TABS: 500.0000 mg | ORAL_TABLET | Freq: Two times a day (BID) | ORAL | 0 refills | Status: DC

## 2018-12-10 NOTE — Progress Notes (Signed)
Refill sent to pharmacy. Patient advised. 

## 2018-12-10 NOTE — Telephone Encounter (Signed)
Patient was seen in office.  Clearance given, paper signed and given to patient regarding clearance.

## 2018-12-10 NOTE — Patient Instructions (Signed)
Medication Instructions:  The current medical regimen is effective;  continue present plan and medications.  If you need a refill on your cardiac medications before your next appointment, please call your pharmacy.    Follow-Up: At [facility], you and your health needs are our priority.  As part of our continuing mission to provide you with exceptional heart care, we have created designated Provider Care Teams.  These Care Teams include your primary Cardiologist (physician) and Advanced Practice Providers (APPs -  Physician Assistants and Nurse Practitioners) who all work together to provide you with the care you need, when you need it. You will need a follow up appointment after surgery. Please call our office 2 months in advance to schedule this appointment.  You may see Glenetta Hew, MD or one of the following Advanced Practice Providers on your designated Care Team:   Rosaria Ferries, PA-C . Jory Sims, DNP, ANP

## 2018-12-10 NOTE — Telephone Encounter (Signed)
Received call back from patient.She stated Dr.Yates will be doing a right hip replacement 12/19/18.She saw him this morning and her pulse was 44.Stated he wanted her to be seen soon.Dr.Harding not in office this week.Appointment scheduled with DOD Dr.Kelly this afternoon at 3:20 pm

## 2018-12-10 NOTE — Progress Notes (Signed)
Per Dr. Lorin Mercy, send in refill of Amoxicillin for patient. I called patient and advised. Refill sent to [place] at [facility].

## 2018-12-10 NOTE — Telephone Encounter (Addendum)
°  Call from Dr Lorin Mercy office [phone], patient in office with HR of 44.  Also requesting patient to be seen asap to obtain pre op clearance ;3/13 surgery date (clearance form sent to pre op pool) Please contact patient on mobile phone    STAT if HR is under 50 or over 120 (normal HR is 60-100 beats per minute)  1) What is your heart rate? 44 at Dr Rodell Perna office  2) Do you have a log of your heart rate readings (document readings)? n/a  3) Do you have any other symptoms? n/a

## 2018-12-10 NOTE — Progress Notes (Signed)
Send in refill of amoxicillin  Same as last month ucall thanks.

## 2018-12-10 NOTE — Telephone Encounter (Signed)
Returned call to patient no answer.LMTC. 

## 2018-12-10 NOTE — Progress Notes (Signed)
Cardiology Office Note    Date:  12/11/2018   ID:  [name], [name] [birth year], MRN [id number]  PCP:  Glendale Chard, MD  Cardiologist:  Shelva Majestic, MD   No chief complaint on file.   History of Present Illness:  [name] is a 85 y.o. female who is being seen as an add-on for preoperative evaluation prior to undergoing right hip surgery on December 19, 2018 with Dr. Lorin Mercy.  [name] is a patient who has been seen by Dr. Ellyn Hack.  In September 2017 she was hospitalized with epigastric pain and ultimately was found to have gallbladder disease and underwent laparoscopic cholecystectomy.  An echo Doppler study at that time showed normal LV size and function with an EF of 60 to 65% with grade 2 diastolic dysfunction.  There was very mild aortic stenosis, mild LA dilation and mild to moderate increased PA pressure at 46 mm.  She last saw Dr. Ellyn Hack in June 2018.  He was deemed low risk for surgery and subsequently underwent left hip replacement without cardiovascular compromise.  She was last seen in our office in October 2019 by Jory Sims, NP for preoperative assessment prior to right hip surgery.  That time she was deemed acceptable risk.  The patient never underwent her surgery.  Today she was being evaluated prior to her surgery and reportedly her pulse was thought to be in the upper 40s.  As result our office was contacted and she is here now for preoperative assessment prior to her elective surgery next week December 19, 2018.  [name] denies any chest pain PND orthopnea.  She denies any presyncope or syncope.  She is unaware of any palpitations.  Past Medical History:  Diagnosis Date  . Arthritis    left hip and back  . Bronchiectasis    isolated to RML; status post right middle lobe partial resection.  . Complication of anesthesia    hard to wake up  . Dyspnea   . Gall stones   . GERD (gastroesophageal reflux disease)    history   . H/O osteoporosis   .  Hypertension   . Hyperthyroidism    endocrinologist - Dr. Chalmers Cater  . Pneumonia   . Yeast infection     Past Surgical History:  Procedure Laterality Date  . ABDOMINAL HYSTERECTOMY  1974  . CHOLECYSTECTOMY N/A 07/02/2016   Procedure: LAPAROSCOPIC CHOLECYSTECTOMY;  Surgeon: Stark Klein, MD;  Location: [facility];  Service: General;  Laterality: N/A;  . COLONOSCOPY    . ENDOSCOPIC RETROGRADE CHOLANGIOPANCREATOGRAPHY (ERCP) WITH PROPOFOL N/A 08/07/2018   Procedure: ENDOSCOPIC RETROGRADE CHOLANGIOPANCREATOGRAPHY (ERCP) WITH PROPOFOL;  Surgeon: Carol Ada, MD;  Location: [facility];  Service: Endoscopy;  Laterality: N/A;  . EYE SURGERY     stye removed  . HERNIA REPAIR  1975  . KNEE SURGERY Left   . LUNG REMOVAL, PARTIAL  208   RML  . NM MYOVIEW LTD  06/2011   Normal EF. No ischemia or infarction.  [name]  08/07/2018   Procedure: [name];  Surgeon: Carol Ada, MD;  Location: [facility];  Service: Endoscopy;;  balloon sweep  . TOTAL HIP ARTHROPLASTY Left 04/05/2017   Procedure: LEFT TOTAL HIP ARTHROPLASTY ANTERIOR APPROACH;  Surgeon: [name], MD;  Location: [facility];  Service: Orthopedics;  Laterality: Left;  . TRANSTHORACIC ECHOCARDIOGRAM  06/2011   Mild concentric LVH. Normal EF with impaired relaxation. Mildly elevated RV pressures of 30 and 40 mmHg.  Marland Kitchen TRANSTHORACIC ECHOCARDIOGRAM  06/2016   normal LV size and function. EF 60-65% with GRD 2 DD. Mild aortic stenosis. Mild LA dilation. Mild to moderately increased PA pressures (46 mmHg).  . TUBAL LIGATION      Current Medications: Outpatient Medications Prior to Visit  Medication Sig Dispense Refill  . amoxicillin (AMOXIL) 500 MG tablet Take 1 tablet (500 mg total) by mouth 2 (two) times daily. 14 tablet 0  . diclofenac sodium (VOLTAREN) 1 % GEL APPLY  2-4 GRAMS TO EACH KNEE  TOPICALLY UP TO  4 TIMES DAILY (Patient taking differently: Apply 2 g topically 4 (four) times daily as needed (joint pain). ) 2 Tube 1  .  furosemide (LASIX) 20 MG tablet Take 20 mg by mouth daily.      Marland Kitchen lidocaine (LIDODERM) 5 % Place 1 patch onto the skin daily as needed (for pain). Remove & Discard patch within 12 hours or as directed by MD    . methimazole (TAPAZOLE) 10 MG tablet Take 10 mg by mouth 2 (two) times daily.     . metoprolol succinate (TOPROL-XL) 25 MG 24 hr tablet Take 25 mg by mouth at bedtime.     . potassium chloride SA (K-DUR,KLOR-CON) 20 MEQ tablet Take 20 mEq by mouth daily.      No facility-administered medications prior to visit.      Allergies:   Ibuprofen and Naproxen sodium   Social History   Socioeconomic History  . Marital status: Widowed    Spouse name: Not on file  . Number of children: Not on file  . Years of education: Not on file  . Highest education level: Not on file  Occupational History  . Not on file  Social Needs  . Financial resource strain: Not on file  . Food insecurity:    Worry: Not on file    Inability: Not on file  . Transportation needs:    Medical: Not on file    Non-medical: Not on file  Tobacco Use  . Smoking status: Never Smoker  . Smokeless tobacco: Never Used  Substance and Sexual Activity  . Alcohol use: No  . Drug use: No  . Sexual activity: Never    Birth control/protection: Abstinence  Lifestyle  . Physical activity:    Days per week: Not on file    Minutes per session: Not on file  . Stress: Not on file  Relationships  . Social connections:    Talks on phone: Not on file    Gets together: Not on file    Attends religious service: Not on file    Active member of club or organization: Not on file    Attends meetings of clubs or organizations: Not on file    Relationship status: Not on file  Other Topics Concern  . Not on file  Social History Narrative   She is a widowed mother of 70, grandmother for, a great-grandmother 35.   She has been trying to do more exercise than before, but has been injured recently by her back pain.   She never  smoked, doesn't drink alcohol.     Family History:  The patient's family history includes CVA (age of onset: 32) in her mother; Diabetes in her sister; Heart attack in her brother, brother, and sister; Hypertension in her mother.   ROS General: Negative; No fevers, chills, or night sweats;  HEENT: Negative; No changes in vision or hearing, sinus congestion, difficulty swallowing Pulmonary: Negative; No cough, wheezing, shortness of breath, hemoptysis  Cardiovascular: Negative; No chest pain, presyncope, syncope, palpitations GI: Negative; No nausea, vomiting, diarrhea, or abdominal pain GU: Negative; No dysuria, hematuria, or difficulty voiding Musculoskeletal: Right hip discomfort Hematologic/Oncology: Negative; no easy bruising, bleeding Endocrine: Negative; no heat/cold intolerance; no diabetes Neuro: Negative; no changes in balance, headaches Skin: Negative; No rashes or skin lesions Psychiatric: Negative; No behavioral problems, depression Sleep: Negative; No snoring, daytime sleepiness, hypersomnolence, bruxism, restless legs, hypnogognic hallucinations, no cataplexy Other comprehensive 14 point system review is negative.   PHYSICAL EXAM:   VS:  BP (!) 100/50   Pulse 74   Ht _0  (1.549 m)   Wt 194 lb 6.4 oz (88.2 kg)   BMI 36.73 kg/m     Repeat blood pressure by me was 106/62 supine and 110/64 standing.  Her pulse was consistently in the 60s without any bradycardia.  Wt Readings from Last 3 Encounters:  12/10/18 194 lb 6.4 oz (88.2 kg)  12/10/18 193 lb (87.5 kg)  12/08/18 193 lb (87.5 kg)    General: Alert, oriented, no distress.  Skin: normal turgor, no rashes, warm and dry HEENT: Normocephalic, atraumatic. Pupils equal round and reactive to light; sclera anicteric; extraocular muscles intact; Fundi without hemorrhages or exudates Nose without nasal septal hypertrophy Mouth/Parynx benign; Mallinpatti scale 3 Neck: No JVD, no carotid bruits; normal carotid  upstroke Lungs: clear to ausculatation and percussion; no wheezing or rales Chest wall: without tenderness to palpitation Heart: PMI not displaced, RRR, s1 s2 normal, 1/6 systolic murmur, no diastolic murmur, no rubs, gallops, thrills, or heaves Abdomen: soft, nontender; no hepatosplenomehaly, BS+; abdominal aorta nontender and not dilated by palpation. Back: no CVA tenderness Pulses 2+ Musculoskeletal: full range of motion, normal strength, no joint deformities Extremities: no clubbing cyanosis or edema, Homan's sign negative  Neurologic: grossly nonfocal; Cranial nerves grossly wnl Psychologic: Normal mood and affect   Studies/Labs Reviewed:   EKG:  EKG is ordered today.  ECG (independently read by me): Normal sinus rhythm at 66 with mild sinus arrhythmia and probable isolated PAC . PR interval 182 ms QTc interval 429 ms.  No ST segment changes.  Recent Labs: BMP Latest Ref Rng & Units 12/08/2018 11/21/2018 09/09/2018  Glucose 70 - 99 mg/dL 90 85 92  BUN 8 - 23 mg/dL 7(L) 13 14  Creatinine 0.44 - 1.00 mg/dL 0.71 0.71 0.72  BUN/Creat Ratio 12 - 28 - 18 -  Sodium 135 - 145 mmol/L 138 141 138  Potassium 3.5 - 5.1 mmol/L 3.9 4.4 4.0  Chloride 98 - 111 mmol/L 107 101 106  CO2 22 - 32 mmol/L _1 Calcium 8.9 - 10.3 mg/dL 8.9 9.2 9.0     Hepatic Function Latest Ref Rng & Units 12/08/2018 09/09/2018 08/13/2018  Total Protein 6.5 - 8.1 g/dL 7.3 7.5 7.0  Albumin 3.5 - 5.0 g/dL 3.6 3.4(L) 3.8  AST 15 - 41 U/L _2 ALT 0 - 44 U/L _3 Alk Phosphatase 38 - 126 U/L 74 72 128(H)  Total Bilirubin 0.3 - 1.2 mg/dL 1.1 1.1 0.6  Bilirubin, Direct 0.00 - 0.40 mg/dL - - 0.20    CBC Latest Ref Rng & Units 12/08/2018 09/09/2018 07/15/2018  WBC 4.0 - 10.5 K/uL 5.3 6.2 5.3  Hemoglobin 12.0 - 15.0 g/dL 14.1 13.5 13.5  Hematocrit 36.0 - 46.0 % 40.2 39.4 40.3  Platelets 150 - 400 K/uL 304 339 319   Lab Results  Component Value Date   MCV 81.7 12/08/2018  MCV 79.8 (L) 09/09/2018   MCV 82.4  07/15/2018   Lab Results  Component Value Date   TSH 0.454 07/04/2016   Lab Results  Component Value Date   HGBA1C 5.1 11/21/2018     BNP No results found for: BNP  ProBNP No results found for: PROBNP   Lipid Panel  No results found for: CHOL, TRIG, HDL, CHOLHDL, VLDL, LDLCALC, LDLDIRECT   RADIOLOGY: No results found.   Additional studies/ records that were reviewed today include:  I reviewed her prior preoperative risk assessment evaluations by Dr. Ellyn Hack in 2018 and Jory Sims, NP in 2019.  ------------------------------------------------------------------- 07/05/2017 ECHO Study Conclusions  - Left ventricle: The cavity size was normal. Systolic function was   normal. The estimated ejection fraction was in the range of 60%   to 65%. Wall motion was normal; there were no regional wall   motion abnormalities. Features are consistent with a pseudonormal   left ventricular filling pattern, with concomitant abnormal   relaxation and increased filling pressure (grade 2 diastolic   dysfunction). - Aortic valve: There was very mild stenosis. Valve area (VTI):   2.01 cm^2. Valve area (Vmax): 2.09 cm^2. Valve area (Vmean): 1.96   cm^2. - Mitral valve: Calcified annulus. - Left atrium: The atrium was mildly dilated. - Pulmonary arteries: Systolic pressure was mildly to moderately   increased. PA peak pressure: 46 mm Hg (S).   ASSESSMENT:    1. Pre-operative cardiovascular examination   2. Essential hypertension   3. Grade II diastolic dysfunction   4. Mild aortic stenosis     PLAN:  [name] is an 85 year old female who is scheduled to undergo elective right hip replacement surgery next week with Dr. Rodell Perna.  She denies any history of chest pain and remotely required laparoscopic cholecystectomy for symptomatic cholelithiasis.  She has documented normal systolic function with an EF of 60 to 65%.  She had very minimal aortic stenosis in 2017 with  a mean gradient of 10, peak gradient of 19, and valve area of 2.01 4 cm.  She denies any chest pain PND orthopnea.  She denies any significant dyspnea.  She has been maintained on metoprolol 25 mg once a day and takes 20 mg of furosemide I presume may have been for intermittent swelling.  Her blood pressure today is stable but low but she is not orthostatic.  Her ECG today shows sinus rhythm ventricular rate  66 with mild sinus arrhythmia.  She is asymptomatic.  She is followed by Dr. Michiel Sites and is been on Tapazole.  She does not have any signs of edema or CHF.  If blood pressure continues to be low it may be worthwhile to consider holding furosemide and take this only on an as-needed basis.  At present, I would continue her low-dose metoprolol succinate 25 mg as long as her resting pulse is in the 60s but this can be reduced to 12.5 mg if needed.  I believe she is low risk for her planned elective surgery and have given her surgical clearance today.  She should have a postoperative ECG and consider telemetry postop cardiac monitoring.  I have suggested follow-up Cardiologic evaluation with Dr. Ellyn Hack following her surgery.  Medication Adjustments/Labs and Tests Ordered: Current medicines are reviewed at length with the patient today.  Concerns regarding medicines are outlined above.  Medication changes, Labs and Tests ordered today are listed in the Patient Instructions below. Patient Instructions  Medication Instructions:  The current medical regimen is  effective;  continue present plan and medications.  If you need a refill on your cardiac medications before your next appointment, please call your pharmacy.    Follow-Up: At [facility], you and your health needs are our priority.  As part of our continuing mission to provide you with exceptional heart care, we have created designated Provider Care Teams.  These Care Teams include your primary Cardiologist (physician) and Advanced Practice  Providers (APPs -  Physician Assistants and Nurse Practitioners) who all work together to provide you with the care you need, when you need it. You will need a follow up appointment after surgery. Please call our office 2 months in advance to schedule this appointment.  You may see Glenetta Hew, MD or one of the following Advanced Practice Providers on your designated Care Team:   Rosaria Ferries, PA-C . Jory Sims, DNP, ANP        Signed, Shelva Majestic, MD  12/11/2018 3:52 PM    [facility] [address] [place], [place], [place]  [address] Phone: [phone]

## 2018-12-10 NOTE — Telephone Encounter (Signed)
° ° °  ° °  [facility] Pre-operative Risk Assessment    Request for surgical clearance:  1. What type of surgery is being performed? RIGHT TOTAL HIP ARTHROPLASTY-DIRECT ANTERIOR APPROACH  2. When is this surgery scheduled? 12/19/18  3. What type of clearance is required (medical clearance vs. Pharmacy clearance to hold med vs. Both)? BOTH  4. Are there any medications that need to be held prior to surgery and how long?   5. Practice name and name of physician performing surgery? DR MARK YATES  6. What is your office phone number [phone]   7.   What is your office fax number on EPIC  8.   Anesthesia type (None, local, MAC, general) ? [place] 12/10/2018, 11:54 AM  _________________________________________________________________   (provider comments below)

## 2018-12-10 NOTE — Telephone Encounter (Signed)
Will address clearance during office visit with Dr. Claiborne Billings later today.

## 2018-12-10 NOTE — H&P (View-Only) (Signed)
Send in refill of amoxicillin  Same as last month ucall thanks.

## 2018-12-11 ENCOUNTER — Encounter: Payer: Self-pay | Admitting: Cardiovascular Disease

## 2018-12-12 NOTE — Telephone Encounter (Signed)
Signed chart. Dr. Gabriel Carina in epic.

## 2018-12-15 ENCOUNTER — Ambulatory Visit (INDEPENDENT_AMBULATORY_CARE_PROVIDER_SITE_OTHER): Payer: Medicare Other | Admitting: Internal Medicine

## 2018-12-15 ENCOUNTER — Encounter: Payer: Self-pay | Admitting: Internal Medicine

## 2018-12-15 VITALS — BP 112/74 | HR 58 | Temp 98.0°F | Ht 60.6 in | Wt 191.8 lb

## 2018-12-15 DIAGNOSIS — I1310 Hypertensive heart and chronic kidney disease without heart failure, with stage 1 through stage 4 chronic kidney disease, or unspecified chronic kidney disease: Secondary | ICD-10-CM | POA: Insufficient documentation

## 2018-12-15 DIAGNOSIS — M1611 Unilateral primary osteoarthritis, right hip: Secondary | ICD-10-CM | POA: Diagnosis not present

## 2018-12-15 DIAGNOSIS — I129 Hypertensive chronic kidney disease with stage 1 through stage 4 chronic kidney disease, or unspecified chronic kidney disease: Secondary | ICD-10-CM

## 2018-12-15 DIAGNOSIS — N182 Chronic kidney disease, stage 2 (mild): Secondary | ICD-10-CM | POA: Diagnosis not present

## 2018-12-15 DIAGNOSIS — Z6836 Body mass index (BMI) 36.0-36.9, adult: Secondary | ICD-10-CM

## 2018-12-15 NOTE — Progress Notes (Signed)
Subjective:     Patient ID: [name] , female    DOB: 01/25/1933 , 85 y.o.   MRN: [id number]   Chief Complaint  Patient presents with  . Hypertension    HPI  Hypertension  This is a chronic problem. The current episode started more than 1 year ago. The problem has been gradually improving since onset. The problem is controlled. Pertinent negatives include no blurred vision, chest pain, palpitations or shortness of breath. Risk factors for coronary artery disease include dyslipidemia, sedentary lifestyle, obesity and post-menopausal state.   She reports compliance with meds.   Past Medical History:  Diagnosis Date  . Arthritis    left hip and back  . Bronchiectasis    isolated to RML; status post right middle lobe partial resection.  . Complication of anesthesia    hard to wake up  . Dyspnea   . Gall stones   . GERD (gastroesophageal reflux disease)    history   . H/O osteoporosis   . Hypertension   . Hyperthyroidism    endocrinologist - Dr. Chalmers Cater  . Pneumonia   . Yeast infection      Family History  Problem Relation Age of Onset  . Hypertension Mother   . CVA Mother 60  . Heart attack Sister   . Diabetes Sister   . Healthy Father   . Heart attack Brother   . Heart attack Brother      Current Outpatient Medications:  .  amoxicillin (AMOXIL) 500 MG tablet, Take 1 tablet (500 mg total) by mouth 2 (two) times daily., Disp: 14 tablet, Rfl: 0 .  diclofenac sodium (VOLTAREN) 1 % GEL, APPLY  2-4 GRAMS TO EACH KNEE  TOPICALLY UP TO  4 TIMES DAILY (Patient taking differently: Apply 2 g topically 4 (four) times daily as needed (joint pain). ), Disp: 2 Tube, Rfl: 1 .  furosemide (LASIX) 20 MG tablet, Take 20 mg by mouth daily.  , Disp: , Rfl:  .  lidocaine (LIDODERM) 5 %, Place 1 patch onto the skin daily as needed (for pain). Remove & Discard patch within 12 hours or as directed by MD, Disp: , Rfl:  .  methimazole (TAPAZOLE) 10 MG tablet, Take 10 mg by mouth 2 (two)  times daily. , Disp: , Rfl:  .  metoprolol succinate (TOPROL-XL) 25 MG 24 hr tablet, Take 25 mg by mouth at bedtime. , Disp: , Rfl:  .  potassium chloride SA (K-DUR,KLOR-CON) 20 MEQ tablet, Take 20 mEq by mouth daily. , Disp: , Rfl:    Allergies  Allergen Reactions  . Ibuprofen Other (See Comments)    hallucinations  . Naproxen Sodium Other (See Comments)    hallucinations     Review of Systems  Constitutional: Negative.   Eyes: Negative for blurred vision.  Respiratory: Negative.  Negative for shortness of breath.   Cardiovascular: Negative.  Negative for chest pain and palpitations.  Gastrointestinal: Negative.   Neurological: Negative.   Psychiatric/Behavioral: Negative.      Today's Vitals   12/15/18 1106  BP: 112/74  Pulse: (!) 58  Temp: 98 F (36.7 C)  TempSrc: Oral  Weight: 191 lb 12.8 oz (87 kg)  Height: 5' 0.6" (1.539 m)  PainSc: 3    Body mass index is 36.72 kg/m.   Objective:  Physical Exam Vitals signs and nursing note reviewed.  Constitutional:      Appearance: Normal appearance.  HENT:     Head: Normocephalic and atraumatic.  Cardiovascular:  Rate and Rhythm: Normal rate and regular rhythm.     Heart sounds: Normal heart sounds.  Pulmonary:     Effort: Pulmonary effort is normal.     Breath sounds: Normal breath sounds.  Skin:    General: Skin is warm.  Neurological:     General: No focal deficit present.     Mental Status: She is alert.  Psychiatric:        Mood and Affect: Mood normal.        Behavior: Behavior normal.         Assessment And Plan:     1. Hypertensive nephropathy  Well controlled. She will continue with current meds.   2. Chronic renal disease, stage II  Chronic, I will check a GFR, Cr today.   3. Class 2 severe obesity due to excess calories with serious comorbidity and body mass index (BMI) of 36.0 to 36.9 in adult [facility])  Importance of achieving optimal weight to decrease risk of cardiovascular disease and  cancers was discussed with the patient in full detail. She is encouraged to start slowly - start with 10 minutes twice daily at least three to four days per week and to gradually build to 30 minutes five days weekly. She was given tips to incorporate more activity into her daily routine - take stairs when possible, park farther away from her job, grocery stores, etc.    4. Primary osteoarthritis of right hip  She is scheduled for right hip replacement this Friday. She will increase exercise as tolerated, per physical therapy.   Maximino Greenland, MD

## 2018-12-15 NOTE — Care Plan (Signed)
RNCM met with patient in office during her H&P appointment with PA. Scheduled for R-Ant THA on 12/19/18 with Dr. Lorin Mercy. Patient verbalized she has a front wheeled walker, cane, and BSC at home. No current DME needs identified. Her daughter will be at home to assist after discharge from hospital. Patient aware that MD will make decision after surgery regarding any post-op discharge needs related to therapy. Patient today had a low resting heart rate while in office. PA recommended being seen by her Cardiology group to determine clearance for upcoming surgery. [facility] contacted by [facility] and patient seen on same day in afternoon. Clearance provided by that office for surgery. All [facility] pre-surgery surveys completed at office visit with patient. Will follow in hospital after upcoming surgery. Follow up with MD scheduled for 2 week post-op on 01/06/19 at 1:00 pm. For any questions, contact Jamse Arn, [place].

## 2018-12-15 NOTE — Patient Instructions (Signed)

## 2018-12-18 ENCOUNTER — Telehealth (INDEPENDENT_AMBULATORY_CARE_PROVIDER_SITE_OTHER): Payer: Self-pay | Admitting: *Deleted

## 2018-12-18 NOTE — Telephone Encounter (Signed)
Ortho bundle pre-surgery telephone call completed.

## 2018-12-18 NOTE — Care Plan (Signed)
RNCM called patient to check on her prior to upcoming surgery tomorrow, 12/19/18. She verbalized she is aware of all pre-op instructions. Reviewed all instructions given by hospital at pre-op appointment. RNCM will continue to be available as needed for any questions. Reminded of post-op office appointment scheduled with Dr. Lorin Mercy on 01/06/19 at 1:00 pm. MD will make decision on any discharge needs. Patient has FWW at home. Call with any questions. Jamse Arn, [facility] [phone].

## 2018-12-19 ENCOUNTER — Ambulatory Visit (HOSPITAL_COMMUNITY): Payer: Medicare Other | Admitting: Anesthesiology

## 2018-12-19 ENCOUNTER — Observation Stay (HOSPITAL_COMMUNITY): Payer: Medicare Other

## 2018-12-19 ENCOUNTER — Other Ambulatory Visit: Payer: Self-pay

## 2018-12-19 ENCOUNTER — Inpatient Hospital Stay (HOSPITAL_COMMUNITY)
Admission: RE | Admit: 2018-12-19 | Discharge: 2018-12-21 | DRG: 470 | Disposition: A | Discharge status: Home / Self Care | Payer: Medicare Other | Attending: Internal Medicine | Admitting: Internal Medicine

## 2018-12-19 ENCOUNTER — Encounter (HOSPITAL_COMMUNITY): Payer: Self-pay

## 2018-12-19 ENCOUNTER — Ambulatory Visit (HOSPITAL_COMMUNITY): Payer: Medicare Other | Admitting: Vascular Surgery

## 2018-12-19 ENCOUNTER — Ambulatory Visit (HOSPITAL_COMMUNITY): Payer: Medicare Other

## 2018-12-19 ENCOUNTER — Encounter (HOSPITAL_COMMUNITY)
Admission: RE | Disposition: A | Discharge status: Home / Self Care | Payer: Self-pay | Source: Home / Self Care | Attending: Orthopaedic Surgery

## 2018-12-19 DIAGNOSIS — Z886 Allergy status to analgesic agent status: Secondary | ICD-10-CM

## 2018-12-19 DIAGNOSIS — Z96642 Presence of left artificial hip joint: Secondary | ICD-10-CM | POA: Diagnosis present

## 2018-12-19 DIAGNOSIS — I129 Hypertensive chronic kidney disease with stage 1 through stage 4 chronic kidney disease, or unspecified chronic kidney disease: Secondary | ICD-10-CM | POA: Diagnosis not present

## 2018-12-19 DIAGNOSIS — Z9049 Acquired absence of other specified parts of digestive tract: Secondary | ICD-10-CM | POA: Diagnosis not present

## 2018-12-19 DIAGNOSIS — Z90710 Acquired absence of both cervix and uterus: Secondary | ICD-10-CM | POA: Diagnosis not present

## 2018-12-19 DIAGNOSIS — E0590 Thyrotoxicosis, unspecified without thyrotoxic crisis or storm: Secondary | ICD-10-CM | POA: Diagnosis present

## 2018-12-19 DIAGNOSIS — Z09 Encounter for follow-up examination after completed treatment for conditions other than malignant neoplasm: Secondary | ICD-10-CM

## 2018-12-19 DIAGNOSIS — E7800 Pure hypercholesterolemia, unspecified: Secondary | ICD-10-CM | POA: Diagnosis not present

## 2018-12-19 DIAGNOSIS — Z96641 Presence of right artificial hip joint: Secondary | ICD-10-CM | POA: Diagnosis not present

## 2018-12-19 DIAGNOSIS — K219 Gastro-esophageal reflux disease without esophagitis: Secondary | ICD-10-CM | POA: Diagnosis not present

## 2018-12-19 DIAGNOSIS — M810 Age-related osteoporosis without current pathological fracture: Secondary | ICD-10-CM | POA: Diagnosis present

## 2018-12-19 DIAGNOSIS — M1611 Unilateral primary osteoarthritis, right hip: Principal | ICD-10-CM | POA: Diagnosis present

## 2018-12-19 DIAGNOSIS — Z823 Family history of stroke: Secondary | ICD-10-CM

## 2018-12-19 DIAGNOSIS — Z79899 Other long term (current) drug therapy: Secondary | ICD-10-CM

## 2018-12-19 DIAGNOSIS — M1610 Unilateral primary osteoarthritis, unspecified hip: Secondary | ICD-10-CM | POA: Diagnosis present

## 2018-12-19 DIAGNOSIS — N182 Chronic kidney disease, stage 2 (mild): Secondary | ICD-10-CM | POA: Diagnosis not present

## 2018-12-19 DIAGNOSIS — I10 Essential (primary) hypertension: Secondary | ICD-10-CM | POA: Diagnosis present

## 2018-12-19 DIAGNOSIS — Z8249 Family history of ischemic heart disease and other diseases of the circulatory system: Secondary | ICD-10-CM | POA: Diagnosis not present

## 2018-12-19 DIAGNOSIS — Z471 Aftercare following joint replacement surgery: Secondary | ICD-10-CM | POA: Diagnosis not present

## 2018-12-19 DIAGNOSIS — Z833 Family history of diabetes mellitus: Secondary | ICD-10-CM

## 2018-12-19 DIAGNOSIS — Z419 Encounter for procedure for purposes other than remedying health state, unspecified: Secondary | ICD-10-CM

## 2018-12-19 HISTORY — PX: TOTAL HIP ARTHROPLASTY: SHX124

## 2018-12-19 LAB — CBC
HCT: 33.3 % — ABNORMAL LOW (ref 36.0–46.0)
Hemoglobin: 11.9 g/dL — ABNORMAL LOW (ref 12.0–15.0)
MCH: 29.1 pg (ref 26.0–34.0)
MCHC: 35.7 g/dL (ref 30.0–36.0)
MCV: 81.4 fL (ref 80.0–100.0)
Platelets: 242 10*3/uL (ref 150–400)
RBC: 4.09 MIL/uL (ref 3.87–5.11)
RDW: 13.8 % (ref 11.5–15.5)
WBC: 12 10*3/uL — ABNORMAL HIGH (ref 4.0–10.5)
nRBC: 0 % (ref 0.0–0.2)

## 2018-12-19 LAB — URINALYSIS, ROUTINE W REFLEX MICROSCOPIC
Bilirubin Urine: NEGATIVE
Glucose, UA: NEGATIVE mg/dL
Hgb urine dipstick: NEGATIVE
Ketones, ur: NEGATIVE mg/dL
Leukocytes,Ua: NEGATIVE
Nitrite: NEGATIVE
Protein, ur: NEGATIVE mg/dL
Specific Gravity, Urine: 1.009 (ref 1.005–1.030)
pH: 5 (ref 5.0–8.0)

## 2018-12-19 LAB — CREATININE, SERUM
Creatinine, Ser: 0.66 mg/dL (ref 0.44–1.00)
GFR calc Af Amer: >60 mL/min (ref >60)
GFR calc non Af Amer: >60 mL/min (ref >60)

## 2018-12-19 SURGERY — ARTHROPLASTY, HIP, TOTAL,POSTERIOR APPROACH
Anesthesia: General | Site: Hip | Laterality: Right

## 2018-12-19 MED ORDER — DEXAMETHASONE SODIUM PHOSPHATE 10 MG/ML IJ SOLN
INTRAMUSCULAR | Status: DC | PRN
  Administered 2018-12-19: 8 mg via INTRAVENOUS

## 2018-12-19 MED ORDER — PROPOFOL 10 MG/ML IV BOLUS
INTRAVENOUS | Status: DC | PRN
  Administered 2018-12-19: 10 mg via INTRAVENOUS

## 2018-12-19 MED ORDER — ENOXAPARIN SODIUM 40 MG/0.4ML ~~LOC~~ SOLN
40.0000 mg | SUBCUTANEOUS | Status: DC
  Administered 2018-12-20 – 2018-12-21 (×2): 40 mg via SUBCUTANEOUS
  Filled 2018-12-19 (×2): qty 0.4

## 2018-12-19 MED ORDER — METHIMAZOLE 10 MG PO TABS
10.0000 mg | ORAL_TABLET | Freq: Two times a day (BID) | ORAL | Status: DC
  Administered 2018-12-19 – 2018-12-21 (×4): 10 mg via ORAL
  Filled 2018-12-19 (×4): qty 1

## 2018-12-19 MED ORDER — OXYCODONE HCL 5 MG PO TABS
5.0000 mg | ORAL_TABLET | ORAL | Status: DC | PRN
  Administered 2018-12-19 – 2018-12-21 (×8): 5 mg via ORAL
  Filled 2018-12-19 (×7): qty 1

## 2018-12-19 MED ORDER — TRANEXAMIC ACID-NACL 1000-0.7 MG/100ML-% IV SOLN
INTRAVENOUS | Status: AC
  Filled 2018-12-19: qty 100

## 2018-12-19 MED ORDER — SUGAMMADEX SODIUM 200 MG/2ML IV SOLN
INTRAVENOUS | Status: DC | PRN
  Administered 2018-12-19: 200 mg via INTRAVENOUS

## 2018-12-19 MED ORDER — HYDROMORPHONE HCL 1 MG/ML IJ SOLN
0.5000 mg | INTRAMUSCULAR | Status: DC | PRN
  Administered 2018-12-19 – 2018-12-20 (×2): 0.5 mg via INTRAVENOUS
  Filled 2018-12-19: qty 1

## 2018-12-19 MED ORDER — METOCLOPRAMIDE HCL 5 MG/ML IJ SOLN: 5.0000 mg | Freq: Three times a day (TID) | INTRAMUSCULAR | Status: DC | PRN

## 2018-12-19 MED ORDER — SODIUM CHLORIDE 0.9 % IV SOLN
INTRAVENOUS | Status: DC
  Administered 2018-12-19: 13:00:00 via INTRAVENOUS

## 2018-12-19 MED ORDER — PROPOFOL 10 MG/ML IV BOLUS
INTRAVENOUS | Status: AC
  Filled 2018-12-19: qty 20

## 2018-12-19 MED ORDER — ACETAMINOPHEN 10 MG/ML IV SOLN
INTRAVENOUS | Status: AC
  Filled 2018-12-19: qty 100

## 2018-12-19 MED ORDER — POLYETHYLENE GLYCOL 3350 17 G PO PACK: 17.0000 g | PACK | Freq: Every day | ORAL | Status: DC | PRN

## 2018-12-19 MED ORDER — CEFAZOLIN SODIUM-DEXTROSE 2-4 GM/100ML-% IV SOLN
2.0000 g | INTRAVENOUS | Status: AC
  Administered 2018-12-19: 2 g via INTRAVENOUS

## 2018-12-19 MED ORDER — ROCURONIUM BROMIDE 10 MG/ML (PF) SYRINGE
PREFILLED_SYRINGE | INTRAVENOUS | Status: DC | PRN
  Administered 2018-12-19: 50 mg via INTRAVENOUS

## 2018-12-19 MED ORDER — GABAPENTIN 300 MG PO CAPS
ORAL_CAPSULE | ORAL | Status: AC
  Administered 2018-12-19: 300 mg via ORAL
  Filled 2018-12-19: qty 1

## 2018-12-19 MED ORDER — FENTANYL CITRATE (PF) 250 MCG/5ML IJ SOLN
INTRAMUSCULAR | Status: DC | PRN
  Administered 2018-12-19 (×2): 50 ug via INTRAVENOUS
  Administered 2018-12-19: 100 ug via INTRAVENOUS
  Administered 2018-12-19 (×2): 50 ug via INTRAVENOUS

## 2018-12-19 MED ORDER — METOPROLOL SUCCINATE ER 25 MG PO TB24
25.0000 mg | ORAL_TABLET | Freq: Every day | ORAL | Status: DC
  Filled 2018-12-19: qty 1

## 2018-12-19 MED ORDER — METHOCARBAMOL 500 MG PO TABS
500.0000 mg | ORAL_TABLET | Freq: Four times a day (QID) | ORAL | Status: DC | PRN
  Administered 2018-12-19 – 2018-12-21 (×4): 500 mg via ORAL
  Filled 2018-12-19 (×3): qty 1

## 2018-12-19 MED ORDER — LIDOCAINE 2% (20 MG/ML) 5 ML SYRINGE
INTRAMUSCULAR | Status: DC | PRN
  Administered 2018-12-19: 100 mg via INTRAVENOUS

## 2018-12-19 MED ORDER — ACETAMINOPHEN 10 MG/ML IV SOLN
INTRAVENOUS | Status: DC | PRN
  Administered 2018-12-19: 1000 mg via INTRAVENOUS

## 2018-12-19 MED ORDER — ONDANSETRON HCL 4 MG PO TABS: 4.0000 mg | ORAL_TABLET | Freq: Four times a day (QID) | ORAL | Status: DC | PRN

## 2018-12-19 MED ORDER — DOCUSATE SODIUM 100 MG PO CAPS
100.0000 mg | ORAL_CAPSULE | Freq: Two times a day (BID) | ORAL | Status: DC
  Administered 2018-12-20 – 2018-12-21 (×3): 100 mg via ORAL
  Filled 2018-12-19 (×4): qty 1

## 2018-12-19 MED ORDER — KETOROLAC TROMETHAMINE 15 MG/ML IJ SOLN: 15.0000 mg | Freq: Once | INTRAMUSCULAR | Status: DC

## 2018-12-19 MED ORDER — PHENOL 1.4 % MT LIQD: 1.0000 | OROMUCOSAL | Status: DC | PRN

## 2018-12-19 MED ORDER — FUROSEMIDE 20 MG PO TABS
20.0000 mg | ORAL_TABLET | Freq: Every day | ORAL | Status: DC
  Administered 2018-12-19 – 2018-12-21 (×3): 20 mg via ORAL
  Filled 2018-12-19 (×3): qty 1

## 2018-12-19 MED ORDER — METHOCARBAMOL 1000 MG/10ML IJ SOLN
500.0000 mg | Freq: Four times a day (QID) | INTRAVENOUS | Status: DC | PRN
  Filled 2018-12-19: qty 5

## 2018-12-19 MED ORDER — HYDROMORPHONE HCL 1 MG/ML IJ SOLN
INTRAMUSCULAR | Status: AC
  Filled 2018-12-19: qty 1

## 2018-12-19 MED ORDER — METOCLOPRAMIDE HCL 5 MG PO TABS: 5.0000 mg | ORAL_TABLET | Freq: Three times a day (TID) | ORAL | Status: DC | PRN

## 2018-12-19 MED ORDER — FENTANYL CITRATE (PF) 250 MCG/5ML IJ SOLN
INTRAMUSCULAR | Status: AC
  Filled 2018-12-19: qty 5

## 2018-12-19 MED ORDER — EPHEDRINE SULFATE 50 MG/ML IJ SOLN
INTRAMUSCULAR | Status: DC | PRN
  Administered 2018-12-19 (×4): 10 mg via INTRAVENOUS

## 2018-12-19 MED ORDER — ONDANSETRON HCL 4 MG/2ML IJ SOLN
INTRAMUSCULAR | Status: DC | PRN
  Administered 2018-12-19: 4 mg via INTRAVENOUS

## 2018-12-19 MED ORDER — LACTATED RINGERS IV SOLN
INTRAVENOUS | Status: DC | PRN
  Administered 2018-12-19 (×2): via INTRAVENOUS

## 2018-12-19 MED ORDER — GABAPENTIN 300 MG PO CAPS
300.0000 mg | ORAL_CAPSULE | Freq: Once | ORAL | Status: AC
  Administered 2018-12-19: 300 mg via ORAL

## 2018-12-19 MED ORDER — ACETAMINOPHEN 325 MG PO TABS
325.0000 mg | ORAL_TABLET | Freq: Four times a day (QID) | ORAL | Status: DC | PRN
  Administered 2018-12-20 – 2018-12-21 (×3): 650 mg via ORAL
  Filled 2018-12-19 (×3): qty 2

## 2018-12-19 MED ORDER — POTASSIUM CHLORIDE CRYS ER 20 MEQ PO TBCR
20.0000 meq | EXTENDED_RELEASE_TABLET | Freq: Every day | ORAL | Status: DC
  Administered 2018-12-19 – 2018-12-21 (×3): 20 meq via ORAL
  Filled 2018-12-19 (×3): qty 1

## 2018-12-19 MED ORDER — BUPIVACAINE HCL (PF) 0.25 % IJ SOLN
INTRAMUSCULAR | Status: AC
  Filled 2018-12-19: qty 30

## 2018-12-19 MED ORDER — OXYCODONE HCL 5 MG PO TABS
ORAL_TABLET | ORAL | Status: AC
  Filled 2018-12-19: qty 1

## 2018-12-19 MED ORDER — CHLORHEXIDINE GLUCONATE 4 % EX LIQD: 60.0000 mL | Freq: Once | CUTANEOUS | Status: DC

## 2018-12-19 MED ORDER — CEFAZOLIN SODIUM-DEXTROSE 1-4 GM/50ML-% IV SOLN
1.0000 g | Freq: Three times a day (TID) | INTRAVENOUS | Status: AC
  Administered 2018-12-19 – 2018-12-20 (×3): 1 g via INTRAVENOUS
  Filled 2018-12-19 (×3): qty 50

## 2018-12-19 MED ORDER — GABAPENTIN 300 MG PO CAPS: 300.0000 mg | ORAL_CAPSULE | Freq: Once | ORAL | Status: DC

## 2018-12-19 MED ORDER — METHOCARBAMOL 500 MG PO TABS
ORAL_TABLET | ORAL | Status: AC
  Filled 2018-12-19: qty 1

## 2018-12-19 MED ORDER — MENTHOL 3 MG MT LOZG: 1.0000 | LOZENGE | OROMUCOSAL | Status: DC | PRN

## 2018-12-19 MED ORDER — SODIUM CHLORIDE 0.9 % IR SOLN
Status: DC | PRN
  Administered 2018-12-19: 1000 mL

## 2018-12-19 MED ORDER — CEFAZOLIN SODIUM-DEXTROSE 2-4 GM/100ML-% IV SOLN
INTRAVENOUS | Status: AC
  Filled 2018-12-19: qty 100

## 2018-12-19 MED ORDER — NALOXONE HCL 0.4 MG/ML IJ SOLN
INTRAMUSCULAR | Status: AC
  Filled 2018-12-19: qty 1

## 2018-12-19 MED ORDER — ONDANSETRON HCL 4 MG/2ML IJ SOLN: 4.0000 mg | Freq: Four times a day (QID) | INTRAMUSCULAR | Status: DC | PRN

## 2018-12-19 MED ORDER — BUPIVACAINE HCL (PF) 0.25 % IJ SOLN
INTRAMUSCULAR | Status: DC | PRN
  Administered 2018-12-19: 20 mL

## 2018-12-19 MED ORDER — KETOROLAC TROMETHAMINE 15 MG/ML IJ SOLN
INTRAMUSCULAR | Status: AC
  Administered 2018-12-19: 15 mg
  Filled 2018-12-19: qty 1

## 2018-12-19 SURGICAL SUPPLY — 88 items
APL SKNCLS STERI-STRIP NONHPOA (GAUZE/BANDAGES/DRESSINGS) ×1
ARTICULEZE HEAD (Hips) ×2 IMPLANT
BENZOIN TINCTURE PRP APPL 2/3 (GAUZE/BANDAGES/DRESSINGS) ×2 IMPLANT
BLADE CLIPPER SURG (BLADE) IMPLANT
BLADE SAW SGTL 73X25 THK (BLADE) ×2 IMPLANT
BRUSH FEMORAL CANAL (MISCELLANEOUS) IMPLANT
CELLS DAT CNTRL 66122 CELL SVR (MISCELLANEOUS) ×1 IMPLANT
COVER BACK TABLE 24X17X13 BIG (DRAPES) ×2 IMPLANT
COVER WAND RF STERILE (DRAPES) ×2 IMPLANT
CUP PINN GRIPTON 54 100 (Cup) ×1 IMPLANT
DRAPE C-ARM 35X43 STRL (DRAPES) ×1 IMPLANT
DRAPE IMP U-DRAPE 54X76 (DRAPES) ×2 IMPLANT
DRAPE INCISE IOBAN 66X45 STRL (DRAPES) IMPLANT
DRAPE ORTHO SPLIT 77X108 STRL (DRAPES) ×4
DRAPE SURG ORHT 6 SPLT 77X108 (DRAPES) ×2 IMPLANT
DRAPE U-SHAPE 47X51 STRL (DRAPES) ×2 IMPLANT
DRILL BIT 7/64X5 (BIT) ×1 IMPLANT
DRSG MEPILEX BORDER 4X12 (GAUZE/BANDAGES/DRESSINGS) ×1 IMPLANT
DRSG PAD ABDOMINAL 8X10 ST (GAUZE/BANDAGES/DRESSINGS) ×2 IMPLANT
DURAPREP 26ML APPLICATOR (WOUND CARE) ×1 IMPLANT
ELECT BLADE 4.0 EZ CLEAN MEGAD (MISCELLANEOUS) ×2
ELECT CAUTERY BLADE 6.4 (BLADE) ×2 IMPLANT
ELECT REM PT RETURN 9FT ADLT (ELECTROSURGICAL) ×2
ELECTRODE BLDE 4.0 EZ CLN MEGD (MISCELLANEOUS) IMPLANT
ELECTRODE REM PT RTRN 9FT ADLT (ELECTROSURGICAL) ×1 IMPLANT
ELIMINATOR HOLE APEX DEPUY (Hips) ×1 IMPLANT
EVACUATOR 1/8 PVC DRAIN (DRAIN) IMPLANT
FACESHIELD WRAPAROUND (MASK) ×4 IMPLANT
FACESHIELD WRAPAROUND OR TEAM (MASK) ×2 IMPLANT
GAUZE SPONGE 4X4 12PLY STRL (GAUZE/BANDAGES/DRESSINGS) ×1 IMPLANT
GAUZE XEROFORM 5X9 LF (GAUZE/BANDAGES/DRESSINGS) ×1 IMPLANT
GLOVE BIOGEL PI IND STRL 8 (GLOVE) ×2 IMPLANT
GLOVE BIOGEL PI INDICATOR 8 (GLOVE) ×2
GLOVE INDICATOR 7.5 STRL GRN (GLOVE) ×2 IMPLANT
GLOVE ORTHO TXT STRL SZ7.5 (GLOVE) ×4 IMPLANT
GLOVE SURG SS PI 6.5 STRL IVOR (GLOVE) ×1 IMPLANT
GOWN STRL REUS W/ TWL LRG LVL3 (GOWN DISPOSABLE) ×1 IMPLANT
GOWN STRL REUS W/ TWL XL LVL3 (GOWN DISPOSABLE) ×1 IMPLANT
GOWN STRL REUS W/TWL 2XL LVL3 (GOWN DISPOSABLE) ×2 IMPLANT
GOWN STRL REUS W/TWL LRG LVL3 (GOWN DISPOSABLE) ×2
GOWN STRL REUS W/TWL XL LVL3 (GOWN DISPOSABLE) ×4
HANDPIECE INTERPULSE COAX TIP (DISPOSABLE)
HEAD ARTICULEZE (Hips) IMPLANT
IMMOBILIZER KNEE 20 (SOFTGOODS) IMPLANT
IMMOBILIZER KNEE 22 UNIV (SOFTGOODS) IMPLANT
IMMOBILIZER KNEE 24 THIGH 36 (MISCELLANEOUS) IMPLANT
IMMOBILIZER KNEE 24 UNIV (MISCELLANEOUS)
KIT BASIN OR (CUSTOM PROCEDURE TRAY) ×2 IMPLANT
KIT TURNOVER KIT B (KITS) ×2 IMPLANT
LINER NEUTRAL 54X36MM PLUS 4 (Hips) ×1 IMPLANT
MANIFOLD NEPTUNE II (INSTRUMENTS) ×2 IMPLANT
NDL 1/2 CIR MAYO (NEEDLE) ×1 IMPLANT
NDL HYPO 25GX1X1/2 BEV (NEEDLE) ×1 IMPLANT
NEEDLE 1/2 CIR MAYO (NEEDLE) ×2 IMPLANT
NEEDLE HYPO 25GX1X1/2 BEV (NEEDLE) ×2 IMPLANT
NEEDLE MAYO .5 CIRCLE (NEEDLE) ×2 IMPLANT
NS IRRIG 1000ML POUR BTL (IV SOLUTION) ×2 IMPLANT
PACK TOTAL JOINT (CUSTOM PROCEDURE TRAY) ×2 IMPLANT
PACK UNIVERSAL I (CUSTOM PROCEDURE TRAY) ×2 IMPLANT
PAD ARMBOARD 7.5X6 YLW CONV (MISCELLANEOUS) ×4 IMPLANT
PIN STEINMAN 3/16 (PIN) ×1 IMPLANT
PRESSURIZER FEMORAL UNIV (MISCELLANEOUS) IMPLANT
RETRACTOR WND ALEXIS 18 MED (MISCELLANEOUS) IMPLANT
RTRCTR WOUND ALEXIS 18CM MED (MISCELLANEOUS) ×2
SET HNDPC FAN SPRY TIP SCT (DISPOSABLE) IMPLANT
SLEEVE SUCTION 125 (MISCELLANEOUS) ×1 IMPLANT
SPONGE LAP 4X18 RFD (DISPOSABLE) ×4 IMPLANT
STAPLER VISISTAT 35W (STAPLE) ×2 IMPLANT
STEM FEMORAL SZ 6MM STD ACTIS (Stem) ×1 IMPLANT
STRIP CLOSURE SKIN 1/2X4 (GAUZE/BANDAGES/DRESSINGS) ×2 IMPLANT
SUCTION FRAZIER HANDLE 10FR (MISCELLANEOUS) ×1
SUCTION TUBE FRAZIER 10FR DISP (MISCELLANEOUS) ×1 IMPLANT
SUT ETHIBOND NAB CT1 #1 30IN (SUTURE) ×6 IMPLANT
SUT TICRON (SUTURE) ×2 IMPLANT
SUT VIC AB 0 CT1 27 (SUTURE)
SUT VIC AB 0 CT1 27XBRD ANBCTR (SUTURE) IMPLANT
SUT VIC AB 2-0 CT1 27 (SUTURE) ×4
SUT VIC AB 2-0 CT1 TAPERPNT 27 (SUTURE) ×2 IMPLANT
SUT VICRYL 0 TIES 12 18 (SUTURE) IMPLANT
SUT VICRYL 4-0 PS2 18IN ABS (SUTURE) ×2 IMPLANT
SUT VLOC 180 0 24IN GS25 (SUTURE) ×1 IMPLANT
SYR CONTROL 10ML LL (SYRINGE) ×2 IMPLANT
TOWEL OR 17X24 6PK STRL BLUE (TOWEL DISPOSABLE) ×2 IMPLANT
TOWEL OR 17X26 10 PK STRL BLUE (TOWEL DISPOSABLE) ×2 IMPLANT
TOWER CARTRIDGE SMART MIX (DISPOSABLE) IMPLANT
TRAY CATH 16FR W/PLASTIC CATH (SET/KITS/TRAYS/PACK) IMPLANT
TRAY FOLEY MTR SLVR 16FR STAT (SET/KITS/TRAYS/PACK) IMPLANT
WATER STERILE IRR 1000ML POUR (IV SOLUTION) ×4 IMPLANT

## 2018-12-19 NOTE — Op Note (Signed)
Preop diagnosis: Right primary hip osteoarthritis  Postop diagnosis: Same  Procedure: Right total hip arthroplasty direct anterior approach.  Surgeon: Rodell Perna, MD  Assistant: Benjiman Core, PA-C medically necessary and present for bone preparation and implantation and closure.  Second assistant: April Fulp RNFA present for initial exposure  Anesthesia : General anesthesia plus local.  TXA.  Implants: Depuy encryption 54 mm series 100 cup.  +4 neutral polyliner.  Size 6 Actis 6 mm standard femoral stem with +5 mm metal ball.  EBL: Less than 200 cc  Procedure: After induction of general anesthesia preoperative TXA Ancef prophylaxis usual positioning with Hana table Hana boots C-arm visualization before the procedure standard prep and drape was performed after 1015 drapes.  SICU drapes were then placed sterile skin marker large shower curtain Betadine Steri-Drape half sheet across and crush sheet above.  Hole was cut for the hydraulic arm protected at the base with a strip of Xeroform.  Timeout procedure was completed.  Direct anterior approach was made.  Pocket was made underneath the skin skin protector was used fascia was nicked extended opened.  Transverse vessels were coagulated with the Bovie anterior capsule was excised.  Neck was cut under C arm visualization after code was placed above and below the neck.  Neck was cut about 1 fingerbreadth above the lesser trochanter.  Hydraulic hook was applied leg was taken down and under and then preparation of the acetabulum by excising the labrum progressive reaming up to 5354 cup was placed with appropriate abduction and anteversion checked under fluoroscopy and then impacted to level seated secure.  Central was placed +4 neutral liner repeat impaction and then preparation the femur progressing up to the size 6 stem which gave excellent fill.  Trials were tried off the broach first and then permanent stem was inserted with the +5 which restored leg  length since her opposite hip is been done and the arthritic right hip is gotten shortened from articular surface wear.  Line drawn across the ischial tuberosity at the same spot on the trochanter for right and left.  The permanent implants were stable extension to 45 degrees external rotation 90 degrees there was no shock.  Repeat irrigation final look showed no arterial bleeders transverse arterial bleeders were re-coagulated one last time and then V lock closure deep fascia 2 oh and subtenons tissue skin staple closure postop dressing and transfer the cover him.

## 2018-12-19 NOTE — Progress Notes (Signed)
Orthopedic Tech Progress Note Patient Details:  [name] 04/07/1933 [id number] Put up OHF for the patient. But the RN/TECH has to move the patient over to the bed, because the bed she's in right now has the old frame on the back. Patient ID: [name], female   DOB: [birth year], 85 y.o.   MRN: [id number]   Janit Pagan 12/19/2018, 12:05 PM

## 2018-12-19 NOTE — Plan of Care (Signed)

## 2018-12-19 NOTE — Anesthesia Postprocedure Evaluation (Signed)
Anesthesia Post Note  Patient: [name]  Procedure(s) Performed: RIGHT TOTAL HIP ARTHROPLASTY-DIRECT ANTERIOR APPROACH (Right Hip)     Patient location during evaluation: PACU Anesthesia Type: General Level of consciousness: awake and alert Pain management: pain level controlled Vital Signs Assessment: post-procedure vital signs reviewed and stable Respiratory status: spontaneous breathing, nonlabored ventilation, respiratory function stable and patient connected to nasal cannula oxygen Cardiovascular status: blood pressure returned to baseline and stable Postop Assessment: no apparent nausea or vomiting Anesthetic complications: no    Last Vitals:  Vitals:   12/19/18 1108 12/19/18 1146  BP: (!) 101/53 111/63  Pulse: (!) 44 (!) 47  Resp: 11 16  Temp:    SpO2: 99% 100%    Last Pain:  Vitals:   12/19/18 1100  TempSrc:   PainSc: 4                  Barnet Glasgow

## 2018-12-19 NOTE — Transfer of Care (Signed)
Immediate Anesthesia Transfer of Care Note  Patient: [name]  Procedure(s) Performed: RIGHT TOTAL HIP ARTHROPLASTY-DIRECT ANTERIOR APPROACH (Right Hip)  Patient Location: PACU  Anesthesia Type:General  Level of Consciousness: awake, alert  and oriented  Airway & Oxygen Therapy: Patient Spontanous Breathing, Patient connected to nasal cannula oxygen and Patient connected to face mask oxygen  Post-op Assessment: Report given to RN, Post -op Vital signs reviewed and stable and Patient moving all extremities X 4  Post vital signs: Reviewed and stable  Last Vitals:  Vitals Value Taken Time  BP 117/62 12/19/2018  9:54 AM  Temp    Pulse 63 12/19/2018 10:04 AM  Resp 20 12/19/2018 10:04 AM  SpO2 100 % 12/19/2018 10:04 AM  Vitals shown include unvalidated device data.  Last Pain:  Vitals:   12/19/18 0621  TempSrc:   PainSc: 4       Patients Stated Pain Goal: 2 (09/32/[birth year] 5732)  Complications: No apparent anesthesia complications

## 2018-12-19 NOTE — Plan of Care (Signed)
  Problem: Activity: Goal: Risk for activity intolerance will decrease Outcome: Progressing   Problem: Nutrition: Goal: Adequate nutrition will be maintained Outcome: Progressing   Problem: Coping: Goal: Level of anxiety will decrease Outcome: Progressing   Problem: Elimination: Goal: Will not experience complications related to urinary retention Outcome: Progressing   Problem: Pain Managment: Goal: General experience of comfort will improve Outcome: Progressing   Problem: Safety: Goal: Ability to remain free from injury will improve Outcome: Progressing   

## 2018-12-19 NOTE — Progress Notes (Signed)
Physical Therapy Evaluation Patient Details Name: [name] MRN: [id number] DOB: 1933/04/11 Today's Date: 12/19/2018   History of Present Illness  Patient is 85 y/o female s/p R THA. PMH includes osteoporosis, HTN, bronchiectasis, and GERD.   Clinical Impression  Patient admitted to hospital secondary to problems above and with deficits below. Patient required min-modA to stand and perform transfers x2 with RW. Educated and reviewed supine HEP with patient and family. Patient will benefit from acute physical therapy to maximize independence and safety with functional mobility.     Follow Up Recommendations Follow surgeon's recommendation for DC plan and follow-up therapies    Equipment Recommendations  None recommended by PT    Recommendations for Other Services       Precautions / Restrictions Precautions Precautions: Fall Restrictions Weight Bearing Restrictions: Yes RLE Weight Bearing: Weight bearing as tolerated      Mobility  Bed Mobility Overal bed mobility: Needs Assistance Bed Mobility: Supine to Sit     Supine to sit: Min assist     General bed mobility comments: Patient required minA for RLE management to sit EOB. Mild dizziness reported upon sitting EOB that resolved.  Transfers Overall transfer level: Needs assistance Equipment used: Rolling walker (2 wheeled) Transfers: Sit to/from [facility] Sit to Stand: Min assist;Mod assist Stand pivot transfers: Min assist       General transfer comment: Patient required modA for lift assit to stand and min A to transfer to [facility] with RW. Verbal cues for hand placement when using RW. Required minA for lift assit for second stand and transfer to recliner with RW. Verbal cues for sequencing with RW.  Ambulation/Gait                Stairs            Wheelchair Mobility    Modified Rankin (Stroke Patients Only)       Balance Overall balance assessment: Needs  assistance Sitting-balance support: No upper extremity supported;Feet supported Sitting balance-Leahy Scale: Good     Standing balance support: Bilateral upper extremity supported Standing balance-Leahy Scale: Poor Standing balance comment: reliant on BUE support to maintain standing balance                             Pertinent Vitals/Pain Pain Assessment: 0-10 Pain Score: 6  Pain Location: R hip Pain Descriptors / Indicators: Aching;Grimacing;Guarding;Operative site guarding Pain Intervention(s): Limited activity within patient's tolerance;Monitored during session    Home Living Family/patient expects to be discharged to:: Private residence Living Arrangements: Alone Available Help at Discharge: Family;Available 24 hours/day Type of Home: House Home Access: Ramped entrance     Home Layout: One level;Other (Comment)(has 1 step up into bed) Home Equipment: Walker - 2 wheels;Walker - 4 wheels;Bedside commode;Shower seat;Tub bench;Cane - single point      Prior Function Level of Independence: Independent with assistive device(s)         Comments: Uses a walker and cane for mobility     Hand Dominance        Extremity/Trunk Assessment   Upper Extremity Assessment Upper Extremity Assessment: Overall WFL for tasks assessed    Lower Extremity Assessment Lower Extremity Assessment: RLE deficits/detail RLE Deficits / Details: RLE deficits consistent with post op pain and weakness    Cervical / Trunk Assessment Cervical / Trunk Assessment: Normal  Communication   Communication: No difficulties  Cognition Arousal/Alertness: Awake/alert Behavior During Therapy: WFL for  tasks assessed/performed Overall Cognitive Status: Within Functional Limits for tasks assessed                                        General Comments General comments (skin integrity, edema, etc.): patient daughter in room during session    Exercises Total Joint  Exercises Ankle Circles/Pumps: AROM;Both;20 reps;Supine Quad Sets: AROM;Right;10 reps;Supine Heel Slides: AROM;Right;10 reps;Supine   Assessment/Plan    PT Assessment Patient needs continued PT services  PT Problem List Decreased strength;Decreased range of motion;Decreased activity tolerance;Decreased balance;Decreased mobility;Decreased knowledge of use of DME;Decreased knowledge of precautions;Pain       PT Treatment Interventions DME instruction;Gait training;Stair training;Functional mobility training;Therapeutic activities;Therapeutic exercise;Balance training;Patient/family education    PT Goals (Current goals can be found in the Care Plan section)  Acute Rehab PT Goals Patient Stated Goal: go home PT Goal Formulation: With patient Time For Goal Achievement: 01/02/19 Potential to Achieve Goals: Good    Frequency 7X/week   Barriers to discharge        Co-evaluation               AM-PAC PT "6 Clicks" Mobility  Outcome Measure Help needed turning from your back to your side while in a flat bed without using bedrails?: None Help needed moving from lying on your back to sitting on the side of a flat bed without using bedrails?: A Little Help needed moving to and from a bed to a chair (including a wheelchair)?: A Little Help needed standing up from a chair using your arms (e.g., wheelchair or bedside chair)?: A Lot Help needed to walk in hospital room?: A Lot Help needed climbing 3-5 steps with a railing? : A Lot 6 Click Score: 16    End of Session Equipment Utilized During Treatment: Gait belt Activity Tolerance: Patient tolerated treatment well Patient left: in chair;with call bell/phone within reach;with family/visitor present Nurse Communication: Mobility status PT Visit Diagnosis: Other abnormalities of gait and mobility (R26.89);Muscle weakness (generalized) (M62.81);Difficulty in walking, not elsewhere classified (R26.2);Pain Pain - Right/Left: Right Pain -  part of body: Hip    Time: 9147-8295 PT Time Calculation (min) (ACUTE ONLY): 31 min   Charges:   PT Evaluation $PT Eval Low Complexity: 1 Low PT Treatments $Therapeutic Activity: 8-22 mins        Erick Blinks, SPT  Erick Blinks 12/19/2018, 6:12 PM

## 2018-12-19 NOTE — Care Plan (Signed)
RNCM met with patient in office prior to her scheduled R-Ant THA on 12/19/18. She is in the Ortho bundle program. All pre-surgery surveys have been completed. Patient has all needed post-op equipment at home. MD to make decision on whether HHPT will be needed at discharge. [facility] will be following patient in hospital in case HHPT is needed. Patient aware and given choice.  Please call with any questions.  Jamse Arn, [place] [phone]

## 2018-12-19 NOTE — Anesthesia Procedure Notes (Signed)
Procedure Name: Intubation Date/Time: 12/19/2018 7:41 AM Performed by: Mariea Clonts, CRNA Pre-anesthesia Checklist: Patient identified, Emergency Drugs available, Suction available and Patient being monitored Patient Re-evaluated:Patient Re-evaluated prior to induction Oxygen Delivery Method: Circle System Utilized Preoxygenation: Pre-oxygenation with 100% oxygen Induction Type: IV induction Ventilation: Mask ventilation without difficulty Laryngoscope Size: Miller and 2 Grade View: Grade I Tube type: Oral Tube size: 7.0 mm Number of attempts: 1 Airway Equipment and Method: Stylet and Oral airway Placement Confirmation: ETT inserted through vocal cords under direct vision,  positive ETCO2 and breath sounds checked- equal and bilateral Tube secured with: Tape Dental Injury: Teeth and Oropharynx as per pre-operative assessment

## 2018-12-19 NOTE — Interval H&P Note (Signed)
History and Physical Interval Note:  12/19/2018 7:23 AM  [name]  has presented today for surgery, with the diagnosis of right hip osteoarthritis.  The various methods of treatment have been discussed with the patient and family. After consideration of risks, benefits and other options for treatment, the patient has consented to  Procedure(s): RIGHT TOTAL HIP ARTHROPLASTY-DIRECT ANTERIOR APPROACH (Right) as a surgical intervention.  The patient's history has been reviewed, patient examined, no change in status, stable for surgery.  I have reviewed the patient's chart and labs.  Questions were answered to the patient's satisfaction.     [name]

## 2018-12-19 NOTE — H&P (Signed)
Patient is admitted for right total hip arthroplasty.  Subjective:  Chief Complaint: right hip pain  HPI: [name], 85 y.o. female, has a history of pain and functional disability in the right hip(s) due to arthritis and patient has failed non-surgical conservative treatments for greater than 12 weeks to include NSAID's and/or analgesics, use of assistive devices and activity modification.  Onset of symptoms was gradual starting 10 years ago with gradually worsening course since that time. Patient currently rates pain in the right hip at 10 out of 10 with activity. Patient has worsening of pain with activity and weight bearing, trendelenberg gait, pain that interfers with activities of daily living, pain with passive range of motion and crepitus. Patient has evidence of subchondral sclerosis, periarticular osteophytes and joint space narrowing by imaging studies. This condition presents safety issues increasing the risk of falls. There is no current active infection.       Patient Active Problem List   Diagnosis Date Noted  . H/O total hip arthroplasty, left 11/07/2017  . Post-traumatic osteoarthritis of left knee 11/07/2017  . Unilateral primary osteoarthritis, right knee 11/07/2017  . Unilateral primary osteoarthritis, right hip 08/06/2017  . Presence of left artificial hip joint 05/14/2017  . Preop cardiovascular exam 03/12/2017  . Cough   . Abdominal pain   . Symptomatic cholelithiasis 07/01/2016  . Nonspecific ST-T wave electrocardiographic changes   . Lower leg edema 02/16/2015  . Obesity (BMI 30.0-34.9) 02/16/2015  . Osteoporosis 01/11/2012  . Essential hypertension 01/11/2012  . Hyperthyroidism 01/11/2012  . Hypercholesteremia 01/11/2012  . H/O vaginal hysterectomy 1974 01/11/2012    Class: History of  . S/P removal of lung  partial R lobe  2010 01/11/2012  . History of hernia repair  R ing. 1975 01/11/2012  . BACK PAIN 11/12/2007  . DYSPNEA 11/11/2007        Past Medical History:  Diagnosis Date  . Arthritis    left hip and back  . Bronchiectasis    isolated to RML; status post right middle lobe partial resection.  . Complication of anesthesia    hard to wake up  . Dyspnea   . Gall stones   . GERD (gastroesophageal reflux disease)    history   . H/O osteoporosis   . Hypertension   . Hyperthyroidism    endocrinologist - Dr. Chalmers Cater  . Pneumonia   . Yeast infection          Past Surgical History:  Procedure Laterality Date  . ABDOMINAL HYSTERECTOMY  1974  . CHOLECYSTECTOMY N/A 07/02/2016   Procedure: LAPAROSCOPIC CHOLECYSTECTOMY;  Surgeon: Stark Klein, MD;  Location: [facility];  Service: General;  Laterality: N/A;  . COLONOSCOPY    . ENDOSCOPIC RETROGRADE CHOLANGIOPANCREATOGRAPHY (ERCP) WITH PROPOFOL N/A 08/07/2018   Procedure: ENDOSCOPIC RETROGRADE CHOLANGIOPANCREATOGRAPHY (ERCP) WITH PROPOFOL;  Surgeon: Carol Ada, MD;  Location: [facility];  Service: Endoscopy;  Laterality: N/A;  . EYE SURGERY     stye removed  . HERNIA REPAIR  1975  . KNEE SURGERY Left   . LUNG REMOVAL, PARTIAL  208   RML  . NM MYOVIEW LTD  06/2011   Normal EF. No ischemia or infarction.  [name]  08/07/2018   Procedure: [name];  Surgeon: Carol Ada, MD;  Location: [facility];  Service: Endoscopy;;  balloon sweep  . TOTAL HIP ARTHROPLASTY Left 04/05/2017   Procedure: LEFT TOTAL HIP ARTHROPLASTY ANTERIOR APPROACH;  Surgeon: [name], MD;  Location: [facility];  Service: Orthopedics;  Laterality: Left;  .  TRANSTHORACIC ECHOCARDIOGRAM  06/2011   Mild concentric LVH. Normal EF with impaired relaxation. Mildly elevated RV pressures of 30 and 40 mmHg.  Marland Kitchen TRANSTHORACIC ECHOCARDIOGRAM  06/2016   normal LV size and function. EF 60-65% with GRD 2 DD. Mild aortic stenosis. Mild LA dilation. Mild to moderately increased PA pressures (46 mmHg).  . TUBAL LIGATION      No current facility-administered  medications for this encounter.           Current Outpatient Medications  Medication Sig Dispense Refill Last Dose  . diclofenac sodium (VOLTAREN) 1 % GEL APPLY  2-4 GRAMS TO EACH KNEE  TOPICALLY UP TO  4 TIMES DAILY (Patient taking differently: Apply 1 application topically 3 (three) times daily as needed (for pain). ) 2 Tube 1 Taking  . furosemide (LASIX) 20 MG tablet Take 20 mg by mouth daily.     Taking  . lidocaine (LIDODERM) 5 % Place 1 patch onto the skin daily as needed (for pain). Remove & Discard patch within 12 hours or as directed by MD     . methimazole (TAPAZOLE) 5 MG tablet Take 2.5 mg by mouth daily.     . metoprolol succinate (TOPROL-XL) 25 MG 24 hr tablet Take 25 mg by mouth at bedtime.    Taking  . potassium chloride SA (K-DUR,KLOR-CON) 20 MEQ tablet Take 20 mEq by mouth daily.    Taking   Allergies  Allergen Reactions  . Ibuprofen Other (See Comments)    hallucinations  . Naproxen Sodium Other (See Comments)    hallucinations    Social History       Tobacco Use  . Smoking status: Never Smoker  . Smokeless tobacco: Never Used  Substance Use Topics  . Alcohol use: No         Family History  Problem Relation Age of Onset  . Hypertension Mother   . CVA Mother 37  . Heart attack Sister   . Diabetes Sister   . Heart attack Brother   . Heart attack Brother      Review of Systems  Constitutional: Negative.   HENT: Negative.   Respiratory: Negative.   Cardiovascular: Negative.   Genitourinary: Negative.   Musculoskeletal: Positive for joint pain.  Skin: Negative.   Neurological: Negative.     Objective:  Physical Exam  Constitutional: She is oriented to person, place, and time. She appears well-developed. No distress.  HENT:  Head: Normocephalic.  Eyes: Pupils are equal, round, and reactive to light.  Neck: Normal range of motion.  Musculoskeletal:        General: Tenderness present.  Neurological: She is alert and  oriented to person, place, and time.  Skin: Skin is warm and dry.  Psychiatric: She has a normal mood and affect.    Vital signs in last 24 hours: Weight:  [84.3 kg] 84.3 kg (12/12 1525)  Labs:   Estimated body mass index is 30.93 kg/m as calculated from the following:   Height as of this encounter: 5\' 5"  (1.651 m).   Weight as of this encounter: 84.3 kg.   Imaging Review Plain radiographs demonstrate moderate degenerative joint disease of the right hip(s). The bone quality appears to be good for age and reported activity level.    Preoperative templating of the joint replacement has been completed, documented, and submitted to the Operating Room personnel in order to optimize intra-operative equipment management.     Assessment/Plan:  End stage arthritis, right hip(s)  The patient history,  physical examination, clinical judgement of the provider and imaging studies are consistent with end stage degenerative joint disease of the right hip(s) and total hip arthroplasty is deemed medically necessary. The treatment options including medical management, injection therapy, arthroscopy and arthroplasty were discussed at length. The risks and benefits of total hip arthroplasty were presented and reviewed. The risks due to aseptic loosening, infection, stiffness, dislocation/subluxation,  thromboembolic complications and other imponderables were discussed.  The patient acknowledged the explanation, agreed to proceed with the plan and consent was signed. Patient is being admitted for inpatient treatment for surgery, pain control, PT, OT, prophylactic antibiotics, VTE prophylaxis, progressive ambulation and ADL's and discharge planning.The patient is planning to be discharged home with home health services

## 2018-12-20 DIAGNOSIS — M1611 Unilateral primary osteoarthritis, right hip: Secondary | ICD-10-CM | POA: Diagnosis present

## 2018-12-20 DIAGNOSIS — Z79899 Other long term (current) drug therapy: Secondary | ICD-10-CM | POA: Diagnosis not present

## 2018-12-20 DIAGNOSIS — E0590 Thyrotoxicosis, unspecified without thyrotoxic crisis or storm: Secondary | ICD-10-CM | POA: Diagnosis present

## 2018-12-20 DIAGNOSIS — Z8249 Family history of ischemic heart disease and other diseases of the circulatory system: Secondary | ICD-10-CM | POA: Diagnosis not present

## 2018-12-20 DIAGNOSIS — Z833 Family history of diabetes mellitus: Secondary | ICD-10-CM | POA: Diagnosis not present

## 2018-12-20 DIAGNOSIS — Z823 Family history of stroke: Secondary | ICD-10-CM | POA: Diagnosis not present

## 2018-12-20 DIAGNOSIS — M810 Age-related osteoporosis without current pathological fracture: Secondary | ICD-10-CM | POA: Diagnosis present

## 2018-12-20 DIAGNOSIS — M1610 Unilateral primary osteoarthritis, unspecified hip: Secondary | ICD-10-CM | POA: Diagnosis present

## 2018-12-20 DIAGNOSIS — Z96642 Presence of left artificial hip joint: Secondary | ICD-10-CM | POA: Diagnosis present

## 2018-12-20 DIAGNOSIS — Z9049 Acquired absence of other specified parts of digestive tract: Secondary | ICD-10-CM | POA: Diagnosis not present

## 2018-12-20 DIAGNOSIS — I10 Essential (primary) hypertension: Secondary | ICD-10-CM | POA: Diagnosis present

## 2018-12-20 DIAGNOSIS — Z886 Allergy status to analgesic agent status: Secondary | ICD-10-CM | POA: Diagnosis not present

## 2018-12-20 DIAGNOSIS — Z90710 Acquired absence of both cervix and uterus: Secondary | ICD-10-CM | POA: Diagnosis not present

## 2018-12-20 DIAGNOSIS — K219 Gastro-esophageal reflux disease without esophagitis: Secondary | ICD-10-CM | POA: Diagnosis present

## 2018-12-20 LAB — CBC
HCT: 27.5 % — ABNORMAL LOW (ref 36.0–46.0)
Hemoglobin: 9.6 g/dL — ABNORMAL LOW (ref 12.0–15.0)
MCH: 28.3 pg (ref 26.0–34.0)
MCHC: 34.9 g/dL (ref 30.0–36.0)
MCV: 81.1 fL (ref 80.0–100.0)
Platelets: 215 10*3/uL (ref 150–400)
RBC: 3.39 MIL/uL — ABNORMAL LOW (ref 3.87–5.11)
RDW: 13.6 % (ref 11.5–15.5)
WBC: 9.2 10*3/uL (ref 4.0–10.5)
nRBC: 0 % (ref 0.0–0.2)

## 2018-12-20 LAB — BASIC METABOLIC PANEL
Anion gap: 9 (ref 5–15)
BUN: 8 mg/dL (ref 8–23)
CO2: 23 mmol/L (ref 22–32)
Calcium: 8.2 mg/dL — ABNORMAL LOW (ref 8.9–10.3)
Chloride: 105 mmol/L (ref 98–111)
Creatinine, Ser: 0.68 mg/dL (ref 0.44–1.00)
GFR calc Af Amer: >60 mL/min (ref >60)
GFR calc non Af Amer: >60 mL/min (ref >60)
Glucose, Bld: 124 mg/dL — ABNORMAL HIGH (ref 70–99)
Potassium: 3.9 mmol/L (ref 3.5–5.1)
Sodium: 137 mmol/L (ref 135–145)

## 2018-12-20 NOTE — Progress Notes (Signed)
Physical Therapy Treatment Patient Details Name: [name] MRN: [id number] DOB: 10/20/1932 Today's Date: 12/20/2018    History of Present Illness Patient is 85 y/o female s/p R THA. PMH includes osteoporosis, HTN, bronchiectasis, and GERD.     PT Comments    Pt is progressing well towards goals. She ambulated into hall for the first time today with min guard for safety. Overall steady with RW, though gait is slow and guarded. Plan to continue progressing gait and HEP next session.   Follow Up Recommendations  Follow surgeon's recommendation for DC plan and follow-up therapies     Equipment Recommendations  None recommended by PT    Recommendations for Other Services       Precautions / Restrictions Precautions Precautions: Fall Restrictions Weight Bearing Restrictions: Yes RLE Weight Bearing: Weight bearing as tolerated    Mobility  Bed Mobility               General bed mobility comments: In chair on arrival  Transfers Overall transfer level: Needs assistance Equipment used: Rolling walker (2 wheeled) Transfers: Sit to/from Stand Sit to Stand: Min assist         General transfer comment: Pt slightly impulsive, standing on her own from recliner chair when PTA stepped out of room. On return to chair pt with poor eccentric control.  Ambulation/Gait Ambulation/Gait assistance: Min guard Gait Distance (Feet): 150 Feet Assistive device: Rolling walker (2 wheeled) Gait Pattern/deviations: Step-through pattern;Antalgic;Trunk flexed Gait velocity: decreased   General Gait Details: Pt with mildly antalgic slow gait. VC for postural control. Good step through patten.   Stairs             Wheelchair Mobility    Modified Rankin (Stroke Patients Only)       Balance Overall balance assessment: Needs assistance Sitting-balance support: No upper extremity supported;Feet supported Sitting balance-Leahy Scale: Good     Standing balance support:  Single extremity supported Standing balance-Leahy Scale: Poor Standing balance comment: reliant on support to maintain standing balance                            Cognition Arousal/Alertness: Awake/alert Behavior During Therapy: WFL for tasks assessed/performed Overall Cognitive Status: Within Functional Limits for tasks assessed                                        Exercises Total Joint Exercises Short Arc Quad: AROM;Right;10 reps;Seated Hip ABduction/ADduction: AROM;Right;10 reps;Seated Long Arc Quad: AROM;Right;10 reps;Seated    General Comments General comments (skin integrity, edema, etc.): pts daughter present during session      Pertinent Vitals/Pain Pain Assessment: 0-10 Pain Score: 6  Pain Location: R hip Pain Descriptors / Indicators: Aching;Grimacing;Guarding;Operative site guarding    Home Living                      Prior Function            PT Goals (current goals can now be found in the care plan section) Acute Rehab PT Goals Patient Stated Goal: go home PT Goal Formulation: With patient Time For Goal Achievement: 01/02/19 Potential to Achieve Goals: Good Progress towards PT goals: Progressing toward goals    Frequency    7X/week      PT Plan Current plan remains appropriate    Co-evaluation  AM-PAC PT "6 Clicks" Mobility   Outcome Measure  Help needed turning from your back to your side while in a flat bed without using bedrails?: None Help needed moving from lying on your back to sitting on the side of a flat bed without using bedrails?: A Little Help needed moving to and from a bed to a chair (including a wheelchair)?: A Little Help needed standing up from a chair using your arms (e.g., wheelchair or bedside chair)?: A Little Help needed to walk in hospital room?: A Little Help needed climbing 3-5 steps with a railing? : A Lot 6 Click Score: 18    End of Session Equipment  Utilized During Treatment: Gait belt Activity Tolerance: Patient tolerated treatment well Patient left: in chair;with call bell/phone within reach;with family/visitor present Nurse Communication: Mobility status PT Visit Diagnosis: Other abnormalities of gait and mobility (R26.89);Muscle weakness (generalized) (M62.81);Difficulty in walking, not elsewhere classified (R26.2);Pain Pain - Right/Left: Right Pain - part of body: Hip     Time: 8719-5974 PT Time Calculation (min) (ACUTE ONLY): 32 min  Charges:  $Gait Training: 8-22 mins $Therapeutic Exercise: 8-22 mins                     Benjiman Core, [place] Pager [phone] Acute Rehab    Allena Katz 12/20/2018, 11:54 AM

## 2018-12-20 NOTE — Progress Notes (Signed)
Pt reports increased pain and stiffness this PM limiting ambulation distance. Completed HEP training. Plan to practice 1 step next session prior to d/c.   Benjiman Core, [place] Pager [phone] Acute Rehab    12/20/18 1450  PT Visit Information  Last PT Received On 12/20/18  Assistance Needed +1  History of Present Illness Patient is 85 y/o female s/p R THA. PMH includes osteoporosis, HTN, bronchiectasis, and GERD.   Subjective Data  Patient Stated Goal go home  Precautions  Precautions Fall  Restrictions  Weight Bearing Restrictions Yes  RLE Weight Bearing WBAT  Pain Assessment  Pain Assessment 0-10  Pain Score 7  Pain Location R hip  Pain Descriptors / Indicators Aching;Grimacing;Guarding;Operative site guarding  Pain Intervention(s) Monitored during session;Limited activity within patient's tolerance;RN gave pain meds during session  Cognition  Arousal/Alertness Awake/alert  Behavior During Therapy [facility] for tasks assessed/performed  Overall Cognitive Status Within Functional Limits for tasks assessed  Bed Mobility  Overal bed mobility Needs Assistance  Bed Mobility Supine to Sit  Supine to sit Min assist  General bed mobility comments min A for LE managemant on and off bed.  Transfers  Overall transfer level Needs assistance  Equipment used Rolling walker (2 wheeled)  Transfers Sit to/from Stand  Sit to Stand Min guard  General transfer comment min guard for safety to rise from EOB and BSC. Pt requires increased time and effort.  Ambulation/Gait  Ambulation/Gait assistance Min guard  Gait Distance (Feet) 75 Feet  Assistive device Rolling walker (2 wheeled)  Gait Pattern/deviations Step-through pattern;Antalgic;Trunk flexed;Decreased step length - left;Decreased stance time - right  General Gait Details Pt's gait more stiff and guarded this session secondary to pain. VC for heel strike and postural control.  Gait velocity decreased  Balance  Overall balance assessment Needs  assistance  Sitting-balance support No upper extremity supported;Feet supported  Sitting balance-Leahy Scale Good  Standing balance support Single extremity supported  Standing balance-Leahy Scale Poor  Standing balance comment reliant on support to maintain standing balance  Exercises  Exercises Total Joint  Total Joint Exercises  Hip ABduction/ADduction AROM;Right;10 reps;Standing  Knee Flexion AROM;Right;10 reps;Standing  Marching in Standing AROM;Right;10 reps;Standing  Standing Hip Extension AROM;Right;10 reps;Standing  PT - End of Session  Equipment Utilized During Treatment Gait belt  Activity Tolerance Patient tolerated treatment well  Patient left with family/visitor present;in bed;with call bell/phone within reach  Nurse Communication Mobility status;Patient requests pain meds   PT - Assessment/Plan  PT Plan Current plan remains appropriate  PT Visit Diagnosis Other abnormalities of gait and mobility (R26.89);Muscle weakness (generalized) (M62.81);Difficulty in walking, not elsewhere classified (R26.2);Pain  Pain - Right/Left Right  Pain - part of body Hip  PT Frequency (ACUTE ONLY) 7X/week  Follow Up Recommendations Follow surgeon's recommendation for DC plan and follow-up therapies  PT equipment None recommended by PT  AM-PAC PT "6 Clicks" Mobility Outcome Measure (Version 2)  Help needed turning from your back to your side while in a flat bed without using bedrails? 4  Help needed moving from lying on your back to sitting on the side of a flat bed without using bedrails? 3  Help needed moving to and from a bed to a chair (including a wheelchair)? 3  Help needed standing up from a chair using your arms (e.g., wheelchair or bedside chair)? 3  Help needed to walk in hospital room? 3  Help needed climbing 3-5 steps with a railing?  3  6 Click Score 19  Consider Recommendation  of Discharge To: Home with [facility]  PT Goal Progression  Progress towards PT goals Progressing toward  goals  Acute Rehab PT Goals  PT Goal Formulation With patient  Time For Goal Achievement 01/02/19  Potential to Achieve Goals Good  PT Time Calculation  PT Start Time (ACUTE ONLY) 1421  PT Stop Time (ACUTE ONLY) 1448  PT Time Calculation (min) (ACUTE ONLY) 27 min  PT General Charges  $$ ACUTE PT VISIT 1 Visit  PT Treatments  $Gait Training 8-22 mins  $Therapeutic Exercise 8-22 mins

## 2018-12-20 NOTE — Progress Notes (Signed)
   Subjective: 1 Day Post-Op Procedure(s) (LRB): RIGHT TOTAL HIP ARTHROPLASTY-DIRECT ANTERIOR APPROACH (Right) Patient reports pain as moderate.    Objective: Vital signs in last 24 hours: Temp:  [97.5 F (36.4 C)-98.7 F (37.1 C)] 97.7 F (36.5 C) (03/14 0821) Pulse Rate:  [51-62] 51 (03/14 0821) Resp:  [14] 14 (03/13 2018) BP: (94-102)/(48-66) 94/48 (03/14 0821) SpO2:  [94 %-97 %] 97 % (03/14 0821)  Intake/Output from previous day: 03/13 0701 - 03/14 0700 In: 1794.4 [I.V.:1694.4; IV Piggyback:100] Out: 450 [Blood:450] Intake/Output this shift: No intake/output data recorded.  Recent Labs    12/19/18 1204 12/20/18 0352  HGB 11.9* 9.6*   Recent Labs    12/19/18 1204 12/20/18 0352  WBC 12.0* 9.2  RBC 4.09 3.39*  HCT 33.3* 27.5*  PLT 242 215   Recent Labs    12/19/18 1204 12/20/18 0352  NA  --  137  K  --  3.9  CL  --  105  CO2  --  23  BUN  --  8  CREATININE 0.66 0.68  GLUCOSE  --  124*  CALCIUM  --  8.2*   No results for input(s): LABPT, INR in the last 72 hours.  Neurologically intact  LL equal  No results found.  Assessment/Plan: 1 Day Post-Op Procedure(s) (LRB): RIGHT TOTAL HIP ARTHROPLASTY-DIRECT ANTERIOR APPROACH (Right) Up with therapy, Home Sunday  . No HHPT needed.   [name] 12/20/2018, 12:47 PM

## 2018-12-21 LAB — CBC
HCT: 24.5 % — ABNORMAL LOW (ref 36.0–46.0)
Hemoglobin: 8.5 g/dL — ABNORMAL LOW (ref 12.0–15.0)
MCH: 28 pg (ref 26.0–34.0)
MCHC: 34.7 g/dL (ref 30.0–36.0)
MCV: 80.6 fL (ref 80.0–100.0)
Platelets: 181 K/uL (ref 150–400)
RBC: 3.04 MIL/uL — ABNORMAL LOW (ref 3.87–5.11)
RDW: 13.5 % (ref 11.5–15.5)
WBC: 6.5 K/uL (ref 4.0–10.5)
nRBC: 0 % (ref 0.0–0.2)

## 2018-12-21 LAB — URINE CULTURE: Culture: <10000 — AB

## 2018-12-21 MED ORDER — ASPIRIN 325 MG PO TABS: 325.0000 mg | ORAL_TABLET | Freq: Every day | ORAL | Status: DC

## 2018-12-21 MED ORDER — OXYCODONE-ACETAMINOPHEN 5-325 MG PO TABS: 1.0000 | ORAL_TABLET | ORAL | 0 refills | Status: DC | PRN

## 2018-12-21 MED ORDER — METHOCARBAMOL 500 MG PO TABS: 500.0000 mg | ORAL_TABLET | Freq: Four times a day (QID) | ORAL | 1 refills | Status: DC | PRN

## 2018-12-21 NOTE — Care Management (Signed)
Youth walker ordered per PT recommendation.  Will be delivered to room prior to d/c.

## 2018-12-21 NOTE — Progress Notes (Signed)
Pt given discharge instructions and gone over with her and daughter present. All questions answered. All belongings gathered to be taken home. Youth walker delivered to room.

## 2018-12-21 NOTE — Progress Notes (Signed)
   Subjective: 2 Days Post-Op Procedure(s) (LRB): RIGHT TOTAL HIP ARTHROPLASTY-DIRECT ANTERIOR APPROACH (Right) Patient reports pain as mild and moderate.    Objective: Vital signs in last 24 hours: Temp:  [97.9 F (36.6 C)-98.9 F (37.2 C)] 98.9 F (37.2 C) (03/15 0437) Pulse Rate:  [53-86] 86 (03/15 0437) BP: (92-101)/(55-56) 94/55 (03/15 0437) SpO2:  [97 %-100 %] 100 % (03/15 0437)  Intake/Output from previous day: 03/14 0701 - 03/15 0700 In: 660 [P.O.:660] Out: -  Intake/Output this shift: Total I/O In: 360 [P.O.:360] Out: -   Recent Labs    12/19/18 1204 12/20/18 0352 12/21/18 0518  HGB 11.9* 9.6* 8.5*   Recent Labs    12/20/18 0352 12/21/18 0518  WBC 9.2 6.5  RBC 3.39* 3.04*  HCT 27.5* 24.5*  PLT 215 181   Recent Labs    12/19/18 1204 12/20/18 0352  NA  --  137  K  --  3.9  CL  --  105  CO2  --  23  BUN  --  8  CREATININE 0.66 0.68  GLUCOSE  --  124*  CALCIUM  --  8.2*   No results for input(s): LABPT, INR in the last 72 hours.  Neurologically intact No results found.  Assessment/Plan: 2 Days Post-Op Procedure(s) (LRB): RIGHT TOTAL HIP ARTHROPLASTY-DIRECT ANTERIOR APPROACH (Right) Plan:discharge home office one week  [name] 12/21/2018, 11:37 AM

## 2018-12-21 NOTE — Progress Notes (Signed)
Physical Therapy Treatment Patient Details Name: [name] MRN: [id number] DOB: 31-Mar-1933 Today's Date: 12/21/2018    History of Present Illness Patient is 85 y/o female s/p R THA. PMH includes osteoporosis, HTN, bronchiectasis, and GERD.     PT Comments    Continuing work on functional mobility and activity tolerance;  Ms. Stock reports stiffness R hip throughout session, encouraged her to walk to help with the stiffness; stair training done and discussed car transfer; will plan to go over therex next session  Follow Up Recommendations  Follow surgeon's recommendation for DC plan and follow-up therapies     Equipment Recommendations  Rolling walker with 5" wheels(Youth-sized)    Recommendations for Other Services       Precautions / Restrictions Precautions Precautions: Fall Restrictions Weight Bearing Restrictions: Yes RLE Weight Bearing: Weight bearing as tolerated    Mobility  Bed Mobility                  Transfers Overall transfer level: Needs assistance Equipment used: Rolling walker (2 wheeled) Transfers: Sit to/from Stand Sit to Stand: Min guard         General transfer comment: min guard for safety to rise   Ambulation/Gait Ambulation/Gait assistance: Min guard Gait Distance (Feet): 120 Feet Assistive device: Rolling walker (2 wheeled) Gait Pattern/deviations: Step-through pattern;Antalgic;Trunk flexed;Decreased step length - left;Decreased stance time - right     General Gait Details: Cues for incr heel strike and to activate gluteals and quad in single limb stance RLE; RW sized for better fit -- recommend youth-sized RW because her UEs beacame fatigued during walk -- also encouraged more work of LEs    Stairs Stairs: Yes Stairs assistance: Min guard Stair Management: No rails;Forwards;Backwards;With walker Number of Stairs: 1(1xforward and 1xbackward) General stair comments: Cues for sequence and technqiue; slow, but managed without  physical assist   Wheelchair Mobility    Modified Rankin (Stroke Patients Only)       Balance     Sitting balance-Leahy Scale: Good       Standing balance-Leahy Scale: Poor(approaching Fair) Standing balance comment: reliant on support to maintain standing balance                            Cognition Arousal/Alertness: Awake/alert Behavior During Therapy: WFL for tasks assessed/performed Overall Cognitive Status: Within Functional Limits for tasks assessed                                        Exercises      General Comments        Pertinent Vitals/Pain Pain Assessment: Faces Faces Pain Scale: Hurts little more Pain Location: R hip Pain Descriptors / Indicators: Tightness("stiffness") Pain Intervention(s): Monitored during session    Home Living                      Prior Function            PT Goals (current goals can now be found in the care plan section) Acute Rehab PT Goals Patient Stated Goal: go home PT Goal Formulation: With patient Time For Goal Achievement: 01/02/19 Potential to Achieve Goals: Good Progress towards PT goals: Progressing toward goals    Frequency    7X/week      PT Plan Equipment recommendations need to be updated  Co-evaluation              AM-PAC PT "6 Clicks" Mobility   Outcome Measure  Help needed turning from your back to your side while in a flat bed without using bedrails?: None Help needed moving from lying on your back to sitting on the side of a flat bed without using bedrails?: A Little Help needed moving to and from a bed to a chair (including a wheelchair)?: A Little Help needed standing up from a chair using your arms (e.g., wheelchair or bedside chair)?: A Little Help needed to walk in hospital room?: A Little Help needed climbing 3-5 steps with a railing? : A Little 6 Click Score: 19    End of Session Equipment Utilized During Treatment: Gait  belt Activity Tolerance: Patient tolerated treatment well Patient left: in chair;with call bell/phone within reach Nurse Communication: Mobility status;Other (comment)(would like youth-sized RW) PT Visit Diagnosis: Other abnormalities of gait and mobility (R26.89);Muscle weakness (generalized) (M62.81);Difficulty in walking, not elsewhere classified (R26.2);Pain Pain - Right/Left: Right Pain - part of body: Hip     Time: 6950-7225 PT Time Calculation (min) (ACUTE ONLY): 34 min  Charges:  $Gait Training: 23-37 mins                     Roney Marion, [place]  [place] Pager [phone] Office [place] 12/21/2018, 10:08 AM

## 2018-12-21 NOTE — Discharge Instructions (Signed)

## 2018-12-21 NOTE — Progress Notes (Signed)
Physical Therapy Treatment Patient Details Name: [name] MRN: [id number] DOB: Mar 17, 1933 Today's Date: 12/21/2018    History of Present Illness Patient is 85 y/o female s/p R THA. PMH includes osteoporosis, HTN, bronchiectasis, and GERD.     PT Comments    Continuing work on functional mobility and activity tolerance; Pain was better managed this session, with notable better posture and step length; Questions solicited and answered; OK for dc home from PT standpoint    Follow Up Recommendations  Follow surgeon's recommendation for DC plan and follow-up therapies     Equipment Recommendations  Rolling walker with 5" wheels(Youth-sized)    Recommendations for Other Services       Precautions / Restrictions Precautions Precautions: Fall Precaution Comments: Fall risk greatly reduced with use of RW Restrictions RLE Weight Bearing: Weight bearing as tolerated    Mobility  Bed Mobility Overal bed mobility: Needs Assistance Bed Mobility: Supine to Sit     Supine to sit: Min guard     General bed mobility comments: Used a belt to assist RLE off of the bed  Transfers Overall transfer level: Needs assistance Equipment used: Rolling walker (2 wheeled) Transfers: Sit to/from Stand Sit to Stand: Supervision         General transfer comment: Cues for hand placement  Ambulation/Gait Ambulation/Gait assistance: Min guard Gait Distance (Feet): 120 Feet Assistive device: Rolling walker (2 wheeled) Gait Pattern/deviations: Step-through pattern;Antalgic;Trunk flexed;Decreased step length - left;Decreased stance time - right Gait velocity: decreased   General Gait Details: Cues for incr heel strike and to activate gluteals and quad in single limb stance RLE; RW sized for better fit -- recommend youth-sized RW because her UEs beacame fatigued during walk -- also encouraged more work of LEs    Stairs             Wheelchair Mobility    Modified Rankin (Stroke  Patients Only)       Balance     Sitting balance-Leahy Scale: Good       Standing balance-Leahy Scale: Fair                              Cognition Arousal/Alertness: Awake/alert Behavior During Therapy: WFL for tasks assessed/performed Overall Cognitive Status: Within Functional Limits for tasks assessed                                        Exercises Total Joint Exercises Quad Sets: AROM;Right;10 reps;Supine Gluteal Sets: AROM;Both;10 reps Towel Squeeze: AROM;Both;10 reps Short Arc Quad: AROM;Right;10 reps Heel Slides: AROM;Right;10 reps;Supine Hip ABduction/ADduction: AROM;Right;10 reps;Standing    General Comments        Pertinent Vitals/Pain Pain Assessment: Faces Faces Pain Scale: Hurts a little bit Pain Location: R hip Pain Descriptors / Indicators: Grimacing;Discomfort Pain Intervention(s): Premedicated before session    Home Living                      Prior Function            PT Goals (current goals can now be found in the care plan section) Acute Rehab PT Goals Patient Stated Goal: go home PT Goal Formulation: With patient Time For Goal Achievement: 01/02/19 Potential to Achieve Goals: Good Progress towards PT goals: Progressing toward goals    Frequency    7X/week  PT Plan Current plan remains appropriate    Co-evaluation              AM-PAC PT "6 Clicks" Mobility   Outcome Measure  Help needed turning from your back to your side while in a flat bed without using bedrails?: None Help needed moving from lying on your back to sitting on the side of a flat bed without using bedrails?: A Little Help needed moving to and from a bed to a chair (including a wheelchair)?: A Little Help needed standing up from a chair using your arms (e.g., wheelchair or bedside chair)?: A Little Help needed to walk in hospital room?: A Little Help needed climbing 3-5 steps with a railing? : A Little 6 Click  Score: 19    End of Session Equipment Utilized During Treatment: Gait belt Activity Tolerance: Patient tolerated treatment well Patient left: in chair;with call bell/phone within reach Nurse Communication: Mobility status(ok for dc) PT Visit Diagnosis: Other abnormalities of gait and mobility (R26.89);Muscle weakness (generalized) (M62.81);Difficulty in walking, not elsewhere classified (R26.2);Pain Pain - Right/Left: Right Pain - part of body: Hip     Time: 1282-0813 PT Time Calculation (min) (ACUTE ONLY): 33 min  Charges:  $Gait Training: 8-22 mins $Therapeutic Exercise: 8-22 mins                     Roney Marion, PT  Acute Rehabilitation Services Pager [phone] Office [place] 12/21/2018, 4:01 PM

## 2018-12-21 NOTE — Progress Notes (Signed)
Bandage changed per MD order. Pt tolerated well.

## 2018-12-22 ENCOUNTER — Encounter (HOSPITAL_COMMUNITY): Payer: Self-pay | Admitting: Orthopaedic Surgery

## 2018-12-22 ENCOUNTER — Telehealth (INDEPENDENT_AMBULATORY_CARE_PROVIDER_SITE_OTHER): Payer: Self-pay | Admitting: *Deleted

## 2018-12-22 NOTE — Telephone Encounter (Signed)
Ortho Bundle post-discharge home call completed.

## 2018-12-22 NOTE — Care Plan (Signed)
RNCM called patient to check on her after discharge from hospital over the weekend. 12/19/18 S/P R-Ant THA per Dr. Lorin Mercy. Patient states she is currently doing well. Able to get up and down on her own at home. MD determined no discharge needs and patient verbalized she is good with this. Reminded of follow up appointment with MD in 2 weeks on 01/06/19. Reminded of contact number for RNCM if any needs.

## 2018-12-26 NOTE — Discharge Summary (Signed)
Patient ID: [name] MRN: [id number] DOB/AGE: July 03, 1933 85 y.o.  Admit date: 12/19/2018 Discharge date: 12/26/2018  Admission Diagnoses:  Active Problems:   Arthritis of right hip   Arthritis of hip   Discharge Diagnoses:  Active Problems:   Arthritis of right hip   Arthritis of hip  status post Procedure(s): RIGHT TOTAL HIP ARTHROPLASTY-DIRECT ANTERIOR APPROACH  Past Medical History:  Diagnosis Date  . Arthritis    left hip and back  . Bronchiectasis    isolated to RML; status post right middle lobe partial resection.  . Complication of anesthesia    hard to wake up  . Dyspnea   . Gall stones   . GERD (gastroesophageal reflux disease)    history   . H/O osteoporosis   . Hypertension   . Hyperthyroidism    endocrinologist - Dr. Chalmers Cater  . Pneumonia   . Yeast infection     Surgeries: Procedure(s): RIGHT TOTAL HIP ARTHROPLASTY-DIRECT ANTERIOR APPROACH on 12/19/2018   Consultants:   Discharged Condition: Improved  Hospital Course: [name] is an 85 y.o. female who was admitted 12/19/2018 for operative treatment of right hip arthritis. Patient failed conservative treatments (please see the history and physical for the specifics) and had severe unremitting pain that affects sleep, daily activities and work/hobbies. After pre-op clearance, the patient was taken to the operating room on 12/19/2018 and underwent  Procedure(s): RIGHT TOTAL HIP ARTHROPLASTY-DIRECT ANTERIOR APPROACH.    Patient was given perioperative antibiotics:  Anti-infectives (From admission, onward)   Start     Dose/Rate Route Frequency Ordered Stop   12/19/18 1600  ceFAZolin (ANCEF) IVPB 1 g/50 mL premix     1 g 100 mL/hr over 30 Minutes Intravenous Every 8 hours 12/19/18 1142 12/20/18 0825   12/19/18 0615  ceFAZolin (ANCEF) IVPB 2g/100 mL premix     2 g 200 mL/hr over 30 Minutes Intravenous On call to O.R. 12/19/18 7416 12/19/18 0744   12/19/18 0615  ceFAZolin (ANCEF) 2-4 GM/100ML-%  IVPB    Note to Pharmacy:  Cordelia Pen   : cabinet override      12/19/18 0615 12/19/18 0744       Patient was given sequential compression devices and early ambulation to prevent DVT.   Patient benefited maximally from hospital stay and there were no complications. At the time of discharge, the patient was urinating/moving their bowels without difficulty, tolerating a regular diet, pain is controlled with oral pain medications and they have been cleared by PT/OT.   Recent vital signs: No data found.   Recent laboratory studies: No results for input(s): WBC, HGB, HCT, PLT, NA, K, CL, CO2, BUN, CREATININE, GLUCOSE, INR, CALCIUM in the last 72 hours.  Invalid input(s): PT, 2   Discharge Medications:   Allergies as of 12/21/2018      Reactions   Ibuprofen Other (See Comments)   hallucinations   Naproxen Sodium Other (See Comments)   hallucinations      Medication List    STOP taking these medications   amoxicillin 500 MG tablet Commonly known as:  AMOXIL   diclofenac sodium 1 % Gel Commonly known as:  VOLTAREN   lidocaine 5 % Commonly known as:  LIDODERM     TAKE these medications   aspirin 325 MG tablet Commonly known as:  Bayer Aspirin Take 1 tablet (325 mg total) by mouth daily.   furosemide 20 MG tablet Commonly known as:  LASIX Take 20 mg by mouth daily.  methimazole 10 MG tablet Commonly known as:  TAPAZOLE Take 10 mg by mouth 2 (two) times daily.   methocarbamol 500 MG tablet Commonly known as:  ROBAXIN Take 1 tablet (500 mg total) by mouth every 6 (six) hours as needed for muscle spasms.   metoprolol succinate 25 MG 24 hr tablet Commonly known as:  TOPROL-XL Take 25 mg by mouth at bedtime.   oxyCODONE-acetaminophen 5-325 MG tablet Commonly known as:  Percocet Take 1-2 tablets by mouth every 4 (four) hours as needed for severe pain.   potassium chloride SA 20 MEQ tablet Commonly known as:  K-DUR,KLOR-CON Take 20 mEq by mouth daily.        Diagnostic Studies: Dg C-arm 1-60 Min  Result Date: 12/19/2018 CLINICAL DATA:  Right total hip arthroplasty. EXAM: OPERATIVE right HIP (WITH PELVIS IF PERFORMED) 2 VIEWS TECHNIQUE: Fluoroscopic spot image(s) were submitted for interpretation post-operatively. Radiation exposure index: 1.93 mGy. COMPARISON:  None. FINDINGS: Two intraoperative fluoroscopic images of the right hip demonstrate the femoral and acetabular components to be well situated. Expected postoperative changes are seen in the surrounding soft tissues. IMPRESSION: Status post right total hip arthroplasty. Electronically Signed   By: Marijo Conception, M.D.   On: 12/19/2018 09:30   Dg Hip Port Unilat With Pelvis 1v Right  Result Date: 12/19/2018 CLINICAL DATA:  Right hip arthroplasty EXAM: DG HIP (WITH OR WITHOUT PELVIS) 1V PORT RIGHT COMPARISON:  05/20/2018 FINDINGS: Right hip replacement in satisfactory position alignment. No fracture or complication. Pre-existing left hip replacement also satisfactory in appearance. IMPRESSION: Satisfactory right hip replacement. Electronically Signed   By: Franchot Gallo M.D.   On: 12/19/2018 11:36   Dg Hip Operative Unilat W Or W/o Pelvis Right  Result Date: 12/19/2018 CLINICAL DATA:  Right total hip arthroplasty. EXAM: OPERATIVE right HIP (WITH PELVIS IF PERFORMED) 2 VIEWS TECHNIQUE: Fluoroscopic spot image(s) were submitted for interpretation post-operatively. Radiation exposure index: 1.93 mGy. COMPARISON:  None. FINDINGS: Two intraoperative fluoroscopic images of the right hip demonstrate the femoral and acetabular components to be well situated. Expected postoperative changes are seen in the surrounding soft tissues. IMPRESSION: Status post right total hip arthroplasty. Electronically Signed   By: Marijo Conception, M.D.   On: 12/19/2018 09:30      Follow-up Information    [name], MD. Go in 1 week(s).   Specialty:  Orthopedic Surgery Contact information: [place] [address] [phone]           Discharge Plan:  discharge to home Disposition:     Signed: Benjiman Core for Rodell Perna MD 12/26/2018, 11:09 AM

## 2018-12-30 ENCOUNTER — Telehealth (INDEPENDENT_AMBULATORY_CARE_PROVIDER_SITE_OTHER): Payer: Self-pay | Admitting: *Deleted

## 2018-12-30 NOTE — Telephone Encounter (Signed)
Ortho bundle 1 week phone call completed. No current concerns. Reminded of post-op appointment.

## 2019-01-02 ENCOUNTER — Other Ambulatory Visit (INDEPENDENT_AMBULATORY_CARE_PROVIDER_SITE_OTHER): Payer: Self-pay | Admitting: Physician Assistant

## 2019-01-02 ENCOUNTER — Telehealth (INDEPENDENT_AMBULATORY_CARE_PROVIDER_SITE_OTHER): Payer: Self-pay | Admitting: Orthopaedic Surgery

## 2019-01-02 DIAGNOSIS — M1611 Unilateral primary osteoarthritis, right hip: Secondary | ICD-10-CM

## 2019-01-02 MED ORDER — OXYCODONE-ACETAMINOPHEN 5-325 MG PO TABS: 1.0000 | ORAL_TABLET | ORAL | 0 refills | Status: DC | PRN

## 2019-01-02 NOTE — Telephone Encounter (Signed)
rx sent. Patient S/P THA 12/24/2018.

## 2019-01-02 NOTE — Telephone Encounter (Signed)
Called and sw pt to advise that rx has been sent to pharmacy.

## 2019-01-02 NOTE — Telephone Encounter (Signed)
Patient called wanting to know if Dr. Lorin Mercy could call her in enough pain medication to last until her appointment on Tuesday, she also wanted to know what else she could take if he is not able to prescribe her the pain medication today.  CB#[phone].  Thank you.

## 2019-01-02 NOTE — Telephone Encounter (Signed)
Will you please review message below and advise for Dr. Lorin Mercy patient?

## 2019-01-05 ENCOUNTER — Telehealth (INDEPENDENT_AMBULATORY_CARE_PROVIDER_SITE_OTHER): Payer: Self-pay | Admitting: *Deleted

## 2019-01-05 NOTE — Telephone Encounter (Signed)
Prescreened pt for COVID 19 for appt scheduled 01/06/2019 and pt answered NO to all questions 

## 2019-01-06 ENCOUNTER — Other Ambulatory Visit: Payer: Self-pay

## 2019-01-06 ENCOUNTER — Ambulatory Visit (INDEPENDENT_AMBULATORY_CARE_PROVIDER_SITE_OTHER): Payer: Medicare Other

## 2019-01-06 ENCOUNTER — Ambulatory Visit (INDEPENDENT_AMBULATORY_CARE_PROVIDER_SITE_OTHER): Payer: Medicare Other | Admitting: Orthopaedic Surgery

## 2019-01-06 ENCOUNTER — Encounter (INDEPENDENT_AMBULATORY_CARE_PROVIDER_SITE_OTHER): Payer: Self-pay | Admitting: Orthopaedic Surgery

## 2019-01-06 VITALS — Ht 61.0 in | Wt 191.0 lb

## 2019-01-06 DIAGNOSIS — E0590 Thyrotoxicosis, unspecified without thyrotoxic crisis or storm: Secondary | ICD-10-CM | POA: Diagnosis not present

## 2019-01-06 DIAGNOSIS — M1611 Unilateral primary osteoarthritis, right hip: Secondary | ICD-10-CM

## 2019-01-06 NOTE — Care Plan (Signed)
RNCM met with patient at office for her 2 week initial post-op appointment. She is ambulating with a FWW and reports she is doing well. Post-op dressing removed today and MD observed dressing. Staples removed and steri-strips applied. Noted that she can put some dry gauze dressing over area if clothes are rubbing area. No refill of pain medication today as she just had one filled over the weekend. Ortho bundle 1 month surveys (Patient Satisfaction, Hoos) provided to patient with return envelope and asked to complete in approximately 2 weeks and return via mail to RNCM. Follow up appointment scheduled for 6 weeks. Reminded to contact office or RNCM with any questions or needs related to Ortho bundle. 

## 2019-01-06 NOTE — Progress Notes (Signed)
Office Visit Note   Patient: [name]           Date of Birth: 02-Sep-1933           MRN: [id number] Visit Date: 01/06/2019              Requested by: Glendale Chard, [address] [place], [place] [address] PCP: Glendale Chard, MD   Assessment & Plan: Visit Diagnoses:  1. Unilateral primary osteoarthritis, right hip     Plan: Right post total hip arthroplasty right hip.  Staples removed incision was good.  She is walking with a walker) can progress in a few weeks from the walker to a cane.  She can start driving at 6 weeks since this is a right total hip arthroplasty leg lengths are equal and she is happy with the surgical result.  She got a refill of her pain medicine last Friday.  She should be able to go to ibuprofen or Tylenol after that.  Follow-Up Instructions: Return in about 6 weeks (around 02/17/2019).   Orders:  Orders Placed This Encounter  Procedures  . XR HIP UNILAT W OR W/O PELVIS 2-3 VIEWS RIGHT   No orders of the defined types were placed in this encounter.     Procedures: No procedures performed   Clinical Data: No additional findings.   Subjective: Chief Complaint  Patient presents with  . Right Hip - Routine Post Op    12/19/2018 Right THA-direct Anterior    HPI postop right total up arthroplasty staples are still in.  Review of Systems unchanged   Objective: Vital Signs: Ht 5\' 1"  (1.549 m)   Wt 191 lb (86.6 kg)   BMI 36.09 kg/m   Physical Exam hip incision looks good staples are harvested.  Ortho Exam leg lengths are equal femoral and sciatic nerve function is intact motor and sensory.  Mild anterolateral swelling of the hip typical for postop.  Specialty Comments:  No specialty comments available.  Imaging: No results found.   PMFS History: Patient Active Problem List   Diagnosis Date Noted  . Arthritis of hip 12/20/2018  . Arthritis of right hip 12/19/2018  . Hypertensive nephropathy 12/15/2018  .  Chronic renal disease, stage II 12/15/2018  . H/O total hip arthroplasty, left 11/07/2017  . Post-traumatic osteoarthritis of left knee 11/07/2017  . Unilateral primary osteoarthritis, right knee 11/07/2017  . Unilateral primary osteoarthritis, right hip 08/06/2017  . Presence of left artificial hip joint 05/14/2017  . Preop cardiovascular exam 03/12/2017  . Cough   . Abdominal pain   . Symptomatic cholelithiasis 07/01/2016  . Nonspecific ST-T wave electrocardiographic changes   . Lower leg edema 02/16/2015  . Obesity (BMI 30.0-34.9) 02/16/2015  . Osteoporosis 01/11/2012  . Essential hypertension 01/11/2012  . Hyperthyroidism 01/11/2012  . Hypercholesteremia 01/11/2012  . H/O vaginal hysterectomy 1974 01/11/2012    Class: History of  . S/P removal of lung  partial R lobe  2010 01/11/2012  . History of hernia repair  R ing. 1975 01/11/2012  . BACK PAIN 11/12/2007  . DYSPNEA 11/11/2007   Past Medical History:  Diagnosis Date  . Arthritis    left hip and back  . Bronchiectasis    isolated to RML; status post right middle lobe partial resection.  . Complication of anesthesia    hard to wake up  . Dyspnea   . Gall stones   . GERD (gastroesophageal reflux disease)    history   . H/O  osteoporosis   . Hypertension   . Hyperthyroidism    endocrinologist - Dr. Chalmers Cater  . Pneumonia   . Yeast infection     Family History  Problem Relation Age of Onset  . Hypertension Mother   . CVA Mother 39  . Heart attack Sister   . Diabetes Sister   . Healthy Father   . Heart attack Brother   . Heart attack Brother     Past Surgical History:  Procedure Laterality Date  . ABDOMINAL HYSTERECTOMY  1974  . CHOLECYSTECTOMY N/A 07/02/2016   Procedure: LAPAROSCOPIC CHOLECYSTECTOMY;  Surgeon: Stark Klein, MD;  Location: [facility];  Service: General;  Laterality: N/A;  . COLONOSCOPY    . ENDOSCOPIC RETROGRADE CHOLANGIOPANCREATOGRAPHY (ERCP) WITH PROPOFOL N/A 08/07/2018   Procedure: ENDOSCOPIC  RETROGRADE CHOLANGIOPANCREATOGRAPHY (ERCP) WITH PROPOFOL;  Surgeon: Carol Ada, MD;  [facility];  Service: Endoscopy;  Laterality: N/A;  . EYE SURGERY     stye removed  . HERNIA REPAIR  1975  . KNEE SURGERY Left   . LUNG REMOVAL, PARTIAL  208   RML  . NM MYOVIEW LTD  06/2011   Normal EF. No ischemia or infarction.  [name]  08/07/2018   Procedure: [name];  Surgeon: Carol Ada, MD;  [facility];  Service: Endoscopy;;  balloon sweep  . TOTAL HIP ARTHROPLASTY Left 04/05/2017   Procedure: LEFT TOTAL HIP ARTHROPLASTY ANTERIOR APPROACH;  Surgeon: [name], MD;  Location: [facility];  Service: Orthopedics;  Laterality: Left;  . TOTAL HIP ARTHROPLASTY Right 12/19/2018  . TOTAL HIP ARTHROPLASTY Right 12/19/2018   Procedure: RIGHT TOTAL HIP ARTHROPLASTY-DIRECT ANTERIOR APPROACH;  Surgeon: [name], MD;  Location: [facility];  Service: Orthopedics;  Laterality: Right;  . TRANSTHORACIC ECHOCARDIOGRAM  06/2011   Mild concentric LVH. Normal EF with impaired relaxation. Mildly elevated RV pressures of 30 and 40 mmHg.  Marland Kitchen TRANSTHORACIC ECHOCARDIOGRAM  06/2016   normal LV size and function. EF 60-65% with GRD 2 DD. Mild aortic stenosis. Mild LA dilation. Mild to moderately increased PA pressures (46 mmHg).  . TUBAL LIGATION     Social History   Occupational History  . Not on file  Tobacco Use  . Smoking status: Never Smoker  . Smokeless tobacco: Never Used  Substance and Sexual Activity  . Alcohol use: No  . Drug use: No  . Sexual activity: Not Currently

## 2019-01-22 ENCOUNTER — Telehealth (INDEPENDENT_AMBULATORY_CARE_PROVIDER_SITE_OTHER): Payer: Self-pay | Admitting: *Deleted

## 2019-01-22 NOTE — Care Plan (Signed)
RNCM made 30 day phone call to patient to check status. She reports she is doing extremely well at 1 month post-op for a R- Total hip replacement. She currently has no complaints. RNCM will mail patient satisfaction and Hoos survey to her home with a stamped and addressed envelope to return to [facility]. Discussed her follow up remains scheduled for 02/17/19 with Dr. Lorin Mercy. No current questions for RNCM.

## 2019-01-22 NOTE — Telephone Encounter (Signed)
Ortho Bundle 30 day call completed. 30 day surveys mailed to patient and requested to return in provided stamped/addressed envelope.

## 2019-02-03 ENCOUNTER — Telehealth (INDEPENDENT_AMBULATORY_CARE_PROVIDER_SITE_OTHER): Payer: Self-pay | Admitting: *Deleted

## 2019-02-03 NOTE — Telephone Encounter (Signed)
6 week phone call made and receipt of 30 day surveys that were mailed to patient. Documented results of survey.

## 2019-02-17 ENCOUNTER — Ambulatory Visit (INDEPENDENT_AMBULATORY_CARE_PROVIDER_SITE_OTHER): Payer: Medicare Other | Admitting: Orthopaedic Surgery

## 2019-02-17 ENCOUNTER — Encounter: Payer: Self-pay | Admitting: Orthopaedic Surgery

## 2019-02-17 ENCOUNTER — Other Ambulatory Visit: Payer: Self-pay

## 2019-02-17 VITALS — Ht 61.0 in | Wt 185.0 lb

## 2019-02-17 DIAGNOSIS — Z96642 Presence of left artificial hip joint: Secondary | ICD-10-CM

## 2019-02-17 DIAGNOSIS — M1711 Unilateral primary osteoarthritis, right knee: Secondary | ICD-10-CM

## 2019-02-17 DIAGNOSIS — M1732 Unilateral post-traumatic osteoarthritis, left knee: Secondary | ICD-10-CM

## 2019-02-17 MED ORDER — MELOXICAM 15 MG PO TABS: 15.0000 mg | ORAL_TABLET | Freq: Every day | ORAL | 3 refills | Status: DC

## 2019-02-17 NOTE — Care Plan (Signed)
RNCM saw patient in office during her visit with MD. She reports she is doing very well, but is complaining of knee pain (right worse than left). She remains pleased with progress post hip replacement. RNCM will contact at 90 days post-op for [name]. Survey. Reminded of contact for RNCM if any needs before next call.

## 2019-02-17 NOTE — Progress Notes (Signed)
Post-Op Visit Note   Patient: [name]           Date of Birth: 08-Dec-1932           MRN: [id number] Visit Date: 02/17/2019 PCP: Glendale Chard, MD   Assessment & Plan: Post right total hip arthroplasty 12/19/2018.  Good relief of pain she is having increased problems with the right knee and is requesting a repeat injection.  She had previous tibial plateau fracture fixed with 2 pins in the left tibial plateau.  Right knee is worse than left with bone-on-bone changes varus deformity medial compartment spurring and no joint space medially.  Previously she has gotten some good relief with the knee injection.  She has had some loose Nations with ibuprofen and Naprosyn not taking any other anti-inflammatories.  Chief Complaint:  Chief Complaint  Patient presents with  . Right Hip - Follow-up    12/19/2018 Right THA- Direct Anterior  . Right Knee - Pain  . Left Knee - Pain   Visit Diagnoses:  1. Unilateral primary osteoarthritis, right knee   2. Post-traumatic osteoarthritis of left knee   3. H/O total hip arthroplasty, left     Plan: We will place patient on some Mobic that she can take daily.  Intra-articular injection performed.  I will recheck her again in 3 months.  She is happy with the results of the hip replacement.  She thinks some of the walking she has been doing may have aggravated her knee symptoms.  She has seen advertisements on television about knee braces and we discussed results from the [employer] are inconclusive for knee bracing with severe osteoarthritis.  Follow-Up Instructions: Return in about 3 months (around 05/20/2019).   Orders:  No orders of the defined types were placed in this encounter.  Meds ordered this encounter  Medications  . meloxicam (MOBIC) 15 MG tablet    Sig: Take 1 tablet (15 mg total) by mouth daily.    Dispense:  30 tablet    Refill:  3    Imaging: No results found.  PMFS History: Patient Active Problem List   Diagnosis Date Noted   . Arthritis of hip 12/20/2018  . Arthritis of right hip 12/19/2018  . Hypertensive nephropathy 12/15/2018  . Chronic renal disease, stage II 12/15/2018  . H/O total hip arthroplasty, left 11/07/2017  . Post-traumatic osteoarthritis of left knee 11/07/2017  . Unilateral primary osteoarthritis, right knee 11/07/2017  . Presence of left artificial hip joint 05/14/2017  . Preop cardiovascular exam 03/12/2017  . Cough   . Abdominal pain   . Symptomatic cholelithiasis 07/01/2016  . Nonspecific ST-T wave electrocardiographic changes   . Lower leg edema 02/16/2015  . Obesity (BMI 30.0-34.9) 02/16/2015  . Osteoporosis 01/11/2012  . Essential hypertension 01/11/2012  . Hyperthyroidism 01/11/2012  . Hypercholesteremia 01/11/2012  . H/O vaginal hysterectomy 1974 01/11/2012    Class: History of  . S/P removal of lung  partial R lobe  2010 01/11/2012  . History of hernia repair  R ing. 1975 01/11/2012  . BACK PAIN 11/12/2007  . DYSPNEA 11/11/2007   Past Medical History:  Diagnosis Date  . Arthritis    left hip and back  . Bronchiectasis    isolated to RML; status post right middle lobe partial resection.  . Complication of anesthesia    hard to wake up  . Dyspnea   . Gall stones   . GERD (gastroesophageal reflux disease)    history   . H/O  osteoporosis   . Hypertension   . Hyperthyroidism    endocrinologist - Dr. Chalmers Cater  . Pneumonia   . Yeast infection     Family History  Problem Relation Age of Onset  . Hypertension Mother   . CVA Mother 46  . Heart attack Sister   . Diabetes Sister   . Healthy Father   . Heart attack Brother   . Heart attack Brother     Past Surgical History:  Procedure Laterality Date  . ABDOMINAL HYSTERECTOMY  1974  . CHOLECYSTECTOMY N/A 07/02/2016   Procedure: LAPAROSCOPIC CHOLECYSTECTOMY;  Surgeon: Stark Klein, MD;  Location: [facility];  Service: General;  Laterality: N/A;  . COLONOSCOPY    . ENDOSCOPIC RETROGRADE CHOLANGIOPANCREATOGRAPHY (ERCP) WITH  PROPOFOL N/A 08/07/2018   Procedure: ENDOSCOPIC RETROGRADE CHOLANGIOPANCREATOGRAPHY (ERCP) WITH PROPOFOL;  Surgeon: Carol Ada, MD;  [facility];  Service: Endoscopy;  Laterality: N/A;  . EYE SURGERY     stye removed  . HERNIA REPAIR  1975  . KNEE SURGERY Left   . LUNG REMOVAL, PARTIAL  208   RML  . NM MYOVIEW LTD  06/2011   Normal EF. No ischemia or infarction.  [name]  08/07/2018   Procedure: [name];  Surgeon: Carol Ada, MD;  [facility];  Service: Endoscopy;;  balloon sweep  . TOTAL HIP ARTHROPLASTY Left 04/05/2017   Procedure: LEFT TOTAL HIP ARTHROPLASTY ANTERIOR APPROACH;  Surgeon: [name], MD;  Location: [facility];  Service: Orthopedics;  Laterality: Left;  . TOTAL HIP ARTHROPLASTY Right 12/19/2018  . TOTAL HIP ARTHROPLASTY Right 12/19/2018   Procedure: RIGHT TOTAL HIP ARTHROPLASTY-DIRECT ANTERIOR APPROACH;  Surgeon: [name], MD;  Location: [facility];  Service: Orthopedics;  Laterality: Right;  . TRANSTHORACIC ECHOCARDIOGRAM  06/2011   Mild concentric LVH. Normal EF with impaired relaxation. Mildly elevated RV pressures of 30 and 40 mmHg.  Marland Kitchen TRANSTHORACIC ECHOCARDIOGRAM  06/2016   normal LV size and function. EF 60-65% with GRD 2 DD. Mild aortic stenosis. Mild LA dilation. Mild to moderately increased PA pressures (46 mmHg).  . TUBAL LIGATION     Social History   Occupational History  . Not on file  Tobacco Use  . Smoking status: Never Smoker  . Smokeless tobacco: Never Used  Substance and Sexual Activity  . Alcohol use: No  . Drug use: No  . Sexual activity: Not Currently

## 2019-02-23 DIAGNOSIS — E0590 Thyrotoxicosis, unspecified without thyrotoxic crisis or storm: Secondary | ICD-10-CM | POA: Diagnosis not present

## 2019-02-26 DIAGNOSIS — E0590 Thyrotoxicosis, unspecified without thyrotoxic crisis or storm: Secondary | ICD-10-CM | POA: Diagnosis not present

## 2019-03-23 ENCOUNTER — Telehealth: Payer: Self-pay | Admitting: *Deleted

## 2019-03-23 NOTE — Care Plan (Signed)
RNCM contacted patient for 90 day Ortho Bundle call and survey. [name]. Completed. Patient reports she is doing well. She is able to get out of her home and do things outside like plant flowers and go to the store on her own. She verbalized that she is pleased with her status 90 days post-surgery for her Right- THA. Aware of how to contact [facility] for any further needs.

## 2019-03-23 NOTE — Telephone Encounter (Signed)
90 day Ortho Bundle call and survey completed.

## 2019-03-26 DIAGNOSIS — H01026 Squamous blepharitis left eye, unspecified eyelid: Secondary | ICD-10-CM | POA: Diagnosis not present

## 2019-03-26 DIAGNOSIS — H04123 Dry eye syndrome of bilateral lacrimal glands: Secondary | ICD-10-CM | POA: Diagnosis not present

## 2019-03-26 DIAGNOSIS — H01023 Squamous blepharitis right eye, unspecified eyelid: Secondary | ICD-10-CM | POA: Diagnosis not present

## 2019-04-01 ENCOUNTER — Ambulatory Visit (INDEPENDENT_AMBULATORY_CARE_PROVIDER_SITE_OTHER): Payer: Medicare Other | Admitting: Nurse Practitioner

## 2019-04-01 ENCOUNTER — Other Ambulatory Visit: Payer: Self-pay

## 2019-04-01 ENCOUNTER — Encounter: Payer: Self-pay | Admitting: Nurse Practitioner

## 2019-04-01 VITALS — BP 110/70 | HR 58 | Temp 97.8°F | Ht 60.6 in | Wt 191.8 lb

## 2019-04-01 DIAGNOSIS — L237 Allergic contact dermatitis due to plants, except food: Secondary | ICD-10-CM | POA: Diagnosis not present

## 2019-04-01 DIAGNOSIS — R21 Rash and other nonspecific skin eruption: Secondary | ICD-10-CM

## 2019-04-01 MED ORDER — PREDNISONE 10 MG (21) PO TBPK: ORAL_TABLET | ORAL | 0 refills | Status: DC

## 2019-04-01 MED ORDER — TRIAMCINOLONE ACETONIDE 40 MG/ML IJ SUSP
40.0000 mg | Freq: Once | INTRAMUSCULAR | Status: AC
  Administered 2019-04-01: 40 mg via INTRAMUSCULAR

## 2019-04-01 NOTE — Progress Notes (Signed)
Subjective:     Patient ID: Burgess Amor , female    DOB: 23-Jul-1933 , 85 y.o.   MRN: [id number]   Chief Complaint  Patient presents with  . Facial Swelling    patient states she thinks she has posion ivy    HPI  Facial swelling - went to eye doctor and given eye drops then on yesterday morning she had swelling.      Past Medical History:  Diagnosis Date  . Arthritis    left hip and back  . Bronchiectasis    isolated to RML; status post right middle lobe partial resection.  . Complication of anesthesia    hard to wake up  . Dyspnea   . Gall stones   . GERD (gastroesophageal reflux disease)    history   . H/O osteoporosis   . Hypertension   . Hyperthyroidism    endocrinologist - Dr. Chalmers Cater  . Pneumonia   . Yeast infection      Family History  Problem Relation Age of Onset  . Hypertension Mother   . CVA Mother 72  . Heart attack Sister   . Diabetes Sister   . Healthy Father   . Heart attack Brother   . Heart attack Brother      Current Outpatient Medications:  .  acetaminophen (TYLENOL 8 HOUR) 650 MG CR tablet, Take 650 mg by mouth every 8 (eight) hours as needed for pain., Disp: , Rfl:  .  aspirin (BAYER ASPIRIN) 325 MG tablet, Take 1 tablet (325 mg total) by mouth daily., Disp: , Rfl:  .  furosemide (LASIX) 20 MG tablet, Take 20 mg by mouth daily.  , Disp: , Rfl:  .  methimazole (TAPAZOLE) 10 MG tablet, Take 10 mg by mouth 2 (two) times daily. , Disp: , Rfl:  .  metoprolol succinate (TOPROL-XL) 25 MG 24 hr tablet, Take 25 mg by mouth at bedtime. , Disp: , Rfl:  .  potassium chloride SA (K-DUR,KLOR-CON) 20 MEQ tablet, Take 20 mEq by mouth daily. , Disp: , Rfl:    Allergies  Allergen Reactions  . Ibuprofen Other (See Comments)    hallucinations  . Naproxen Sodium Other (See Comments)    hallucinations     Review of Systems   Today's Vitals   04/01/19 1151  BP: 110/70  Pulse: (!) 58  Temp: 97.8 F (36.6 C)  TempSrc: Oral  Weight: 191 lb 12.8 oz  (87 kg)  Height: 5' 0.6" (1.539 m)  PainSc: 0-No pain   Body mass index is 36.72 kg/m.   Objective:  Physical Exam Constitutional:      Appearance: Normal appearance.  Cardiovascular:     Pulses: Normal pulses.     Heart sounds: Normal heart sounds.  Pulmonary:     Effort: Pulmonary effort is normal.     Breath sounds: Normal breath sounds.  Skin:    General: Skin is warm and dry.     Capillary Refill: Capillary refill takes less than 2 seconds.     Findings: Erythema (left side of cheek is erythematous and noticed on forehead too) present.  Neurological:     Mental Status: She is alert.         Assessment And Plan:     1. Rash and nonspecific skin eruption  Erythema and swelling to left face mostly - triamcinolone acetonide (KENALOG-40) injection 40 mg - predniSONE (STERAPRED UNI-PAK 21 TAB) 10 MG (21) TBPK tablet; Take as directed  Dispense: 21 tablet;  Refill: 0  2. Poison ivy dermatitis  Advised to avoid touching her face or eyes as this is likely to continue spreading   Minette Brine, FNP    THE PATIENT IS ENCOURAGED TO PRACTICE SOCIAL DISTANCING DUE TO THE COVID-19 PANDEMIC.

## 2019-04-09 DIAGNOSIS — Z1239 Encounter for other screening for malignant neoplasm of breast: Secondary | ICD-10-CM | POA: Diagnosis not present

## 2019-04-09 DIAGNOSIS — Z01419 Encounter for gynecological examination (general) (routine) without abnormal findings: Secondary | ICD-10-CM | POA: Diagnosis not present

## 2019-04-09 DIAGNOSIS — Z6839 Body mass index (BMI) 39.0-39.9, adult: Secondary | ICD-10-CM | POA: Diagnosis not present

## 2019-04-09 DIAGNOSIS — Z1211 Encounter for screening for malignant neoplasm of colon: Secondary | ICD-10-CM | POA: Diagnosis not present

## 2019-05-15 ENCOUNTER — Other Ambulatory Visit: Payer: Self-pay | Admitting: Internal Medicine

## 2019-05-20 ENCOUNTER — Ambulatory Visit (INDEPENDENT_AMBULATORY_CARE_PROVIDER_SITE_OTHER): Payer: Medicare Other | Admitting: Orthopaedic Surgery

## 2019-05-20 ENCOUNTER — Encounter: Payer: Self-pay | Admitting: Orthopaedic Surgery

## 2019-05-20 ENCOUNTER — Other Ambulatory Visit: Payer: Self-pay

## 2019-05-20 VITALS — BP 127/82 | HR 56 | Ht 65.0 in | Wt 180.0 lb

## 2019-05-20 DIAGNOSIS — M1711 Unilateral primary osteoarthritis, right knee: Secondary | ICD-10-CM | POA: Diagnosis not present

## 2019-05-20 DIAGNOSIS — M1732 Unilateral post-traumatic osteoarthritis, left knee: Secondary | ICD-10-CM | POA: Diagnosis not present

## 2019-05-20 NOTE — Progress Notes (Signed)
Office Visit Note   Patient: [name]           Date of Birth: 02-27-1933           MRN: [id number] Visit Date: 05/20/2019              Requested by: Glendale Chard, [address] [place],  [place] [address] PCP: Glendale Chard, MD   Assessment & Plan: Visit Diagnoses:  1. Post-traumatic osteoarthritis of left knee   2. Unilateral primary osteoarthritis, right knee     Plan: Patient will continue to ambulate with a cane to prevent falling.  We discussed total knee arthroplasty currently right worse than left she will call when she is ready to proceed or else can return for recheck in 3 months.  She is happy with the results of her right and left hip arthroplasties.  Follow-Up Instructions: Return in about 3 months (around 08/20/2019).   Orders:  No orders of the defined types were placed in this encounter.  No orders of the defined types were placed in this encounter.     Procedures: No procedures performed   Clinical Data: No additional findings.   Subjective: Chief Complaint  Patient presents with  . Right Hip - Follow-up    12/19/2018 Right THA Direct Anterior  . Left Knee - Follow-up, Pain  . Right Knee - Follow-up, Pain    HPI 85 year old female returns with bilateral knee pain which had injections in May with some improvement.  Both knees bother her.  Left had a tibial plateau fracture fixed with 2 screws years ago.  Right knee has bone-on-bone changes with progressive varus deformity.  She had total hip arthroplasties left June 2018 and right March 2020 both doing well.  She ambulates with a cane since her knees are painful and sometimes give way.  She uses Tylenol and also uses some Voltaren gel for her knees which helps.  She remains a Hydrographic surveyor.  Review of Systems reviewed updated unchanged from her previous hip surgery.  Previous partial right lung removal 2010.  Positive hypertension stage II renal disease.  Otherwise negative  is a pertains HPI.   Objective: Vital Signs: BP 127/82   Pulse (!) 56   Ht 5\' 5"  (1.651 m)   Wt 180 lb (81.6 kg)   BMI 29.95 kg/m   Physical Exam Constitutional:      Appearance: She is well-developed.  HENT:     Head: Normocephalic.     Right Ear: External ear normal.     Left Ear: External ear normal.  Eyes:     Pupils: Pupils are equal, round, and reactive to light.  Neck:     Thyroid: No thyromegaly.     Trachea: No tracheal deviation.  Cardiovascular:     Rate and Rhythm: Normal rate.  Pulmonary:     Effort: Pulmonary effort is normal.  Abdominal:     Palpations: Abdomen is soft.  Skin:    General: Skin is warm and dry.  Neurological:     Mental Status: She is alert and oriented to person, place, and time.  Psychiatric:        Behavior: Behavior normal.     Ortho Exam patient has bilateral knee crepitus no pain with hip range of motion.  She ambulates with right knee in flexed position does not reach full extension but can reach full extension passively in sitting position.  Both knees flex past 90 degrees.  Specialty Comments:  No specialty comments available.  Imaging: No results found.   PMFS History: Patient Active Problem List   Diagnosis Date Noted  . Arthritis of hip 12/20/2018  . Arthritis of right hip 12/19/2018  . Hypertensive nephropathy 12/15/2018  . Chronic renal disease, stage II 12/15/2018  . H/O total hip arthroplasty, left 11/07/2017  . Post-traumatic osteoarthritis of left knee 11/07/2017  . Unilateral primary osteoarthritis, right knee 11/07/2017  . Presence of left artificial hip joint 05/14/2017  . Preop cardiovascular exam 03/12/2017  . Cough   . Abdominal pain   . Symptomatic cholelithiasis 07/01/2016  . Nonspecific ST-T wave electrocardiographic changes   . Lower leg edema 02/16/2015  . Obesity (BMI 30.0-34.9) 02/16/2015  . Osteoporosis 01/11/2012  . Essential hypertension 01/11/2012  . Hyperthyroidism 01/11/2012  .  Hypercholesteremia 01/11/2012  . H/O vaginal hysterectomy 1974 01/11/2012    Class: History of  . S/P removal of lung  partial R lobe  2010 01/11/2012  . History of hernia repair  R ing. 1975 01/11/2012  . BACK PAIN 11/12/2007  . DYSPNEA 11/11/2007   Past Medical History:  Diagnosis Date  . Arthritis    left hip and back  . Bronchiectasis    isolated to RML; status post right middle lobe partial resection.  . Complication of anesthesia    hard to wake up  . Dyspnea   . Gall stones   . GERD (gastroesophageal reflux disease)    history   . H/O osteoporosis   . Hypertension   . Hyperthyroidism    endocrinologist - Dr. Chalmers Cater  . Pneumonia   . Yeast infection     Family History  Problem Relation Age of Onset  . Hypertension Mother   . CVA Mother 30  . Heart attack Sister   . Diabetes Sister   . Healthy Father   . Heart attack Brother   . Heart attack Brother     Past Surgical History:  Procedure Laterality Date  . ABDOMINAL HYSTERECTOMY  1974  . CHOLECYSTECTOMY N/A 07/02/2016   Procedure: LAPAROSCOPIC CHOLECYSTECTOMY;  Surgeon: Stark Klein, MD;  Location: [facility];  Service: General;  Laterality: N/A;  . COLONOSCOPY    . ENDOSCOPIC RETROGRADE CHOLANGIOPANCREATOGRAPHY (ERCP) WITH PROPOFOL N/A 08/07/2018   Procedure: ENDOSCOPIC RETROGRADE CHOLANGIOPANCREATOGRAPHY (ERCP) WITH PROPOFOL;  Surgeon: Carol Ada, MD;  [facility];  Service: Endoscopy;  Laterality: N/A;  . EYE SURGERY     stye removed  . HERNIA REPAIR  1975  . KNEE SURGERY Left   . LUNG REMOVAL, PARTIAL  208   RML  . NM MYOVIEW LTD  06/2011   Normal EF. No ischemia or infarction.  [name]  08/07/2018   Procedure: [name];  Surgeon: Carol Ada, MD;  [facility];  Service: Endoscopy;;  balloon sweep  . TOTAL HIP ARTHROPLASTY Left 04/05/2017   Procedure: LEFT TOTAL HIP ARTHROPLASTY ANTERIOR APPROACH;  Surgeon: [name], MD;  Location: [place];  Service: Orthopedics;   Laterality: Left;  . TOTAL HIP ARTHROPLASTY Right 12/19/2018  . TOTAL HIP ARTHROPLASTY Right 12/19/2018   Procedure: RIGHT TOTAL HIP ARTHROPLASTY-DIRECT ANTERIOR APPROACH;  Surgeon: [name], MD;  Location: [facility];  Service: Orthopedics;  Laterality: Right;  . TRANSTHORACIC ECHOCARDIOGRAM  06/2011   Mild concentric LVH. Normal EF with impaired relaxation. Mildly elevated RV pressures of 30 and 40 mmHg.  Marland Kitchen TRANSTHORACIC ECHOCARDIOGRAM  06/2016   normal LV size and function. EF 60-65% with GRD 2 DD. Mild aortic stenosis. Mild LA  dilation. Mild to moderately increased PA pressures (46 mmHg).  . TUBAL LIGATION     Social History   Occupational History  . Not on file  Tobacco Use  . Smoking status: Never Smoker  . Smokeless tobacco: Never Used  Substance and Sexual Activity  . Alcohol use: No  . Drug use: No  . Sexual activity: Not Currently

## 2019-05-28 DIAGNOSIS — E0590 Thyrotoxicosis, unspecified without thyrotoxic crisis or storm: Secondary | ICD-10-CM | POA: Diagnosis not present

## 2019-06-24 ENCOUNTER — Telehealth: Payer: Self-pay | Admitting: Internal Medicine

## 2019-06-24 NOTE — Chronic Care Management (AMB) (Signed)
°  Chronic Care Management   Outreach Note  06/24/2019 Name: [name] MRN: [id number] DOB: 1933/05/08  Referred by: Glendale Chard, MD Reason for referral : Chronic Care Management (Third CCM outreach was unsuccessful.)   Third unsuccessful telephone outreach was attempted today. The patient was referred to the case management team for assistance with chronic care management and care coordination. The patient's primary care provider has been notified of our unsuccessful attempts to make or maintain contact with the patient. The care management team is pleased to engage with this patient at any time in the future should he/she be interested in assistance from the care management team.   Follow Up Plan: The care management team is available to follow up with the patient after provider conversation with the patient regarding recommendation for care management engagement and subsequent re-referral to the care management team.   [place]  [email or web address]   ??[phone]

## 2019-07-08 ENCOUNTER — Ambulatory Visit (INDEPENDENT_AMBULATORY_CARE_PROVIDER_SITE_OTHER): Payer: Medicare Other

## 2019-07-08 ENCOUNTER — Ambulatory Visit (INDEPENDENT_AMBULATORY_CARE_PROVIDER_SITE_OTHER): Payer: Medicare Other | Admitting: Internal Medicine

## 2019-07-08 ENCOUNTER — Encounter: Payer: Self-pay | Admitting: Internal Medicine

## 2019-07-08 ENCOUNTER — Other Ambulatory Visit: Payer: Self-pay

## 2019-07-08 VITALS — BP 128/72 | HR 66 | Temp 98.3°F | Ht 61.4 in | Wt 197.2 lb

## 2019-07-08 VITALS — BP 128/73 | HR 66 | Temp 98.3°F | Ht 61.4 in | Wt 197.2 lb

## 2019-07-08 DIAGNOSIS — N182 Chronic kidney disease, stage 2 (mild): Secondary | ICD-10-CM | POA: Diagnosis not present

## 2019-07-08 DIAGNOSIS — Z6836 Body mass index (BMI) 36.0-36.9, adult: Secondary | ICD-10-CM | POA: Diagnosis not present

## 2019-07-08 DIAGNOSIS — E0590 Thyrotoxicosis, unspecified without thyrotoxic crisis or storm: Secondary | ICD-10-CM | POA: Diagnosis not present

## 2019-07-08 DIAGNOSIS — G8929 Other chronic pain: Secondary | ICD-10-CM | POA: Diagnosis not present

## 2019-07-08 DIAGNOSIS — I129 Hypertensive chronic kidney disease with stage 1 through stage 4 chronic kidney disease, or unspecified chronic kidney disease: Secondary | ICD-10-CM

## 2019-07-08 DIAGNOSIS — Z79899 Other long term (current) drug therapy: Secondary | ICD-10-CM

## 2019-07-08 DIAGNOSIS — M79674 Pain in right toe(s): Secondary | ICD-10-CM | POA: Diagnosis not present

## 2019-07-08 DIAGNOSIS — E7800 Pure hypercholesterolemia, unspecified: Secondary | ICD-10-CM | POA: Diagnosis not present

## 2019-07-08 DIAGNOSIS — I739 Peripheral vascular disease, unspecified: Secondary | ICD-10-CM

## 2019-07-08 DIAGNOSIS — E2839 Other primary ovarian failure: Secondary | ICD-10-CM

## 2019-07-08 DIAGNOSIS — Z0000 Encounter for general adult medical examination without abnormal findings: Secondary | ICD-10-CM

## 2019-07-08 NOTE — Patient Instructions (Signed)

## 2019-07-08 NOTE — Progress Notes (Signed)
Subjective:   [name] is a 85 y.o. female who presents for Medicare Annual (Subsequent) preventive examination.  Review of Systems:  n/a Cardiac Risk Factors include: advanced age (>106men, >23 women);hypertension;obesity (BMI >30kg/m2);sedentary lifestyle     Objective:     Vitals: BP 128/73   Pulse 66   Temp 98.3 F (36.8 C) (Oral)   Ht 5' 1.4" (1.56 m)   Wt 197 lb 3.2 oz (89.4 kg)   SpO2 96%   BMI 36.78 kg/m   Body mass index is 36.78 kg/m.  Advanced Directives 07/08/2019 12/19/2018 12/08/2018 09/09/2018 08/07/2018 08/05/2018 07/15/2018  Does Patient Have a Medical Advance Directive? No Yes No Yes Yes Yes Yes  Type of Advance Directive - [place];Living will - Living will [place];Living will Living will;Healthcare Power of [place];Living will  Does patient want to make changes to medical advance directive? - No - Patient declined - No - Patient declined - - No - Patient declined  Copy of Healthcare Power of Attorney in Chart? - No - copy requested - - No - copy requested No - copy requested No - copy requested  Would patient like information on creating a medical advance directive? - No - Patient declined No - Patient declined - - - -    Tobacco Social History   Tobacco Use  Smoking Status Never Smoker  Smokeless Tobacco Never Used     Counseling given: Not Answered   Clinical Intake:  Pre-visit preparation completed: Yes  Pain : No/denies pain     Nutritional Status: BMI > 30  Obese Nutritional Risks: None Diabetes: No  How often do you need to have someone help you when you read instructions, pamphlets, or other written materials from your doctor or pharmacy?: 1 - Never What is the last grade level you completed in school?: some college  Interpreter Needed?: No  Information entered by :: NAllen LPN  Past Medical History:  Diagnosis Date  . Arthritis    left hip and back  .  Bronchiectasis    isolated to RML; status post right middle lobe partial resection.  . Complication of anesthesia    hard to wake up  . Dyspnea   . Gall stones   . GERD (gastroesophageal reflux disease)    history   . H/O osteoporosis   . Hypertension   . Hyperthyroidism    endocrinologist - Dr. Chalmers Cater  . Pneumonia   . Yeast infection    Past Surgical History:  Procedure Laterality Date  . ABDOMINAL HYSTERECTOMY  1974  . CHOLECYSTECTOMY N/A 07/02/2016   Procedure: LAPAROSCOPIC CHOLECYSTECTOMY;  Surgeon: Stark Klein, MD;  Location: [place];  Service: General;  Laterality: N/A;  . COLONOSCOPY    . ENDOSCOPIC RETROGRADE CHOLANGIOPANCREATOGRAPHY (ERCP) WITH PROPOFOL N/A 08/07/2018   Procedure: ENDOSCOPIC RETROGRADE CHOLANGIOPANCREATOGRAPHY (ERCP) WITH PROPOFOL;  Surgeon: Carol Ada, MD;  Location: [facility];  Service: Endoscopy;  Laterality: N/A;  . EYE SURGERY     stye removed  . HERNIA REPAIR  1975  . KNEE SURGERY Left   . LUNG REMOVAL, PARTIAL  208   RML  . NM MYOVIEW LTD  06/2011   Normal EF. No ischemia or infarction.  [name]  08/07/2018   Procedure: [name];  Surgeon: Carol Ada, MD;  Location: [facility];  Service: Endoscopy;;  balloon sweep  . TOTAL HIP ARTHROPLASTY Left 04/05/2017   Procedure: LEFT TOTAL HIP ARTHROPLASTY ANTERIOR APPROACH;  Surgeon: Lorin Mercy,  Thana Farr, MD;  Location: [place];  Service: Orthopedics;  Laterality: Left;  . TOTAL HIP ARTHROPLASTY Right 12/19/2018  . TOTAL HIP ARTHROPLASTY Right 12/19/2018   Procedure: RIGHT TOTAL HIP ARTHROPLASTY-DIRECT ANTERIOR APPROACH;  Surgeon: [name], MD;  Location: [place];  Service: Orthopedics;  Laterality: Right;  . TRANSTHORACIC ECHOCARDIOGRAM  06/2011   Mild concentric LVH. Normal EF with impaired relaxation. Mildly elevated RV pressures of 30 and 40 mmHg.  Marland Kitchen TRANSTHORACIC ECHOCARDIOGRAM  06/2016   normal LV size and function. EF 60-65% with GRD 2 DD. Mild aortic stenosis. Mild LA dilation.  Mild to moderately increased PA pressures (46 mmHg).  . TUBAL LIGATION     Family History  Problem Relation Age of Onset  . Hypertension Mother   . CVA Mother 57  . Heart attack Sister   . Diabetes Sister   . Healthy Father   . Heart attack Brother   . Heart attack Brother    Social History   Socioeconomic History  . Marital status: Widowed    Spouse name: Not on file  . Number of children: Not on file  . Years of education: Not on file  . Highest education level: Not on file  Occupational History  . Occupation: retired  Scientific laboratory technician  . Financial resource strain: Not hard at all  . Food insecurity    Worry: Never true    Inability: Never true  . Transportation needs    Medical: No    Non-medical: No  Tobacco Use  . Smoking status: Never Smoker  . Smokeless tobacco: Never Used  Substance and Sexual Activity  . Alcohol use: No  . Drug use: No  . Sexual activity: Not Currently  Lifestyle  . Physical activity    Days per week: 0 days    Minutes per session: 0 min  . Stress: Not at all  Relationships  . Social Herbalist on phone: Not on file    Gets together: Not on file    Attends religious service: Not on file    Active member of club or organization: Not on file    Attends meetings of clubs or organizations: Not on file    Relationship status: Not on file  Other Topics Concern  . Not on file  Social History Narrative   She is a widowed mother of 74, grandmother for, a great-grandmother 34.   She has been trying to do more exercise than before, but has been injured recently by her back pain.   She never smoked, doesn't drink alcohol.    Outpatient Encounter Medications as of 07/08/2019  Medication Sig  . acetaminophen (TYLENOL 8 HOUR) 650 MG CR tablet Take 650 mg by mouth every 8 (eight) hours as needed for pain.  Marland Kitchen aspirin (BAYER ASPIRIN) 325 MG tablet Take 1 tablet (325 mg total) by mouth daily. (Patient taking differently: Take 81 mg by mouth  daily. )  . diclofenac sodium (VOLTAREN) 1 % GEL   . furosemide (LASIX) 20 MG tablet Take 20 mg by mouth daily.    . methimazole (TAPAZOLE) 10 MG tablet Take 10 mg by mouth 2 (two) times daily.   . metoprolol succinate (TOPROL-XL) 25 MG 24 hr tablet Take 25 mg by mouth at bedtime.   Marland Kitchen nystatin cream (MYCOSTATIN) APPLY CREAM TO AFFECTED AREA TWICE A DAY AS NEEDED  . potassium chloride SA (K-DUR,KLOR-CON) 20 MEQ tablet Take 20 mEq by mouth daily.   . predniSONE (STERAPRED  UNI-PAK 21 TAB) 10 MG (21) TBPK tablet Take as directed (Patient not taking: Reported on 05/20/2019)   No facility-administered encounter medications on file as of 07/08/2019.     Activities of Daily Living In your present state of health, do you have any difficulty performing the following activities: 07/08/2019 12/19/2018  Hearing? N -  Vision? N -  Difficulty concentrating or making decisions? N -  Walking or climbing stairs? N -  Dressing or bathing? N -  Doing errands, shopping? N N  Preparing Food and eating ? N -  Using the Toilet? N -  In the past six months, have you accidently leaked urine? Y -  Do you have problems with loss of bowel control? N -  Managing your Medications? N -  Managing your Finances? N -  Housekeeping or managing your Housekeeping? N -  Some recent data might be hidden    Patient Care Team: Glendale Chard, MD as PCP - General (Internal Medicine) Leonie Man, MD as PCP - Cardiology (Cardiology) [name], MD as Consulting Physician (Orthopedic Surgery)    Assessment:   This is a routine wellness examination for [name].  Exercise Activities and Dietary recommendations Current Exercise Habits: The patient does not participate in regular exercise at present  Goals    . Weight (lb) < 200 lb (90.7 kg)     07/08/2019, wants to weigh 180       Fall Risk Fall Risk  07/08/2019 07/08/2019 04/01/2019 12/15/2018 11/21/2018  Falls in the past year? 0 0 0 0 0  Comment - - - - -  Risk  for fall due to : Impaired balance/gait;Impaired mobility - - - -  Follow up Falls evaluation completed;Education provided;Falls prevention discussed - - - -   Is the patient's home free of loose throw rugs in walkways, pet beds, electrical cords, etc?   yes      Grab bars in the bathroom? yes      Handrails on the stairs?   yes      Adequate lighting?   yes  Timed Get Up and Go performed: n/a  Depression Screen PHQ 2/9 Scores 07/08/2019 07/08/2019 04/01/2019 12/15/2018  PHQ - 2 Score 0 0 0 0     Cognitive Function     6CIT Screen 07/08/2019  What Year? 0 points  What month? 0 points  What time? 0 points  Count back from 20 0 points  Months in reverse 0 points  Repeat phrase 0 points  Total Score 0     There is no immunization history on file for this patient.  Qualifies for Shingles Vaccine? yes  Screening Tests Health Maintenance  Topic Date Due  . TETANUS/TDAP  05/24/1954  . PNA vac Low Risk Adult (1 of 2 - PCV13) 05/24/2000  . INFLUENZA VACCINE  01/06/2020 (Originally 05/09/2019)  . DEXA SCAN  Completed    Cancer Screenings: Lung: Low Dose CT Chest recommended if Age 21-80 years, 30 pack-year currently smoking OR have quit w/in 15years. Patient does not qualify. Breast:  Up to date on Mammogram? Yes   Up to date of Bone Density/Dexa? Yes Colorectal: not required  Additional Screenings: : Hepatitis C Screening: n/a     Plan:    Patient wants to weigh 180 pounds.   I have personally reviewed and noted the following in the patient's chart:   . Medical and social history . Use of alcohol, tobacco or illicit drugs  . Current medications and  supplements . Functional ability and status . Nutritional status . Physical activity . Advanced directives . List of other physicians . Hospitalizations, surgeries, and ER visits in previous 12 months . Vitals . Screenings to include cognitive, depression, and falls . Referrals and appointments  In addition, I have  reviewed and discussed with patient certain preventive protocols, quality metrics, and best practice recommendations. A written personalized care plan for preventive services as well as general preventive health recommendations were provided to patient.     Kellie Simmering, LPN  [id number]

## 2019-07-08 NOTE — Progress Notes (Signed)
Subjective:     Patient ID: [name] , female    DOB: Mar 08, 1933 , 85 y.o.   MRN: [id number]   Chief Complaint  Patient presents with  . Hypertension    HPI  Hypertension This is a chronic problem. The current episode started more than 1 year ago. The problem has been gradually improving since onset. The problem is controlled. Pertinent negatives include no blurred vision, chest pain, palpitations or shortness of breath. Risk factors for coronary artery disease include dyslipidemia, obesity and post-menopausal state. Past treatments include beta blockers. The current treatment provides moderate improvement. Compliance problems include exercise.      Past Medical History:  Diagnosis Date  . Arthritis    left hip and back  . Bronchiectasis    isolated to RML; status post right middle lobe partial resection.  . Complication of anesthesia    hard to wake up  . Dyspnea   . Gall stones   . GERD (gastroesophageal reflux disease)    history   . H/O osteoporosis   . Hypertension   . Hyperthyroidism    endocrinologist - Dr. Chalmers Cater  . Pneumonia   . Yeast infection      Family History  Problem Relation Age of Onset  . Hypertension Mother   . CVA Mother 69  . Heart attack Sister   . Diabetes Sister   . Healthy Father   . Heart attack Brother   . Heart attack Brother      Current Outpatient Medications:  .  acetaminophen (TYLENOL 8 HOUR) 650 MG CR tablet, Take 650 mg by mouth every 8 (eight) hours as needed for pain., Disp: , Rfl:  .  aspirin (BAYER ASPIRIN) 325 MG tablet, Take 1 tablet (325 mg total) by mouth daily. (Patient taking differently: Take 81 mg by mouth daily. ), Disp: , Rfl:  .  diclofenac sodium (VOLTAREN) 1 % GEL, , Disp: , Rfl:  .  furosemide (LASIX) 20 MG tablet, Take 20 mg by mouth daily.  , Disp: , Rfl:  .  methimazole (TAPAZOLE) 10 MG tablet, Take 10 mg by mouth 2 (two) times daily. , Disp: , Rfl:  .  metoprolol succinate (TOPROL-XL) 25 MG 24 hr  tablet, Take 25 mg by mouth at bedtime. , Disp: , Rfl:  .  nystatin cream (MYCOSTATIN), APPLY CREAM TO AFFECTED AREA TWICE A DAY AS NEEDED, Disp: 30 g, Rfl: 23 .  potassium chloride SA (K-DUR,KLOR-CON) 20 MEQ tablet, Take 20 mEq by mouth daily. , Disp: , Rfl:  .  predniSONE (STERAPRED UNI-PAK 21 TAB) 10 MG (21) TBPK tablet, Take as directed (Patient not taking: Reported on 05/20/2019), Disp: 21 tablet, Rfl: 0   Allergies  Allergen Reactions  . Ibuprofen Other (See Comments)    hallucinations  . Naproxen Sodium Other (See Comments)    hallucinations     Review of Systems  Constitutional: Negative.   Eyes: Negative for blurred vision.  Respiratory: Negative.  Negative for shortness of breath.   Cardiovascular: Negative.  Negative for chest pain and palpitations.  Gastrointestinal: Negative.   Neurological: Positive for numbness.       She c/o right toe pain. She has difficulty describing the pain. States it is not in her joints. Denies dull, throbbing pain. More of a sharp pain, then she states not really a pain, more of a discomfort. Denies issues with balance. Denies numbness, but could be a tingling sensation. She reports, "they just feel different".   Psychiatric/Behavioral:  Negative.      Today's Vitals   07/08/19 1032  BP: 128/72  Pulse: 66  Temp: 98.3 F (36.8 C)  TempSrc: Oral  SpO2: 96%  Weight: 197 lb 3.2 oz (89.4 kg)  Height: 5' 1.4" (1.56 m)   Body mass index is 36.78 kg/m.   Objective:  Physical Exam Vitals signs and nursing note reviewed.  Constitutional:      Appearance: Normal appearance.  HENT:     Head: Normocephalic and atraumatic.  Cardiovascular:     Rate and Rhythm: Normal rate and regular rhythm.     Pulses:          Dorsalis pedis pulses are 2+ on the right side.     Heart sounds: Normal heart sounds.  Pulmonary:     Effort: Pulmonary effort is normal.     Breath sounds: Normal breath sounds.  Musculoskeletal:     Right foot: Normal range of  motion. No deformity or bunion.  Feet:     Right foot:     Skin integrity: No erythema or warmth.  Skin:    General: Skin is warm.  Neurological:     General: No focal deficit present.     Mental Status: She is alert.  Psychiatric:        Mood and Affect: Mood normal.        Behavior: Behavior normal.         Assessment And Plan:     1. Hypertensive nephropathy  Chronic, well controlled. She will continue with current meds. She is encouraged to avoid adding salt to her foods. She will rto in six months for re-evaluation.   - CMP14+EGFR - CBC  2. Chronic renal disease, stage II  Chronic. I will check a GFR, Cr today. She is encouraged to stay well hydrated.   3. Pure hypercholesterolemia  Chronic, I will check fasting lipid panel. She has not been taking meds since March 2020. I will make further recommendations once her labs are available for review.   - Lipid panel  4. Intermittent claudication (HCC)  Chronic, she reports her sx are stable at this time. Unfortunately, she has been unable to tolerate statins in the past. She is encouraged to stay active.   5. Estrogen deficiency  I will refer her to [facility] for bone density. Pt would like to have mammogram performed the same day.   - DG Bone Density; Future  6. Hyperthyroidism  This has been managed by Dr. Chalmers Cater. I will check labs as listed below and forward her labs to Dr. Chalmers Cater for her to review. She is currently taking 2.66m methimazole daily. She is hoping to get off of this medication in the near future.   - TSH - T4, Free - T3, free  7. Class 2 severe obesity due to excess calories with serious comorbidity and body mass index (BMI) of 36.0 to 36.9 in adult ([facility]  She is encouraged to strive for BMI less than 30 to decrease cardiac risk. She is encouraged to increase activity as tolerated and to avoid sugary beverages and processed foods.   8. Drug therapy  - Vitamin B12  9. Toe pain, chronic,  right  I am not sure what type of pain she is experiencing. Perhaps it is dysesthesias, I will check vitamin B12 level.    RMaximino Greenland MD    THE PATIENT IS ENCOURAGED TO PRACTICE SOCIAL DISTANCING DUE TO THE COVID-19 PANDEMIC.

## 2019-07-08 NOTE — Patient Instructions (Signed)
[name] , Thank you for taking time to come for your Medicare Wellness Visit. I appreciate your ongoing commitment to your health goals. Please review the following plan we discussed and let me know if I can assist you in the future.   Screening recommendations/referrals: Colonoscopy: not required Mammogram: 08/2017 Bone Density: 08/2017 Recommended yearly ophthalmology/optometry visit for glaucoma screening and checkup Recommended yearly dental visit for hygiene and checkup  Vaccinations: Influenza vaccine: decline Pneumococcal vaccine: decline Tdap vaccine: decline Shingles vaccine: discussed    Advanced directives: Advance directive discussed with you today. Even though you declined this today please call our office should you change your mind and we can give you the proper paperwork for you to fill out.   Conditions/risks identified: obesity  Next appointment: 01/05/2020 at 11:15   Preventive Care 83 Years and Older, Female Preventive care refers to lifestyle choices and visits with your health care provider that can promote health and wellness. What does preventive care include?  A yearly physical exam. This is also called an annual well check.  Dental exams once or twice a year.  Routine eye exams. Ask your health care provider how often you should have your eyes checked.  Personal lifestyle choices, including:  Daily care of your teeth and gums.  Regular physical activity.  Eating a healthy diet.  Avoiding tobacco and drug use.  Limiting alcohol use.  Practicing safe sex.  Taking low-dose aspirin every day.  Taking vitamin and mineral supplements as recommended by your health care provider. What happens during an annual well check? The services and screenings done by your health care provider during your annual well check will depend on your age, overall health, lifestyle risk factors, and family history of disease. Counseling  Your health care provider  may ask you questions about your:  Alcohol use.  Tobacco use.  Drug use.  Emotional well-being.  Home and relationship well-being.  Sexual activity.  Eating habits.  History of falls.  Memory and ability to understand (cognition).  Work and work Statistician.  Reproductive health. Screening  You may have the following tests or measurements:  Height, weight, and BMI.  Blood pressure.  Lipid and cholesterol levels. These may be checked every 5 years, or more frequently if you are over 39 years old.  Skin check.  Lung cancer screening. You may have this screening every year starting at age 23 if you have a 30-pack-year history of smoking and currently smoke or have quit within the past 15 years.  Fecal occult blood test (FOBT) of the stool. You may have this test every year starting at age 45.  Flexible sigmoidoscopy or colonoscopy. You may have a sigmoidoscopy every 5 years or a colonoscopy every 10 years starting at age 49.  Hepatitis C blood test.  Hepatitis B blood test.  Sexually transmitted disease (STD) testing.  Diabetes screening. This is done by checking your blood sugar (glucose) after you have not eaten for a while (fasting). You may have this done every 1-3 years.  Bone density scan. This is done to screen for osteoporosis. You may have this done starting at age 63.  Mammogram. This may be done every 1-2 years. Talk to your health care provider about how often you should have regular mammograms. Talk with your health care provider about your test results, treatment options, and if necessary, the need for more tests. Vaccines  Your health care provider may recommend certain vaccines, such as:  Influenza vaccine. This is recommended  every year.  Tetanus, diphtheria, and acellular pertussis (Tdap, Td) vaccine. You may need a Td booster every 10 years.  Zoster vaccine. You may need this after age 43.  Pneumococcal 13-valent conjugate (PCV13) vaccine.  One dose is recommended after age 83.  Pneumococcal polysaccharide (PPSV23) vaccine. One dose is recommended after age 39. Talk to your health care provider about which screenings and vaccines you need and how often you need them. This information is not intended to replace advice given to you by your health care provider. Make sure you discuss any questions you have with your health care provider. Document Released: 10/21/2015 Document Revised: 06/13/2016 Document Reviewed: 07/26/2015 Elsevier Interactive Patient Education  2017 [employer] Prevention in the Home Falls can cause injuries. They can happen to people of all ages. There are many things you can do to make your home safe and to help prevent falls. What can I do on the outside of my home?  Regularly fix the edges of walkways and driveways and fix any cracks.  Remove anything that might make you trip as you walk through a door, such as a raised step or threshold.  Trim any bushes or trees on the path to your home.  Use bright outdoor lighting.  Clear any walking paths of anything that might make someone trip, such as rocks or tools.  Regularly check to see if handrails are loose or broken. Make sure that both sides of any steps have handrails.  Any raised decks and porches should have guardrails on the edges.  Have any leaves, snow, or ice cleared regularly.  Use sand or salt on walking paths during winter.  Clean up any spills in your garage right away. This includes oil or grease spills. What can I do in the bathroom?  Use night lights.  Install grab bars by the toilet and in the tub and shower. Do not use towel bars as grab bars.  Use non-skid mats or decals in the tub or shower.  If you need to sit down in the shower, use a plastic, non-slip stool.  Keep the floor dry. Clean up any water that spills on the floor as soon as it happens.  Remove soap buildup in the tub or shower regularly.  Attach bath  mats securely with double-sided non-slip rug tape.  Do not have throw rugs and other things on the floor that can make you trip. What can I do in the bedroom?  Use night lights.  Make sure that you have a light by your bed that is easy to reach.  Do not use any sheets or blankets that are too big for your bed. They should not hang down onto the floor.  Have a firm chair that has side arms. You can use this for support while you get dressed.  Do not have throw rugs and other things on the floor that can make you trip. What can I do in the kitchen?  Clean up any spills right away.  Avoid walking on wet floors.  Keep items that you use a lot in easy-to-reach places.  If you need to reach something above you, use a strong step stool that has a grab bar.  Keep electrical cords out of the way.  Do not use floor polish or wax that makes floors slippery. If you must use wax, use non-skid floor wax.  Do not have throw rugs and other things on the floor that can make you trip. What  can I do with my stairs?  Do not leave any items on the stairs.  Make sure that there are handrails on both sides of the stairs and use them. Fix handrails that are broken or loose. Make sure that handrails are as long as the stairways.  Check any carpeting to make sure that it is firmly attached to the stairs. Fix any carpet that is loose or worn.  Avoid having throw rugs at the top or bottom of the stairs. If you do have throw rugs, attach them to the floor with carpet tape.  Make sure that you have a light switch at the top of the stairs and the bottom of the stairs. If you do not have them, ask someone to add them for you. What else can I do to help prevent falls?  Wear shoes that:  Do not have high heels.  Have rubber bottoms.  Are comfortable and fit you well.  Are closed at the toe. Do not wear sandals.  If you use a stepladder:  Make sure that it is fully opened. Do not climb a closed  stepladder.  Make sure that both sides of the stepladder are locked into place.  Ask someone to hold it for you, if possible.  Clearly mark and make sure that you can see:  Any grab bars or handrails.  First and last steps.  Where the edge of each step is.  Use tools that help you move around (mobility aids) if they are needed. These include:  Canes.  Walkers.  Scooters.  Crutches.  Turn on the lights when you go into a dark area. Replace any light bulbs as soon as they burn out.  Set up your furniture so you have a clear path. Avoid moving your furniture around.  If any of your floors are uneven, fix them.  If there are any pets around you, be aware of where they are.  Review your medicines with your doctor. Some medicines can make you feel dizzy. This can increase your chance of falling. Ask your doctor what other things that you can do to help prevent falls. This information is not intended to replace advice given to you by your health care provider. Make sure you discuss any questions you have with your health care provider. Document Released: 07/21/2009 Document Revised: 03/01/2016 Document Reviewed: 10/29/2014 Elsevier Interactive Patient Education  2017 [employer].

## 2019-07-09 LAB — CMP14+EGFR
ALT: 12 IU/L (ref 0–32)
AST: 21 IU/L (ref 0–40)
Albumin/Globulin Ratio: 1.4 (ref 1.2–2.2)
Albumin: 4.1 g/dL (ref 3.6–4.6)
Alkaline Phosphatase: 76 IU/L (ref 39–117)
BUN/Creatinine Ratio: 17 (ref 12–28)
BUN: 11 mg/dL (ref 8–27)
Bilirubin Total: 0.9 mg/dL (ref 0.0–1.2)
CO2: 24 mmol/L (ref 20–29)
Calcium: 9.4 mg/dL (ref 8.7–10.3)
Chloride: 105 mmol/L (ref 96–106)
Creatinine, Ser: 0.64 mg/dL (ref 0.57–1.00)
GFR calc Af Amer: 95 mL/min/{1.73_m2} (ref >59)
GFR calc non Af Amer: 82 mL/min/{1.73_m2} (ref >59)
Globulin, Total: 3 g/dL (ref 1.5–4.5)
Glucose: 87 mg/dL (ref 65–99)
Potassium: 4.3 mmol/L (ref 3.5–5.2)
Sodium: 141 mmol/L (ref 134–144)
Total Protein: 7.1 g/dL (ref 6.0–8.5)

## 2019-07-09 LAB — CBC
Hematocrit: 42.6 % (ref 34.0–46.6)
Hemoglobin: 14.1 g/dL (ref 11.1–15.9)
MCH: 27.9 pg (ref 26.6–33.0)
MCHC: 33.1 g/dL (ref 31.5–35.7)
MCV: 84 fL (ref 79–97)
Platelets: 308 10*3/uL (ref 150–450)
RBC: 5.06 x10E6/uL (ref 3.77–5.28)
RDW: 14.8 % (ref 11.7–15.4)
WBC: 5.1 10*3/uL (ref 3.4–10.8)

## 2019-07-09 LAB — T4, FREE: Free T4: 1.49 ng/dL (ref 0.82–1.77)

## 2019-07-09 LAB — T3, FREE: T3, Free: 2.4 pg/mL (ref 2.0–4.4)

## 2019-07-09 LAB — LIPID PANEL
Chol/HDL Ratio: 3.4 ratio (ref 0.0–4.4)
Cholesterol, Total: 234 mg/dL — ABNORMAL HIGH (ref 100–199)
HDL: 68 mg/dL (ref >39)
LDL Chol Calc (NIH): 150 mg/dL — ABNORMAL HIGH (ref 0–99)
Triglycerides: 92 mg/dL (ref 0–149)
VLDL Cholesterol Cal: 16 mg/dL (ref 5–40)

## 2019-07-09 LAB — VITAMIN B12: Vitamin B-12: 406 pg/mL (ref 232–1245)

## 2019-07-09 LAB — TSH: TSH: 0.67 u[IU]/mL (ref 0.450–4.500)

## 2019-08-04 ENCOUNTER — Other Ambulatory Visit: Payer: Self-pay | Admitting: Internal Medicine

## 2019-08-12 ENCOUNTER — Encounter: Payer: Self-pay | Admitting: Nurse Practitioner

## 2019-08-12 ENCOUNTER — Ambulatory Visit (INDEPENDENT_AMBULATORY_CARE_PROVIDER_SITE_OTHER): Payer: Medicare Other | Admitting: Nurse Practitioner

## 2019-08-12 ENCOUNTER — Other Ambulatory Visit: Payer: Self-pay

## 2019-08-12 VITALS — BP 110/64 | HR 75 | Temp 98.9°F | Ht 60.6 in | Wt 202.6 lb

## 2019-08-12 DIAGNOSIS — W57XXXA Bitten or stung by nonvenomous insect and other nonvenomous arthropods, initial encounter: Secondary | ICD-10-CM

## 2019-08-12 DIAGNOSIS — L03113 Cellulitis of right upper limb: Secondary | ICD-10-CM | POA: Diagnosis not present

## 2019-08-12 DIAGNOSIS — S50861A Insect bite (nonvenomous) of right forearm, initial encounter: Secondary | ICD-10-CM | POA: Diagnosis not present

## 2019-08-12 MED ORDER — CEFTRIAXONE SODIUM 500 MG IJ SOLR
500.0000 mg | Freq: Once | INTRAMUSCULAR | Status: AC
  Administered 2019-08-12: 500 mg via INTRAMUSCULAR

## 2019-08-12 MED ORDER — CEPHALEXIN 250 MG PO CAPS: 250.0000 mg | ORAL_CAPSULE | Freq: Four times a day (QID) | ORAL | 0 refills | Status: AC

## 2019-08-12 NOTE — Patient Instructions (Signed)

## 2019-08-12 NOTE — Progress Notes (Signed)
Subjective:     Patient ID: [name] , female    DOB: 1932/12/26 , 85 y.o.   MRN: [id number]   Chief Complaint  Patient presents with  . Insect Bite    patient stated she got bit a few days ago on her arm. her arm is red, swollen and painful     HPI  Right arm has a rash, redness and blistering.  Occurred on Saturday, she has been using alcohol and neosporin. Mild itching and is painful.  Denies fever.  She has been using ice.       Past Medical History:  Diagnosis Date  . Arthritis    left hip and back  . Bronchiectasis    isolated to RML; status post right middle lobe partial resection.  . Complication of anesthesia    hard to wake up  . Dyspnea   . Gall stones   . GERD (gastroesophageal reflux disease)    history   . H/O osteoporosis   . Hypertension   . Hyperthyroidism    endocrinologist - Dr. Chalmers Cater  . Pneumonia   . Yeast infection      Family History  Problem Relation Age of Onset  . Hypertension Mother   . CVA Mother 51  . Heart attack Sister   . Diabetes Sister   . Healthy Father   . Heart attack Brother   . Heart attack Brother      Current Outpatient Medications:  .  acetaminophen (TYLENOL 8 HOUR) 650 MG CR tablet, Take 650 mg by mouth every 8 (eight) hours as needed for pain., Disp: , Rfl:  .  aspirin (BAYER ASPIRIN) 325 MG tablet, Take 1 tablet (325 mg total) by mouth daily. (Patient taking differently: Take 81 mg by mouth daily. ), Disp: , Rfl:  .  diclofenac sodium (VOLTAREN) 1 % GEL, , Disp: , Rfl:  .  furosemide (LASIX) 20 MG tablet, Take 20 mg by mouth daily.  , Disp: , Rfl:  .  methimazole (TAPAZOLE) 10 MG tablet, Take 10 mg by mouth 2 (two) times daily. , Disp: , Rfl:  .  nystatin cream (MYCOSTATIN), APPLY CREAM TO AFFECTED AREA TWICE A DAY AS NEEDED, Disp: 30 g, Rfl: 23 .  potassium chloride SA (K-DUR,KLOR-CON) 20 MEQ tablet, Take 20 mEq by mouth daily. , Disp: , Rfl:  .  TOPROL XL 25 MG 24 hr tablet, TAKE 1 TABLET DAILY, Disp: 90  tablet, Rfl: 3   Allergies  Allergen Reactions  . Ibuprofen Other (See Comments)    hallucinations  . Naproxen Sodium Other (See Comments)    hallucinations     Review of Systems  Constitutional: Negative.   Respiratory: Negative.   Cardiovascular: Negative.  Negative for chest pain, palpitations and leg swelling.  Skin: Positive for rash (right forearm with insect bite with redness and open blister, surrounding skin is hot to touch and swollen).  Neurological: Negative for dizziness and headaches.     Today's Vitals   08/12/19 0950  BP: 110/64  Pulse: 75  Temp: 98.9 F (37.2 C)  TempSrc: Oral  Weight: 202 lb 9.6 oz (91.9 kg)  Height: 5' 0.6" (1.539 m)  PainSc: 7   PainLoc: Arm   Body mass index is 38.79 kg/m.   Objective:  Physical Exam Constitutional:      Appearance: Normal appearance.  Cardiovascular:     Rate and Rhythm: Normal rate and regular rhythm.     Pulses: Normal pulses.  Heart sounds: Normal heart sounds. No murmur.  Pulmonary:     Effort: Pulmonary effort is normal. No respiratory distress.     Breath sounds: Normal breath sounds.  Skin:    Capillary Refill: Capillary refill takes less than 2 seconds.     Findings: Erythema present.     Comments: Right forearm with erythema and hot to touch with open blister  Neurological:     General: No focal deficit present.     Mental Status: She is alert and oriented to person, place, and time.         Assessment And Plan:     1. Cellulitis of right upper extremity  Right forearm with erythema, hot to touch and an open blister  Applied neosporin and bandaid to area  Rocephin 500 mg IM given in office - cefTRIAXone (ROCEPHIN) injection 500 mg - cephALEXin (KEFLEX) 250 MG capsule; Take 1 capsule (250 mg total) by mouth 4 (four) times daily for 10 days.  Dispense: 40 capsule; Refill: 0  2. Insect bite of right forearm, initial encounter  Open blister to right forearm  Minette Brine, FNP     THE PATIENT IS ENCOURAGED TO PRACTICE SOCIAL DISTANCING DUE TO THE COVID-19 PANDEMIC.

## 2019-08-18 DIAGNOSIS — E0590 Thyrotoxicosis, unspecified without thyrotoxic crisis or storm: Secondary | ICD-10-CM | POA: Diagnosis not present

## 2019-08-21 ENCOUNTER — Other Ambulatory Visit: Payer: Self-pay

## 2019-08-21 ENCOUNTER — Encounter: Payer: Self-pay | Admitting: Orthopaedic Surgery

## 2019-08-21 ENCOUNTER — Ambulatory Visit (INDEPENDENT_AMBULATORY_CARE_PROVIDER_SITE_OTHER): Payer: Medicare Other | Admitting: Orthopaedic Surgery

## 2019-08-21 VITALS — BP 133/88 | HR 76 | Ht 60.0 in | Wt 202.0 lb

## 2019-08-21 DIAGNOSIS — Z96641 Presence of right artificial hip joint: Secondary | ICD-10-CM

## 2019-08-21 DIAGNOSIS — M1711 Unilateral primary osteoarthritis, right knee: Secondary | ICD-10-CM | POA: Diagnosis not present

## 2019-08-21 DIAGNOSIS — M1732 Unilateral post-traumatic osteoarthritis, left knee: Secondary | ICD-10-CM | POA: Diagnosis not present

## 2019-08-21 NOTE — Progress Notes (Signed)
Office Visit Note   Patient: [name]           Date of Birth: 1933/05/10           MRN: [id number] Visit Date: 08/21/2019              Requested by: Glendale Chard, [address] [place],  [place] [address] PCP: Glendale Chard, MD   Assessment & Plan: Visit Diagnoses:  1. Unilateral primary osteoarthritis, right knee   2. Post-traumatic osteoarthritis of left knee   3. History of total hip arthroplasty, right     Plan: Patient is happy with her hip procedures both right and left.  She has right greater than left knee osteoarthritis and will call when she has increased problems difficulty ambulating and wants to consider total knee arthroplasty.  Follow-Up Instructions: Return if symptoms worsen or fail to improve.   Orders:  No orders of the defined types were placed in this encounter.  No orders of the defined types were placed in this encounter.     Procedures: No procedures performed   Clinical Data: No additional findings.   Subjective: Chief Complaint  Patient presents with  . Left Knee - Pain, Follow-up  . Right Knee - Pain, Follow-up  . Right Hip - Follow-up    12/19/2018 Right THA    HPI 85 year old female returns post bilateral total hip arthroplasties.  She is walking well with her hips was have problems with bilateral knee osteoarthritis.  Previous tibial plateau on the left treated with a single pin right knee has varus deformity bone-on-bone medial compartment with marginal osteophytes.  She ambulates with a cane.  Recent problems with left forearm subcutaneous abscess treated with cephalexin and intermittent muscular antibiotic injection she thinks it was a bug bite and she is gotten significant improvement and is finishing up the antibiotics.  Previous PCR tests before hip surgeries were all negative for MRSA.  Review of Systems Unchanged other than as mentioned in HPI.  Objective: Vital Signs: BP 133/88   Pulse 76   Ht 5'  (1.524 m)   Wt 202 lb (91.6 kg)   BMI 39.45 kg/m   Physical Exam Constitutional:      Appearance: She is well-developed.  HENT:     Head: Normocephalic.     Right Ear: External ear normal.     Left Ear: External ear normal.  Eyes:     Pupils: Pupils are equal, round, and reactive to light.  Neck:     Thyroid: No thyromegaly.     Trachea: No tracheal deviation.  Cardiovascular:     Rate and Rhythm: Normal rate.  Pulmonary:     Effort: Pulmonary effort is normal.  Abdominal:     Palpations: Abdomen is soft.  Skin:    General: Skin is warm and dry.  Neurological:     Mental Status: She is alert and oriented to person, place, and time.  Psychiatric:        Behavior: Behavior normal.     Ortho Exam well-healed right and left anterior hip incisions.  She has crepitus with knee range of motion right and left more pain on the right than left knee.  Left forearm shows 1 cm area where she has some central necrosis subcutaneous abscess which is drained with a dime spot area of drainage on the 2 x 2 in the last 24 hours.  No epitrochlear nodes good elbow range of motion.  Forearm is soft  both compartments.  Specialty Comments:  No specialty comments available.  Imaging: No results found.   PMFS History: Patient Active Problem List   Diagnosis Date Noted  . History of total hip arthroplasty, right 08/21/2019  . Intermittent claudication (Nathalie) 07/08/2019  . Hypertensive nephropathy 12/15/2018  . Chronic renal disease, stage II 12/15/2018  . H/O total hip arthroplasty, left 11/07/2017  . Post-traumatic osteoarthritis of left knee 11/07/2017  . Unilateral primary osteoarthritis, right knee 11/07/2017  . Presence of left artificial hip joint 05/14/2017  . Preop cardiovascular exam 03/12/2017  . Cough   . Abdominal pain   . Symptomatic cholelithiasis 07/01/2016  . Nonspecific ST-T wave electrocardiographic changes   . Lower leg edema 02/16/2015  . Obesity (BMI 30.0-34.9)  02/16/2015  . Essential hypertension 01/11/2012  . Hyperthyroidism 01/11/2012  . Hypercholesteremia 01/11/2012  . H/O vaginal hysterectomy 1974 01/11/2012    Class: History of  . S/P removal of lung  partial R lobe  2010 01/11/2012  . History of hernia repair  R ing. 1975 01/11/2012  . BACK PAIN 11/12/2007  . DYSPNEA 11/11/2007   Past Medical History:  Diagnosis Date  . Arthritis    left hip and back  . Bronchiectasis    isolated to RML; status post right middle lobe partial resection.  . Complication of anesthesia    hard to wake up  . Dyspnea   . Gall stones   . GERD (gastroesophageal reflux disease)    history   . H/O osteoporosis   . Hypertension   . Hyperthyroidism    endocrinologist - Dr. Chalmers Cater  . Pneumonia   . Yeast infection     Family History  Problem Relation Age of Onset  . Hypertension Mother   . CVA Mother 62  . Heart attack Sister   . Diabetes Sister   . Healthy Father   . Heart attack Brother   . Heart attack Brother     Past Surgical History:  Procedure Laterality Date  . ABDOMINAL HYSTERECTOMY  1974  . CHOLECYSTECTOMY N/A 07/02/2016   Procedure: LAPAROSCOPIC CHOLECYSTECTOMY;  Surgeon: Stark Klein, MD;  Location: [facility];  Service: General;  Laterality: N/A;  . COLONOSCOPY    . ENDOSCOPIC RETROGRADE CHOLANGIOPANCREATOGRAPHY (ERCP) WITH PROPOFOL N/A 08/07/2018   Procedure: ENDOSCOPIC RETROGRADE CHOLANGIOPANCREATOGRAPHY (ERCP) WITH PROPOFOL;  Surgeon: Carol Ada, MD;  Location: [facility];  Service: Endoscopy;  Laterality: N/A;  . EYE SURGERY     stye removed  . HERNIA REPAIR  1975  . KNEE SURGERY Left   . LUNG REMOVAL, PARTIAL  208   RML  . NM MYOVIEW LTD  06/2011   Normal EF. No ischemia or infarction.  [name]  08/07/2018   Procedure: [name];  Surgeon: Carol Ada, MD;  Location: [facility];  Service: Endoscopy;;  balloon sweep  . TOTAL HIP ARTHROPLASTY Left 04/05/2017   Procedure: LEFT TOTAL HIP ARTHROPLASTY ANTERIOR  APPROACH;  Surgeon: [name], MD;  Location: [facility];  Service: Orthopedics;  Laterality: Left;  . TOTAL HIP ARTHROPLASTY Right 12/19/2018  . TOTAL HIP ARTHROPLASTY Right 12/19/2018   Procedure: RIGHT TOTAL HIP ARTHROPLASTY-DIRECT ANTERIOR APPROACH;  Surgeon: [name], MD;  Location: [facility];  Service: Orthopedics;  Laterality: Right;  . TRANSTHORACIC ECHOCARDIOGRAM  06/2011   Mild concentric LVH. Normal EF with impaired relaxation. Mildly elevated RV pressures of 30 and 40 mmHg.  Marland Kitchen TRANSTHORACIC ECHOCARDIOGRAM  06/2016   normal LV size and function. EF 60-65% with GRD 2 DD. Mild  aortic stenosis. Mild LA dilation. Mild to moderately increased PA pressures (46 mmHg).  . TUBAL LIGATION     Social History   Occupational History  . Occupation: retired  Tobacco Use  . Smoking status: Never Smoker  . Smokeless tobacco: Never Used  Substance and Sexual Activity  . Alcohol use: No  . Drug use: No  . Sexual activity: Not Currently

## 2019-08-25 DIAGNOSIS — E0590 Thyrotoxicosis, unspecified without thyrotoxic crisis or storm: Secondary | ICD-10-CM | POA: Diagnosis not present

## 2019-08-26 DIAGNOSIS — Z90710 Acquired absence of both cervix and uterus: Secondary | ICD-10-CM | POA: Diagnosis not present

## 2019-08-26 DIAGNOSIS — M810 Age-related osteoporosis without current pathological fracture: Secondary | ICD-10-CM | POA: Diagnosis not present

## 2019-08-26 DIAGNOSIS — Z1231 Encounter for screening mammogram for malignant neoplasm of breast: Secondary | ICD-10-CM | POA: Diagnosis not present

## 2019-08-26 DIAGNOSIS — Z96643 Presence of artificial hip joint, bilateral: Secondary | ICD-10-CM | POA: Diagnosis not present

## 2019-08-26 LAB — HM DEXA SCAN

## 2019-08-26 LAB — HM MAMMOGRAPHY

## 2019-08-31 ENCOUNTER — Encounter: Payer: Self-pay | Admitting: Internal Medicine

## 2019-09-08 ENCOUNTER — Encounter: Payer: Self-pay | Admitting: Internal Medicine

## 2019-09-09 ENCOUNTER — Other Ambulatory Visit: Payer: Self-pay

## 2019-09-18 ENCOUNTER — Other Ambulatory Visit: Payer: Self-pay

## 2019-09-18 ENCOUNTER — Telehealth: Payer: Self-pay

## 2019-09-18 DIAGNOSIS — M810 Age-related osteoporosis without current pathological fracture: Secondary | ICD-10-CM

## 2019-09-18 NOTE — Telephone Encounter (Signed)
The pt was asked if she was ok with a referral to Dr. Estanislado Pandy for treatment of osteoporosis and the pt said yes and that she was going to discuss all options for treatment, not just the prolia.

## 2019-10-14 ENCOUNTER — Encounter: Payer: Self-pay | Admitting: Internal Medicine

## 2019-11-09 ENCOUNTER — Encounter: Payer: Self-pay | Admitting: Internal Medicine

## 2019-11-09 ENCOUNTER — Telehealth (INDEPENDENT_AMBULATORY_CARE_PROVIDER_SITE_OTHER): Payer: Medicare Other | Admitting: Internal Medicine

## 2019-11-09 ENCOUNTER — Other Ambulatory Visit: Payer: Self-pay

## 2019-11-09 VITALS — Temp 96.0°F | Ht 61.0 in

## 2019-11-09 DIAGNOSIS — F419 Anxiety disorder, unspecified: Secondary | ICD-10-CM

## 2019-11-09 MED ORDER — BUSPIRONE HCL 5 MG PO TABS: 5.0000 mg | ORAL_TABLET | Freq: Two times a day (BID) | ORAL | 1 refills | Status: DC

## 2019-11-15 NOTE — Patient Instructions (Signed)

## 2019-11-15 NOTE — Progress Notes (Signed)
Virtual Visit via Phone   This visit type was conducted due to national recommendations for restrictions regarding the COVID-19 Pandemic (e.g. social distancing) in an effort to limit this patient's exposure and mitigate transmission in our community.  Due to her co-morbid illnesses, this patient is at least at moderate risk for complications without adequate follow up.  This format is felt to be most appropriate for this patient at this time.  All issues noted in this document were discussed and addressed.  A limited physical exam was performed with this format.    This visit type was conducted due to national recommendations for restrictions regarding the COVID-19 Pandemic (e.g. social distancing) in an effort to limit this patient's exposure and mitigate transmission in our community.  Patients identity confirmed using two different identifiers.  This format is felt to be most appropriate for this patient at this time.  All issues noted in this document were discussed and addressed.  No physical exam was performed (except for noted visual exam findings with Video Visits).    Date:  11/15/2019   ID:  Smith, Savannah Feb 25, 1933, MRN [id number]  Patient Location:  Home  Provider location:   Office    Chief Complaint:  "I have anxiety"  History of Present Illness:    [name] is a 86 y.o. female who presents via video conferencing for a telehealth visit today.    The patient does not have symptoms concerning for COVID-19 infection (fever, chills, cough, or new shortness of breath).   She presents today for virtual visit. She prefers this method of contact due to COVID-19 pandemic.  She presents today for further evaluation of anxiety. She reports that she is stressed b/c her daughter recently had stroke. She is being transferred to a skilled nursing facility for rehab. She is anxious b/c she will not be able to visit her daughter while in rehab. She is trying to get some things  together now to take to SNF for her daughter. She is also concerned b/c she has not been able to locate her grandson to notify him of recent events. She would like to get rx to help with her anxiety.   Anxiety Presents for initial visit. Symptoms include nervous/anxious behavior and panic. Patient reports no compulsions, confusion, palpitations or shortness of breath. The severity of symptoms is moderate. The symptoms are aggravated by family issues. The quality of sleep is fair.   There is no history of depression, fibromyalgia or suicide attempts. Past treatments include nothing.     Past Medical History:  Diagnosis Date  . Arthritis    left hip and back  . Bronchiectasis    isolated to RML; status post right middle lobe partial resection.  . Complication of anesthesia    hard to wake up  . Dyspnea   . Gall stones   . GERD (gastroesophageal reflux disease)    history   . H/O osteoporosis   . Hypertension   . Hyperthyroidism    endocrinologist - Dr. Chalmers Cater  . Pneumonia   . Yeast infection    Past Surgical History:  Procedure Laterality Date  . ABDOMINAL HYSTERECTOMY  1974  . CHOLECYSTECTOMY N/A 07/02/2016   Procedure: LAPAROSCOPIC CHOLECYSTECTOMY;  Surgeon: Stark Klein, MD;  Location: [facility];  Service: General;  Laterality: N/A;  . COLONOSCOPY    . ENDOSCOPIC RETROGRADE CHOLANGIOPANCREATOGRAPHY (ERCP) WITH PROPOFOL N/A 08/07/2018   Procedure: ENDOSCOPIC RETROGRADE CHOLANGIOPANCREATOGRAPHY (ERCP) WITH PROPOFOL;  Surgeon: Carol Ada, MD;  Location: [facility];  Service: Endoscopy;  Laterality: N/A;  . EYE SURGERY     stye removed  . HERNIA REPAIR  1975  . KNEE SURGERY Left   . LUNG REMOVAL, PARTIAL  208   RML  . NM MYOVIEW LTD  06/2011   Normal EF. No ischemia or infarction.  [name]  08/07/2018   Procedure: [name];  Surgeon: Carol Ada, MD;  Location: [facility];  Service: Endoscopy;;  balloon sweep  . TOTAL HIP ARTHROPLASTY Left 04/05/2017    Procedure: LEFT TOTAL HIP ARTHROPLASTY ANTERIOR APPROACH;  Surgeon: [name], MD;  Location: [facility];  Service: Orthopedics;  Laterality: Left;  . TOTAL HIP ARTHROPLASTY Right 12/19/2018  . TOTAL HIP ARTHROPLASTY Right 12/19/2018   Procedure: RIGHT TOTAL HIP ARTHROPLASTY-DIRECT ANTERIOR APPROACH;  Surgeon: [name], MD;  Location: [place];  Service: Orthopedics;  Laterality: Right;  . TRANSTHORACIC ECHOCARDIOGRAM  06/2011   Mild concentric LVH. Normal EF with impaired relaxation. Mildly elevated RV pressures of 30 and 40 mmHg.  Marland Kitchen TRANSTHORACIC ECHOCARDIOGRAM  06/2016   normal LV size and function. EF 60-65% with GRD 2 DD. Mild aortic stenosis. Mild LA dilation. Mild to moderately increased PA pressures (46 mmHg).  . TUBAL LIGATION       Current Meds  Medication Sig  . acetaminophen (TYLENOL 8 HOUR) 650 MG CR tablet Take 650 mg by mouth every 8 (eight) hours as needed for pain.  Marland Kitchen aspirin (BAYER ASPIRIN) 325 MG tablet Take 1 tablet (325 mg total) by mouth daily. (Patient taking differently: Take 81 mg by mouth daily. )  . calcium carbonate (CALCIUM 600) 600 MG TABS tablet Take 600 mg by mouth daily.  . cholecalciferol (VITAMIN D3) 25 MCG (1000 UT) tablet Take 1,000 Units by mouth daily.  . diclofenac sodium (VOLTAREN) 1 % GEL   . furosemide (LASIX) 20 MG tablet Take 20 mg by mouth daily.    . methimazole (TAPAZOLE) 5 MG tablet Take 10 mg by mouth 2 (two) times daily. 1/2 daily  . nystatin cream (MYCOSTATIN) APPLY CREAM TO AFFECTED AREA TWICE A DAY AS NEEDED  . potassium chloride SA (K-DUR,KLOR-CON) 20 MEQ tablet Take 20 mEq by mouth daily.   . TOPROL XL 25 MG 24 hr tablet TAKE 1 TABLET DAILY     Allergies:   Ibuprofen and Naproxen sodium   Social History   Tobacco Use  . Smoking status: Never Smoker  . Smokeless tobacco: Never Used  Substance Use Topics  . Alcohol use: No  . Drug use: No     Family Hx: The patient's family history includes CVA (age of onset: 63) in her  mother; Diabetes in her sister; Healthy in her father; Heart attack in her brother, brother, and sister; Hypertension in her mother.  ROS:   Please see the history of present illness.    Review of Systems  Constitutional: Negative.   Respiratory: Negative.  Negative for shortness of breath.   Cardiovascular: Negative.  Negative for palpitations.  Gastrointestinal: Negative.   Neurological: Negative.   Psychiatric/Behavioral: Negative for confusion. The patient is nervous/anxious.     All other systems reviewed and are negative.   Labs/Other Tests and Data Reviewed:    Recent Labs: 07/08/2019: ALT 12; BUN 11; Creatinine, Ser 0.64; Hemoglobin 14.1; Platelets 308; Potassium 4.3; Sodium 141; TSH 0.670   Recent Lipid Panel Lab Results  Component Value Date/Time   CHOL 234 (H) 07/08/2019 11:13 AM   TRIG 92 07/08/2019 11:13 AM  HDL 68 07/08/2019 11:13 AM   CHOLHDL 3.4 07/08/2019 11:13 AM   LDLCALC 150 (H) 07/08/2019 11:13 AM    Wt Readings from Last 3 Encounters:  08/21/19 202 lb (91.6 kg)  08/12/19 202 lb 9.6 oz (91.9 kg)  07/08/19 197 lb 3.2 oz (89.4 kg)     Exam:    Vital Signs:  Temp (!) 96 F (35.6 C) Comment: pt provided  Ht 5\' 1"  (1.549 m)   BMI 38.17 kg/m     Physical Exam NOT performed due to nature of visit.  She is able to speak in full sentences without SOB.   ASSESSMENT & PLAN:     1. Anxiety  She was given rx buspar 5mg  twice daily as needed.  Possible side effects were discussed with the patient.  She has tolerated this in the past. She is advised to use as needed. Pt is encouraged to notify me if she has worsening symptoms. She agrees to f/u in four weeks for re-evaluation.     COVID-19 Education: The signs and symptoms of COVID-19 were discussed with the patient and how to seek care for testing (follow up with PCP or arrange E-visit).  The importance of social distancing was discussed today.  Patient Risk:   After full review of this patients  clinical status, I feel that they are at least moderate risk at this time.  Time:   Today, I have spent 14 minutes/ seconds with the patient with telehealth technology discussing above diagnoses.     Medication Adjustments/Labs and Tests Ordered: Current medicines are reviewed at length with the patient today.  Concerns regarding medicines are outlined above.   Tests Ordered: No orders of the defined types were placed in this encounter.   Medication Changes: Meds ordered this encounter  Medications  . busPIRone (BUSPAR) 5 MG tablet    Sig: Take 1 tablet (5 mg total) by mouth 2 (two) times daily.    Dispense:  60 tablet    Refill:  1    Disposition:  Follow up in 4 week(s)  Signed, Maximino Greenland, MD

## 2019-11-17 NOTE — Progress Notes (Signed)
Virtual Visit via Telephone Note   This visit type was conducted due to national recommendations for restrictions regarding the COVID-19 Pandemic (e.g. social distancing) in an effort to limit this patient's exposure and mitigate transmission in our community.  Due to her co-morbid illnesses, this patient is at least at moderate risk for complications without adequate follow up.  This format is felt to be most appropriate for this patient at this time.  The patient did not have access to video technology/had technical difficulties with video requiring transitioning to audio format only (telephone).  All issues noted in this document were discussed and addressed.  No physical exam could be performed with this format.  Please refer to the patient's chart for her  consent to telehealth for [facility].   Date:  11/18/2019   ID:  [name], [name] [birth year], MRN [id number]  Patient Location: Home Provider Location: Home  PCP:  Glendale Chard, MD  Cardiologist:  Glenetta Hew, MD Electrophysiologist:  None   Evaluation Performed:  Follow-Up Visit  Chief Complaint:  Follow up  History of Present Illness:    [name] is a 86 y.o. female we are following for ongoing assessment and management of chronic chest pain which is felt to be musculoskeletal, chronic dyspnea and fatigue.  The patient had a normal stress test and echocardiogram in September 2012.  Echocardiogram revealed normal EF of 60% to 65% with grade 2 diastolic dysfunction, mild aortic stenosis, mild LA dilatation, mild to moderately increased PA pressures.  She was last seen in the office for pre-operative evaluation for right hip replacement. She was also referred to GI by PCP due to elevated LFT's. She did not have hip replacement but did come back to the office and saw Dr. Claiborne Billings, who was DOD on 12/10/2018 for pre-operative clearance to go ahead and proceed with surgery.   Other history includes anxiety, hypertension,  hypothyroidism, chronic dyspnea.   She is doing well today via telephone visit. She had her hip replacement and is now having trouble with her knees. She is medically compliant, without complaints of chest pain, only mild dyspnea when climbing stairs. She is followed by PCP for labs and medication refills. She has some post nasal drip issues when she first awakens in the morning, having to clear her throat a lot. Better when she takes OTC antihistamine.   The patient does have symptoms concerning for COVID-19 infection (fever, chills, cough, or new shortness of breath).    Past Medical History:  Diagnosis Date  . Arthritis    left hip and back  . Bronchiectasis    isolated to RML; status post right middle lobe partial resection.  . Complication of anesthesia    hard to wake up  . Dyspnea   . Gall stones   . GERD (gastroesophageal reflux disease)    history   . H/O osteoporosis   . Hypertension   . Hyperthyroidism    endocrinologist - Dr. Chalmers Cater  . Pneumonia   . Yeast infection    Past Surgical History:  Procedure Laterality Date  . ABDOMINAL HYSTERECTOMY  1974  . CHOLECYSTECTOMY N/A 07/02/2016   Procedure: LAPAROSCOPIC CHOLECYSTECTOMY;  Surgeon: Stark Klein, MD;  Location: [facility];  Service: General;  Laterality: N/A;  . COLONOSCOPY    . ENDOSCOPIC RETROGRADE CHOLANGIOPANCREATOGRAPHY (ERCP) WITH PROPOFOL N/A 08/07/2018   Procedure: ENDOSCOPIC RETROGRADE CHOLANGIOPANCREATOGRAPHY (ERCP) WITH PROPOFOL;  Surgeon: Carol Ada, MD;  Location: [facility];  Service: Endoscopy;  Laterality: N/A;  .  EYE SURGERY     stye removed  . HERNIA REPAIR  1975  . KNEE SURGERY Left   . LUNG REMOVAL, PARTIAL  208   RML  . NM MYOVIEW LTD  06/2011   Normal EF. No ischemia or infarction.  [name]  08/07/2018   Procedure: [name];  Surgeon: Carol Ada, MD;  Location: [facility];  Service: Endoscopy;;  balloon sweep  . TOTAL HIP ARTHROPLASTY Left 04/05/2017   Procedure: LEFT  TOTAL HIP ARTHROPLASTY ANTERIOR APPROACH;  Surgeon: [name], MD;  Location: [facility];  Service: Orthopedics;  Laterality: Left;  . TOTAL HIP ARTHROPLASTY Right 12/19/2018  . TOTAL HIP ARTHROPLASTY Right 12/19/2018   Procedure: RIGHT TOTAL HIP ARTHROPLASTY-DIRECT ANTERIOR APPROACH;  Surgeon: [name], MD;  Location: [facility];  Service: Orthopedics;  Laterality: Right;  . TRANSTHORACIC ECHOCARDIOGRAM  06/2011   Mild concentric LVH. Normal EF with impaired relaxation. Mildly elevated RV pressures of 30 and 40 mmHg.  Marland Kitchen TRANSTHORACIC ECHOCARDIOGRAM  06/2016   normal LV size and function. EF 60-65% with GRD 2 DD. Mild aortic stenosis. Mild LA dilation. Mild to moderately increased PA pressures (46 mmHg).  . TUBAL LIGATION       Current Meds  Medication Sig  . acetaminophen (TYLENOL 8 HOUR) 650 MG CR tablet Take 650 mg by mouth every 8 (eight) hours as needed for pain.  . Ascorbic Acid 500 MG CHEW Chew 1 tablet by mouth daily.  . busPIRone (BUSPAR) 5 MG tablet Take 1 tablet (5 mg total) by mouth 2 (two) times daily.  . calcium carbonate (CALCIUM 600) 600 MG TABS tablet Take 600 mg by mouth daily.  . cholecalciferol (VITAMIN D3) 25 MCG (1000 UT) tablet Take 1,000 Units by mouth daily.  . diclofenac sodium (VOLTAREN) 1 % GEL   . furosemide (LASIX) 20 MG tablet Take 20 mg by mouth daily.    . methimazole (TAPAZOLE) 5 MG tablet Take 2.5 mg by mouth daily.   Marland Kitchen nystatin cream (MYCOSTATIN) APPLY CREAM TO AFFECTED AREA TWICE A DAY AS NEEDED  . potassium chloride SA (K-DUR,KLOR-CON) 20 MEQ tablet Take 20 mEq by mouth daily.   . TOPROL XL 25 MG 24 hr tablet TAKE 1 TABLET DAILY  . TRICOR 145 MG tablet Take 145 mg by mouth daily.     Allergies:   Ibuprofen and Naproxen sodium   Social History   Tobacco Use  . Smoking status: Never Smoker  . Smokeless tobacco: Never Used  Substance Use Topics  . Alcohol use: No  . Drug use: No     Family Hx: The patient's family history includes CVA (age of  onset: 64) in her mother; Diabetes in her sister; Healthy in her father; Heart attack in her brother, brother, and sister; Hypertension in her mother.  ROS:   Please see the history of present illness.    All other systems reviewed and are negative.   Prior CV studies:   The following studies were reviewed today: Echocardiogram 07/05/2016  - Left ventricle: The cavity size was normal. Systolic function was normal. The estimated ejection fraction was in the range of 60% to 65%. Wall motion was normal; there were no regional wall motion abnormalities. Features are consistent with a pseudonormal left ventricular filling pattern, with concomitant abnormal relaxation and increased filling pressure (grade 2 diastolic dysfunction). - Aortic valve: There was very mild stenosis. Valve area (VTI): 2.01 cm^2. Valve area (Vmax): 2.09 cm^2. Valve area (Vmean): 1.96 cm^2. - Mitral valve:  Calcified annulus. - Left atrium: The atrium was mildly dilated. - Pulmonary arteries: Systolic pressure was mildly to moderately increased. PA peak pressure: 46 mm Hg (S).   Labs/Other Tests and Data Reviewed:    EKG:  No ECG reviewed.  Recent Labs: 07/08/2019: ALT 12; BUN 11; Creatinine, Ser 0.64; Hemoglobin 14.1; Platelets 308; Potassium 4.3; Sodium 141; TSH 0.670   Recent Lipid Panel Lab Results  Component Value Date/Time   CHOL 234 (H) 07/08/2019 11:13 AM   TRIG 92 07/08/2019 11:13 AM   HDL 68 07/08/2019 11:13 AM   CHOLHDL 3.4 07/08/2019 11:13 AM   LDLCALC 150 (H) 07/08/2019 11:13 AM    Wt Readings from Last 3 Encounters:  11/18/19 200 lb (90.7 kg)  08/21/19 202 lb (91.6 kg)  08/12/19 202 lb 9.6 oz (91.9 kg)     Objective:    Vital Signs:  BP 123/77   Pulse (!) 52   Ht 5\' 1"  (1.549 m)   Wt 200 lb (90.7 kg)   BMI 37.79 kg/m    VITAL SIGNS:  reviewed  No assessment completed as this is telephone visit.   ASSESSMENT & PLAN:    1. Hypertension: Well controlled. She  will continue current medication regimen and follow up with PCP for labs as she is managing this every 6 months.   2. Diastolic CHF:  No complaints of fluid overload or edema. She is medically compliant. May need follow up Echo on next visit.   3. Hyperthyroidism: Followed by Endocrinology. Labs and assessment every 6 months as reported by patient.   COVID-19 Education: The signs and symptoms of COVID-19 were discussed with the patient and how to seek care for testing (follow up with PCP or arrange E-visit).  The importance of social distancing was discussed today. She is waiting get her appointment for her vaccine. On the list.   Time:   Today, I have spent 15 minutes with the patient with telehealth technology discussing the above problems.     Medication Adjustments/Labs and Tests Ordered: Current medicines are reviewed at length with the patient today.  Concerns regarding medicines are outlined above.   Tests Ordered: No orders of the defined types were placed in this encounter.   Medication Changes: No orders of the defined types were placed in this encounter.   Disposition:  Follow up one year.   Signed, Phill Myron. West Pugh, ANP, AACC  11/18/2019 9:25 AM    [facility]

## 2019-11-18 ENCOUNTER — Telehealth (INDEPENDENT_AMBULATORY_CARE_PROVIDER_SITE_OTHER): Payer: Medicare Other | Admitting: Adult Health

## 2019-11-18 ENCOUNTER — Encounter: Payer: Self-pay | Admitting: Adult Health

## 2019-11-18 VITALS — BP 123/77 | HR 52 | Ht 61.0 in | Wt 200.0 lb

## 2019-11-18 DIAGNOSIS — E0590 Thyrotoxicosis, unspecified without thyrotoxic crisis or storm: Secondary | ICD-10-CM

## 2019-11-18 DIAGNOSIS — I10 Essential (primary) hypertension: Secondary | ICD-10-CM | POA: Diagnosis not present

## 2019-11-18 DIAGNOSIS — I5032 Chronic diastolic (congestive) heart failure: Secondary | ICD-10-CM

## 2019-11-18 DIAGNOSIS — I350 Nonrheumatic aortic (valve) stenosis: Secondary | ICD-10-CM

## 2019-11-18 NOTE — Patient Instructions (Signed)
Medication Instructions:  Continue current medications  *If you need a refill on your cardiac medications before your next appointment, please call your pharmacy*  Lab Work: None Ordered  Testing/Procedures: None Ordered  Follow-Up: At [facility], you and your health needs are our priority.  As part of our continuing mission to provide you with exceptional heart care, we have created designated Provider Care Teams.  These Care Teams include your primary Cardiologist (physician) and Advanced Practice Providers (APPs -  Physician Assistants and Nurse Practitioners) who all work together to provide you with the care you need, when you need it.  Your next appointment:   1 year(s)  The format for your next appointment:   In Person  Provider:   You may see Glenetta Hew, MD or one of the following Advanced Practice Providers on your designated Care Team:    Rosaria Ferries, PA-C  Jory Sims, DNP, ANP  Cadence Kathlen Mody, NP

## 2019-12-04 ENCOUNTER — Ambulatory Visit: Payer: Medicare Other

## 2019-12-05 ENCOUNTER — Ambulatory Visit: Payer: Medicare Other | Attending: Internal Medicine

## 2019-12-05 DIAGNOSIS — Z23 Encounter for immunization: Secondary | ICD-10-CM

## 2019-12-05 NOTE — Progress Notes (Signed)
   Covid-19 Vaccination Clinic  Name:  [name]    MRN: [id number] DOB: June 26, 1933  12/05/2019  [name] was observed post Covid-19 immunization for 15 minutes without incidence. She was provided with Vaccine Information Sheet and instruction to access the V-Safe system.   [name] was instructed to call 911 with any severe reactions post vaccine: Marland Kitchen Difficulty breathing  . Swelling of your face and throat  . A fast heartbeat  . A bad rash all over your body  . Dizziness and weakness    Immunizations Administered    Name Date Dose VIS Date Route   Pfizer COVID-19 Vaccine 12/05/2019  3:26 PM 0.3 mL 09/18/2019 Intramuscular   Manufacturer: [place]   Lot: [id number]   [place]: [id number]

## 2019-12-23 ENCOUNTER — Telehealth: Payer: Self-pay | Admitting: *Deleted

## 2019-12-23 NOTE — Telephone Encounter (Signed)
1 year Ortho bundle call completed. ?

## 2019-12-30 ENCOUNTER — Ambulatory Visit: Payer: Medicare Other | Attending: Internal Medicine

## 2019-12-30 DIAGNOSIS — Z23 Encounter for immunization: Secondary | ICD-10-CM

## 2019-12-30 NOTE — Progress Notes (Signed)
   Covid-19 Vaccination Clinic  Name:  [name]    MRN: [id number] DOB: September 14, 1933  12/30/2019  [name] was observed post Covid-19 immunization for 15 minutes without incident. She was provided with Vaccine Information Sheet and instruction to access the V-Safe system.   [name] was instructed to call 911 with any severe reactions post vaccine: Marland Kitchen Difficulty breathing  . Swelling of face and throat  . A fast heartbeat  . A bad rash all over body  . Dizziness and weakness   Immunizations Administered    Name Date Dose VIS Date Route   Pfizer COVID-19 Vaccine 12/30/2019  1:33 PM 0.3 mL 09/18/2019 Intramuscular   Manufacturer: [place]   Lot: [id number]   [place]: [id number]

## 2020-01-05 ENCOUNTER — Other Ambulatory Visit: Payer: Self-pay

## 2020-01-05 ENCOUNTER — Encounter: Payer: Self-pay | Admitting: Internal Medicine

## 2020-01-05 ENCOUNTER — Ambulatory Visit (INDEPENDENT_AMBULATORY_CARE_PROVIDER_SITE_OTHER): Payer: Medicare Other | Admitting: Internal Medicine

## 2020-01-05 VITALS — BP 128/86 | HR 60 | Temp 98.2°F | Ht 60.8 in | Wt 197.4 lb

## 2020-01-05 DIAGNOSIS — N182 Chronic kidney disease, stage 2 (mild): Secondary | ICD-10-CM

## 2020-01-05 DIAGNOSIS — Z6837 Body mass index (BMI) 37.0-37.9, adult: Secondary | ICD-10-CM

## 2020-01-05 DIAGNOSIS — I129 Hypertensive chronic kidney disease with stage 1 through stage 4 chronic kidney disease, or unspecified chronic kidney disease: Secondary | ICD-10-CM

## 2020-01-05 DIAGNOSIS — E7800 Pure hypercholesterolemia, unspecified: Secondary | ICD-10-CM

## 2020-01-05 MED ORDER — TRICOR 145 MG PO TABS: 145.0000 mg | ORAL_TABLET | Freq: Every day | ORAL | 1 refills | Status: DC

## 2020-01-05 MED ORDER — POTASSIUM CHLORIDE CRYS ER 20 MEQ PO TBCR: 20.0000 meq | EXTENDED_RELEASE_TABLET | Freq: Every day | ORAL | 1 refills | Status: DC

## 2020-01-05 MED ORDER — METOPROLOL SUCCINATE ER 25 MG PO TB24: 25.0000 mg | ORAL_TABLET | Freq: Every day | ORAL | 3 refills | Status: DC

## 2020-01-05 NOTE — Patient Instructions (Signed)
Exercises To Do While Sitting  Exercises that you do while sitting (chair exercises) can give you many of the same benefits as full exercise. Benefits include strengthening your heart, burning calories, and keeping muscles and joints healthy. Exercise can also improve your mood and help with depression and anxiety. You may benefit from chair exercises if you are unable to do standing exercises because of:  Diabetic foot pain.  Obesity.  Illness.  Arthritis.  Recovery from surgery or injury.  Breathing problems.  Balance problems.  Another type of disability. Before starting chair exercises, check with your health care provider or a physical therapist to find out how much exercise you can tolerate and which exercises are safe for you. If your health care provider approves:  Start out slowly and build up over time. Aim to work up to about 10-20 minutes for each exercise session.  Make exercise part of your daily routine.  Drink water when you exercise. Do not wait until you are thirsty. Drink every 10-15 minutes.  Stop exercising right away if you have pain, nausea, shortness of breath, or dizziness.  If you are exercising in a wheelchair, make sure to lock the wheels.  Ask your health care provider whether you can do tai chi or yoga. Many positions in these mind-body exercises can be modified to do while seated. Warm-up Before starting other exercises: 1. Sit up as straight as you can. Have your knees bent at 90 degrees, which is the shape of the capital letter "L." Keep your feet flat on the floor. 2. Sit at the front edge of your chair, if you can. 3. Pull in (tighten) the muscles in your abdomen and stretch your spine and neck as straight as you can. Hold this position for a few minutes. 4. Breathe in and out evenly. Try to concentrate on your breathing, and relax your mind. Stretching Exercise A: Arm stretch 1. Hold your arms out straight in front of your body. 2. Bend  your hands at the wrist with your fingers pointing up, as if signaling someone to stop. Notice the slight tension in your forearms as you hold the position. 3. Keeping your arms out and your hands bent, rotate your hands outward as far as you can and hold this stretch. Aim to have your thumbs pointing up and your pinkie fingers pointing down. Slowly repeat arm stretches for one minute as tolerated. Exercise B: Leg stretch 1. If you can move your legs, try to "draw" letters on the floor with the toes of your foot. Write your name with one foot. 2. Write your name with the toes of your other foot. Slowly repeat the movements for one minute as tolerated. Exercise C: Reach for the sky 1. Reach your hands as far over your head as you can to stretch your spine. 2. Move your hands and arms as if you are climbing a rope. Slowly repeat the movements for one minute as tolerated. Range of motion exercises Exercise A: Shoulder roll 1. Let your arms hang loosely at your sides. 2. Lift just your shoulders up toward your ears, then let them relax back down. 3. When your shoulders feel loose, rotate your shoulders in backward and forward circles. Do shoulder rolls slowly for one minute as tolerated. Exercise B: March in place 1. As if you are marching, pump your arms and lift your legs up and down. Lift your knees as high as you can. ? If you are unable to lift your knees,  just pump your arms and move your ankles and feet up and down. March in place for one minute as tolerated. Exercise C: Seated jumping jacks 1. Let your arms hang down straight. 2. Keeping your arms straight, lift them up over your head. Aim to point your fingers to the ceiling. 3. While you lift your arms, straighten your legs and slide your heels along the floor to your sides, as wide as you can. 4. As you bring your arms back down to your sides, slide your legs back together. ? If you are unable to use your legs, just move your arms.  Slowly repeat seated jumping jacks for one minute as tolerated. Strengthening exercises Exercise A: Shoulder squeeze 1. Hold your arms straight out from your body to your sides, with your elbows bent and your fists pointed at the ceiling. 2. Keeping your arms in the bent position, move them forward so your elbows and forearms meet in front of your face. 3. Open your arms back out as wide as you can with your elbows still bent, until you feel your shoulder blades squeezing together. Hold for 5 seconds. Slowly repeat the movements forward and backward for one minute as tolerated. Contact a health care provider if you:  Had to stop exercising due to any of the following: ? Pain. ? Nausea. ? Shortness of breath. ? Dizziness. ? Fatigue.  Have significant pain or soreness after exercising. Get help right away if you have:  Chest pain.  Difficulty breathing. These symptoms may represent a serious problem that is an emergency. Do not wait to see if the symptoms will go away. Get medical help right away. Call your local emergency services (911 in the [place] Do not drive yourself to the hospital. This information is not intended to replace advice given to you by your health care provider. Make sure you discuss any questions you have with your health care provider. Document Revised: 01/15/2019 Document Reviewed: 08/07/2017 Elsevier Patient Education  2020 [employer].

## 2020-01-05 NOTE — Progress Notes (Signed)
This visit occurred during the SARS-CoV-2 public health emergency.  Safety protocols were in place, including screening questions prior to the visit, additional usage of staff PPE, and extensive cleaning of exam room while observing appropriate contact time as indicated for disinfecting solutions.  Subjective:     Patient ID: [name] , female    DOB: November 29, 1932 , 86 y.o.   MRN: [id number]   Chief Complaint  Patient presents with  . Hypertension    HPI  She presents today for BP check. She reports compliance with meds. She feels fairly well. Still concerned about her daughter's care who is in SNF after having major stroke. She is now paralyzed on her right side.   Hypertension This is a chronic problem. The current episode started more than 1 year ago. The problem has been gradually improving since onset. The problem is controlled. Pertinent negatives include no blurred vision, chest pain, palpitations or shortness of breath. Risk factors for coronary artery disease include dyslipidemia, obesity and post-menopausal state. Past treatments include beta blockers. The current treatment provides moderate improvement. Compliance problems include exercise.      Past Medical History:  Diagnosis Date  . Arthritis    left hip and back  . Bronchiectasis    isolated to RML; status post right middle lobe partial resection.  . Complication of anesthesia    hard to wake up  . Dyspnea   . Gall stones   . GERD (gastroesophageal reflux disease)    history   . H/O osteoporosis   . Hypertension   . Hyperthyroidism    endocrinologist - Dr. Chalmers Cater  . Pneumonia   . Yeast infection      Family History  Problem Relation Age of Onset  . Hypertension Mother   . CVA Mother 8  . Heart attack Sister   . Diabetes Sister   . Healthy Father   . Heart attack Brother   . Heart attack Brother      Current Outpatient Medications:  .  acetaminophen (TYLENOL 8 HOUR) 650 MG CR tablet, Take 650 mg  by mouth every 8 (eight) hours as needed for pain., Disp: , Rfl:  .  Ascorbic Acid 500 MG CHEW, Chew 1 tablet by mouth daily., Disp: , Rfl:  .  busPIRone (BUSPAR) 5 MG tablet, Take 1 tablet (5 mg total) by mouth 2 (two) times daily., Disp: 60 tablet, Rfl: 1 .  calcium carbonate (CALCIUM 600) 600 MG TABS tablet, Take 600 mg by mouth daily., Disp: , Rfl:  .  cholecalciferol (VITAMIN D3) 25 MCG (1000 UT) tablet, Take 1,000 Units by mouth daily., Disp: , Rfl:  .  diclofenac sodium (VOLTAREN) 1 % GEL, , Disp: , Rfl:  .  furosemide (LASIX) 20 MG tablet, Take 20 mg by mouth daily.  , Disp: , Rfl:  .  methimazole (TAPAZOLE) 5 MG tablet, Take 2.5 mg by mouth daily. , Disp: , Rfl:  .  nystatin cream (MYCOSTATIN), APPLY CREAM TO AFFECTED AREA TWICE A DAY AS NEEDED, Disp: 30 g, Rfl: 23 .  metoprolol succinate (TOPROL XL) 25 MG 24 hr tablet, Take 1 tablet (25 mg total) by mouth daily., Disp: 90 tablet, Rfl: 3 .  potassium chloride SA (KLOR-CON) 20 MEQ tablet, Take 1 tablet (20 mEq total) by mouth daily., Disp: 90 tablet, Rfl: 1 .  TRICOR 145 MG tablet, Take 1 tablet (145 mg total) by mouth daily., Disp: 90 tablet, Rfl: 1   Allergies  Allergen Reactions  .  Ibuprofen Other (See Comments)    hallucinations  . Naproxen Sodium Other (See Comments)    hallucinations     Review of Systems  Constitutional: Negative.   Eyes: Negative for blurred vision.  Respiratory: Negative.  Negative for shortness of breath.   Cardiovascular: Negative.  Negative for chest pain and palpitations.  Gastrointestinal: Negative.   Neurological: Negative.   Psychiatric/Behavioral: Negative.      Today's Vitals   01/05/20 1131  BP: 128/86  Pulse: 60  Temp: 98.2 F (36.8 C)  Weight: 197 lb 6.4 oz (89.5 kg)  Height: 5' 0.8" (1.544 m)   Body mass index is 37.54 kg/m.   Objective:  Physical Exam Vitals and nursing note reviewed.  Constitutional:      Appearance: Normal appearance. She is obese.  HENT:     Head:  Normocephalic and atraumatic.  Cardiovascular:     Rate and Rhythm: Normal rate and regular rhythm.     Heart sounds: Normal heart sounds.  Pulmonary:     Effort: Pulmonary effort is normal.     Breath sounds: Normal breath sounds.  Musculoskeletal:     Comments: Ambulatory with cane  Skin:    General: Skin is warm.  Neurological:     General: No focal deficit present.     Mental Status: She is alert.  Psychiatric:        Mood and Affect: Mood normal.        Behavior: Behavior normal.         Assessment And Plan:     1. Hypertensive nephropathy  Chronic, well controlled. She will continue with current meds. She is encouraged to avoid adding salt to her foods. I will check renal function.   - BMP8+EGFR  2. Chronic renal disease, stage II  Chronic. She is encouraged to stay well hydrated.   3. Pure hypercholesterolemia  Chronic, I will check fasting lipid panel. She is encouraged to avoid fried foods and to incorporate more exercise into her daily routine.   - Lipid panel  4. Class 2 severe obesity due to excess calories with serious comorbidity and body mass index (BMI) of 37.0 to 37.9 in adult [facility])  She is encouraged to strive for BMI less than 30 to decrease cardiac risk. She is encouraged to perform chair exercises while watching TV.   Maximino Greenland, MD    THE PATIENT IS ENCOURAGED TO PRACTICE SOCIAL DISTANCING DUE TO THE COVID-19 PANDEMIC.

## 2020-01-06 LAB — BMP8+EGFR
BUN/Creatinine Ratio: 11 — ABNORMAL LOW (ref 12–28)
BUN: 10 mg/dL (ref 8–27)
CO2: 23 mmol/L (ref 20–29)
Calcium: 9.5 mg/dL (ref 8.7–10.3)
Chloride: 105 mmol/L (ref 96–106)
Creatinine, Ser: 0.88 mg/dL (ref 0.57–1.00)
GFR calc Af Amer: 70 mL/min/{1.73_m2} (ref >59)
GFR calc non Af Amer: 61 mL/min/{1.73_m2} (ref >59)
Glucose: 84 mg/dL (ref 65–99)
Potassium: 4.5 mmol/L (ref 3.5–5.2)
Sodium: 143 mmol/L (ref 134–144)

## 2020-01-06 LAB — LIPID PANEL
Chol/HDL Ratio: 3.3 ratio (ref 0.0–4.4)
Cholesterol, Total: 175 mg/dL (ref 100–199)
HDL: 53 mg/dL (ref >39)
LDL Chol Calc (NIH): 106 mg/dL — ABNORMAL HIGH (ref 0–99)
Triglycerides: 87 mg/dL (ref 0–149)
VLDL Cholesterol Cal: 16 mg/dL (ref 5–40)

## 2020-01-07 ENCOUNTER — Other Ambulatory Visit: Payer: Self-pay

## 2020-01-07 MED ORDER — DICLOFENAC SODIUM 1 % EX GEL: 2.0000 g | Freq: Four times a day (QID) | CUTANEOUS | 1 refills | Status: DC

## 2020-01-26 ENCOUNTER — Other Ambulatory Visit: Payer: Self-pay

## 2020-01-26 ENCOUNTER — Encounter: Payer: Self-pay | Admitting: Orthopaedic Surgery

## 2020-01-26 ENCOUNTER — Ambulatory Visit (INDEPENDENT_AMBULATORY_CARE_PROVIDER_SITE_OTHER): Payer: Medicare Other | Admitting: Orthopaedic Surgery

## 2020-01-26 ENCOUNTER — Telehealth: Payer: Self-pay

## 2020-01-26 ENCOUNTER — Ambulatory Visit: Payer: Self-pay

## 2020-01-26 VITALS — Ht 60.5 in | Wt 197.0 lb

## 2020-01-26 DIAGNOSIS — M25561 Pain in right knee: Secondary | ICD-10-CM

## 2020-01-26 DIAGNOSIS — G8929 Other chronic pain: Secondary | ICD-10-CM | POA: Diagnosis not present

## 2020-01-26 DIAGNOSIS — M1712 Unilateral primary osteoarthritis, left knee: Secondary | ICD-10-CM

## 2020-01-26 DIAGNOSIS — M25562 Pain in left knee: Secondary | ICD-10-CM | POA: Diagnosis not present

## 2020-01-26 DIAGNOSIS — M1711 Unilateral primary osteoarthritis, right knee: Secondary | ICD-10-CM

## 2020-01-26 NOTE — Progress Notes (Signed)
Office Visit Note   Patient: [name]           Date of Birth: 10-26-1932           MRN: [id number] Visit Date: 01/26/2020              Requested by: Glendale Chard, [address] [place],  [place] [address] PCP: Glendale Chard, MD   Assessment & Plan: Visit Diagnoses:  1. Chronic pain of both knees   2. Arthritis of left knee   3. Arthritis of right knee     Plan: With patient's end-stage DJD left greater than right knee and failed conservative treatment of this point with multiple injections and activity modification she is want to proceed with scheduling left total knee replacement first since this one is more problematic.  At the time of surgery we will perform a right knee articular Marcaine/Depo-Medrol injection under anesthesia.  We will need preop medical, cardiac and endocrinology clearances.  Patient is open to have surgery in the near future so I told her that we cannot perform an intra-articular Marcaine/Depo-Medrol injection in the left knee today.  All questions answered.  Follow-Up Instructions: Return if symptoms worsen or fail to improve.   Orders:  Orders Placed This Encounter  Procedures  . XR Knee 1-2 Views Right  . XR Knee 1-2 Views Left   No orders of the defined types were placed in this encounter.     Procedures: No procedures performed   Clinical Data: No additional findings.   Subjective: Chief Complaint  Patient presents with  . Right Knee - Pain  . Left Knee - Pain    HPI 86 year old black female with history of end-stage DJD bilateral knees returns.  Left knee worse than right on x-ray and by symptoms.  Last couple of visits in the office Dr. Lorin Mercy has discussed definitive treatment with total knee replacements.  She is failed conservative management with multiple intra-articular Marcaine/Depo-Medrol injections and activity modification.  She is status post right total hip replacement March 2020 and left total hip  replacement June 2018 and both are doing really well.  States that she is wanting to proceed with scheduling left total knee replacement first since this one is more symptomatic.  Pain when she is up and ambulating, standing and getting up from a seated position.  Pain is having a significant impact on her quality of life. Review of Systems No current cardiac pulmonary GI GU issues  Objective: Vital Signs: Ht 5' 0.5" (1.537 m)   Wt 197 lb (89.4 kg)   BMI 37.84 kg/m   Physical Exam HENT:     Head: Normocephalic.  Pulmonary:     Effort: No respiratory distress.  Musculoskeletal:     Comments: Gait antalgic.  Negative log relatives.  Left knee range of motion about 5 to 90 degrees with discomfort.  Right knee about 0 to 105 degrees.  Left greater than right joint line tenderness.  Positive patellofemoral crepitus bilaterally.  Bilateral knee swelling with small effusions.  Bilateral calves nontender.  Neurological:     General: No focal deficit present.     Mental Status: She is alert and oriented to person, place, and time.     Ortho Exam  Specialty Comments:  No specialty comments available.  Imaging: No results found.   PMFS History: Patient Active Problem List   Diagnosis Date Noted  . History of total hip arthroplasty, right 08/21/2019  . Intermittent claudication (  [place]) 07/08/2019  . Hypertensive nephropathy 12/15/2018  . Chronic renal disease, stage II 12/15/2018  . H/O total hip arthroplasty, left 11/07/2017  . Post-traumatic osteoarthritis of left knee 11/07/2017  . Unilateral primary osteoarthritis, right knee 11/07/2017  . Presence of left artificial hip joint 05/14/2017  . Preop cardiovascular exam 03/12/2017  . Cough   . Abdominal pain   . Symptomatic cholelithiasis 07/01/2016  . Nonspecific ST-T wave electrocardiographic changes   . Lower leg edema 02/16/2015  . Obesity (BMI 30.0-34.9) 02/16/2015  . Essential hypertension 01/11/2012  . Hyperthyroidism  01/11/2012  . Hypercholesteremia 01/11/2012  . H/O vaginal hysterectomy 1974 01/11/2012    Class: History of  . S/P removal of lung  partial R lobe  2010 01/11/2012  . History of hernia repair  R ing. 1975 01/11/2012  . BACK PAIN 11/12/2007  . DYSPNEA 11/11/2007   Past Medical History:  Diagnosis Date  . Arthritis    left hip and back  . Bronchiectasis    isolated to RML; status post right middle lobe partial resection.  . Complication of anesthesia    hard to wake up  . Dyspnea   . Gall stones   . GERD (gastroesophageal reflux disease)    history   . H/O osteoporosis   . Hypertension   . Hyperthyroidism    endocrinologist - Dr. Chalmers Cater  . Pneumonia   . Yeast infection     Family History  Problem Relation Age of Onset  . Hypertension Mother   . CVA Mother 28  . Heart attack Sister   . Diabetes Sister   . Healthy Father   . Heart attack Brother   . Heart attack Brother     Past Surgical History:  Procedure Laterality Date  . ABDOMINAL HYSTERECTOMY  1974  . CHOLECYSTECTOMY N/A 07/02/2016   Procedure: LAPAROSCOPIC CHOLECYSTECTOMY;  Surgeon: Stark Klein, MD;  Location: [facility];  Service: General;  Laterality: N/A;  . COLONOSCOPY    . ENDOSCOPIC RETROGRADE CHOLANGIOPANCREATOGRAPHY (ERCP) WITH PROPOFOL N/A 08/07/2018   Procedure: ENDOSCOPIC RETROGRADE CHOLANGIOPANCREATOGRAPHY (ERCP) WITH PROPOFOL;  Surgeon: Carol Ada, MD;  Location: [facility];  Service: Endoscopy;  Laterality: N/A;  . EYE SURGERY     stye removed  . HERNIA REPAIR  1975  . KNEE SURGERY Left   . LUNG REMOVAL, PARTIAL  208   RML  . NM MYOVIEW LTD  06/2011   Normal EF. No ischemia or infarction.  [name]  08/07/2018   Procedure: [name];  Surgeon: Carol Ada, MD;  Location: [facility];  Service: Endoscopy;;  balloon sweep  . TOTAL HIP ARTHROPLASTY Left 04/05/2017   Procedure: LEFT TOTAL HIP ARTHROPLASTY ANTERIOR APPROACH;  Surgeon: [name], MD;  Location: [facility];  Service:  Orthopedics;  Laterality: Left;  . TOTAL HIP ARTHROPLASTY Right 12/19/2018  . TOTAL HIP ARTHROPLASTY Right 12/19/2018   Procedure: RIGHT TOTAL HIP ARTHROPLASTY-DIRECT ANTERIOR APPROACH;  Surgeon: [name], MD;  Location: [facility];  Service: Orthopedics;  Laterality: Right;  . TRANSTHORACIC ECHOCARDIOGRAM  06/2011   Mild concentric LVH. Normal EF with impaired relaxation. Mildly elevated RV pressures of 30 and 40 mmHg.  Marland Kitchen TRANSTHORACIC ECHOCARDIOGRAM  06/2016   normal LV size and function. EF 60-65% with GRD 2 DD. Mild aortic stenosis. Mild LA dilation. Mild to moderately increased PA pressures (46 mmHg).  . TUBAL LIGATION     Social History   Occupational History  . Occupation: retired  Tobacco Use  . Smoking status: Never Smoker  .  Smokeless tobacco: Never Used  Substance and Sexual Activity  . Alcohol use: No  . Drug use: No  . Sexual activity: Not Currently

## 2020-01-26 NOTE — Telephone Encounter (Signed)
    [facility] Pre-operative Risk Assessment    Request for surgical clearance:  1. What type of surgery is being performed?  Left Total Knee Arthroplasty  2. When is this surgery scheduled? TBD  3. What type of clearance is required (medical clearance vs. Pharmacy clearance to hold med vs. Both)? Medical  4. Are there any medications that need to be held prior to surgery and how long? None  5. Practice name and name of physician performing surgery? [facility]  Dr.Mark Yates  6. What is your office phone number [phone]   7.   What is your office fax number [phone]  8.   Anesthesia type  Choice   Kathyrn Lass 01/26/2020, 5:19 PM  _________________________________________________________________   (provider comments below)

## 2020-01-27 NOTE — Telephone Encounter (Signed)
   Primary Cardiologist: Glenetta Hew, MD  Chart reviewed as part of pre-operative protocol coverage. Given past medical history and time since last visit, based on ACC/AHA guidelines, [name] would be at acceptable risk for the planned procedure without further cardiovascular testing.   Her RCRI is a class I risk, 0.4% risk of major cardiac event.  I will route this recommendation to the requesting party via Epic fax function and remove from pre-op pool.  Please call with questions.  Jossie Ng. [facility] [address] Office [phone] Fax [phone]

## 2020-02-02 DIAGNOSIS — I10 Essential (primary) hypertension: Secondary | ICD-10-CM | POA: Diagnosis not present

## 2020-02-10 ENCOUNTER — Ambulatory Visit: Payer: Medicare Other | Admitting: Surgery

## 2020-02-11 ENCOUNTER — Ambulatory Visit (INDEPENDENT_AMBULATORY_CARE_PROVIDER_SITE_OTHER): Payer: Medicare Other | Admitting: Surgery

## 2020-02-11 ENCOUNTER — Other Ambulatory Visit: Payer: Self-pay

## 2020-02-11 ENCOUNTER — Telehealth: Payer: Self-pay | Admitting: *Deleted

## 2020-02-11 ENCOUNTER — Encounter: Payer: Self-pay | Admitting: Surgery

## 2020-02-11 VITALS — BP 133/77 | HR 75 | Temp 97.8°F | Ht 60.5 in | Wt 197.0 lb

## 2020-02-11 DIAGNOSIS — M1712 Unilateral primary osteoarthritis, left knee: Secondary | ICD-10-CM

## 2020-02-11 DIAGNOSIS — M1711 Unilateral primary osteoarthritis, right knee: Secondary | ICD-10-CM

## 2020-02-11 NOTE — Telephone Encounter (Signed)
Ortho bundle Pre-op call completed. 

## 2020-02-11 NOTE — Progress Notes (Signed)
86 year old black female history of end-stage DJD left knee and right knee comes in for preop evaluation.  She continues have ongoing left greater than right knee pain and is wanting to proceed with left total knee replacement and right knee intra-articular Marcaine/Depo-Medrol injection under anesthesia.  We have received all 3 preop clearances as ordered.  Today history physical performed.  Review of systems negative.  Surgical procedure discussed along potential hospital stay.  All questions answered.

## 2020-02-11 NOTE — Care Plan (Signed)
RNCM met with patient pre-operatively to discuss her upcoming Left total knee replacement with Dr. Lorin Mercy on 02/24/20. She is an ortho bundle patient through [facility] and is agreeable to case management. She has a daughter that will be assisting her at home after surgery. She has a FWW and requested a riser for her toilet. CM will arrange. Also anticipate HHPT will be needed after short hospital stay. Choice provided and referral made to [facility]. Anticipate beginning OPPT at 2 weeks post op. Patient requested [name] for her therapy. Will schedule this and make patient aware prior to surgery. Reviewed Cammie Mcgee. Will continue to follow for needs.

## 2020-02-18 ENCOUNTER — Ambulatory Visit (HOSPITAL_COMMUNITY)
Admission: RE | Admit: 2020-02-18 | Discharge: 2020-02-18 | Disposition: A | Discharge status: Home / Self Care | Payer: Medicare Other | Source: Ambulatory Visit | Attending: Surgery | Admitting: Surgery

## 2020-02-18 ENCOUNTER — Other Ambulatory Visit: Payer: Self-pay

## 2020-02-18 ENCOUNTER — Telehealth: Payer: Self-pay | Admitting: Orthopedic Surgery

## 2020-02-18 ENCOUNTER — Encounter (HOSPITAL_COMMUNITY)
Admission: RE | Admit: 2020-02-18 | Discharge: 2020-02-18 | Disposition: A | Discharge status: Home / Self Care | Payer: Medicare Other | Source: Ambulatory Visit | Attending: Orthopaedic Surgery | Admitting: Orthopaedic Surgery

## 2020-02-18 ENCOUNTER — Encounter (HOSPITAL_COMMUNITY): Payer: Self-pay

## 2020-02-18 DIAGNOSIS — Z01818 Encounter for other preprocedural examination: Secondary | ICD-10-CM

## 2020-02-18 DIAGNOSIS — J9811 Atelectasis: Secondary | ICD-10-CM | POA: Diagnosis not present

## 2020-02-18 HISTORY — DX: Nonrheumatic aortic (valve) stenosis: I35.0

## 2020-02-18 LAB — CBC
HCT: 38.7 % (ref 36.0–46.0)
Hemoglobin: 13.4 g/dL (ref 12.0–15.0)
MCH: 28.8 pg (ref 26.0–34.0)
MCHC: 34.6 g/dL (ref 30.0–36.0)
MCV: 83.2 fL (ref 80.0–100.0)
Platelets: 377 10*3/uL (ref 150–400)
RBC: 4.65 MIL/uL (ref 3.87–5.11)
RDW: 14.5 % (ref 11.5–15.5)
WBC: 5.4 10*3/uL (ref 4.0–10.5)
nRBC: 0 % (ref 0.0–0.2)

## 2020-02-18 LAB — URINALYSIS, ROUTINE W REFLEX MICROSCOPIC
Bilirubin Urine: NEGATIVE
Glucose, UA: NEGATIVE mg/dL
Hgb urine dipstick: NEGATIVE
Ketones, ur: NEGATIVE mg/dL
Nitrite: POSITIVE — AB
Protein, ur: NEGATIVE mg/dL
Specific Gravity, Urine: 1.017 (ref 1.005–1.030)
pH: 6 (ref 5.0–8.0)

## 2020-02-18 LAB — COMPREHENSIVE METABOLIC PANEL
ALT: 14 U/L (ref 0–44)
AST: 21 U/L (ref 15–41)
Albumin: 3.6 g/dL (ref 3.5–5.0)
Alkaline Phosphatase: 46 U/L (ref 38–126)
Anion gap: 10 (ref 5–15)
BUN: 14 mg/dL (ref 8–23)
CO2: 25 mmol/L (ref 22–32)
Calcium: 9.1 mg/dL (ref 8.9–10.3)
Chloride: 103 mmol/L (ref 98–111)
Creatinine, Ser: 0.7 mg/dL (ref 0.44–1.00)
GFR calc Af Amer: >60 mL/min (ref >60)
GFR calc non Af Amer: >60 mL/min (ref >60)
Glucose, Bld: 86 mg/dL (ref 70–99)
Potassium: 4.1 mmol/L (ref 3.5–5.1)
Sodium: 138 mmol/L (ref 135–145)
Total Bilirubin: 0.9 mg/dL (ref 0.3–1.2)
Total Protein: 8 g/dL (ref 6.5–8.1)

## 2020-02-18 LAB — SURGICAL PCR SCREEN
MRSA, PCR: NEGATIVE
Staphylococcus aureus: NEGATIVE

## 2020-02-18 MED ORDER — SULFAMETHOXAZOLE-TRIMETHOPRIM 800-160 MG PO TABS
ORAL_TABLET | ORAL | 0 refills | Status: DC
Start: 2020-02-18 — End: 2020-11-21

## 2020-02-18 NOTE — Progress Notes (Signed)
PCP - Dr. Glendale Chard Cardiologist - Dr. Ellyn Hack  PPM/ICD - N/A Device Orders -N/A  Rep Notified - N/A  Chest x-ray - 02/18/20 EKG - 02/18/20 Stress Test - 2010 ECHO - 2017 Cardiac Cath -denies   Sleep Study - denies CPAP - denies  Blood Thinner Instructions: Aspirin Instructions:  ERAS Protcol -Protocol implemented, pt instructed to have clears until 0930 DOS.  PRE-SURGERY Ensure or G2- Pt provided with (1) PRE-SURGICAL ENSURE. Instructed to complete by 0930 a.m.  COVID TEST- scheduled for Saturday, 02/20/20.   Anesthesia review: Yes, cardiac clearance 01/26/20 in Epic.   Patient denies shortness of breath, fever, cough and chest pain at PAT appointment   All instructions explained to the patient, with a verbal understanding of the material. Patient agrees to go over the instructions while at home for a better understanding. Patient also instructed to self quarantine after being tested for COVID-19. The opportunity to ask questions was provided.   Coronavirus Screening  Have you experienced the following symptoms:  Cough yes/no: No Fever (>100.47F)  yes/no: No Runny nose yes/no: No Sore throat yes/no: No Difficulty breathing/shortness of breath  yes/no: No  Have you or a family member traveled in the last 14 days and where? yes/no: No   If the patient indicates "YES" to the above questions, their PAT will be rescheduled to limit the exposure to others and, the surgeon will be notified. THE PATIENT WILL NEED TO BE ASYMPTOMATIC FOR 14 DAYS.   If the patient is not experiencing any of these symptoms, the PAT nurse will instruct them to NOT bring anyone with them to their appointment since they may have these symptoms or traveled as well.   Please remind your patients and families that hospital visitation restrictions are in effect and the importance of the restrictions.

## 2020-02-18 NOTE — Addendum Note (Signed)
Addended byLaurann Montana on: 02/18/2020 04:49 PM   Modules accepted: Orders

## 2020-02-18 NOTE — Pre-Procedure Instructions (Signed)
Your procedure is scheduled on Wednesday, Feb 24, 2020 from 12:30 PM- 2:49 PM.  Report to [facility] Main Entrance "A" at 10:30 A.M., and check in at the Admitting office.  Call this number if you have problems the morning of surgery:  [phone]  Call [phone] if you have any questions prior to your surgery date Monday-Friday 8am-4pm.    Remember:  Do not eat after midnight the night before your surgery.  You may drink clear liquids until 09:30 AM the morning of your surgery.    Clear liquids allowed are: Water, Non-Citrus Juices (without pulp), Carbonated Beverages, Clear Tea, Black Coffee Only, and Gatorade.   Enhanced Recovery after Surgery for Orthopedics Enhanced Recovery after Surgery is a protocol used to improve the stress on your body and your recovery after surgery.  Patient Instructions  . The day of surgery (if you do NOT have diabetes):  o Drink ONE (1) Pre-Surgery Clear Ensure by 09:30 AM.   o This drink was given to you during your hospital pre-op appointment visit. o Nothing else to drink after completing the  Pre-Surgery Clear Ensure.      Take these medicines the morning of surgery with A SIP OF WATER : methimazole (TAPAZOLE) metoprolol succinate (TOPROL XL) TRICOR  IF NEEDED: acetaminophen (TYLENOL 8 HOUR) busPIRone (BUSPAR)   As of today, STOP taking any Aspirin (unless otherwise instructed by your surgeon) and Aspirin containing products, diclofenac Sodium (VOLTAREN) GEL, Aleve, Naproxen, Ibuprofen, Motrin, Advil, Goody's, BC's, all herbal medications, fish oil, and all vitamins.                      Do not wear jewelry, make up, or nail polish.            Do not wear lotions, powders, perfumes, or deodorant.            Do not shave 48 hours prior to surgery.              Do not bring valuables to the hospital.            [facility] is not responsible for any belongings or valuables.  Do NOT Smoke (Tobacco/Vapping) or drink Alcohol 24  hours prior to your procedure.  If you use a CPAP at night, you may bring all equipment for your overnight stay.   Contacts, glasses, dentures or bridgework may not be worn into surgery.      For patients admitted to the hospital, discharge time will be determined by your treatment team.   Patients discharged the day of surgery will not be allowed to drive home, and someone needs to stay with them for 24 hours.    Special instructions:   [facility]- Preparing For Surgery  Before surgery, you can play an important role. Because skin is not sterile, your skin needs to be as free of germs as possible. You can reduce the number of germs on your skin by washing with CHG (chlorahexidine gluconate) Soap before surgery.  CHG is an antiseptic cleaner which kills germs and bonds with the skin to continue killing germs even after washing.    Oral Hygiene is also important to reduce your risk of infection.  Remember - BRUSH YOUR TEETH THE MORNING OF SURGERY WITH YOUR REGULAR TOOTHPASTE  Please do not use if you have an allergy to CHG or antibacterial soaps. If your skin becomes reddened/irritated stop using the CHG.  Do not shave (including legs and  underarms) for at least 48 hours prior to first CHG shower. It is OK to shave your face.  Please follow these instructions carefully.   1. Shower the NIGHT BEFORE SURGERY and the MORNING OF SURGERY with CHG Soap.   2. If you chose to wash your hair, wash your hair first as usual with your normal shampoo.  3. After you shampoo, rinse your hair and body thoroughly to remove the shampoo.  4. Use CHG as you would any other liquid soap. You can apply CHG directly to the skin and wash gently with a scrungie or a clean washcloth.   5. Apply the CHG Soap to your body ONLY FROM THE NECK DOWN.  Do not use on open wounds or open sores. Avoid contact with your eyes, ears, mouth and genitals (private parts). Wash Face and genitals (private parts)  with your  normal soap.   6. Wash thoroughly, paying special attention to the area where your surgery will be performed.  7. Thoroughly rinse your body with warm water from the neck down.  8. DO NOT shower/wash with your normal soap after using and rinsing off the CHG Soap.  9. Pat yourself dry with a CLEAN TOWEL.  10. Wear CLEAN PAJAMAS to bed the night before surgery, wear comfortable clothes the morning of surgery  11. Place CLEAN SHEETS on your bed the night of your first shower and DO NOT SLEEP WITH PETS.   Day of Surgery:   Do not apply any deodorants/lotions.  Please wear clean clothes to the hospital/surgery center.   Remember to brush your teeth WITH YOUR REGULAR TOOTHPASTE.   Please read over the following fact sheets that you were given.

## 2020-02-18 NOTE — Telephone Encounter (Signed)
[name] is scheduled for a left total knee arthroplasty next week.  Please review her abnormal pre-op UA that is positive for UTI and advise patient on what to do to treat.

## 2020-02-18 NOTE — Progress Notes (Signed)
Malachy Mood, surgical scheduler for Dr. Lorin Mercy notified of pt's positive UA. Malachy Mood will notify Dr. Lorin Mercy.  Jacqlyn Larsen, RN

## 2020-02-18 NOTE — Telephone Encounter (Signed)
Rx submitted. Tried calling patient. No answer. LMVM advising per Dr Lorin Mercy

## 2020-02-18 NOTE — Telephone Encounter (Signed)
Please send in septra DS one po bid times 5 days. Let her know looks like may be a bladder infection. Still on for surgery.

## 2020-02-19 ENCOUNTER — Encounter (HOSPITAL_COMMUNITY): Payer: Self-pay

## 2020-02-19 ENCOUNTER — Other Ambulatory Visit (HOSPITAL_COMMUNITY): Payer: Medicare Other

## 2020-02-19 NOTE — Anesthesia Preprocedure Evaluation (Addendum)
Anesthesia Evaluation  Patient identified by MRN, date of birth, ID band Patient awake    Reviewed: Allergy & Precautions, NPO status , Patient's Chart, lab work & pertinent test results, reviewed documented beta blocker date and time   History of Anesthesia Complications Negative for: history of anesthetic complications  Airway Mallampati: II  TM Distance: >3 FB Neck ROM: Full    Dental  (+) Edentulous Upper, Edentulous Lower, Dental Advisory Given   Pulmonary shortness of breath,  02/20/2020 SARS coronavirus NEG   breath sounds clear to auscultation       Cardiovascular hypertension, Pt. on medications and Pt. on home beta blockers (-) anginaNormal cardiovascular exam+ Valvular Problems/Murmurs AS  Rhythm:Regular  '17 stress: No significant ischemia demonstrated.  Post EF 83%.  This is a low risk scan. '17 ECHO: EF 60-65%, mild AS   Neuro/Psych negative neurological ROS     GI/Hepatic Neg liver ROS, GERD  Controlled,  Endo/Other  Morbid obesity  Renal/GU Renal InsufficiencyRenal disease     Musculoskeletal  (+) Arthritis , Osteoarthritis,    Abdominal (+) + obese,   Peds  Hematology negative hematology ROS (+)   Anesthesia Other Findings   Reproductive/Obstetrics                         Anesthesia Physical Anesthesia Plan  ASA: III  Anesthesia Plan: Spinal   Post-op Pain Management:  Regional for Post-op pain   Induction:   PONV Risk Score and Plan: 2 and Ondansetron and Dexamethasone  Airway Management Planned: Natural Airway and Mask  Additional Equipment: None  Intra-op Plan:   Post-operative Plan:   Informed Consent: I have reviewed the patients History and Physical, chart, labs and discussed the procedure including the risks, benefits and alternatives for the proposed anesthesia with the patient or authorized representative who has indicated his/her understanding and  acceptance.     Dental advisory given  Plan Discussed with: CRNA, Anesthesiologist and Surgeon  Anesthesia Plan Comments: (PAT note written 02/19/2020 by Myra Gianotti, PA-C. Plan routine monitors, SAB with adductor canal block for post op analgesia)      Anesthesia Quick Evaluation

## 2020-02-19 NOTE — Progress Notes (Signed)
Anesthesia Chart Review:  Case: [id number] Date/Time: 02/24/20 1215   Procedures:      LEFT TOTAL KNEE ARTHROPLASTY (Left Knee)     RIGHT KNEE STEROID INTRA-ARTICULAR MARCAINE/DEPO-MEDROL INJECTION UNDER ANESTHESIA (Right Knee)   Anesthesia type: Choice   Pre-op diagnosis: left knee degerative joint disease, right knee degenerative joint disease   Location: [facility] OR ROOM 03 / [place] [facility]   Surgeons: [name], MD      DISCUSSION: Patient is a 86 year old female scheduled for the above procedure.  History includes never smoker, bronchiectasis (isolated to RML, s/p RM lobectomy 11/28/06), dyspnea, HTN, GERD, hyperthyroidism (on Tapazole, beta blocker). Very mild AS by 06/2016 echo. Reported "hard to wake up" after anesthesia. S/p left THA 04/05/17, right THA 12/19/18.  BMI is consistent with obesity.  - Cardiology preoperative input outlined by Coletta Memos, NP on 01/27/20: "...based on ACC/AHA guidelines, [name] would be at acceptable risk for the planned procedure without further cardiovascular testing.  Her RCRI is a class I risk, 0.4% risk of major cardiac event."  - Endocrinologist Dr. Chalmers Cater signed a noted of endocrinology clearance on 02/03/20, stating "pt is biochemically euthyroid".  - Dr. Baird Cancer signed a note of medical clearance dated 01/26/20.   Dr. Lorin Mercy prescribed Septa DS x 5 days due to abnormal UA concerning for UTI.  S/p 2nd COVID-19 vaccine on 12/30/19. Preoperative COVID-19 test is scheduled for 02/20/2020.  Anesthesia team to evaluate on the day of surgery.   VS: BP 109/70   Pulse (!) 56   Temp 36.8 C (Oral)   Resp 17   Wt 91.9 kg   SpO2 98%   BMI 38.92 kg/m     PROVIDERS: Glendale Chard, MD his PCP Jacelyn Pi, MD is endocrinologist Glenetta Hew, MD is cardiologist. Last evaluation 11/18/19 with Jory Sims, NP.  Last pulmonology evaluation was on 01/03/16 with Marshell Garfinkel, MD. she was not having any new respiratory symptoms.  Previous work-up did  not show significant lung or cardiac impairments.   LABS: Labs reviewed: Acceptable for surgery. UA suggestive of UTI, surgeon aware.  (all labs ordered are listed, but only abnormal results are displayed)  Labs Reviewed  URINALYSIS, ROUTINE W REFLEX MICROSCOPIC - Abnormal; Notable for the following components:      Result Value   APPearance HAZY (*)    Nitrite POSITIVE (*)    Leukocytes,Ua SMALL (*)    Bacteria, UA MANY (*)    All other components within normal limits  SURGICAL PCR SCREEN  CBC  COMPREHENSIVE METABOLIC PANEL    PFTs [id number] (as outlined in pulmonology notes): FVC 2.21 [89%) FEV1 1.54 (90%) F/F 69 TLC 97% DLCO 60%. Very mild airflow obstruction, no response to bronchodilator, restriction, mild reduction in diffusion capacity.   IMAGES: CXR 02/18/20: FINDINGS: Heart size within normal limits. Unchanged elevation of the right hemidiaphragm with postoperative changes to the perihilar right lung. Minimal atelectasis and/or scarring within the right mid lung. Lungs are otherwise clear. No evidence of pleural effusion or pneumothorax. No acute bony abnormality. Surgical clips within the upper abdomen. IMPRESSION: - Minimal linear atelectasis and/or scarring within the right mid lung. - Otherwise stable examination as compared to 07/03/2016. Unchanged elevation of the right hemidiaphragm with postoperative changes to the perihilar right lung.   EKG: 02/18/20: Sinus bradycardia at 57 bpm  Otherwise normal ECG Confirmed by Croitoru, Mihai [phone]) on 02/18/2020 5:01:17 PM   CV: Echo 07/05/16: Study Conclusions  - Left ventricle: The cavity size was normal.  Systolic function was  normal. The estimated ejection fraction was in the range of 60%  to 65%. Wall motion was normal; there were no regional wall  motion abnormalities. Features are consistent with a pseudonormal  left ventricular filling pattern, with concomitant abnormal  relaxation and  increased filling pressure (grade 2 diastolic  dysfunction).  - Aortic valve: There was very mild stenosis.  VTI ratio of LVOT to aortic valve: 0.7. Valve area (VTI): 2.01 cm^2. Indexed valve  area (VTI): 0.98 cm^2/m^2. Peak velocity ratio of LVOT to aortic  valve: 0.73. Valve area (Vmax): 2.09 cm^2. Indexed valve area  (Vmax): 1.01 cm^2/m^2. Mean velocity ratio of LVOT to aortic valve:  0.68. Valve area (Vmean): 1.96 cm^2. Indexed valve area (Vmean):  0.95 cm^2/m^2.  Mean gradient (S): 10 mm Hg. Peak gradient (S):  19 mm Hg.  - Mitral valve: Calcified annulus.  - Left atrium: The atrium was mildly dilated.  - Pulmonary arteries: Systolic pressure was mildly to moderately  increased. PA peak pressure: 46 mm Hg (S).    Nuclear stress test 12/22/08: Normal myocardial perfusion study demonstrating an attenuation artifact in the anterior region of the myocardium.  No ischemia or infarct/scar is seen in the remaining myocardium..  No significant ischemia demonstrated.  Post EF 83%.  This is a low risk scan.   Past Medical History:  Diagnosis Date  . Aortic stenosis    Echo 07/05/16: Very mild AS, Mean gradient 10 mm Hg, Peak gradient (S) 19 mmHg   . Arthritis    left hip and back  . Bronchiectasis    isolated to RML; status post right middle lobe partial resection.  . Complication of anesthesia    hard to wake up  . Dyspnea   . Gall stones   . GERD (gastroesophageal reflux disease)    history   . H/O osteoporosis   . Hypertension   . Hyperthyroidism    endocrinologist - Dr. Chalmers Cater  . Pneumonia   . Yeast infection     Past Surgical History:  Procedure Laterality Date  . ABDOMINAL HYSTERECTOMY  1974  . CHOLECYSTECTOMY N/A 07/02/2016   Procedure: LAPAROSCOPIC CHOLECYSTECTOMY;  Surgeon: Stark Klein, MD;  Location: [place];  Service: General;  Laterality: N/A;  . COLONOSCOPY    . ENDOSCOPIC RETROGRADE CHOLANGIOPANCREATOGRAPHY (ERCP) WITH PROPOFOL N/A 08/07/2018   Procedure:  ENDOSCOPIC RETROGRADE CHOLANGIOPANCREATOGRAPHY (ERCP) WITH PROPOFOL;  Surgeon: Carol Ada, MD;  Location: [facility];  Service: Endoscopy;  Laterality: N/A;  . EYE SURGERY     stye removed  . HERNIA REPAIR  1975  . KNEE SURGERY Left   . LUNG REMOVAL, PARTIAL  208   RML  . NM MYOVIEW LTD  06/2011   Normal EF. No ischemia or infarction.  [name]  08/07/2018   Procedure: [name];  Surgeon: Carol Ada, MD;  Location: [facility];  Service: Endoscopy;;  balloon sweep  . TOTAL HIP ARTHROPLASTY Left 04/05/2017   Procedure: LEFT TOTAL HIP ARTHROPLASTY ANTERIOR APPROACH;  Surgeon: [name], MD;  Location: [place];  Service: Orthopedics;  Laterality: Left;  . TOTAL HIP ARTHROPLASTY Right 12/19/2018  . TOTAL HIP ARTHROPLASTY Right 12/19/2018   Procedure: RIGHT TOTAL HIP ARTHROPLASTY-DIRECT ANTERIOR APPROACH;  Surgeon: [name], MD;  Location: [place];  Service: Orthopedics;  Laterality: Right;  . TRANSTHORACIC ECHOCARDIOGRAM  06/2011   Mild concentric LVH. Normal EF with impaired relaxation. Mildly elevated RV pressures of 30 and 40 mmHg.  Marland Kitchen TRANSTHORACIC ECHOCARDIOGRAM  06/2016  normal LV size and function. EF 60-65% with GRD 2 DD. Mild aortic stenosis. Mild LA dilation. Mild to moderately increased PA pressures (46 mmHg).  . TUBAL LIGATION      MEDICATIONS: . acetaminophen (TYLENOL 8 HOUR) 650 MG CR tablet  . Ascorbic Acid 500 MG CHEW  . busPIRone (BUSPAR) 5 MG tablet  . calcium carbonate (CALCIUM 600) 600 MG TABS tablet  . cholecalciferol (VITAMIN D3) 25 MCG (1000 UT) tablet  . diclofenac Sodium (VOLTAREN) 1 % GEL  . Flaxseed, Linseed, (FLAXSEED OIL) 1000 MG CAPS  . furosemide (LASIX) 20 MG tablet  . methimazole (TAPAZOLE) 5 MG tablet  . metoprolol succinate (TOPROL XL) 25 MG 24 hr tablet  . nystatin cream (MYCOSTATIN)  . potassium chloride SA (KLOR-CON) 20 MEQ tablet  . sulfamethoxazole-trimethoprim (BACTRIM DS) 800-160 MG tablet  . TRICOR 145 MG tablet    No current facility-administered medications for this encounter.     Myra Gianotti, PA-C Surgical Short Stay/Anesthesiology [facility] Phone [phone] [facility] Phone [phone] 02/19/2020 4:55 PM

## 2020-02-20 ENCOUNTER — Other Ambulatory Visit (HOSPITAL_COMMUNITY)
Admission: RE | Admit: 2020-02-20 | Discharge: 2020-02-20 | Disposition: A | Discharge status: Home / Self Care | Payer: Medicare Other | Source: Ambulatory Visit | Attending: Orthopaedic Surgery | Admitting: Orthopaedic Surgery

## 2020-02-20 DIAGNOSIS — Z20822 Contact with and (suspected) exposure to covid-19: Secondary | ICD-10-CM | POA: Insufficient documentation

## 2020-02-20 DIAGNOSIS — Z01812 Encounter for preprocedural laboratory examination: Secondary | ICD-10-CM | POA: Insufficient documentation

## 2020-02-20 LAB — SARS CORONAVIRUS 2 (TAT 6-24 HRS): SARS Coronavirus 2: NEGATIVE

## 2020-02-23 ENCOUNTER — Other Ambulatory Visit: Payer: Self-pay | Admitting: *Deleted

## 2020-02-23 ENCOUNTER — Telehealth: Payer: Self-pay | Admitting: *Deleted

## 2020-02-23 DIAGNOSIS — M1732 Unilateral post-traumatic osteoarthritis, left knee: Secondary | ICD-10-CM

## 2020-02-23 MED ORDER — BUPIVACAINE LIPOSOME 1.3 % IJ SUSP
20.0000 mL | Freq: Once | INTRAMUSCULAR | Status: DC
  Filled 2020-02-23: qty 20

## 2020-02-23 NOTE — Telephone Encounter (Signed)
Ortho bundle pre-op call completed- Updated patient that OPPT has been scheduled for [facility] location on 03/10/20 at 1:00 pm.

## 2020-02-24 ENCOUNTER — Ambulatory Visit (HOSPITAL_COMMUNITY): Payer: Medicare Other | Admitting: Anesthesiology

## 2020-02-24 ENCOUNTER — Inpatient Hospital Stay (HOSPITAL_COMMUNITY)
Admission: RE | Admit: 2020-02-24 | Discharge: 2020-02-26 | DRG: 470 | Disposition: A | Discharge status: Home Health | Payer: Medicare Other | Attending: Orthopaedic Surgery | Admitting: Orthopaedic Surgery

## 2020-02-24 ENCOUNTER — Other Ambulatory Visit: Payer: Self-pay

## 2020-02-24 ENCOUNTER — Ambulatory Visit (HOSPITAL_COMMUNITY): Payer: Medicare Other | Admitting: Vascular Surgery

## 2020-02-24 ENCOUNTER — Encounter (HOSPITAL_COMMUNITY)
Admission: RE | Disposition: A | Discharge status: Home Health | Payer: Self-pay | Source: Home / Self Care | Attending: Orthopaedic Surgery

## 2020-02-24 ENCOUNTER — Inpatient Hospital Stay (HOSPITAL_COMMUNITY): Payer: Medicare Other

## 2020-02-24 ENCOUNTER — Encounter (HOSPITAL_COMMUNITY): Payer: Self-pay | Admitting: Orthopaedic Surgery

## 2020-02-24 DIAGNOSIS — I129 Hypertensive chronic kidney disease with stage 1 through stage 4 chronic kidney disease, or unspecified chronic kidney disease: Secondary | ICD-10-CM | POA: Diagnosis present

## 2020-02-24 DIAGNOSIS — M1732 Unilateral post-traumatic osteoarthritis, left knee: Principal | ICD-10-CM | POA: Diagnosis present

## 2020-02-24 DIAGNOSIS — K219 Gastro-esophageal reflux disease without esophagitis: Secondary | ICD-10-CM | POA: Diagnosis present

## 2020-02-24 DIAGNOSIS — Z8249 Family history of ischemic heart disease and other diseases of the circulatory system: Secondary | ICD-10-CM

## 2020-02-24 DIAGNOSIS — S82143S Displaced bicondylar fracture of unspecified tibia, sequela: Secondary | ICD-10-CM | POA: Diagnosis not present

## 2020-02-24 DIAGNOSIS — Z6838 Body mass index (BMI) 38.0-38.9, adult: Secondary | ICD-10-CM

## 2020-02-24 DIAGNOSIS — M1711 Unilateral primary osteoarthritis, right knee: Secondary | ICD-10-CM | POA: Diagnosis present

## 2020-02-24 DIAGNOSIS — N182 Chronic kidney disease, stage 2 (mild): Secondary | ICD-10-CM | POA: Diagnosis not present

## 2020-02-24 DIAGNOSIS — Z96652 Presence of left artificial knee joint: Secondary | ICD-10-CM | POA: Diagnosis not present

## 2020-02-24 DIAGNOSIS — E0590 Thyrotoxicosis, unspecified without thyrotoxic crisis or storm: Secondary | ICD-10-CM | POA: Diagnosis present

## 2020-02-24 DIAGNOSIS — F419 Anxiety disorder, unspecified: Secondary | ICD-10-CM | POA: Diagnosis present

## 2020-02-24 DIAGNOSIS — Z886 Allergy status to analgesic agent status: Secondary | ICD-10-CM

## 2020-02-24 DIAGNOSIS — Z471 Aftercare following joint replacement surgery: Secondary | ICD-10-CM | POA: Diagnosis not present

## 2020-02-24 DIAGNOSIS — Z823 Family history of stroke: Secondary | ICD-10-CM | POA: Diagnosis not present

## 2020-02-24 DIAGNOSIS — G8918 Other acute postprocedural pain: Secondary | ICD-10-CM | POA: Diagnosis not present

## 2020-02-24 DIAGNOSIS — Z20822 Contact with and (suspected) exposure to covid-19: Secondary | ICD-10-CM | POA: Diagnosis not present

## 2020-02-24 DIAGNOSIS — B379 Candidiasis, unspecified: Secondary | ICD-10-CM | POA: Diagnosis present

## 2020-02-24 DIAGNOSIS — Z888 Allergy status to other drugs, medicaments and biological substances status: Secondary | ICD-10-CM | POA: Diagnosis not present

## 2020-02-24 DIAGNOSIS — M170 Bilateral primary osteoarthritis of knee: Secondary | ICD-10-CM | POA: Diagnosis not present

## 2020-02-24 DIAGNOSIS — Z96643 Presence of artificial hip joint, bilateral: Secondary | ICD-10-CM | POA: Diagnosis present

## 2020-02-24 DIAGNOSIS — M810 Age-related osteoporosis without current pathological fracture: Secondary | ICD-10-CM | POA: Diagnosis present

## 2020-02-24 DIAGNOSIS — M1712 Unilateral primary osteoarthritis, left knee: Secondary | ICD-10-CM | POA: Diagnosis not present

## 2020-02-24 DIAGNOSIS — Z09 Encounter for follow-up examination after completed treatment for conditions other than malignant neoplasm: Secondary | ICD-10-CM

## 2020-02-24 DIAGNOSIS — G629 Polyneuropathy, unspecified: Secondary | ICD-10-CM | POA: Diagnosis present

## 2020-02-24 DIAGNOSIS — M19042 Primary osteoarthritis, left hand: Secondary | ICD-10-CM | POA: Diagnosis not present

## 2020-02-24 DIAGNOSIS — Z833 Family history of diabetes mellitus: Secondary | ICD-10-CM

## 2020-02-24 DIAGNOSIS — I350 Nonrheumatic aortic (valve) stenosis: Secondary | ICD-10-CM | POA: Diagnosis not present

## 2020-02-24 HISTORY — PX: HARDWARE REMOVAL: SHX979

## 2020-02-24 HISTORY — PX: STERIOD INJECTION: SHX5046

## 2020-02-24 HISTORY — PX: TOTAL KNEE ARTHROPLASTY: SHX125

## 2020-02-24 SURGERY — ARTHROPLASTY, KNEE, TOTAL
Anesthesia: Spinal | Site: Knee | Laterality: Right

## 2020-02-24 MED ORDER — FENTANYL CITRATE (PF) 100 MCG/2ML IJ SOLN: 50.0000 ug | Freq: Once | INTRAMUSCULAR | Status: AC

## 2020-02-24 MED ORDER — ONDANSETRON HCL 4 MG/2ML IJ SOLN
INTRAMUSCULAR | Status: DC | PRN
  Administered 2020-02-24: 4 mg via INTRAVENOUS

## 2020-02-24 MED ORDER — FENTANYL CITRATE (PF) 250 MCG/5ML IJ SOLN
INTRAMUSCULAR | Status: AC
  Filled 2020-02-24: qty 5

## 2020-02-24 MED ORDER — VITAMIN D 25 MCG (1000 UNIT) PO TABS
1000.0000 [IU] | ORAL_TABLET | Freq: Every day | ORAL | Status: DC
  Administered 2020-02-24 – 2020-02-26 (×3): 1000 [IU] via ORAL
  Filled 2020-02-24 (×3): qty 1

## 2020-02-24 MED ORDER — TRANEXAMIC ACID 1000 MG/10ML IV SOLN
2000.0000 mg | Freq: Once | INTRAVENOUS | Status: DC
  Filled 2020-02-24: qty 20

## 2020-02-24 MED ORDER — MIDAZOLAM HCL 2 MG/2ML IJ SOLN: 0.5000 mg | Freq: Once | INTRAMUSCULAR | Status: DC | PRN

## 2020-02-24 MED ORDER — DOCUSATE SODIUM 100 MG PO CAPS
100.0000 mg | ORAL_CAPSULE | Freq: Two times a day (BID) | ORAL | Status: DC
  Administered 2020-02-24 – 2020-02-26 (×4): 100 mg via ORAL
  Filled 2020-02-24 (×4): qty 1

## 2020-02-24 MED ORDER — SODIUM CHLORIDE 0.9 % IV SOLN: INTRAVENOUS | Status: DC

## 2020-02-24 MED ORDER — PROPOFOL 500 MG/50ML IV EMUL
INTRAVENOUS | Status: DC | PRN
  Administered 2020-02-24: 50 ug/kg/min via INTRAVENOUS

## 2020-02-24 MED ORDER — BUPIVACAINE HCL (PF) 0.5 % IJ SOLN
INTRAMUSCULAR | Status: AC
  Filled 2020-02-24: qty 10

## 2020-02-24 MED ORDER — FENOFIBRATE 54 MG PO TABS
54.0000 mg | ORAL_TABLET | Freq: Every day | ORAL | Status: DC
  Administered 2020-02-25 – 2020-02-26 (×2): 54 mg via ORAL
  Filled 2020-02-24 (×2): qty 1

## 2020-02-24 MED ORDER — LIDOCAINE 2% (20 MG/ML) 5 ML SYRINGE
INTRAMUSCULAR | Status: AC
  Filled 2020-02-24: qty 5

## 2020-02-24 MED ORDER — MENTHOL 3 MG MT LOZG: 1.0000 | LOZENGE | OROMUCOSAL | Status: DC | PRN

## 2020-02-24 MED ORDER — MIDAZOLAM HCL 2 MG/2ML IJ SOLN
INTRAMUSCULAR | Status: AC
  Filled 2020-02-24: qty 2

## 2020-02-24 MED ORDER — PROPOFOL 10 MG/ML IV BOLUS
INTRAVENOUS | Status: DC | PRN
  Administered 2020-02-24: 15 mg via INTRAVENOUS
  Administered 2020-02-24: 20 mg via INTRAVENOUS

## 2020-02-24 MED ORDER — PROPOFOL 10 MG/ML IV BOLUS
INTRAVENOUS | Status: AC
  Filled 2020-02-24: qty 20

## 2020-02-24 MED ORDER — BUSPIRONE HCL 5 MG PO TABS
5.0000 mg | ORAL_TABLET | Freq: Every day | ORAL | Status: DC | PRN
  Administered 2020-02-24: 5 mg via ORAL
  Filled 2020-02-24: qty 1

## 2020-02-24 MED ORDER — HYDROMORPHONE HCL 1 MG/ML IJ SOLN
INTRAMUSCULAR | Status: AC
  Filled 2020-02-24: qty 1

## 2020-02-24 MED ORDER — PHENYLEPHRINE HCL-NACL 10-0.9 MG/250ML-% IV SOLN
INTRAVENOUS | Status: DC | PRN
  Administered 2020-02-24: 50 ug/min via INTRAVENOUS

## 2020-02-24 MED ORDER — CEFAZOLIN SODIUM-DEXTROSE 2-4 GM/100ML-% IV SOLN
2.0000 g | INTRAVENOUS | Status: AC
  Administered 2020-02-24: 2 g via INTRAVENOUS
  Filled 2020-02-24: qty 100

## 2020-02-24 MED ORDER — HYDROCODONE-ACETAMINOPHEN 7.5-325 MG PO TABS
1.0000 | ORAL_TABLET | ORAL | Status: DC | PRN
  Administered 2020-02-24 – 2020-02-26 (×7): 1 via ORAL
  Filled 2020-02-24 (×7): qty 1

## 2020-02-24 MED ORDER — FUROSEMIDE 20 MG PO TABS
20.0000 mg | ORAL_TABLET | Freq: Every day | ORAL | Status: DC
  Administered 2020-02-26: 20 mg via ORAL
  Filled 2020-02-24: qty 1

## 2020-02-24 MED ORDER — PHENYLEPHRINE 40 MCG/ML (10ML) SYRINGE FOR IV PUSH (FOR BLOOD PRESSURE SUPPORT)
PREFILLED_SYRINGE | INTRAVENOUS | Status: AC
  Filled 2020-02-24: qty 10

## 2020-02-24 MED ORDER — TRANEXAMIC ACID-NACL 1000-0.7 MG/100ML-% IV SOLN
INTRAVENOUS | Status: AC
  Filled 2020-02-24: qty 100

## 2020-02-24 MED ORDER — HYDROMORPHONE HCL 1 MG/ML IJ SOLN
0.2500 mg | INTRAMUSCULAR | Status: DC | PRN
  Administered 2020-02-24: 0.5 mg via INTRAVENOUS

## 2020-02-24 MED ORDER — TRANEXAMIC ACID 1000 MG/10ML IV SOLN: INTRAVENOUS | Status: DC | PRN

## 2020-02-24 MED ORDER — ONDANSETRON HCL 4 MG/2ML IJ SOLN
INTRAMUSCULAR | Status: AC
  Filled 2020-02-24: qty 2

## 2020-02-24 MED ORDER — DEXAMETHASONE SODIUM PHOSPHATE 10 MG/ML IJ SOLN
INTRAMUSCULAR | Status: AC
  Filled 2020-02-24: qty 1

## 2020-02-24 MED ORDER — BUPIVACAINE HCL (PF) 0.25 % IJ SOLN
INTRAMUSCULAR | Status: AC
  Filled 2020-02-24: qty 30

## 2020-02-24 MED ORDER — TRANEXAMIC ACID-NACL 1000-0.7 MG/100ML-% IV SOLN
INTRAVENOUS | Status: DC | PRN
  Administered 2020-02-24: 1000 mg via INTRAVENOUS

## 2020-02-24 MED ORDER — FENTANYL CITRATE (PF) 100 MCG/2ML IJ SOLN
INTRAMUSCULAR | Status: AC
  Administered 2020-02-24: 50 ug via INTRAVENOUS
  Filled 2020-02-24: qty 2

## 2020-02-24 MED ORDER — METHOCARBAMOL 1000 MG/10ML IJ SOLN
500.0000 mg | Freq: Four times a day (QID) | INTRAVENOUS | Status: DC | PRN
  Filled 2020-02-24: qty 5

## 2020-02-24 MED ORDER — CALCIUM CARBONATE 1250 (500 CA) MG PO TABS
1.0000 | ORAL_TABLET | Freq: Every day | ORAL | Status: DC
  Administered 2020-02-25 – 2020-02-26 (×2): 500 mg via ORAL
  Filled 2020-02-24 (×2): qty 1

## 2020-02-24 MED ORDER — ROPIVACAINE HCL 7.5 MG/ML IJ SOLN
INTRAMUSCULAR | Status: DC | PRN
  Administered 2020-02-24: 20 mL via PERINEURAL

## 2020-02-24 MED ORDER — MEPERIDINE HCL 25 MG/ML IJ SOLN: 6.2500 mg | INTRAMUSCULAR | Status: DC | PRN

## 2020-02-24 MED ORDER — MIDAZOLAM HCL 2 MG/2ML IJ SOLN
INTRAMUSCULAR | Status: AC
  Administered 2020-02-24: 0.5 mg via INTRAVENOUS
  Filled 2020-02-24: qty 2

## 2020-02-24 MED ORDER — BUPIVACAINE IN DEXTROSE 0.75-8.25 % IT SOLN
INTRATHECAL | Status: DC | PRN
  Administered 2020-02-24: 12 mg via INTRATHECAL

## 2020-02-24 MED ORDER — DEXAMETHASONE SODIUM PHOSPHATE 10 MG/ML IJ SOLN
INTRAMUSCULAR | Status: DC | PRN
  Administered 2020-02-24: 10 mg via INTRAVENOUS

## 2020-02-24 MED ORDER — ONDANSETRON HCL 4 MG PO TABS
4.0000 mg | ORAL_TABLET | Freq: Four times a day (QID) | ORAL | Status: DC | PRN
  Administered 2020-02-25: 4 mg via ORAL
  Filled 2020-02-24: qty 1

## 2020-02-24 MED ORDER — ONDANSETRON HCL 4 MG/2ML IJ SOLN: 4.0000 mg | Freq: Four times a day (QID) | INTRAMUSCULAR | Status: DC | PRN

## 2020-02-24 MED ORDER — MORPHINE SULFATE (PF) 2 MG/ML IV SOLN
0.5000 mg | INTRAVENOUS | Status: DC | PRN
  Administered 2020-02-25 – 2020-02-26 (×5): 0.5 mg via INTRAVENOUS
  Filled 2020-02-24 (×5): qty 1

## 2020-02-24 MED ORDER — ACETAMINOPHEN 325 MG PO TABS
325.0000 mg | ORAL_TABLET | Freq: Four times a day (QID) | ORAL | Status: DC | PRN
  Administered 2020-02-25: 325 mg via ORAL
  Filled 2020-02-24: qty 2

## 2020-02-24 MED ORDER — BUPIVACAINE LIPOSOME 1.3 % IJ SUSP
INTRAMUSCULAR | Status: DC | PRN
  Administered 2020-02-24: 20 mL

## 2020-02-24 MED ORDER — SODIUM CHLORIDE 0.9 % IV BOLUS
300.0000 mL | Freq: Once | INTRAVENOUS | Status: AC
  Administered 2020-02-24: 300 mL via INTRAVENOUS

## 2020-02-24 MED ORDER — PROMETHAZINE HCL 25 MG/ML IJ SOLN: 6.2500 mg | INTRAMUSCULAR | Status: DC | PRN

## 2020-02-24 MED ORDER — SODIUM CHLORIDE 0.9 % IR SOLN
Status: DC | PRN
  Administered 2020-02-24: 3000 mL

## 2020-02-24 MED ORDER — ASPIRIN EC 325 MG PO TBEC
325.0000 mg | DELAYED_RELEASE_TABLET | Freq: Every day | ORAL | Status: DC
  Administered 2020-02-25 – 2020-02-26 (×2): 325 mg via ORAL
  Filled 2020-02-24 (×2): qty 1

## 2020-02-24 MED ORDER — METOCLOPRAMIDE HCL 5 MG/ML IJ SOLN: 5.0000 mg | Freq: Three times a day (TID) | INTRAMUSCULAR | Status: DC | PRN

## 2020-02-24 MED ORDER — METOPROLOL SUCCINATE ER 25 MG PO TB24
25.0000 mg | ORAL_TABLET | Freq: Every day | ORAL | Status: DC
  Administered 2020-02-26: 25 mg via ORAL
  Filled 2020-02-24: qty 1

## 2020-02-24 MED ORDER — POTASSIUM CHLORIDE CRYS ER 20 MEQ PO TBCR
20.0000 meq | EXTENDED_RELEASE_TABLET | Freq: Every day | ORAL | Status: DC
  Administered 2020-02-24 – 2020-02-26 (×2): 20 meq via ORAL
  Filled 2020-02-24 (×3): qty 1

## 2020-02-24 MED ORDER — MIDAZOLAM HCL 2 MG/2ML IJ SOLN: 0.5000 mg | Freq: Once | INTRAMUSCULAR | Status: AC

## 2020-02-24 MED ORDER — METHYLPREDNISOLONE ACETATE 40 MG/ML IJ SUSP
INTRAMUSCULAR | Status: DC | PRN
  Administered 2020-02-24: 10 mL via INTRA_ARTICULAR

## 2020-02-24 MED ORDER — METHYLPREDNISOLONE ACETATE 40 MG/ML IJ SUSP
INTRAMUSCULAR | Status: AC
  Filled 2020-02-24: qty 1

## 2020-02-24 MED ORDER — PHENOL 1.4 % MT LIQD: 1.0000 | OROMUCOSAL | Status: DC | PRN

## 2020-02-24 MED ORDER — BUPIVACAINE HCL (PF) 0.25 % IJ SOLN
INTRAMUSCULAR | Status: DC | PRN
  Administered 2020-02-24: 20 mL via INTRA_ARTICULAR

## 2020-02-24 MED ORDER — METHIMAZOLE 5 MG PO TABS
2.5000 mg | ORAL_TABLET | Freq: Every day | ORAL | Status: DC
  Administered 2020-02-25 – 2020-02-26 (×2): 2.5 mg via ORAL
  Filled 2020-02-24 (×2): qty 1

## 2020-02-24 MED ORDER — PHENYLEPHRINE 40 MCG/ML (10ML) SYRINGE FOR IV PUSH (FOR BLOOD PRESSURE SUPPORT)
PREFILLED_SYRINGE | INTRAVENOUS | Status: DC | PRN
  Administered 2020-02-24: 160 ug via INTRAVENOUS
  Administered 2020-02-24: 120 ug via INTRAVENOUS
  Administered 2020-02-24: 80 ug via INTRAVENOUS

## 2020-02-24 MED ORDER — METHOCARBAMOL 500 MG PO TABS
500.0000 mg | ORAL_TABLET | Freq: Four times a day (QID) | ORAL | Status: DC | PRN
  Administered 2020-02-24 – 2020-02-25 (×5): 500 mg via ORAL
  Filled 2020-02-24 (×5): qty 1

## 2020-02-24 MED ORDER — LACTATED RINGERS IV SOLN: INTRAVENOUS | Status: DC

## 2020-02-24 MED ORDER — METOCLOPRAMIDE HCL 5 MG PO TABS: 5.0000 mg | ORAL_TABLET | Freq: Three times a day (TID) | ORAL | Status: DC | PRN

## 2020-02-24 SURGICAL SUPPLY — 83 items
APL SKNCLS STERI-STRIP NONHPOA (GAUZE/BANDAGES/DRESSINGS) ×2
ATTUNE PS FEM LT SZ 6 CEM KNEE (Femur) ×1 IMPLANT
ATTUNE PSRP INSR SZ6 5 KNEE (Insert) ×1 IMPLANT
BANDAGE ESMARK 6X9 LF (GAUZE/BANDAGES/DRESSINGS) ×2 IMPLANT
BASE TIBIAL ROT PLAT SZ 5 KNEE (Knees) IMPLANT
BENZOIN TINCTURE PRP APPL 2/3 (GAUZE/BANDAGES/DRESSINGS) ×3 IMPLANT
BLADE SAGITTAL 25.0X1.19X90 (BLADE) ×3 IMPLANT
BLADE SAW SGTL 13X75X1.27 (BLADE) ×3 IMPLANT
BNDG CMPR 9X6 STRL LF SNTH (GAUZE/BANDAGES/DRESSINGS) ×2
BNDG CMPR MED 10X6 ELC LF (GAUZE/BANDAGES/DRESSINGS) ×2
BNDG ELASTIC 4X5.8 VLCR STR LF (GAUZE/BANDAGES/DRESSINGS) ×3 IMPLANT
BNDG ELASTIC 6X10 VLCR STRL LF (GAUZE/BANDAGES/DRESSINGS) ×1 IMPLANT
BNDG ESMARK 6X9 LF (GAUZE/BANDAGES/DRESSINGS) ×3
BNDG GAUZE ELAST 4 BULKY (GAUZE/BANDAGES/DRESSINGS) ×1 IMPLANT
BOWL SMART MIX CTS (DISPOSABLE) ×3 IMPLANT
BSPLAT TIB 5 CMNT ROT PLAT STR (Knees) ×2 IMPLANT
CEMENT HV SMART SET (Cement) ×5 IMPLANT
COVER SURGICAL LIGHT HANDLE (MISCELLANEOUS) ×3 IMPLANT
COVER WAND RF STERILE (DRAPES) ×3 IMPLANT
CUFF TOURN SGL QUICK 34 (TOURNIQUET CUFF) ×3
CUFF TOURN SGL QUICK 42 (TOURNIQUET CUFF) IMPLANT
CUFF TRNQT CYL 34X4.125X (TOURNIQUET CUFF) ×2 IMPLANT
DRAPE ORTHO SPLIT 77X108 STRL (DRAPES) ×6
DRAPE SURG ORHT 6 SPLT 77X108 (DRAPES) ×4 IMPLANT
DRAPE U-SHAPE 47X51 STRL (DRAPES) ×3 IMPLANT
DRSG MEPILEX BORDER 4X12 (GAUZE/BANDAGES/DRESSINGS) ×1 IMPLANT
DRSG PAD ABDOMINAL 8X10 ST (GAUZE/BANDAGES/DRESSINGS) ×3 IMPLANT
DURAPREP 26ML APPLICATOR (WOUND CARE) ×6 IMPLANT
ELECT REM PT RETURN 9FT ADLT (ELECTROSURGICAL) ×3
ELECTRODE REM PT RTRN 9FT ADLT (ELECTROSURGICAL) ×2 IMPLANT
EVACUATOR 1/8 PVC DRAIN (DRAIN) IMPLANT
FACESHIELD WRAPAROUND (MASK) ×6 IMPLANT
FACESHIELD WRAPAROUND OR TEAM (MASK) ×4 IMPLANT
GAUZE SPONGE 4X4 12PLY STRL (GAUZE/BANDAGES/DRESSINGS) ×3 IMPLANT
GAUZE XEROFORM 5X9 LF (GAUZE/BANDAGES/DRESSINGS) ×3 IMPLANT
GLOVE BIO SURGEON STRL SZ 6.5 (GLOVE) ×1 IMPLANT
GLOVE BIOGEL PI IND STRL 7.5 (GLOVE) IMPLANT
GLOVE BIOGEL PI IND STRL 8 (GLOVE) ×4 IMPLANT
GLOVE BIOGEL PI INDICATOR 7.5 (GLOVE) ×1
GLOVE BIOGEL PI INDICATOR 8 (GLOVE) ×2
GLOVE INDICATOR 7.5 STRL GRN (GLOVE) ×1 IMPLANT
GLOVE ORTHO TXT STRL SZ7.5 (GLOVE) ×6 IMPLANT
GOWN STRL REUS W/ TWL LRG LVL3 (GOWN DISPOSABLE) ×2 IMPLANT
GOWN STRL REUS W/ TWL XL LVL3 (GOWN DISPOSABLE) ×2 IMPLANT
GOWN STRL REUS W/TWL 2XL LVL3 (GOWN DISPOSABLE) ×3 IMPLANT
GOWN STRL REUS W/TWL LRG LVL3 (GOWN DISPOSABLE) ×3
GOWN STRL REUS W/TWL XL LVL3 (GOWN DISPOSABLE) ×3
HANDPIECE INTERPULSE COAX TIP (DISPOSABLE) ×3
IMMOBILIZER KNEE 22 UNIV (SOFTGOODS) ×3 IMPLANT
KIT BASIN OR (CUSTOM PROCEDURE TRAY) ×3 IMPLANT
KIT TURNOVER KIT B (KITS) ×3 IMPLANT
MANIFOLD NEPTUNE II (INSTRUMENTS) ×3 IMPLANT
MARKER SKIN DUAL TIP RULER LAB (MISCELLANEOUS) ×3 IMPLANT
NDL 18GX1X1/2 (RX/OR ONLY) (NEEDLE) ×2 IMPLANT
NDL HYPO 25GX1X1/2 BEV (NEEDLE) ×2 IMPLANT
NEEDLE 18GX1X1/2 (RX/OR ONLY) (NEEDLE) ×3 IMPLANT
NEEDLE HYPO 25GX1X1/2 BEV (NEEDLE) ×3 IMPLANT
NS IRRIG 1000ML POUR BTL (IV SOLUTION) ×3 IMPLANT
PACK TOTAL JOINT (CUSTOM PROCEDURE TRAY) ×3 IMPLANT
PAD ARMBOARD 7.5X6 YLW CONV (MISCELLANEOUS) ×6 IMPLANT
PAD CAST 4YDX4 CTTN HI CHSV (CAST SUPPLIES) ×2 IMPLANT
PADDING CAST COTTON 4X4 STRL (CAST SUPPLIES) ×3
PADDING CAST COTTON 6X4 STRL (CAST SUPPLIES) ×3 IMPLANT
PATELLA MEDIAL ATTUN 35MM KNEE (Knees) ×1 IMPLANT
PIN DRILL FIX HALF THREAD (BIT) ×1 IMPLANT
PIN STEINMAN FIXATION KNEE (PIN) ×1 IMPLANT
SET HNDPC FAN SPRY TIP SCT (DISPOSABLE) ×2 IMPLANT
STAPLER VISISTAT 35W (STAPLE) ×1 IMPLANT
SUCTION FRAZIER HANDLE 10FR (MISCELLANEOUS) ×3
SUCTION TUBE FRAZIER 10FR DISP (MISCELLANEOUS) ×2 IMPLANT
SUT VIC AB 0 CT1 27 (SUTURE) ×3
SUT VIC AB 0 CT1 27XBRD ANBCTR (SUTURE) ×2 IMPLANT
SUT VIC AB 1 CTX 36 (SUTURE) ×6
SUT VIC AB 1 CTX36XBRD ANBCTR (SUTURE) ×4 IMPLANT
SUT VIC AB 2-0 CT1 27 (SUTURE) ×6
SUT VIC AB 2-0 CT1 TAPERPNT 27 (SUTURE) ×4 IMPLANT
SUT VIC AB 3-0 X1 27 (SUTURE) ×3 IMPLANT
SYR 50ML LL SCALE MARK (SYRINGE) ×3 IMPLANT
SYR CONTROL 10ML LL (SYRINGE) ×4 IMPLANT
TIBIAL BASE ROT PLAT SZ 5 KNEE (Knees) ×3 IMPLANT
TOWEL GREEN STERILE (TOWEL DISPOSABLE) ×3 IMPLANT
TOWEL GREEN STERILE FF (TOWEL DISPOSABLE) ×3 IMPLANT
TRAY CATH 16FR W/PLASTIC CATH (SET/KITS/TRAYS/PACK) IMPLANT

## 2020-02-24 NOTE — Anesthesia Procedure Notes (Signed)
Procedure Name: MAC Date/Time: 02/24/2020 12:42 PM Performed by: Jenne Campus, CRNA Pre-anesthesia Checklist: Patient identified, Emergency Drugs available, Suction available and Patient being monitored Oxygen Delivery Method: Simple face mask

## 2020-02-24 NOTE — Anesthesia Postprocedure Evaluation (Signed)
Anesthesia Post Note  Patient: [name]  Procedure(s) Performed: LEFT TOTAL KNEE ARTHROPLASTY (Left Knee) RIGHT KNEE STEROID INTRA-ARTICULAR MARCAINE/DEPO-MEDROL INJECTION UNDER ANESTHESIA (Right Knee) Hardware Removal, left knee (Left Knee)     Patient location during evaluation: PACU Anesthesia Type: Spinal and Regional Level of consciousness: awake and alert, oriented and patient cooperative Pain management: pain level controlled Vital Signs Assessment: post-procedure vital signs reviewed and stable Respiratory status: spontaneous breathing, nonlabored ventilation and respiratory function stable Cardiovascular status: blood pressure returned to baseline and stable Postop Assessment: no apparent nausea or vomiting, patient able to bend at knees and spinal receding Anesthetic complications: no    Last Vitals:  Vitals:   02/24/20 1606 02/24/20 1646  BP: 98/63 (!) 103/58  Pulse: (!) 55 (!) 52  Resp: 12 16  Temp: (!) 36.2 C (!) 36.3 C  SpO2: 95% 94%    Last Pain:  Vitals:   02/24/20 1646  TempSrc: Oral  PainSc:                  Amario Longmore,E. Zaccheaus Storlie

## 2020-02-24 NOTE — Interval H&P Note (Signed)
History and Physical Interval Note:  02/24/2020 12:22 PM  [name]  has presented today for surgery, with the diagnosis of left knee degerative joint disease, right knee degenerative joint disease.  The various methods of treatment have been discussed with the patient and family. After consideration of risks, benefits and other options for treatment, the patient has consented to  Procedure(s): LEFT TOTAL KNEE ARTHROPLASTY (Left) RIGHT KNEE STEROID INTRA-ARTICULAR MARCAINE/DEPO-MEDROL INJECTION UNDER ANESTHESIA (Right) as a surgical intervention.  The patient's history has been reviewed, patient examined, no change in status, stable for surgery.  I have reviewed the patient's chart and labs.  Questions were answered to the patient's satisfaction.     [name]

## 2020-02-24 NOTE — Progress Notes (Signed)
Orthopedic Tech Progress Note Patient Details:  [name] May 22, 1933 [id number]  CPM Left Knee CPM Left Knee: Off Left Knee Flexion (Degrees): 90 Left Knee Extension (Degrees): 0  Post Interventions Patient Tolerated: Well Instructions Provided: Adjustment of device     Post Interventions Patient Tolerated: Well Instructions Provided: Adjustment of device   Caidence Higashi E Kamrin Spath 02/24/2020, 4:16 PM

## 2020-02-24 NOTE — Op Note (Signed)
Preop diagnosis: Previous tibial plateau fracture with left knee posttraumatic osteoarthritis  Postop diagnosis: Same  Procedure: Hardware removal proximal tibial plateau, left total knee arthroplasty.  Surgeon: Rodell Perna, MD  Assistant: Benjiman Core, PA-C medically necessary and present for the entire procedure  Anesthesia: Spinal +20 cc Marcaine 23 cc Exparel prior to tourniquet deflation.  Implants:Depuy Attune size 6 femur size 5 tibia 5 mm rotating platform.  35 mm 3 peg patella.  Tourniquet: 350 x 53 minutes.  Procedure: After induction of spinal anesthesia proximal thigh tourniquet lateral post heel bump prepped with DuraPrep the tip the toes usual total knee sheets drapes sterile skin marker Betadine Steri-Drape impervious stockinette and Coban were all applied.  Timeout procedure was completed TXA was given.  Leg was wrapped in Esmarch tourniquet inflated midline incision was made by icing it following the old incision from the previous patient's previous arthrotomy.  Patella was everted with medial arthrotomy.  Menisci resected there is tricompartmental degenerative changes.  Intramedullary hold drilled in the femur sized for a 6.  Chamfer cuts were made.  Resection on the tibia was done freehand to take slightly more to get below the 2 pins that were present from all fixation of the tibial plateau fracture.  Patient had previous hardware all of been removed except for the 2 pins.  Initially cut was made bumped into 1 pin which pushed it medial out through the skin it was removed.  Second plan was not taken until we dropped down to more millimeters since the spacer block would not quite foot fit in.  After this we took an additional 2 mm off the femur and allowed full extension good balance.  Sizing the tibia was for a 5 keel preparation was made all excessive bone was removed there was some large posterior spurs present which had to be removed three-quarter curved osteotome.  Meniscal  remnants and ACL PCL were all resected trials were inserted good range of motion good collateral balance.  Trials were removed pulse lavage bone cement was mixed under vacuum and then we cemented the tibia followed by femur placement of the permanent 5 mm rotating platform and then 3 peg patella.  Cement was hard at 15 minutes tourniquet deflated while cement was setting up we injected skin subtenons tissue with Exparel plus Marcaine 20+20 = 40 cc total.  Tourniquet deflation hemostasis obtained in standard layered closure interrupted sutures in the deep retinaculum 2 on the subtenons tissue skin staple closure postop dressing knee immobilizer.  Instrument count needle count was correct patient tolerated the procedure well.

## 2020-02-24 NOTE — Progress Notes (Addendum)
Received patient from PACU. Alert and verbal. B/P 70/48. Patient c/o nausea.  MD notified. NEW order to give 365ml NS bolus. Current B/P 103/58.   1850: Post Bolus B/P 104/57. Nausea resolved.

## 2020-02-24 NOTE — Anesthesia Procedure Notes (Signed)
Spinal  Patient location during procedure: OR End time: 02/24/2020 12:46 PM Staffing Performed: anesthesiologist  Anesthesiologist: Annye Asa, MD Preanesthetic Checklist Completed: patient identified, IV checked, site marked, risks and benefits discussed, surgical consent, monitors and equipment checked, pre-op evaluation and timeout performed Spinal Block Patient position: sitting Prep: DuraPrep and site prepped and draped Patient monitoring: blood pressure, continuous pulse ox, cardiac monitor and heart rate Approach: midline Location: L3-4 Injection technique: single-shot Needle Needle type: Pencan  Needle gauge: 24 G Needle length: 9 cm Additional Notes Pt identified in Operating room.  Monitors applied. Working IV access confirmed. Sterile prep, drape lumbar spine.  1% lido local L 3,4.  #24ga Pencan into clear CSF L 3,4.  12mg  0.75% Bupivacaine with dextrose injected with asp CSF beginning and end of injection.  Patient asymptomatic, VSS, no heme aspirated, tolerated well.  Jenita Seashore, MD

## 2020-02-24 NOTE — Plan of Care (Signed)

## 2020-02-24 NOTE — Care Plan (Signed)
Ortho Bundle Case Management Note  Patient Details  Name: [name] MRN: [id number] Date of Birth: 1933/07/07  [facility] call to patient pre-op to discuss her upcoming Left TKA with Dr. Lorin Mercy on 02/24/20. She is an Ortho bundle patient through [facility] and is agreeable to CM. She has a daughter that will be able to assist at home after surgery. She has a FWW and requested a riser for toilet. She states her home is too small for a 3in1. Will discuss again with her prior to discharge as Medicare will not cover a riser vs. 3in1. Anticipate HHPT will be needed after short hospital stay. Choice provided and referral to [facility]. Will continue to follow for CM needs.                   DME Arranged:  (Patient has a FWW, but requested a toilet riser instead of 3in1; will discuss with her again and see if a 3in1 will fit in her home) DME Agency:     [place]:  PT [place]:  [facility] (now [facility])  Additional Comments: Please contact me with any questions of if this plan should need to change.  Jamse Arn, RN, BSN, [employer]  [phone] 02/24/2020, 2:47 PM

## 2020-02-24 NOTE — Progress Notes (Signed)
PT Cancellation Note  Patient Details Name: NUSAIBAH HEIDEL MRN: [id number] DOB: 10-29-1932   Cancelled Treatment:    Reason Eval/Treat Not Completed: Medical issues which prohibited therapy RN requesting to hold as pt hypotensive and very nauseous. Will follow up as pt medically appropriate and as schedule allows.   Lou Miner, DPT  Acute Rehabilitation Services  Pager: [phone] Office: [phone]    Rudean Hitt 02/24/2020, 4:59 PM

## 2020-02-24 NOTE — H&P (Signed)
TOTAL KNEE ADMISSION H&P  Patient is being admitted for left total knee arthroplasty.  Subjective:  Chief Complaint:left knee pain.  HPI: [name], 86 y.o. female, has a history of pain and functional disability in the left knee due to arthritis and has failed non-surgical conservative treatments for greater than 12 weeks to includeNSAID's and/or analgesics, corticosteriod injections, use of assistive devices, weight reduction as appropriate and activity modification.  Onset of symptoms was gradual, starting 10 years ago with gradually worsening course since that time.   Patient currently rates pain in the left knee(s) at 10 out of 10 with activity. Patient has night pain, worsening of pain with activity and weight bearing, pain that interferes with activities of daily living, pain with passive range of motion, crepitus and joint swelling.  Patient has evidence of subchondral cysts, subchondral sclerosis, periarticular osteophytes and joint space narrowing by imaging studies.  There is no active infection.  Patient Active Problem List   Diagnosis Date Noted  . History of total hip arthroplasty, right 08/21/2019  . Intermittent claudication ([place]) 07/08/2019  . Hypertensive nephropathy 12/15/2018  . Chronic renal disease, stage II 12/15/2018  . H/O total hip arthroplasty, left 11/07/2017  . Post-traumatic osteoarthritis of left knee 11/07/2017  . Unilateral primary osteoarthritis, right knee 11/07/2017  . Presence of left artificial hip joint 05/14/2017  . Preop cardiovascular exam 03/12/2017  . Cough   . Abdominal pain   . Symptomatic cholelithiasis 07/01/2016  . Nonspecific ST-T wave electrocardiographic changes   . Lower leg edema 02/16/2015  . Obesity (BMI 30.0-34.9) 02/16/2015  . Essential hypertension 01/11/2012  . Hyperthyroidism 01/11/2012  . Hypercholesteremia 01/11/2012  . H/O vaginal hysterectomy 1974 01/11/2012  . S/P removal of lung  partial R lobe  2010 01/11/2012  .  History of hernia repair  R ing. 1975 01/11/2012  . BACK PAIN 11/12/2007  . DYSPNEA 11/11/2007   Past Medical History:  Diagnosis Date  . Aortic stenosis    Echo 07/05/16: Very mild AS, Mean gradient 10 mm Hg, Peak gradient (S) 19 mmHg   . Arthritis    left hip and back  . Bronchiectasis    isolated to RML; status post right middle lobe partial resection.  . Complication of anesthesia    hard to wake up  . Dyspnea   . Gall stones   . GERD (gastroesophageal reflux disease)    history   . H/O osteoporosis   . Hypertension   . Hyperthyroidism    endocrinologist - Dr. Chalmers Cater  . Pneumonia   . Yeast infection     Past Surgical History:  Procedure Laterality Date  . ABDOMINAL HYSTERECTOMY  1974  . CHOLECYSTECTOMY N/A 07/02/2016   Procedure: LAPAROSCOPIC CHOLECYSTECTOMY;  Surgeon: Stark Klein, MD;  Location: [place];  Service: General;  Laterality: N/A;  . COLONOSCOPY    . ENDOSCOPIC RETROGRADE CHOLANGIOPANCREATOGRAPHY (ERCP) WITH PROPOFOL N/A 08/07/2018   Procedure: ENDOSCOPIC RETROGRADE CHOLANGIOPANCREATOGRAPHY (ERCP) WITH PROPOFOL;  Surgeon: Carol Ada, MD;  Location: [facility];  Service: Endoscopy;  Laterality: N/A;  . EYE SURGERY     stye removed  . HERNIA REPAIR  1975  . KNEE SURGERY Left   . LUNG REMOVAL, PARTIAL  208   RML  . NM MYOVIEW LTD  06/2011   Normal EF. No ischemia or infarction.  [name]  08/07/2018   Procedure: [name];  Surgeon: Carol Ada, MD;  Location: [facility];  Service: Endoscopy;;  balloon sweep  . TOTAL HIP ARTHROPLASTY Left 04/05/2017  Procedure: LEFT TOTAL HIP ARTHROPLASTY ANTERIOR APPROACH;  Surgeon: [name], MD;  Location: [place];  Service: Orthopedics;  Laterality: Left;  . TOTAL HIP ARTHROPLASTY Right 12/19/2018  . TOTAL HIP ARTHROPLASTY Right 12/19/2018   Procedure: RIGHT TOTAL HIP ARTHROPLASTY-DIRECT ANTERIOR APPROACH;  Surgeon: [name], MD;  Location: [place];  Service: Orthopedics;  Laterality: Right;  .  TRANSTHORACIC ECHOCARDIOGRAM  06/2011   Mild concentric LVH. Normal EF with impaired relaxation. Mildly elevated RV pressures of 30 and 40 mmHg.  Marland Kitchen TRANSTHORACIC ECHOCARDIOGRAM  06/2016   normal LV size and function. EF 60-65% with GRD 2 DD. Mild aortic stenosis. Mild LA dilation. Mild to moderately increased PA pressures (46 mmHg).  . TUBAL LIGATION      Current Facility-Administered Medications  Medication Dose Route Frequency Provider Last Rate Last Admin  . bupivacaine liposome (EXPAREL) 1.3 % injection 266 mg  20 mL Infiltration Once [name], MD       Current Outpatient Medications  Medication Sig Dispense Refill Last Dose  . acetaminophen (TYLENOL 8 HOUR) 650 MG CR tablet Take 650 mg by mouth every 8 (eight) hours as needed for pain.     . Ascorbic Acid 500 MG CHEW Chew 500 mg by mouth daily.      . busPIRone (BUSPAR) 5 MG tablet Take 1 tablet (5 mg total) by mouth 2 (two) times daily. (Patient taking differently: Take 5 mg by mouth daily as needed (anxiety). ) 60 tablet 1   . calcium carbonate (CALCIUM 600) 600 MG TABS tablet Take 600 mg by mouth daily.     . cholecalciferol (VITAMIN D3) 25 MCG (1000 UT) tablet Take 1,000 Units by mouth daily.     . diclofenac Sodium (VOLTAREN) 1 % GEL Apply 2 g topically 4 (four) times daily. (Patient taking differently: Apply 2 g topically 4 (four) times daily as needed (pain). ) 150 g 1   . Flaxseed, Linseed, (FLAXSEED OIL) 1000 MG CAPS Take 1,000 mg by mouth daily.     . furosemide (LASIX) 20 MG tablet Take 20 mg by mouth daily.       . methimazole (TAPAZOLE) 5 MG tablet Take 2.5 mg by mouth daily.      . metoprolol succinate (TOPROL XL) 25 MG 24 hr tablet Take 1 tablet (25 mg total) by mouth daily. 90 tablet 3   . nystatin cream (MYCOSTATIN) APPLY CREAM TO AFFECTED AREA TWICE A DAY AS NEEDED (Patient taking differently: Apply 1 application topically 2 (two) times daily as needed for dry skin. ) 30 g 23   . potassium chloride SA (KLOR-CON) 20  MEQ tablet Take 1 tablet (20 mEq total) by mouth daily. 90 tablet 1   . TRICOR 145 MG tablet Take 1 tablet (145 mg total) by mouth daily. 90 tablet 1   . sulfamethoxazole-trimethoprim (BACTRIM DS) 800-160 MG tablet 1 po bid x 5 days 10 tablet 0    Allergies  Allergen Reactions  . Ibuprofen Other (See Comments)    hallucinations  . Naproxen Sodium Other (See Comments)    hallucinations    Social History   Tobacco Use  . Smoking status: Never Smoker  . Smokeless tobacco: Never Used  Substance Use Topics  . Alcohol use: No    Family History  Problem Relation Age of Onset  . Hypertension Mother   . CVA Mother 45  . Heart attack Sister   . Diabetes Sister   . Healthy Father   . Heart  attack Brother   . Heart attack Brother      Review of Systems  Constitutional: Positive for activity change.  HENT: Negative.   Respiratory: Negative.   Cardiovascular: Negative.   Gastrointestinal: Negative.   Genitourinary: Negative.   Musculoskeletal: Positive for gait problem and joint swelling.  Psychiatric/Behavioral: Negative.     Objective:  Physical Exam  Constitutional: She is oriented to person, place, and time. No distress.  HENT:  Head: Normocephalic and atraumatic.  Eyes: Pupils are equal, round, and reactive to light. EOM are normal.  Cardiovascular: Normal heart sounds.  Respiratory: No respiratory distress. She has no wheezes.  GI: She exhibits no distension. There is no abdominal tenderness.  Musculoskeletal:        General: Tenderness present.  Neurological: She is alert and oriented to person, place, and time.  Skin: Skin is warm and dry.  Psychiatric: She has a normal mood and affect.    Vital signs in last 24 hours:    Labs:   Estimated body mass index is 38.92 kg/m as calculated from the following:   Height as of 02/11/20: 5' 0.5" (1.537 m).   Weight as of 02/18/20: 91.9 kg.   Imaging Review Plain radiographs demonstrate moderate degenerative joint  disease of the left knee(s). The overall alignment ismild varus. The bone quality appears to be good for age and reported activity level.      Assessment/Plan:  End stage arthritis, left knee  Right knee pain secondary to djd    The patient history, physical examination, clinical judgment of the provider and imaging studies are consistent with end stage degenerative joint disease of the left knee(s) and total knee arthroplasty is deemed medically necessary. The treatment options including medical management, injection therapy arthroscopy and arthroplasty were discussed at length. The risks and benefits of total knee arthroplasty were presented and reviewed. The risks due to aseptic loosening, infection, stiffness, patella tracking problems, thromboembolic complications and other imponderables were discussed. The patient acknowledged the explanation, agreed to proceed with the plan and consent was signed. Patient is being admitted for inpatient treatment for surgery, pain control, PT, OT, prophylactic antibiotics, VTE prophylaxis, progressive ambulation and ADL's and discharge planning. The patient is planning to be discharged home with home health services  We will also perform right knee intra-articular marcaine/depomedrol injection under anesthesia  Anticipated LOS equal to or greater than 2 midnights due to - Age 76 and older with one or more of the following:  - Obesity  - Expected need for hospital services (PT, OT, Nursing) required for safe  discharge  - Anticipated need for postoperative skilled nursing care or inpatient rehab  - Active co-morbidities: None OR   - Unanticipated findings during/Post Surgery: None  - Patient is a high risk of re-admission due to: None

## 2020-02-24 NOTE — Transfer of Care (Signed)
Immediate Anesthesia Transfer of Care Note  Patient: ALIANI CACCAVALE  Procedure(s) Performed: LEFT TOTAL KNEE ARTHROPLASTY (Left Knee) RIGHT KNEE STEROID INTRA-ARTICULAR MARCAINE/DEPO-MEDROL INJECTION UNDER ANESTHESIA (Right Knee) Hardware Removal, left knee (Left Knee)  Patient Location: PACU  Anesthesia Type:MAC and Spinal  Level of Consciousness: awake, oriented and patient cooperative  Airway & Oxygen Therapy: Patient Spontanous Breathing and Patient connected to face mask oxygen  Post-op Assessment: Report given to RN and Post -op Vital signs reviewed and stable  Post vital signs: Reviewed  Last Vitals:  Vitals Value Taken Time  BP    Temp    Pulse 69 02/24/20 1507  Resp 18 02/24/20 1507  SpO2 96 % 02/24/20 1507  Vitals shown include unvalidated device data.  Last Pain:  Vitals:   02/24/20 1215  TempSrc:   PainSc: 0-No pain      Patients Stated Pain Goal: 3 (35/52/17 4715)  Complications: No apparent anesthesia complications

## 2020-02-24 NOTE — Anesthesia Procedure Notes (Signed)
Anesthesia Regional Block: Adductor canal block   Pre-Anesthetic Checklist: ,, timeout performed, Correct Patient, Correct Site, Correct Laterality, Correct Procedure, Correct Position, site marked, Risks and benefits discussed,  Surgical consent,  Pre-op evaluation,  At surgeon's request and post-op pain management  Laterality: Left and Lower  Prep: chloraprep       Needles:  Injection technique: Single-shot  Needle Type: Echogenic Needle     Needle Length: 9cm  Needle Gauge: 21     Additional Needles:   Procedures:,,,, ultrasound used (permanent image in chart),,,,  Narrative:  Start time: 02/24/2020 12:10 PM End time: 02/24/2020 12:16 PM Injection made incrementally with aspirations every 5 mL.  Performed by: Personally  Anesthesiologist: Annye Asa, MD  Additional Notes: Pt identified in Holding room.  Monitors applied. Working IV access confirmed. Sterile prep L thigh.  #21ga ECHOgenic needle into adductor canal with US guidance.  20cc 0.75% Ropivacaine injected incrementally after negative test dose.  Patient asymptomatic, VSS, no heme aspirated, tolerated well.  Jenita Seashore, MD

## 2020-02-25 ENCOUNTER — Encounter: Payer: Self-pay | Admitting: *Deleted

## 2020-02-25 LAB — BASIC METABOLIC PANEL
Anion gap: 8 (ref 5–15)
BUN: 12 mg/dL (ref 8–23)
CO2: 21 mmol/L — ABNORMAL LOW (ref 22–32)
Calcium: 8.4 mg/dL — ABNORMAL LOW (ref 8.9–10.3)
Chloride: 106 mmol/L (ref 98–111)
Creatinine, Ser: 0.82 mg/dL (ref 0.44–1.00)
GFR calc Af Amer: >60 mL/min (ref >60)
GFR calc non Af Amer: >60 mL/min (ref >60)
Glucose, Bld: 167 mg/dL — ABNORMAL HIGH (ref 70–99)
Potassium: 4.7 mmol/L (ref 3.5–5.1)
Sodium: 135 mmol/L (ref 135–145)

## 2020-02-25 LAB — CBC
HCT: 29.1 % — ABNORMAL LOW (ref 36.0–46.0)
Hemoglobin: 10.2 g/dL — ABNORMAL LOW (ref 12.0–15.0)
MCH: 28.6 pg (ref 26.0–34.0)
MCHC: 35.1 g/dL (ref 30.0–36.0)
MCV: 81.5 fL (ref 80.0–100.0)
Platelets: 279 10*3/uL (ref 150–400)
RBC: 3.57 MIL/uL — ABNORMAL LOW (ref 3.87–5.11)
RDW: 14 % (ref 11.5–15.5)
WBC: 9.2 10*3/uL (ref 4.0–10.5)
nRBC: 0 % (ref 0.0–0.2)

## 2020-02-25 NOTE — Progress Notes (Signed)
   Subjective: 1 Day Post-Op Procedure(s) (LRB): LEFT TOTAL KNEE ARTHROPLASTY (Left) RIGHT KNEE STEROID INTRA-ARTICULAR MARCAINE/DEPO-MEDROL INJECTION UNDER ANESTHESIA (Right) Hardware Removal, left knee (Left) Patient reports pain as moderate and severe.    Objective: Vital signs in last 24 hours: Temp:  [97.1 F (36.2 C)-98 F (36.7 C)] 97.8 F (36.6 C) (05/20 0349) Pulse Rate:  [42-63] 63 (05/20 0909) Resp:  [12-21] 15 (05/20 0349) BP: (98-161)/(46-78) 119/58 (05/20 0909) SpO2:  [94 %-100 %] 97 % (05/20 0349) Weight:  [89.4 kg] 89.4 kg (05/19 1035)  Intake/Output from previous day: 05/19 0701 - 05/20 0700 In: 2619.9 [P.O.:1080; I.V.:1539.9] Out: 1950 [Urine:1900; Blood:50] Intake/Output this shift: Total I/O In: -  Out: 700 [Urine:700]  Recent Labs    02/25/20 0228  HGB 10.2*   Recent Labs    02/25/20 0228  WBC 9.2  RBC 3.57*  HCT 29.1*  PLT 279   Recent Labs    02/25/20 0228  NA 135  K 4.7  CL 106  CO2 21*  BUN 12  CREATININE 0.82  GLUCOSE 167*  CALCIUM 8.4*   No results for input(s): LABPT, INR in the last 72 hours.  Neurologically intact DG Knee Left Port  Result Date: 02/24/2020 CLINICAL DATA:  Status post left knee replacement EXAM: PORTABLE LEFT KNEE - 1-2 VIEW COMPARISON:  01/26/2019 FINDINGS: Left knee prosthesis is now seen in satisfactory position. Previously seen fixation pins have been removed. No soft tissue or bony abnormality is noted. IMPRESSION: Status post left knee replacement. Electronically Signed   By: Inez Catalina M.D.   On: 02/24/2020 16:53    Assessment/Plan: 1 Day Post-Op Procedure(s) (LRB): LEFT TOTAL KNEE ARTHROPLASTY (Left) RIGHT KNEE STEROID INTRA-ARTICULAR MARCAINE/DEPO-MEDROL INJECTION UNDER ANESTHESIA (Right) Hardware Removal, left knee (Left) Up with therapy, has not been OOB yet. BP a little low holding Lasix this AM.   [name] 02/25/2020, 9:10 AM

## 2020-02-25 NOTE — Progress Notes (Signed)
Physical Therapy Treatment Patient Details Name: [name] MRN: [id number] DOB: Jan 03, 1933 Today's Date: 02/25/2020    History of Present Illness Pt is an 86 yo female presenting s/p L TKA 5/19 with removal of hardware from prior tibial plateau fx. PMH includes: bilateral THA (L: 03/2017, R: 08/2019), chronic renal disease II, HTN, obesity, and R knee OA.    PT Comments    Pt in bed upon arrival of PT, agreeable to PT session with focus on progression of LE exercises for functional strength and ROM.  The pt was able to complete multiple general LE exercises that progress functional ROM and strength to facilitate transfers, bed mobility, and ambulation with improved fluidity and less pain. The pt demonstrated good technique and was given a handout with instructions for completion outside of PT sessions. The pt was also able to demo good capacity for lateral scooting with good hip clearance and no assist or use of AD. The pt continues to present with limitations in functional ROM, strength, stability, and endurance that make functional transfers, gait, and self-care difficult at this time. She will continue to benefit from skilled PT to progress these deficits and improve functional mobility and independence prior to d/c.    Follow Up Recommendations  Follow surgeon's recommendation for DC plan and follow-up therapies;Supervision/Assistance - 24 hour     Equipment Recommendations  None recommended by PT    Recommendations for Other Services       Precautions / Restrictions Precautions Precautions: Fall;Knee Precaution Booklet Issued: Yes (comment) Precaution Comments: discussed verbally Restrictions Weight Bearing Restrictions: Yes LLE Weight Bearing: Weight bearing as tolerated    Mobility  Bed Mobility Overal bed mobility: Needs Assistance Bed Mobility: Supine to Sit     Supine to sit: Min assist     General bed mobility comments: minG to come to EOB,minA to mobilize LLE,  but heavy use of bed rail  Transfers Overall transfer level: Needs assistance Equipment used: Rolling walker (2 wheeled) Transfers: Lateral/Scoot Transfers Sit to Stand: Min assist        Lateral/Scoot Transfers: Supervision General transfer comment: pt able to laterally scoot to [facility] without assist, good stability and hip clearance this afternoon  Ambulation/Gait Ambulation/Gait assistance: Min guard Gait Distance (Feet): 10 Feet Assistive device: Rolling walker (2 wheeled) Gait Pattern/deviations: Step-to pattern;Decreased stance time - left;Decreased weight shift to left;Shuffle;Decreased dorsiflexion - left Gait velocity: decreased Gait velocity interpretation: <1.31 ft/sec, indicative of household ambulator General Gait Details: pt with small steps in room with antalgic gait andreduced wb on L LE. Pt able to demo good turns and use of RW, no LOB   Stairs             Wheelchair Mobility    Modified Rankin (Stroke Patients Only)       Balance Overall balance assessment: Mild deficits observed, not formally tested                                          Cognition Arousal/Alertness: Awake/alert Behavior During Therapy: WFL for tasks assessed/performed Overall Cognitive Status: Within Functional Limits for tasks assessed                                 General Comments: pt plesant and agreeable to PT assist, good concern for safety  Exercises Total Joint Exercises Ankle Circles/Pumps: AROM;Both;15 reps;Supine Quad Sets: AROM;Both;10 reps;Supine Heel Slides: AROM;AAROM;Both;10 reps;Supine(AROM RLE, AAROM LLE) Hip ABduction/ADduction: AROM;10 reps;Supine;AAROM;Both(AROM RLE, AAROM LLE) Knee Flexion: AAROM;Left;10 reps;Seated    General Comments General comments (skin integrity, edema, etc.): VSS on RA      Pertinent Vitals/Pain Pain Assessment: Faces Faces Pain Scale: Hurts little more Pain Location: L knee (surgical  site) Pain Descriptors / Indicators: Grimacing;Sore Pain Intervention(s): Limited activity within patient's tolerance;Monitored during session;Repositioned;Premedicated before session    [place] expects to be discharged to:: Private residence Living Arrangements: Alone Available Help at Discharge: Family;Available PRN/intermittently(daughter works during the day, can check on her mom. pt's kids can stay with her over weekend) Type of Home: House Home Access: Ramped entrance   Home Layout: Two level;Laundry or work area in basement;Able to live on main level with bedroom/bathroom Home Equipment: Environmental consultant - 4 wheels;Walker - 2 wheels;Cane - quad;Cane - single point;Shower seat;Grab bars - tub/shower;Grab bars - toilet Additional Comments: pt reports she would like seat riser.    Prior Function Level of Independence: Independent with assistive device(s)      Comments: pt reports use of "whatever is nearby" but mostly uses rollator and SPC. general furnture walking at times.   PT Goals (current goals can now be found in the care plan section) Acute Rehab PT Goals Patient Stated Goal: return home PT Goal Formulation: With patient Time For Goal Achievement: 03/10/20 Potential to Achieve Goals: Good Progress towards PT goals: Progressing toward goals    Frequency    7X/week      PT Plan Current plan remains appropriate    Co-evaluation              AM-PAC PT "6 Clicks" Mobility   Outcome Measure  Help needed turning from your back to your side while in a flat bed without using bedrails?: A Little Help needed moving from lying on your back to sitting on the side of a flat bed without using bedrails?: A Little Help needed moving to and from a bed to a chair (including a wheelchair)?: A Little Help needed standing up from a chair using your arms (e.g., wheelchair or bedside chair)?: A Little Help needed to walk in hospital room?: A Little Help needed  climbing 3-5 steps with a railing? : A Lot 6 Click Score: 17    End of Session Equipment Utilized During Treatment: Gait belt Activity Tolerance: Patient tolerated treatment well Patient left: in chair;with call bell/phone within reach Nurse Communication: Mobility status PT Visit Diagnosis: Difficulty in walking, not elsewhere classified (R26.2);Muscle weakness (generalized) (M62.81);Pain Pain - Right/Left: Left Pain - part of body: Knee     Time: 1458-1531 PT Time Calculation (min) (ACUTE ONLY): 33 min  Charges:  $Gait Training: 8-22 mins $Therapeutic Exercise: 23-37 mins                     Karma Ganja, PT, DPT   Acute Rehabilitation Department Pager #: [phone]   Otho Bellows 02/25/2020, 3:42 PM

## 2020-02-25 NOTE — Plan of Care (Signed)
  Problem: Pain Managment: Goal: General experience of comfort will improve Outcome: Progressing   Problem: Safety: Goal: Ability to remain free from injury will improve Outcome: Progressing   Problem: Skin Integrity: Goal: Risk for impaired skin integrity will decrease Outcome: Progressing   

## 2020-02-25 NOTE — Evaluation (Signed)
Physical Therapy Evaluation Patient Details Name: [name] MRN: [id number] DOB: 05/09/1933 Today's Date: 02/25/2020   History of Present Illness  Pt is an 86 yo female presenting s/p L TKA 5/19 with removal of hardware from prior tibial plateau fx. PMH includes: bilateral THA (L: 03/2017, R: 08/2019), chronic renal disease II, HTN, obesity, and R knee OA.  Clinical Impression  Pt in bed upon arrival of PT, agreeable to evaluation at this time. Prior to admission the pt was independent with mobility and ADLs, living alone, but with two kids in the area who can stay with her as needed following d/c. The pt now presents with limitations in functional mobility, strength, endurance, and stabiltiy due to above dx, and will continue to benefit from skilled PT to address these deficits. The pt was able to complete good initial transfers with minA and RW for improved safety as well as short ambulation in room with RW and minG. The pt did well with mobility given it was her first time out of bed, and will continue to progress well with skilled PT to improve functional strength, ROM, and endurance prior to d/c home.      Follow Up Recommendations Follow surgeon's recommendation for DC plan and follow-up therapies;Supervision/Assistance - 24 hour    Equipment Recommendations  None recommended by PT    Recommendations for Other Services       Precautions / Restrictions Precautions Precautions: Fall;Knee Precaution Booklet Issued: No Precaution Comments: discussed verbally Restrictions Weight Bearing Restrictions: Yes LLE Weight Bearing: Weight bearing as tolerated      Mobility  Bed Mobility Overal bed mobility: Needs Assistance Bed Mobility: Supine to Sit     Supine to sit: Min guard     General bed mobility comments: minG to come to EOB, pt able to mobilize LLE, but heavy use of bed rail  Transfers Overall transfer level: Needs assistance Equipment used: Rolling walker (2  wheeled) Transfers: Sit to/from Stand Sit to Stand: Min assist         General transfer comment: minA to stand from bed, with VC for hand placement. able to complete stand-> sit in recliner with minA to control decent  Ambulation/Gait Ambulation/Gait assistance: Min guard Gait Distance (Feet): 10 Feet Assistive device: Rolling walker (2 wheeled) Gait Pattern/deviations: Step-to pattern;Decreased stance time - left;Decreased weight shift to left;Shuffle;Decreased dorsiflexion - left Gait velocity: decreased Gait velocity interpretation: <1.31 ft/sec, indicative of household ambulator General Gait Details: pt with small steps in room with antalgic gait andreduced wb on L LE. Pt able to demo good turns and use of RW, no LOB  Stairs            Wheelchair Mobility    Modified Rankin (Stroke Patients Only)       Balance Overall balance assessment: Mild deficits observed, not formally tested                                           Pertinent Vitals/Pain Pain Assessment: Faces Faces Pain Scale: Hurts a little bit Pain Location: L knee (surgical site) Pain Descriptors / Indicators: Grimacing;Sore Pain Intervention(s): Limited activity within patient's tolerance;Monitored during session;Repositioned    Home Living Family/patient expects to be discharged to:: Private residence Living Arrangements: Alone Available Help at Discharge: Family;Available PRN/intermittently(daughter works during the day, can check on her mom. pt's kids can stay with her over weekend)  Type of Home: House Home Access: Ramped entrance     Home Layout: Two level;Laundry or work area in basement;Able to live on main level with bedroom/bathroom Home Equipment: Environmental consultant - 4 wheels;Walker - 2 wheels;Cane - quad;Cane - single point;Shower seat;Grab bars - tub/shower;Grab bars - toilet Additional Comments: pt reports she would like seat riser.    Prior Function Level of Independence:  Independent with assistive device(s)         Comments: pt reports use of "whatever is nearby" but mostly uses rollator and SPC. general furnture walking at times.     Hand Dominance   Dominant Hand: Right    Extremity/Trunk Assessment   Upper Extremity Assessment Upper Extremity Assessment: Overall WFL for tasks assessed    Lower Extremity Assessment Lower Extremity Assessment: Overall WFL for tasks assessed;LLE deficits/detail LLE Deficits / Details: s/p TKA LLE: Unable to fully assess due to pain    Cervical / Trunk Assessment Cervical / Trunk Assessment: Normal  Communication   Communication: No difficulties  Cognition Arousal/Alertness: Awake/alert Behavior During Therapy: WFL for tasks assessed/performed Overall Cognitive Status: Within Functional Limits for tasks assessed                                 General Comments: pt plesant and agreeable to PT assist, good concern for safety      General Comments General comments (skin integrity, edema, etc.): pt noted to have brief on upon standing.    Exercises     Assessment/Plan    PT Assessment Patient needs continued PT services  PT Problem List Decreased strength;Decreased mobility;Decreased range of motion;Decreased coordination;Decreased activity tolerance;Decreased balance       PT Treatment Interventions DME instruction;Therapeutic exercise;Gait training;Stair training;Functional mobility training;Therapeutic activities;Patient/family education;Cognitive remediation;Balance training    PT Goals (Current goals can be found in the Care Plan section)  Acute Rehab PT Goals Patient Stated Goal: return home PT Goal Formulation: With patient Time For Goal Achievement: 03/10/20 Potential to Achieve Goals: Good    Frequency 7X/week   Barriers to discharge        Co-evaluation               AM-PAC PT "6 Clicks" Mobility  Outcome Measure Help needed turning from your back to your  side while in a flat bed without using bedrails?: A Little Help needed moving from lying on your back to sitting on the side of a flat bed without using bedrails?: A Little Help needed moving to and from a bed to a chair (including a wheelchair)?: A Little Help needed standing up from a chair using your arms (e.g., wheelchair or bedside chair)?: A Little Help needed to walk in hospital room?: A Little Help needed climbing 3-5 steps with a railing? : A Lot 6 Click Score: 17    End of Session Equipment Utilized During Treatment: Gait belt Activity Tolerance: Patient tolerated treatment well Patient left: in chair;with call bell/phone within reach Nurse Communication: Mobility status PT Visit Diagnosis: Difficulty in walking, not elsewhere classified (R26.2);Muscle weakness (generalized) (M62.81);Pain Pain - Right/Left: Left Pain - part of body: Knee    Time: 1208-1240 PT Time Calculation (min) (ACUTE ONLY): 32 min   Charges:   PT Evaluation $PT Eval Moderate Complexity: 1 Mod PT Treatments $Gait Training: 8-22 mins        Karma Ganja, PT, DPT   Acute Rehabilitation Department Pager #: [phone]  Otho Bellows 02/25/2020, 2:50 PM

## 2020-02-25 NOTE — TOC Initial Note (Signed)
Transition of Care [facility]) - Initial/Assessment Note    Patient Details  Name: [name] MRN: [id number] Date of Birth: 1933-05-09  [facility] CM/SW Contact:    Curlene Labrum, RN Phone Number: 02/25/2020, 4:24 PM  Clinical Narrative:                 Patient is Right total knee arthroplasty from 02/24/20 by Dr. Lorin Mercy.  Patient lives by herself but her daughter is coming to live with her after discharge from the hospital.  Patient currently has a rolling walker and 3:1 at home but she says that the 3:1 is too large to fit around her toilet area.  Patient was given choice regarding home health and dme - [facility] called and waiting to receive call back to confirm PT therapy.  Also, called [employer] and spoke to [facility] and he mentioned that the [employer] store at [facility] has a different type of smaller toilet riser with handles that the patient's daughter can purchase and pick up from the [employer] store for 35.00.  This information was given to the patient to give to her daughter in case she would like to purchase one for the patient - they are not able to deliver this equipment to the room - Medicare prefers and covers the one she currently already has.  Will continue to follow for TOC needs.  Expected Discharge Plan: [place] Barriers to Discharge: No Barriers Identified   Patient Goals and CMS Choice Patient states their goals for this hospitalization and ongoing recovery are:: I'm ready to go home tomorrow. CMS Medicare.gov Compare Post Acute Care list provided to:: Patient Choice offered to / list presented to : Patient  Expected Discharge Plan and Services Expected Discharge Plan: [place]   Discharge Planning Services: CM Consult Post Acute Care Choice: Durable Medical Equipment(patient needs different type of toilet riser) Living arrangements for the past 2 months: Single Family Home                 DME Arranged:  (patient currently has Rolling walker and large 3:1 (too large to fit over toilet) at home) DME Agency: [employer] Date DME Agency Contacted: 02/25/20 Time DME Agency Contacted: 1622 Representative spoke with at DME Agency: Edwyna Ready, [place]: PT [place] Agency: [facility] (formerly [employer]) Date [place]: 02/25/20 Time [place]: 1623 Representative spoke with at [place]: Joen Laura, Nettie  Prior Living Arrangements/Services Living arrangements for the past 2 months: [place] Lives with:: Relatives Patient language and need for interpreter reviewed:: Yes Do you feel safe going back to the place where you live?: Yes      Need for Family Participation in Patient Care: Yes (Comment) Care giver support system in place?: Yes (comment)   Criminal Activity/Legal Involvement Pertinent to Current Situation/Hospitalization: No - Comment as needed  Activities of Daily Living Home Assistive Devices/Equipment: Cane (specify quad or straight), Dentures (specify type), Eyeglasses ADL Screening (condition at time of admission) Patient's cognitive ability adequate to safely complete daily activities?: Yes Is the patient deaf or have difficulty hearing?: No Does the patient have difficulty seeing, even when wearing glasses/contacts?: No Does the patient have difficulty concentrating, remembering, or making decisions?: No Patient able to express need for assistance with ADLs?: Yes Does the patient have difficulty dressing or bathing?: No Independently performs ADLs?: Yes (appropriate for developmental age) Does the patient have  difficulty walking or climbing stairs?: No Weakness of Legs: Left Weakness of Arms/Hands: None  Permission Sought/Granted Permission sought to share information with : Case Manager Permission granted to share information with : Yes, Verbal Permission Granted     Permission granted to share info w AGENCY: [facility] granted to share info w Relationship: daughter     Emotional Assessment Appearance:: Appears stated age Attitude/Demeanor/Rapport: Gracious Affect (typically observed): Accepting Orientation: : Oriented to Self, Oriented to Place, Oriented to  Time, Oriented to Situation Alcohol / Substance Use: Not Applicable Psych Involvement: No (comment)  Admission diagnosis:  Arthritis of left knee [M17.12] Patient Active Problem List   Diagnosis Date Noted  . Arthritis of left knee 02/24/2020  . History of total hip arthroplasty, right 08/21/2019  . Intermittent claudication ([place]) 07/08/2019  . Hypertensive nephropathy 12/15/2018  . Chronic renal disease, stage II 12/15/2018  . H/O total hip arthroplasty, left 11/07/2017  . Post-traumatic osteoarthritis of left knee 11/07/2017  . Unilateral primary osteoarthritis, right knee 11/07/2017  . Presence of left artificial hip joint 05/14/2017  . Preop cardiovascular exam 03/12/2017  . Cough   . Abdominal pain   . Symptomatic cholelithiasis 07/01/2016  . Nonspecific ST-T wave electrocardiographic changes   . Lower leg edema 02/16/2015  . Obesity (BMI 30.0-34.9) 02/16/2015  . Essential hypertension 01/11/2012  . Hyperthyroidism 01/11/2012  . Hypercholesteremia 01/11/2012  . H/O vaginal hysterectomy 1974 01/11/2012    Class: History of  . S/P removal of lung  partial R lobe  2010 01/11/2012  . History of hernia repair  R ing. 1975 01/11/2012  . BACK PAIN 11/12/2007  . DYSPNEA 11/11/2007   PCP:  Glendale Chard, MD Pharmacy:   [facility] [address] [place]), [place] [id number] [place] ([place]) [place] [address] Phone: [phone] Fax: [phone]  [facility] [place], [place] [address] Phone: [phone] Fax: [phone]  [facility] [place], [place] [address] [place] [address] Phone: [phone] Fax: [phone]     Social Determinants of Health (SDOH) Interventions    Readmission Risk Interventions Readmission Risk Prevention Plan 02/25/2020  Post Dischage Appt Complete  Medication Screening Complete  Transportation Screening Complete  Some recent data might be hidden

## 2020-02-26 LAB — CBC
HCT: 26.8 % — ABNORMAL LOW (ref 36.0–46.0)
Hemoglobin: 9.3 g/dL — ABNORMAL LOW (ref 12.0–15.0)
MCH: 28.2 pg (ref 26.0–34.0)
MCHC: 34.7 g/dL (ref 30.0–36.0)
MCV: 81.2 fL (ref 80.0–100.0)
Platelets: 264 10*3/uL (ref 150–400)
RBC: 3.3 MIL/uL — ABNORMAL LOW (ref 3.87–5.11)
RDW: 14 % (ref 11.5–15.5)
WBC: 6.3 10*3/uL (ref 4.0–10.5)
nRBC: 0 % (ref 0.0–0.2)

## 2020-02-26 MED ORDER — HYDROCODONE-ACETAMINOPHEN 7.5-325 MG PO TABS
1.0000 | ORAL_TABLET | ORAL | Status: DC | PRN
  Administered 2020-02-26: 2 via ORAL
  Filled 2020-02-26: qty 2

## 2020-02-26 MED ORDER — HYDROCODONE-ACETAMINOPHEN 7.5-325 MG PO TABS: 1.0000 | ORAL_TABLET | ORAL | 0 refills | Status: DC | PRN

## 2020-02-26 MED ORDER — ASPIRIN 325 MG PO TABS
325.0000 mg | ORAL_TABLET | Freq: Every day | ORAL | Status: DC
Start: 2020-02-26 — End: 2020-06-28

## 2020-02-26 MED ORDER — METHOCARBAMOL 500 MG PO TABS: 500.0000 mg | ORAL_TABLET | Freq: Four times a day (QID) | ORAL | 0 refills | Status: DC | PRN

## 2020-02-26 NOTE — Plan of Care (Signed)
  Problem: Education: Goal: Knowledge of General Education information will improve Description: Including pain rating scale, medication(s)/side effects and non-pharmacologic comfort measures Outcome: Progressing   Problem: Health Behavior/Discharge Planning: Goal: Ability to manage health-related needs will improve Outcome: Progressing  Plan of care for pain control, work with PT and then possible discharge reviewed.  Problem: Activity: Goal: Risk for activity intolerance will decrease Outcome: Progressing  Patient up in hall, transferring to bathroom, denies SOB.  Problem: Nutrition: Goal: Adequate nutrition will be maintained Outcome: Progressing  Patient denies nausea, tolerates pain.

## 2020-02-26 NOTE — Discharge Instructions (Signed)
Dental Antibiotics:  In most cases prophylactic antibiotics for Dental procdeures after total joint surgery are not necessary.  Exceptions are as follows:  1. History of prior total joint infection  2. Severely immunocompromised (Organ Transplant, cancer chemotherapy, Rheumatoid biologic meds such as [place])  3. Poorly controlled diabetes (A1C &gt; 8.0, blood glucose over 200)  If you have one of these conditions, contact your surgeon for an antibiotic prescription, prior to your dental procedure. INSTRUCTIONS AFTER JOINT REPLACEMENT   o Remove items at home which could result in a fall. This includes throw rugs or furniture in walking pathways o ICE to the affected joint every three hours while awake for 30 minutes at a time, for at least the first 3-5 days, and then as needed for pain and swelling.  Continue to use ice for pain and swelling. You may notice swelling that will progress down to the foot and ankle.  This is normal after surgery.  Elevate your leg when you are not up walking on it.   o Continue to use the breathing machine you got in the hospital (incentive spirometer) which will help keep your temperature down.  It is common for your temperature to cycle up and down following surgery, especially at night when you are not up moving around and exerting yourself.  The breathing machine keeps your lungs expanded and your temperature down.   DIET:  As you were doing prior to hospitalization, we recommend a well-balanced diet.  DRESSING / WOUND CARE / SHOWERING  Keep the surgical dressing until follow up.  The dressing is water proof, so you can shower without any extra covering.  IF THE DRESSING FALLS OFF or the wound gets wet inside, change the dressing with sterile gauze.  Please use good hand washing techniques before changing the dressing.  Do not use any lotions or creams on the incision until instructed by your surgeon.    ACTIVITY  o Increase activity slowly as  tolerated, but follow the weight bearing instructions below.   o No driving for 6 weeks or until further direction given by your physician.  You cannot drive while taking narcotics.  o No lifting or carrying greater than 10 lbs. until further directed by your surgeon. o Avoid periods of inactivity such as sitting longer than an hour when not asleep. This helps prevent blood clots.  o You may return to work once you are authorized by your doctor.     WEIGHT BEARING   Weight bearing as tolerated with assist device (walker, cane, etc) as directed, use it as long as suggested by your surgeon or therapist, typically at least 4-6 weeks.   EXERCISES  Results after joint replacement surgery are often greatly improved when you follow the exercise, range of motion and muscle strengthening exercises prescribed by your doctor. Safety measures are also important to protect the joint from further injury. Any time any of these exercises cause you to have increased pain or swelling, decrease what you are doing until you are comfortable again and then slowly increase them. If you have problems or questions, call your caregiver or physical therapist for advice.   Rehabilitation is important following a joint replacement. After just a few days of immobilization, the muscles of the leg can become weakened and shrink (atrophy).  These exercises are designed to build up the tone and strength of the thigh and leg muscles and to improve motion. Often times heat used for twenty to thirty minutes before working out  will loosen up your tissues and help with improving the range of motion but do not use heat for the first two weeks following surgery (sometimes heat can increase post-operative swelling).   These exercises can be done on a training (exercise) mat, on the floor, on a table or on a bed. Use whatever works the best and is most comfortable for you.    Use music or television while you are exercising so that the  exercises are a pleasant break in your day. This will make your life better with the exercises acting as a break in your routine that you can look forward to.   Perform all exercises about fifteen times, three times per day or as directed.  You should exercise both the operative leg and the other leg as well.  Exercises include:    Quad Sets - Tighten up the muscle on the front of the thigh (Quad) and hold for 5-10 seconds.    Straight Leg Raises - With your knee straight (if you were given a brace, keep it on), lift the leg to 60 degrees, hold for 3 seconds, and slowly lower the leg.  Perform this exercise against resistance later as your leg gets stronger.   Leg Slides: Lying on your back, slowly slide your foot toward your buttocks, bending your knee up off the floor (only go as far as is comfortable). Then slowly slide your foot back down until your leg is flat on the floor again.   Angel Wings: Lying on your back spread your legs to the side as far apart as you can without causing discomfort.   Hamstring Strength:  Lying on your back, push your heel against the floor with your leg straight by tightening up the muscles of your buttocks.  Repeat, but this time bend your knee to a comfortable angle, and push your heel against the floor.  You may put a pillow under the heel to make it more comfortable if necessary.   A rehabilitation program following joint replacement surgery can speed recovery and prevent re-injury in the future due to weakened muscles. Contact your doctor or a physical therapist for more information on knee rehabilitation.    CONSTIPATION  Constipation is defined medically as fewer than three stools per week and severe constipation as less than one stool per week.  Even if you have a regular bowel pattern at home, your normal regimen is likely to be disrupted due to multiple reasons following surgery.  Combination of anesthesia, postoperative narcotics, change in appetite and  fluid intake all can affect your bowels.   YOU MUST use at least one of the following options; they are listed in order of increasing strength to get the job done.  They are all available over the counter, and you may need to use some, POSSIBLY even all of these options:    Drink plenty of fluids (prune juice may be helpful) and high fiber foods Colace 100 mg by mouth twice a day  Senokot for constipation as directed and as needed Dulcolax (bisacodyl), take with full glass of water  Miralax (polyethylene glycol) once or twice a day as needed.  If you have tried all these things and are unable to have a bowel movement in the first 3-4 days after surgery call either your surgeon or your primary doctor.    If you experience loose stools or diarrhea, hold the medications until you stool forms back up.  If your symptoms do not get better  within 1 week or if they get worse, check with your doctor.  If you experience "the worst abdominal pain ever" or develop nausea or vomiting, please contact the office immediately for further recommendations for treatment.   ITCHING:  If you experience itching with your medications, try taking only a single pain pill, or even half a pain pill at a time.  You can also use Benadryl over the counter for itching or also to help with sleep.   TED HOSE STOCKINGS:  Use stockings on both legs until for at least 2 weeks or as directed by physician office. They may be removed at night for sleeping.  MEDICATIONS:  See your medication summary on the After Visit Summary that nursing will review with you.  You may have some home medications which will be placed on hold until you complete the course of blood thinner medication.  It is important for you to complete the blood thinner medication as prescribed.  PRECAUTIONS:  If you experience chest pain or shortness of breath - call 911 immediately for transfer to the hospital emergency department.   If you develop a fever greater  that 101 F, purulent drainage from wound, increased redness or drainage from wound, foul odor from the wound/dressing, or calf pain - CONTACT YOUR SURGEON.                                                   FOLLOW-UP APPOINTMENTS:  If you do not already have a post-op appointment, please call the office for an appointment to be seen by your surgeon.  Guidelines for how soon to be seen are listed in your After Visit Summary, but are typically between 1-4 weeks after surgery.  OTHER INSTRUCTIONS:   Knee Replacement:  Do not place pillow under knee, focus on keeping the knee straight while resting. CPM instructions: 0-90 degrees, 2 hours in the morning, 2 hours in the afternoon, and 2 hours in the evening. Place foam block, curve side up under heel at all times except when in CPM or when walking.  DO NOT modify, tear, cut, or change the foam block in any way.   DENTAL ANTIBIOTICS:  In most cases prophylactic antibiotics for Dental procdeures after total joint surgery are not necessary.  Exceptions are as follows:  1. History of prior total joint infection  2. Severely immunocompromised (Organ Transplant, cancer chemotherapy, Rheumatoid biologic meds such as [place])  3. Poorly controlled diabetes (A1C &gt; 8.0, blood glucose over 200)  If you have one of these conditions, contact your surgeon for an antibiotic prescription, prior to your dental procedure.   MAKE SURE YOU:   Understand these instructions.   Get help right away if you are not doing well or get worse.    Thank you for letting us be a part of your medical care team.  It is a privilege we respect greatly.  We hope these instructions will help you stay on track for a fast and full recovery!

## 2020-02-26 NOTE — Progress Notes (Signed)
RN called to room, patient crying, stating she is in 10/10 pain and no one has offered her pain medication in a long time.  Patient stating CPM machine hurting her, cpm machine stopped, Agricultural consultant notified. Norco given, patient then asked to be toileted.

## 2020-02-26 NOTE — Plan of Care (Signed)
  Problem: Education: Goal: Knowledge of General Education information will improve Description: Including pain rating scale, medication(s)/side effects and non-pharmacologic comfort measures 02/26/2020 1614 by Joni Reining, RN Outcome: Adequate for Discharge 02/26/2020 1144 by Joni Reining, RN Outcome: Progressing   Problem: Health Behavior/Discharge Planning: Goal: Ability to manage health-related needs will improve 02/26/2020 1614 by Joni Reining, RN Outcome: Adequate for Discharge 02/26/2020 1144 by Joni Reining, RN Outcome: Progressing   Problem: Clinical Measurements: Goal: Ability to maintain clinical measurements within normal limits will improve Outcome: Adequate for Discharge Goal: Will remain free from infection Outcome: Adequate for Discharge Goal: Diagnostic test results will improve Outcome: Adequate for Discharge Goal: Respiratory complications will improve Outcome: Adequate for Discharge Goal: Cardiovascular complication will be avoided Outcome: Adequate for Discharge   Problem: Activity: Goal: Risk for activity intolerance will decrease 02/26/2020 1614 by Joni Reining, RN Outcome: Adequate for Discharge 02/26/2020 1144 by Joni Reining, RN Outcome: Progressing   Problem: Nutrition: Goal: Adequate nutrition will be maintained 02/26/2020 1614 by Joni Reining, RN Outcome: Adequate for Discharge 02/26/2020 1144 by Joni Reining, RN Outcome: Progressing   Problem: Coping: Goal: Level of anxiety will decrease Outcome: Adequate for Discharge   Problem: Elimination: Goal: Will not experience complications related to bowel motility Outcome: Adequate for Discharge Goal: Will not experience complications related to urinary retention Outcome: Adequate for Discharge   Problem: Pain Managment: Goal: General experience of comfort will improve Outcome: Adequate for Discharge   Problem: Safety: Goal: Ability to remain free from injury will  improve Outcome: Adequate for Discharge   Problem: Skin Integrity: Goal: Risk for impaired skin integrity will decrease Outcome: Adequate for Discharge

## 2020-02-26 NOTE — Progress Notes (Signed)
Nsg Discharge Note  Admit Date:  02/24/2020 Discharge date: 02/26/2020   Burgess Amor to be D/C'd Home per MD order.  AVS completed.   Patient/caregiver able to verbalize understanding.  Discharge Medication: Allergies as of 02/26/2020      Reactions   Ibuprofen Other (See Comments)   hallucinations   Naproxen Sodium Other (See Comments)   hallucinations      Medication List    STOP taking these medications   diclofenac Sodium 1 % Gel Commonly known as: VOLTAREN   Tylenol 8 Hour 650 MG CR tablet Generic drug: acetaminophen     TAKE these medications   Ascorbic Acid 500 MG Chew Chew 500 mg by mouth daily. Notes to patient: Tomorrow, 02/27/20.   aspirin 325 MG tablet Commonly known as: Bayer Aspirin Take 1 tablet (325 mg total) by mouth daily. Take one daily for 4 wks with food to decrease blood clot risks. Notes to patient: Tomorrow, 02/27/20.   busPIRone 5 MG tablet Commonly known as: BUSPAR Take 1 tablet (5 mg total) by mouth 2 (two) times daily. What changed:   when to take this  reasons to take this Notes to patient: Tonight, 02/26/20.   Calcium 600 600 MG Tabs tablet Generic drug: calcium carbonate Take 600 mg by mouth daily. Notes to patient: Tomorrow, 02/27/20.   cholecalciferol 25 MCG (1000 UNIT) tablet Commonly known as: VITAMIN D3 Take 1,000 Units by mouth daily. Notes to patient: Tomorrow, 02/27/20.   Flaxseed Oil 1000 MG Caps Take 1,000 mg by mouth daily. Notes to patient: Tomorrow, 02/27/20.   furosemide 20 MG tablet Commonly known as: LASIX Take 20 mg by mouth daily. Notes to patient: Tomorrow, 02/27/20.   HYDROcodone-acetaminophen 7.5-325 MG tablet Commonly known as: NORCO Take 1-2 tablets by mouth every 4 (four) hours as needed for severe pain (pain score 7-10). Post op pain   methimazole 5 MG tablet Commonly known as: TAPAZOLE Take 2.5 mg by mouth daily. Notes to patient: Tomorrow, 02/27/20.   methocarbamol 500 MG tablet Commonly  known as: ROBAXIN Take 1 tablet (500 mg total) by mouth every 6 (six) hours as needed for muscle spasms.   metoprolol succinate 25 MG 24 hr tablet Commonly known as: Toprol XL Take 1 tablet (25 mg total) by mouth daily. Notes to patient: Tomorrow, 02/27/20.   nystatin cream Commonly known as: MYCOSTATIN APPLY CREAM TO AFFECTED AREA TWICE A DAY AS NEEDED What changed: See the new instructions.   potassium chloride SA 20 MEQ tablet Commonly known as: KLOR-CON Take 1 tablet (20 mEq total) by mouth daily. Notes to patient: Tomorrow, 02/27/20.   sulfamethoxazole-trimethoprim 800-160 MG tablet Commonly known as: BACTRIM DS 1 po bid x 5 days Notes to patient: Tomorrow, 02/27/20.   Tricor 145 MG tablet Generic drug: fenofibrate Take 1 tablet (145 mg total) by mouth daily. Notes to patient: Tomorrow, 02/27/20.       Discharge Assessment: Vitals:   02/26/20 0719 02/26/20 1434  BP: 129/65 115/65  Pulse: 79 69  Resp: 16 14  Temp: 98.7 F (37.1 C) 98.6 F (37 C)  SpO2: 95% 98%   Skin clean, dry and intact without evidence of skin break down, no evidence of skin tears noted. IV catheter discontinued intact. Site without signs and symptoms of complications - no redness or edema noted at insertion site, patient denies c/o pain - only slight tenderness at site.  Dressing with slight pressure applied.  D/c Instructions-Education: Discharge instructions given to patient/family with verbalized understanding.  D/c education completed with patient/family including follow up instructions, medication list, d/c activities limitations if indicated, with other d/c instructions as indicated by MD - patient able to verbalize understanding, all questions fully answered. Patient instructed to return to ED, call 911, or call MD for any changes in condition.  Patient escorted via [place], and D/C home via private auto.  Joni Reining, RN 02/26/2020 4:47 PM

## 2020-02-26 NOTE — Progress Notes (Signed)
   Subjective: 2 Days Post-Op Procedure(s) (LRB): LEFT TOTAL KNEE ARTHROPLASTY (Left) RIGHT KNEE STEROID INTRA-ARTICULAR MARCAINE/DEPO-MEDROL INJECTION UNDER ANESTHESIA (Right) Hardware Removal, left knee (Left) Patient reports pain as moderate and severe.    Objective: Vital signs in last 24 hours: Temp:  [98.5 F (36.9 C)-99.2 F (37.3 C)] 98.7 F (37.1 C) (05/21 0719) Pulse Rate:  [71-79] 79 (05/21 0719) Resp:  [16-19] 16 (05/21 0719) BP: (105-129)/(52-65) 129/65 (05/21 0719) SpO2:  [93 %-97 %] 95 % (05/21 0719)  Intake/Output from previous day: 05/20 0701 - 05/21 0700 In: -  Out: 700 [Urine:700] Intake/Output this shift: No intake/output data recorded.  Recent Labs    02/25/20 0228 02/26/20 0535  HGB 10.2* 9.3*   Recent Labs    02/25/20 0228 02/26/20 0535  WBC 9.2 6.3  RBC 3.57* 3.30*  HCT 29.1* 26.8*  PLT 279 264   Recent Labs    02/25/20 0228  NA 135  K 4.7  CL 106  CO2 21*  BUN 12  CREATININE 0.82  GLUCOSE 167*  CALCIUM 8.4*   No results for input(s): LABPT, INR in the last 72 hours.  Neurologically intact. Dressing changed. Some medial bruising. Calf soft.  No results found.  Assessment/Plan: 2 Days Post-Op Procedure(s) (LRB): LEFT TOTAL KNEE ARTHROPLASTY (Left) RIGHT KNEE STEROID INTRA-ARTICULAR MARCAINE/DEPO-MEDROL INJECTION UNDER ANESTHESIA (Right) Hardware Removal, left knee (Left) Up with therapy, discharge after she works with PT again today. HHPT 3X per week  [name] 02/26/2020, 10:37 AM

## 2020-02-26 NOTE — Progress Notes (Signed)
Physical Therapy Treatment Patient Details Name: [name] MRN: [id number] DOB: [birth year] Today's Date: 02/26/2020    History of Present Illness Pt is an 86 yo female presenting s/p L TKA 5/19 with removal of hardware from prior tibial plateau fx. PMH includes: bilateral THA (L: 03/2017, R: 08/2019), chronic renal disease II, HTN, obesity, and R knee OA.    PT Comments    Pt in bed upon arrival of PT, agreeable to additional session this afternoon with focus on progressing functional ROM and strengthening through exercises. The pt was able to complete multiple LE exercises with good technique and minimal increase in pain, but continues to have reduced ROM with LLE and need assist to move through the ROM despite obvious initiation of movement. The pt was also able to complete bed mobility without assist, but requires significantly increased time, and continues to need VC for hand positioning for sit-stand and stand-pivot transfers. The pt will continue to benefit from skilled PT to further progress functional strength, ROM, endurance, and stability to improve functional safety and independence with movement following d/c.     Follow Up Recommendations  Follow surgeon's recommendation for DC plan and follow-up therapies;Supervision/Assistance - 24 hour     Equipment Recommendations  None recommended by PT    Recommendations for Other Services       Precautions / Restrictions Precautions Precautions: Fall;Knee Precaution Booklet Issued: Yes (comment) Precaution Comments: discussed verbally Restrictions Weight Bearing Restrictions: Yes LLE Weight Bearing: Weight bearing as tolerated    Mobility  Bed Mobility Overal bed mobility: Needs Assistance Bed Mobility: Supine to Sit     Supine to sit: Min guard     General bed mobility comments: pt prefers to receive assist to LLE, able to complete transition to EOB with significantly increased time, uses BUE to mobilize LLE until  cued to use LLE muscles to move her leg  Transfers Overall transfer level: Needs assistance Equipment used: Rolling walker (2 wheeled) Transfers: Sit to/from [facility] Sit to Stand: Min assist Stand pivot transfers: Min assist       General transfer comment: pt able to stand from EOB with minA and RW, continues to need VC for hand placement. minA to rise from [facility] but minG to pivot/step back to bed  Ambulation/Gait Ambulation/Gait assistance: Min guard Gait Distance (Feet): 50 Feet Assistive device: Rolling walker (2 wheeled) Gait Pattern/deviations: Step-to pattern;Decreased stance time - left;Decreased weight shift to left;Shuffle;Decreased dorsiflexion - left;Antalgic Gait velocity: 0.13 m/s Gait velocity interpretation: <1.31 ft/sec, indicative of household ambulator General Gait Details: pt with small steps with antalgic gait and reduced wt acceptance on LLE, good use of RW, no LOB. HR in 80s   Stairs             Wheelchair Mobility    Modified Rankin (Stroke Patients Only)       Balance Overall balance assessment: Mild deficits observed, not formally tested                                          Cognition Arousal/Alertness: Awake/alert Behavior During Therapy: WFL for tasks assessed/performed Overall Cognitive Status: Within Functional Limits for tasks assessed                                 General Comments: pt encouraged to  treat all mobility as therapy as she has been asking for bedpan with RN staff, even asked PT for bedpan depsite being capable of ambulating and using BSC or bathroom. The pt voiced understanding and agreement.      Exercises Total Joint Exercises Ankle Circles/Pumps: AROM;Both;15 reps;Supine Quad Sets: AROM;Both;10 reps;Supine Heel Slides: AROM;AAROM;Both;10 reps;Supine(AROM RLE, AAROM LLE) Hip ABduction/ADduction: AROM;10 reps;Supine;AAROM;Both(AROM RLE, AAROM LLE) Knee Flexion:  AAROM;Left;10 reps;Seated    General Comments General comments (skin integrity, edema, etc.): VSS on RA      Pertinent Vitals/Pain Pain Assessment: Faces Faces Pain Scale: Hurts little more Pain Location: L knee (surgical site) Pain Descriptors / Indicators: Grimacing;Sore Pain Intervention(s): Limited activity within patient's tolerance;Monitored during session;Repositioned;RN gave pain meds during session    Home Living                      Prior Function            PT Goals (current goals can now be found in the care plan section) Acute Rehab PT Goals Patient Stated Goal: return home PT Goal Formulation: With patient Time For Goal Achievement: 03/10/20 Potential to Achieve Goals: Good Progress towards PT goals: Progressing toward goals    Frequency    7X/week      PT Plan Current plan remains appropriate    Co-evaluation              AM-PAC PT "6 Clicks" Mobility   Outcome Measure  Help needed turning from your back to your side while in a flat bed without using bedrails?: A Little Help needed moving from lying on your back to sitting on the side of a flat bed without using bedrails?: A Little Help needed moving to and from a bed to a chair (including a wheelchair)?: A Little Help needed standing up from a chair using your arms (e.g., wheelchair or bedside chair)?: A Little Help needed to walk in hospital room?: A Little Help needed climbing 3-5 steps with a railing? : A Lot 6 Click Score: 17    End of Session Equipment Utilized During Treatment: Gait belt Activity Tolerance: Patient tolerated treatment well Patient left: in bed;with bed alarm set;with call bell/phone within reach Nurse Communication: Mobility status PT Visit Diagnosis: Difficulty in walking, not elsewhere classified (R26.2);Muscle weakness (generalized) (M62.81);Pain Pain - Right/Left: Left Pain - part of body: Knee     Time: 1209-1236 PT Time Calculation (min) (ACUTE  ONLY): 27 min  Charges:  $Gait Training: 8-22 mins $Therapeutic Exercise: 8-22 mins $Therapeutic Activity: 8-22 mins                     Karma Ganja, PT, DPT   Acute Rehabilitation Department Pager #: [phone]   Otho Bellows 02/26/2020, 1:47 PM

## 2020-02-26 NOTE — Progress Notes (Signed)
Physical Therapy Treatment Patient Details Name: [name] MRN: [id number] DOB: 1933/02/22 Today's Date: 02/26/2020    History of Present Illness Pt is an 86 yo female presenting s/p L TKA 5/19 with removal of hardware from prior tibial plateau fx. PMH includes: bilateral THA (L: 03/2017, R: 08/2019), chronic renal disease II, HTN, obesity, and R knee OA.    PT Comments    Pt in bed upon arrival of PT, agreeable to PT session with focus on progressing ambulation distance to facilitate anticipated d/c home. The pt was able to demo significant improvement in ambulation distance and did so with good use of RW and good stability. However, the pt continues to present with deficits in functional ROM, strength, power, and endurance, that limit her ability to safely complete bed mobility and transfers without assistance. The pt will therefore continue to benefit from skilled PT to address these deficits, to facilitate return to prior independence and functional mobility following d/c.     Follow Up Recommendations  Follow surgeon's recommendation for DC plan and follow-up therapies;Supervision/Assistance - 24 hour     Equipment Recommendations  None recommended by PT    Recommendations for Other Services       Precautions / Restrictions Precautions Precautions: Fall;Knee Precaution Booklet Issued: Yes (comment) Precaution Comments: discussed verbally Restrictions Weight Bearing Restrictions: Yes LLE Weight Bearing: Weight bearing as tolerated    Mobility  Bed Mobility Overal bed mobility: Needs Assistance Bed Mobility: Supine to Sit     Supine to sit: Min assist     General bed mobility comments: minG to come to EOB,minA to mobilize LLE, but heavy use of bed rail  Transfers Overall transfer level: Needs assistance Equipment used: Rolling walker (2 wheeled) Transfers: Sit to/from Stand Sit to Stand: Min assist         General transfer comment: pt able to stand from EOB  with minA and RW, continues to need VC for hand placement  Ambulation/Gait Ambulation/Gait assistance: Min guard Gait Distance (Feet): 50 Feet Assistive device: Rolling walker (2 wheeled) Gait Pattern/deviations: Step-to pattern;Decreased stance time - left;Decreased weight shift to left;Shuffle;Decreased dorsiflexion - left;Antalgic Gait velocity: 0.13 m/s Gait velocity interpretation: <1.31 ft/sec, indicative of household ambulator General Gait Details: pt with small steps with antalgic gait and reduced wt acceptance on LLE, good use of RW, no LOB. HR in 80s   Stairs             Wheelchair Mobility    Modified Rankin (Stroke Patients Only)       Balance Overall balance assessment: Mild deficits observed, not formally tested                                          Cognition Arousal/Alertness: Awake/alert Behavior During Therapy: WFL for tasks assessed/performed Overall Cognitive Status: Within Functional Limits for tasks assessed                                 General Comments: pt plesant and agreeable to PT assist, good concern for safety      Exercises      General Comments General comments (skin integrity, edema, etc.): VSS on RA      Pertinent Vitals/Pain Pain Assessment: Faces Faces Pain Scale: Hurts a little bit Pain Location: L knee (surgical site) Pain Intervention(s): Limited  activity within patient's tolerance;Monitored during session;Repositioned    Home Living                      Prior Function            PT Goals (current goals can now be found in the care plan section) Acute Rehab PT Goals Patient Stated Goal: return home PT Goal Formulation: With patient Time For Goal Achievement: 03/10/20 Potential to Achieve Goals: Good Progress towards PT goals: Progressing toward goals    Frequency    7X/week      PT Plan Current plan remains appropriate    Co-evaluation               AM-PAC PT "6 Clicks" Mobility   Outcome Measure  Help needed turning from your back to your side while in a flat bed without using bedrails?: A Little Help needed moving from lying on your back to sitting on the side of a flat bed without using bedrails?: A Little Help needed moving to and from a bed to a chair (including a wheelchair)?: A Little Help needed standing up from a chair using your arms (e.g., wheelchair or bedside chair)?: A Little Help needed to walk in hospital room?: A Little Help needed climbing 3-5 steps with a railing? : A Lot 6 Click Score: 17    End of Session Equipment Utilized During Treatment: Gait belt Activity Tolerance: Patient tolerated treatment well Patient left: in chair;with call bell/phone within reach Nurse Communication: Mobility status PT Visit Diagnosis: Difficulty in walking, not elsewhere classified (R26.2);Muscle weakness (generalized) (M62.81);Pain Pain - Right/Left: Left Pain - part of body: Knee     Time: [id number] PT Time Calculation (min) (ACUTE ONLY): 17 min  Charges:  $Gait Training: 8-22 mins                     Karma Ganja, PT, DPT   Acute Rehabilitation Department Pager #: [phone]   Otho Bellows 02/26/2020, 11:50 AM

## 2020-02-27 DIAGNOSIS — Z471 Aftercare following joint replacement surgery: Secondary | ICD-10-CM | POA: Diagnosis not present

## 2020-02-29 ENCOUNTER — Telehealth: Payer: Self-pay | Admitting: *Deleted

## 2020-02-29 NOTE — Telephone Encounter (Signed)
Ortho bundle D/C call; patient discharged on Friday, 02/26/20.

## 2020-03-02 ENCOUNTER — Telehealth: Payer: Self-pay | Admitting: *Deleted

## 2020-03-02 NOTE — Telephone Encounter (Signed)
7 day Ortho bundle call completed. 

## 2020-03-04 ENCOUNTER — Other Ambulatory Visit: Payer: Self-pay | Admitting: Surgical

## 2020-03-04 ENCOUNTER — Telehealth: Payer: Self-pay | Admitting: *Deleted

## 2020-03-04 MED ORDER — HYDROCODONE-ACETAMINOPHEN 7.5-325 MG PO TABS: 1.0000 | ORAL_TABLET | Freq: Four times a day (QID) | ORAL | 0 refills | Status: DC | PRN

## 2020-03-04 MED ORDER — METHOCARBAMOL 500 MG PO TABS: 500.0000 mg | ORAL_TABLET | Freq: Four times a day (QID) | ORAL | 0 refills | Status: DC | PRN

## 2020-03-04 NOTE — Telephone Encounter (Signed)
Received call from patient requesting refill of Hydrocodone and Robaxin. She is 9 days post-op. Patient of Dr. Lorin Mercy, who is out of office. Would you mind refilling for her? Thanks.

## 2020-03-04 NOTE — Telephone Encounter (Signed)
Sure thing, submitted

## 2020-03-09 ENCOUNTER — Telehealth: Payer: Self-pay | Admitting: *Deleted

## 2020-03-09 ENCOUNTER — Ambulatory Visit (INDEPENDENT_AMBULATORY_CARE_PROVIDER_SITE_OTHER): Payer: Medicare Other | Admitting: Orthopaedic Surgery

## 2020-03-09 ENCOUNTER — Encounter: Payer: Self-pay | Admitting: Orthopaedic Surgery

## 2020-03-09 ENCOUNTER — Other Ambulatory Visit: Payer: Self-pay

## 2020-03-09 DIAGNOSIS — Z96652 Presence of left artificial knee joint: Secondary | ICD-10-CM | POA: Insufficient documentation

## 2020-03-09 MED ORDER — HYDROCODONE-ACETAMINOPHEN 7.5-325 MG PO TABS: 1.0000 | ORAL_TABLET | Freq: Four times a day (QID) | ORAL | 0 refills | Status: DC | PRN

## 2020-03-09 NOTE — Telephone Encounter (Signed)
Ortho bundle call completed. 

## 2020-03-09 NOTE — Progress Notes (Signed)
Post left total knee arthroplasty.  Staples were removed today.  Norco 7.5/325 is renewed.  She is got full extension already and is starting outpatient therapy.  She is approaching 90 degrees flexion and did not have 90 degrees flexion preoperatively.

## 2020-03-10 ENCOUNTER — Ambulatory Visit (INDEPENDENT_AMBULATORY_CARE_PROVIDER_SITE_OTHER): Payer: Medicare Other | Admitting: Rehabilitative and Restorative Service Providers"

## 2020-03-10 DIAGNOSIS — R262 Difficulty in walking, not elsewhere classified: Secondary | ICD-10-CM | POA: Diagnosis not present

## 2020-03-10 DIAGNOSIS — R600 Localized edema: Secondary | ICD-10-CM

## 2020-03-10 DIAGNOSIS — M6281 Muscle weakness (generalized): Secondary | ICD-10-CM | POA: Diagnosis not present

## 2020-03-10 DIAGNOSIS — G8929 Other chronic pain: Secondary | ICD-10-CM

## 2020-03-10 DIAGNOSIS — M25562 Pain in left knee: Secondary | ICD-10-CM

## 2020-03-10 DIAGNOSIS — M25662 Stiffness of left knee, not elsewhere classified: Secondary | ICD-10-CM

## 2020-03-10 NOTE — Patient Instructions (Signed)
Access Code: [email or web address] URL: [email or web address] Date: 03/10/2020 Prepared by: Scot Jun  Exercises Small Range Straight Leg Raise - 2 x daily - 7 x weekly - 10 reps - 3 sets Supine Heel Slide - 2 x daily - 7 x weekly - 10 reps - 3 sets - 2 hold Seated Long Arc Quad - 2 x daily - 7 x weekly - 10 reps - 3 sets - 2 hold Supine Knee Extension Mobilization with Weight - 4 x daily - 7 x weekly - 1 reps - 1 sets - up to 15 mins hold

## 2020-03-10 NOTE — Therapy (Signed)
[place], [place], [address] Phone: [phone]   Fax:  [phone]  Physical Therapy Evaluation  Patient Details  Name: [name] MRN: [id number] Date of Birth: 11-23-1932 Referring Provider (PT): Dr. Lorin Mercy   Encounter Date: 03/10/2020  PT End of Session - 03/10/20 1259    Visit Number  1    Number of Visits  16    Date for PT Re-Evaluation  04/07/20    Authorization Time Period  POC 03/10/2020 - 05/05/2020    Authorization - Visit Number  1    Progress Note Due on Visit  10    PT Start Time  1300    PT Stop Time  1343    PT Time Calculation (min)  43 min    Activity Tolerance  Patient limited by pain    Behavior During Therapy  [facility] for tasks assessed/performed       Past Medical History:  Diagnosis Date  . Aortic stenosis    Echo 07/05/16: Very mild AS, Mean gradient 10 mm Hg, Peak gradient (S) 19 mmHg   . Arthritis    left hip and back  . Bronchiectasis    isolated to RML; status post right middle lobe partial resection.  . Complication of anesthesia    hard to wake up  . Dyspnea   . Gall stones   . GERD (gastroesophageal reflux disease)    history   . H/O osteoporosis   . Hypertension   . Hyperthyroidism    endocrinologist - Dr. Chalmers Cater  . Pneumonia   . Yeast infection     Past Surgical History:  Procedure Laterality Date  . ABDOMINAL HYSTERECTOMY  1974  . CHOLECYSTECTOMY N/A 07/02/2016   Procedure: LAPAROSCOPIC CHOLECYSTECTOMY;  Surgeon: Stark Klein, MD;  Location: [place];  Service: General;  Laterality: N/A;  . COLONOSCOPY    . ENDOSCOPIC RETROGRADE CHOLANGIOPANCREATOGRAPHY (ERCP) WITH PROPOFOL N/A 08/07/2018   Procedure: ENDOSCOPIC RETROGRADE CHOLANGIOPANCREATOGRAPHY (ERCP) WITH PROPOFOL;  Surgeon: Carol Ada, MD;  [facility]: [facility];  Service: Endoscopy;  Laterality: N/A;  . EYE SURGERY     stye removed  . HARDWARE REMOVAL Left 02/24/2020   Procedure: Hardware Removal, left knee;   Surgeon: [name], MD;  Location: [place];  Service: Orthopedics;  Laterality: Left;  . HERNIA REPAIR  1975  . KNEE SURGERY Left   . LUNG REMOVAL, PARTIAL  208   RML  . NM MYOVIEW LTD  06/2011   Normal EF. No ischemia or infarction.  [name]  08/07/2018   Procedure: [name];  Surgeon: Carol Ada, MD;  [facility];  Service: Endoscopy;;  balloon sweep  . STERIOD INJECTION Right 02/24/2020   Procedure: RIGHT KNEE STEROID INTRA-ARTICULAR MARCAINE/DEPO-MEDROL INJECTION UNDER ANESTHESIA;  Surgeon: [name], MD;  Location: [place];  Service: Orthopedics;  Laterality: Right;  . TOTAL HIP ARTHROPLASTY Left 04/05/2017   Procedure: LEFT TOTAL HIP ARTHROPLASTY ANTERIOR APPROACH;  Surgeon: [name], MD;  Location: [place];  Service: Orthopedics;  Laterality: Left;  . TOTAL HIP ARTHROPLASTY Right 12/19/2018  . TOTAL HIP ARTHROPLASTY Right 12/19/2018   Procedure: RIGHT TOTAL HIP ARTHROPLASTY-DIRECT ANTERIOR APPROACH;  Surgeon: [name], MD;  Location: [place];  Service: Orthopedics;  Laterality: Right;  . TOTAL KNEE ARTHROPLASTY Left 02/24/2020   Procedure: LEFT TOTAL KNEE ARTHROPLASTY;  Surgeon: [name], MD;  Location: [place];  Service: Orthopedics;  Laterality: Left;  . TRANSTHORACIC ECHOCARDIOGRAM  06/2011   Mild  concentric LVH. Normal EF with impaired relaxation. Mildly elevated RV pressures of 30 and 40 mmHg.  Marland Kitchen TRANSTHORACIC ECHOCARDIOGRAM  06/2016   normal LV size and function. EF 60-65% with GRD 2 DD. Mild aortic stenosis. Mild LA dilation. Mild to moderately increased PA pressures (46 mmHg).  . TUBAL LIGATION      There were no vitals filed for this visit.   Subjective Assessment - 03/10/20 1300    Subjective  Pt is an 86 yo female presenting s/p L TKA 5/19 with removal of hardware from prior tibial plateau fx. PMH includes: bilateral THA (L: 03/2017, R: 08/2019), chronic renal disease II, HTN, obesity, and R knee OA.  Had in home therapy.  Has  rollator and a stick.    Patient is accompained by:  Family member    Limitations  Walking;Standing;House hold activities    Patient Stated Goals  Reduce pain.  Wants to walk for exercise, ride a bike.    Currently in Pain?  Yes    Pain Score  6    at best 0/10, at worst 10/10.   Pain Location  Knee    Pain Orientation  Left    Pain Descriptors / Indicators  Aching;Tightness;Sore    Pain Type  Surgical pain    Pain Onset  1 to 4 weeks ago    Pain Frequency  Intermittent    Aggravating Factors   stairs, squats, end range movement    Pain Relieving Factors  pain medicine    Effect of Pain on Daily Activities  Limited in standing activity.         [facility] PT Assessment - 03/10/20 0001      Assessment   Medical Diagnosis  S/P L TKA    Referring Provider (PT)  Dr. Lorin Mercy    Onset Date/Surgical Date  02/24/20      Precautions   Precautions  None      Restrictions   Weight Bearing Restrictions  No      Balance Screen   Has the patient fallen in the past 6 months  No      [place] residence    Living Arrangements  Other (Comment)   family in shifts, typically lives alone   Home Access  Ramped entrance   2-3 steps   Home Layout  Two level      Prior Function   Level of Independence  Independent    Leisure  walking for exercise      Cognition   Overall Cognitive Status  Within Functional Limits for tasks assessed      ROM / Strength   AROM / PROM / Strength  Strength;PROM;AROM      AROM   AROM Assessment Site  Knee    Right/Left Knee  Left;Right    Right Knee Extension  0    Right Knee Flexion  112    Left Knee Extension  -17    Left Knee Flexion  78      PROM   PROM Assessment Site  Knee    Right/Left Knee  Left;Right    Left Knee Extension  --5    Left Knee Flexion  83      Strength   Strength Assessment Site  Ankle;Knee;Hip    Right/Left Hip  Left;Right    Right Hip Flexion  5/5    Left Hip Flexion  5/5    Right/Left  Knee  Left;Right  Right Knee Flexion  5/5    Right Knee Extension  5/5    Left Knee Flexion  4/5    Left Knee Extension  2+/5   lacking full extension against gravity   Right/Left Ankle  Left;Right    Right Ankle Dorsiflexion  5/5    Right Ankle Inversion  5/5    Right Ankle Eversion  5/5    Left Ankle Dorsiflexion  5/5    Left Ankle Inversion  5/5    Left Ankle Eversion  5/5      Palpation   Patella mobility  Limited Lt knee    Palpation comment  Tenderness medial and lateral jt line, surrounding incision      Transfers   Comments  Able to perform sit to stand from 18 inch chair c Rt LE dominant movement      Ambulation/Gait   Gait Comments  Rollator ambulation c forward trunk lean, minimal hip extension bilateral, reduced stance on Lt LE, minimal toe off progression, reduced step length contralaterally, maininted knee flexion on Lt.       Standardized Balance Assessment   Standardized Balance Assessment  Timed Up and Go Test                  Objective measurements completed on examination: See above findings.      [place] Adult PT Treatment/Exercise - 03/10/20 0001      Exercises   Exercises  Knee/Hip      Knee/Hip Exercises: Stretches   Other Knee/Hip Stretches  supine TKE extension stretch 2 mins      Knee/Hip Exercises: Seated   Other Seated Knee/Hip Exercises  seated LAQ alternating c Rt LE x 10      Knee/Hip Exercises: Supine   Other Supine Knee/Hip Exercises  slr, heel slide x 10      Manual Therapy   Manual therapy comments  Distraction, IR, flexion Lt knee in sitting c contralateral Rt LE extension             PT Education - 03/10/20 1345    Education Details  HEP, POC    Person(s) Educated  Child(ren);Patient    Methods  Explanation;Demonstration;Verbal cues;Handout    Comprehension  Returned demonstration;Verbalized understanding          PT Long Term Goals - 03/10/20 1258      PT LONG TERM GOAL #1   Title  Patient will  demonstrate/report pain at worst less than or equal to 2/10 to facilitate minimal limitation in daily activity secondary to pain symptoms.    Time  8    Period  Weeks    Status  New    Target Date  05/05/20      PT LONG TERM GOAL #2   Title  Patient will demonstrate independent use of home exercise program to facilitate ability to maintain/progress functional gains from skilled physical therapy services.    Time  8    Period  Weeks    Status  New    Target Date  05/05/20      PT LONG TERM GOAL #3   Title  Patient will demonstrate independent ambulation community distances > 300 ft to facilitate community integration at [facility].    Time  8    Period  Weeks    Status  New    Target Date  05/05/20      PT LONG TERM GOAL #4   Title  Patient will demonstrate Lt knee AROM 0-110  degrees to facilitate ability to perform transfers, sitting, ambulation, stair navigation s restriction due to mobility.    Time  8    Period  Weeks    Status  New    Target Date  05/05/20      PT LONG TERM GOAL #5   Title  Patient will demonstrate Bilateral LE MMT measured at 5/5 throughout to facilitate ability to perform usual standing, walking, stairs at PLOF s limitation due to symptoms.    Time  8    Period  Weeks    Status  New    Target Date  05/05/20             Plan - 03/10/20 1255    Clinical Impression Statement  Patient is a 86 y.o. female who comes to clinic with complaints of Lt knee pain s/p recent TKA with mobility, strength and movement coordination deficits that impair her ability to perform usual daily and recreational functional activities without increase difficulty/symptoms at this time.  Patient to benefit from skilled PT services to address impairments and limitations to improve to previous level of function without restriction secondary to condition.    Personal Factors and Comorbidities  Comorbidity 3+    Comorbidities  bilateral THA, most recent 08/2019.  Chronic renal disease,  HTN, obesity    Examination-Activity Limitations  Transfers;Locomotion Level;Squat;Stairs;Stand;Sit;Bed Mobility;Bend;Carry    Stability/Clinical Decision Making  Evolving/Moderate complexity    Clinical Decision Making  Moderate    Rehab Potential  --   fair to good   PT Frequency  2x / week    PT Duration  8 weeks    PT Treatment/Interventions  ADLs/Self Care Home Management;Electrical Stimulation;Iontophoresis 4mg /ml Dexamethasone;Moist Heat;Traction;Balance training;Therapeutic exercise;Therapeutic activities;Functional mobility training;Stair training;Gait training;Ultrasound;Neuromuscular re-education;Patient/family education;Wheelchair mobility training;Manual techniques;Manual lymph drainage;Vasopneumatic Device;Taping;Dry needling;Passive range of motion;Spinal Manipulations;Joint Manipulations    PT Next Visit Plan  Reassess HEP, progress mobility of Lt knee, strength in NWB then progress to WB balance control.    PT Home Exercise Plan  [id number]    Consulted and Agree with Plan of Care  Patient       Patient will benefit from skilled therapeutic intervention in order to improve the following deficits and impairments:  Abnormal gait, Decreased endurance, Hypomobility, Increased edema, Decreased activity tolerance, Decreased strength, Pain, Difficulty walking, Decreased mobility, Decreased balance, Decreased range of motion, Impaired perceived functional ability, Impaired flexibility, Decreased coordination  Visit Diagnosis: Chronic pain of left knee - Plan: PT plan of care cert/re-cert  Muscle weakness (generalized) - Plan: PT plan of care cert/re-cert  Difficulty in walking, not elsewhere classified - Plan: PT plan of care cert/re-cert  Localized edema - Plan: PT plan of care cert/re-cert  Stiffness of left knee, not elsewhere classified - Plan: PT plan of care cert/re-cert     Problem List Patient Active Problem List   Diagnosis Date Noted  . S/P total knee arthroplasty,  left 03/09/2020  . History of total hip arthroplasty, right 08/21/2019  . Intermittent claudication ([place]) 07/08/2019  . Hypertensive nephropathy 12/15/2018  . Chronic renal disease, stage II 12/15/2018  . H/O total hip arthroplasty, left 11/07/2017  . Presence of left artificial hip joint 05/14/2017  . Preop cardiovascular exam 03/12/2017  . Cough   . Abdominal pain   . Symptomatic cholelithiasis 07/01/2016  . Nonspecific ST-T wave electrocardiographic changes   . Lower leg edema 02/16/2015  . Obesity (BMI 30.0-34.9) 02/16/2015  . Essential hypertension 01/11/2012  . Hyperthyroidism 01/11/2012  . Hypercholesteremia 01/11/2012  .  H/O vaginal hysterectomy 1974 01/11/2012    Class: History of  . S/P removal of lung  partial R lobe  2010 01/11/2012  . History of hernia repair  R ing. 1975 01/11/2012  . BACK PAIN 11/12/2007  . DYSPNEA 11/11/2007   Scot Jun, PT, DPT, OCS, ATC 03/10/20  2:07 PM    [facility] [address] [place], [place], [address] Phone: [phone]   Fax:  [phone]  Name: [name] MRN: [id number] Date of Birth: [birth year]

## 2020-03-14 ENCOUNTER — Encounter: Payer: Self-pay | Admitting: Rehabilitative and Restorative Service Providers"

## 2020-03-14 ENCOUNTER — Other Ambulatory Visit: Payer: Self-pay

## 2020-03-14 ENCOUNTER — Ambulatory Visit (INDEPENDENT_AMBULATORY_CARE_PROVIDER_SITE_OTHER): Payer: Medicare Other | Admitting: Rehabilitative and Restorative Service Providers"

## 2020-03-14 DIAGNOSIS — G8929 Other chronic pain: Secondary | ICD-10-CM | POA: Diagnosis not present

## 2020-03-14 DIAGNOSIS — M25562 Pain in left knee: Secondary | ICD-10-CM

## 2020-03-14 DIAGNOSIS — M6281 Muscle weakness (generalized): Secondary | ICD-10-CM | POA: Diagnosis not present

## 2020-03-14 DIAGNOSIS — R600 Localized edema: Secondary | ICD-10-CM

## 2020-03-14 DIAGNOSIS — M25662 Stiffness of left knee, not elsewhere classified: Secondary | ICD-10-CM

## 2020-03-14 DIAGNOSIS — R262 Difficulty in walking, not elsewhere classified: Secondary | ICD-10-CM | POA: Diagnosis not present

## 2020-03-14 NOTE — Therapy (Signed)
[place], [place], [address] Phone: [phone]   Fax:  [phone]  Physical Therapy Treatment  Patient Details  Name: [name] MRN: [id number] Date of Birth: 1933/10/08 Referring Provider (PT): Dr. Lorin Mercy   Encounter Date: 03/14/2020  PT End of Session - 03/14/20 1149    Visit Number  2    Number of Visits  16    Date for PT Re-Evaluation  04/07/20    Authorization Time Period  POC 03/10/2020 - 05/05/2020    Authorization - Visit Number  2    Progress Note Due on Visit  10    PT Start Time  1147    PT Stop Time  1227    PT Time Calculation (min)  40 min    Activity Tolerance  Patient limited by pain    Behavior During Therapy  [facility] for tasks assessed/performed       Past Medical History:  Diagnosis Date  . Aortic stenosis    Echo 07/05/16: Very mild AS, Mean gradient 10 mm Hg, Peak gradient (S) 19 mmHg   . Arthritis    left hip and back  . Bronchiectasis    isolated to RML; status post right middle lobe partial resection.  . Complication of anesthesia    hard to wake up  . Dyspnea   . Gall stones   . GERD (gastroesophageal reflux disease)    history   . H/O osteoporosis   . Hypertension   . Hyperthyroidism    endocrinologist - Dr. Chalmers Cater  . Pneumonia   . Yeast infection     Past Surgical History:  Procedure Laterality Date  . ABDOMINAL HYSTERECTOMY  1974  . CHOLECYSTECTOMY N/A 07/02/2016   Procedure: LAPAROSCOPIC CHOLECYSTECTOMY;  Surgeon: Stark Klein, MD;  Location: [place];  Service: General;  Laterality: N/A;  . COLONOSCOPY    . ENDOSCOPIC RETROGRADE CHOLANGIOPANCREATOGRAPHY (ERCP) WITH PROPOFOL N/A 08/07/2018   Procedure: ENDOSCOPIC RETROGRADE CHOLANGIOPANCREATOGRAPHY (ERCP) WITH PROPOFOL;  Surgeon: Carol Ada, MD;  [facility];  Service: Endoscopy;  Laterality: N/A;  . EYE SURGERY     stye removed  . HARDWARE REMOVAL Left 02/24/2020   Procedure: Hardware Removal, left knee;   Surgeon: [name], MD;  Location: [place];  Service: Orthopedics;  Laterality: Left;  . HERNIA REPAIR  1975  . KNEE SURGERY Left   . LUNG REMOVAL, PARTIAL  208   RML  . NM MYOVIEW LTD  06/2011   Normal EF. No ischemia or infarction.  [name]  08/07/2018   Procedure: [name];  Surgeon: Carol Ada, MD;  [facility];  Service: Endoscopy;;  balloon sweep  . STERIOD INJECTION Right 02/24/2020   Procedure: RIGHT KNEE STEROID INTRA-ARTICULAR MARCAINE/DEPO-MEDROL INJECTION UNDER ANESTHESIA;  Surgeon: [name], MD;  Location: [place];  Service: Orthopedics;  Laterality: Right;  . TOTAL HIP ARTHROPLASTY Left 04/05/2017   Procedure: LEFT TOTAL HIP ARTHROPLASTY ANTERIOR APPROACH;  Surgeon: [name], MD;  Location: [place];  Service: Orthopedics;  Laterality: Left;  . TOTAL HIP ARTHROPLASTY Right 12/19/2018  . TOTAL HIP ARTHROPLASTY Right 12/19/2018   Procedure: RIGHT TOTAL HIP ARTHROPLASTY-DIRECT ANTERIOR APPROACH;  Surgeon: [name], MD;  Location: [place];  Service: Orthopedics;  Laterality: Right;  . TOTAL KNEE ARTHROPLASTY Left 02/24/2020   Procedure: LEFT TOTAL KNEE ARTHROPLASTY;  Surgeon: [name], MD;  Location: [place];  Service: Orthopedics;  Laterality: Left;  . TRANSTHORACIC ECHOCARDIOGRAM  06/2011   Mild  concentric LVH. Normal EF with impaired relaxation. Mildly elevated RV pressures of 30 and 40 mmHg.  Marland Kitchen TRANSTHORACIC ECHOCARDIOGRAM  06/2016   normal LV size and function. EF 60-65% with GRD 2 DD. Mild aortic stenosis. Mild LA dilation. Mild to moderately increased PA pressures (46 mmHg).  . TUBAL LIGATION      There were no vitals filed for this visit.  Subjective Assessment - 03/14/20 1153    Subjective  Pt. stated a little pain today, took some medicine today.    Patient is accompained by:  Family member    Limitations  Walking;Standing;House hold activities    Patient Stated Goals  Reduce pain.  Wants to walk for exercise, ride a bike.     Currently in Pain?  Yes    Pain Score  5     Pain Location  Knee    Pain Orientation  Left    Pain Descriptors / Indicators  Aching;Tightness;Sore    Pain Type  Surgical pain    Pain Onset  1 to 4 weeks ago    Pain Frequency  Intermittent    Aggravating Factors   walking, standing    Pain Relieving Factors  pain medicine                        [facility] Adult PT Treatment/Exercise - 03/14/20 0001      Neuro Re-ed    Neuro Re-ed Details   [employer] anterior/posterior weight shift control x 20, retro step x 20 Lt LE post      Knee/Hip Exercises: Stretches   Other Knee/Hip Stretches  supine TKE extension 2 mins      Knee/Hip Exercises: Aerobic   Nustep  lvl 5 6 mins      Knee/Hip Exercises: Seated   Other Seated Knee/Hip Exercises  seated LAQ alternating c Rt LE x 10      Knee/Hip Exercises: Supine   Heel Slides  PROM;Left   10 sec x 10 c strap   Straight Leg Raises  Left;Strengthening;2 sets;10 reps      Manual Therapy   Manual therapy comments  Distraction, IR, flexion Lt knee in sitting c contralateral Rt LE extension                  PT Long Term Goals - 03/10/20 1258      PT LONG TERM GOAL #1   Title  Patient will demonstrate/report pain at worst less than or equal to 2/10 to facilitate minimal limitation in daily activity secondary to pain symptoms.    Time  8    Period  Weeks    Status  New    Target Date  05/05/20      PT LONG TERM GOAL #2   Title  Patient will demonstrate independent use of home exercise program to facilitate ability to maintain/progress functional gains from skilled physical therapy services.    Time  8    Period  Weeks    Status  New    Target Date  05/05/20      PT LONG TERM GOAL #3   Title  Patient will demonstrate independent ambulation community distances > 300 ft to facilitate community integration at [facility].    Time  8    Period  Weeks    Status  New    Target Date  05/05/20      PT LONG TERM GOAL #4    Title  Patient will demonstrate  Lt knee AROM 0-110 degrees to facilitate ability to perform transfers, sitting, ambulation, stair navigation s restriction due to mobility.    Time  8    Period  Weeks    Status  New    Target Date  05/05/20      PT LONG TERM GOAL #5   Title  Patient will demonstrate Bilateral LE MMT measured at 5/5 throughout to facilitate ability to perform usual standing, walking, stairs at PLOF s limitation due to symptoms.    Time  8    Period  Weeks    Status  New    Target Date  05/05/20            Plan - 03/14/20 1206    Clinical Impression Statement  Cues required for intervention techniques for new and also for HEP.  Hip flexor and quad weakness evident throughout mobility intervention.  Continued skilled PT services indicated at this time to progress towards goals.    Personal Factors and Comorbidities  Comorbidity 3+    Comorbidities  bilateral THA, most recent 08/2019.  Chronic renal disease, HTN, obesity    Examination-Activity Limitations  Transfers;Locomotion Level;Squat;Stairs;Stand;Sit;Bed Mobility;Bend;Carry    Stability/Clinical Decision Making  Evolving/Moderate complexity    Rehab Potential  --   fair to good   PT Frequency  2x / week    PT Duration  8 weeks    PT Treatment/Interventions  ADLs/Self Care Home Management;Electrical Stimulation;Iontophoresis 4mg /ml Dexamethasone;Moist Heat;Traction;Balance training;Therapeutic exercise;Therapeutic activities;Functional mobility training;Stair training;Gait training;Ultrasound;Neuromuscular re-education;Patient/family education;Wheelchair mobility training;Manual techniques;Manual lymph drainage;Vasopneumatic Device;Taping;Dry needling;Passive range of motion;Spinal Manipulations;Joint Manipulations    PT Next Visit Plan  Continue to improve mobility, strength, WB in Lt LE.    PT Home Exercise Plan  [id number]    Consulted and Agree with Plan of Care  Patient       Patient will benefit from skilled  therapeutic intervention in order to improve the following deficits and impairments:  Abnormal gait, Decreased endurance, Hypomobility, Increased edema, Decreased activity tolerance, Decreased strength, Pain, Difficulty walking, Decreased mobility, Decreased balance, Decreased range of motion, Impaired perceived functional ability, Impaired flexibility, Decreased coordination  Visit Diagnosis: Chronic pain of left knee  Muscle weakness (generalized)  Difficulty in walking, not elsewhere classified  Localized edema  Stiffness of left knee, not elsewhere classified     Problem List Patient Active Problem List   Diagnosis Date Noted  . S/P total knee arthroplasty, left 03/09/2020  . History of total hip arthroplasty, right 08/21/2019  . Intermittent claudication ([place]) 07/08/2019  . Hypertensive nephropathy 12/15/2018  . Chronic renal disease, stage II 12/15/2018  . H/O total hip arthroplasty, left 11/07/2017  . Presence of left artificial hip joint 05/14/2017  . Preop cardiovascular exam 03/12/2017  . Cough   . Abdominal pain   . Symptomatic cholelithiasis 07/01/2016  . Nonspecific ST-T wave electrocardiographic changes   . Lower leg edema 02/16/2015  . Obesity (BMI 30.0-34.9) 02/16/2015  . Essential hypertension 01/11/2012  . Hyperthyroidism 01/11/2012  . Hypercholesteremia 01/11/2012  . H/O vaginal hysterectomy 1974 01/11/2012    Class: History of  . S/P removal of lung  partial R lobe  2010 01/11/2012  . History of hernia repair  R ing. 1975 01/11/2012  . BACK PAIN 11/12/2007  . DYSPNEA 11/11/2007   Scot Jun, PT, DPT, OCS, ATC 03/14/20  12:23 PM    [facility] [address] [place], [place], [address] Phone: [phone]   Fax:  [phone]  Name: Savannah Fitzhenry  Smith MRN: [id number] Date of Birth: 11-Apr-1933

## 2020-03-15 NOTE — Discharge Summary (Signed)
Patient ID: [name] MRN: [id number] DOB/AGE: [id number] y.o.  Admit date: 02/24/2020 Discharge date: 03/15/2020  Admission Diagnoses:  Active Problems:   * No active hospital problems. *   Discharge Diagnoses:  Active Problems:   * No active hospital problems. *  status post Procedure(s): LEFT TOTAL KNEE ARTHROPLASTY RIGHT KNEE STEROID INTRA-ARTICULAR MARCAINE/DEPO-MEDROL INJECTION UNDER ANESTHESIA Hardware Removal, left knee  Past Medical History:  Diagnosis Date  . Aortic stenosis    Echo 07/05/16: Very mild AS, Mean gradient 10 mm Hg, Peak gradient (S) 19 mmHg   . Arthritis    left hip and back  . Bronchiectasis    isolated to RML; status post right middle lobe partial resection.  . Complication of anesthesia    hard to wake up  . Dyspnea   . Gall stones   . GERD (gastroesophageal reflux disease)    history   . H/O osteoporosis   . Hypertension   . Hyperthyroidism    endocrinologist - Dr. Chalmers Cater  . Pneumonia   . Yeast infection     Surgeries: Procedure(s): LEFT TOTAL KNEE ARTHROPLASTY RIGHT KNEE STEROID INTRA-ARTICULAR MARCAINE/DEPO-MEDROL INJECTION UNDER ANESTHESIA Hardware Removal, left knee on 02/24/2020   Consultants:   Discharged Condition: Improved  Hospital Course: [name] is an 86 y.o. female who was admitted 02/24/2020 for operative treatment of left knee DJD. Patient failed conservative treatments (please see the history and physical for the specifics) and had severe unremitting pain that affects sleep, daily activities and work/hobbies. After pre-op clearance, the patient was taken to the operating room on 02/24/2020 and underwent  Procedure(s): LEFT TOTAL KNEE ARTHROPLASTY RIGHT KNEE STEROID INTRA-ARTICULAR MARCAINE/DEPO-MEDROL INJECTION UNDER ANESTHESIA Hardware Removal, left knee.    Patient was given perioperative antibiotics:  Anti-infectives (From admission, onward)   Start     Dose/Rate Route Frequency Ordered Stop   02/24/20  1100  ceFAZolin (ANCEF) IVPB 2g/100 mL premix     2 g 200 mL/hr over 30 Minutes Intravenous On call to O.R. 02/24/20 1045 02/24/20 1250       Patient was given sequential compression devices and early ambulation to prevent DVT.   Patient benefited maximally from hospital stay and there were no complications. At the time of discharge, the patient was urinating/moving their bowels without difficulty, tolerating a regular diet, pain is controlled with oral pain medications and they have been cleared by PT/OT.   Recent vital signs: No data found.   Recent laboratory studies: No results for input(s): WBC, HGB, HCT, PLT, NA, K, CL, CO2, BUN, CREATININE, GLUCOSE, INR, CALCIUM in the last 72 hours.  Invalid input(s): PT, 2   Discharge Medications:   Allergies as of 02/26/2020      Reactions   Ibuprofen Other (See Comments)   hallucinations   Naproxen Sodium Other (See Comments)   hallucinations      Medication List    STOP taking these medications   diclofenac Sodium 1 % Gel Commonly known as: VOLTAREN   Tylenol 8 Hour 650 MG CR tablet Generic drug: acetaminophen     TAKE these medications   Ascorbic Acid 500 MG Chew Chew 500 mg by mouth daily. Notes to patient: Tomorrow, 02/27/20.   aspirin 325 MG tablet Commonly known as: Bayer Aspirin Take 1 tablet (325 mg total) by mouth daily. Take one daily for 4 wks with food to decrease blood clot risks. Notes to patient: Tomorrow, 02/27/20.   busPIRone 5 MG tablet Commonly known as: BUSPAR Take  1 tablet (5 mg total) by mouth 2 (two) times daily. What changed:   when to take this  reasons to take this Notes to patient: Tonight, 02/26/20.   Calcium 600 600 MG Tabs tablet Generic drug: calcium carbonate Take 600 mg by mouth daily. Notes to patient: Tomorrow, 02/27/20.   cholecalciferol 25 MCG (1000 UNIT) tablet Commonly known as: VITAMIN D3 Take 1,000 Units by mouth daily. Notes to patient: Tomorrow, 02/27/20.   Flaxseed Oil  1000 MG Caps Take 1,000 mg by mouth daily. Notes to patient: Tomorrow, 02/27/20.   furosemide 20 MG tablet Commonly known as: LASIX Take 20 mg by mouth daily. Notes to patient: Tomorrow, 02/27/20.   methimazole 5 MG tablet Commonly known as: TAPAZOLE Take 2.5 mg by mouth daily. Notes to patient: Tomorrow, 02/27/20.   metoprolol succinate 25 MG 24 hr tablet Commonly known as: Toprol XL Take 1 tablet (25 mg total) by mouth daily. Notes to patient: Tomorrow, 02/27/20.   nystatin cream Commonly known as: MYCOSTATIN APPLY CREAM TO AFFECTED AREA TWICE A DAY AS NEEDED What changed: See the new instructions.   potassium chloride SA 20 MEQ tablet Commonly known as: KLOR-CON Take 1 tablet (20 mEq total) by mouth daily. Notes to patient: Tomorrow, 02/27/20.   sulfamethoxazole-trimethoprim 800-160 MG tablet Commonly known as: BACTRIM DS 1 po bid x 5 days Notes to patient: Tomorrow, 02/27/20.   Tricor 145 MG tablet Generic drug: fenofibrate Take 1 tablet (145 mg total) by mouth daily. Notes to patient: Tomorrow, 02/27/20.       Diagnostic Studies: DG Chest 2 View  Result Date: 02/19/2020 CLINICAL DATA:  Preop testing. EXAM: CHEST - 2 VIEW COMPARISON:  Chest radiograph 07/15/2018 FINDINGS: Heart size within normal limits. Unchanged elevation of the right hemidiaphragm with postoperative changes to the perihilar right lung. Minimal atelectasis and/or scarring within the right mid lung. Lungs are otherwise clear. No evidence of pleural effusion or pneumothorax. No acute bony abnormality. Surgical clips within the upper abdomen. IMPRESSION: Minimal linear atelectasis and/or scarring within the right mid lung. Otherwise stable examination as compared to 07/03/2016. Unchanged elevation of the right hemidiaphragm with postoperative changes to the perihilar right lung. Electronically Signed   By: Kellie Simmering DO   On: 02/19/2020 07:55   DG Knee Left Port  Result Date: 02/24/2020 CLINICAL DATA:   Status post left knee replacement EXAM: PORTABLE LEFT KNEE - 1-2 VIEW COMPARISON:  01/26/2019 FINDINGS: Left knee prosthesis is now seen in satisfactory position. Previously seen fixation pins have been removed. No soft tissue or bony abnormality is noted. IMPRESSION: Status post left knee replacement. Electronically Signed   By: Inez Catalina M.D.   On: 02/24/2020 16:53      Follow-up Information    [name], MD. Go on 03/09/2020.   Specialty: Orthopedic Surgery Why: at 10:45 am for 2 week post-op check in office. Contact information: [place] [address] [phone]        [facility] Follow up.   Specialty: Home Health Services Why: Someone will be in touch with you from the home health agency after discharge to coordinate your first in home physical therapy appointment Contact information: [place] [address] [phone]        [facility]. Go on 03/10/2020.   Specialty: Rehabilitation Why: at 1:00 pm for your first Outpatient Physical Therapy appointment. Contact information: [address] [place] [address] [phone]  [facility] Follow up.   Why: This is a smaller type of toilet riser that snaps on to the toilet with handles on the side if you prefer.  You might want your daughter to pick this type up from the store if you so choose.  It costs around 35.00, but Medicare does not cover it. Contact information: [place] [address] [phone]           Discharge Plan:  discharge to home  Disposition:     Signed: Benjiman Core  [facility] [phone] 03/15/2020, 3:12 PM

## 2020-03-17 ENCOUNTER — Other Ambulatory Visit: Payer: Self-pay | Admitting: Orthopaedic Surgery

## 2020-03-17 ENCOUNTER — Telehealth: Payer: Self-pay | Admitting: *Deleted

## 2020-03-17 MED ORDER — HYDROCODONE-ACETAMINOPHEN 5-325 MG PO TABS: 1.0000 | ORAL_TABLET | Freq: Four times a day (QID) | ORAL | 0 refills | Status: DC | PRN

## 2020-03-17 NOTE — Telephone Encounter (Signed)
noted 

## 2020-03-17 NOTE — Telephone Encounter (Signed)
I sent in Rx and called and left her a voice mail

## 2020-03-17 NOTE — Telephone Encounter (Signed)
Patient called requesting refill of pain medication. Thank you. 

## 2020-03-17 NOTE — Progress Notes (Signed)
norco 5/325 sent in . Was on norco 7.5 /325 post TKA

## 2020-03-22 ENCOUNTER — Ambulatory Visit (INDEPENDENT_AMBULATORY_CARE_PROVIDER_SITE_OTHER): Payer: Medicare Other | Admitting: Rehabilitative and Restorative Service Providers"

## 2020-03-22 ENCOUNTER — Other Ambulatory Visit: Payer: Self-pay

## 2020-03-22 ENCOUNTER — Encounter: Payer: Self-pay | Admitting: Rehabilitative and Restorative Service Providers"

## 2020-03-22 DIAGNOSIS — R600 Localized edema: Secondary | ICD-10-CM | POA: Diagnosis not present

## 2020-03-22 DIAGNOSIS — M25662 Stiffness of left knee, not elsewhere classified: Secondary | ICD-10-CM | POA: Diagnosis not present

## 2020-03-22 DIAGNOSIS — M6281 Muscle weakness (generalized): Secondary | ICD-10-CM | POA: Diagnosis not present

## 2020-03-22 DIAGNOSIS — R262 Difficulty in walking, not elsewhere classified: Secondary | ICD-10-CM | POA: Diagnosis not present

## 2020-03-22 DIAGNOSIS — M25562 Pain in left knee: Secondary | ICD-10-CM

## 2020-03-22 DIAGNOSIS — G8929 Other chronic pain: Secondary | ICD-10-CM | POA: Diagnosis not present

## 2020-03-22 NOTE — Therapy (Signed)
[address] [place], [place], [address] Phone: [phone]   Fax:  [phone]  Physical Therapy Treatment  Patient Details  Name: [name] MRN: [id number] Date of Birth: 1933-10-01 Referring Provider (PT): Dr. Lorin Mercy   Encounter Date: 03/22/2020   PT End of Session - 03/22/20 1558    Visit Number 3    Number of Visits 16    Date for PT Re-Evaluation 04/07/20    Authorization Time Period POC 03/10/2020 - 05/05/2020    Authorization - Visit Number 3    Progress Note Due on Visit 10    PT Start Time 1558    PT Stop Time 1645    PT Time Calculation (min) 47 min    Activity Tolerance Patient limited by fatigue    Behavior During Therapy [facility] for tasks assessed/performed           Past Medical History:  Diagnosis Date  . Aortic stenosis    Echo 07/05/16: Very mild AS, Mean gradient 10 mm Hg, Peak gradient (S) 19 mmHg   . Arthritis    left hip and back  . Bronchiectasis    isolated to RML; status post right middle lobe partial resection.  . Complication of anesthesia    hard to wake up  . Dyspnea   . Gall stones   . GERD (gastroesophageal reflux disease)    history   . H/O osteoporosis   . Hypertension   . Hyperthyroidism    endocrinologist - Dr. Chalmers Cater  . Pneumonia   . Yeast infection     Past Surgical History:  Procedure Laterality Date  . ABDOMINAL HYSTERECTOMY  1974  . CHOLECYSTECTOMY N/A 07/02/2016   Procedure: LAPAROSCOPIC CHOLECYSTECTOMY;  Surgeon: Stark Klein, MD;  Location: [place];  Service: General;  Laterality: N/A;  . COLONOSCOPY    . ENDOSCOPIC RETROGRADE CHOLANGIOPANCREATOGRAPHY (ERCP) WITH PROPOFOL N/A 08/07/2018   Procedure: ENDOSCOPIC RETROGRADE CHOLANGIOPANCREATOGRAPHY (ERCP) WITH PROPOFOL;  Surgeon: Carol Ada, MD;  Location: [facility];  Service: Endoscopy;  Laterality: N/A;  . EYE SURGERY     stye removed  . HARDWARE REMOVAL Left 02/24/2020   Procedure: Hardware Removal, left knee;   Surgeon: [name], MD;  Location: [place];  Service: Orthopedics;  Laterality: Left;  . HERNIA REPAIR  1975  . KNEE SURGERY Left   . LUNG REMOVAL, PARTIAL  208   RML  . NM MYOVIEW LTD  06/2011   Normal EF. No ischemia or infarction.  [name]  08/07/2018   Procedure: [name];  Surgeon: Carol Ada, MD;  Location: [facility];  Service: Endoscopy;;  balloon sweep  . STERIOD INJECTION Right 02/24/2020   Procedure: RIGHT KNEE STEROID INTRA-ARTICULAR MARCAINE/DEPO-MEDROL INJECTION UNDER ANESTHESIA;  Surgeon: [name], MD;  Location: [place];  Service: Orthopedics;  Laterality: Right;  . TOTAL HIP ARTHROPLASTY Left 04/05/2017   Procedure: LEFT TOTAL HIP ARTHROPLASTY ANTERIOR APPROACH;  Surgeon: [name], MD;  Location: [place];  Service: Orthopedics;  Laterality: Left;  . TOTAL HIP ARTHROPLASTY Right 12/19/2018  . TOTAL HIP ARTHROPLASTY Right 12/19/2018   Procedure: RIGHT TOTAL HIP ARTHROPLASTY-DIRECT ANTERIOR APPROACH;  Surgeon: [name], MD;  Location: [place];  Service: Orthopedics;  Laterality: Right;  . TOTAL KNEE ARTHROPLASTY Left 02/24/2020   Procedure: LEFT TOTAL KNEE ARTHROPLASTY;  Surgeon: [name], MD;  Location: [place];  Service: Orthopedics;  Laterality: Left;  . TRANSTHORACIC ECHOCARDIOGRAM  06/2011   Mild concentric LVH. Normal EF with impaired  relaxation. Mildly elevated RV pressures of 30 and 40 mmHg.  Marland Kitchen TRANSTHORACIC ECHOCARDIOGRAM  06/2016   normal LV size and function. EF 60-65% with GRD 2 DD. Mild aortic stenosis. Mild LA dilation. Mild to moderately increased PA pressures (46 mmHg).  . TUBAL LIGATION      There were no vitals filed for this visit.   Subjective Assessment - 03/22/20 1645    Subjective Pt. indicated 3-4 pain today.    Patient is accompained by: Family member    Limitations Walking;Standing;House hold activities    Patient Stated Goals Reduce pain.  Wants to walk for exercise, ride a bike.    Currently in Pain? Yes    Pain  Score 4     Pain Location Knee    Pain Orientation Left    Pain Descriptors / Indicators Aching;Tightness    Pain Type Surgical pain    Pain Onset 1 to 4 weeks ago    Aggravating Factors  walking/ standing              [facility] PT Assessment - 03/22/20 0001      AROM   Left Knee Flexion 91                         [facility] Adult PT Treatment/Exercise - 03/22/20 0001      Neuro Re-ed    Neuro Re-ed Details  tandem stance 30 sec x 4 bilateral, retro step 20 x Lt LE      Knee/Hip Exercises: Stretches   Other Knee/Hip Stretches supine TKE 5 mins      Knee/Hip Exercises: Aerobic   Nustep lvl 5 9 mins      Knee/Hip Exercises: Seated   Long Arc Quad Strengthening;Left   30x    Long Arc Quad Weight 4 lbs.    Sit to Sand without UE support   x 10 18 inch mat     Modalities   Modalities Vasopneumatic      Vasopneumatic   Number Minutes Vasopneumatic  6 minutes    Vasopnuematic Location  Knee    Vasopneumatic Pressure Low    Vasopneumatic Temperature  34      Manual Therapy   Manual therapy comments Distraction, IR, flexion Lt knee in sitting c contralateral Rt LE extension   isometric knee ext in mid range 5 sec holds                      PT Long Term Goals - 03/10/20 1258      PT LONG TERM GOAL #1   Title Patient will demonstrate/report pain at worst less than or equal to 2/10 to facilitate minimal limitation in daily activity secondary to pain symptoms.    Time 8    Period Weeks    Status New    Target Date 05/05/20      PT LONG TERM GOAL #2   Title Patient will demonstrate independent use of home exercise program to facilitate ability to maintain/progress functional gains from skilled physical therapy services.    Time 8    Period Weeks    Status New    Target Date 05/05/20      PT LONG TERM GOAL #3   Title Patient will demonstrate independent ambulation community distances > 300 ft to facilitate community integration at [facility].    Time 8     Period Weeks    Status New    Target Date  05/05/20      PT LONG TERM GOAL #4   Title Patient will demonstrate Lt knee AROM 0-110 degrees to facilitate ability to perform transfers, sitting, ambulation, stair navigation s restriction due to mobility.    Time 8    Period Weeks    Status New    Target Date 05/05/20      PT LONG TERM GOAL #5   Title Patient will demonstrate Bilateral LE MMT measured at 5/5 throughout to facilitate ability to perform usual standing, walking, stairs at PLOF s limitation due to symptoms.    Time 8    Period Weeks    Status New    Target Date 05/05/20                 Plan - 03/22/20 1629    Clinical Impression Statement Fair performance on static non complaint surface balance intervention today but showing progress. Consistent motivation cues required during intervention to improve adherence.  Continued strengthening in Lt LE c mobility and balance improvements required.    Personal Factors and Comorbidities Comorbidity 3+    Comorbidities bilateral THA, most recent 08/2019.  Chronic renal disease, HTN, obesity    Examination-Activity Limitations Transfers;Locomotion Level;Squat;Stairs;Stand;Sit;Bed Mobility;Bend;Carry    Stability/Clinical Decision Making Evolving/Moderate complexity    Rehab Potential --   fair to good   PT Frequency 2x / week    PT Duration 8 weeks    PT Treatment/Interventions ADLs/Self Care Home Management;Electrical Stimulation;Iontophoresis 4mg /ml Dexamethasone;Moist Heat;Traction;Balance training;Therapeutic exercise;Therapeutic activities;Functional mobility training;Stair training;Gait training;Ultrasound;Neuromuscular re-education;Patient/family education;Wheelchair mobility training;Manual techniques;Manual lymph drainage;Vasopneumatic Device;Taping;Dry needling;Passive range of motion;Spinal Manipulations;Joint Manipulations    PT Next Visit Plan Progress mobility, strength and balance for progressive mobility.    PT  Home Exercise Plan [id number]    Consulted and Agree with Plan of Care Patient           Patient will benefit from skilled therapeutic intervention in order to improve the following deficits and impairments:  Abnormal gait, Decreased endurance, Hypomobility, Increased edema, Decreased activity tolerance, Decreased strength, Pain, Difficulty walking, Decreased mobility, Decreased balance, Decreased range of motion, Impaired perceived functional ability, Impaired flexibility, Decreased coordination  Visit Diagnosis: Chronic pain of left knee  Muscle weakness (generalized)  Difficulty in walking, not elsewhere classified  Localized edema  Stiffness of left knee, not elsewhere classified     Problem List Patient Active Problem List   Diagnosis Date Noted  . S/P total knee arthroplasty, left 03/09/2020  . History of total hip arthroplasty, right 08/21/2019  . Intermittent claudication ([place]) 07/08/2019  . Hypertensive nephropathy 12/15/2018  . Chronic renal disease, stage II 12/15/2018  . H/O total hip arthroplasty, left 11/07/2017  . Presence of left artificial hip joint 05/14/2017  . Preop cardiovascular exam 03/12/2017  . Cough   . Abdominal pain   . Symptomatic cholelithiasis 07/01/2016  . Nonspecific ST-T wave electrocardiographic changes   . Lower leg edema 02/16/2015  . Obesity (BMI 30.0-34.9) 02/16/2015  . Essential hypertension 01/11/2012  . Hyperthyroidism 01/11/2012  . Hypercholesteremia 01/11/2012  . H/O vaginal hysterectomy 1974 01/11/2012    Class: History of  . S/P removal of lung  partial R lobe  2010 01/11/2012  . History of hernia repair  R ing. 1975 01/11/2012  . BACK PAIN 11/12/2007  . DYSPNEA 11/11/2007    Scot Jun, PT, DPT, OCS, ATC 03/22/20  4:48 PM    [facility] [address] [place], [place], [address] Phone: [phone]   Fax:  [phone]  Name: DEANETTE TULLIUS MRN: [id number] Date of Birth:  1933-09-30

## 2020-03-24 ENCOUNTER — Other Ambulatory Visit: Payer: Self-pay

## 2020-03-24 ENCOUNTER — Other Ambulatory Visit: Payer: Self-pay | Admitting: Orthopaedic Surgery

## 2020-03-24 ENCOUNTER — Telehealth: Payer: Self-pay | Admitting: *Deleted

## 2020-03-24 ENCOUNTER — Ambulatory Visit (INDEPENDENT_AMBULATORY_CARE_PROVIDER_SITE_OTHER): Payer: Medicare Other | Admitting: Rehabilitative and Restorative Service Providers"

## 2020-03-24 DIAGNOSIS — M6281 Muscle weakness (generalized): Secondary | ICD-10-CM | POA: Diagnosis not present

## 2020-03-24 DIAGNOSIS — R262 Difficulty in walking, not elsewhere classified: Secondary | ICD-10-CM | POA: Diagnosis not present

## 2020-03-24 DIAGNOSIS — M25662 Stiffness of left knee, not elsewhere classified: Secondary | ICD-10-CM

## 2020-03-24 DIAGNOSIS — G8929 Other chronic pain: Secondary | ICD-10-CM

## 2020-03-24 DIAGNOSIS — R600 Localized edema: Secondary | ICD-10-CM

## 2020-03-24 DIAGNOSIS — M25562 Pain in left knee: Secondary | ICD-10-CM

## 2020-03-24 NOTE — Telephone Encounter (Signed)
30 day Ortho bundle call to patient. She has requested refill of pain medication. She states muscles surrounding knee still painful even with medication. Requested if she could take Tylenol in between doses (she has 650 mg arthritis Acetaminophen at home). Thanks.

## 2020-03-24 NOTE — Therapy (Signed)
[place], [place], [address] Phone: [phone]   Fax:  [phone]  Physical Therapy Treatment  Patient Details  Name: [name] MRN: [id number] Date of Birth: 03/31/1933 Referring Provider (PT): Dr. Lorin Mercy   Encounter Date: 03/24/2020   PT End of Session - 03/24/20 1612    Visit Number 4    Number of Visits 16    Date for PT Re-Evaluation 05/05/20    Authorization Time Period POC 03/10/2020 - 05/05/2020    Authorization - Visit Number 4    Progress Note Due on Visit 10    PT Start Time 1604    PT Stop Time 1645    PT Time Calculation (min) 41 min    Activity Tolerance Patient limited by fatigue    Behavior During Therapy [facility] for tasks assessed/performed           Past Medical History:  Diagnosis Date  . Aortic stenosis    Echo 07/05/16: Very mild AS, Mean gradient 10 mm Hg, Peak gradient (S) 19 mmHg   . Arthritis    left hip and back  . Bronchiectasis    isolated to RML; status post right middle lobe partial resection.  . Complication of anesthesia    hard to wake up  . Dyspnea   . Gall stones   . GERD (gastroesophageal reflux disease)    history   . H/O osteoporosis   . Hypertension   . Hyperthyroidism    endocrinologist - Dr. Chalmers Cater  . Pneumonia   . Yeast infection     Past Surgical History:  Procedure Laterality Date  . ABDOMINAL HYSTERECTOMY  1974  . CHOLECYSTECTOMY N/A 07/02/2016   Procedure: LAPAROSCOPIC CHOLECYSTECTOMY;  Surgeon: Stark Klein, MD;  Location: [place];  Service: General;  Laterality: N/A;  . COLONOSCOPY    . ENDOSCOPIC RETROGRADE CHOLANGIOPANCREATOGRAPHY (ERCP) WITH PROPOFOL N/A 08/07/2018   Procedure: ENDOSCOPIC RETROGRADE CHOLANGIOPANCREATOGRAPHY (ERCP) WITH PROPOFOL;  Surgeon: Carol Ada, MD;  [facility];  Service: Endoscopy;  Laterality: N/A;  . EYE SURGERY     stye removed  . HARDWARE REMOVAL Left 02/24/2020   Procedure: Hardware Removal, left knee;   Surgeon: [name], MD;  Location: [place];  Service: Orthopedics;  Laterality: Left;  . HERNIA REPAIR  1975  . KNEE SURGERY Left   . LUNG REMOVAL, PARTIAL  208   RML  . NM MYOVIEW LTD  06/2011   Normal EF. No ischemia or infarction.  [name]  08/07/2018   Procedure: [name];  Surgeon: Carol Ada, MD;  [facility];  Service: Endoscopy;;  balloon sweep  . STERIOD INJECTION Right 02/24/2020   Procedure: RIGHT KNEE STEROID INTRA-ARTICULAR MARCAINE/DEPO-MEDROL INJECTION UNDER ANESTHESIA;  Surgeon: [name], MD;  Location: [place];  Service: Orthopedics;  Laterality: Right;  . TOTAL HIP ARTHROPLASTY Left 04/05/2017   Procedure: LEFT TOTAL HIP ARTHROPLASTY ANTERIOR APPROACH;  Surgeon: [name], MD;  Location: [place];  Service: Orthopedics;  Laterality: Left;  . TOTAL HIP ARTHROPLASTY Right 12/19/2018  . TOTAL HIP ARTHROPLASTY Right 12/19/2018   Procedure: RIGHT TOTAL HIP ARTHROPLASTY-DIRECT ANTERIOR APPROACH;  Surgeon: [name], MD;  Location: [place];  Service: Orthopedics;  Laterality: Right;  . TOTAL KNEE ARTHROPLASTY Left 02/24/2020   Procedure: LEFT TOTAL KNEE ARTHROPLASTY;  Surgeon: [name], MD;  Location: [place];  Service: Orthopedics;  Laterality: Left;  . TRANSTHORACIC ECHOCARDIOGRAM  06/2011   Mild concentric LVH. Normal EF with impaired  relaxation. Mildly elevated RV pressures of 30 and 40 mmHg.  Marland Kitchen TRANSTHORACIC ECHOCARDIOGRAM  06/2016   normal LV size and function. EF 60-65% with GRD 2 DD. Mild aortic stenosis. Mild LA dilation. Mild to moderately increased PA pressures (46 mmHg).  . TUBAL LIGATION      There were no vitals filed for this visit.   Subjective Assessment - 03/24/20 1610    Subjective Pt. indicated feeling better after last visit but still having pain at night.  Pt. stated back of knee and also in thigh.    Patient is accompained by: Family member    Limitations Walking;Standing;House hold activities    Patient Stated Goals  Reduce pain.  Wants to walk for exercise, ride a bike.    Currently in Pain? Yes    Pain Score 2     Pain Location Knee    Pain Orientation Left    Pain Descriptors / Indicators Aching;Tightness    Pain Type Surgical pain    Pain Onset 1 to 4 weeks ago    Pain Frequency Intermittent    Aggravating Factors  night time, soreness after exercise.    Pain Relieving Factors pain medicine                             [facility] Adult PT Treatment/Exercise - 03/24/20 0001      Ambulation/Gait   Gait Comments SPC education and practice c cues verbal and CGA 50 ft x 2 c education given on sequencing      Therapeutic Activites    Therapeutic Activities Other Therapeutic Activities      Neuro Re-ed    Neuro Re-ed Details  tandem stance 2 mins each way, 6 inch alt toe tapping c knee flexion focus x 15 bilateral c cGA      Knee/Hip Exercises: Stretches   Gastroc Stretch 5 reps;Other (comment)   15 sec x 5 bilateral incline board     Knee/Hip Exercises: Aerobic   Nustep lvl 5 10 mins      Knee/Hip Exercises: Standing   Other Standing Knee Exercises PF/DF c hand touch on bar 2 x 10      Knee/Hip Exercises: Seated   Long Arc Quad Strengthening;Left   to fatigue   Long Arc Quad Weight 4 lbs.      Manual Therapy   Manual therapy comments Distraction, IR, flexion Lt knee in sitting c contralateral Rt LE extension                       PT Long Term Goals - 03/10/20 1258      PT LONG TERM GOAL #1   Title Patient will demonstrate/report pain at worst less than or equal to 2/10 to facilitate minimal limitation in daily activity secondary to pain symptoms.    Time 8    Period Weeks    Status New    Target Date 05/05/20      PT LONG TERM GOAL #2   Title Patient will demonstrate independent use of home exercise program to facilitate ability to maintain/progress functional gains from skilled physical therapy services.    Time 8    Period Weeks    Status New     Target Date 05/05/20      PT LONG TERM GOAL #3   Title Patient will demonstrate independent ambulation community distances > 300 ft to facilitate community integration at [facility].  Time 8    Period Weeks    Status New    Target Date 05/05/20      PT LONG TERM GOAL #4   Title Patient will demonstrate Lt knee AROM 0-110 degrees to facilitate ability to perform transfers, sitting, ambulation, stair navigation s restriction due to mobility.    Time 8    Period Weeks    Status New    Target Date 05/05/20      PT LONG TERM GOAL #5   Title Patient will demonstrate Bilateral LE MMT measured at 5/5 throughout to facilitate ability to perform usual standing, walking, stairs at PLOF s limitation due to symptoms.    Time 8    Period Weeks    Status New    Target Date 05/05/20                 Plan - 03/24/20 1638    Clinical Impression Statement Improved participation readiness today overall.  SPC use c CGA at this time but c good sequencing and step through gait cycle.  Not quite ready for home use but perhaps after next visit.  making gains overall in all areas.    Personal Factors and Comorbidities Comorbidity 3+    Comorbidities bilateral THA, most recent 08/2019.  Chronic renal disease, HTN, obesity    Examination-Activity Limitations Transfers;Locomotion Level;Squat;Stairs;Stand;Sit;Bed Mobility;Bend;Carry    Stability/Clinical Decision Making Evolving/Moderate complexity    Rehab Potential --   fair to good   PT Frequency 2x / week    PT Duration 8 weeks    PT Treatment/Interventions ADLs/Self Care Home Management;Electrical Stimulation;Iontophoresis 4mg /ml Dexamethasone;Moist Heat;Traction;Balance training;Therapeutic exercise;Therapeutic activities;Functional mobility training;Stair training;Gait training;Ultrasound;Neuromuscular re-education;Patient/family education;Wheelchair mobility training;Manual techniques;Manual lymph drainage;Vasopneumatic Device;Taping;Dry  needling;Passive range of motion;Spinal Manipulations;Joint Manipulations    PT Next Visit Plan Progress mobility, strength and balance for progressive mobility. SPC use in clinic    PT Home Exercise Plan [id number]    Consulted and Agree with Plan of Care Patient           Patient will benefit from skilled therapeutic intervention in order to improve the following deficits and impairments:  Abnormal gait, Decreased endurance, Hypomobility, Increased edema, Decreased activity tolerance, Decreased strength, Pain, Difficulty walking, Decreased mobility, Decreased balance, Decreased range of motion, Impaired perceived functional ability, Impaired flexibility, Decreased coordination  Visit Diagnosis: Chronic pain of left knee  Muscle weakness (generalized)  Difficulty in walking, not elsewhere classified  Localized edema  Stiffness of left knee, not elsewhere classified     Problem List Patient Active Problem List   Diagnosis Date Noted  . S/P total knee arthroplasty, left 03/09/2020  . History of total hip arthroplasty, right 08/21/2019  . Intermittent claudication ([place]) 07/08/2019  . Hypertensive nephropathy 12/15/2018  . Chronic renal disease, stage II 12/15/2018  . H/O total hip arthroplasty, left 11/07/2017  . Presence of left artificial hip joint 05/14/2017  . Preop cardiovascular exam 03/12/2017  . Cough   . Abdominal pain   . Symptomatic cholelithiasis 07/01/2016  . Nonspecific ST-T wave electrocardiographic changes   . Lower leg edema 02/16/2015  . Obesity (BMI 30.0-34.9) 02/16/2015  . Essential hypertension 01/11/2012  . Hyperthyroidism 01/11/2012  . Hypercholesteremia 01/11/2012  . H/O vaginal hysterectomy 1974 01/11/2012    Class: History of  . S/P removal of lung  partial R lobe  2010 01/11/2012  . History of hernia repair  R ing. 1975 01/11/2012  . BACK PAIN 11/12/2007  . DYSPNEA 11/11/2007    Scot Jun,  PT, DPT, OCS, ATC 03/24/20  4:46  PM    [facility] [address] [place], [place], [address] Phone: [phone]   Fax:  [phone]  Name: [name] MRN: [id number] Date of Birth: 10-Dec-1932

## 2020-03-24 NOTE — Telephone Encounter (Signed)
Call in ultram # 30  one po tid prn pain thanks

## 2020-03-25 ENCOUNTER — Telehealth: Payer: Self-pay | Admitting: Orthopaedic Surgery

## 2020-03-25 MED ORDER — TRAMADOL HCL 50 MG PO TABS: 50.0000 mg | ORAL_TABLET | Freq: Three times a day (TID) | ORAL | 0 refills | Status: DC | PRN

## 2020-03-25 NOTE — Telephone Encounter (Signed)
Pt called wanting to know if she's still supposed to be taking her muscle relaxer.   [phone]

## 2020-03-25 NOTE — Telephone Encounter (Signed)
No , time to stop . thanks

## 2020-03-25 NOTE — Telephone Encounter (Signed)
Y[place] pt

## 2020-03-25 NOTE — Telephone Encounter (Signed)
Please advise 

## 2020-03-25 NOTE — Telephone Encounter (Signed)
Patient called and updated on Ultram called into her pharmacy and discussed Tylenol. Thank you.

## 2020-03-25 NOTE — Telephone Encounter (Signed)
Called in to pharmacy. I did not call patient to advise because I was unsure if you wanted to discuss tylenol in addition with her. If you would like for me to call her, please let me know. Thanks.

## 2020-03-25 NOTE — Telephone Encounter (Signed)
IC s/w patient and advised  

## 2020-03-28 DIAGNOSIS — Z471 Aftercare following joint replacement surgery: Secondary | ICD-10-CM | POA: Diagnosis not present

## 2020-03-29 ENCOUNTER — Ambulatory Visit (INDEPENDENT_AMBULATORY_CARE_PROVIDER_SITE_OTHER): Payer: Medicare Other | Admitting: Rehabilitative and Restorative Service Providers"

## 2020-03-29 ENCOUNTER — Other Ambulatory Visit: Payer: Self-pay

## 2020-03-29 ENCOUNTER — Encounter: Payer: Self-pay | Admitting: Rehabilitative and Restorative Service Providers"

## 2020-03-29 DIAGNOSIS — M6281 Muscle weakness (generalized): Secondary | ICD-10-CM

## 2020-03-29 DIAGNOSIS — M25562 Pain in left knee: Secondary | ICD-10-CM

## 2020-03-29 DIAGNOSIS — G8929 Other chronic pain: Secondary | ICD-10-CM

## 2020-03-29 DIAGNOSIS — M25662 Stiffness of left knee, not elsewhere classified: Secondary | ICD-10-CM

## 2020-03-29 DIAGNOSIS — R262 Difficulty in walking, not elsewhere classified: Secondary | ICD-10-CM | POA: Diagnosis not present

## 2020-03-29 DIAGNOSIS — R600 Localized edema: Secondary | ICD-10-CM | POA: Diagnosis not present

## 2020-03-29 NOTE — Therapy (Signed)
[place], [place], [address] Phone: [phone]   Fax:  [phone]  Physical Therapy Treatment  Patient Details  Name: [name] MRN: [id number] Date of Birth: 03/12/1933 Referring Provider (PT): Dr. Lorin Mercy   Encounter Date: 03/29/2020   PT End of Session - 03/29/20 1607    Visit Number 5    Number of Visits 16    Date for PT Re-Evaluation 05/05/20    Authorization Time Period POC 03/10/2020 - 05/05/2020    Authorization - Visit Number 5    Progress Note Due on Visit 10    PT Start Time 1556    PT Stop Time 1635    PT Time Calculation (min) 39 min    Activity Tolerance Patient limited by fatigue    Behavior During Therapy [facility] for tasks assessed/performed           Past Medical History:  Diagnosis Date  . Aortic stenosis    Echo 07/05/16: Very mild AS, Mean gradient 10 mm Hg, Peak gradient (S) 19 mmHg   . Arthritis    left hip and back  . Bronchiectasis    isolated to RML; status post right middle lobe partial resection.  . Complication of anesthesia    hard to wake up  . Dyspnea   . Gall stones   . GERD (gastroesophageal reflux disease)    history   . H/O osteoporosis   . Hypertension   . Hyperthyroidism    endocrinologist - Dr. Chalmers Cater  . Pneumonia   . Yeast infection     Past Surgical History:  Procedure Laterality Date  . ABDOMINAL HYSTERECTOMY  1974  . CHOLECYSTECTOMY N/A 07/02/2016   Procedure: LAPAROSCOPIC CHOLECYSTECTOMY;  Surgeon: Stark Klein, MD;  Location: [place];  Service: General;  Laterality: N/A;  . COLONOSCOPY    . ENDOSCOPIC RETROGRADE CHOLANGIOPANCREATOGRAPHY (ERCP) WITH PROPOFOL N/A 08/07/2018   Procedure: ENDOSCOPIC RETROGRADE CHOLANGIOPANCREATOGRAPHY (ERCP) WITH PROPOFOL;  Surgeon: Carol Ada, MD;  [facility];  Service: Endoscopy;  Laterality: N/A;  . EYE SURGERY     stye removed  . HARDWARE REMOVAL Left 02/24/2020   Procedure: Hardware Removal, left knee;   Surgeon: [name], MD;  Location: [place];  Service: Orthopedics;  Laterality: Left;  . HERNIA REPAIR  1975  . KNEE SURGERY Left   . LUNG REMOVAL, PARTIAL  208   RML  . NM MYOVIEW LTD  06/2011   Normal EF. No ischemia or infarction.  [name]  08/07/2018   Procedure: [name];  Surgeon: Carol Ada, MD;  [facility];  Service: Endoscopy;;  balloon sweep  . STERIOD INJECTION Right 02/24/2020   Procedure: RIGHT KNEE STEROID INTRA-ARTICULAR MARCAINE/DEPO-MEDROL INJECTION UNDER ANESTHESIA;  Surgeon: [name], MD;  Location: [place];  Service: Orthopedics;  Laterality: Right;  . TOTAL HIP ARTHROPLASTY Left 04/05/2017   Procedure: LEFT TOTAL HIP ARTHROPLASTY ANTERIOR APPROACH;  Surgeon: [name], MD;  Location: [place];  Service: Orthopedics;  Laterality: Left;  . TOTAL HIP ARTHROPLASTY Right 12/19/2018  . TOTAL HIP ARTHROPLASTY Right 12/19/2018   Procedure: RIGHT TOTAL HIP ARTHROPLASTY-DIRECT ANTERIOR APPROACH;  Surgeon: [name], MD;  Location: [place];  Service: Orthopedics;  Laterality: Right;  . TOTAL KNEE ARTHROPLASTY Left 02/24/2020   Procedure: LEFT TOTAL KNEE ARTHROPLASTY;  Surgeon: [name], MD;  Location: [place];  Service: Orthopedics;  Laterality: Left;  . TRANSTHORACIC ECHOCARDIOGRAM  06/2011   Mild concentric LVH. Normal EF with impaired  relaxation. Mildly elevated RV pressures of 30 and 40 mmHg.  Marland Kitchen TRANSTHORACIC ECHOCARDIOGRAM  06/2016   normal LV size and function. EF 60-65% with GRD 2 DD. Mild aortic stenosis. Mild LA dilation. Mild to moderately increased PA pressures (46 mmHg).  . TUBAL LIGATION      There were no vitals filed for this visit.   Subjective Assessment - 03/29/20 1605    Subjective Pt. indicated no specific pain at rest upon arrival today.  She indicated having some pain at rest yesterday, maybe due to cooking on Sunday.  Tightness may complaint today.    Patient is accompained by: Family member    Limitations  Walking;Standing;House hold activities    Patient Stated Goals Reduce pain.  Wants to walk for exercise, ride a bike.    Currently in Pain? No/denies    Pain Score 0-No pain    Pain Location Knee    Pain Orientation Left    Pain Descriptors / Indicators Tightness    Pain Type Surgical pain    Pain Onset 1 to 4 weeks ago    Pain Frequency Intermittent    Aggravating Factors  resting at times, tightness c movement.    Pain Relieving Factors pain medicine, rest              [facility] PT Assessment - 03/29/20 0001      AROM   Left Knee Flexion 93                         [facility] Adult PT Treatment/Exercise - 03/29/20 0001      Ambulation/Gait   Gait Comments SPC in Rt UE use 100 ft x 2      Neuro Re-ed    Neuro Re-ed Details  retro step 20x bilateral      Knee/Hip Exercises: Stretches   Gastroc Stretch 3 reps;30 seconds      Knee/Hip Exercises: Aerobic   Nustep Lvl 4 10 mins      Knee/Hip Exercises: Standing   Forward Step Up 10 reps;Both;Step Height: 4";Other (comment);Hand Hold: 2      Knee/Hip Exercises: Seated   Long Arc Quad Strengthening;Left;Other (comment)   3 x10   Long Arc Quad Weight 5 lbs.    Sit to Sand without UE support   x10     Manual Therapy   Manual therapy comments Distraction, IR, flexion Lt knee in sitting c contralateral Rt LE extension                       PT Long Term Goals - 03/29/20 1606      PT LONG TERM GOAL #1   Title Patient will demonstrate/report pain at worst less than or equal to 2/10 to facilitate minimal limitation in daily activity secondary to pain symptoms.    Time 8    Period Weeks    Status On-going    Target Date 05/05/20      PT LONG TERM GOAL #2   Title Patient will demonstrate independent use of home exercise program to facilitate ability to maintain/progress functional gains from skilled physical therapy services.    Time 8    Period Weeks    Status On-going    Target Date 05/05/20       PT LONG TERM GOAL #3   Title Patient will demonstrate independent ambulation community distances > 300 ft to facilitate community integration at [facility].    Time 8  Period Weeks    Status On-going    Target Date 05/05/20      PT LONG TERM GOAL #4   Title Patient will demonstrate Lt knee AROM 0-110 degrees to facilitate ability to perform transfers, sitting, ambulation, stair navigation s restriction due to mobility.    Time 8    Period Weeks    Status On-going    Target Date 05/05/20      PT LONG TERM GOAL #5   Title Patient will demonstrate Bilateral LE MMT measured at 5/5 throughout to facilitate ability to perform usual standing, walking, stairs at PLOF s limitation due to symptoms.    Time 8    Period Weeks    Status On-going    Target Date 05/05/20                 Plan - 03/29/20 1631    Clinical Impression Statement Progressed ability to perform [facility] use in clinic on level surfaces c supervision and occasional cues for cane placement.  Pt. showing gains in WB acceptance overall on Lt LE but strength and balance control lacking compared to norms.    Personal Factors and Comorbidities Comorbidity 3+    Comorbidities bilateral THA, most recent 08/2019.  Chronic renal disease, HTN, obesity    Examination-Activity Limitations Transfers;Locomotion Level;Squat;Stairs;Stand;Sit;Bed Mobility;Bend;Carry    Stability/Clinical Decision Making Evolving/Moderate complexity    Rehab Potential --   fair to good   PT Frequency 2x / week    PT Duration 8 weeks    PT Treatment/Interventions ADLs/Self Care Home Management;Electrical Stimulation;Iontophoresis 4mg /ml Dexamethasone;Moist Heat;Traction;Balance training;Therapeutic exercise;Therapeutic activities;Functional mobility training;Stair training;Gait training;Ultrasound;Neuromuscular re-education;Patient/family education;Wheelchair mobility training;Manual techniques;Manual lymph drainage;Vasopneumatic Device;Taping;Dry needling;Passive  range of motion;Spinal Manipulations;Joint Manipulations    PT Next Visit Plan Continue SPC use, strengthening/mobility for Lt knee, balance control in WB    PT Home Exercise Plan [id number]    Consulted and Agree with Plan of Care Patient           Patient will benefit from skilled therapeutic intervention in order to improve the following deficits and impairments:  Abnormal gait, Decreased endurance, Hypomobility, Increased edema, Decreased activity tolerance, Decreased strength, Pain, Difficulty walking, Decreased mobility, Decreased balance, Decreased range of motion, Impaired perceived functional ability, Impaired flexibility, Decreased coordination  Visit Diagnosis: Chronic pain of left knee  Muscle weakness (generalized)  Difficulty in walking, not elsewhere classified  Localized edema  Stiffness of left knee, not elsewhere classified     Problem List Patient Active Problem List   Diagnosis Date Noted  . S/P total knee arthroplasty, left 03/09/2020  . History of total hip arthroplasty, right 08/21/2019  . Intermittent claudication ([place]) 07/08/2019  . Hypertensive nephropathy 12/15/2018  . Chronic renal disease, stage II 12/15/2018  . H/O total hip arthroplasty, left 11/07/2017  . Presence of left artificial hip joint 05/14/2017  . Preop cardiovascular exam 03/12/2017  . Cough   . Abdominal pain   . Symptomatic cholelithiasis 07/01/2016  . Nonspecific ST-T wave electrocardiographic changes   . Lower leg edema 02/16/2015  . Obesity (BMI 30.0-34.9) 02/16/2015  . Essential hypertension 01/11/2012  . Hyperthyroidism 01/11/2012  . Hypercholesteremia 01/11/2012  . H/O vaginal hysterectomy 1974 01/11/2012    Class: History of  . S/P removal of lung  partial R lobe  2010 01/11/2012  . History of hernia repair  R ing. 1975 01/11/2012  . BACK PAIN 11/12/2007  . DYSPNEA 11/11/2007    Scot Jun, PT, DPT, OCS, ATC 03/29/20  4:34 PM  [facility]  Physical Therapy [address] [place], [place], [address] Phone: [phone]   Fax:  [phone]  Name: [name] MRN: [id number] Date of Birth: 07/30/1933

## 2020-03-31 ENCOUNTER — Ambulatory Visit (INDEPENDENT_AMBULATORY_CARE_PROVIDER_SITE_OTHER): Payer: Medicare Other | Admitting: Physical Therapy

## 2020-03-31 ENCOUNTER — Other Ambulatory Visit: Payer: Self-pay

## 2020-03-31 ENCOUNTER — Telehealth: Payer: Self-pay | Admitting: *Deleted

## 2020-03-31 DIAGNOSIS — M25562 Pain in left knee: Secondary | ICD-10-CM

## 2020-03-31 DIAGNOSIS — M6281 Muscle weakness (generalized): Secondary | ICD-10-CM

## 2020-03-31 DIAGNOSIS — R600 Localized edema: Secondary | ICD-10-CM

## 2020-03-31 DIAGNOSIS — R262 Difficulty in walking, not elsewhere classified: Secondary | ICD-10-CM

## 2020-03-31 DIAGNOSIS — M25662 Stiffness of left knee, not elsewhere classified: Secondary | ICD-10-CM

## 2020-03-31 DIAGNOSIS — G8929 Other chronic pain: Secondary | ICD-10-CM

## 2020-03-31 NOTE — Telephone Encounter (Signed)
Patient called requesting refill of pain medication (Ultram). She states she is not out, but just wants them available for over the weekend if needed. Thanks.

## 2020-03-31 NOTE — Telephone Encounter (Signed)
Ok thanks 

## 2020-03-31 NOTE — Therapy (Signed)
[facility] [place] [place], [place], [address] Phone: [phone]   Fax:  [phone]  Physical Therapy Treatment  Patient Details  Name: [name] MRN: [id number] Date of Birth: 1933/08/25 Referring Provider (PT): Dr. Lorin Mercy   Encounter Date: 03/31/2020   PT End of Session - 03/31/20 1647    Visit Number 6    Number of Visits 16    Date for PT Re-Evaluation 05/05/20    Authorization Time Period POC 03/10/2020 - 05/05/2020    Authorization - Visit Number 6    Progress Note Due on Visit 10    PT Start Time 1605    PT Stop Time 1645    PT Time Calculation (min) 40 min    Activity Tolerance Patient limited by fatigue    Behavior During Therapy [facility] for tasks assessed/performed           Past Medical History:  Diagnosis Date  . Aortic stenosis    Echo 07/05/16: Very mild AS, Mean gradient 10 mm Hg, Peak gradient (S) 19 mmHg   . Arthritis    left hip and back  . Bronchiectasis    isolated to RML; status post right middle lobe partial resection.  . Complication of anesthesia    hard to wake up  . Dyspnea   . Gall stones   . GERD (gastroesophageal reflux disease)    history   . H/O osteoporosis   . Hypertension   . Hyperthyroidism    endocrinologist - Dr. Chalmers Cater  . Pneumonia   . Yeast infection     Past Surgical History:  Procedure Laterality Date  . ABDOMINAL HYSTERECTOMY  1974  . CHOLECYSTECTOMY N/A 07/02/2016   Procedure: LAPAROSCOPIC CHOLECYSTECTOMY;  Surgeon: Stark Klein, MD;  Location: [place];  Service: General;  Laterality: N/A;  . COLONOSCOPY    . ENDOSCOPIC RETROGRADE CHOLANGIOPANCREATOGRAPHY (ERCP) WITH PROPOFOL N/A 08/07/2018   Procedure: ENDOSCOPIC RETROGRADE CHOLANGIOPANCREATOGRAPHY (ERCP) WITH PROPOFOL;  Surgeon: Carol Ada, MD;  [facility];  Service: Endoscopy;  Laterality: N/A;  . EYE SURGERY     stye removed  . HARDWARE REMOVAL Left 02/24/2020   Procedure: Hardware Removal, left knee;   Surgeon: [name], MD;  Location: [place];  Service: Orthopedics;  Laterality: Left;  . HERNIA REPAIR  1975  . KNEE SURGERY Left   . LUNG REMOVAL, PARTIAL  208   RML  . NM MYOVIEW LTD  06/2011   Normal EF. No ischemia or infarction.  [name]  08/07/2018   Procedure: [name];  Surgeon: Carol Ada, MD;  [facility];  Service: Endoscopy;;  balloon sweep  . STERIOD INJECTION Right 02/24/2020   Procedure: RIGHT KNEE STEROID INTRA-ARTICULAR MARCAINE/DEPO-MEDROL INJECTION UNDER ANESTHESIA;  Surgeon: [name], MD;  Location: [place];  Service: Orthopedics;  Laterality: Right;  . TOTAL HIP ARTHROPLASTY Left 04/05/2017   Procedure: LEFT TOTAL HIP ARTHROPLASTY ANTERIOR APPROACH;  Surgeon: [name], MD;  Location: [place];  Service: Orthopedics;  Laterality: Left;  . TOTAL HIP ARTHROPLASTY Right 12/19/2018  . TOTAL HIP ARTHROPLASTY Right 12/19/2018   Procedure: RIGHT TOTAL HIP ARTHROPLASTY-DIRECT ANTERIOR APPROACH;  Surgeon: [name], MD;  Location: [place];  Service: Orthopedics;  Laterality: Right;  . TOTAL KNEE ARTHROPLASTY Left 02/24/2020   Procedure: LEFT TOTAL KNEE ARTHROPLASTY;  Surgeon: [name], MD;  Location: [place];  Service: Orthopedics;  Laterality: Left;  . TRANSTHORACIC ECHOCARDIOGRAM  06/2011   Mild concentric LVH. Normal EF with impaired  relaxation. Mildly elevated RV pressures of 30 and 40 mmHg.  Marland Kitchen TRANSTHORACIC ECHOCARDIOGRAM  06/2016   normal LV size and function. EF 60-65% with GRD 2 DD. Mild aortic stenosis. Mild LA dilation. Mild to moderately increased PA pressures (46 mmHg).  . TUBAL LIGATION      There were no vitals filed for this visit.   Subjective Assessment - 03/31/20 1626    Subjective about 4/10 pain in her Lt knee today    Patient is accompained by: Family member    Limitations Walking;Standing;House hold activities    Patient Stated Goals Reduce pain.  Wants to walk for exercise, ride a bike.    Pain Onset 1 to 4 weeks  ago             [facility] Adult PT Treatment/Exercise - 03/31/20 0001      Ambulation/Gait   Gait Comments SPC in Rt UE use 150 ft, then 100 ft      Knee/Hip Exercises: Stretches   Active Hamstring Stretch Left;2 reps;30 seconds    Gastroc Stretch 2 reps;30 seconds      Knee/Hip Exercises: Aerobic   Nustep Lvl 4 10 mins      Knee/Hip Exercises: Standing   Heel Raises Both;15 reps    Hip Flexion Both;2 sets;10 reps    Hip Flexion Limitations 5 lbs    Forward Step Up 10 reps;Both;Step Height: 4";Other (comment);Hand Hold: 2      Knee/Hip Exercises: Seated   Long Arc Quad Both;3 sets;10 reps    Long Arc Quad Weight 5 lbs.    Sit to [facility] without UE support      Manual Therapy   Manual therapy comments Distraction, IR, flexion Lt knee in sitting c contralateral Rt LE extension                       PT Long Term Goals - 03/29/20 1606      PT LONG TERM GOAL #1   Title Patient will demonstrate/report pain at worst less than or equal to 2/10 to facilitate minimal limitation in daily activity secondary to pain symptoms.    Time 8    Period Weeks    Status On-going    Target Date 05/05/20      PT LONG TERM GOAL #2   Title Patient will demonstrate independent use of home exercise program to facilitate ability to maintain/progress functional gains from skilled physical therapy services.    Time 8    Period Weeks    Status On-going    Target Date 05/05/20      PT LONG TERM GOAL #3   Title Patient will demonstrate independent ambulation community distances > 300 ft to facilitate community integration at [facility].    Time 8    Period Weeks    Status On-going    Target Date 05/05/20      PT LONG TERM GOAL #4   Title Patient will demonstrate Lt knee AROM 0-110 degrees to facilitate ability to perform transfers, sitting, ambulation, stair navigation s restriction due to mobility.    Time 8    Period Weeks    Status On-going    Target Date 05/05/20      PT LONG TERM  GOAL #5   Title Patient will demonstrate Bilateral LE MMT measured at 5/5 throughout to facilitate ability to perform usual standing, walking, stairs at PLOF s limitation due to symptoms.    Time 8    Period Weeks  Status On-going    Target Date 05/05/20                 Plan - 03/31/20 1648    Clinical Impression Statement Continued with gait using SPC in clinic today and able to perform with supervision for incresaed distance. Continued with knee ROM and strengthening with good overall tolerance. PT will continue to progress her as able toward her functional goals.    Personal Factors and Comorbidities Comorbidity 3+    Comorbidities bilateral THA, most recent 08/2019.  Chronic renal disease, HTN, obesity    Examination-Activity Limitations Transfers;Locomotion Level;Squat;Stairs;Stand;Sit;Bed Mobility;Bend;Carry    Stability/Clinical Decision Making Evolving/Moderate complexity    Rehab Potential --   fair to good   PT Frequency 2x / week    PT Duration 8 weeks    PT Treatment/Interventions ADLs/Self Care Home Management;Electrical Stimulation;Iontophoresis 4mg /ml Dexamethasone;Moist Heat;Traction;Balance training;Therapeutic exercise;Therapeutic activities;Functional mobility training;Stair training;Gait training;Ultrasound;Neuromuscular re-education;Patient/family education;Wheelchair mobility training;Manual techniques;Manual lymph drainage;Vasopneumatic Device;Taping;Dry needling;Passive range of motion;Spinal Manipulations;Joint Manipulations    PT Next Visit Plan Continue SPC use, strengthening/mobility for Lt knee, balance control in WB    PT Home Exercise Plan [id number]    Consulted and Agree with Plan of Care Patient           Patient will benefit from skilled therapeutic intervention in order to improve the following deficits and impairments:  Abnormal gait, Decreased endurance, Hypomobility, Increased edema, Decreased activity tolerance, Decreased strength, Pain,  Difficulty walking, Decreased mobility, Decreased balance, Decreased range of motion, Impaired perceived functional ability, Impaired flexibility, Decreased coordination  Visit Diagnosis: Chronic pain of left knee  Muscle weakness (generalized)  Difficulty in walking, not elsewhere classified  Localized edema  Stiffness of left knee, not elsewhere classified     Problem List Patient Active Problem List   Diagnosis Date Noted  . S/P total knee arthroplasty, left 03/09/2020  . History of total hip arthroplasty, right 08/21/2019  . Intermittent claudication ([place]) 07/08/2019  . Hypertensive nephropathy 12/15/2018  . Chronic renal disease, stage II 12/15/2018  . H/O total hip arthroplasty, left 11/07/2017  . Presence of left artificial hip joint 05/14/2017  . Preop cardiovascular exam 03/12/2017  . Cough   . Abdominal pain   . Symptomatic cholelithiasis 07/01/2016  . Nonspecific ST-T wave electrocardiographic changes   . Lower leg edema 02/16/2015  . Obesity (BMI 30.0-34.9) 02/16/2015  . Essential hypertension 01/11/2012  . Hyperthyroidism 01/11/2012  . Hypercholesteremia 01/11/2012  . H/O vaginal hysterectomy 1974 01/11/2012    Class: History of  . S/P removal of lung  partial R lobe  2010 01/11/2012  . History of hernia repair  R ing. 1975 01/11/2012  . BACK PAIN 11/12/2007  . DYSPNEA 11/11/2007    Silvestre Mesi 03/31/2020, 4:49 PM  [facility] Physical Therapy [address] [place], [place], [address] Phone: [phone]   Fax:  [phone]  Name: [name] MRN: [id number] Date of Birth: [birth year]

## 2020-03-31 NOTE — Telephone Encounter (Signed)
Please advise 

## 2020-04-01 MED ORDER — TRAMADOL HCL 50 MG PO TABS: 50.0000 mg | ORAL_TABLET | Freq: Three times a day (TID) | ORAL | 0 refills | Status: DC | PRN

## 2020-04-01 NOTE — Telephone Encounter (Signed)
Called to pharmacy. I called patient and advised. 

## 2020-04-05 ENCOUNTER — Other Ambulatory Visit: Payer: Self-pay

## 2020-04-05 ENCOUNTER — Encounter: Payer: Self-pay | Admitting: Rehabilitative and Restorative Service Providers"

## 2020-04-05 ENCOUNTER — Ambulatory Visit (INDEPENDENT_AMBULATORY_CARE_PROVIDER_SITE_OTHER): Payer: Medicare Other | Admitting: Rehabilitative and Restorative Service Providers"

## 2020-04-05 DIAGNOSIS — R262 Difficulty in walking, not elsewhere classified: Secondary | ICD-10-CM

## 2020-04-05 DIAGNOSIS — M25662 Stiffness of left knee, not elsewhere classified: Secondary | ICD-10-CM

## 2020-04-05 DIAGNOSIS — R600 Localized edema: Secondary | ICD-10-CM | POA: Diagnosis not present

## 2020-04-05 DIAGNOSIS — M25562 Pain in left knee: Secondary | ICD-10-CM

## 2020-04-05 DIAGNOSIS — M6281 Muscle weakness (generalized): Secondary | ICD-10-CM | POA: Diagnosis not present

## 2020-04-05 DIAGNOSIS — G8929 Other chronic pain: Secondary | ICD-10-CM

## 2020-04-05 NOTE — Therapy (Signed)
[facility], [place], [address] Phone: [phone]   Fax:  [phone]  Physical Therapy Treatment  Patient Details  Name: [name] MRN: [id number] Date of Birth: 1933-05-18 Referring Provider (PT): Dr. Lorin Mercy   Encounter Date: 04/05/2020   PT End of Session - 04/05/20 1528    Visit Number 7    Number of Visits 16    Date for PT Re-Evaluation 05/05/20    Authorization Time Period POC 03/10/2020 - 05/05/2020    Authorization - Visit Number 7    Progress Note Due on Visit 10    PT Start Time 1518    PT Stop Time 1600    PT Time Calculation (min) 42 min    Activity Tolerance Patient tolerated treatment well    Behavior During Therapy [facility] for tasks assessed/performed           Past Medical History:  Diagnosis Date  . Aortic stenosis    Echo 07/05/16: Very mild AS, Mean gradient 10 mm Hg, Peak gradient (S) 19 mmHg   . Arthritis    left hip and back  . Bronchiectasis    isolated to RML; status post right middle lobe partial resection.  . Complication of anesthesia    hard to wake up  . Dyspnea   . Gall stones   . GERD (gastroesophageal reflux disease)    history   . H/O osteoporosis   . Hypertension   . Hyperthyroidism    endocrinologist - Dr. Chalmers Cater  . Pneumonia   . Yeast infection     Past Surgical History:  Procedure Laterality Date  . ABDOMINAL HYSTERECTOMY  1974  . CHOLECYSTECTOMY N/A 07/02/2016   Procedure: LAPAROSCOPIC CHOLECYSTECTOMY;  Surgeon: Stark Klein, MD;  Location: [place];  Service: General;  Laterality: N/A;  . COLONOSCOPY    . ENDOSCOPIC RETROGRADE CHOLANGIOPANCREATOGRAPHY (ERCP) WITH PROPOFOL N/A 08/07/2018   Procedure: ENDOSCOPIC RETROGRADE CHOLANGIOPANCREATOGRAPHY (ERCP) WITH PROPOFOL;  Surgeon: Carol Ada, MD;  [facility];  Service: Endoscopy;  Laterality: N/A;  . EYE SURGERY     stye removed  . HARDWARE REMOVAL Left 02/24/2020   Procedure: Hardware Removal, left knee;   Surgeon: [name], MD;  Location: [place];  Service: Orthopedics;  Laterality: Left;  . HERNIA REPAIR  1975  . KNEE SURGERY Left   . LUNG REMOVAL, PARTIAL  208   RML  . NM MYOVIEW LTD  06/2011   Normal EF. No ischemia or infarction.  [name]  08/07/2018   Procedure: [name];  Surgeon: Carol Ada, MD;  [facility];  Service: Endoscopy;;  balloon sweep  . STERIOD INJECTION Right 02/24/2020   Procedure: RIGHT KNEE STEROID INTRA-ARTICULAR MARCAINE/DEPO-MEDROL INJECTION UNDER ANESTHESIA;  Surgeon: [name], MD;  Location: [place];  Service: Orthopedics;  Laterality: Right;  . TOTAL HIP ARTHROPLASTY Left 04/05/2017   Procedure: LEFT TOTAL HIP ARTHROPLASTY ANTERIOR APPROACH;  Surgeon: [name], MD;  Location: [place];  Service: Orthopedics;  Laterality: Left;  . TOTAL HIP ARTHROPLASTY Right 12/19/2018  . TOTAL HIP ARTHROPLASTY Right 12/19/2018   Procedure: RIGHT TOTAL HIP ARTHROPLASTY-DIRECT ANTERIOR APPROACH;  Surgeon: [name], MD;  Location: [place];  Service: Orthopedics;  Laterality: Right;  . TOTAL KNEE ARTHROPLASTY Left 02/24/2020   Procedure: LEFT TOTAL KNEE ARTHROPLASTY;  Surgeon: [name], MD;  Location: [place];  Service: Orthopedics;  Laterality: Left;  . TRANSTHORACIC ECHOCARDIOGRAM  06/2011   Mild concentric LVH. Normal EF with impaired  relaxation. Mildly elevated RV pressures of 30 and 40 mmHg.  Marland Kitchen TRANSTHORACIC ECHOCARDIOGRAM  06/2016   normal LV size and function. EF 60-65% with GRD 2 DD. Mild aortic stenosis. Mild LA dilation. Mild to moderately increased PA pressures (46 mmHg).  . TUBAL LIGATION      There were no vitals filed for this visit.   Subjective Assessment - 04/05/20 1526    Subjective Pt. indicated soreness in knee primarly,  Pt. stated soreness more consistent in last few days.    Patient is accompained by: Family member    Limitations Walking;Standing;House hold activities    Patient Stated Goals Reduce pain.  Wants to  walk for exercise, ride a bike.    Currently in Pain? Yes    Pain Score 4     Pain Location Knee    Pain Orientation Left    Pain Descriptors / Indicators Sore    Pain Type Surgical pain    Pain Onset 1 to 4 weeks ago    Pain Frequency Intermittent    Aggravating Factors  insidious    Pain Relieving Factors icing at times.    Effect of Pain on Daily Activities Prolonged walking.              [facility] PT Assessment - 04/05/20 0001      Assessment   Medical Diagnosis S/P L TKA    Referring Provider (PT) Dr. Lorin Mercy    Onset Date/Surgical Date 02/24/20      AROM   Left Knee Extension -6    Left Knee Flexion 100      PROM   Left Knee Extension -5    Left Knee Flexion 103      Strength   Left Knee Flexion 5/5    Left Knee Extension 4/5                         [facility] Adult PT Treatment/Exercise - 04/05/20 0001      Ambulation/Gait   Gait Comments SPC 100 ft , 50 ft       Neuro Re-ed    Neuro Re-ed Details  fwd BIG inspired stepping x 10 bilateral, tandem stance 1 min bilateral      Knee/Hip Exercises: Aerobic   Nustep Lvl 4 10 mins      Knee/Hip Exercises: Standing   Forward Step Up 15 reps;Step Height: 4";Both      Knee/Hip Exercises: Seated   Other Seated Knee/Hip Exercises seated heel slide x 10    Sit to [place] without UE support;10 reps   18 inch mat     Manual Therapy   Manual therapy comments Distraction, IR, flexion Lt knee in sitting c contralateral Rt LE extension                       PT Long Term Goals - 03/29/20 1606      PT LONG TERM GOAL #1   Title Patient will demonstrate/report pain at worst less than or equal to 2/10 to facilitate minimal limitation in daily activity secondary to pain symptoms.    Time 8    Period Weeks    Status On-going    Target Date 05/05/20      PT LONG TERM GOAL #2   Title Patient will demonstrate independent use of home exercise program to facilitate ability to maintain/progress functional  gains from skilled physical therapy services.    Time 8  Period Weeks    Status On-going    Target Date 05/05/20      PT LONG TERM GOAL #3   Title Patient will demonstrate independent ambulation community distances > 300 ft to facilitate community integration at [facility].    Time 8    Period Weeks    Status On-going    Target Date 05/05/20      PT LONG TERM GOAL #4   Title Patient will demonstrate Lt knee AROM 0-110 degrees to facilitate ability to perform transfers, sitting, ambulation, stair navigation s restriction due to mobility.    Time 8    Period Weeks    Status On-going    Target Date 05/05/20      PT LONG TERM GOAL #5   Title Patient will demonstrate Bilateral LE MMT measured at 5/5 throughout to facilitate ability to perform usual standing, walking, stairs at PLOF s limitation due to symptoms.    Time 8    Period Weeks    Status On-going    Target Date 05/05/20                 Plan - 04/05/20 1552    Clinical Impression Statement Pt. continued to show improvements towards goals overall c measureable gains in active mobility as well as improved ambulation overall.  Pt. to benefit from continued skilled PT services to gain towards full range, strength and improved balance.    Personal Factors and Comorbidities Comorbidity 3+    Comorbidities bilateral THA, most recent 08/2019.  Chronic renal disease, HTN, obesity    Examination-Activity Limitations Transfers;Locomotion Level;Squat;Stairs;Stand;Sit;Bed Mobility;Bend;Carry    Stability/Clinical Decision Making Evolving/Moderate complexity    Rehab Potential --   fair to good   PT Frequency 2x / week    PT Duration 8 weeks    PT Treatment/Interventions ADLs/Self Care Home Management;Electrical Stimulation;Iontophoresis 4mg /ml Dexamethasone;Moist Heat;Traction;Balance training;Therapeutic exercise;Therapeutic activities;Functional mobility training;Stair training;Gait training;Ultrasound;Neuromuscular  re-education;Patient/family education;Wheelchair mobility training;Manual techniques;Manual lymph drainage;Vasopneumatic Device;Taping;Dry needling;Passive range of motion;Spinal Manipulations;Joint Manipulations    PT Next Visit Plan Non complaint balance improvements, strength gains.    PT Home Exercise Plan [id number]    Consulted and Agree with Plan of Care Patient           Patient will benefit from skilled therapeutic intervention in order to improve the following deficits and impairments:  Abnormal gait, Decreased endurance, Hypomobility, Increased edema, Decreased activity tolerance, Decreased strength, Pain, Difficulty walking, Decreased mobility, Decreased balance, Decreased range of motion, Impaired perceived functional ability, Impaired flexibility, Decreased coordination  Visit Diagnosis: Chronic pain of left knee  Muscle weakness (generalized)  Difficulty in walking, not elsewhere classified  Localized edema  Stiffness of left knee, not elsewhere classified     Problem List Patient Active Problem List   Diagnosis Date Noted  . S/P total knee arthroplasty, left 03/09/2020  . History of total hip arthroplasty, right 08/21/2019  . Intermittent claudication ([place]) 07/08/2019  . Hypertensive nephropathy 12/15/2018  . Chronic renal disease, stage II 12/15/2018  . H/O total hip arthroplasty, left 11/07/2017  . Presence of left artificial hip joint 05/14/2017  . Preop cardiovascular exam 03/12/2017  . Cough   . Abdominal pain   . Symptomatic cholelithiasis 07/01/2016  . Nonspecific ST-T wave electrocardiographic changes   . Lower leg edema 02/16/2015  . Obesity (BMI 30.0-34.9) 02/16/2015  . Essential hypertension 01/11/2012  . Hyperthyroidism 01/11/2012  . Hypercholesteremia 01/11/2012  . H/O vaginal hysterectomy 1974 01/11/2012    Class: History of  .  S/P removal of lung  partial R lobe  2010 01/11/2012  . History of hernia repair  R ing. 1975 01/11/2012  . BACK  PAIN 11/12/2007  . DYSPNEA 11/11/2007   Scot Jun, PT, DPT, OCS, ATC 04/05/20  4:04 PM    [facility] [address] [place], [place], [address] Phone: [phone]   Fax:  [phone]  Name: [name] MRN: [id number] Date of Birth: Feb 21, 1933

## 2020-04-06 ENCOUNTER — Ambulatory Visit (INDEPENDENT_AMBULATORY_CARE_PROVIDER_SITE_OTHER): Payer: Medicare Other | Admitting: Orthopaedic Surgery

## 2020-04-06 DIAGNOSIS — Z96652 Presence of left artificial knee joint: Secondary | ICD-10-CM

## 2020-04-06 MED ORDER — TRAMADOL HCL 50 MG PO TABS: 50.0000 mg | ORAL_TABLET | Freq: Three times a day (TID) | ORAL | 0 refills | Status: DC | PRN

## 2020-04-06 NOTE — Progress Notes (Signed)
Post-Op Visit Note   Patient: [name]           Date of Birth: 19-Apr-1933           MRN: [id number] Visit Date: 04/06/2020 PCP: Glendale Chard, MD   Assessment & Plan:  Chief Complaint:  Chief Complaint  Patient presents with  . Left Knee - Pain, Follow-up  . Right Knee - Pain, Follow-up   Visit Diagnoses:  1. S/P total knee arthroplasty, left     Plan: Post left total knee arthroplasty now slightly more than 1 month.  Flexion 100 she lacks 6 degrees reaching full extension we discussed what she needs to do to fix this problem with prone positioning using a book bag or purse with a wide strap on her ankle when she lays in a prone position also discussed letting her knee sag out straight.  She needs to continue strengthening.  Recheck 4 weeks.  Follow-Up Instructions: Return in about 4 weeks (around 05/04/2020).   Orders:  No orders of the defined types were placed in this encounter.  Meds ordered this encounter  Medications  . traMADol (ULTRAM) 50 MG tablet    Sig: Take 1 tablet (50 mg total) by mouth 3 (three) times daily as needed.    Dispense:  30 tablet    Refill:  0    Imaging: No results found.  PMFS History: Patient Active Problem List   Diagnosis Date Noted  . S/P total knee arthroplasty, left 03/09/2020  . History of total hip arthroplasty, right 08/21/2019  . Intermittent claudication ([place]) 07/08/2019  . Hypertensive nephropathy 12/15/2018  . Chronic renal disease, stage II 12/15/2018  . H/O total hip arthroplasty, left 11/07/2017  . Presence of left artificial hip joint 05/14/2017  . Preop cardiovascular exam 03/12/2017  . Cough   . Abdominal pain   . Symptomatic cholelithiasis 07/01/2016  . Nonspecific ST-T wave electrocardiographic changes   . Lower leg edema 02/16/2015  . Obesity (BMI 30.0-34.9) 02/16/2015  . Essential hypertension 01/11/2012  . Hyperthyroidism 01/11/2012  . Hypercholesteremia 01/11/2012  . H/O vaginal hysterectomy 1974  01/11/2012    Class: History of  . S/P removal of lung  partial R lobe  2010 01/11/2012  . History of hernia repair  R ing. 1975 01/11/2012  . BACK PAIN 11/12/2007  . DYSPNEA 11/11/2007   Past Medical History:  Diagnosis Date  . Aortic stenosis    Echo 07/05/16: Very mild AS, Mean gradient 10 mm Hg, Peak gradient (S) 19 mmHg   . Arthritis    left hip and back  . Bronchiectasis    isolated to RML; status post right middle lobe partial resection.  . Complication of anesthesia    hard to wake up  . Dyspnea   . Gall stones   . GERD (gastroesophageal reflux disease)    history   . H/O osteoporosis   . Hypertension   . Hyperthyroidism    endocrinologist - Dr. Chalmers Cater  . Pneumonia   . Yeast infection     Family History  Problem Relation Age of Onset  . Hypertension Mother   . CVA Mother 77  . Heart attack Sister   . Diabetes Sister   . Healthy Father   . Heart attack Brother   . Heart attack Brother     Past Surgical History:  Procedure Laterality Date  . ABDOMINAL HYSTERECTOMY  1974  . CHOLECYSTECTOMY N/A 07/02/2016   Procedure: LAPAROSCOPIC CHOLECYSTECTOMY;  Surgeon: Stark Klein, MD;  Location: [facility];  Service: General;  Laterality: N/A;  . COLONOSCOPY    . ENDOSCOPIC RETROGRADE CHOLANGIOPANCREATOGRAPHY (ERCP) WITH PROPOFOL N/A 08/07/2018   Procedure: ENDOSCOPIC RETROGRADE CHOLANGIOPANCREATOGRAPHY (ERCP) WITH PROPOFOL;  Surgeon: Carol Ada, MD;  Location: [facility];  Service: Endoscopy;  Laterality: N/A;  . EYE SURGERY     stye removed  . HARDWARE REMOVAL Left 02/24/2020   Procedure: Hardware Removal, left knee;  Surgeon: [name], MD;  Location: [place];  Service: Orthopedics;  Laterality: Left;  . HERNIA REPAIR  1975  . KNEE SURGERY Left   . LUNG REMOVAL, PARTIAL  208   RML  . NM MYOVIEW LTD  06/2011   Normal EF. No ischemia or infarction.  [name]  08/07/2018   Procedure: [name];  Surgeon: Carol Ada, MD;  Location: [facility];   Service: Endoscopy;;  balloon sweep  . STERIOD INJECTION Right 02/24/2020   Procedure: RIGHT KNEE STEROID INTRA-ARTICULAR MARCAINE/DEPO-MEDROL INJECTION UNDER ANESTHESIA;  Surgeon: [name], MD;  Location: [place];  Service: Orthopedics;  Laterality: Right;  . TOTAL HIP ARTHROPLASTY Left 04/05/2017   Procedure: LEFT TOTAL HIP ARTHROPLASTY ANTERIOR APPROACH;  Surgeon: [name], MD;  Location: [place];  Service: Orthopedics;  Laterality: Left;  . TOTAL HIP ARTHROPLASTY Right 12/19/2018  . TOTAL HIP ARTHROPLASTY Right 12/19/2018   Procedure: RIGHT TOTAL HIP ARTHROPLASTY-DIRECT ANTERIOR APPROACH;  Surgeon: [name], MD;  Location: [place];  Service: Orthopedics;  Laterality: Right;  . TOTAL KNEE ARTHROPLASTY Left 02/24/2020   Procedure: LEFT TOTAL KNEE ARTHROPLASTY;  Surgeon: [name], MD;  Location: [place];  Service: Orthopedics;  Laterality: Left;  . TRANSTHORACIC ECHOCARDIOGRAM  06/2011   Mild concentric LVH. Normal EF with impaired relaxation. Mildly elevated RV pressures of 30 and 40 mmHg.  Marland Kitchen TRANSTHORACIC ECHOCARDIOGRAM  06/2016   normal LV size and function. EF 60-65% with GRD 2 DD. Mild aortic stenosis. Mild LA dilation. Mild to moderately increased PA pressures (46 mmHg).  . TUBAL LIGATION     Social History   Occupational History  . Occupation: retired  Tobacco Use  . Smoking status: Never Smoker  . Smokeless tobacco: Never Used  Vaping Use  . Vaping Use: Never used  Substance and Sexual Activity  . Alcohol use: No  . Drug use: No  . Sexual activity: Not Currently

## 2020-04-07 ENCOUNTER — Ambulatory Visit (INDEPENDENT_AMBULATORY_CARE_PROVIDER_SITE_OTHER): Payer: Medicare Other | Admitting: Rehabilitative and Restorative Service Providers"

## 2020-04-07 ENCOUNTER — Other Ambulatory Visit: Payer: Self-pay

## 2020-04-07 ENCOUNTER — Encounter: Payer: Self-pay | Admitting: Rehabilitative and Restorative Service Providers"

## 2020-04-07 DIAGNOSIS — M25562 Pain in left knee: Secondary | ICD-10-CM | POA: Diagnosis not present

## 2020-04-07 DIAGNOSIS — M25662 Stiffness of left knee, not elsewhere classified: Secondary | ICD-10-CM

## 2020-04-07 DIAGNOSIS — G8929 Other chronic pain: Secondary | ICD-10-CM

## 2020-04-07 DIAGNOSIS — M6281 Muscle weakness (generalized): Secondary | ICD-10-CM | POA: Diagnosis not present

## 2020-04-07 DIAGNOSIS — R600 Localized edema: Secondary | ICD-10-CM

## 2020-04-07 DIAGNOSIS — R262 Difficulty in walking, not elsewhere classified: Secondary | ICD-10-CM

## 2020-04-07 NOTE — Therapy (Signed)
[facility] [place], [place], [address] Phone: [phone]   Fax:  [phone]  Physical Therapy Treatment  Patient Details  Name: [name] MRN: [id number] Date of Birth: 1933-03-11 Referring Provider (PT): Dr. Lorin Mercy   Encounter Date: 04/07/2020   PT End of Session - 04/07/20 1612    Visit Number 8    Number of Visits 16    Date for PT Re-Evaluation 05/05/20    Authorization Time Period POC 03/10/2020 - 05/05/2020    Authorization - Visit Number 8    Progress Note Due on Visit 10    PT Start Time 1558    PT Stop Time 1638    PT Time Calculation (min) 40 min    Activity Tolerance Patient tolerated treatment well    Behavior During Therapy [facility] for tasks assessed/performed           Past Medical History:  Diagnosis Date  . Aortic stenosis    Echo 07/05/16: Very mild AS, Mean gradient 10 mm Hg, Peak gradient (S) 19 mmHg   . Arthritis    left hip and back  . Bronchiectasis    isolated to RML; status post right middle lobe partial resection.  . Complication of anesthesia    hard to wake up  . Dyspnea   . Gall stones   . GERD (gastroesophageal reflux disease)    history   . H/O osteoporosis   . Hypertension   . Hyperthyroidism    endocrinologist - Dr. Chalmers Cater  . Pneumonia   . Yeast infection     Past Surgical History:  Procedure Laterality Date  . ABDOMINAL HYSTERECTOMY  1974  . CHOLECYSTECTOMY N/A 07/02/2016   Procedure: LAPAROSCOPIC CHOLECYSTECTOMY;  Surgeon: Stark Klein, MD;  Location: [place];  Service: General;  Laterality: N/A;  . COLONOSCOPY    . ENDOSCOPIC RETROGRADE CHOLANGIOPANCREATOGRAPHY (ERCP) WITH PROPOFOL N/A 08/07/2018   Procedure: ENDOSCOPIC RETROGRADE CHOLANGIOPANCREATOGRAPHY (ERCP) WITH PROPOFOL;  Surgeon: Carol Ada, MD;  [facility];  Service: Endoscopy;  Laterality: N/A;  . EYE SURGERY     stye removed  . HARDWARE REMOVAL Left 02/24/2020   Procedure: Hardware Removal, left knee;   Surgeon: [name], MD;  Location: [place];  Service: Orthopedics;  Laterality: Left;  . HERNIA REPAIR  1975  . KNEE SURGERY Left   . LUNG REMOVAL, PARTIAL  208   RML  . NM MYOVIEW LTD  06/2011   Normal EF. No ischemia or infarction.  [name]  08/07/2018   Procedure: [name];  Surgeon: Carol Ada, MD;  [facility];  Service: Endoscopy;;  balloon sweep  . STERIOD INJECTION Right 02/24/2020   Procedure: RIGHT KNEE STEROID INTRA-ARTICULAR MARCAINE/DEPO-MEDROL INJECTION UNDER ANESTHESIA;  Surgeon: [name], MD;  Location: [place];  Service: Orthopedics;  Laterality: Right;  . TOTAL HIP ARTHROPLASTY Left 04/05/2017   Procedure: LEFT TOTAL HIP ARTHROPLASTY ANTERIOR APPROACH;  Surgeon: [name], MD;  Location: [place];  Service: Orthopedics;  Laterality: Left;  . TOTAL HIP ARTHROPLASTY Right 12/19/2018  . TOTAL HIP ARTHROPLASTY Right 12/19/2018   Procedure: RIGHT TOTAL HIP ARTHROPLASTY-DIRECT ANTERIOR APPROACH;  Surgeon: [name], MD;  Location: [place];  Service: Orthopedics;  Laterality: Right;  . TOTAL KNEE ARTHROPLASTY Left 02/24/2020   Procedure: LEFT TOTAL KNEE ARTHROPLASTY;  Surgeon: [name], MD;  Location: [place];  Service: Orthopedics;  Laterality: Left;  . TRANSTHORACIC ECHOCARDIOGRAM  06/2011   Mild concentric LVH. Normal EF with impaired  relaxation. Mildly elevated RV pressures of 30 and 40 mmHg.  Marland Kitchen TRANSTHORACIC ECHOCARDIOGRAM  06/2016   normal LV size and function. EF 60-65% with GRD 2 DD. Mild aortic stenosis. Mild LA dilation. Mild to moderately increased PA pressures (46 mmHg).  . TUBAL LIGATION      There were no vitals filed for this visit.   Subjective Assessment - 04/07/20 1609    Subjective Pt. indicated no specific pain upon arrival today.  Pt. indicated MD was specific about trying to get leg straigther.    Patient is accompained by: Family member    Limitations Walking;Standing;House hold activities    Patient Stated Goals  Reduce pain.  Wants to walk for exercise, ride a bike.    Currently in Pain? Yes    Pain Score 4     Pain Location Knee    Pain Orientation Left    Pain Descriptors / Indicators Sore    Pain Type Surgical pain    Pain Onset 1 to 4 weeks ago    Pain Frequency Intermittent    Aggravating Factors  Fatigue c exercise    Pain Relieving Factors Icing, rest    Effect of Pain on Daily Activities Prolonged walking.                             [place] Adult PT Treatment/Exercise - 04/07/20 0001      Neuro Re-ed    Neuro Re-ed Details  BIG inspired fwd/rev stepping x 12 bilateral each way, tandem stance 90 seconds bilat      Knee/Hip Exercises: Stretches   Gastroc Stretch 3 reps;30 seconds      Knee/Hip Exercises: Aerobic   Nustep Lvl 5 12 mins      Knee/Hip Exercises: Seated   Other Seated Knee/Hip Exercises seated SLR 2 x 10, cues for heel prop and education on importance    Sit to Sand without UE support;Other (comment)   2x5 18 in chair                      PT Long Term Goals - 03/29/20 1606      PT LONG TERM GOAL #1   Title Patient will demonstrate/report pain at worst less than or equal to 2/10 to facilitate minimal limitation in daily activity secondary to pain symptoms.    Time 8    Period Weeks    Status On-going    Target Date 05/05/20      PT LONG TERM GOAL #2   Title Patient will demonstrate independent use of home exercise program to facilitate ability to maintain/progress functional gains from skilled physical therapy services.    Time 8    Period Weeks    Status On-going    Target Date 05/05/20      PT LONG TERM GOAL #3   Title Patient will demonstrate independent ambulation community distances > 300 ft to facilitate community integration at [facility].    Time 8    Period Weeks    Status On-going    Target Date 05/05/20      PT LONG TERM GOAL #4   Title Patient will demonstrate Lt knee AROM 0-110 degrees to facilitate ability to  perform transfers, sitting, ambulation, stair navigation s restriction due to mobility.    Time 8    Period Weeks    Status On-going    Target Date 05/05/20      PT  LONG TERM GOAL #5   Title Patient will demonstrate Bilateral LE MMT measured at 5/5 throughout to facilitate ability to perform usual standing, walking, stairs at PLOF s limitation due to symptoms.    Time 8    Period Weeks    Status On-going    Target Date 05/05/20                 Plan - 04/07/20 1611    Clinical Impression Statement Conitnued importance emphasized on extension based stretching at home.  Balance control lacking in dynamic stability as well as complaint surface.    Personal Factors and Comorbidities Comorbidity 3+    Comorbidities bilateral THA, most recent 08/2019.  Chronic renal disease, HTN, obesity    Examination-Activity Limitations Transfers;Locomotion Level;Squat;Stairs;Stand;Sit;Bed Mobility;Bend;Carry    Stability/Clinical Decision Making Evolving/Moderate complexity    Rehab Potential --   fair to good   PT Frequency 2x / week    PT Duration 8 weeks    PT Treatment/Interventions ADLs/Self Care Home Management;Electrical Stimulation;Iontophoresis 4mg /ml Dexamethasone;Moist Heat;Traction;Balance training;Therapeutic exercise;Therapeutic activities;Functional mobility training;Stair training;Gait training;Ultrasound;Neuromuscular re-education;Patient/family education;Wheelchair mobility training;Manual techniques;Manual lymph drainage;Vasopneumatic Device;Taping;Dry needling;Passive range of motion;Spinal Manipulations;Joint Manipulations    PT Next Visit Plan Extension based stretch for HEP emphasis, balance intervention.    PT Home Exercise Plan [email or web address]    Consulted and Agree with Plan of Care Patient           Patient will benefit from skilled therapeutic intervention in order to improve the following deficits and impairments:  Abnormal gait, Decreased endurance, Hypomobility, Increased  edema, Decreased activity tolerance, Decreased strength, Pain, Difficulty walking, Decreased mobility, Decreased balance, Decreased range of motion, Impaired perceived functional ability, Impaired flexibility, Decreased coordination  Visit Diagnosis: Chronic pain of left knee  Muscle weakness (generalized)  Difficulty in walking, not elsewhere classified  Localized edema  Stiffness of left knee, not elsewhere classified     Problem List Patient Active Problem List   Diagnosis Date Noted  . S/P total knee arthroplasty, left 03/09/2020  . History of total hip arthroplasty, right 08/21/2019  . Intermittent claudication ([place]) 07/08/2019  . Hypertensive nephropathy 12/15/2018  . Chronic renal disease, stage II 12/15/2018  . H/O total hip arthroplasty, left 11/07/2017  . Presence of left artificial hip joint 05/14/2017  . Preop cardiovascular exam 03/12/2017  . Cough   . Abdominal pain   . Symptomatic cholelithiasis 07/01/2016  . Nonspecific ST-T wave electrocardiographic changes   . Lower leg edema 02/16/2015  . Obesity (BMI 30.0-34.9) 02/16/2015  . Essential hypertension 01/11/2012  . Hyperthyroidism 01/11/2012  . Hypercholesteremia 01/11/2012  . H/O vaginal hysterectomy 1974 01/11/2012    Class: History of  . S/P removal of lung  partial R lobe  2010 01/11/2012  . History of hernia repair  R ing. 1975 01/11/2012  . BACK PAIN 11/12/2007  . DYSPNEA 11/11/2007    Scot Jun, PT, DPT, OCS, ATC 04/07/20  4:47 PM    [facility] [address] [place], [place], [address] Phone: [phone]   Fax:  [phone]  Name: [name] MRN: [id number] Date of Birth: 1933-05-14

## 2020-04-13 ENCOUNTER — Other Ambulatory Visit: Payer: Self-pay

## 2020-04-13 ENCOUNTER — Ambulatory Visit (INDEPENDENT_AMBULATORY_CARE_PROVIDER_SITE_OTHER): Payer: Medicare Other | Admitting: Rehabilitative and Restorative Service Providers"

## 2020-04-13 ENCOUNTER — Encounter: Payer: Self-pay | Admitting: Rehabilitative and Restorative Service Providers"

## 2020-04-13 DIAGNOSIS — R262 Difficulty in walking, not elsewhere classified: Secondary | ICD-10-CM

## 2020-04-13 DIAGNOSIS — G8929 Other chronic pain: Secondary | ICD-10-CM

## 2020-04-13 DIAGNOSIS — M6281 Muscle weakness (generalized): Secondary | ICD-10-CM

## 2020-04-13 DIAGNOSIS — R600 Localized edema: Secondary | ICD-10-CM

## 2020-04-13 DIAGNOSIS — M25562 Pain in left knee: Secondary | ICD-10-CM

## 2020-04-13 DIAGNOSIS — M25662 Stiffness of left knee, not elsewhere classified: Secondary | ICD-10-CM

## 2020-04-13 NOTE — Therapy (Signed)
[place], [place], [address] Phone: [phone]   Fax:  [phone]  Physical Therapy Treatment  Patient Details  Name: [name] MRN: [id number] Date of Birth: [birth year] Referring Provider (PT): Dr. Lorin Mercy   Encounter Date: 04/13/2020   PT End of Session - 04/13/20 1607    Visit Number 9    Number of Visits 16    Date for PT Re-Evaluation 05/05/20    Authorization Time Period POC 03/10/2020 - 05/05/2020    Authorization - Visit Number 9    Progress Note Due on Visit 10    PT Start Time 1555    PT Stop Time 1635    PT Time Calculation (min) 40 min    Activity Tolerance Patient tolerated treatment well    Behavior During Therapy [facility] for tasks assessed/performed           Past Medical History:  Diagnosis Date  . Aortic stenosis    Echo 07/05/16: Very mild AS, Mean gradient 10 mm Hg, Peak gradient (S) 19 mmHg   . Arthritis    left hip and back  . Bronchiectasis    isolated to RML; status post right middle lobe partial resection.  . Complication of anesthesia    hard to wake up  . Dyspnea   . Gall stones   . GERD (gastroesophageal reflux disease)    history   . H/O osteoporosis   . Hypertension   . Hyperthyroidism    endocrinologist - Dr. Chalmers Cater  . Pneumonia   . Yeast infection     Past Surgical History:  Procedure Laterality Date  . ABDOMINAL HYSTERECTOMY  1974  . CHOLECYSTECTOMY N/A 07/02/2016   Procedure: LAPAROSCOPIC CHOLECYSTECTOMY;  Surgeon: Stark Klein, MD;  Location: [place];  Service: General;  Laterality: N/A;  . COLONOSCOPY    . ENDOSCOPIC RETROGRADE CHOLANGIOPANCREATOGRAPHY (ERCP) WITH PROPOFOL N/A 08/07/2018   Procedure: ENDOSCOPIC RETROGRADE CHOLANGIOPANCREATOGRAPHY (ERCP) WITH PROPOFOL;  Surgeon: Carol Ada, MD;  Location[facility];  Service: Endoscopy;  Laterality: N/A;  . EYE SURGERY     stye removed  . HARDWARE REMOVAL Left 02/24/2020   Procedure: Hardware Removal, left knee;   Surgeon: [name], MD;  Location: [place];  Service: Orthopedics;  Laterality: Left;  . HERNIA REPAIR  1975  . KNEE SURGERY Left   . LUNG REMOVAL, PARTIAL  208   RML  . NM MYOVIEW LTD  06/2011   Normal EF. No ischemia or infarction.  [name]  08/07/2018   Procedure: [name];  Surgeon: Carol Ada, MD;  Location: [facility];  Service: Endoscopy;;  balloon sweep  . STERIOD INJECTION Right 02/24/2020   Procedure: RIGHT KNEE STEROID INTRA-ARTICULAR MARCAINE/DEPO-MEDROL INJECTION UNDER ANESTHESIA;  Surgeon: [name], MD;  Location: [place];  Service: Orthopedics;  Laterality: Right;  . TOTAL HIP ARTHROPLASTY Left 04/05/2017   Procedure: LEFT TOTAL HIP ARTHROPLASTY ANTERIOR APPROACH;  Surgeon: [name], MD;  Location: [place];  Service: Orthopedics;  Laterality: Left;  . TOTAL HIP ARTHROPLASTY Right 12/19/2018  . TOTAL HIP ARTHROPLASTY Right 12/19/2018   Procedure: RIGHT TOTAL HIP ARTHROPLASTY-DIRECT ANTERIOR APPROACH;  Surgeon: [name], MD;  Location: [place];  Service: Orthopedics;  Laterality: Right;  . TOTAL KNEE ARTHROPLASTY Left 02/24/2020   Procedure: LEFT TOTAL KNEE ARTHROPLASTY;  Surgeon: [name], MD;  Location: [place];  Service: Orthopedics;  Laterality: Left;  . TRANSTHORACIC ECHOCARDIOGRAM  06/2011   Mild concentric LVH. Normal EF with impaired  relaxation. Mildly elevated RV pressures of 30 and 40 mmHg.  Marland Kitchen TRANSTHORACIC ECHOCARDIOGRAM  06/2016   normal LV size and function. EF 60-65% with GRD 2 DD. Mild aortic stenosis. Mild LA dilation. Mild to moderately increased PA pressures (46 mmHg).  . TUBAL LIGATION      There were no vitals filed for this visit.   Subjective Assessment - 04/13/20 1559    Subjective Pt. stated very mild discomfort at times today.  Pt. indicated she felt her knee give way sometimes.    Patient is accompained by: Family member    Limitations Walking;Standing;House hold activities    Patient Stated Goals Reduce pain.  Wants  to walk for exercise, ride a bike.    Currently in Pain? Yes    Pain Score 1     Pain Location Knee    Pain Orientation Left    Pain Descriptors / Indicators Sore    Pain Type Surgical pain    Pain Onset More than a month ago    Pain Frequency Intermittent    Aggravating Factors  fatigue after activity, leg straigthening discomfort.    Pain Relieving Factors ice and rest    Effect of Pain on Daily Activities Prolonged standing/walking, squat/transfers                             [facility] Adult PT Treatment/Exercise - 04/13/20 0001      Neuro Re-ed    Neuro Re-ed Details  BIG inspired fwd stepping x 15 bilateral, retro x 15 bilateral      Knee/Hip Exercises: Aerobic   Nustep Lvl 6 10 mins      Knee/Hip Exercises: Machines for Strengthening   Total Gym Leg Press shuttle SL 3 x 10 25 lbs   , performed bilateral      Knee/Hip Exercises: Standing   Lateral Step Up 20 reps;Left;Step Height: 4"      Knee/Hip Exercises: Seated   Other Seated Knee/Hip Exercises seated SLR 2 x 12 Lt LE    Sit to Sand without UE support;Other (comment)   x 10                      PT Long Term Goals - 03/29/20 1606      PT LONG TERM GOAL #1   Title Patient will demonstrate/report pain at worst less than or equal to 2/10 to facilitate minimal limitation in daily activity secondary to pain symptoms.    Time 8    Period Weeks    Status On-going    Target Date 05/05/20      PT LONG TERM GOAL #2   Title Patient will demonstrate independent use of home exercise program to facilitate ability to maintain/progress functional gains from skilled physical therapy services.    Time 8    Period Weeks    Status On-going    Target Date 05/05/20      PT LONG TERM GOAL #3   Title Patient will demonstrate independent ambulation community distances > 300 ft to facilitate community integration at [facility].    Time 8    Period Weeks    Status On-going    Target Date 05/05/20      PT  LONG TERM GOAL #4   Title Patient will demonstrate Lt knee AROM 0-110 degrees to facilitate ability to perform transfers, sitting, ambulation, stair navigation s restriction due to mobility.    Time 8  Period Weeks    Status On-going    Target Date 05/05/20      PT LONG TERM GOAL #5   Title Patient will demonstrate Bilateral LE MMT measured at 5/5 throughout to facilitate ability to perform usual standing, walking, stairs at PLOF s limitation due to symptoms.    Time 8    Period Weeks    Status On-going    Target Date 05/05/20                 Plan - 04/13/20 1629    Clinical Impression Statement Pt. continued to require cues for HEP review and encouragement for progression of balance activity difficulty.  Currently walking with reduced gait speed compared to community ambulator as well as with shortened stride length. Continued strengthening and balance indicated.    Personal Factors and Comorbidities Comorbidity 3+    Comorbidities bilateral THA, most recent 08/2019.  Chronic renal disease, HTN, obesity    Examination-Activity Limitations Transfers;Locomotion Level;Squat;Stairs;Stand;Sit;Bed Mobility;Bend;Carry    Stability/Clinical Decision Making Evolving/Moderate complexity    Rehab Potential --   fair to good   PT Frequency 2x / week    PT Duration 8 weeks    PT Treatment/Interventions ADLs/Self Care Home Management;Electrical Stimulation;Iontophoresis 4mg /ml Dexamethasone;Moist Heat;Traction;Balance training;Therapeutic exercise;Therapeutic activities;Functional mobility training;Stair training;Gait training;Ultrasound;Neuromuscular re-education;Patient/family education;Wheelchair mobility training;Manual techniques;Manual lymph drainage;Vasopneumatic Device;Taping;Dry needling;Passive range of motion;Spinal Manipulations;Joint Manipulations    PT Next Visit Plan Continued strengthening, balance, extension stretching.    PT Home Exercise Plan [id number]    Consulted and Agree  with Plan of Care Patient           Patient will benefit from skilled therapeutic intervention in order to improve the following deficits and impairments:  Abnormal gait, Decreased endurance, Hypomobility, Increased edema, Decreased activity tolerance, Decreased strength, Pain, Difficulty walking, Decreased mobility, Decreased balance, Decreased range of motion, Impaired perceived functional ability, Impaired flexibility, Decreased coordination  Visit Diagnosis: Chronic pain of left knee  Muscle weakness (generalized)  Difficulty in walking, not elsewhere classified  Localized edema  Stiffness of left knee, not elsewhere classified     Problem List Patient Active Problem List   Diagnosis Date Noted  . S/P total knee arthroplasty, left 03/09/2020  . History of total hip arthroplasty, right 08/21/2019  . Intermittent claudication ([place]) 07/08/2019  . Hypertensive nephropathy 12/15/2018  . Chronic renal disease, stage II 12/15/2018  . H/O total hip arthroplasty, left 11/07/2017  . Presence of left artificial hip joint 05/14/2017  . Preop cardiovascular exam 03/12/2017  . Cough   . Abdominal pain   . Symptomatic cholelithiasis 07/01/2016  . Nonspecific ST-T wave electrocardiographic changes   . Lower leg edema 02/16/2015  . Obesity (BMI 30.0-34.9) 02/16/2015  . Essential hypertension 01/11/2012  . Hyperthyroidism 01/11/2012  . Hypercholesteremia 01/11/2012  . H/O vaginal hysterectomy 1974 01/11/2012    Class: History of  . S/P removal of lung  partial R lobe  2010 01/11/2012  . History of hernia repair  R ing. 1975 01/11/2012  . BACK PAIN 11/12/2007  . DYSPNEA 11/11/2007    Scot Jun, PT, DPT, OCS, ATC 04/13/20  4:34 PM    [facility] [address] [place], [place], [address] Phone: [phone]   Fax:  [phone]  Name: TOLEEN LACHAPELLE MRN: [id number] Date of Birth: [birth year]

## 2020-04-15 ENCOUNTER — Encounter: Payer: Self-pay | Admitting: Rehabilitative and Restorative Service Providers"

## 2020-04-15 ENCOUNTER — Ambulatory Visit (INDEPENDENT_AMBULATORY_CARE_PROVIDER_SITE_OTHER): Payer: Medicare Other | Admitting: Rehabilitative and Restorative Service Providers"

## 2020-04-15 ENCOUNTER — Other Ambulatory Visit: Payer: Self-pay

## 2020-04-15 DIAGNOSIS — M6281 Muscle weakness (generalized): Secondary | ICD-10-CM

## 2020-04-15 DIAGNOSIS — M25662 Stiffness of left knee, not elsewhere classified: Secondary | ICD-10-CM

## 2020-04-15 DIAGNOSIS — G8929 Other chronic pain: Secondary | ICD-10-CM

## 2020-04-15 DIAGNOSIS — M25562 Pain in left knee: Secondary | ICD-10-CM

## 2020-04-15 DIAGNOSIS — R600 Localized edema: Secondary | ICD-10-CM | POA: Diagnosis not present

## 2020-04-15 DIAGNOSIS — R262 Difficulty in walking, not elsewhere classified: Secondary | ICD-10-CM | POA: Diagnosis not present

## 2020-04-15 NOTE — Therapy (Signed)
[place], [place], [address] Phone: [phone]   Fax:  [phone]  Physical Therapy Treatment/Progress Note  Patient Details  Name: [name] MRN: [id number] Date of Birth: Feb 19, 1933 Referring Provider (PT): Dr. Lorin Mercy   Encounter Date: 04/15/2020   Progress Note Reporting Period 03/10/2020 to 04/15/2020  See note below for Objective Data and Assessment of Progress/Goals.        PT End of Session - 04/15/20 1601    Visit Number 10    Number of Visits 16    Date for PT Re-Evaluation 05/05/20    Authorization Time Period POC 03/10/2020 - 05/05/2020    Authorization - Visit Number 10    Progress Note Due on Visit 20    PT Start Time 1559    PT Stop Time 1638    PT Time Calculation (min) 39 min    Activity Tolerance Patient tolerated treatment well    Behavior During Therapy WFL for tasks assessed/performed           Past Medical History:  Diagnosis Date  . Aortic stenosis    Echo 07/05/16: Very mild AS, Mean gradient 10 mm Hg, Peak gradient (S) 19 mmHg   . Arthritis    left hip and back  . Bronchiectasis    isolated to RML; status post right middle lobe partial resection.  . Complication of anesthesia    hard to wake up  . Dyspnea   . Gall stones   . GERD (gastroesophageal reflux disease)    history   . H/O osteoporosis   . Hypertension   . Hyperthyroidism    endocrinologist - Dr. Chalmers Cater  . Pneumonia   . Yeast infection     Past Surgical History:  Procedure Laterality Date  . ABDOMINAL HYSTERECTOMY  1974  . CHOLECYSTECTOMY N/A 07/02/2016   Procedure: LAPAROSCOPIC CHOLECYSTECTOMY;  Surgeon: Stark Klein, MD;  Location: [place];  Service: General;  Laterality: N/A;  . COLONOSCOPY    . ENDOSCOPIC RETROGRADE CHOLANGIOPANCREATOGRAPHY (ERCP) WITH PROPOFOL N/A 08/07/2018   Procedure: ENDOSCOPIC RETROGRADE CHOLANGIOPANCREATOGRAPHY (ERCP) WITH PROPOFOL;  Surgeon: Carol Ada, MD;  [facility];   Service: Endoscopy;  Laterality: N/A;  . EYE SURGERY     stye removed  . HARDWARE REMOVAL Left 02/24/2020   Procedure: Hardware Removal, left knee;  Surgeon: [name], MD;  Location: [place];  Service: Orthopedics;  Laterality: Left;  . HERNIA REPAIR  1975  . KNEE SURGERY Left   . LUNG REMOVAL, PARTIAL  208   RML  . NM MYOVIEW LTD  06/2011   Normal EF. No ischemia or infarction.  [name]  08/07/2018   Procedure: [name];  Surgeon: Carol Ada, MD;  [facility]: [facility];  Service: Endoscopy;;  balloon sweep  . STERIOD INJECTION Right 02/24/2020   Procedure: RIGHT KNEE STEROID INTRA-ARTICULAR MARCAINE/DEPO-MEDROL INJECTION UNDER ANESTHESIA;  Surgeon: [name], MD;  Location: [place];  Service: Orthopedics;  Laterality: Right;  . TOTAL HIP ARTHROPLASTY Left 04/05/2017   Procedure: LEFT TOTAL HIP ARTHROPLASTY ANTERIOR APPROACH;  Surgeon: [name], MD;  Location: [place];  Service: Orthopedics;  Laterality: Left;  . TOTAL HIP ARTHROPLASTY Right 12/19/2018  . TOTAL HIP ARTHROPLASTY Right 12/19/2018   Procedure: RIGHT TOTAL HIP ARTHROPLASTY-DIRECT ANTERIOR APPROACH;  Surgeon: [name], MD;  Location: [place];  Service: Orthopedics;  Laterality: Right;  . TOTAL KNEE ARTHROPLASTY Left 02/24/2020   Procedure: LEFT TOTAL KNEE ARTHROPLASTY;  Surgeon: [name],  MD;  Location: [place];  Service: Orthopedics;  Laterality: Left;  . TRANSTHORACIC ECHOCARDIOGRAM  06/2011   Mild concentric LVH. Normal EF with impaired relaxation. Mildly elevated RV pressures of 30 and 40 mmHg.  Marland Kitchen TRANSTHORACIC ECHOCARDIOGRAM  06/2016   normal LV size and function. EF 60-65% with GRD 2 DD. Mild aortic stenosis. Mild LA dilation. Mild to moderately increased PA pressures (46 mmHg).  . TUBAL LIGATION      There were no vitals filed for this visit.   Subjective Assessment - 04/15/20 1606    Subjective Pt. indicated mild to moderate pain at worst in knee . Pt. indicated complaints of tenderness  and difficulty lying on Lt side due to hip pain.    Patient is accompained by: Family member    Limitations Walking;Standing;House hold activities    Patient Stated Goals Reduce pain.  Wants to walk for exercise, ride a bike.    Currently in Pain? Yes    Pain Score 0-No pain   pain at worst in Lt knee in last week 4/10   Pain Location Knee    Pain Orientation Left    Pain Descriptors / Indicators Sore    Pain Type Surgical pain    Pain Onset More than a month ago    Pain Frequency Intermittent    Aggravating Factors  insidious onset per Pt. report    Pain Relieving Factors rub with cream, ice bag, sometimes HEP.    Effect of Pain on Daily Activities Prolonged walking/standing independent.              [facility] PT Assessment - 04/15/20 0001      Assessment   Medical Diagnosis S/P L TKA    Referring Provider (PT) Dr. Lorin Mercy    Onset Date/Surgical Date 02/24/20      Functional Tests   Functional tests Single leg stance      Single Leg Stance   Comments Rt SLS 20 seconds, Lt < 5 seconds      AROM   Left Knee Extension -5    Left Knee Flexion 101      Strength   Left Knee Flexion 5/5    Left Knee Extension 4+/5                         [facility] Adult PT Treatment/Exercise - 04/15/20 0001      Neuro Re-ed    Neuro Re-ed Details  Rt SLS 30 sec x 2, Lt SLS 30 sec x 3 (finger tip assist), retro ambulation 15 ft x 5      Knee/Hip Exercises: Aerobic   Nustep Lvl 5 12 mins      Knee/Hip Exercises: Machines for Strengthening   Total Gym Leg Press shuttle SL leg press 25 lbs       Knee/Hip Exercises: Standing   Forward Step Up Left;2 sets;10 reps;Step Height: 6"      Knee/Hip Exercises: Seated   Other Seated Knee/Hip Exercises seated SLR 2 x 10 Lt LE                       PT Long Term Goals - 04/15/20 1609      PT LONG TERM GOAL #1   Title Patient will demonstrate/report pain at worst less than or equal to 2/10 to facilitate minimal limitation in  daily activity secondary to pain symptoms.    Time 8    Period Weeks  Status On-going    Target Date 05/05/20      PT LONG TERM GOAL #2   Title Patient will demonstrate independent use of home exercise program to facilitate ability to maintain/progress functional gains from skilled physical therapy services.    Time 8    Period Weeks    Status Achieved    Target Date 05/05/20      PT LONG TERM GOAL #3   Title Patient will demonstrate independent ambulation community distances > 300 ft to facilitate community integration at [facility].    Time 8    Period Weeks    Status On-going    Target Date 05/05/20      PT LONG TERM GOAL #4   Title Patient will demonstrate Lt knee AROM 0-110 degrees to facilitate ability to perform transfers, sitting, ambulation, stair navigation s restriction due to mobility.    Time 8    Period Weeks    Status On-going    Target Date 05/05/20      PT LONG TERM GOAL #5   Title Patient will demonstrate Bilateral LE MMT measured at 5/5 throughout to facilitate ability to perform usual standing, walking, stairs at PLOF s limitation due to symptoms.    Time 8    Period Weeks    Status On-going    Target Date 05/05/20                 Plan - 04/15/20 1602    Clinical Impression Statement Pt. has attended 10 visits overall during course of treatment.  See objective data for updated information.  GROC +7 at this time. Pt. currently demonstrated ambulation c SPC c reduced gait speed below community speed.  Pt. may continue to benefit from skilled PT services to improve stability, strength and progress mobility independence.    Personal Factors and Comorbidities Comorbidity 3+    Comorbidities bilateral THA, most recent 08/2019.  Chronic renal disease, HTN, obesity    Examination-Activity Limitations Transfers;Locomotion Level;Squat;Stairs;Stand;Sit;Bed Mobility;Bend;Carry    Stability/Clinical Decision Making Evolving/Moderate complexity    Rehab Potential --    fair to good   PT Frequency 2x / week   1-2x/week   PT Duration 8 weeks    PT Treatment/Interventions ADLs/Self Care Home Management;Electrical Stimulation;Iontophoresis 4mg /ml Dexamethasone;Moist Heat;Traction;Balance training;Therapeutic exercise;Therapeutic activities;Functional mobility training;Stair training;Gait training;Ultrasound;Neuromuscular re-education;Patient/family education;Wheelchair mobility training;Manual techniques;Manual lymph drainage;Vasopneumatic Device;Taping;Dry needling;Passive range of motion;Spinal Manipulations;Joint Manipulations    PT Next Visit Plan Recommend continued skilled PT.  Working to transition to 1x/week as appropriate closer to d/c timing.    PT Home Exercise Plan [id number]    Consulted and Agree with Plan of Care Patient           Patient will benefit from skilled therapeutic intervention in order to improve the following deficits and impairments:  Abnormal gait, Decreased endurance, Hypomobility, Increased edema, Decreased activity tolerance, Decreased strength, Pain, Difficulty walking, Decreased mobility, Decreased balance, Decreased range of motion, Impaired perceived functional ability, Impaired flexibility, Decreased coordination  Visit Diagnosis: Chronic pain of left knee  Muscle weakness (generalized)  Difficulty in walking, not elsewhere classified  Localized edema  Stiffness of left knee, not elsewhere classified     Problem List Patient Active Problem List   Diagnosis Date Noted  . S/P total knee arthroplasty, left 03/09/2020  . History of total hip arthroplasty, right 08/21/2019  . Intermittent claudication ([place]) 07/08/2019  . Hypertensive nephropathy 12/15/2018  . Chronic renal disease, stage II 12/15/2018  . H/O total hip arthroplasty, left  11/07/2017  . Presence of left artificial hip joint 05/14/2017  . Preop cardiovascular exam 03/12/2017  . Cough   . Abdominal pain   . Symptomatic cholelithiasis 07/01/2016  .  Nonspecific ST-T wave electrocardiographic changes   . Lower leg edema 02/16/2015  . Obesity (BMI 30.0-34.9) 02/16/2015  . Essential hypertension 01/11/2012  . Hyperthyroidism 01/11/2012  . Hypercholesteremia 01/11/2012  . H/O vaginal hysterectomy 1974 01/11/2012    Class: History of  . S/P removal of lung  partial R lobe  2010 01/11/2012  . History of hernia repair  R ing. 1975 01/11/2012  . BACK PAIN 11/12/2007  . DYSPNEA 11/11/2007    Scot Jun, PT, DPT, OCS, ATC 04/15/20  4:39 PM     [facility] [address] [place], [place], [address] Phone: [phone]   Fax:  [phone]  Name: ELISEA KHADER MRN: [id number] Date of Birth: 1933-04-10

## 2020-04-21 ENCOUNTER — Telehealth: Payer: Self-pay | Admitting: *Deleted

## 2020-04-21 NOTE — Telephone Encounter (Signed)
Patient called and requested refill of her pain medication please. Thank you!

## 2020-04-22 ENCOUNTER — Other Ambulatory Visit: Payer: Self-pay

## 2020-04-22 ENCOUNTER — Encounter: Payer: Self-pay | Admitting: Physical Therapy

## 2020-04-22 ENCOUNTER — Ambulatory Visit (INDEPENDENT_AMBULATORY_CARE_PROVIDER_SITE_OTHER): Payer: Medicare Other | Admitting: Physical Therapy

## 2020-04-22 DIAGNOSIS — G8929 Other chronic pain: Secondary | ICD-10-CM

## 2020-04-22 DIAGNOSIS — M6281 Muscle weakness (generalized): Secondary | ICD-10-CM | POA: Diagnosis not present

## 2020-04-22 DIAGNOSIS — R600 Localized edema: Secondary | ICD-10-CM | POA: Diagnosis not present

## 2020-04-22 DIAGNOSIS — R262 Difficulty in walking, not elsewhere classified: Secondary | ICD-10-CM

## 2020-04-22 DIAGNOSIS — M25562 Pain in left knee: Secondary | ICD-10-CM

## 2020-04-22 DIAGNOSIS — M25662 Stiffness of left knee, not elsewhere classified: Secondary | ICD-10-CM | POA: Diagnosis not present

## 2020-04-22 MED ORDER — TRAMADOL HCL 50 MG PO TABS: 50.0000 mg | ORAL_TABLET | Freq: Three times a day (TID) | ORAL | 0 refills | Status: DC | PRN

## 2020-04-22 NOTE — Telephone Encounter (Signed)
OK call in tramadol refill thanks . Same as last sig. thanks

## 2020-04-22 NOTE — Therapy (Signed)
[place], [place], [address] Phone: [phone]   Fax:  [phone]  Physical Therapy Treatment  Patient Details  Name: [name] MRN: [id number] Date of Birth: 1933-10-04 Referring Provider (PT): Dr. Lorin Smith   Encounter Date: 04/22/2020   PT End of Session - 04/22/20 1308    Visit Number 11    Number of Visits 16    Date for PT Re-Evaluation 05/05/20    Authorization Time Period POC 03/10/2020 - 05/05/2020    Authorization - Visit Number 10    Progress Note Due on Visit 20    PT Start Time 1304    PT Stop Time 1345    PT Time Calculation (min) 41 min    Activity Tolerance Patient tolerated treatment well    Behavior During Therapy [facility] for tasks assessed/performed           Past Medical History:  Diagnosis Date  . Aortic stenosis    Echo 07/05/16: Very mild AS, Mean gradient 10 mm Hg, Peak gradient (S) 19 mmHg   . Arthritis    left hip and back  . Bronchiectasis    isolated to RML; status post right middle lobe partial resection.  . Complication of anesthesia    hard to wake up  . Dyspnea   . Gall stones   . GERD (gastroesophageal reflux disease)    history   . H/O osteoporosis   . Hypertension   . Hyperthyroidism    endocrinologist - Dr. Chalmers Smith  . Pneumonia   . Yeast infection     Past Surgical History:  Procedure Laterality Date  . ABDOMINAL HYSTERECTOMY  1974  . CHOLECYSTECTOMY N/A 07/02/2016   Procedure: LAPAROSCOPIC CHOLECYSTECTOMY;  Surgeon: Savannah Klein, MD;  Location: [facility];  Service: General;  Laterality: N/A;  . COLONOSCOPY    . ENDOSCOPIC RETROGRADE CHOLANGIOPANCREATOGRAPHY (ERCP) WITH PROPOFOL N/A 08/07/2018   Procedure: ENDOSCOPIC RETROGRADE CHOLANGIOPANCREATOGRAPHY (ERCP) WITH PROPOFOL;  Surgeon: Savannah Ada, MD;  [facility];  Service: Endoscopy;  Laterality: N/A;  . EYE SURGERY     stye removed  . HARDWARE REMOVAL Left 02/24/2020   Procedure: Hardware Removal, left  knee;  Surgeon: Savannah Killings, MD;  Location: [facility];  Service: Orthopedics;  Laterality: Left;  . HERNIA REPAIR  1975  . KNEE SURGERY Left   . LUNG REMOVAL, PARTIAL  208   RML  . NM MYOVIEW LTD  06/2011   Normal EF. No ischemia or infarction.  [name]  08/07/2018   Procedure: [name];  Surgeon: Savannah Ada, MD;  [facility];  Service: Endoscopy;;  balloon sweep  . STERIOD INJECTION Right 02/24/2020   Procedure: RIGHT KNEE STEROID INTRA-ARTICULAR MARCAINE/DEPO-MEDROL INJECTION UNDER ANESTHESIA;  Surgeon: Savannah Killings, MD;  Location: [facility];  Service: Orthopedics;  Laterality: Right;  . TOTAL HIP ARTHROPLASTY Left 04/05/2017   Procedure: LEFT TOTAL HIP ARTHROPLASTY ANTERIOR APPROACH;  Surgeon: Savannah Killings, MD;  Location: [facility];  Service: Orthopedics;  Laterality: Left;  . TOTAL HIP ARTHROPLASTY Right 12/19/2018  . TOTAL HIP ARTHROPLASTY Right 12/19/2018   Procedure: RIGHT TOTAL HIP ARTHROPLASTY-DIRECT ANTERIOR APPROACH;  Surgeon: Savannah Killings, MD;  Location: [facility];  Service: Orthopedics;  Laterality: Right;  . TOTAL KNEE ARTHROPLASTY Left 02/24/2020   Procedure: LEFT TOTAL KNEE ARTHROPLASTY;  Surgeon: Savannah Killings, MD;  Location: [facility];  Service: Orthopedics;  Laterality: Left;  . TRANSTHORACIC ECHOCARDIOGRAM  06/2011   Mild concentric LVH. Normal EF with impaired  relaxation. Mildly elevated RV pressures of 30 and 40 mmHg.  Marland Kitchen TRANSTHORACIC ECHOCARDIOGRAM  06/2016   normal LV size and function. EF 60-65% with GRD 2 DD. Mild aortic stenosis. Mild LA dilation. Mild to moderately increased PA pressures (46 mmHg).  . TUBAL LIGATION      There were no vitals filed for this visit.   Subjective Assessment - 04/22/20 1309    Subjective " I feel like I am getting my knee straight, and pain is only 3/10 today. [name] been doing my exercises today."    Currently in Pain? Yes    Pain Score 3     Pain Orientation Left    Pain Descriptors / Indicators Aching               [facility] PT Assessment - 04/22/20 0001      Assessment   Medical Diagnosis S/P L TKA    Referring Provider (PT) Dr. Lorin Smith      AROM   Left Knee Extension 11    Left Knee Flexion 101                         [facility] Adult PT Treatment/Exercise - 04/22/20 0001      Knee/Hip Exercises: Stretches   Active Hamstring Stretch Left;2 reps;30 seconds      Knee/Hip Exercises: Aerobic   Recumbent Bike L1 x 6 min full revoluations going backward x 3 min and foward x 3 min    slow controlled motion     Knee/Hip Exercises: Machines for Strengthening   Total Gym Leg Press shuttle SL press 1 x 12 25#, 1 x 12 37#      Knee/Hip Exercises: Seated   Long Arc Quad Strengthening;Left    Long Arc Quad Weight 5 lbs.    Sit to [facility] 2 sets;15 reps      Manual Therapy   Manual Therapy Joint mobilization;Other (comment)    Joint Mobilization tibiofemoral mobs grade III AP, and tibial ER combined with quad set for screw home mechanism    Other Manual Therapy hamstring tack and stretch 2 x 15 using tennis ball and LAQ               Balance Exercises - 04/22/20 0001      Balance Exercises: Standing   Rockerboard Anterior/posterior;10 reps   x 2 set with 1 HHA   Rockerboard Limitations maintaining standing balance 4 x max holding balance max of 8 seconds                  PT Long Term Goals - 04/15/20 1609      PT LONG TERM GOAL #1   Title Patient will demonstrate/report pain at worst less than or equal to 2/10 to facilitate minimal limitation in daily activity secondary to pain symptoms.    Time 8    Period Weeks    Status On-going    Target Date 05/05/20      PT LONG TERM GOAL #2   Title Patient will demonstrate independent use of home exercise program to facilitate ability to maintain/progress functional gains from skilled physical therapy services.    Time 8    Period Weeks    Status Achieved    Target Date 05/05/20      PT LONG TERM GOAL #3   Title  Patient will demonstrate independent ambulation community distances > 300 ft to facilitate community integration at [facility]    Time 8  Period Weeks    Status On-going    Target Date 05/05/20      PT LONG TERM GOAL #4   Title Patient will demonstrate Lt knee AROM 0-110 degrees to facilitate ability to perform transfers, sitting, ambulation, stair navigation s restriction due to mobility.    Time 8    Period Weeks    Status On-going    Target Date 05/05/20      PT LONG TERM GOAL #5   Title Patient will demonstrate Bilateral LE MMT measured at 5/5 throughout to facilitate ability to perform usual standing, walking, stairs at PLOF s limitation due to symptoms.    Time 8    Period Weeks    Status On-going    Target Date 05/05/20                 Plan - 04/22/20 1356    Clinical Impression Statement pt demonstrates knee ROM at 11 - 101 today. Continued working on knee ROM and which she demonstrate increased apprehension requiring intermittent cues that pain isn't damage. She was able achieve full revoluations on the bike, and was able to do all exercise reporting no increased pain or issues. end of session she noted decreased stiffness and declined modalities end of session.    PT Treatment/Interventions ADLs/Self Care Home Management;Electrical Stimulation;Iontophoresis 4mg /ml Dexamethasone;Moist Heat;Traction;Balance training;Therapeutic exercise;Therapeutic activities;Functional mobility training;Stair training;Gait training;Ultrasound;Neuromuscular re-education;Patient/family education;Wheelchair mobility training;Manual techniques;Manual lymph drainage;Vasopneumatic Device;Taping;Dry needling;Passive range of motion;Spinal Manipulations;Joint Manipulations    PT Next Visit Plan Recommend continued skilled PT.  Working to transition to 1x/week as appropriate closer to d/c timing.    PT Home Exercise Plan [id number]    Consulted and Agree with Plan of Care Patient           Patient  will benefit from skilled therapeutic intervention in order to improve the following deficits and impairments:  Abnormal gait, Decreased endurance, Hypomobility, Increased edema, Decreased activity tolerance, Decreased strength, Pain, Difficulty walking, Decreased mobility, Decreased balance, Decreased range of motion, Impaired perceived functional ability, Impaired flexibility, Decreased coordination  Visit Diagnosis: Chronic pain of left knee  Muscle weakness (generalized)  Difficulty in walking, not elsewhere classified  Localized edema  Stiffness of left knee, not elsewhere classified     Problem List Patient Active Problem List   Diagnosis Date Noted  . S/P total knee arthroplasty, left 03/09/2020  . History of total hip arthroplasty, right 08/21/2019  . Intermittent claudication ([place]) 07/08/2019  . Hypertensive nephropathy 12/15/2018  . Chronic renal disease, stage II 12/15/2018  . H/O total hip arthroplasty, left 11/07/2017  . Presence of left artificial hip joint 05/14/2017  . Preop cardiovascular exam 03/12/2017  . Cough   . Abdominal pain   . Symptomatic cholelithiasis 07/01/2016  . Nonspecific ST-T wave electrocardiographic changes   . Lower leg edema 02/16/2015  . Obesity (BMI 30.0-34.9) 02/16/2015  . Essential hypertension 01/11/2012  . Hyperthyroidism 01/11/2012  . Hypercholesteremia 01/11/2012  . H/O vaginal hysterectomy 1974 01/11/2012    Class: History of  . S/P removal of lung  partial R lobe  2010 01/11/2012  . History of hernia repair  R ing. 1975 01/11/2012  . BACK PAIN 11/12/2007  . DYSPNEA 11/11/2007   Starr Lake PT, DPT, LAT, ATC  04/22/20  2:05 PM      [facility] [address] [place], [place], [address] Phone: [phone]   Fax:  [phone]  Name: [name] MRN: [id number] Date of Birth: 03/08/1933

## 2020-04-22 NOTE — Telephone Encounter (Signed)
Called to pharmacy. Advised pt done.

## 2020-04-25 ENCOUNTER — Encounter: Payer: Self-pay | Admitting: Physical Therapy

## 2020-04-25 ENCOUNTER — Other Ambulatory Visit: Payer: Self-pay

## 2020-04-25 ENCOUNTER — Ambulatory Visit (INDEPENDENT_AMBULATORY_CARE_PROVIDER_SITE_OTHER): Payer: Medicare Other | Admitting: Physical Therapy

## 2020-04-25 DIAGNOSIS — M25562 Pain in left knee: Secondary | ICD-10-CM | POA: Diagnosis not present

## 2020-04-25 DIAGNOSIS — R600 Localized edema: Secondary | ICD-10-CM

## 2020-04-25 DIAGNOSIS — M25662 Stiffness of left knee, not elsewhere classified: Secondary | ICD-10-CM | POA: Diagnosis not present

## 2020-04-25 DIAGNOSIS — M6281 Muscle weakness (generalized): Secondary | ICD-10-CM | POA: Diagnosis not present

## 2020-04-25 DIAGNOSIS — G8929 Other chronic pain: Secondary | ICD-10-CM | POA: Diagnosis not present

## 2020-04-25 DIAGNOSIS — R262 Difficulty in walking, not elsewhere classified: Secondary | ICD-10-CM

## 2020-04-25 NOTE — Therapy (Signed)
[place], [place], [address] Phone: [phone]   Fax:  [phone]  Physical Therapy Treatment  Patient Details  Name: [name] MRN: [id number] Date of Birth: 1933/09/23 Referring Provider (PT): Dr. Lorin Mercy   Encounter Date: 04/25/2020   PT End of Session - 04/25/20 1527    Visit Number 12    Number of Visits 16    Date for PT Re-Evaluation 05/05/20    Authorization Time Period POC 03/10/2020 - 05/05/2020    Progress Note Due on Visit 20    PT Start Time 1430    PT Stop Time 1511    PT Time Calculation (min) 41 min    Activity Tolerance Patient tolerated treatment well    Behavior During Therapy [facility] for tasks assessed/performed           Past Medical History:  Diagnosis Date  . Aortic stenosis    Echo 07/05/16: Very mild AS, Mean gradient 10 mm Hg, Peak gradient (S) 19 mmHg   . Arthritis    left hip and back  . Bronchiectasis    isolated to RML; status post right middle lobe partial resection.  . Complication of anesthesia    hard to wake up  . Dyspnea   . Gall stones   . GERD (gastroesophageal reflux disease)    history   . H/O osteoporosis   . Hypertension   . Hyperthyroidism    endocrinologist - Dr. Chalmers Cater  . Pneumonia   . Yeast infection     Past Surgical History:  Procedure Laterality Date  . ABDOMINAL HYSTERECTOMY  1974  . CHOLECYSTECTOMY N/A 07/02/2016   Procedure: LAPAROSCOPIC CHOLECYSTECTOMY;  Surgeon: Stark Klein, MD;  Location: [facility];  Service: General;  Laterality: N/A;  . COLONOSCOPY    . ENDOSCOPIC RETROGRADE CHOLANGIOPANCREATOGRAPHY (ERCP) WITH PROPOFOL N/A 08/07/2018   Procedure: ENDOSCOPIC RETROGRADE CHOLANGIOPANCREATOGRAPHY (ERCP) WITH PROPOFOL;  Surgeon: Carol Ada, MD;  [facility];  Service: Endoscopy;  Laterality: N/A;  . EYE SURGERY     stye removed  . HARDWARE REMOVAL Left 02/24/2020   Procedure: Hardware Removal, left knee;  Surgeon: [name], MD;   Location: [facility];  Service: Orthopedics;  Laterality: Left;  . HERNIA REPAIR  1975  . KNEE SURGERY Left   . LUNG REMOVAL, PARTIAL  208   RML  . NM MYOVIEW LTD  06/2011   Normal EF. No ischemia or infarction.  [name]  08/07/2018   Procedure: [name];  Surgeon: Carol Ada, MD;  [facility];  Service: Endoscopy;;  balloon sweep  . STERIOD INJECTION Right 02/24/2020   Procedure: RIGHT KNEE STEROID INTRA-ARTICULAR MARCAINE/DEPO-MEDROL INJECTION UNDER ANESTHESIA;  Surgeon: [name], MD;  Location: [facility];  Service: Orthopedics;  Laterality: Right;  . TOTAL HIP ARTHROPLASTY Left 04/05/2017   Procedure: LEFT TOTAL HIP ARTHROPLASTY ANTERIOR APPROACH;  Surgeon: [name], MD;  Location: [facility];  Service: Orthopedics;  Laterality: Left;  . TOTAL HIP ARTHROPLASTY Right 12/19/2018  . TOTAL HIP ARTHROPLASTY Right 12/19/2018   Procedure: RIGHT TOTAL HIP ARTHROPLASTY-DIRECT ANTERIOR APPROACH;  Surgeon: [name], MD;  Location: [facility];  Service: Orthopedics;  Laterality: Right;  . TOTAL KNEE ARTHROPLASTY Left 02/24/2020   Procedure: LEFT TOTAL KNEE ARTHROPLASTY;  Surgeon: [name], MD;  Location: [facility];  Service: Orthopedics;  Laterality: Left;  . TRANSTHORACIC ECHOCARDIOGRAM  06/2011   Mild concentric LVH. Normal EF with impaired relaxation. Mildly elevated RV pressures of 30 and  40 mmHg.  Marland Kitchen TRANSTHORACIC ECHOCARDIOGRAM  06/2016   normal LV size and function. EF 60-65% with GRD 2 DD. Mild aortic stenosis. Mild LA dilation. Mild to moderately increased PA pressures (46 mmHg).  . TUBAL LIGATION      There were no vitals filed for this visit.   Subjective Assessment - 04/25/20 1513    Subjective Pt arriving to therapy reproting feeling pretty good only stiffness in her knee.    Limitations Walking;Standing;House hold activities    Patient Stated Goals Reduce pain.  Wants to walk for exercise, ride a bike.    Currently in Pain? Yes    Pain Score 3     Pain  Location Knee    Pain Orientation Left    Pain Descriptors / Indicators Sore;Tightness    Pain Type Surgical pain    Pain Onset More than a month ago              [facility] PT Assessment - 04/25/20 0001      Assessment   Medical Diagnosis S/P L TKA    Referring Provider (PT) Dr. Lorin Mercy      AROM   Left Knee Extension 10    Left Knee Flexion 100      Timed Up and Go Test   TUG Comments First trial: 17.4 seconds using st cane, 2nd trial: 15.4 seconds with st cane.                          [facility] Adult PT Treatment/Exercise - 04/25/20 0001      Knee/Hip Exercises: Stretches   Active Hamstring Stretch Left;2 reps;30 seconds    Active Hamstring Stretch Limitations seated, using strap      Knee/Hip Exercises: Aerobic   Recumbent Bike L2 x 6 minutes      Knee/Hip Exercises: Machines for Strengthening   Cybex Knee Flexion 25# bilateral LE's x 30 reps    Total Gym Leg Press shuttle SL press 2 x 15 62#,      Knee/Hip Exercises: Standing   Forward Step Up Right;Left;15 reps;Hand Hold: 1      Knee/Hip Exercises: Seated   Long Arc Quad Strengthening;Left    Long Arc Quad Weight 5 lbs.    Sit to [place] 2 sets;10 reps;with UE support   once pt began she no longer needed UE support     Manual Therapy   Manual therapy comments rolling Left IT band and lateral hip using a tennis ball    Joint Mobilization tibiofemoral mobs grade III AP, and tibial ER combined with quad set for screw home mechanism                       PT Long Term Goals - 04/25/20 1516      PT LONG TERM GOAL #1   Title Patient will demonstrate/report pain at worst less than or equal to 2/10 to facilitate minimal limitation in daily activity secondary to pain symptoms.    Time 8    Period Weeks    Status On-going      PT LONG TERM GOAL #2   Title Patient will demonstrate independent use of home exercise program to facilitate ability to maintain/progress functional gains from skilled  physical therapy services.    Time 8    Period Weeks      PT LONG TERM GOAL #3   Title Patient will demonstrate independent ambulation community distances > 300  ft to facilitate community integration at [facility].    Baseline 3+/5 to 4-/5    Period Weeks    Status On-going      PT LONG TERM GOAL #4   Title Patient will demonstrate Lt knee AROM 0-110 degrees to facilitate ability to perform transfers, sitting, ambulation, stair navigation s restriction due to mobility.    Baseline goes slow, uses rail and cane    Period Weeks    Status On-going      PT LONG TERM GOAL #5   Title Patient will demonstrate Bilateral LE MMT measured at 5/5 throughout to facilitate ability to perform usual standing, walking, stairs at PLOF s limitation due to symptoms.    Period Weeks    Status On-going                 Plan - 04/25/20 1514    Clinical Impression Statement Pt tolerating treatment well today making progress with strength gains. Pt's ROM arc 10-100 degrees in her R knee. Continue skilled PT to progress toward pt's PLOF.    Personal Factors and Comorbidities Comorbidity 3+    Comorbidities bilateral THA, most recent 08/2019.  Chronic renal disease, HTN, obesity    Examination-Activity Limitations Transfers;Locomotion Level;Squat;Stairs;Stand;Sit;Bed Mobility;Bend;Carry    Stability/Clinical Decision Making Evolving/Moderate complexity    PT Frequency 2x / week    PT Duration 8 weeks    PT Treatment/Interventions ADLs/Self Care Home Management;Electrical Stimulation;Iontophoresis 4mg /ml Dexamethasone;Moist Heat;Traction;Balance training;Therapeutic exercise;Therapeutic activities;Functional mobility training;Stair training;Gait training;Ultrasound;Neuromuscular re-education;Patient/family education;Wheelchair mobility training;Manual techniques;Manual lymph drainage;Vasopneumatic Device;Taping;Dry needling;Passive range of motion;Spinal Manipulations;Joint Manipulations    PT Next Visit Plan  Recommend continued skilled PT.  Working to transition to 1x/week as appropriate closer to d/c timing.    PT Home Exercise Plan [id number]    Consulted and Agree with Plan of Care Patient           Patient will benefit from skilled therapeutic intervention in order to improve the following deficits and impairments:  Abnormal gait, Decreased endurance, Hypomobility, Increased edema, Decreased activity tolerance, Decreased strength, Pain, Difficulty walking, Decreased mobility, Decreased balance, Decreased range of motion, Impaired perceived functional ability, Impaired flexibility, Decreased coordination  Visit Diagnosis: Chronic pain of left knee  Muscle weakness (generalized)  Difficulty in walking, not elsewhere classified  Localized edema  Stiffness of left knee, not elsewhere classified     Problem List Patient Active Problem List   Diagnosis Date Noted  . S/P total knee arthroplasty, left 03/09/2020  . History of total hip arthroplasty, right 08/21/2019  . Intermittent claudication ([place]) 07/08/2019  . Hypertensive nephropathy 12/15/2018  . Chronic renal disease, stage II 12/15/2018  . H/O total hip arthroplasty, left 11/07/2017  . Presence of left artificial hip joint 05/14/2017  . Preop cardiovascular exam 03/12/2017  . Cough   . Abdominal pain   . Symptomatic cholelithiasis 07/01/2016  . Nonspecific ST-T wave electrocardiographic changes   . Lower leg edema 02/16/2015  . Obesity (BMI 30.0-34.9) 02/16/2015  . Essential hypertension 01/11/2012  . Hyperthyroidism 01/11/2012  . Hypercholesteremia 01/11/2012  . H/O vaginal hysterectomy 1974 01/11/2012    Class: History of  . S/P removal of lung  partial R lobe  2010 01/11/2012  . History of hernia repair  R ing. 1975 01/11/2012  . BACK PAIN 11/12/2007  . DYSPNEA 11/11/2007    Lurene Shadow, MPT 04/25/2020, 3:28 PM  [facility] [address] [place], [place],  [address] Phone: [phone]   Fax:  [phone]  Name: BOBI DAUDELIN MRN: [id number] Date of Birth: [birth year]

## 2020-04-27 ENCOUNTER — Other Ambulatory Visit: Payer: Self-pay

## 2020-04-27 ENCOUNTER — Ambulatory Visit (INDEPENDENT_AMBULATORY_CARE_PROVIDER_SITE_OTHER): Payer: Medicare Other | Admitting: Physical Therapy

## 2020-04-27 DIAGNOSIS — M25562 Pain in left knee: Secondary | ICD-10-CM | POA: Diagnosis not present

## 2020-04-27 DIAGNOSIS — R262 Difficulty in walking, not elsewhere classified: Secondary | ICD-10-CM

## 2020-04-27 DIAGNOSIS — R600 Localized edema: Secondary | ICD-10-CM

## 2020-04-27 DIAGNOSIS — G8929 Other chronic pain: Secondary | ICD-10-CM

## 2020-04-27 DIAGNOSIS — M6281 Muscle weakness (generalized): Secondary | ICD-10-CM

## 2020-04-27 DIAGNOSIS — M25662 Stiffness of left knee, not elsewhere classified: Secondary | ICD-10-CM | POA: Diagnosis not present

## 2020-04-27 NOTE — Therapy (Signed)
[facility] [place], [place], [address] Phone: [phone]   Fax:  [phone]  Physical Therapy Treatment/MD progress note  Patient Details  Name: [name] MRN: [id number] Date of Birth: [birth year] Referring Provider (PT): Dr. Lorin Mercy   Encounter Date: 04/27/2020   PT End of Session - 04/27/20 1335    Visit Number 13    Number of Visits 16    Date for PT Re-Evaluation 05/05/20    Authorization Time Period POC 03/10/2020 - 05/05/2020    Progress Note Due on Visit 20    PT Start Time 1300    PT Stop Time 1340    PT Time Calculation (min) 40 min    Activity Tolerance Patient tolerated treatment well    Behavior During Therapy [employer] for tasks assessed/performed           Past Medical History:  Diagnosis Date  . Aortic stenosis    Echo 07/05/16: Very mild AS, Mean gradient 10 mm Hg, Peak gradient (S) 19 mmHg   . Arthritis    left hip and back  . Bronchiectasis    isolated to RML; status post right middle lobe partial resection.  . Complication of anesthesia    hard to wake up  . Dyspnea   . Gall stones   . GERD (gastroesophageal reflux disease)    history   . H/O osteoporosis   . Hypertension   . Hyperthyroidism    endocrinologist - Dr. Chalmers Cater  . Pneumonia   . Yeast infection     Past Surgical History:  Procedure Laterality Date  . ABDOMINAL HYSTERECTOMY  1974  . CHOLECYSTECTOMY N/A 07/02/2016   Procedure: LAPAROSCOPIC CHOLECYSTECTOMY;  Surgeon: Stark Klein, MD;  Location: [place];  Service: General;  Laterality: N/A;  . COLONOSCOPY    . ENDOSCOPIC RETROGRADE CHOLANGIOPANCREATOGRAPHY (ERCP) WITH PROPOFOL N/A 08/07/2018   Procedure: ENDOSCOPIC RETROGRADE CHOLANGIOPANCREATOGRAPHY (ERCP) WITH PROPOFOL;  Surgeon: Carol Ada, MD;  [facility];  Service: Endoscopy;  Laterality: N/A;  . EYE SURGERY     stye removed  . HARDWARE REMOVAL Left 02/24/2020   Procedure: Hardware Removal, left knee;  Surgeon: [name], MD;  Location: [place];  Service: Orthopedics;  Laterality: Left;  . HERNIA REPAIR  1975  . KNEE SURGERY Left   . LUNG REMOVAL, PARTIAL  208   RML  . NM MYOVIEW LTD  06/2011   Normal EF. No ischemia or infarction.  [name]  08/07/2018   Procedure: [name];  Surgeon: Carol Ada, MD;  [facility];  Service: Endoscopy;;  balloon sweep  . STERIOD INJECTION Right 02/24/2020   Procedure: RIGHT KNEE STEROID INTRA-ARTICULAR MARCAINE/DEPO-MEDROL INJECTION UNDER ANESTHESIA;  Surgeon: [name], MD;  Location: [place];  Service: Orthopedics;  Laterality: Right;  . TOTAL HIP ARTHROPLASTY Left 04/05/2017   Procedure: LEFT TOTAL HIP ARTHROPLASTY ANTERIOR APPROACH;  Surgeon: [name], MD;  Location: [facility];  Service: Orthopedics;  Laterality: Left;  . TOTAL HIP ARTHROPLASTY Right 12/19/2018  . TOTAL HIP ARTHROPLASTY Right 12/19/2018   Procedure: RIGHT TOTAL HIP ARTHROPLASTY-DIRECT ANTERIOR APPROACH;  Surgeon: [name], MD;  Location: [facility];  Service: Orthopedics;  Laterality: Right;  . TOTAL KNEE ARTHROPLASTY Left 02/24/2020   Procedure: LEFT TOTAL KNEE ARTHROPLASTY;  Surgeon: [name], MD;  Location: [facility];  Service: Orthopedics;  Laterality: Left;  . TRANSTHORACIC ECHOCARDIOGRAM  06/2011   Mild concentric LVH. Normal EF with impaired relaxation. Mildly elevated RV pressures of  30 and 40 mmHg.  Marland Kitchen TRANSTHORACIC ECHOCARDIOGRAM  06/2016   normal LV size and function. EF 60-65% with GRD 2 DD. Mild aortic stenosis. Mild LA dilation. Mild to moderately increased PA pressures (46 mmHg).  . TUBAL LIGATION      There were no vitals filed for this visit.   Subjective Assessment - 04/27/20 1323    Subjective Relays about 3-4 out 10 10 pain overall that is now only intermittent    Patient is accompained by: Family member    Limitations Walking;Standing;House hold activities    Patient Stated Goals Reduce pain.  Wants to walk for exercise, ride a bike.    Pain  Onset More than a month ago              [facility] PT Assessment - 04/27/20 0001      Assessment   Medical Diagnosis S/P L TKA    Referring Provider (PT) Dr. Lorin Mercy      AROM   Left Knee Extension 5    Left Knee Flexion 105      PROM   Left Knee Extension -3    Left Knee Flexion 110      Strength   Left Knee Flexion 5/5    Left Knee Extension 4+/5                         [facility] Adult PT Treatment/Exercise - 04/27/20 0001      Knee/Hip Exercises: Stretches   Active Hamstring Stretch Left;2 reps;30 seconds    Other Knee/Hip Stretches seated knee flexion stretch with self O.P from other leg 10 sec X 15 reps      Knee/Hip Exercises: Aerobic   Recumbent Bike L2 x 6 minutes      Knee/Hip Exercises: Machines for Strengthening   Cybex Knee Flexion 25# bilateral LE's 2x15reps    Total Gym Leg Press shuttle SL press 3X10 62#,      Knee/Hip Exercises: Standing   Forward Step Up Left;Hand Hold: 1;Step Height: 6";10 reps      Knee/Hip Exercises: Seated   Sit to Sand 2 sets;10 reps;without UE support   from slightly raised surface     Manual Therapy   Other Manual Therapy Lt leg PROM, flexion and extension mobs, patella mobs, manual hamstring stretching                       PT Long Term Goals - 04/27/20 1338      PT LONG TERM GOAL #1   Title Patient will demonstrate/report pain at worst less than or equal to 2/10 to facilitate minimal limitation in daily activity secondary to pain symptoms.    Time 8    Period Weeks    Status On-going      PT LONG TERM GOAL #2   Title Patient will demonstrate independent use of home exercise program to facilitate ability to maintain/progress functional gains from skilled physical therapy services.    Time 8    Period Weeks    Status On-going      PT LONG TERM GOAL #3   Title Patient will demonstrate independent ambulation community distances > 300 ft to facilitate community integration at [facility].    Baseline --     Period Weeks    Status Achieved      PT LONG TERM GOAL #4   Title Patient will demonstrate Lt knee AROM 0-110 degrees to facilitate ability to perform  transfers, sitting, ambulation, stair navigation s restriction due to mobility.    Baseline 5-105    Period Weeks    Status On-going      PT LONG TERM GOAL #5   Title Patient will demonstrate Bilateral LE MMT measured at 5/5 throughout to facilitate ability to perform usual standing, walking, stairs at PLOF s limitation due to symptoms.    Baseline 4+    Period Weeks    Status On-going                 Plan - 04/27/20 1354    Clinical Impression Statement Updated measurements show improvments in Lt knee strength and ROM but still with mild deficits in these areas and will continue to benefit from PT. PT recommending 2-3 more weeks of PT to progress as able toward her functional goals.    Personal Factors and Comorbidities Comorbidity 3+    Comorbidities bilateral THA, most recent 08/2019.  Chronic renal disease, HTN, obesity    Examination-Activity Limitations Transfers;Locomotion Level;Squat;Stairs;Stand;Sit;Bed Mobility;Bend;Carry    Stability/Clinical Decision Making Evolving/Moderate complexity    PT Frequency 2x / week    PT Duration 8 weeks    PT Treatment/Interventions ADLs/Self Care Home Management;Electrical Stimulation;Iontophoresis 4mg /ml Dexamethasone;Moist Heat;Traction;Balance training;Therapeutic exercise;Therapeutic activities;Functional mobility training;Stair training;Gait training;Ultrasound;Neuromuscular re-education;Patient/family education;Wheelchair mobility training;Manual techniques;Manual lymph drainage;Vasopneumatic Device;Taping;Dry needling;Passive range of motion;Spinal Manipulations;Joint Manipulations    PT Next Visit Plan Recommend continued skilled PT 2-3 more weeks.  Working to transition to 1x/week as appropriate closer to d/c timing.    PT Home Exercise Plan [id number]    Consulted and Agree with  Plan of Care Patient           Patient will benefit from skilled therapeutic intervention in order to improve the following deficits and impairments:  Abnormal gait, Decreased endurance, Hypomobility, Increased edema, Decreased activity tolerance, Decreased strength, Pain, Difficulty walking, Decreased mobility, Decreased balance, Decreased range of motion, Impaired perceived functional ability, Impaired flexibility, Decreased coordination  Visit Diagnosis: Chronic pain of left knee  Muscle weakness (generalized)  Difficulty in walking, not elsewhere classified  Localized edema  Stiffness of left knee, not elsewhere classified     Problem List Patient Active Problem List   Diagnosis Date Noted  . S/P total knee arthroplasty, left 03/09/2020  . History of total hip arthroplasty, right 08/21/2019  . Intermittent claudication ([place]) 07/08/2019  . Hypertensive nephropathy 12/15/2018  . Chronic renal disease, stage II 12/15/2018  . H/O total hip arthroplasty, left 11/07/2017  . Presence of left artificial hip joint 05/14/2017  . Preop cardiovascular exam 03/12/2017  . Cough   . Abdominal pain   . Symptomatic cholelithiasis 07/01/2016  . Nonspecific ST-T wave electrocardiographic changes   . Lower leg edema 02/16/2015  . Obesity (BMI 30.0-34.9) 02/16/2015  . Essential hypertension 01/11/2012  . Hyperthyroidism 01/11/2012  . Hypercholesteremia 01/11/2012  . H/O vaginal hysterectomy 1974 01/11/2012    Class: History of  . S/P removal of lung  partial R lobe  2010 01/11/2012  . History of hernia repair  R ing. 1975 01/11/2012  . BACK PAIN 11/12/2007  . DYSPNEA 11/11/2007    Silvestre Mesi 04/27/2020, 1:56 PM  [facility] [address] [place], [place], [address] Phone: [phone]   Fax:  [phone]  Name: ABEGAIL KLOEPPEL MRN: [id number] Date of Birth: 04/27/1933

## 2020-05-04 ENCOUNTER — Encounter: Payer: Self-pay | Admitting: Rehabilitative and Restorative Service Providers"

## 2020-05-04 ENCOUNTER — Ambulatory Visit (INDEPENDENT_AMBULATORY_CARE_PROVIDER_SITE_OTHER): Payer: Medicare Other | Admitting: Orthopaedic Surgery

## 2020-05-04 ENCOUNTER — Ambulatory Visit (INDEPENDENT_AMBULATORY_CARE_PROVIDER_SITE_OTHER): Payer: Medicare Other | Admitting: Rehabilitative and Restorative Service Providers"

## 2020-05-04 ENCOUNTER — Encounter: Payer: Self-pay | Admitting: Orthopaedic Surgery

## 2020-05-04 ENCOUNTER — Other Ambulatory Visit: Payer: Self-pay

## 2020-05-04 DIAGNOSIS — R262 Difficulty in walking, not elsewhere classified: Secondary | ICD-10-CM

## 2020-05-04 DIAGNOSIS — M6281 Muscle weakness (generalized): Secondary | ICD-10-CM | POA: Diagnosis not present

## 2020-05-04 DIAGNOSIS — G8929 Other chronic pain: Secondary | ICD-10-CM | POA: Diagnosis not present

## 2020-05-04 DIAGNOSIS — Z96652 Presence of left artificial knee joint: Secondary | ICD-10-CM

## 2020-05-04 DIAGNOSIS — M25562 Pain in left knee: Secondary | ICD-10-CM | POA: Diagnosis not present

## 2020-05-04 DIAGNOSIS — M25662 Stiffness of left knee, not elsewhere classified: Secondary | ICD-10-CM | POA: Diagnosis not present

## 2020-05-04 DIAGNOSIS — R600 Localized edema: Secondary | ICD-10-CM

## 2020-05-04 NOTE — Progress Notes (Signed)
Post-Op Visit Note   Patient: [name]           Date of Birth: March 28, 1933           MRN: [id number] Visit Date: 05/04/2020 PCP: Glendale Chard, MD   Assessment & Plan: Post total knee arthroplasty.  She still lacks 5 to 10 degrees reaching full extension her flexion is to 115.  She requested more pain medication and I reviewed with her she has had 120 tablets of 7.5 Norco.  She has had 30 tablets of 5 mg Norco.  She has had 120 Toradol tablets total and this is just since her surgery.  She needs to stop pain medication and get full knee extension and get her quad little bit stronger and then she will have resolution of her pain.  We demonstrated went over prone positioning again using a book bag.  We went over this last time but she has not done it. Chief Complaint:  Chief Complaint  Patient presents with  . Left Knee - Follow-up   Visit Diagnoses:  1. S/P total knee arthroplasty, left     Plan: Patient can continue using ice she needs to get the last few degrees of extension she has been taking 3 tablets of pain every day now for more than 2 months then needs to follow the exercises that I gone over with her to get a satisfactory result in the job resolution of her pain.  She will return to see me in 1 month.  Follow-Up Instructions: Return in about 1 month (around 06/04/2020).   Orders:  No orders of the defined types were placed in this encounter.  No orders of the defined types were placed in this encounter.   Imaging: No results found.  PMFS History: Patient Active Problem List   Diagnosis Date Noted  . S/P total knee arthroplasty, left 03/09/2020  . History of total hip arthroplasty, right 08/21/2019  . Intermittent claudication ([place]) 07/08/2019  . Hypertensive nephropathy 12/15/2018  . Chronic renal disease, stage II 12/15/2018  . H/O total hip arthroplasty, left 11/07/2017  . Presence of left artificial hip joint 05/14/2017  . Preop cardiovascular exam  03/12/2017  . Cough   . Abdominal pain   . Symptomatic cholelithiasis 07/01/2016  . Nonspecific ST-T wave electrocardiographic changes   . Lower leg edema 02/16/2015  . Obesity (BMI 30.0-34.9) 02/16/2015  . Essential hypertension 01/11/2012  . Hyperthyroidism 01/11/2012  . Hypercholesteremia 01/11/2012  . H/O vaginal hysterectomy 1974 01/11/2012    Class: History of  . S/P removal of lung  partial R lobe  2010 01/11/2012  . History of hernia repair  R ing. 1975 01/11/2012  . BACK PAIN 11/12/2007  . DYSPNEA 11/11/2007   Past Medical History:  Diagnosis Date  . Aortic stenosis    Echo 07/05/16: Very mild AS, Mean gradient 10 mm Hg, Peak gradient (S) 19 mmHg   . Arthritis    left hip and back  . Bronchiectasis    isolated to RML; status post right middle lobe partial resection.  . Complication of anesthesia    hard to wake up  . Dyspnea   . Gall stones   . GERD (gastroesophageal reflux disease)    history   . H/O osteoporosis   . Hypertension   . Hyperthyroidism    endocrinologist - Dr. Chalmers Cater  . Pneumonia   . Yeast infection     Family History  Problem Relation Age of Onset  . Hypertension  Mother   . CVA Mother 36  . Heart attack Sister   . Diabetes Sister   . Healthy Father   . Heart attack Brother   . Heart attack Brother     Past Surgical History:  Procedure Laterality Date  . ABDOMINAL HYSTERECTOMY  1974  . CHOLECYSTECTOMY N/A 07/02/2016   Procedure: LAPAROSCOPIC CHOLECYSTECTOMY;  Surgeon: Stark Klein, MD;  Location: [place];  Service: General;  Laterality: N/A;  . COLONOSCOPY    . ENDOSCOPIC RETROGRADE CHOLANGIOPANCREATOGRAPHY (ERCP) WITH PROPOFOL N/A 08/07/2018   Procedure: ENDOSCOPIC RETROGRADE CHOLANGIOPANCREATOGRAPHY (ERCP) WITH PROPOFOL;  Surgeon: Carol Ada, MD;  [facility];  Service: Endoscopy;  Laterality: N/A;  . EYE SURGERY     stye removed  . HARDWARE REMOVAL Left 02/24/2020   Procedure: Hardware Removal, left knee;  Surgeon: [name], MD;  Location: [place];  Service: Orthopedics;  Laterality: Left;  . HERNIA REPAIR  1975  . KNEE SURGERY Left   . LUNG REMOVAL, PARTIAL  208   RML  . NM MYOVIEW LTD  06/2011   Normal EF. No ischemia or infarction.  [name]  08/07/2018   Procedure: [name];  Surgeon: Carol Ada, MD;  [facility];  Service: Endoscopy;;  balloon sweep  . STERIOD INJECTION Right 02/24/2020   Procedure: RIGHT KNEE STEROID INTRA-ARTICULAR MARCAINE/DEPO-MEDROL INJECTION UNDER ANESTHESIA;  Surgeon: [name], MD;  Location: [place];  Service: Orthopedics;  Laterality: Right;  . TOTAL HIP ARTHROPLASTY Left 04/05/2017   Procedure: LEFT TOTAL HIP ARTHROPLASTY ANTERIOR APPROACH;  Surgeon: [name], MD;  Location: [place];  Service: Orthopedics;  Laterality: Left;  . TOTAL HIP ARTHROPLASTY Right 12/19/2018  . TOTAL HIP ARTHROPLASTY Right 12/19/2018   Procedure: RIGHT TOTAL HIP ARTHROPLASTY-DIRECT ANTERIOR APPROACH;  Surgeon: [name], MD;  Location: [place];  Service: Orthopedics;  Laterality: Right;  . TOTAL KNEE ARTHROPLASTY Left 02/24/2020   Procedure: LEFT TOTAL KNEE ARTHROPLASTY;  Surgeon: [name], MD;  Location: [place];  Service: Orthopedics;  Laterality: Left;  . TRANSTHORACIC ECHOCARDIOGRAM  06/2011   Mild concentric LVH. Normal EF with impaired relaxation. Mildly elevated RV pressures of 30 and 40 mmHg.  Marland Kitchen TRANSTHORACIC ECHOCARDIOGRAM  06/2016   normal LV size and function. EF 60-65% with GRD 2 DD. Mild aortic stenosis. Mild LA dilation. Mild to moderately increased PA pressures (46 mmHg).  . TUBAL LIGATION     Social History   Occupational History  . Occupation: retired  Tobacco Use  . Smoking status: Never Smoker  . Smokeless tobacco: Never Used  Vaping Use  . Vaping Use: Never used  Substance and Sexual Activity  . Alcohol use: No  . Drug use: No  . Sexual activity: Not Currently

## 2020-05-04 NOTE — Therapy (Signed)
[place], [place], [address] Phone: [phone]   Fax:  [phone]  Physical Therapy Treatment  Patient Details  Name: [name] MRN: [id number] Date of Birth: Nov 27, 1932 Referring Provider (PT): Dr. Lorin Mercy   Encounter Date: 05/04/2020   PT End of Session - 05/04/20 1628    Visit Number 14    Number of Visits 16    Date for PT Re-Evaluation 05/05/20    Authorization Time Period POC 03/10/2020 - 05/05/2020    Authorization - Visit Number 14    Progress Note Due on Visit 20    PT Start Time 1604    PT Stop Time 1645    PT Time Calculation (min) 41 min    Activity Tolerance Patient tolerated treatment well    Behavior During Therapy [facility] for tasks assessed/performed           Past Medical History:  Diagnosis Date  . Aortic stenosis    Echo 07/05/16: Very mild AS, Mean gradient 10 mm Hg, Peak gradient (S) 19 mmHg   . Arthritis    left hip and back  . Bronchiectasis    isolated to RML; status post right middle lobe partial resection.  . Complication of anesthesia    hard to wake up  . Dyspnea   . Gall stones   . GERD (gastroesophageal reflux disease)    history   . H/O osteoporosis   . Hypertension   . Hyperthyroidism    endocrinologist - Dr. Chalmers Cater  . Pneumonia   . Yeast infection     Past Surgical History:  Procedure Laterality Date  . ABDOMINAL HYSTERECTOMY  1974  . CHOLECYSTECTOMY N/A 07/02/2016   Procedure: LAPAROSCOPIC CHOLECYSTECTOMY;  Surgeon: Stark Klein, MD;  Location: [facility];  Service: General;  Laterality: N/A;  . COLONOSCOPY    . ENDOSCOPIC RETROGRADE CHOLANGIOPANCREATOGRAPHY (ERCP) WITH PROPOFOL N/A 08/07/2018   Procedure: ENDOSCOPIC RETROGRADE CHOLANGIOPANCREATOGRAPHY (ERCP) WITH PROPOFOL;  Surgeon: Carol Ada, MD;  [facility];  Service: Endoscopy;  Laterality: N/A;  . EYE SURGERY     stye removed  . HARDWARE REMOVAL Left 02/24/2020   Procedure: Hardware Removal, left  knee;  Surgeon: [name], MD;  Location: [facility];  Service: Orthopedics;  Laterality: Left;  . HERNIA REPAIR  1975  . KNEE SURGERY Left   . LUNG REMOVAL, PARTIAL  208   RML  . NM MYOVIEW LTD  06/2011   Normal EF. No ischemia or infarction.  [name]  08/07/2018   Procedure: [name];  Surgeon: Carol Ada, MD;  [facility];  Service: Endoscopy;;  balloon sweep  . STERIOD INJECTION Right 02/24/2020   Procedure: RIGHT KNEE STEROID INTRA-ARTICULAR MARCAINE/DEPO-MEDROL INJECTION UNDER ANESTHESIA;  Surgeon: [name], MD;  Location: [facility];  Service: Orthopedics;  Laterality: Right;  . TOTAL HIP ARTHROPLASTY Left 04/05/2017   Procedure: LEFT TOTAL HIP ARTHROPLASTY ANTERIOR APPROACH;  Surgeon: [name], MD;  Location: [facility];  Service: Orthopedics;  Laterality: Left;  . TOTAL HIP ARTHROPLASTY Right 12/19/2018  . TOTAL HIP ARTHROPLASTY Right 12/19/2018   Procedure: RIGHT TOTAL HIP ARTHROPLASTY-DIRECT ANTERIOR APPROACH;  Surgeon: [name], MD;  Location: [facility];  Service: Orthopedics;  Laterality: Right;  . TOTAL KNEE ARTHROPLASTY Left 02/24/2020   Procedure: LEFT TOTAL KNEE ARTHROPLASTY;  Surgeon: [name], MD;  Location: [facility];  Service: Orthopedics;  Laterality: Left;  . TRANSTHORACIC ECHOCARDIOGRAM  06/2011   Mild concentric LVH. Normal EF with impaired  relaxation. Mildly elevated RV pressures of 30 and 40 mmHg.  Marland Kitchen TRANSTHORACIC ECHOCARDIOGRAM  06/2016   normal LV size and function. EF 60-65% with GRD 2 DD. Mild aortic stenosis. Mild LA dilation. Mild to moderately increased PA pressures (46 mmHg).  . TUBAL LIGATION      There were no vitals filed for this visit.   Subjective Assessment - 05/04/20 1625    Subjective Pt. stated feeling some intermittent pain today.  Front of knee pain and tight.  Saw MD today and was "fussed at" about motion again.    Patient is accompained by: Family member    Limitations Walking;Standing;House hold activities     Patient Stated Goals Reduce pain.  Wants to walk for exercise, ride a bike.    Currently in Pain? Yes    Pain Score 3     Pain Location Knee    Pain Orientation Left    Pain Onset More than a month ago    Pain Frequency Intermittent    Aggravating Factors  insidious, stiffness after sitting    Pain Relieving Factors resting, medicine                             [facility] Adult PT Treatment/Exercise - 05/04/20 0001      Self-Care   Self-Care Other Self-Care Comments    Other Self-Care Comments  Additional time spent in education of HEP, instruction for frequency, duration and importance of use.  Verbal cues given and demonstration as well.  Emphasis on TKE.      Knee/Hip Exercises: Stretches   Gastroc Stretch 3 reps;30 seconds   incline board     Knee/Hip Exercises: Aerobic   Recumbent Bike 3/4 rev 3 mins, full rev 6 mins      Knee/Hip Exercises: Seated   Long Arc Quad AROM;Strengthening;Left   3 x 10 Lt LE   Other Seated Knee/Hip Exercises seated SLR 3 x 10 Lt LE    Sit to Sand 2 sets;10 reps;without UE support   21 inch chair                      PT Long Term Goals - 04/27/20 1338      PT LONG TERM GOAL #1   Title Patient will demonstrate/report pain at worst less than or equal to 2/10 to facilitate minimal limitation in daily activity secondary to pain symptoms.    Time 8    Period Weeks    Status On-going      PT LONG TERM GOAL #2   Title Patient will demonstrate independent use of home exercise program to facilitate ability to maintain/progress functional gains from skilled physical therapy services.    Time 8    Period Weeks    Status On-going      PT LONG TERM GOAL #3   Title Patient will demonstrate independent ambulation community distances > 300 ft to facilitate community integration at [facility].    Baseline --    Period Weeks    Status Achieved      PT LONG TERM GOAL #4   Title Patient will demonstrate Lt knee AROM 0-110 degrees to  facilitate ability to perform transfers, sitting, ambulation, stair navigation s restriction due to mobility.    Baseline 5-105    Period Weeks    Status On-going      PT LONG TERM GOAL #5   Title Patient will demonstrate Bilateral  LE MMT measured at 5/5 throughout to facilitate ability to perform usual standing, walking, stairs at PLOF s limitation due to symptoms.    Baseline 4+    Period Weeks    Status On-going                 Plan - 05/04/20 1630    Clinical Impression Statement Spent additional time today reviewing previous cues for HEP for mobility, TKE, and strength related to Lt LE.  Pt. recall on HEP review was poor to fair at this time c cues required for improvement of recall.  Pt. to benefit from marked improvement in HEP application in conjuction c skilled PT services.    Personal Factors and Comorbidities Comorbidity 3+    Comorbidities bilateral THA, most recent 08/2019.  Chronic renal disease, HTN, obesity    Examination-Activity Limitations Transfers;Locomotion Level;Squat;Stairs;Stand;Sit;Bed Mobility;Bend;Carry    Stability/Clinical Decision Making Evolving/Moderate complexity    PT Frequency 2x / week    PT Duration 8 weeks    PT Treatment/Interventions ADLs/Self Care Home Management;Electrical Stimulation;Iontophoresis 4mg /ml Dexamethasone;Moist Heat;Traction;Balance training;Therapeutic exercise;Therapeutic activities;Functional mobility training;Stair training;Gait training;Ultrasound;Neuromuscular re-education;Patient/family education;Wheelchair mobility training;Manual techniques;Manual lymph drainage;Vasopneumatic Device;Taping;Dry needling;Passive range of motion;Spinal Manipulations;Joint Manipulations    PT Next Visit Plan Recert next visit.    PT Home Exercise Plan [id number]    Consulted and Agree with Plan of Care Patient           Patient will benefit from skilled therapeutic intervention in order to improve the following deficits and impairments:   Abnormal gait, Decreased endurance, Hypomobility, Increased edema, Decreased activity tolerance, Decreased strength, Pain, Difficulty walking, Decreased mobility, Decreased balance, Decreased range of motion, Impaired perceived functional ability, Impaired flexibility, Decreased coordination  Visit Diagnosis: Chronic pain of left knee  Muscle weakness (generalized)  Difficulty in walking, not elsewhere classified  Localized edema  Stiffness of left knee, not elsewhere classified     Problem List Patient Active Problem List   Diagnosis Date Noted  . S/P total knee arthroplasty, left 03/09/2020  . History of total hip arthroplasty, right 08/21/2019  . Intermittent claudication ([place]) 07/08/2019  . Hypertensive nephropathy 12/15/2018  . Chronic renal disease, stage II 12/15/2018  . H/O total hip arthroplasty, left 11/07/2017  . Presence of left artificial hip joint 05/14/2017  . Preop cardiovascular exam 03/12/2017  . Cough   . Abdominal pain   . Symptomatic cholelithiasis 07/01/2016  . Nonspecific ST-T wave electrocardiographic changes   . Lower leg edema 02/16/2015  . Obesity (BMI 30.0-34.9) 02/16/2015  . Essential hypertension 01/11/2012  . Hyperthyroidism 01/11/2012  . Hypercholesteremia 01/11/2012  . H/O vaginal hysterectomy 1974 01/11/2012    Class: History of  . S/P removal of lung  partial R lobe  2010 01/11/2012  . History of hernia repair  R ing. 1975 01/11/2012  . BACK PAIN 11/12/2007  . DYSPNEA 11/11/2007    Scot Jun, PT, DPT, OCS, ATC 05/04/20  4:47 PM    [facility] [address] [place], [place], [address] Phone: [phone]   Fax:  [phone]  Name: [name] MRN: [id number] Date of Birth: 05/13/1933

## 2020-05-06 ENCOUNTER — Other Ambulatory Visit: Payer: Self-pay | Admitting: Internal Medicine

## 2020-05-12 ENCOUNTER — Other Ambulatory Visit: Payer: Self-pay

## 2020-05-12 ENCOUNTER — Ambulatory Visit (INDEPENDENT_AMBULATORY_CARE_PROVIDER_SITE_OTHER): Payer: Medicare Other | Admitting: Rehabilitative and Restorative Service Providers"

## 2020-05-12 DIAGNOSIS — M6281 Muscle weakness (generalized): Secondary | ICD-10-CM | POA: Diagnosis not present

## 2020-05-12 DIAGNOSIS — R600 Localized edema: Secondary | ICD-10-CM

## 2020-05-12 DIAGNOSIS — M25562 Pain in left knee: Secondary | ICD-10-CM | POA: Diagnosis not present

## 2020-05-12 DIAGNOSIS — M25662 Stiffness of left knee, not elsewhere classified: Secondary | ICD-10-CM | POA: Diagnosis not present

## 2020-05-12 DIAGNOSIS — G8929 Other chronic pain: Secondary | ICD-10-CM

## 2020-05-12 DIAGNOSIS — R262 Difficulty in walking, not elsewhere classified: Secondary | ICD-10-CM

## 2020-05-12 NOTE — Therapy (Signed)
[place], [place], [address] Phone: [phone]   Fax:  [phone]  Physical Therapy Treatment/Progress Note/Recert  Patient Details  Name: [name] MRN: [id number] Date of Birth: [birth year] Referring Provider (PT): Dr. Lorin Mercy   Encounter Date: 05/12/2020   Progress Note Reporting Period 04/09/2020 to 05/12/2020  See note below for Objective Data and Assessment of Progress/Goals.        PT End of Session - 05/12/20 1554    Visit Number 15    Number of Visits 18    Date for PT Re-Evaluation 06/09/20    Authorization Time Period 05/12/2020 - 06/09/2020 POC    Authorization - Visit Number 15    Progress Note Due on Visit 25    PT Start Time 1600    PT Stop Time 1640    PT Time Calculation (min) 40 min    Activity Tolerance Patient tolerated treatment well    Behavior During Therapy [facility] for tasks assessed/performed           Past Medical History:  Diagnosis Date  . Aortic stenosis    Echo 07/05/16: Very mild AS, Mean gradient 10 mm Hg, Peak gradient (S) 19 mmHg   . Arthritis    left hip and back  . Bronchiectasis    isolated to RML; status post right middle lobe partial resection.  . Complication of anesthesia    hard to wake up  . Dyspnea   . Gall stones   . GERD (gastroesophageal reflux disease)    history   . H/O osteoporosis   . Hypertension   . Hyperthyroidism    endocrinologist - Dr. Chalmers Cater  . Pneumonia   . Yeast infection     Past Surgical History:  Procedure Laterality Date  . ABDOMINAL HYSTERECTOMY  1974  . CHOLECYSTECTOMY N/A 07/02/2016   Procedure: LAPAROSCOPIC CHOLECYSTECTOMY;  Surgeon: Stark Klein, MD;  Location: [facility];  Service: General;  Laterality: N/A;  . COLONOSCOPY    . ENDOSCOPIC RETROGRADE CHOLANGIOPANCREATOGRAPHY (ERCP) WITH PROPOFOL N/A 08/07/2018   Procedure: ENDOSCOPIC RETROGRADE CHOLANGIOPANCREATOGRAPHY (ERCP) WITH PROPOFOL;  Surgeon: Carol Ada, MD;  [facility];  Service: Endoscopy;  Laterality: N/A;  . EYE SURGERY     stye removed  . HARDWARE REMOVAL Left 02/24/2020   Procedure: Hardware Removal, left knee;  Surgeon: [name], MD;  Location: [facility];  Service: Orthopedics;  Laterality: Left;  . HERNIA REPAIR  1975  . KNEE SURGERY Left   . LUNG REMOVAL, PARTIAL  208   RML  . NM MYOVIEW LTD  06/2011   Normal EF. No ischemia or infarction.  [name]  08/07/2018   Procedure: [name];  Surgeon: Carol Ada, MD;  [facility];  Service: Endoscopy;;  balloon sweep  . STERIOD INJECTION Right 02/24/2020   Procedure: RIGHT KNEE STEROID INTRA-ARTICULAR MARCAINE/DEPO-MEDROL INJECTION UNDER ANESTHESIA;  Surgeon: [name], MD;  Location: [facility];  Service: Orthopedics;  Laterality: Right;  . TOTAL HIP ARTHROPLASTY Left 04/05/2017   Procedure: LEFT TOTAL HIP ARTHROPLASTY ANTERIOR APPROACH;  Surgeon: [name], MD;  Location: [facility];  Service: Orthopedics;  Laterality: Left;  . TOTAL HIP ARTHROPLASTY Right 12/19/2018  . TOTAL HIP ARTHROPLASTY Right 12/19/2018   Procedure: RIGHT TOTAL HIP ARTHROPLASTY-DIRECT ANTERIOR APPROACH;  Surgeon: [name], MD;  Location: [facility];  Service: Orthopedics;  Laterality: Right;  . TOTAL KNEE ARTHROPLASTY Left 02/24/2020   Procedure: LEFT TOTAL KNEE ARTHROPLASTY;  Surgeon: [name],  MD;  Location: [facility];  Service: Orthopedics;  Laterality: Left;  . TRANSTHORACIC ECHOCARDIOGRAM  06/2011   Mild concentric LVH. Normal EF with impaired relaxation. Mildly elevated RV pressures of 30 and 40 mmHg.  Marland Kitchen TRANSTHORACIC ECHOCARDIOGRAM  06/2016   normal LV size and function. EF 60-65% with GRD 2 DD. Mild aortic stenosis. Mild LA dilation. Mild to moderately increased PA pressures (46 mmHg).  . TUBAL LIGATION      There were no vitals filed for this visit.   Subjective Assessment - 05/12/20 1613    Subjective Pt. indicated continued pain during stretching and also after.  Doesn't have pain  medicine.  Pt. stated she is trying but keeps reminding clincian about her age.    Patient is accompained by: Family member    Limitations Walking;Standing;House hold activities    Patient Stated Goals Reduce pain.  Wants to walk for exercise, ride a bike.    Currently in Pain? Yes    Pain Score 2    4-5/10 at worst   Pain Location Knee    Pain Orientation Left    Pain Descriptors / Indicators Aching    Pain Type Surgical pain    Pain Onset More than a month ago    Pain Frequency Intermittent    Aggravating Factors  stretching, achy insidious at times.    Pain Relieving Factors rest    Effect of Pain on Daily Activities Independent ambulation              [facility] PT Assessment - 05/12/20 0001      Assessment   Medical Diagnosis S/P L TKA    Referring Provider (PT) Dr. Lorin Mercy    Onset Date/Surgical Date 02/24/20      Balance Screen   Has the patient fallen in the past 6 months No      AROM   Left Knee Extension -5    Left Knee Flexion 110      PROM   Left Knee Extension -2    Left Knee Flexion 110      Strength   Left Knee Flexion 5/5    Left Knee Extension 5/5                         [facility] Adult PT Treatment/Exercise - 05/12/20 0001      Knee/Hip Exercises: Aerobic   Recumbent Bike 3/4 rev 2 mins, full rev 7 mins      Knee/Hip Exercises: Machines for Strengthening   Cybex Knee Extension SL 5 lbs 3 x 10 Lt LE    Cybex Knee Flexion SL 15 lbs 3 x 10 Lt LE      Knee/Hip Exercises: Seated   Other Seated Knee/Hip Exercises slr 2 x 10 Lt LE                       PT Long Term Goals - 05/12/20 1612      PT LONG TERM GOAL #1   Title Patient will demonstrate/report pain at worst less than or equal to 2/10 to facilitate minimal limitation in daily activity secondary to pain symptoms.    Time 8    Period Weeks    Status On-going    Target Date 06/09/20      PT LONG TERM GOAL #2   Title Patient will demonstrate independent use of home  exercise program to facilitate ability to maintain/progress functional gains from skilled physical therapy services.  Time 8    Period Weeks    Status On-going    Target Date 06/09/20      PT LONG TERM GOAL #3   Title Patient will demonstrate independent ambulation community distances > 300 ft to facilitate community integration at [facility].    Period Weeks    Status On-going    Target Date 06/09/20      PT LONG TERM GOAL #4   Title Patient will demonstrate Lt knee AROM 0-110 degrees to facilitate ability to perform transfers, sitting, ambulation, stair navigation s restriction due to mobility.    Baseline 5-105    Period Weeks    Status Partially Met    Target Date 06/09/20      PT LONG TERM GOAL #5   Title Patient will demonstrate Bilateral LE MMT measured at 5/5 throughout to facilitate ability to perform usual standing, walking, stairs at PLOF s limitation due to symptoms.    Baseline 4+    Period Weeks    Status Achieved    Target Date 06/09/20                 Plan - 05/12/20 1554    Clinical Impression Statement Pt. has attended 15 visits overall with continued terminal range restriction noted in Lt LE as well as balance defiicts noted that impair independent ambulation in community at this time (see objective data).  Overall progress at this time possiblity impacted by periods of inconsistent HEP and adherence to techniques with evidence noted on fair recall on HEP upon reviews in clinic. Clinical presentation of fair motivation noted throughout care as previously documented. Pt. may beneifit from certification of PT services for next 4 weeks with skilled PT intervention to address remaining impairments and to improve knowledge and use of HEP for possible transition to HEP and d/c in that timeline.    Personal Factors and Comorbidities Comorbidity 3+    Comorbidities bilateral THA, most recent 08/2019.  Chronic renal disease, HTN, obesity    Examination-Activity  Limitations Transfers;Locomotion Level;Squat;Stairs;Stand;Sit;Bed Mobility;Bend;Carry    Examination-Participation Restrictions Community Activity    Stability/Clinical Decision Making Evolving/Moderate complexity    Clinical Decision Making Moderate    Rehab Potential --   fair to good   PT Frequency --   1x-2x/week   PT Duration 4 weeks    PT Treatment/Interventions ADLs/Self Care Home Management;Electrical Stimulation;Iontophoresis 59m/ml Dexamethasone;Moist Heat;Traction;Balance training;Therapeutic exercise;Therapeutic activities;Functional mobility training;Stair training;Gait training;Ultrasound;Neuromuscular re-education;Patient/family education;Wheelchair mobility training;Manual techniques;Manual lymph drainage;Vasopneumatic Device;Taping;Dry needling;Passive range of motion;Spinal Manipulations;Joint Manipulations    PT Next Visit Plan Continued treatment over next few weeks with goals of transition to HEP.  Recert today to include next 4 weeks.    PT Home Exercise Plan [id number]    Consulted and Agree with Plan of Care Patient           Patient will benefit from skilled therapeutic intervention in order to improve the following deficits and impairments:  Abnormal gait, Decreased endurance, Hypomobility, Increased edema, Decreased activity tolerance, Decreased strength, Pain, Difficulty walking, Decreased mobility, Decreased balance, Decreased range of motion, Impaired perceived functional ability, Impaired flexibility, Decreased coordination  Visit Diagnosis: Chronic pain of left knee  Muscle weakness (generalized)  Difficulty in walking, not elsewhere classified  Localized edema  Stiffness of left knee, not elsewhere classified     Problem List Patient Active Problem List   Diagnosis Date Noted  . S/P total knee arthroplasty, left 03/09/2020  . History of total hip arthroplasty, right 08/21/2019  .  Intermittent claudication ([place]) 07/08/2019  . Hypertensive  nephropathy 12/15/2018  . Chronic renal disease, stage II 12/15/2018  . H/O total hip arthroplasty, left 11/07/2017  . Presence of left artificial hip joint 05/14/2017  . Preop cardiovascular exam 03/12/2017  . Cough   . Abdominal pain   . Symptomatic cholelithiasis 07/01/2016  . Nonspecific ST-T wave electrocardiographic changes   . Lower leg edema 02/16/2015  . Obesity (BMI 30.0-34.9) 02/16/2015  . Essential hypertension 01/11/2012  . Hyperthyroidism 01/11/2012  . Hypercholesteremia 01/11/2012  . H/O vaginal hysterectomy 1974 01/11/2012    Class: History of  . S/P removal of lung  partial R lobe  2010 01/11/2012  . History of hernia repair  R ing. 1975 01/11/2012  . BACK PAIN 11/12/2007  . DYSPNEA 11/11/2007   Scot Jun, PT, DPT, OCS, ATC 05/12/20  4:39 PM    [facility] [address] [place], [place], [address] Phone: [phone]   Fax:  [phone]  Name: [name] MRN: [id number] Date of Birth: 12/30/1932

## 2020-05-17 ENCOUNTER — Telehealth: Payer: Self-pay | Admitting: *Deleted

## 2020-05-17 NOTE — Telephone Encounter (Signed)
Ortho bundle call completed. 

## 2020-05-18 ENCOUNTER — Other Ambulatory Visit: Payer: Self-pay

## 2020-05-18 ENCOUNTER — Ambulatory Visit (INDEPENDENT_AMBULATORY_CARE_PROVIDER_SITE_OTHER): Payer: Medicare Other | Admitting: Physical Therapy

## 2020-05-18 DIAGNOSIS — R262 Difficulty in walking, not elsewhere classified: Secondary | ICD-10-CM | POA: Diagnosis not present

## 2020-05-18 DIAGNOSIS — M25562 Pain in left knee: Secondary | ICD-10-CM | POA: Diagnosis not present

## 2020-05-18 DIAGNOSIS — R600 Localized edema: Secondary | ICD-10-CM

## 2020-05-18 DIAGNOSIS — G8929 Other chronic pain: Secondary | ICD-10-CM

## 2020-05-18 DIAGNOSIS — M6281 Muscle weakness (generalized): Secondary | ICD-10-CM | POA: Diagnosis not present

## 2020-05-18 DIAGNOSIS — M25662 Stiffness of left knee, not elsewhere classified: Secondary | ICD-10-CM | POA: Diagnosis not present

## 2020-05-18 NOTE — Therapy (Signed)
[place], [place], [address] Phone: [phone]   Fax:  [phone]  Physical Therapy Treatment  Patient Details  Name: [name] MRN: [id number] Date of Birth: 30-Sep-1933 Referring Provider (PT): Dr. Lorin Mercy   Encounter Date: 05/18/2020   PT End of Session - 05/18/20 1225    Visit Number 16    Number of Visits 18    Date for PT Re-Evaluation 06/09/20    Authorization Time Period 05/12/2020 - 06/09/2020 POC    Authorization - Visit Number 16    Progress Note Due on Visit 25    PT Start Time 3143    PT Stop Time 1224    PT Time Calculation (min) 39 min    Activity Tolerance Patient tolerated treatment well    Behavior During Therapy [facility] for tasks assessed/performed           Past Medical History:  Diagnosis Date  . Aortic stenosis    Echo 07/05/16: Very mild AS, Mean gradient 10 mm Hg, Peak gradient (S) 19 mmHg   . Arthritis    left hip and back  . Bronchiectasis    isolated to RML; status post right middle lobe partial resection.  . Complication of anesthesia    hard to wake up  . Dyspnea   . Gall stones   . GERD (gastroesophageal reflux disease)    history   . H/O osteoporosis   . Hypertension   . Hyperthyroidism    endocrinologist - Dr. Chalmers Cater  . Pneumonia   . Yeast infection     Past Surgical History:  Procedure Laterality Date  . ABDOMINAL HYSTERECTOMY  1974  . CHOLECYSTECTOMY N/A 07/02/2016   Procedure: LAPAROSCOPIC CHOLECYSTECTOMY;  Surgeon: Stark Klein, MD;  Location: [place];  Service: General;  Laterality: N/A;  . COLONOSCOPY    . ENDOSCOPIC RETROGRADE CHOLANGIOPANCREATOGRAPHY (ERCP) WITH PROPOFOL N/A 08/07/2018   Procedure: ENDOSCOPIC RETROGRADE CHOLANGIOPANCREATOGRAPHY (ERCP) WITH PROPOFOL;  Surgeon: Carol Ada, MD;  [facility];  Service: Endoscopy;  Laterality: N/A;  . EYE SURGERY     stye removed  . HARDWARE REMOVAL Left 02/24/2020   Procedure: Hardware Removal, left knee;   Surgeon: [name], MD;  Location: [place];  Service: Orthopedics;  Laterality: Left;  . HERNIA REPAIR  1975  . KNEE SURGERY Left   . LUNG REMOVAL, PARTIAL  208   RML  . NM MYOVIEW LTD  06/2011   Normal EF. No ischemia or infarction.  [name]  08/07/2018   Procedure: [name];  Surgeon: Carol Ada, MD;  [facility];  Service: Endoscopy;;  balloon sweep  . STERIOD INJECTION Right 02/24/2020   Procedure: RIGHT KNEE STEROID INTRA-ARTICULAR MARCAINE/DEPO-MEDROL INJECTION UNDER ANESTHESIA;  Surgeon: [name], MD;  Location: [place];  Service: Orthopedics;  Laterality: Right;  . TOTAL HIP ARTHROPLASTY Left 04/05/2017   Procedure: LEFT TOTAL HIP ARTHROPLASTY ANTERIOR APPROACH;  Surgeon: [name], MD;  Location: [place];  Service: Orthopedics;  Laterality: Left;  . TOTAL HIP ARTHROPLASTY Right 12/19/2018  . TOTAL HIP ARTHROPLASTY Right 12/19/2018   Procedure: RIGHT TOTAL HIP ARTHROPLASTY-DIRECT ANTERIOR APPROACH;  Surgeon: [name], MD;  Location: [place];  Service: Orthopedics;  Laterality: Right;  . TOTAL KNEE ARTHROPLASTY Left 02/24/2020   Procedure: LEFT TOTAL KNEE ARTHROPLASTY;  Surgeon: [name], MD;  Location: [place];  Service: Orthopedics;  Laterality: Left;  . TRANSTHORACIC ECHOCARDIOGRAM  06/2011   Mild concentric LVH. Normal EF with impaired  relaxation. Mildly elevated RV pressures of 30 and 40 mmHg.  Marland Kitchen TRANSTHORACIC ECHOCARDIOGRAM  06/2016   normal LV size and function. EF 60-65% with GRD 2 DD. Mild aortic stenosis. Mild LA dilation. Mild to moderately increased PA pressures (46 mmHg).  . TUBAL LIGATION      There were no vitals filed for this visit.   Subjective Assessment - 05/18/20 1220    Subjective relays her knee is in a lot of pain today but wants to keep working on getting it straight and getting it stronger.    Patient is accompained by: Family member    Limitations Walking;Standing;House hold activities    Patient Stated Goals  Reduce pain.  Wants to walk for exercise, ride a bike.    Pain Onset More than a month ago             [facility] Adult PT Treatment/Exercise - 05/18/20 0001      Knee/Hip Exercises: Stretches   Active Hamstring Stretch Left;3 reps;30 seconds    Gastroc Stretch 3 reps;30 seconds    Gastroc Stretch Limitations slantboard    Other Knee/Hip Stretches supine TKE stretching with heel propped and quad sets 5 sec 2X15 reps      Knee/Hip Exercises: Aerobic   Recumbent Bike 3/4 rev 2 mins, full rev 5 mins      Knee/Hip Exercises: Machines for Strengthening   Cybex Knee Extension SL 5 lbs 3 x 10 Lt LE    Cybex Knee Flexion SL 15 lbs 3 x 10 Lt LE    Total Gym Leg Press 68 lbs bilat push 3X10                       PT Long Term Goals - 05/12/20 1612      PT LONG TERM GOAL #1   Title Patient will demonstrate/report pain at worst less than or equal to 2/10 to facilitate minimal limitation in daily activity secondary to pain symptoms.    Time 8    Period Weeks    Status On-going    Target Date 06/09/20      PT LONG TERM GOAL #2   Title Patient will demonstrate independent use of home exercise program to facilitate ability to maintain/progress functional gains from skilled physical therapy services.    Time 8    Period Weeks    Status On-going    Target Date 06/09/20      PT LONG TERM GOAL #3   Title Patient will demonstrate independent ambulation community distances > 300 ft to facilitate community integration at [facility].    Period Weeks    Status On-going    Target Date 06/09/20      PT LONG TERM GOAL #4   Title Patient will demonstrate Lt knee AROM 0-110 degrees to facilitate ability to perform transfers, sitting, ambulation, stair navigation s restriction due to mobility.    Baseline 5-105    Period Weeks    Status Partially Met    Target Date 06/09/20      PT LONG TERM GOAL #5   Title Patient will demonstrate Bilateral LE MMT measured at 5/5 throughout to facilitate  ability to perform usual standing, walking, stairs at PLOF s limitation due to symptoms.    Baseline 4+    Period Weeks    Status Achieved    Target Date 06/09/20                 Plan - 05/18/20  1225    Clinical Impression Statement Session focused on improving overall knee stength and extension ROM. Extension ROM does appear to be better than when I last saw her. PT will continue to progress this as able.    Personal Factors and Comorbidities Comorbidity 3+    Comorbidities bilateral THA, most recent 08/2019.  Chronic renal disease, HTN, obesity    Examination-Activity Limitations Transfers;Locomotion Level;Squat;Stairs;Stand;Sit;Bed Mobility;Bend;Carry    Examination-Participation Restrictions Community Activity    Stability/Clinical Decision Making Evolving/Moderate complexity    Rehab Potential --   fair to good   PT Frequency --   1x-2x/week   PT Duration 4 weeks    PT Treatment/Interventions ADLs/Self Care Home Management;Electrical Stimulation;Iontophoresis 69m/ml Dexamethasone;Moist Heat;Traction;Balance training;Therapeutic exercise;Therapeutic activities;Functional mobility training;Stair training;Gait training;Ultrasound;Neuromuscular re-education;Patient/family education;Wheelchair mobility training;Manual techniques;Manual lymph drainage;Vasopneumatic Device;Taping;Dry needling;Passive range of motion;Spinal Manipulations;Joint Manipulations    PT Next Visit Plan Continued treatment over next few weeks with goals of transition to HEP.  Recert today to include next 4 weeks.    PT Home Exercise Plan [email or web address]    Consulted and Agree with Plan of Care Patient           Patient will benefit from skilled therapeutic intervention in order to improve the following deficits and impairments:  Abnormal gait, Decreased endurance, Hypomobility, Increased edema, Decreased activity tolerance, Decreased strength, Pain, Difficulty walking, Decreased mobility, Decreased balance, Decreased  range of motion, Impaired perceived functional ability, Impaired flexibility, Decreased coordination  Visit Diagnosis: Chronic pain of left knee  Muscle weakness (generalized)  Difficulty in walking, not elsewhere classified  Localized edema  Stiffness of left knee, not elsewhere classified     Problem List Patient Active Problem List   Diagnosis Date Noted  . S/P total knee arthroplasty, left 03/09/2020  . History of total hip arthroplasty, right 08/21/2019  . [place] 07/08/2019  . Hypertensive nephropathy 12/15/2018  . Chronic renal disease, stage II 12/15/2018  . H/O total hip arthroplasty, left 11/07/2017  . Presence of left artificial hip joint 05/14/2017  . Preop cardiovascular exam 03/12/2017  . Cough   . Abdominal pain   . Symptomatic cholelithiasis 07/01/2016  . Nonspecific ST-T wave electrocardiographic changes   . Lower leg edema 02/16/2015  . Obesity (BMI 30.0-34.9) 02/16/2015  . Essential hypertension 01/11/2012  . Hyperthyroidism 01/11/2012  . Hypercholesteremia 01/11/2012  . H/O vaginal hysterectomy 1974 01/11/2012    Class: History of  . S/P removal of lung  partial R lobe  2010 01/11/2012  . History of hernia repair  R ing. 1975 01/11/2012  . BACK PAIN 11/12/2007  . DYSPNEA 11/11/2007    BSilvestre Mesi8/08/2020, 12:26 PM  [facility] [address] [place] [address]Phone: [phone]  Fax:  [phone] Name: MFINA HEIZERMRN: [id number] of Birth: 805-18-1934

## 2020-05-24 ENCOUNTER — Ambulatory Visit (INDEPENDENT_AMBULATORY_CARE_PROVIDER_SITE_OTHER): Payer: Medicare Other | Admitting: Physical Therapy

## 2020-05-24 ENCOUNTER — Other Ambulatory Visit: Payer: Self-pay

## 2020-05-24 DIAGNOSIS — M25562 Pain in left knee: Secondary | ICD-10-CM | POA: Diagnosis not present

## 2020-05-24 DIAGNOSIS — M25662 Stiffness of left knee, not elsewhere classified: Secondary | ICD-10-CM | POA: Diagnosis not present

## 2020-05-24 DIAGNOSIS — R262 Difficulty in walking, not elsewhere classified: Secondary | ICD-10-CM | POA: Diagnosis not present

## 2020-05-24 DIAGNOSIS — G8929 Other chronic pain: Secondary | ICD-10-CM

## 2020-05-24 DIAGNOSIS — R600 Localized edema: Secondary | ICD-10-CM

## 2020-05-24 DIAGNOSIS — M6281 Muscle weakness (generalized): Secondary | ICD-10-CM | POA: Diagnosis not present

## 2020-05-24 NOTE — Therapy (Signed)
[place], [place], [address] Phone: [phone]   Fax:  [phone]  Physical Therapy Treatment  Patient Details  Name: [name] MRN: [id number] Date of Birth: Jun 30, 1933 Referring Provider (PT): Dr. Lorin Mercy   Encounter Date: 05/24/2020   PT End of Session - 05/24/20 1622    Visit Number 17    Number of Visits 18    Date for PT Re-Evaluation 06/09/20    Authorization Time Period 05/12/2020 - 06/09/2020 POC    Authorization - Visit Number 17    Progress Note Due on Visit 25    PT Start Time 1602    PT Stop Time 1640    PT Time Calculation (min) 38 min    Activity Tolerance Patient tolerated treatment well    Behavior During Therapy [facility] for tasks assessed/performed           Past Medical History:  Diagnosis Date  . Aortic stenosis    Echo 07/05/16: Very mild AS, Mean gradient 10 mm Hg, Peak gradient (S) 19 mmHg   . Arthritis    left hip and back  . Bronchiectasis    isolated to RML; status post right middle lobe partial resection.  . Complication of anesthesia    hard to wake up  . Dyspnea   . Gall stones   . GERD (gastroesophageal reflux disease)    history   . H/O osteoporosis   . Hypertension   . Hyperthyroidism    endocrinologist - Dr. Chalmers Cater  . Pneumonia   . Yeast infection     Past Surgical History:  Procedure Laterality Date  . ABDOMINAL HYSTERECTOMY  1974  . CHOLECYSTECTOMY N/A 07/02/2016   Procedure: LAPAROSCOPIC CHOLECYSTECTOMY;  Surgeon: Stark Klein, MD;  Location: [place];  Service: General;  Laterality: N/A;  . COLONOSCOPY    . ENDOSCOPIC RETROGRADE CHOLANGIOPANCREATOGRAPHY (ERCP) WITH PROPOFOL N/A 08/07/2018   Procedure: ENDOSCOPIC RETROGRADE CHOLANGIOPANCREATOGRAPHY (ERCP) WITH PROPOFOL;  Surgeon: Carol Ada, MD;  [facility];  Service: Endoscopy;  Laterality: N/A;  . EYE SURGERY     stye removed  . HARDWARE REMOVAL Left 02/24/2020   Procedure: Hardware Removal, left knee;   Surgeon: [name], MD;  Location: [place];  Service: Orthopedics;  Laterality: Left;  . HERNIA REPAIR  1975  . KNEE SURGERY Left   . LUNG REMOVAL, PARTIAL  208   RML  . NM MYOVIEW LTD  06/2011   Normal EF. No ischemia or infarction.  [name]  08/07/2018   Procedure: [name];  Surgeon: Carol Ada, MD;  [facility];  Service: Endoscopy;;  balloon sweep  . STERIOD INJECTION Right 02/24/2020   Procedure: RIGHT KNEE STEROID INTRA-ARTICULAR MARCAINE/DEPO-MEDROL INJECTION UNDER ANESTHESIA;  Surgeon: [name], MD;  Location: [place];  Service: Orthopedics;  Laterality: Right;  . TOTAL HIP ARTHROPLASTY Left 04/05/2017   Procedure: LEFT TOTAL HIP ARTHROPLASTY ANTERIOR APPROACH;  Surgeon: [name], MD;  Location: [place];  Service: Orthopedics;  Laterality: Left;  . TOTAL HIP ARTHROPLASTY Right 12/19/2018  . TOTAL HIP ARTHROPLASTY Right 12/19/2018   Procedure: RIGHT TOTAL HIP ARTHROPLASTY-DIRECT ANTERIOR APPROACH;  Surgeon: [name], MD;  Location: [place];  Service: Orthopedics;  Laterality: Right;  . TOTAL KNEE ARTHROPLASTY Left 02/24/2020   Procedure: LEFT TOTAL KNEE ARTHROPLASTY;  Surgeon: [name], MD;  Location: [place];  Service: Orthopedics;  Laterality: Left;  . TRANSTHORACIC ECHOCARDIOGRAM  06/2011   Mild concentric LVH. Normal EF with impaired  relaxation. Mildly elevated RV pressures of 30 and 40 mmHg.  Marland Kitchen TRANSTHORACIC ECHOCARDIOGRAM  06/2016   normal LV size and function. EF 60-65% with GRD 2 DD. Mild aortic stenosis. Mild LA dilation. Mild to moderately increased PA pressures (46 mmHg).  . TUBAL LIGATION      There were no vitals filed for this visit.   Subjective Assessment - 05/24/20 1617    Subjective relays she is hurting today but feels her knee is getting more straight overall    Patient is accompained by: Family member    Limitations Walking;Standing;House hold activities    Patient Stated Goals Reduce pain.  Wants to walk for  exercise, ride a bike.    Pain Onset More than a month ago              [facility] PT Assessment - 05/24/20 0001      Assessment   Medical Diagnosis S/P L TKA    Referring Provider (PT) Dr. Lorin Mercy    Onset Date/Surgical Date 02/24/20      AROM   Left Knee Extension -2    Left Knee Flexion 110      PROM   Left Knee Extension 0    Left Knee Flexion 115                         [facility] Adult PT Treatment/Exercise - 05/24/20 0001      Knee/Hip Exercises: Stretches   Active Hamstring Stretch Left;3 reps;30 seconds    Gastroc Stretch 3 reps;30 seconds    Gastroc Stretch Limitations slantboard    Other Knee/Hip Stretches --      Knee/Hip Exercises: Aerobic   Recumbent Bike 3/4 rev 2 mins, full rev 5 mins      Knee/Hip Exercises: Machines for Strengthening   Cybex Knee Extension SL 5 lbs 3 x 10 Lt LE    Cybex Knee Flexion SL 15 lbs 3 x 10 Lt LE    Total Gym Leg Press 68 lbs bilat push 3X10, then Rt only 50 lbs 3X10                       PT Long Term Goals - 05/12/20 1612      PT LONG TERM GOAL #1   Title Patient will demonstrate/report pain at worst less than or equal to 2/10 to facilitate minimal limitation in daily activity secondary to pain symptoms.    Time 8    Period Weeks    Status On-going    Target Date 06/09/20      PT LONG TERM GOAL #2   Title Patient will demonstrate independent use of home exercise program to facilitate ability to maintain/progress functional gains from skilled physical therapy services.    Time 8    Period Weeks    Status On-going    Target Date 06/09/20      PT LONG TERM GOAL #3   Title Patient will demonstrate independent ambulation community distances > 300 ft to facilitate community integration at [facility].    Period Weeks    Status On-going    Target Date 06/09/20      PT LONG TERM GOAL #4   Title Patient will demonstrate Lt knee AROM 0-110 degrees to facilitate ability to perform transfers, sitting,  ambulation, stair navigation s restriction due to mobility.    Baseline 5-105    Period Weeks    Status Partially Met  Target Date 06/09/20      PT LONG TERM GOAL #5   Title Patient will demonstrate Bilateral LE MMT measured at 5/5 throughout to facilitate ability to perform usual standing, walking, stairs at PLOF s limitation due to symptoms.    Baseline 4+    Period Weeks    Status Achieved    Target Date 06/09/20                 Plan - 05/24/20 1635    Clinical Impression Statement She is doing well with ROM and strength. She has one more PT visit left on POC then will follow up with MD. PT will plan to discharge unless MD wants her to continue.    PT Next Visit Plan DC give T shirt white XL    PT Home Exercise Plan [email or web address]    Consulted and Agree with Plan of Care Patient           Patient will benefit from skilled therapeutic intervention in order to improve the following deficits and impairments:     Visit Diagnosis: Chronic pain of left knee  Muscle weakness (generalized)  Difficulty in walking, not elsewhere classified  Localized edema  Stiffness of left knee, not elsewhere classified     Problem List Patient Active Problem List   Diagnosis Date Noted  . S/P total knee arthroplasty, left 03/09/2020  . History of total hip arthroplasty, right 08/21/2019  . Intermittent claudication ([place]) 07/08/2019  . Hypertensive nephropathy 12/15/2018  . Chronic renal disease, stage II 12/15/2018  . H/O total hip arthroplasty, left 11/07/2017  . Presence of left artificial hip joint 05/14/2017  . Preop cardiovascular exam 03/12/2017  . Cough   . Abdominal pain   . Symptomatic cholelithiasis 07/01/2016  . Nonspecific ST-T wave electrocardiographic changes   . Lower leg edema 02/16/2015  . Obesity (BMI 30.0-34.9) 02/16/2015  . Essential hypertension 01/11/2012  . Hyperthyroidism 01/11/2012  . Hypercholesteremia 01/11/2012  . H/O vaginal hysterectomy 1974  01/11/2012    Class: History of  . S/P removal of lung  partial R lobe  2010 01/11/2012  . History of hernia repair  R ing. 1975 01/11/2012  . BACK PAIN 11/12/2007  . DYSPNEA 11/11/2007    Silvestre Mesi 05/24/2020, 4:37 PM  [facility] Physical Therapy [address] [place], [place], [address] Phone: [phone]   Fax:  [phone]  Name: [name] MRN: [id number] Date of Birth: 1932/12/28

## 2020-05-26 ENCOUNTER — Telehealth: Payer: Self-pay | Admitting: *Deleted

## 2020-05-26 NOTE — Telephone Encounter (Signed)
[facility] 90 day call completed. 

## 2020-06-02 ENCOUNTER — Encounter: Payer: Medicare Other | Admitting: Physical Therapy

## 2020-06-02 ENCOUNTER — Telehealth: Payer: Self-pay | Admitting: *Deleted

## 2020-06-02 NOTE — Telephone Encounter (Signed)
8/31 is fine. Homan's test is 50 % accurate so basically a coin flip test which is not helpful really. ASA should be fine . Thanks

## 2020-06-02 NOTE — Telephone Encounter (Signed)
OK, thanks. She also said she has had cramping before and was instructed to eat more bananas/K+, so I've told her to make sure she's getting enough fluids and eat well. Thanks.

## 2020-06-02 NOTE — Telephone Encounter (Signed)
Patient is an [facility] patient that called me today and states she is having cramping in her lower left leg behind the knee and in the calf. She describes it as a cramp that "doesn't go away". She reports it as tender to touch. I asked her to do a Homan's test and she said it is painful behind the knee and into the calf muscle. Her knee surgery was 3 months ago on 02/24/20. She is taking an 81 mg Aspirin 1 x daily. She does see you next week in office on 06/07/20.

## 2020-06-06 ENCOUNTER — Other Ambulatory Visit: Payer: Self-pay

## 2020-06-06 ENCOUNTER — Encounter: Payer: Self-pay | Admitting: Rehabilitative and Restorative Service Providers"

## 2020-06-06 ENCOUNTER — Ambulatory Visit (INDEPENDENT_AMBULATORY_CARE_PROVIDER_SITE_OTHER): Payer: Medicare Other | Admitting: Rehabilitative and Restorative Service Providers"

## 2020-06-06 DIAGNOSIS — M25562 Pain in left knee: Secondary | ICD-10-CM | POA: Diagnosis not present

## 2020-06-06 DIAGNOSIS — R262 Difficulty in walking, not elsewhere classified: Secondary | ICD-10-CM | POA: Diagnosis not present

## 2020-06-06 DIAGNOSIS — M25662 Stiffness of left knee, not elsewhere classified: Secondary | ICD-10-CM | POA: Diagnosis not present

## 2020-06-06 DIAGNOSIS — G8929 Other chronic pain: Secondary | ICD-10-CM

## 2020-06-06 DIAGNOSIS — R600 Localized edema: Secondary | ICD-10-CM | POA: Diagnosis not present

## 2020-06-06 DIAGNOSIS — M6281 Muscle weakness (generalized): Secondary | ICD-10-CM

## 2020-06-06 NOTE — Patient Instructions (Signed)
Access Code: [email or web address] URL: [email or web address] Date: 06/06/2020 Prepared by: Vista Mink  Exercises Small Range Straight Leg Raise - 1 x daily - 7 x weekly - 6 sets - 5 reps - 3 secondsinutes hold

## 2020-06-06 NOTE — Therapy (Signed)
[facility] [place], [place], [address] Phone: [phone]   Fax:  [phone]  Physical Therapy Treatment  Patient Details  Name: [name] MRN: [id number] Date of Birth: 1933-09-09 Referring Provider (PT): Dr. Lorin Mercy   Encounter Date: 06/06/2020   PT End of Session - 06/06/20 1748    Visit Number 18    Number of Visits 24    Date for PT Re-Evaluation 07/07/20    Authorization Time Period 06/06/20 to 07/07/20    Authorization - Visit Number 18    Progress Note Due on Visit 6    PT Start Time 1100    PT Stop Time 1145    PT Time Calculation (min) 45 min    Activity Tolerance Patient tolerated treatment well    Behavior During Therapy [facility] for tasks assessed/performed           Past Medical History:  Diagnosis Date  . Aortic stenosis    Echo 07/05/16: Very mild AS, Mean gradient 10 mm Hg, Peak gradient (S) 19 mmHg   . Arthritis    left hip and back  . Bronchiectasis    isolated to RML; status post right middle lobe partial resection.  . Complication of anesthesia    hard to wake up  . Dyspnea   . Gall stones   . GERD (gastroesophageal reflux disease)    history   . H/O osteoporosis   . Hypertension   . Hyperthyroidism    endocrinologist - Dr. Chalmers Cater  . Pneumonia   . Yeast infection     Past Surgical History:  Procedure Laterality Date  . ABDOMINAL HYSTERECTOMY  1974  . CHOLECYSTECTOMY N/A 07/02/2016   Procedure: LAPAROSCOPIC CHOLECYSTECTOMY;  Surgeon: Stark Klein, MD;  Location: [place];  Service: General;  Laterality: N/A;  . COLONOSCOPY    . ENDOSCOPIC RETROGRADE CHOLANGIOPANCREATOGRAPHY (ERCP) WITH PROPOFOL N/A 08/07/2018   Procedure: ENDOSCOPIC RETROGRADE CHOLANGIOPANCREATOGRAPHY (ERCP) WITH PROPOFOL;  Surgeon: Carol Ada, MD;  [facility];  Service: Endoscopy;  Laterality: N/A;  . EYE SURGERY     stye removed  . HARDWARE REMOVAL Left 02/24/2020   Procedure: Hardware Removal, left knee;   Surgeon: [name], MD;  Location: [place];  Service: Orthopedics;  Laterality: Left;  . HERNIA REPAIR  1975  . KNEE SURGERY Left   . LUNG REMOVAL, PARTIAL  208   RML  . NM MYOVIEW LTD  06/2011   Normal EF. No ischemia or infarction.  [name]  08/07/2018   Procedure: [name];  Surgeon: Carol Ada, MD;  [facility];  Service: Endoscopy;;  balloon sweep  . STERIOD INJECTION Right 02/24/2020   Procedure: RIGHT KNEE STEROID INTRA-ARTICULAR MARCAINE/DEPO-MEDROL INJECTION UNDER ANESTHESIA;  Surgeon: [name], MD;  Location: [place];  Service: Orthopedics;  Laterality: Right;  . TOTAL HIP ARTHROPLASTY Left 04/05/2017   Procedure: LEFT TOTAL HIP ARTHROPLASTY ANTERIOR APPROACH;  Surgeon: [name], MD;  Location: [place];  Service: Orthopedics;  Laterality: Left;  . TOTAL HIP ARTHROPLASTY Right 12/19/2018  . TOTAL HIP ARTHROPLASTY Right 12/19/2018   Procedure: RIGHT TOTAL HIP ARTHROPLASTY-DIRECT ANTERIOR APPROACH;  Surgeon: [name], MD;  Location: [place];  Service: Orthopedics;  Laterality: Right;  . TOTAL KNEE ARTHROPLASTY Left 02/24/2020   Procedure: LEFT TOTAL KNEE ARTHROPLASTY;  Surgeon: [name], MD;  Location: [place];  Service: Orthopedics;  Laterality: Left;  . TRANSTHORACIC ECHOCARDIOGRAM  06/2011   Mild concentric LVH. Normal EF with impaired relaxation.  Mildly elevated RV pressures of 30 and 40 mmHg.  [name] Kitchen TRANSTHORACIC ECHOCARDIOGRAM  06/2016   normal LV size and function. EF 60-65% with GRD 2 DD. Mild aortic stenosis. Mild LA dilation. Mild to moderately increased PA pressures (46 mmHg).  . TUBAL LIGATION      There were no vitals filed for this visit.   Subjective Assessment - 06/06/20 1746    Subjective Reha is expressing apprehension about DC due to her desire to live independently and her need for the cane.    Patient is accompained by: Family member    Limitations Walking;Standing;House hold activities    Patient Stated Goals Reduce  pain.  Wants to walk for exercise, ride a bike.    Currently in Pain? No/denies    Pain Onset More than a month ago                             [facility] Adult PT Treatment/Exercise - 06/06/20 0001      Exercises   Exercises Knee/Hip      Knee/Hip Exercises: Aerobic   Recumbent Bike Seat 4 8 minutes AROM (stretch) and full revolutions       Knee/Hip Exercises: Seated   Long Arc Quad Strengthening;Both;3 sets;5 sets   Seated straight leg raises slow eccentrics   Other Seated Knee/Hip Exercises Tailgate knee flexion AROM 3 minutes    Sit to Sand 5 reps;without UE support   slow eccentrics                 PT Education - 06/06/20 1746    Education Details Updated HEP with easy to do exercises focused on knee flexion AROM and quadriceps strengthening.    Person(s) Educated Patient    Methods Explanation;Demonstration;Verbal cues;Handout    Comprehension Verbalized understanding;Returned demonstration;Need further instruction;Verbal cues required               PT Long Term Goals - 06/06/20 1747      PT LONG TERM GOAL #1   Title Patient will demonstrate/report pain at worst less than or equal to 2/10 to facilitate minimal limitation in daily activity secondary to pain symptoms.    Time 8    Period Weeks    Status Achieved      PT LONG TERM GOAL #2   Title Patient will demonstrate independent use of home exercise program to facilitate ability to maintain/progress functional gains from skilled physical therapy services.    Time 8    Period Weeks    Status On-going      PT LONG TERM GOAL #3   Title Patient will demonstrate independent ambulation community distances > 300 ft to facilitate community integration at [facility].    Baseline Needs cane    Period Weeks    Status On-going      PT LONG TERM GOAL #4   Title Patient will demonstrate Lt knee AROM 0-110 degrees to facilitate ability to perform transfers, sitting, ambulation, stair navigation s  restriction due to mobility.    Baseline Edema limited 06/06/20    Period Weeks    Status Partially Met      PT LONG TERM GOAL #5   Title Patient will demonstrate Bilateral LE MMT measured at 5/5 throughout to facilitate ability to perform usual standing, walking, stairs at PLOF s limitation due to symptoms.    Baseline 4+    Period Weeks    Status Achieved  Plan - 06/06/20 1749    Clinical Impression Statement [name] reports she is apprehensive about stopping PT at this time.  She wants to continue to live independently and would like to be off the cane to do this.  She can live with her children, but reports she really wants to maintain her independence.  Continued strength gains will allow her to be independent off the cane and meet this goal.  We discussed 1X/week in supervised PT through September to help her meet this goal.  Her prognosis is good to do so.    Personal Factors and Comorbidities Comorbidity 3+    Comorbidities bilateral THA, most recent 08/2019.  Chronic renal disease, HTN, obesity    Examination-Activity Limitations Transfers;Locomotion Level;Squat;Stairs;Stand;Sit;Bed Mobility;Bend;Carry    Stability/Clinical Decision Making Evolving/Moderate complexity    Clinical Decision Making Moderate    Rehab Potential Good    PT Frequency 1x / week    PT Duration 4 weeks    PT Treatment/Interventions ADLs/Self Care Home Management;Electrical Stimulation;Iontophoresis 54m/ml Dexamethasone;Moist Heat;Traction;Balance training;Therapeutic exercise;Therapeutic activities;Functional mobility training;Stair training;Gait training;Ultrasound;Neuromuscular re-education;Patient/family education;Wheelchair mobility training;Manual techniques;Manual lymph drainage;Vasopneumatic Device;Taping;Dry needling;Passive range of motion;Spinal Manipulations;Joint Manipulations    PT Next Visit Plan DC give T shirt white XL    PT Home Exercise Plan Access Code: [email or web address]     Consulted and Agree with Plan of Care Patient           Patient will benefit from skilled therapeutic intervention in order to improve the following deficits and impairments:  Abnormal gait, Decreased endurance, Hypomobility, Increased edema, Decreased activity tolerance, Decreased strength, Pain, Difficulty walking, Decreased mobility, Decreased balance, Decreased range of motion, Impaired perceived functional ability, Impaired flexibility, Decreased coordination  Visit Diagnosis: Chronic pain of left knee  Muscle weakness (generalized)  Difficulty in walking, not elsewhere classified  Localized edema  Stiffness of left knee, not elsewhere classified     Problem List Patient Active Problem List   Diagnosis Date Noted  . S/P total knee arthroplasty, left 03/09/2020  . History of total hip arthroplasty, right 08/21/2019  . Intermittent claudication ([place] 07/08/2019  . Hypertensive nephropathy 12/15/2018  . Chronic renal disease, stage II 12/15/2018  . H/O total hip arthroplasty, left 11/07/2017  . Presence of left artificial hip joint 05/14/2017  . Preop cardiovascular exam 03/12/2017  . Cough   . Abdominal pain   . Symptomatic cholelithiasis 07/01/2016  . Nonspecific ST-T wave electrocardiographic changes   . Lower leg edema 02/16/2015  . Obesity (BMI 30.0-34.9) 02/16/2015  . Essential hypertension 01/11/2012  . Hyperthyroidism 01/11/2012  . Hypercholesteremia 01/11/2012  . H/O vaginal hysterectomy 1974 01/11/2012    Class: History of  . S/P removal of lung  partial R lobe  2010 01/11/2012  . History of hernia repair  R ing. 1975 01/11/2012  . BACK PAIN 11/12/2007  . DYSPNEA 11/11/2007    RFarley LyPT, MPT 06/06/2020, 5:53 PM  [facility] [address] [address]Phone: [phone]  Fax:  [phone] Name: MMIREL HUNDALMRN: [id number] of Birth: 801/18/1934

## 2020-06-07 ENCOUNTER — Encounter: Payer: Self-pay | Admitting: Orthopaedic Surgery

## 2020-06-07 ENCOUNTER — Ambulatory Visit (INDEPENDENT_AMBULATORY_CARE_PROVIDER_SITE_OTHER): Payer: Medicare Other

## 2020-06-07 ENCOUNTER — Ambulatory Visit (INDEPENDENT_AMBULATORY_CARE_PROVIDER_SITE_OTHER): Payer: Medicare Other | Admitting: Orthopaedic Surgery

## 2020-06-07 VITALS — Ht 60.0 in | Wt 197.0 lb

## 2020-06-07 DIAGNOSIS — Z96652 Presence of left artificial knee joint: Secondary | ICD-10-CM

## 2020-06-07 DIAGNOSIS — M7989 Other specified soft tissue disorders: Secondary | ICD-10-CM

## 2020-06-07 DIAGNOSIS — M79605 Pain in left leg: Secondary | ICD-10-CM | POA: Diagnosis not present

## 2020-06-07 NOTE — Progress Notes (Signed)
Office Visit Note   Patient: [name]           Date of Birth: 1933/09/15           MRN: [id number] Visit Date: 06/07/2020              Requested by: Glendale Chard, [address] [place],  [place] [address] PCP: Glendale Chard, MD   Assessment & Plan: Visit Diagnoses:  1. S/P total knee arthroplasty, left   2. Pain and swelling of lower extremity, left     Plan: We will proceed with the venous Doppler rule out popliteal DVT.  Currently she is barely able to lift her leg and we discussed and reviewed x-rays which look good.  She may have overdone it with therapy slightly but has done better and only lacks about 1 degree of reaching full extension.  She still has some quad weakness which she was working on.  I plan to recheck her in a few weeks.  I congratulated her on the work she has done to get her full extension I think when she gets her quad stronger her ambulation will significantly improved.  We will set up a venous Doppler rule out popliteal DVT.  Follow-Up Instructions: Return in about 3 weeks (around 06/28/2020).   Orders:  Orders Placed This Encounter  Procedures  . XR Knee 1-2 Views Left  . VAS [place] LOWER EXTREMITY VENOUS (DVT)   No orders of the defined types were placed in this encounter.     Procedures: No procedures performed   Clinical Data: No additional findings.   Subjective: Chief Complaint  Patient presents with  . Left Knee - Follow-up    02/24/2020 Left TKA    HPI 86 year old female returns she states she has been working hard on getting full extension but has been having cramps in her popliteal region.  She had severe pain last week it took several days.  She states she is barely able to lift her leg currently due to pain and she describes it as a in the last 48 hours.  Type pain.  She has tried coconut water, mustard, potassium, bananas without relief.  She is taking Robaxin has not helped.  She states she has been off pain  medication for 14 days currently.  She was better and then started having increased cramping the last 48 hours.  She states she was walking better least a week ago.  Yesterday she had significant problems with therapy.  No history of DVT.  Review of Systems   Objective: Vital Signs: Ht 5' (1.524 m)   Wt 197 lb (89.4 kg)   BMI 38.47 kg/m   Physical Exam  Ortho Exam  Specialty Comments:  No specialty comments available.  Imaging: XR Knee 1-2 Views Left  Result Date: 06/08/2020 AP lateral left knee x-rays are obtained and reviewed.  This is negative for loosening or subsidence and unchanged from May 2021 postop x-rays. Impression: Satisfactory left total knee arthroplasty.    PMFS History: Patient Active Problem List   Diagnosis Date Noted  . S/P total knee arthroplasty, left 03/09/2020  . History of total hip arthroplasty, right 08/21/2019  . Intermittent claudication ([place]) 07/08/2019  . Hypertensive nephropathy 12/15/2018  . Chronic renal disease, stage II 12/15/2018  . H/O total hip arthroplasty, left 11/07/2017  . Presence of left artificial hip joint 05/14/2017  . Preop cardiovascular exam 03/12/2017  . Cough   . Abdominal pain   .  Symptomatic cholelithiasis 07/01/2016  . Nonspecific ST-T wave electrocardiographic changes   . Lower leg edema 02/16/2015  . Obesity (BMI 30.0-34.9) 02/16/2015  . Essential hypertension 01/11/2012  . Hyperthyroidism 01/11/2012  . Hypercholesteremia 01/11/2012  . H/O vaginal hysterectomy 1974 01/11/2012    Class: History of  . S/P removal of lung  partial R lobe  2010 01/11/2012  . History of hernia repair  R ing. 1975 01/11/2012  . BACK PAIN 11/12/2007  . DYSPNEA 11/11/2007   Past Medical History:  Diagnosis Date  . Aortic stenosis    Echo 07/05/16: Very mild AS, Mean gradient 10 mm Hg, Peak gradient (S) 19 mmHg   . Arthritis    left hip and back  . Bronchiectasis    isolated to RML; status post right middle lobe partial  resection.  . Complication of anesthesia    hard to wake up  . Dyspnea   . Gall stones   . GERD (gastroesophageal reflux disease)    history   . H/O osteoporosis   . Hypertension   . Hyperthyroidism    endocrinologist - Dr. Chalmers Cater  . Pneumonia   . Yeast infection     Family History  Problem Relation Age of Onset  . Hypertension Mother   . CVA Mother 15  . Heart attack Sister   . Diabetes Sister   . Healthy Father   . Heart attack Brother   . Heart attack Brother     Past Surgical History:  Procedure Laterality Date  . ABDOMINAL HYSTERECTOMY  1974  . CHOLECYSTECTOMY N/A 07/02/2016   Procedure: LAPAROSCOPIC CHOLECYSTECTOMY;  Surgeon: Stark Klein, MD;  Location: [place];  Service: General;  Laterality: N/A;  . COLONOSCOPY    . ENDOSCOPIC RETROGRADE CHOLANGIOPANCREATOGRAPHY (ERCP) WITH PROPOFOL N/A 08/07/2018   Procedure: ENDOSCOPIC RETROGRADE CHOLANGIOPANCREATOGRAPHY (ERCP) WITH PROPOFOL;  Surgeon: Carol Ada, MD;  [facility];  Service: Endoscopy;  Laterality: N/A;  . EYE SURGERY     stye removed  . HARDWARE REMOVAL Left 02/24/2020   Procedure: Hardware Removal, left knee;  Surgeon: [name], MD;  Location: [place];  Service: Orthopedics;  Laterality: Left;  . HERNIA REPAIR  1975  . KNEE SURGERY Left   . LUNG REMOVAL, PARTIAL  208   RML  . NM MYOVIEW LTD  06/2011   Normal EF. No ischemia or infarction.  [name]  08/07/2018   Procedure: [name];  Surgeon: Carol Ada, MD;  [facility];  Service: Endoscopy;;  balloon sweep  . STERIOD INJECTION Right 02/24/2020   Procedure: RIGHT KNEE STEROID INTRA-ARTICULAR MARCAINE/DEPO-MEDROL INJECTION UNDER ANESTHESIA;  Surgeon: [name], MD;  Location: [place];  Service: Orthopedics;  Laterality: Right;  . TOTAL HIP ARTHROPLASTY Left 04/05/2017   Procedure: LEFT TOTAL HIP ARTHROPLASTY ANTERIOR APPROACH;  Surgeon: [name], MD;  Location: [place];  Service: Orthopedics;  Laterality: Left;  .  TOTAL HIP ARTHROPLASTY Right 12/19/2018  . TOTAL HIP ARTHROPLASTY Right 12/19/2018   Procedure: RIGHT TOTAL HIP ARTHROPLASTY-DIRECT ANTERIOR APPROACH;  Surgeon: [name], MD;  Location: [place];  Service: Orthopedics;  Laterality: Right;  . TOTAL KNEE ARTHROPLASTY Left 02/24/2020   Procedure: LEFT TOTAL KNEE ARTHROPLASTY;  Surgeon: [name], MD;  Location: [place];  Service: Orthopedics;  Laterality: Left;  . TRANSTHORACIC ECHOCARDIOGRAM  06/2011   Mild concentric LVH. Normal EF with impaired relaxation. Mildly elevated RV pressures of 30 and 40 mmHg.  Marland Kitchen TRANSTHORACIC ECHOCARDIOGRAM  06/2016   normal LV size  and function. EF 60-65% with GRD 2 DD. Mild aortic stenosis. Mild LA dilation. Mild to moderately increased PA pressures (46 mmHg).  . TUBAL LIGATION     Social History   Occupational History  . Occupation: retired  Tobacco Use  . Smoking status: Never Smoker  . Smokeless tobacco: Never Used  Vaping Use  . Vaping Use: Never used  Substance and Sexual Activity  . Alcohol use: No  . Drug use: No  . Sexual activity: Not Currently

## 2020-06-08 ENCOUNTER — Other Ambulatory Visit: Payer: Self-pay

## 2020-06-08 ENCOUNTER — Ambulatory Visit (HOSPITAL_COMMUNITY)
Admission: RE | Admit: 2020-06-08 | Discharge: 2020-06-08 | Disposition: A | Discharge status: Home / Self Care | Payer: Medicare Other | Source: Ambulatory Visit | Attending: Orthopaedic Surgery | Admitting: Orthopaedic Surgery

## 2020-06-08 ENCOUNTER — Telehealth: Payer: Self-pay | Admitting: Radiology

## 2020-06-08 DIAGNOSIS — Z96652 Presence of left artificial knee joint: Secondary | ICD-10-CM | POA: Insufficient documentation

## 2020-06-08 DIAGNOSIS — M79605 Pain in left leg: Secondary | ICD-10-CM | POA: Insufficient documentation

## 2020-06-08 DIAGNOSIS — M7989 Other specified soft tissue disorders: Secondary | ICD-10-CM | POA: Insufficient documentation

## 2020-06-08 NOTE — Telephone Encounter (Signed)
I left voicemail advising. ?

## 2020-06-08 NOTE — Telephone Encounter (Signed)
Ucall. Let her know no DVT thanks

## 2020-06-08 NOTE — Telephone Encounter (Signed)
Per [facility] she is negative for DVT in the lower extremity.

## 2020-06-08 NOTE — Telephone Encounter (Signed)
  Received call report for doppler--Negative for DVT. Please see results below and advise.    Summary:  RIGHT:  - No evidence of common femoral vein obstruction.    LEFT:  - There is no evidence of deep vein thrombosis in the lower extremity.    - Area of mixed echoes, more solid appearance, with vascularity noted left  popliteal fossa. Etiology unknown.

## 2020-06-08 NOTE — Telephone Encounter (Signed)
Noted. Duplicate message sent to Dr. Lorin Mercy.

## 2020-06-23 ENCOUNTER — Telehealth: Payer: Self-pay | Admitting: *Deleted

## 2020-06-23 ENCOUNTER — Other Ambulatory Visit: Payer: Self-pay

## 2020-06-23 NOTE — Telephone Encounter (Signed)
Patient called me and states she is still having cramping behind the knee mostly and now tingling that goes down the knee to the ankle. No numbness. Has difficulty straightening or walking due to pain/cramping she reports. Taking Tylenol as needed. Finished therapy. I told her to maybe call her PCP to report the cramping as well to make sure lab work may be ok. She says she will. Any other recommendations? I know doppler was negative. Thanks.

## 2020-06-23 NOTE — Telephone Encounter (Signed)
I called discussed.  May be her hip she thinks.  If not better at [facility] will re-eval.    She has appt with PCP to get calcium and electrolytes checked.

## 2020-06-28 ENCOUNTER — Other Ambulatory Visit: Payer: Self-pay

## 2020-06-28 ENCOUNTER — Encounter: Payer: Self-pay | Admitting: Nurse Practitioner

## 2020-06-28 ENCOUNTER — Ambulatory Visit (INDEPENDENT_AMBULATORY_CARE_PROVIDER_SITE_OTHER): Payer: Medicare Other | Admitting: Nurse Practitioner

## 2020-06-28 VITALS — BP 118/72 | HR 61 | Temp 97.5°F | Ht 60.8 in | Wt 187.4 lb

## 2020-06-28 DIAGNOSIS — N182 Chronic kidney disease, stage 2 (mild): Secondary | ICD-10-CM

## 2020-06-28 DIAGNOSIS — I10 Essential (primary) hypertension: Secondary | ICD-10-CM | POA: Diagnosis not present

## 2020-06-28 DIAGNOSIS — I700 Atherosclerosis of aorta: Secondary | ICD-10-CM

## 2020-06-28 DIAGNOSIS — I129 Hypertensive chronic kidney disease with stage 1 through stage 4 chronic kidney disease, or unspecified chronic kidney disease: Secondary | ICD-10-CM

## 2020-06-28 DIAGNOSIS — E7800 Pure hypercholesterolemia, unspecified: Secondary | ICD-10-CM

## 2020-06-28 DIAGNOSIS — E0590 Thyrotoxicosis, unspecified without thyrotoxic crisis or storm: Secondary | ICD-10-CM | POA: Diagnosis not present

## 2020-06-28 NOTE — Progress Notes (Addendum)
I,Yamilka Roman [employer] as a Education administrator for [employer], FNP.,have documented all relevant documentation on the behalf of Minette Brine, FNP,as directed by  Minette Brine, FNP while in the presence of Minette Brine, [place].  This visit occurred during the SARS-CoV-2 public health emergency.  Safety protocols were in place, including screening questions prior to the visit, additional usage of staff PPE, and extensive cleaning of exam room while observing appropriate contact time as indicated for disinfecting solutions.  Subjective:     Patient ID: [name] , female    DOB: 05/15/1933 , 86 y.o.   MRN: [id number]   Chief Complaint  Patient presents with  . muscle cramps    patient stated she had knee surgery. she stated she is getting cramps in her calf muscles after working out    HPI  She is here today due to having cramping to her left knee after exercising. She had knee surgery May 19th.  She has a drawing of her muscles.  She feels it is worse at night.  She has stopped doing the exercise she was doing.  She drinks coconut water.  Drinking at least 4 bottles of water a day.    Past Medical History:  Diagnosis Date  . Aortic stenosis    Echo 07/05/16: Very mild AS, Mean gradient 10 mm Hg, Peak gradient (S) 19 mmHg   . Arthritis    left hip and back  . Bronchiectasis    isolated to RML; status post right middle lobe partial resection.  . Complication of anesthesia    hard to wake up  . Dyspnea   . Gall stones   . GERD (gastroesophageal reflux disease)    history   . H/O osteoporosis   . Hypertension   . Hyperthyroidism    endocrinologist - Dr. Chalmers Cater  . Pneumonia   . Yeast infection      Family History  Problem Relation Age of Onset  . Hypertension Mother   . CVA Mother 64  . Heart attack Sister   . Diabetes Sister   . Healthy Father   . Heart attack Brother   . Heart attack Brother      Current Outpatient Medications:  .  Ascorbic Acid 500 MG CHEW, Chew 500 mg  by mouth daily. , Disp: , Rfl:  .  busPIRone (BUSPAR) 5 MG tablet, Take 1 tablet (5 mg total) by mouth 2 (two) times daily. (Patient taking differently: Take 5 mg by mouth daily as needed (anxiety). ), Disp: 60 tablet, Rfl: 1 .  calcium carbonate (CALCIUM 600) 600 MG TABS tablet, Take 600 mg by mouth daily., Disp: , Rfl:  .  cholecalciferol (VITAMIN D3) 25 MCG (1000 UT) tablet, Take 1,000 Units by mouth daily., Disp: , Rfl:  .  diclofenac Sodium (VOLTAREN) 1 % GEL, APPLY 2 GRAMS TOPICALLY FOUR TIMES A DAY, Disp: 100 g, Rfl: 27 .  Flaxseed, Linseed, (FLAXSEED OIL) 1000 MG CAPS, Take 1,000 mg by mouth daily., Disp: , Rfl:  .  furosemide (LASIX) 20 MG tablet, TAKE 1 TABLET DAILY, Disp: 90 tablet, Rfl: 3 .  methimazole (TAPAZOLE) 5 MG tablet, Take 2.5 mg by mouth daily. , Disp: , Rfl:  .  methocarbamol (ROBAXIN) 500 MG tablet, Take 1 tablet (500 mg total) by mouth every 6 (six) hours as needed for muscle spasms., Disp: 40 tablet, Rfl: 0 .  metoprolol succinate (TOPROL XL) 25 MG 24 hr tablet, Take 1 tablet (25 mg total) by mouth daily., Disp:  90 tablet, Rfl: 3 .  nystatin cream (MYCOSTATIN), APPLY CREAM TO AFFECTED AREA TWICE A DAY AS NEEDED (Patient taking differently: Apply 1 application topically 2 (two) times daily as needed for dry skin. ), Disp: 30 g, Rfl: 23 .  potassium chloride SA (KLOR-CON) 20 MEQ tablet, Take 1 tablet (20 mEq total) by mouth daily., Disp: 90 tablet, Rfl: 1 .  sulfamethoxazole-trimethoprim (BACTRIM DS) 800-160 MG tablet, 1 po bid x 5 days, Disp: 10 tablet, Rfl: 0 .  TRICOR 145 MG tablet, Take 1 tablet (145 mg total) by mouth daily., Disp: 90 tablet, Rfl: 1   Allergies  Allergen Reactions  . Ibuprofen Other (See Comments)    hallucinations  . Naproxen Sodium Other (See Comments)    hallucinations     Review of Systems  Constitutional: Negative.   Respiratory: Negative.   Cardiovascular: Negative.  Negative for chest pain, palpitations and leg swelling.  Neurological:  Negative for dizziness and headaches.  Psychiatric/Behavioral: Negative.      Today's Vitals   06/28/20 0918  BP: 118/72  Pulse: 61  Temp: (!) 97.5 F (36.4 C)  TempSrc: Oral  Weight: 187 lb 6.4 oz (85 kg)  Height: 5' 0.8" (1.544 m)  PainSc: 2    Body mass index is 35.64 kg/m.   Objective:  Physical Exam Vitals reviewed.  Constitutional:      Appearance: Normal appearance. She is well-developed.  HENT:     Head: Normocephalic and atraumatic.  Eyes:     Pupils: Pupils are equal, round, and reactive to light.  Cardiovascular:     Rate and Rhythm: Normal rate and regular rhythm.     Pulses: Normal pulses.     Heart sounds: Normal heart sounds. No murmur heard.   Pulmonary:     Effort: Pulmonary effort is normal.     Breath sounds: Normal breath sounds.  Musculoskeletal:        General: No tenderness. Normal range of motion.     Comments: Left knee healing surgical scar  Skin:    General: Skin is warm and dry.     Capillary Refill: Capillary refill takes less than 2 seconds.  Neurological:     General: No focal deficit present.     Mental Status: She is alert and oriented to person, place, and time.     Cranial Nerves: No cranial nerve deficit.  Psychiatric:        Mood and Affect: Mood normal.        Behavior: Behavior normal.        Thought Content: Thought content normal.        Judgment: Judgment normal.         Assessment And Plan:     1. Essential hypertension . B/P is well controlled.  Marland Kitchen BMP ordered to check renal function.  . The importance of regular exercise and dietary modification was stressed to the patient as tolerated - BMP8+eGFR - Magnesium - Lipid panel  2. Hyperthyroidism  Chronic, controlled  Continue with current medications  3. Hypercholesteremia  Chronic, controlled  Diet controlled  4. Hypertensive nephropathy  stable  5. Chronic renal disease, stage II  Will check GFR  6. Atherosclerosis of aorta (HCC)  Chronic,  no current medications   Patient was given opportunity to ask questions. Patient verbalized understanding of the plan and was able to repeat key elements of the plan. All questions were answered to their satisfaction.   Teola Bradley, FNP, have reviewed all documentation  for this visit. The documentation on 06/28/20 for the exam, diagnosis, procedures, and orders are all accurate and complete.   THE PATIENT IS ENCOURAGED TO PRACTICE SOCIAL DISTANCING DUE TO THE COVID-19 PANDEMIC.

## 2020-06-28 NOTE — Patient Instructions (Signed)
Get some Mylanta and rub on your leg that you have the cramps.   You can also drink about 4 oz of tonic water a day

## 2020-06-29 ENCOUNTER — Ambulatory Visit (INDEPENDENT_AMBULATORY_CARE_PROVIDER_SITE_OTHER): Payer: Medicare Other | Admitting: Orthopaedic Surgery

## 2020-06-29 ENCOUNTER — Encounter: Payer: Self-pay | Admitting: Orthopaedic Surgery

## 2020-06-29 VITALS — BP 117/72 | HR 69 | Ht 60.8 in | Wt 187.0 lb

## 2020-06-29 DIAGNOSIS — Z96652 Presence of left artificial knee joint: Secondary | ICD-10-CM

## 2020-06-29 LAB — BMP8+EGFR
BUN/Creatinine Ratio: 22 (ref 12–28)
BUN: 17 mg/dL (ref 8–27)
CO2: 26 mmol/L (ref 20–29)
Calcium: 9.7 mg/dL (ref 8.7–10.3)
Chloride: 101 mmol/L (ref 96–106)
Creatinine, Ser: 0.78 mg/dL (ref 0.57–1.00)
GFR calc Af Amer: 80 mL/min/{1.73_m2} (ref >59)
GFR calc non Af Amer: 70 mL/min/{1.73_m2} (ref >59)
Glucose: 85 mg/dL (ref 65–99)
Potassium: 4.7 mmol/L (ref 3.5–5.2)
Sodium: 138 mmol/L (ref 134–144)

## 2020-06-29 LAB — LIPID PANEL
Chol/HDL Ratio: 3.2 ratio (ref 0.0–4.4)
Cholesterol, Total: 166 mg/dL (ref 100–199)
HDL: 52 mg/dL (ref >39)
LDL Chol Calc (NIH): 100 mg/dL — ABNORMAL HIGH (ref 0–99)
Triglycerides: 74 mg/dL (ref 0–149)
VLDL Cholesterol Cal: 14 mg/dL (ref 5–40)

## 2020-06-29 LAB — MAGNESIUM: Magnesium: 1.9 mg/dL (ref 1.6–2.3)

## 2020-06-29 MED ORDER — HYDROCODONE-ACETAMINOPHEN 5-325 MG PO TABS: 1.0000 | ORAL_TABLET | Freq: Four times a day (QID) | ORAL | 0 refills | Status: DC | PRN

## 2020-06-29 NOTE — Progress Notes (Signed)
Office Visit Note   Patient: [name]           Date of Birth: 04/09/1933           MRN: [id number] Visit Date: 06/29/2020              Requested by: Glendale Chard, [address] [place],  [place] [address] PCP: Glendale Chard, MD   Assessment & Plan: Visit Diagnoses:  1. S/P total knee arthroplasty, left     Plan: Post on knee arthroplasty left.  She had good extension and immediate postop period but gradually lost full extension.  She had an episode with increased pain in her knee increased difficulty walking could barely lift her leg now she is lifting her leg again.  X-rays show satisfactory position.  She is done prone positioning worked on extension and is achieved improvement in this but still lacks 10 degrees reaching full extension.  She needs some more therapy.  Prescription for Norco 30 tablets prescribed.  Recheck 1 month.  Follow-Up Instructions: No follow-ups on file.   Orders:  No orders of the defined types were placed in this encounter.  Meds ordered this encounter  Medications  . HYDROcodone-acetaminophen (NORCO/VICODIN) 5-325 MG tablet    Sig: Take 1 tablet by mouth every 6 (six) hours as needed for moderate pain.    Dispense:  30 tablet    Refill:  0      Procedures: No procedures performed   Clinical Data: No additional findings.   Subjective: Chief Complaint  Patient presents with  . Left Knee - Follow-up, Pain    02/24/2020 left TKA    HPI 86 year old female post total knee arthroplasty left 02/24/2020 she is doing well with home therapy and then outpatient therapy and then had increased pain difficulty lifting her leg and lost extension.  Flexion is still past 90 but she had a 20 degree extension loss with tightness of the posterior capsule this developed.  Doppler negative for DVT.  She is work hard on extension is gotten 50% improvement still has 10degree flexion contracture.  She states she has been almost in tears with  prone positioning.  She is tried coconut water, mustard etc. to help with the cramping she has in the popliteal region I discussed with her that this is related to stretching the posterior capsule.  Review of Systems reviewed updated unchanged.   Objective: Vital Signs: BP 117/72   Pulse 69   Ht 5' 0.8" (1.544 m)   Wt 187 lb (84.8 kg)   BMI 35.57 kg/m   Physical Exam Constitutional:      Appearance: She is well-developed.  HENT:     Head: Normocephalic.     Right Ear: External ear normal.     Left Ear: External ear normal.  Eyes:     Pupils: Pupils are equal, round, and reactive to light.  Neck:     Thyroid: No thyromegaly.     Trachea: No tracheal deviation.  Cardiovascular:     Rate and Rhythm: Normal rate.  Pulmonary:     Effort: Pulmonary effort is normal.  Abdominal:     Palpations: Abdomen is soft.  Skin:    General: Skin is warm and dry.  Neurological:     Mental Status: She is alert and oriented to person, place, and time.  Psychiatric:        Behavior: Behavior normal.     Ortho Exam Collateral ligaments are stable.  No significant knee effusion.  Range of motion is 10 to 100 degrees.  She still walks with a bent knee gait. Specialty Comments:  No specialty comments available.  Imaging: No results found.   PMFS History: Patient Active Problem List   Diagnosis Date Noted  . S/P total knee arthroplasty, left 03/09/2020  . History of total hip arthroplasty, right 08/21/2019  . Intermittent claudication ([place]) 07/08/2019  . Hypertensive nephropathy 12/15/2018  . Chronic renal disease, stage II 12/15/2018  . H/O total hip arthroplasty, left 11/07/2017  . Presence of left artificial hip joint 05/14/2017  . Preop cardiovascular exam 03/12/2017  . Cough   . Abdominal pain   . Symptomatic cholelithiasis 07/01/2016  . Nonspecific ST-T wave electrocardiographic changes   . Lower leg edema 02/16/2015  . Obesity (BMI 30.0-34.9) 02/16/2015  . Essential  hypertension 01/11/2012  . Hyperthyroidism 01/11/2012  . Hypercholesteremia 01/11/2012  . H/O vaginal hysterectomy 1974 01/11/2012    Class: History of  . S/P removal of lung  partial R lobe  2010 01/11/2012  . History of hernia repair  R ing. 1975 01/11/2012  . BACK PAIN 11/12/2007  . DYSPNEA 11/11/2007   Past Medical History:  Diagnosis Date  . Aortic stenosis    Echo 07/05/16: Very mild AS, Mean gradient 10 mm Hg, Peak gradient (S) 19 mmHg   . Arthritis    left hip and back  . Bronchiectasis    isolated to RML; status post right middle lobe partial resection.  . Complication of anesthesia    hard to wake up  . Dyspnea   . Gall stones   . GERD (gastroesophageal reflux disease)    history   . H/O osteoporosis   . Hypertension   . Hyperthyroidism    endocrinologist - Dr. Chalmers Cater  . Pneumonia   . Yeast infection     Family History  Problem Relation Age of Onset  . Hypertension Mother   . CVA Mother 48  . Heart attack Sister   . Diabetes Sister   . Healthy Father   . Heart attack Brother   . Heart attack Brother     Past Surgical History:  Procedure Laterality Date  . ABDOMINAL HYSTERECTOMY  1974  . CHOLECYSTECTOMY N/A 07/02/2016   Procedure: LAPAROSCOPIC CHOLECYSTECTOMY;  Surgeon: Stark Klein, MD;  Location: [facility];  Service: General;  Laterality: N/A;  . COLONOSCOPY    . ENDOSCOPIC RETROGRADE CHOLANGIOPANCREATOGRAPHY (ERCP) WITH PROPOFOL N/A 08/07/2018   Procedure: ENDOSCOPIC RETROGRADE CHOLANGIOPANCREATOGRAPHY (ERCP) WITH PROPOFOL;  Surgeon: Carol Ada, MD;  Location: [facility];  Service: Endoscopy;  Laterality: N/A;  . EYE SURGERY     stye removed  . HARDWARE REMOVAL Left 02/24/2020   Procedure: Hardware Removal, left knee;  Surgeon: [name], MD;  Location: [facility];  Service: Orthopedics;  Laterality: Left;  . HERNIA REPAIR  1975  . KNEE SURGERY Left   . LUNG REMOVAL, PARTIAL  208   RML  . NM MYOVIEW LTD  06/2011   Normal EF. No ischemia or infarction.   [name]  08/07/2018   Procedure: [name];  Surgeon: Carol Ada, MD;  Location: [facility];  Service: Endoscopy;;  balloon sweep  . STERIOD INJECTION Right 02/24/2020   Procedure: RIGHT KNEE STEROID INTRA-ARTICULAR MARCAINE/DEPO-MEDROL INJECTION UNDER ANESTHESIA;  Surgeon: [name], MD;  Location: [facility];  Service: Orthopedics;  Laterality: Right;  . TOTAL HIP ARTHROPLASTY Left 04/05/2017   Procedure: LEFT TOTAL HIP ARTHROPLASTY ANTERIOR APPROACH;  Surgeon: Rodell Perna  C, MD;  Location: [facility];  Service: Orthopedics;  Laterality: Left;  . TOTAL HIP ARTHROPLASTY Right 12/19/2018  . TOTAL HIP ARTHROPLASTY Right 12/19/2018   Procedure: RIGHT TOTAL HIP ARTHROPLASTY-DIRECT ANTERIOR APPROACH;  Surgeon: [name], MD;  Location: [facility];  Service: Orthopedics;  Laterality: Right;  . TOTAL KNEE ARTHROPLASTY Left 02/24/2020   Procedure: LEFT TOTAL KNEE ARTHROPLASTY;  Surgeon: [name], MD;  Location: [facility];  Service: Orthopedics;  Laterality: Left;  . TRANSTHORACIC ECHOCARDIOGRAM  06/2011   Mild concentric LVH. Normal EF with impaired relaxation. Mildly elevated RV pressures of 30 and 40 mmHg.  Marland Kitchen TRANSTHORACIC ECHOCARDIOGRAM  06/2016   normal LV size and function. EF 60-65% with GRD 2 DD. Mild aortic stenosis. Mild LA dilation. Mild to moderately increased PA pressures (46 mmHg).  . TUBAL LIGATION     Social History   Occupational History  . Occupation: retired  Tobacco Use  . Smoking status: Never Smoker  . Smokeless tobacco: Never Used  Vaping Use  . Vaping Use: Never used  Substance and Sexual Activity  . Alcohol use: No  . Drug use: No  . Sexual activity: Not Currently

## 2020-06-29 NOTE — Addendum Note (Signed)
Addended by: Meyer Cory on: 06/29/2020 10:39 AM   Modules accepted: Orders

## 2020-07-05 ENCOUNTER — Ambulatory Visit (INDEPENDENT_AMBULATORY_CARE_PROVIDER_SITE_OTHER): Payer: Medicare Other | Admitting: Physical Therapy

## 2020-07-05 ENCOUNTER — Other Ambulatory Visit: Payer: Self-pay

## 2020-07-05 ENCOUNTER — Encounter: Payer: Self-pay | Admitting: Physical Therapy

## 2020-07-05 DIAGNOSIS — R600 Localized edema: Secondary | ICD-10-CM | POA: Diagnosis not present

## 2020-07-05 DIAGNOSIS — M6281 Muscle weakness (generalized): Secondary | ICD-10-CM

## 2020-07-05 DIAGNOSIS — R262 Difficulty in walking, not elsewhere classified: Secondary | ICD-10-CM

## 2020-07-05 DIAGNOSIS — G8929 Other chronic pain: Secondary | ICD-10-CM

## 2020-07-05 DIAGNOSIS — M25662 Stiffness of left knee, not elsewhere classified: Secondary | ICD-10-CM | POA: Diagnosis not present

## 2020-07-05 DIAGNOSIS — M25562 Pain in left knee: Secondary | ICD-10-CM

## 2020-07-05 NOTE — Patient Instructions (Signed)
Access Code: [email or web address] URL: [email or web address] Date: 07/05/2020 Prepared by: Faustino Congress  Exercises Seated Long Arc Quad - 2 x daily - 7 x weekly - 10 reps - 3 sets - 2 hold Supine Knee Extension Mobilization with Weight - 2-3 x daily - 7 x weekly - 1 reps - 1 sets - up to 5 min 15 mins hold Prone Knee Extension with Ankle Weight - 2-3 x daily - 7 x weekly - 1 sets - 1-2 reps - 3-5 min hold Seated Straight Leg Raise - 2-3 x daily - 7 x weekly - 3 sets - 10 reps Mini Squat with Counter Support - 2-3 x daily - 7 x weekly - 3 sets - 10 reps

## 2020-07-05 NOTE — Therapy (Signed)
[facility], [place], [address] Phone: [phone]   Fax:  [phone]  Physical Therapy Treatment/Recertification  Patient Details  Name: [name] MRN: [id number] Date of Birth: 10/23/1932 Referring Provider (PT): Dr. Lorin Mercy   Encounter Date: 07/05/2020   PT End of Session - 07/05/20 1344    Visit Number 19    Number of Visits 24    Date for PT Re-Evaluation 08/16/20    Authorization - Visit Number 19    Progress Note Due on Visit 25    PT Start Time 1300    PT Stop Time 1340    PT Time Calculation (min) 40 min    Activity Tolerance Patient tolerated treatment well    Behavior During Therapy [facility] for tasks assessed/performed           Past Medical History:  Diagnosis Date  . Aortic stenosis    Echo 07/05/16: Very mild AS, Mean gradient 10 mm Hg, Peak gradient (S) 19 mmHg   . Arthritis    left hip and back  . Bronchiectasis    isolated to RML; status post right middle lobe partial resection.  . Complication of anesthesia    hard to wake up  . Dyspnea   . Gall stones   . GERD (gastroesophageal reflux disease)    history   . H/O osteoporosis   . Hypertension   . Hyperthyroidism    endocrinologist - Dr. Chalmers Cater  . Pneumonia   . Yeast infection     Past Surgical History:  Procedure Laterality Date  . ABDOMINAL HYSTERECTOMY  1974  . CHOLECYSTECTOMY N/A 07/02/2016   Procedure: LAPAROSCOPIC CHOLECYSTECTOMY;  Surgeon: Stark Klein, MD;  Location: [place];  Service: General;  Laterality: N/A;  . COLONOSCOPY    . ENDOSCOPIC RETROGRADE CHOLANGIOPANCREATOGRAPHY (ERCP) WITH PROPOFOL N/A 08/07/2018   Procedure: ENDOSCOPIC RETROGRADE CHOLANGIOPANCREATOGRAPHY (ERCP) WITH PROPOFOL;  Surgeon: Carol Ada, MD;  Location: [facility];  Service: Endoscopy;  Laterality: N/A;  . EYE SURGERY     stye removed  . HARDWARE REMOVAL Left 02/24/2020   Procedure: Hardware Removal, left knee;  Surgeon: [name], MD;   Location: [place] [facility];  Service: Orthopedics;  Laterality: Left;  . HERNIA REPAIR  1975  . KNEE SURGERY Left   . LUNG REMOVAL, PARTIAL  208   RML  . NM MYOVIEW LTD  06/2011   Normal EF. No ischemia or infarction.  [name]  08/07/2018   Procedure: [name];  Surgeon: Carol Ada, MD;  Location: [facility];  Service: Endoscopy;;  balloon sweep  . STERIOD INJECTION Right 02/24/2020   Procedure: RIGHT KNEE STEROID INTRA-ARTICULAR MARCAINE/DEPO-MEDROL INJECTION UNDER ANESTHESIA;  Surgeon: [name], MD;  Location: [place];  Service: Orthopedics;  Laterality: Right;  . TOTAL HIP ARTHROPLASTY Left 04/05/2017   Procedure: LEFT TOTAL HIP ARTHROPLASTY ANTERIOR APPROACH;  Surgeon: [name], MD;  Location: [place];  Service: Orthopedics;  Laterality: Left;  . TOTAL HIP ARTHROPLASTY Right 12/19/2018  . TOTAL HIP ARTHROPLASTY Right 12/19/2018   Procedure: RIGHT TOTAL HIP ARTHROPLASTY-DIRECT ANTERIOR APPROACH;  Surgeon: [name], MD;  Location: [facility];  Service: Orthopedics;  Laterality: Right;  . TOTAL KNEE ARTHROPLASTY Left 02/24/2020   Procedure: LEFT TOTAL KNEE ARTHROPLASTY;  Surgeon: [name], MD;  Location: [place];  Service: Orthopedics;  Laterality: Left;  . TRANSTHORACIC ECHOCARDIOGRAM  06/2011   Mild concentric LVH. Normal EF with impaired relaxation. Mildly elevated RV pressures of 30 and 40 mmHg.  Marland Kitchen  TRANSTHORACIC ECHOCARDIOGRAM  06/2016   normal LV size and function. EF 60-65% with GRD 2 DD. Mild aortic stenosis. Mild LA dilation. Mild to moderately increased PA pressures (46 mmHg).  . TUBAL LIGATION      There were no vitals filed for this visit.   Subjective Assessment - 07/05/20 1303    Subjective "I thought I was done with you all."  MD referred her back to PT due to lacking extension.  Pain is worse with exercises - stretches, but if she doesn't exercise her knee feels better.  Surgery was 02/24/20.    Patient is accompained by: Family member    Limitations  Walking;Standing;House hold activities    Patient Stated Goals Reduce pain.  Wants to walk for exercise, ride a bike.    Currently in Pain? Yes    Pain Score 3     Pain Location Knee    Pain Orientation Left    Pain Descriptors / Indicators Aching    Pain Type Surgical pain    Pain Onset More than a month ago    Pain Frequency Intermittent    Aggravating Factors  stretching    Pain Relieving Factors rest              [facility] PT Assessment - 07/05/20 1306      Assessment   Medical Diagnosis S/P L TKA    Referring Provider (PT) Dr. Lorin Mercy    Onset Date/Surgical Date 02/24/20    Next MD Visit 07/29/20      AROM   Left Knee Extension -5   to 0 after passive knee extension   Left Knee Flexion 103      Strength   Left Knee Extension 3+/5   poor isolated quad activation     Ambulation/Gait   Gait Pattern Trendelenburg;Decreased stance time - left;Decreased step length - right                         [facility] Adult PT Treatment/Exercise - 07/05/20 1341      Knee/Hip Exercises: Standing   Functional Squat 1 set;10 reps;3 seconds      Knee/Hip Exercises: Seated   Long Arc Quad Strengthening;3 sets;15 reps    Long [employer] Limitations cues for control    Other Seated Knee/Hip Exercises seated SLR x 10 reps      Knee/Hip Exercises: Supine   Heel Prop for Knee Extension 3 minutes      Knee/Hip Exercises: Prone   Prone Knee Hang Limitations discussed as pt currently doing at home                  PT Education - 07/05/20 1344    Education Details HEP    Person(s) Educated Patient    Methods Explanation;Demonstration;Handout    Comprehension Verbalized understanding;Returned demonstration;Need further instruction               PT Long Term Goals - 07/05/20 1349      PT LONG TERM GOAL #1   Title Patient will demonstrate/report pain at worst less than or equal to 2/10 to facilitate minimal limitation in daily activity secondary to pain symptoms.      Time 8    Period Weeks    Status Achieved      PT LONG TERM GOAL #2   Title Patient will demonstrate independent use of home exercise program to facilitate ability to maintain/progress functional gains from skilled physical therapy services.  Time 6    Period Weeks    Status On-going    Target Date 08/16/20      PT LONG TERM GOAL #3   Title Patient will demonstrate independent ambulation community distances > 300 ft to facilitate community integration at [facility].    Baseline Needs cane    Time 6    Period Weeks    Status On-going    Target Date 08/16/20      PT LONG TERM GOAL #4   Title demonstrate 0 degrees Lt knee extension to facilitate improved gait mechanics and improved quad activation    Time 6    Period Weeks    Status Partially Met    Target Date 08/16/20      PT LONG TERM GOAL #5   Title demonstrate 5/5 Lt knee extension strength without pain for improved TKE with ambulation and functional activities    Time 6    Period Weeks    Status On-going    Target Date 08/16/20                 Plan - 07/05/20 1346    Clinical Impression Statement Pt returns to PT after 4 weeks from last session, and not formally d/c'ed from prior episode.  Recertification completed today to address current deficits. Pt is lacking ~ 5 degrees extension, but quickly gets to 0 degrees with extension based stretching.  She has poor quad activation for quad sets and SLR and feel knee extension that is lacking with functional mobility is mostly related to quad weakness.  HEP issued to focus on quad strengthening as well as getting last few degrees of extension.  Will benefit from PT to address deficits listed.    Personal Factors and Comorbidities Comorbidity 3+    Comorbidities bilateral THA, most recent 08/2019.  Chronic renal disease, HTN, obesity    Examination-Activity Limitations Transfers;Locomotion Level;Squat;Stairs;Stand;Sit;Bed Mobility;Bend;Carry    Examination-Participation  Restrictions Community Activity    Stability/Clinical Decision Making Evolving/Moderate complexity    Rehab Potential Good    PT Frequency 2x / week   1-2x/wk   PT Duration 6 weeks    PT Treatment/Interventions ADLs/Self Care Home Management;Electrical Stimulation;Iontophoresis 64m/ml Dexamethasone;Moist Heat;Traction;Balance training;Therapeutic exercise;Therapeutic activities;Functional mobility training;Stair training;Gait training;Ultrasound;Neuromuscular re-education;Patient/family education;Wheelchair mobility training;Manual techniques;Manual lymph drainage;Vasopneumatic Device;Taping;Dry needling;Passive range of motion;Spinal Manipulations;Joint Manipulations    PT Next Visit Plan focus on quad strengthening, TKE exercises    PT Home Exercise Plan Access Code: [email or web address]    Consulted and Agree with Plan of Care Patient           Patient will benefit from skilled therapeutic intervention in order to improve the following deficits and impairments:  Abnormal gait, Decreased endurance, Hypomobility, Increased edema, Decreased activity tolerance, Decreased strength, Pain, Difficulty walking, Decreased mobility, Decreased balance, Decreased range of motion, Impaired perceived functional ability, Impaired flexibility, Decreased coordination  Visit Diagnosis: Chronic pain of left knee - Plan: PT plan of care cert/re-cert  Muscle weakness (generalized) - Plan: PT plan of care cert/re-cert  Difficulty in walking, not elsewhere classified - Plan: PT plan of care cert/re-cert  Localized edema - Plan: PT plan of care cert/re-cert  Stiffness of left knee, not elsewhere classified - Plan: PT plan of care cert/re-cert     Problem List Patient Active Problem List   Diagnosis Date Noted  . S/P total knee arthroplasty, left 03/09/2020  . History of total hip arthroplasty, right 08/21/2019  . Intermittent claudication ([facility] 07/08/2019  . Hypertensive nephropathy 12/15/2018  .  Chronic renal  disease, stage II 12/15/2018  . H/O total hip arthroplasty, left 11/07/2017  . Presence of left artificial hip joint 05/14/2017  . Preop cardiovascular exam 03/12/2017  . Cough   . Abdominal pain   . Symptomatic cholelithiasis 07/01/2016  . Nonspecific ST-T wave electrocardiographic changes   . Lower leg edema 02/16/2015  . Obesity (BMI 30.0-34.9) 02/16/2015  . Essential hypertension 01/11/2012  . Hyperthyroidism 01/11/2012  . Hypercholesteremia 01/11/2012  . H/O vaginal hysterectomy 1974 01/11/2012    Class: History of  . S/P removal of lung  partial R lobe  2010 01/11/2012  . History of hernia repair  R ing. 1975 01/11/2012  . BACK PAIN 11/12/2007  . DYSPNEA 11/11/2007      Laureen Abrahams, PT, DPT 07/05/20 1:54 PM     [facility] [address] [place], [place], [address] Phone: [phone]   Fax:  [phone]  Name: [name] MRN: [id number] Date of Birth: Dec 28, 1932

## 2020-07-13 ENCOUNTER — Ambulatory Visit (INDEPENDENT_AMBULATORY_CARE_PROVIDER_SITE_OTHER): Payer: Medicare Other

## 2020-07-13 ENCOUNTER — Other Ambulatory Visit: Payer: Self-pay

## 2020-07-13 ENCOUNTER — Ambulatory Visit: Payer: Medicare Other | Admitting: Internal Medicine

## 2020-07-13 VITALS — BP 146/99 | HR 51 | Temp 97.2°F | Ht 61.0 in | Wt 183.0 lb

## 2020-07-13 DIAGNOSIS — Z0000 Encounter for general adult medical examination without abnormal findings: Secondary | ICD-10-CM

## 2020-07-13 NOTE — Patient Instructions (Signed)
[name] , Thank you for taking time to come for your Medicare Wellness Visit. I appreciate your ongoing commitment to your health goals. Please review the following plan we discussed and let me know if I can assist you in the future.   Screening recommendations/referrals: Colonoscopy: not required Mammogram: completed 08/26/2019 Bone Density: completed 08/26/2019 Recommended yearly ophthalmology/optometry visit for glaucoma screening and checkup Recommended yearly dental visit for hygiene and checkup  Vaccinations: Influenza vaccine: decline Pneumococcal vaccine: decline Tdap vaccine: decline Shingles vaccine: decline   Covid-19: 12/30/2019, 12/05/2019  Advanced directives: Please bring a copy of your POA (Power of Attorney) and/or Living Will to your next appointment.   Conditions/risks identified: none  Next appointment: 12/27/2020 at 10:30  Follow up in one year for your annual wellness visit    Preventive Care 65 Years and Older, Female Preventive care refers to lifestyle choices and visits with your health care provider that can promote health and wellness. What does preventive care include?  A yearly physical exam. This is also called an annual well check.  Dental exams once or twice a year.  Routine eye exams. Ask your health care provider how often you should have your eyes checked.  Personal lifestyle choices, including:  Daily care of your teeth and gums.  Regular physical activity.  Eating a healthy diet.  Avoiding tobacco and drug use.  Limiting alcohol use.  Practicing safe sex.  Taking low-dose aspirin every day.  Taking vitamin and mineral supplements as recommended by your health care provider. What happens during an annual well check? The services and screenings done by your health care provider during your annual well check will depend on your age, overall health, lifestyle risk factors, and family history of disease. Counseling  Your health  care provider may ask you questions about your:  Alcohol use.  Tobacco use.  Drug use.  Emotional well-being.  Home and relationship well-being.  Sexual activity.  Eating habits.  History of falls.  Memory and ability to understand (cognition).  Work and work Statistician.  Reproductive health. Screening  You may have the following tests or measurements:  Height, weight, and BMI.  Blood pressure.  Lipid and cholesterol levels. These may be checked every 5 years, or more frequently if you are over 84 years old.  Skin check.  Lung cancer screening. You may have this screening every year starting at age 84 if you have a 30-pack-year history of smoking and currently smoke or have quit within the past 15 years.  Fecal occult blood test (FOBT) of the stool. You may have this test every year starting at age 84.  Flexible sigmoidoscopy or colonoscopy. You may have a sigmoidoscopy every 5 years or a colonoscopy every 10 years starting at age 84.  Hepatitis C blood test.  Hepatitis B blood test.  Sexually transmitted disease (STD) testing.  Diabetes screening. This is done by checking your blood sugar (glucose) after you have not eaten for a while (fasting). You may have this done every 1-3 years.  Bone density scan. This is done to screen for osteoporosis. You may have this done starting at age 84.  Mammogram. This may be done every 1-2 years. Talk to your health care provider about how often you should have regular mammograms. Talk with your health care provider about your test results, treatment options, and if necessary, the need for more tests. Vaccines  Your health care provider may recommend certain vaccines, such as:  Influenza vaccine. This is recommended  every year.  Tetanus, diphtheria, and acellular pertussis (Tdap, Td) vaccine. You may need a Td booster every 10 years.  Zoster vaccine. You may need this after age 39.  Pneumococcal 13-valent conjugate  (PCV13) vaccine. One dose is recommended after age 16.  Pneumococcal polysaccharide (PPSV23) vaccine. One dose is recommended after age 70. Talk to your health care provider about which screenings and vaccines you need and how often you need them. This information is not intended to replace advice given to you by your health care provider. Make sure you discuss any questions you have with your health care provider. Document Released: 10/21/2015 Document Revised: 06/13/2016 Document Reviewed: 07/26/2015 Elsevier Interactive Patient Education  2017 [place] Prevention in the Home Falls can cause injuries. They can happen to people of all ages. There are many things you can do to make your home safe and to help prevent falls. What can I do on the outside of my home?  Regularly fix the edges of walkways and driveways and fix any cracks.  Remove anything that might make you trip as you walk through a door, such as a raised step or threshold.  Trim any bushes or trees on the path to your home.  Use bright outdoor lighting.  Clear any walking paths of anything that might make someone trip, such as rocks or tools.  Regularly check to see if handrails are loose or broken. Make sure that both sides of any steps have handrails.  Any raised decks and porches should have guardrails on the edges.  Have any leaves, snow, or ice cleared regularly.  Use sand or salt on walking paths during winter.  Clean up any spills in your garage right away. This includes oil or grease spills. What can I do in the bathroom?  Use night lights.  Install grab bars by the toilet and in the tub and shower. Do not use towel bars as grab bars.  Use non-skid mats or decals in the tub or shower.  If you need to sit down in the shower, use a plastic, non-slip stool.  Keep the floor dry. Clean up any water that spills on the floor as soon as it happens.  Remove soap buildup in the tub or shower  regularly.  Attach bath mats securely with double-sided non-slip rug tape.  Do not have throw rugs and other things on the floor that can make you trip. What can I do in the bedroom?  Use night lights.  Make sure that you have a light by your bed that is easy to reach.  Do not use any sheets or blankets that are too big for your bed. They should not hang down onto the floor.  Have a firm chair that has side arms. You can use this for support while you get dressed.  Do not have throw rugs and other things on the floor that can make you trip. What can I do in the kitchen?  Clean up any spills right away.  Avoid walking on wet floors.  Keep items that you use a lot in easy-to-reach places.  If you need to reach something above you, use a strong step stool that has a grab bar.  Keep electrical cords out of the way.  Do not use floor polish or wax that makes floors slippery. If you must use wax, use non-skid floor wax.  Do not have throw rugs and other things on the floor that can make you trip. What  can I do with my stairs?  Do not leave any items on the stairs.  Make sure that there are handrails on both sides of the stairs and use them. Fix handrails that are broken or loose. Make sure that handrails are as long as the stairways.  Check any carpeting to make sure that it is firmly attached to the stairs. Fix any carpet that is loose or worn.  Avoid having throw rugs at the top or bottom of the stairs. If you do have throw rugs, attach them to the floor with carpet tape.  Make sure that you have a light switch at the top of the stairs and the bottom of the stairs. If you do not have them, ask someone to add them for you. What else can I do to help prevent falls?  Wear shoes that:  Do not have high heels.  Have rubber bottoms.  Are comfortable and fit you well.  Are closed at the toe. Do not wear sandals.  If you use a stepladder:  Make sure that it is fully  opened. Do not climb a closed stepladder.  Make sure that both sides of the stepladder are locked into place.  Ask someone to hold it for you, if possible.  Clearly mark and make sure that you can see:  Any grab bars or handrails.  First and last steps.  Where the edge of each step is.  Use tools that help you move around (mobility aids) if they are needed. These include:  Canes.  Walkers.  Scooters.  Crutches.  Turn on the lights when you go into a dark area. Replace any light bulbs as soon as they burn out.  Set up your furniture so you have a clear path. Avoid moving your furniture around.  If any of your floors are uneven, fix them.  If there are any pets around you, be aware of where they are.  Review your medicines with your doctor. Some medicines can make you feel dizzy. This can increase your chance of falling. Ask your doctor what other things that you can do to help prevent falls. This information is not intended to replace advice given to you by your health care provider. Make sure you discuss any questions you have with your health care provider. Document Released: 07/21/2009 Document Revised: 03/01/2016 Document Reviewed: 10/29/2014 Elsevier Interactive Patient Education  2017 [employer].

## 2020-07-13 NOTE — Progress Notes (Signed)
I connected with Tonia Avino today by telephone and verified that I am speaking with the correct person using two identifiers. Location patient: home Location provider: work Persons participating in the virtual visit: Dayzha, Pogosyan LPN.   I discussed the limitations, risks, security and privacy concerns of performing an evaluation and management service by telephone and the availability of in person appointments. I also discussed with the patient that there may be a patient responsible charge related to this service. The patient expressed understanding and verbally consented to this telephonic visit.    Interactive audio and video telecommunications were attempted between this provider and patient, however failed, due to patient having technical difficulties OR patient did not have access to video capability.  We continued and completed visit with audio only.     Vital signs may be patient reported or missing.  Subjective:   [name] is a 86 y.o. female who presents for Medicare Annual (Subsequent) preventive examination.  Review of Systems     Cardiac Risk Factors include: advanced age (>24men, >72 women);hypertension;obesity (BMI >30kg/m2)     Objective:    Today's Vitals   07/13/20 1141  BP: (!) 146/99  Pulse: (!) 51  Temp: (!) 97.2 F (36.2 C)  Weight: 183 lb (83 kg)  Height: 5\' 1"  (1.549 m)   Body mass index is 34.58 kg/m.  Advanced Directives 07/13/2020 02/24/2020 02/18/2020 07/08/2019 12/19/2018 12/08/2018 09/09/2018  Does Patient Have a Medical Advance Directive? Yes No No No Yes No Yes  Type of Paramedic of [place];Living will - - - [place];Living will - Living will  Does patient want to make changes to medical advance directive? - - - - No - Patient declined - No - Patient declined  Copy of [place] in Chart? No - copy requested - - - No - copy requested - -  Would patient like  information on creating a medical advance directive? - No - Patient declined No - Patient declined - No - Patient declined No - Patient declined -    Current Medications (verified) Outpatient Encounter Medications as of 07/13/2020  Medication Sig  . Ascorbic Acid 500 MG CHEW Chew 500 mg by mouth daily.   . busPIRone (BUSPAR) 5 MG tablet Take 1 tablet (5 mg total) by mouth 2 (two) times daily. (Patient taking differently: Take 5 mg by mouth daily as needed (anxiety). )  . calcium carbonate (CALCIUM 600) 600 MG TABS tablet Take 600 mg by mouth daily.  . cholecalciferol (VITAMIN D3) 25 MCG (1000 UT) tablet Take 1,000 Units by mouth daily.  . diclofenac Sodium (VOLTAREN) 1 % GEL APPLY 2 GRAMS TOPICALLY FOUR TIMES A DAY  . Flaxseed, Linseed, (FLAXSEED OIL) 1000 MG CAPS Take 1,000 mg by mouth daily.  . furosemide (LASIX) 20 MG tablet TAKE 1 TABLET DAILY  . methimazole (TAPAZOLE) 5 MG tablet Take 2.5 mg by mouth daily.   . metoprolol succinate (TOPROL XL) 25 MG 24 hr tablet Take 1 tablet (25 mg total) by mouth daily.  Marland Kitchen nystatin cream (MYCOSTATIN) APPLY CREAM TO AFFECTED AREA TWICE A DAY AS NEEDED (Patient taking differently: Apply 1 application topically 2 (two) times daily as needed for dry skin. )  . potassium chloride SA (KLOR-CON) 20 MEQ tablet Take 1 tablet (20 mEq total) by mouth daily.  . TRICOR 145 MG tablet Take 1 tablet (145 mg total) by mouth daily.  Marland Kitchen HYDROcodone-acetaminophen (NORCO/VICODIN) 5-325 MG tablet Take  1 tablet by mouth every 6 (six) hours as needed for moderate pain. (Patient not taking: Reported on 07/13/2020)  . methocarbamol (ROBAXIN) 500 MG tablet Take 1 tablet (500 mg total) by mouth every 6 (six) hours as needed for muscle spasms. (Patient not taking: Reported on 07/13/2020)  . sulfamethoxazole-trimethoprim (BACTRIM DS) 800-160 MG tablet 1 po bid x 5 days (Patient not taking: Reported on 07/13/2020)   No facility-administered encounter medications on file as of 07/13/2020.     Allergies (verified) Ibuprofen and Naproxen sodium   History: Past Medical History:  Diagnosis Date  . Aortic stenosis    Echo 07/05/16: Very mild AS, Mean gradient 10 mm Hg, Peak gradient (S) 19 mmHg   . Arthritis    left hip and back  . Bronchiectasis    isolated to RML; status post right middle lobe partial resection.  . Complication of anesthesia    hard to wake up  . Dyspnea   . Gall stones   . GERD (gastroesophageal reflux disease)    history   . H/O osteoporosis   . Hypertension   . Hyperthyroidism    endocrinologist - Dr. Chalmers Cater  . Pneumonia   . Yeast infection    Past Surgical History:  Procedure Laterality Date  . ABDOMINAL HYSTERECTOMY  1974  . CHOLECYSTECTOMY N/A 07/02/2016   Procedure: LAPAROSCOPIC CHOLECYSTECTOMY;  Surgeon: Stark Klein, MD;  Location: [place];  Service: General;  Laterality: N/A;  . COLONOSCOPY    . ENDOSCOPIC RETROGRADE CHOLANGIOPANCREATOGRAPHY (ERCP) WITH PROPOFOL N/A 08/07/2018   Procedure: ENDOSCOPIC RETROGRADE CHOLANGIOPANCREATOGRAPHY (ERCP) WITH PROPOFOL;  Surgeon: Carol Ada, MD;  Location: [facility];  Service: Endoscopy;  Laterality: N/A;  . EYE SURGERY     stye removed  . HARDWARE REMOVAL Left 02/24/2020   Procedure: Hardware Removal, left knee;  Surgeon: [name], MD;  Location: [place];  Service: Orthopedics;  Laterality: Left;  . HERNIA REPAIR  1975  . KNEE SURGERY Left   . LUNG REMOVAL, PARTIAL  208   RML  . NM MYOVIEW LTD  06/2011   Normal EF. No ischemia or infarction.  [name]  08/07/2018   Procedure: [name];  Surgeon: Carol Ada, MD;  Location: [facility];  Service: Endoscopy;;  balloon sweep  . STERIOD INJECTION Right 02/24/2020   Procedure: RIGHT KNEE STEROID INTRA-ARTICULAR MARCAINE/DEPO-MEDROL INJECTION UNDER ANESTHESIA;  Surgeon: [name], MD;  Location: [place];  Service: Orthopedics;  Laterality: Right;  . TOTAL HIP ARTHROPLASTY Left 04/05/2017   Procedure: LEFT TOTAL HIP  ARTHROPLASTY ANTERIOR APPROACH;  Surgeon: [name], MD;  Location: [place];  Service: Orthopedics;  Laterality: Left;  . TOTAL HIP ARTHROPLASTY Right 12/19/2018  . TOTAL HIP ARTHROPLASTY Right 12/19/2018   Procedure: RIGHT TOTAL HIP ARTHROPLASTY-DIRECT ANTERIOR APPROACH;  Surgeon: [name], MD;  Location: [place];  Service: Orthopedics;  Laterality: Right;  . TOTAL KNEE ARTHROPLASTY Left 02/24/2020   Procedure: LEFT TOTAL KNEE ARTHROPLASTY;  Surgeon: [name], MD;  Location: [place];  Service: Orthopedics;  Laterality: Left;  . TRANSTHORACIC ECHOCARDIOGRAM  06/2011   Mild concentric LVH. Normal EF with impaired relaxation. Mildly elevated RV pressures of 30 and 40 mmHg.  Marland Kitchen TRANSTHORACIC ECHOCARDIOGRAM  06/2016   normal LV size and function. EF 60-65% with GRD 2 DD. Mild aortic stenosis. Mild LA dilation. Mild to moderately increased PA pressures (46 mmHg).  . TUBAL LIGATION     Family History  Problem Relation Age of Onset  . Hypertension Mother   .  CVA Mother 46  . Heart attack Sister   . Diabetes Sister   . Healthy Father   . Heart attack Brother   . Heart attack Brother    Social History   Socioeconomic History  . Marital status: Widowed    Spouse name: Not on file  . Number of children: Not on file  . Years of education: Not on file  . Highest education level: Not on file  Occupational History  . Occupation: retired  Tobacco Use  . Smoking status: Never Smoker  . Smokeless tobacco: Never Used  Vaping Use  . Vaping Use: Never used  Substance and Sexual Activity  . Alcohol use: No  . Drug use: No  . Sexual activity: Not Currently  Other Topics Concern  . Not on file  Social History Narrative   She is a widowed mother of 3, grandmother for, a great-grandmother 48.   She has been trying to do more exercise than before, but has been injured recently by her back pain.   She never smoked, doesn't drink alcohol.   Social Determinants of Health   Financial Resource  Strain: Low Risk   . Difficulty of Paying Living Expenses: Not hard at all  Food Insecurity: No Food Insecurity  . Worried About Charity fundraiser in the Last Year: Never true  . Ran Out of Food in the Last Year: Never true  Transportation Needs: No Transportation Needs  . Lack of Transportation (Medical): No  . Lack of Transportation (Non-Medical): No  Physical Activity: Sufficiently Active  . Days of Exercise per Week: 7 days  . Minutes of Exercise per Session: 30 min  Stress: No Stress Concern Present  . Feeling of Stress : Not at all  Social Connections:   . Frequency of Communication with Friends and Family: Not on file  . Frequency of Social Gatherings with Friends and Family: Not on file  . Attends Religious Services: Not on file  . Active Member of Clubs or Organizations: Not on file  . Attends Archivist Meetings: Not on file  . Marital Status: Not on file    Tobacco Counseling Counseling given: Not Answered   Clinical Intake:  Pre-visit preparation completed: Yes  Pain : No/denies pain     Nutritional Status: BMI > 30  Obese Nutritional Risks: None Diabetes: No  How often do you need to have someone help you when you read instructions, pamphlets, or other written materials from your doctor or pharmacy?: 1 - Never What is the last grade level you completed in school?: cna classes  Diabetic? No   Interpreter Needed?: No  Information entered by :: NAllen LPN   Activities of Daily Living In your present state of health, do you have any difficulty performing the following activities: 07/13/2020 02/26/2020  Hearing? N -  Vision? N -  Difficulty concentrating or making decisions? N -  Walking or climbing stairs? N -  Dressing or bathing? N -  Doing errands, shopping? - N  Conservation officer, nature and eating ? N -  Using the Toilet? N -  In the past six months, have you accidently leaked urine? Y -  Do you have problems with loss of bowel control? N -   Managing your Medications? N -  Managing your Finances? N -  Housekeeping or managing your Housekeeping? N -  Some recent data might be hidden    Patient Care Team: Glendale Chard, MD as PCP - General (Internal Medicine) Ellyn Hack,  Leonie Connon, MD as PCP - Cardiology (Cardiology) [name], MD as Consulting Physician (Orthopedic Surgery)  Indicate any recent Medical Services you may have received from other than [facility] providers in the past year (date may be approximate).     Assessment:   This is a routine wellness examination for Darriel.  Hearing/Vision screen  Hearing Screening   125Hz  250Hz  500Hz  1000Hz  2000Hz  3000Hz  4000Hz  6000Hz  8000Hz   Right ear:           Left ear:           Vision Screening Comments: Regular eye exams,   Dietary issues and exercise activities discussed: Current Exercise Habits: Home exercise routine, Type of exercise: Other - see comments (stationary bike), Time (Minutes): 30, Frequency (Times/Week): 7, Weekly Exercise (Minutes/Week): 210  Goals    . Patient Stated     07/13/2020, walk without walker and cane    . Weight (lb) < 200 lb (90.7 kg)     07/08/2019, wants to weigh 180      Depression Screen PHQ 2/9 Scores 07/13/2020 01/05/2020 08/12/2019 07/08/2019 07/08/2019 04/01/2019 12/15/2018  PHQ - 2 Score 0 1 0 0 0 0 0  PHQ- 9 Score - 7 - - - - -    Fall Risk Fall Risk  07/13/2020 01/05/2020 08/12/2019 07/08/2019 07/08/2019  Falls in the past year? 0 - 0 0 0  Comment - - - - -  Number falls in past yr: - 0 - - -  Injury with Fall? - 0 - - -  Risk for fall due to : Impaired balance/gait;Medication side effect;Orthopedic patient - - Impaired balance/gait;Impaired mobility -  Follow up Falls evaluation completed;Education provided;Falls prevention discussed - - Falls evaluation completed;Education provided;Falls prevention discussed -    Any stairs in or around the home? Yes  If so, are there any without handrails? No  Home free of loose throw rugs in  walkways, pet beds, electrical cords, etc? Yes  Adequate lighting in your home to reduce risk of falls? Yes   ASSISTIVE DEVICES UTILIZED TO PREVENT FALLS:  Life alert? Yes  Use of a cane, walker or w/c? Yes  Grab bars in the bathroom? Yes  Shower chair or bench in shower? Yes  Elevated toilet seat or a handicapped toilet? Yes   TIMED UP AND GO:  Was the test performed? No .  Cognitive Function:     6CIT Screen 07/13/2020 07/08/2019  What Year? 0 points 0 points  What month? 0 points 0 points  What time? 0 points 0 points  Count back from 20 2 points 0 points  Months in reverse 2 points 0 points  Repeat phrase 0 points 0 points  Total Score 4 0    Immunizations Immunization History  Administered Date(s) Administered  . PFIZER SARS-COV-2 Vaccination 12/05/2019, 12/30/2019    TDAP status: Due, Education has been provided regarding the importance of this vaccine. Advised may receive this vaccine at local pharmacy or Health Dept. Aware to provide a copy of the vaccination record if obtained from local pharmacy or Health Dept. Verbalized acceptance and understanding. Flu Vaccine status: Declined, Education has been provided regarding the importance of this vaccine but patient still declined. Advised may receive this vaccine at local pharmacy or Health Dept. Aware to provide a copy of the vaccination record if obtained from local pharmacy or Health Dept. Verbalized acceptance and understanding. Pneumococcal vaccine status: Declined,  Education has been provided regarding the importance of this vaccine but patient  still declined. Advised may receive this vaccine at local pharmacy or Health Dept. Aware to provide a copy of the vaccination record if obtained from local pharmacy or Health Dept. Verbalized acceptance and understanding.  Covid-19 vaccine status: Completed vaccines  Qualifies for Shingles Vaccine? Yes   Zostavax completed No   Shingrix Completed?: No.    Education has been  provided regarding the importance of this vaccine. Patient has been advised to call insurance company to determine out of pocket expense if they have not yet received this vaccine. Advised may also receive vaccine at local pharmacy or Health Dept. Verbalized acceptance and understanding.  Screening Tests Health Maintenance  Topic Date Due  . TETANUS/TDAP  Never done  . PNA vac Low Risk Adult (1 of 2 - PCV13) Never done  . INFLUENZA VACCINE  01/05/2021 (Originally 05/08/2020)  . MAMMOGRAM  08/25/2020  . DEXA SCAN  Completed  . COVID-19 Vaccine  Completed    Health Maintenance  Health Maintenance Due  Topic Date Due  . TETANUS/TDAP  Never done  . PNA vac Low Risk Adult (1 of 2 - PCV13) Never done    Colorectal cancer screening: No longer required.  Mammogram status: Completed 08/26/2019. Repeat every year Bone Density status: Completed 08/26/2019.   Lung Cancer Screening: (Low Dose CT Chest recommended if Age 56-80 years, 30 pack-year currently smoking OR have quit w/in 15years.) does not qualify.   Lung Cancer Screening Referral: no  Additional Screening:  Hepatitis C Screening: does not qualify;   Vision Screening: Recommended annual ophthalmology exams for early detection of glaucoma and other disorders of the eye. Is the patient up to date with their annual eye exam?  Yes  Who is the provider or what is the name of the office in which the patient attends annual eye exams? Can't remember If pt is not established with a provider, would they like to be referred to a provider to establish care? No .   Dental Screening: Recommended annual dental exams for proper oral hygiene  Community Resource Referral / Chronic Care Management: CRR required this visit?  No   CCM required this visit?  No      Plan:     I have personally reviewed and noted the following in the patient's chart:   . Medical and social history . Use of alcohol, tobacco or illicit drugs  . Current  medications and supplements . Functional ability and status . Nutritional status . Physical activity . Advanced directives . List of other physicians . Hospitalizations, surgeries, and ER visits in previous 12 months . Vitals . Screenings to include cognitive, depression, and falls . Referrals and appointments  In addition, I have reviewed and discussed with patient certain preventive protocols, quality metrics, and best practice recommendations. A written personalized care plan for preventive services as well as general preventive health recommendations were provided to patient.     Kellie Simmering, LPN   17/0/0174   Nurse Notes:

## 2020-07-17 ENCOUNTER — Other Ambulatory Visit: Payer: Self-pay | Admitting: Internal Medicine

## 2020-07-20 ENCOUNTER — Other Ambulatory Visit: Payer: Self-pay

## 2020-07-20 ENCOUNTER — Encounter: Payer: Self-pay | Admitting: Rehabilitative and Restorative Service Providers"

## 2020-07-20 ENCOUNTER — Ambulatory Visit (INDEPENDENT_AMBULATORY_CARE_PROVIDER_SITE_OTHER): Payer: Medicare Other | Admitting: Rehabilitative and Restorative Service Providers"

## 2020-07-20 DIAGNOSIS — M25662 Stiffness of left knee, not elsewhere classified: Secondary | ICD-10-CM

## 2020-07-20 DIAGNOSIS — M25562 Pain in left knee: Secondary | ICD-10-CM

## 2020-07-20 DIAGNOSIS — G8929 Other chronic pain: Secondary | ICD-10-CM | POA: Diagnosis not present

## 2020-07-20 DIAGNOSIS — R262 Difficulty in walking, not elsewhere classified: Secondary | ICD-10-CM

## 2020-07-20 NOTE — Patient Instructions (Signed)
Access Code: [email or web address] URL: [email or web address] Date: 07/20/2020 Prepared by: Vista Mink  Exercises Sideways Step Touch - 1 x daily - 7 x weekly - 2 sets - 10 reps Standing Tandem Balance with Counter Support - 2 x daily - 7 x weekly - 1 sets - 5 reps - 20 seconds hold

## 2020-07-20 NOTE — Therapy (Signed)
[place], [place], [address] Phone: [phone]   Fax:  [phone]  Physical Therapy Treatment/Reassessment  Patient Details  Name: [name] MRN: [id number] Date of Birth: [birth year] Referring Provider (PT): Dr. Lorin Mercy   Encounter Date: 07/20/2020   PT End of Session - 07/20/20 1642    Visit Number 20    Number of Visits 24    Date for PT Re-Evaluation 08/16/20    Authorization - Visit Number 20    Progress Note Due on Visit 25    PT Start Time 1430    PT Stop Time 1515    PT Time Calculation (min) 45 min    Activity Tolerance Patient tolerated treatment well;No increased pain    Behavior During Therapy WFL for tasks assessed/performed           Past Medical History:  Diagnosis Date  . Aortic stenosis    Echo 07/05/16: Very mild AS, Mean gradient 10 mm Hg, Peak gradient (S) 19 mmHg   . Arthritis    left hip and back  . Bronchiectasis    isolated to RML; status post right middle lobe partial resection.  . Complication of anesthesia    hard to wake up  . Dyspnea   . Gall stones   . GERD (gastroesophageal reflux disease)    history   . H/O osteoporosis   . Hypertension   . Hyperthyroidism    endocrinologist - Dr. Chalmers Cater  . Pneumonia   . Yeast infection     Past Surgical History:  Procedure Laterality Date  . ABDOMINAL HYSTERECTOMY  1974  . CHOLECYSTECTOMY N/A 07/02/2016   Procedure: LAPAROSCOPIC CHOLECYSTECTOMY;  Surgeon: Stark Klein, MD;  Location: [place];  Service: General;  Laterality: N/A;  . COLONOSCOPY    . ENDOSCOPIC RETROGRADE CHOLANGIOPANCREATOGRAPHY (ERCP) WITH PROPOFOL N/A 08/07/2018   Procedure: ENDOSCOPIC RETROGRADE CHOLANGIOPANCREATOGRAPHY (ERCP) WITH PROPOFOL;  Surgeon: Carol Ada, MD;  [facility];  Service: Endoscopy;  Laterality: N/A;  . EYE SURGERY     stye removed  . HARDWARE REMOVAL Left 02/24/2020   Procedure: Hardware Removal, left knee;  Surgeon: [name], MD;  Location: [place];  Service: Orthopedics;  Laterality: Left;  . HERNIA REPAIR  1975  . KNEE SURGERY Left   . LUNG REMOVAL, PARTIAL  208   RML  . NM MYOVIEW LTD  06/2011   Normal EF. No ischemia or infarction.  [name]  08/07/2018   Procedure: [name];  Surgeon: Carol Ada, MD;  [facility];  Service: Endoscopy;;  balloon sweep  . STERIOD INJECTION Right 02/24/2020   Procedure: RIGHT KNEE STEROID INTRA-ARTICULAR MARCAINE/DEPO-MEDROL INJECTION UNDER ANESTHESIA;  Surgeon: [name], MD;  Location: [place];  Service: Orthopedics;  Laterality: Right;  . TOTAL HIP ARTHROPLASTY Left 04/05/2017   Procedure: LEFT TOTAL HIP ARTHROPLASTY ANTERIOR APPROACH;  Surgeon: [name], MD;  Location: [place];  Service: Orthopedics;  Laterality: Left;  . TOTAL HIP ARTHROPLASTY Right 12/19/2018  . TOTAL HIP ARTHROPLASTY Right 12/19/2018   Procedure: RIGHT TOTAL HIP ARTHROPLASTY-DIRECT ANTERIOR APPROACH;  Surgeon: [name], MD;  Location: [place];  Service: Orthopedics;  Laterality: Right;  . TOTAL KNEE ARTHROPLASTY Left 02/24/2020   Procedure: LEFT TOTAL KNEE ARTHROPLASTY;  Surgeon: [name], MD;  Location: [place];  Service: Orthopedics;  Laterality: Left;  . TRANSTHORACIC ECHOCARDIOGRAM  06/2011   Mild concentric LVH. Normal EF with impaired relaxation. Mildly elevated RV pressures of 30 and  40 mmHg.  Marland Kitchen TRANSTHORACIC ECHOCARDIOGRAM  06/2016   normal LV size and function. EF 60-65% with GRD 2 DD. Mild aortic stenosis. Mild LA dilation. Mild to moderately increased PA pressures (46 mmHg).  . TUBAL LIGATION      There were no vitals filed for this visit.   Subjective Assessment - 07/20/20 1639    Subjective [name] would like to walk without a cane.    Patient is accompained by: Family member    Limitations Walking;Standing;House hold activities    Patient Stated Goals Reduce pain.  Wants to walk for exercise, ride a bike.    Currently in Pain? Yes    Pain Score 3      Pain Location Knee    Pain Orientation Left    Pain Descriptors / Indicators Aching    Pain Type Chronic pain    Pain Onset More than a month ago    Pain Frequency Intermittent    Aggravating Factors  Too much WB    Pain Relieving Factors Rest, ice    Effect of Pain on Daily Activities Still uses a cane do to confidence without her cane.  She uses a lift with stairs.    Multiple Pain Sites No              [facility] PT Assessment - 07/20/20 0001      AROM   Right Knee Extension 0    Right Knee Flexion 124    Left Knee Extension 0    Left Knee Flexion 107                         [facility] Adult PT Treatment/Exercise - 07/20/20 0001      Therapeutic Activites    Therapeutic Activities Other Therapeutic Activities   Stairs 6" Up and over 2 sets of 10   Other Therapeutic Activities With instructions for handrail and slow eccentrics      Neuro Re-ed    Neuro Re-ed Details  Tandem balance 5X eyes open and 1X eyes closed 20 seconds L/R & R/L      Exercises   Exercises Knee/Hip      Knee/Hip Exercises: Aerobic   Recumbent Bike Seat 4 8 minutes AROM (stretch) and full revolutions       Knee/Hip Exercises: Seated   Long Arc Quad Strengthening;Both;2 sets;5 sets   slow eccentrics seated SLR   Other Seated Knee/Hip Exercises Tailgate knee flexion AROM 2 minutes    Sit to Sand 5 reps;without UE support   slow eccentrics                 PT Education - 07/20/20 1641    Education Details Updated HEP with stair exercise and balance activity.    Person(s) Educated Patient    Methods Explanation;Demonstration;Verbal cues;Handout    Comprehension Verbal cues required;Returned demonstration;Verbalized understanding;Need further instruction               PT Long Term Goals - 07/20/20 1641      PT LONG TERM GOAL #1   Title Patient will demonstrate/report pain at worst less than or equal to 2/10 to facilitate minimal limitation in daily activity secondary to  pain symptoms.    Time 8    Period Weeks    Status On-going      PT LONG TERM GOAL #2   Title Patient will demonstrate independent use of home exercise program to facilitate ability to maintain/progress functional gains  from skilled physical therapy services.    Time 6    Period Weeks    Status On-going      PT LONG TERM GOAL #3   Title Patient will demonstrate independent ambulation community distances > 300 ft to facilitate community integration at [facility].    Baseline Needs cane    Time 6    Period Weeks    Status On-going      PT LONG TERM GOAL #4   Title demonstrate 0 degrees Lt knee extension to facilitate improved gait mechanics and improved quad activation    Time 6    Period Weeks    Status Achieved      PT LONG TERM GOAL #5   Title demonstrate 5/5 Lt knee extension strength without pain for improved TKE with ambulation and functional activities    Time 6    Period Weeks    Status On-going                 Plan - 07/20/20 1646    Personal Factors and Comorbidities Comorbidity 3+    Comorbidities bilateral THA, most recent 08/2019.  Chronic renal disease, HTN, obesity    Examination-Activity Limitations Transfers;Locomotion Level;Squat;Stairs;Stand;Sit;Bed Mobility;Bend;Carry    Examination-Participation Restrictions Community Activity    Stability/Clinical Decision Making Evolving/Moderate complexity    Rehab Potential Good    PT Frequency 2x / week   1-2x/wk   PT Duration 4 weeks    PT Treatment/Interventions ADLs/Self Care Home Management;Electrical Stimulation;Iontophoresis 4mg /ml Dexamethasone;Moist Heat;Traction;Balance training;Therapeutic exercise;Therapeutic activities;Functional mobility training;Stair training;Gait training;Ultrasound;Neuromuscular re-education;Patient/family education;Wheelchair mobility training;Manual techniques;Manual lymph drainage;Vasopneumatic Device;Taping;Dry needling;Passive range of motion;Spinal Manipulations;Joint  Manipulations    PT Next Visit Plan Balance, strength, gait and stair specific activities    PT Home Exercise Plan Access Code: [email or web address]  Access Code: [email or web address]    Consulted and Agree with Plan of Care Patient           Patient will benefit from skilled therapeutic intervention in order to improve the following deficits and impairments:  Abnormal gait, Decreased endurance, Hypomobility, Increased edema, Decreased activity tolerance, Decreased strength, Pain, Difficulty walking, Decreased mobility, Decreased balance, Decreased range of motion, Impaired perceived functional ability, Impaired flexibility, Decreased coordination  Visit Diagnosis: Difficulty in walking, not elsewhere classified  Stiffness of left knee, not elsewhere classified  Chronic pain of left knee     Problem List Patient Active Problem List   Diagnosis Date Noted  . S/P total knee arthroplasty, left 03/09/2020  . History of total hip arthroplasty, right 08/21/2019  . Intermittent claudication ([place]) 07/08/2019  . Hypertensive nephropathy 12/15/2018  . Chronic renal disease, stage II 12/15/2018  . H/O total hip arthroplasty, left 11/07/2017  . Presence of left artificial hip joint 05/14/2017  . Preop cardiovascular exam 03/12/2017  . Cough   . Abdominal pain   . Symptomatic cholelithiasis 07/01/2016  . Nonspecific ST-T wave electrocardiographic changes   . Lower leg edema 02/16/2015  . Obesity (BMI 30.0-34.9) 02/16/2015  . Essential hypertension 01/11/2012  . Hyperthyroidism 01/11/2012  . Hypercholesteremia 01/11/2012  . H/O vaginal hysterectomy 1974 01/11/2012    Class: History of  . S/P removal of lung  partial R lobe  2010 01/11/2012  . History of hernia repair  R ing. 1975 01/11/2012  . BACK PAIN 11/12/2007  . DYSPNEA 11/11/2007    Farley Ly PT, MPT 07/20/2020, 4:51 PM  [facility] [address] [place], [place], [address] Phone: [phone]   Fax:   [phone]  Name: BOBI DAUDELIN MRN: [id number] Date of Birth: [birth year]

## 2020-07-28 ENCOUNTER — Ambulatory Visit (INDEPENDENT_AMBULATORY_CARE_PROVIDER_SITE_OTHER): Payer: Medicare Other | Admitting: Rehabilitative and Restorative Service Providers"

## 2020-07-28 ENCOUNTER — Encounter: Payer: Self-pay | Admitting: Rehabilitative and Restorative Service Providers"

## 2020-07-28 ENCOUNTER — Other Ambulatory Visit: Payer: Self-pay

## 2020-07-28 DIAGNOSIS — M25562 Pain in left knee: Secondary | ICD-10-CM | POA: Diagnosis not present

## 2020-07-28 DIAGNOSIS — G8929 Other chronic pain: Secondary | ICD-10-CM

## 2020-07-28 DIAGNOSIS — M25662 Stiffness of left knee, not elsewhere classified: Secondary | ICD-10-CM

## 2020-07-28 DIAGNOSIS — R600 Localized edema: Secondary | ICD-10-CM

## 2020-07-28 DIAGNOSIS — M6281 Muscle weakness (generalized): Secondary | ICD-10-CM | POA: Diagnosis not present

## 2020-07-28 DIAGNOSIS — R262 Difficulty in walking, not elsewhere classified: Secondary | ICD-10-CM | POA: Diagnosis not present

## 2020-07-28 NOTE — Therapy (Signed)
[place], [place], [address] Phone: [phone]   Fax:  [phone]  Physical Therapy Treatment  Patient Details  Name: [name] MRN: [id number] Date of Birth: [birth year] Referring Provider (PT): Dr. Lorin Mercy   Encounter Date: 07/28/2020   PT End of Session - 07/28/20 1024    Visit Number 21    Number of Visits 24    Date for PT Re-Evaluation 08/16/20    Authorization - Visit Number 21    Progress Note Due on Visit 25    PT Start Time 1020    PT Stop Time 1059    PT Time Calculation (min) 39 min    Activity Tolerance Patient tolerated treatment well    Behavior During Therapy [facility] for tasks assessed/performed           Past Medical History:  Diagnosis Date  . Aortic stenosis    Echo 07/05/16: Very mild AS, Mean gradient 10 mm Hg, Peak gradient (S) 19 mmHg   . Arthritis    left hip and back  . Bronchiectasis    isolated to RML; status post right middle lobe partial resection.  . Complication of anesthesia    hard to wake up  . Dyspnea   . Gall stones   . GERD (gastroesophageal reflux disease)    history   . H/O osteoporosis   . Hypertension   . Hyperthyroidism    endocrinologist - Dr. Chalmers Cater  . Pneumonia   . Yeast infection     Past Surgical History:  Procedure Laterality Date  . ABDOMINAL HYSTERECTOMY  1974  . CHOLECYSTECTOMY N/A 07/02/2016   Procedure: LAPAROSCOPIC CHOLECYSTECTOMY;  Surgeon: Stark Klein, MD;  Location: [place];  Service: General;  Laterality: N/A;  . COLONOSCOPY    . ENDOSCOPIC RETROGRADE CHOLANGIOPANCREATOGRAPHY (ERCP) WITH PROPOFOL N/A 08/07/2018   Procedure: ENDOSCOPIC RETROGRADE CHOLANGIOPANCREATOGRAPHY (ERCP) WITH PROPOFOL;  Surgeon: Carol Ada, MD;  [facility];  Service: Endoscopy;  Laterality: N/A;  . EYE SURGERY     stye removed  . HARDWARE REMOVAL Left 02/24/2020   Procedure: Hardware Removal, left knee;  Surgeon: [name], MD;  Location: [place];   Service: Orthopedics;  Laterality: Left;  . HERNIA REPAIR  1975  . KNEE SURGERY Left   . LUNG REMOVAL, PARTIAL  208   RML  . NM MYOVIEW LTD  06/2011   Normal EF. No ischemia or infarction.  [name]  08/07/2018   Procedure: [name];  Surgeon: Carol Ada, MD;  [facility];  Service: Endoscopy;;  balloon sweep  . STERIOD INJECTION Right 02/24/2020   Procedure: RIGHT KNEE STEROID INTRA-ARTICULAR MARCAINE/DEPO-MEDROL INJECTION UNDER ANESTHESIA;  Surgeon: [name], MD;  Location: [place];  Service: Orthopedics;  Laterality: Right;  . TOTAL HIP ARTHROPLASTY Left 04/05/2017   Procedure: LEFT TOTAL HIP ARTHROPLASTY ANTERIOR APPROACH;  Surgeon: [name], MD;  Location: [place];  Service: Orthopedics;  Laterality: Left;  . TOTAL HIP ARTHROPLASTY Right 12/19/2018  . TOTAL HIP ARTHROPLASTY Right 12/19/2018   Procedure: RIGHT TOTAL HIP ARTHROPLASTY-DIRECT ANTERIOR APPROACH;  Surgeon: [name], MD;  Location: [place];  Service: Orthopedics;  Laterality: Right;  . TOTAL KNEE ARTHROPLASTY Left 02/24/2020   Procedure: LEFT TOTAL KNEE ARTHROPLASTY;  Surgeon: [name], MD;  Location: [place];  Service: Orthopedics;  Laterality: Left;  . TRANSTHORACIC ECHOCARDIOGRAM  06/2011   Mild concentric LVH. Normal EF with impaired relaxation. Mildly elevated RV pressures of 30 and 40 mmHg.  Marland Kitchen  TRANSTHORACIC ECHOCARDIOGRAM  06/2016   normal LV size and function. EF 60-65% with GRD 2 DD. Mild aortic stenosis. Mild LA dilation. Mild to moderately increased PA pressures (46 mmHg).  . TUBAL LIGATION      There were no vitals filed for this visit.   Subjective Assessment - 07/28/20 1021    Subjective Pt. arrived stating she wasn't going to complaint about anything today.    Patient is accompained by: --    Limitations Walking;Standing;House hold activities    Patient Stated Goals Reduce pain.  Wants to walk for exercise, ride a bike.    Currently in Pain? Yes   "not enough to  complaint about"   Pain Onset More than a month ago    Aggravating Factors  Pt. did not communicate specifics despite questioning.                             [place] Adult PT Treatment/Exercise - 07/28/20 0001      Neuro Re-ed    Neuro Re-ed Details  tandem stance EO 1 min x 1 bilateral, BIG inspired retro step x 15 bilateral      Knee/Hip Exercises: Aerobic   Recumbent Bike Seat 5 full revolutions 7 mins lvl 2      Knee/Hip Exercises: Standing   Other Standing Knee Exercises lateral stepping in // bars 10 ft x 5 each way      Knee/Hip Exercises: Seated   Sit to Sand without UE support;10 reps                  PT Education - 07/28/20 1044    Education Details Continued cues in intervention for reasoning    Person(s) Educated Patient               PT Long Term Goals - 07/20/20 1641      PT LONG TERM GOAL #1   Title Patient will demonstrate/report pain at worst less than or equal to 2/10 to facilitate minimal limitation in daily activity secondary to pain symptoms.    Time 8    Period Weeks    Status On-going      PT LONG TERM GOAL #2   Title Patient will demonstrate independent use of home exercise program to facilitate ability to maintain/progress functional gains from skilled physical therapy services.    Time 6    Period Weeks    Status On-going      PT LONG TERM GOAL #3   Title Patient will demonstrate independent ambulation community distances > 300 ft to facilitate community integration at [facility].    Baseline Needs cane    Time 6    Period Weeks    Status On-going      PT LONG TERM GOAL #4   Title demonstrate 0 degrees Lt knee extension to facilitate improved gait mechanics and improved quad activation    Time 6    Period Weeks    Status Achieved      PT LONG TERM GOAL #5   Title demonstrate 5/5 Lt knee extension strength without pain for improved TKE with ambulation and functional activities    Time 6    Period Weeks     Status On-going                 Plan - 07/28/20 1040    Clinical Impression Statement Continued demonstrated and reported fatigue in intervention grossly as well as  reluctance to challenege balance in dynamic and compliant surface activity.  Pt. demonstrated fair to good control in static non compliant surface balance.    Personal Factors and Comorbidities Comorbidity 3+    Comorbidities bilateral THA, most recent 08/2019.  Chronic renal disease, HTN, obesity    Examination-Activity Limitations Transfers;Locomotion Level;Squat;Stairs;Stand;Sit;Bed Mobility;Bend;Carry    Examination-Participation Restrictions Community Activity    Stability/Clinical Decision Making Evolving/Moderate complexity    Rehab Potential Good    PT Frequency 2x / week   1-2x/wk   PT Duration 4 weeks    PT Treatment/Interventions ADLs/Self Care Home Management;Electrical Stimulation;Iontophoresis 4mg /ml Dexamethasone;Moist Heat;Traction;Balance training;Therapeutic exercise;Therapeutic activities;Functional mobility training;Stair training;Gait training;Ultrasound;Neuromuscular re-education;Patient/family education;Wheelchair mobility training;Manual techniques;Manual lymph drainage;Vasopneumatic Device;Taping;Dry needling;Passive range of motion;Spinal Manipulations;Joint Manipulations    PT Next Visit Plan Encourage compliant surface and dynamic balance activity for independent ambulation transitioning as appropriate.    PT Home Exercise Plan Access Code: [email or web address]    Consulted and Agree with Plan of Care Patient           Patient will benefit from skilled therapeutic intervention in order to improve the following deficits and impairments:  Abnormal gait, Decreased endurance, Hypomobility, Increased edema, Decreased activity tolerance, Decreased strength, Pain, Difficulty walking, Decreased mobility, Decreased balance, Decreased range of motion, Impaired perceived functional ability, Impaired flexibility,  Decreased coordination  Visit Diagnosis: Chronic pain of left knee  Stiffness of left knee, not elsewhere classified  Difficulty in walking, not elsewhere classified  Muscle weakness (generalized)  Localized edema     Problem List Patient Active Problem List   Diagnosis Date Noted  . S/P total knee arthroplasty, left 03/09/2020  . History of total hip arthroplasty, right 08/21/2019  . Intermittent claudication ([place]) 07/08/2019  . Hypertensive nephropathy 12/15/2018  . Chronic renal disease, stage II 12/15/2018  . H/O total hip arthroplasty, left 11/07/2017  . Presence of left artificial hip joint 05/14/2017  . Preop cardiovascular exam 03/12/2017  . Cough   . Abdominal pain   . Symptomatic cholelithiasis 07/01/2016  . Nonspecific ST-T wave electrocardiographic changes   . Lower leg edema 02/16/2015  . Obesity (BMI 30.0-34.9) 02/16/2015  . Essential hypertension 01/11/2012  . Hyperthyroidism 01/11/2012  . Hypercholesteremia 01/11/2012  . H/O vaginal hysterectomy 1974 01/11/2012    Class: History of  . S/P removal of lung  partial R lobe  2010 01/11/2012  . History of hernia repair  R ing. 1975 01/11/2012  . BACK PAIN 11/12/2007  . DYSPNEA 11/11/2007    Scot Jun, PT, DPT, OCS, ATC 07/28/20  10:51 AM    [facility] [address] [place], [place], [address] Phone: [phone]   Fax:  [phone]  Name: [name] MRN: [id number] Date of Birth: 1933-05-30

## 2020-07-29 ENCOUNTER — Encounter: Payer: Self-pay | Admitting: Orthopaedic Surgery

## 2020-07-29 ENCOUNTER — Ambulatory Visit (INDEPENDENT_AMBULATORY_CARE_PROVIDER_SITE_OTHER): Payer: Medicare Other | Admitting: Orthopaedic Surgery

## 2020-07-29 VITALS — Ht 61.0 in | Wt 183.0 lb

## 2020-07-29 DIAGNOSIS — Z96652 Presence of left artificial knee joint: Secondary | ICD-10-CM

## 2020-07-29 NOTE — Progress Notes (Signed)
Office Visit Note   Patient: [name]           Date of Birth: 1933-05-11           MRN: [id number] Visit Date: 07/29/2020              Requested by: Glendale Chard, [address] [place],  [place] [address] PCP: Glendale Chard, MD   Assessment & Plan: Visit Diagnoses:  1. S/P total knee arthroplasty, left     Plan: Patient done well with therapy she will finish her last 2 visits.  She can continue working on prone position get those last couple degrees.  She is walking better and her pain is resolved that she had from her week leg but did not have full extension.  I will recheck her in 5 months.  Patient is very pleased with the excellent progress with therapy.  Follow-Up Instructions: Return in about 5 months (around 12/27/2020).   Orders:  No orders of the defined types were placed in this encounter.  No orders of the defined types were placed in this encounter.     Procedures: No procedures performed   Clinical Data: No additional findings.   Subjective: Chief Complaint  Patient presents with  . Left Knee - Follow-up    02/24/2020 left TKA    HPI 86 year old female returns post total knee arthroplasty in May she had full extension then lost it she has been working hard with therapy and now is within for 5 degrees reaching full extension she is walking better and can walk without her cane she still needs a little bit more quad work still has good flexion and significant pain she had has resolved now that she has got her knee out and better extension.  Review of Systems reviewed updated unchanged.   Objective: Vital Signs: Ht 5\' 1"  (1.549 m)   Wt 183 lb (83 kg)   BMI 34.58 kg/m   Physical Exam Constitutional:      Appearance: She is well-developed.  HENT:     Head: Normocephalic.     Right Ear: External ear normal.     Left Ear: External ear normal.  Eyes:     Pupils: Pupils are equal, round, and reactive to light.  Neck:      Thyroid: No thyromegaly.     Trachea: No tracheal deviation.  Cardiovascular:     Rate and Rhythm: Normal rate.  Pulmonary:     Effort: Pulmonary effort is normal.  Abdominal:     Palpations: Abdomen is soft.  Skin:    General: Skin is warm and dry.  Neurological:     Mental Status: She is alert and oriented to person, place, and time.  Psychiatric:        Behavior: Behavior normal.     Ortho Exam patient flexes 110.  Quad strength is improved and take some hand resistance.  She comes nearly to full extension.  Specialty Comments:  No specialty comments available.  Imaging: No results found.   PMFS History: Patient Active Problem List   Diagnosis Date Noted  . S/P total knee arthroplasty, left 03/09/2020  . History of total hip arthroplasty, right 08/21/2019  . Intermittent claudication ([place]) 07/08/2019  . Hypertensive nephropathy 12/15/2018  . Chronic renal disease, stage II 12/15/2018  . H/O total hip arthroplasty, left 11/07/2017  . Presence of left artificial hip joint 05/14/2017  . Preop cardiovascular exam 03/12/2017  . Cough   . Abdominal  pain   . Symptomatic cholelithiasis 07/01/2016  . Nonspecific ST-T wave electrocardiographic changes   . Lower leg edema 02/16/2015  . Obesity (BMI 30.0-34.9) 02/16/2015  . Essential hypertension 01/11/2012  . Hyperthyroidism 01/11/2012  . Hypercholesteremia 01/11/2012  . H/O vaginal hysterectomy 1974 01/11/2012    Class: History of  . S/P removal of lung  partial R lobe  2010 01/11/2012  . History of hernia repair  R ing. 1975 01/11/2012  . BACK PAIN 11/12/2007  . DYSPNEA 11/11/2007   Past Medical History:  Diagnosis Date  . Aortic stenosis    Echo 07/05/16: Very mild AS, Mean gradient 10 mm Hg, Peak gradient (S) 19 mmHg   . Arthritis    left hip and back  . Bronchiectasis    isolated to RML; status post right middle lobe partial resection.  . Complication of anesthesia    hard to wake up  . Dyspnea   . Gall  stones   . GERD (gastroesophageal reflux disease)    history   . H/O osteoporosis   . Hypertension   . Hyperthyroidism    endocrinologist - Dr. Chalmers Cater  . Pneumonia   . Yeast infection     Family History  Problem Relation Age of Onset  . Hypertension Mother   . CVA Mother 76  . Heart attack Sister   . Diabetes Sister   . Healthy Father   . Heart attack Brother   . Heart attack Brother     Past Surgical History:  Procedure Laterality Date  . ABDOMINAL HYSTERECTOMY  1974  . CHOLECYSTECTOMY N/A 07/02/2016   Procedure: LAPAROSCOPIC CHOLECYSTECTOMY;  Surgeon: Stark Klein, MD;  Location: [facility];  Service: General;  Laterality: N/A;  . COLONOSCOPY    . ENDOSCOPIC RETROGRADE CHOLANGIOPANCREATOGRAPHY (ERCP) WITH PROPOFOL N/A 08/07/2018   Procedure: ENDOSCOPIC RETROGRADE CHOLANGIOPANCREATOGRAPHY (ERCP) WITH PROPOFOL;  Surgeon: Carol Ada, MD;  Location: [facility];  Service: Endoscopy;  Laterality: N/A;  . EYE SURGERY     stye removed  . HARDWARE REMOVAL Left 02/24/2020   Procedure: Hardware Removal, left knee;  Surgeon: [name], MD;  Location: [facility];  Service: Orthopedics;  Laterality: Left;  . HERNIA REPAIR  1975  . KNEE SURGERY Left   . LUNG REMOVAL, PARTIAL  208   RML  . NM MYOVIEW LTD  06/2011   Normal EF. No ischemia or infarction.  [name]  08/07/2018   Procedure: [name];  Surgeon: Carol Ada, MD;  Location: [facility];  Service: Endoscopy;;  balloon sweep  . STERIOD INJECTION Right 02/24/2020   Procedure: RIGHT KNEE STEROID INTRA-ARTICULAR MARCAINE/DEPO-MEDROL INJECTION UNDER ANESTHESIA;  Surgeon: [name], MD;  Location: [facility];  Service: Orthopedics;  Laterality: Right;  . TOTAL HIP ARTHROPLASTY Left 04/05/2017   Procedure: LEFT TOTAL HIP ARTHROPLASTY ANTERIOR APPROACH;  Surgeon: [name], MD;  Location: [facility];  Service: Orthopedics;  Laterality: Left;  . TOTAL HIP ARTHROPLASTY Right 12/19/2018  . TOTAL HIP ARTHROPLASTY Right 12/19/2018     Procedure: RIGHT TOTAL HIP ARTHROPLASTY-DIRECT ANTERIOR APPROACH;  Surgeon: [name], MD;  Location: [facility];  Service: Orthopedics;  Laterality: Right;  . TOTAL KNEE ARTHROPLASTY Left 02/24/2020   Procedure: LEFT TOTAL KNEE ARTHROPLASTY;  Surgeon: [name], MD;  Location: [facility];  Service: Orthopedics;  Laterality: Left;  . TRANSTHORACIC ECHOCARDIOGRAM  06/2011   Mild concentric LVH. Normal EF with impaired relaxation. Mildly elevated RV pressures of 30 and 40 mmHg.  Marland Kitchen TRANSTHORACIC ECHOCARDIOGRAM  06/2016  normal LV size and function. EF 60-65% with GRD 2 DD. Mild aortic stenosis. Mild LA dilation. Mild to moderately increased PA pressures (46 mmHg).  . TUBAL LIGATION     Social History   Occupational History  . Occupation: retired  Tobacco Use  . Smoking status: Never Smoker  . Smokeless tobacco: Never Used  Vaping Use  . Vaping Use: Never used  Substance and Sexual Activity  . Alcohol use: No  . Drug use: No  . Sexual activity: Not Currently

## 2020-08-02 ENCOUNTER — Other Ambulatory Visit: Payer: Self-pay

## 2020-08-02 ENCOUNTER — Ambulatory Visit (INDEPENDENT_AMBULATORY_CARE_PROVIDER_SITE_OTHER): Payer: Medicare Other | Admitting: Physical Therapy

## 2020-08-02 ENCOUNTER — Encounter: Payer: Self-pay | Admitting: Physical Therapy

## 2020-08-02 DIAGNOSIS — M25662 Stiffness of left knee, not elsewhere classified: Secondary | ICD-10-CM

## 2020-08-02 DIAGNOSIS — G8929 Other chronic pain: Secondary | ICD-10-CM | POA: Diagnosis not present

## 2020-08-02 DIAGNOSIS — R600 Localized edema: Secondary | ICD-10-CM

## 2020-08-02 DIAGNOSIS — M25562 Pain in left knee: Secondary | ICD-10-CM

## 2020-08-02 DIAGNOSIS — M6281 Muscle weakness (generalized): Secondary | ICD-10-CM | POA: Diagnosis not present

## 2020-08-02 DIAGNOSIS — R262 Difficulty in walking, not elsewhere classified: Secondary | ICD-10-CM | POA: Diagnosis not present

## 2020-08-02 NOTE — Therapy (Signed)
[facility] [place], [place], [address] Phone: [phone]   Fax:  [phone]  Physical Therapy Treatment  Patient Details  Name: [name] MRN: [id number] Date of Birth: [birth year] Referring Provider (PT): Dr. Lorin Mercy   Encounter Date: 08/02/2020   PT End of Session - 08/02/20 1015    Visit Number 22    Number of Visits 24    Date for PT Re-Evaluation 08/16/20    Authorization - Visit Number 21    Progress Note Due on Visit 25    PT Start Time 0933    PT Stop Time 1014    PT Time Calculation (min) 41 min    Activity Tolerance Patient tolerated treatment well    Behavior During Therapy [facility] for tasks assessed/performed           Past Medical History:  Diagnosis Date  . Aortic stenosis    Echo 07/05/16: Very mild AS, Mean gradient 10 mm Hg, Peak gradient (S) 19 mmHg   . Arthritis    left hip and back  . Bronchiectasis    isolated to RML; status post right middle lobe partial resection.  . Complication of anesthesia    hard to wake up  . Dyspnea   . Gall stones   . GERD (gastroesophageal reflux disease)    history   . H/O osteoporosis   . Hypertension   . Hyperthyroidism    endocrinologist - Dr. Chalmers Cater  . Pneumonia   . Yeast infection     Past Surgical History:  Procedure Laterality Date  . ABDOMINAL HYSTERECTOMY  1974  . CHOLECYSTECTOMY N/A 07/02/2016   Procedure: LAPAROSCOPIC CHOLECYSTECTOMY;  Surgeon: Stark Klein, MD;  Location: [facility];  Service: General;  Laterality: N/A;  . COLONOSCOPY    . ENDOSCOPIC RETROGRADE CHOLANGIOPANCREATOGRAPHY (ERCP) WITH PROPOFOL N/A 08/07/2018   Procedure: ENDOSCOPIC RETROGRADE CHOLANGIOPANCREATOGRAPHY (ERCP) WITH PROPOFOL;  Surgeon: Carol Ada, MD;  Location: [facility];  Service: Endoscopy;  Laterality: N/A;  . EYE SURGERY     stye removed  . HARDWARE REMOVAL Left 02/24/2020   Procedure: Hardware Removal, left knee;  Surgeon: [name], MD;  Location: [facility];   Service: Orthopedics;  Laterality: Left;  . HERNIA REPAIR  1975  . KNEE SURGERY Left   . LUNG REMOVAL, PARTIAL  208   RML  . NM MYOVIEW LTD  06/2011   Normal EF. No ischemia or infarction.  [name]  08/07/2018   Procedure: [name];  Surgeon: Carol Ada, MD;  Location: [facility];  Service: Endoscopy;;  balloon sweep  . STERIOD INJECTION Right 02/24/2020   Procedure: RIGHT KNEE STEROID INTRA-ARTICULAR MARCAINE/DEPO-MEDROL INJECTION UNDER ANESTHESIA;  Surgeon: [name], MD;  Location: [facility];  Service: Orthopedics;  Laterality: Right;  . TOTAL HIP ARTHROPLASTY Left 04/05/2017   Procedure: LEFT TOTAL HIP ARTHROPLASTY ANTERIOR APPROACH;  Surgeon: [name], MD;  Location: [facility];  Service: Orthopedics;  Laterality: Left;  . TOTAL HIP ARTHROPLASTY Right 12/19/2018  . TOTAL HIP ARTHROPLASTY Right 12/19/2018   Procedure: RIGHT TOTAL HIP ARTHROPLASTY-DIRECT ANTERIOR APPROACH;  Surgeon: [name], MD;  Location: [facility];  Service: Orthopedics;  Laterality: Right;  . TOTAL KNEE ARTHROPLASTY Left 02/24/2020   Procedure: LEFT TOTAL KNEE ARTHROPLASTY;  Surgeon: [name], MD;  Location: [facility];  Service: Orthopedics;  Laterality: Left;  . TRANSTHORACIC ECHOCARDIOGRAM  06/2011   Mild concentric LVH. Normal EF with impaired relaxation. Mildly elevated RV pressures of 30 and 40 mmHg.  Marland Kitchen  TRANSTHORACIC ECHOCARDIOGRAM  06/2016   normal LV size and function. EF 60-65% with GRD 2 DD. Mild aortic stenosis. Mild LA dilation. Mild to moderately increased PA pressures (46 mmHg).  . TUBAL LIGATION      There were no vitals filed for this visit.   Subjective Assessment - 08/02/20 0937    Subjective MD recommended she finish last 2 PT sessions then she can graduate; no pain just heavy    Limitations Walking;Standing;House hold activities    Patient Stated Goals Reduce pain.  Wants to walk for exercise, ride a bike.    Currently in Pain? No/denies              [facility] PT  Assessment - 08/02/20 1010      Assessment   Medical Diagnosis S/P L TKA    Referring Provider (PT) Dr. Lorin Mercy      AROM   Left Knee Extension 0    Left Knee Flexion 111                         [facility] Adult PT Treatment/Exercise - 08/02/20 0936      Knee/Hip Exercises: Stretches   Passive Hamstring Stretch Left;3 reps;30 seconds    Passive Hamstring Stretch Limitations overpressure at distal thigh; sitting and supine      Knee/Hip Exercises: Aerobic   Recumbent Bike Seat 6 full revolutions 7 mins lvl 2      Knee/Hip Exercises: Machines for Strengthening   Cybex Knee Extension SL 5 lbs 3 x 10 Lt LE    Cybex Knee Flexion SL 15 lbs 3 x 10 Lt LE      Knee/Hip Exercises: Seated   Sit to Sand without UE support;10 reps                       PT Long Term Goals - 07/20/20 1641      PT LONG TERM GOAL #1   Title Patient will demonstrate/report pain at worst less than or equal to 2/10 to facilitate minimal limitation in daily activity secondary to pain symptoms.    Time 8    Period Weeks    Status On-going      PT LONG TERM GOAL #2   Title Patient will demonstrate independent use of home exercise program to facilitate ability to maintain/progress functional gains from skilled physical therapy services.    Time 6    Period Weeks    Status On-going      PT LONG TERM GOAL #3   Title Patient will demonstrate independent ambulation community distances > 300 ft to facilitate community integration at [facility].    Baseline Needs cane    Time 6    Period Weeks    Status On-going      PT LONG TERM GOAL #4   Title demonstrate 0 degrees Lt knee extension to facilitate improved gait mechanics and improved quad activation    Time 6    Period Weeks    Status Achieved      PT LONG TERM GOAL #5   Title demonstrate 5/5 Lt knee extension strength without pain for improved TKE with ambulation and functional activities    Time 6    Period Weeks    Status On-going                  Plan - 08/02/20 1016    Clinical Impression Statement Pt tolerated session well today with  good ROM and anticipate d/c next visit.  Will plan to wrap up any final concerns next visit.    Personal Factors and Comorbidities Comorbidity 3+    Comorbidities bilateral THA, most recent 08/2019.  Chronic renal disease, HTN, obesity    Examination-Activity Limitations Transfers;Locomotion Level;Squat;Stairs;Stand;Sit;Bed Mobility;Bend;Carry    Examination-Participation Restrictions Community Activity    Stability/Clinical Decision Making Evolving/Moderate complexity    Rehab Potential Good    PT Frequency 2x / week   1-2x/wk   PT Duration 4 weeks    PT Treatment/Interventions ADLs/Self Care Home Management;Electrical Stimulation;Iontophoresis 4mg /ml Dexamethasone;Moist Heat;Traction;Balance training;Therapeutic exercise;Therapeutic activities;Functional mobility training;Stair training;Gait training;Ultrasound;Neuromuscular re-education;Patient/family education;Wheelchair mobility training;Manual techniques;Manual lymph drainage;Vasopneumatic Device;Taping;Dry needling;Passive range of motion;Spinal Manipulations;Joint Manipulations    PT Next Visit Plan Encheck remaining goals, d/c next visit    PT Home Exercise Plan Access Code: [email or web address].    Consulted and Agree with Plan of Care Patient           Patient will benefit from skilled therapeutic intervention in order to improve the following deficits and impairments:  Abnormal gait, Decreased endurance, Hypomobility, Increased edema, Decreased activity tolerance, Decreased strength, Pain, Difficulty walking, Decreased mobility, Decreased balance, Decreased range of motion, Impaired perceived functional ability, Impaired flexibility, Decreased coordination  Visit Diagnosis: Chronic pain of left knee  Stiffness of left knee, not elsewhere classified  Difficulty in walking, not elsewhere classified  Muscle weakness  (generalized)  Localized edema     Problem List Patient Active Problem List   Diagnosis Date Noted  . S/P total knee arthroplasty, left 03/09/2020  . History of total hip arthroplasty, right 08/21/2019  . Intermittent claudication ([place]) 07/08/2019  . Hypertensive nephropathy 12/15/2018  . Chronic renal disease, stage II 12/15/2018  . H/O total hip arthroplasty, left 11/07/2017  . Presence of left artificial hip joint 05/14/2017  . Preop cardiovascular exam 03/12/2017  . Cough   . Abdominal pain   . Symptomatic cholelithiasis 07/01/2016  . Nonspecific ST-T wave electrocardiographic changes   . Lower leg edema 02/16/2015  . Obesity (BMI 30.0-34.9) 02/16/2015  . Essential hypertension 01/11/2012  . Hyperthyroidism 01/11/2012  . Hypercholesteremia 01/11/2012  . H/O vaginal hysterectomy 1974 01/11/2012    Class: History of  . S/P removal of lung  partial R lobe  2010 01/11/2012  . History of hernia repair  R ing. 1975 01/11/2012  . BACK PAIN 11/12/2007  . DYSPNEA 11/11/2007      Laureen Abrahams, PT, DPT 08/02/20 11:34 AM     [facility] [address] [place], [place], [address] Phone: [phone]   Fax:  [phone]  Name: [name] MRN: [id number] Date of Birth: 1933-05-10

## 2020-08-10 ENCOUNTER — Encounter: Payer: Self-pay | Admitting: Physical Therapy

## 2020-08-10 ENCOUNTER — Ambulatory Visit (INDEPENDENT_AMBULATORY_CARE_PROVIDER_SITE_OTHER): Payer: Medicare Other | Admitting: Physical Therapy

## 2020-08-10 ENCOUNTER — Other Ambulatory Visit: Payer: Self-pay

## 2020-08-10 DIAGNOSIS — R600 Localized edema: Secondary | ICD-10-CM | POA: Diagnosis not present

## 2020-08-10 DIAGNOSIS — G8929 Other chronic pain: Secondary | ICD-10-CM

## 2020-08-10 DIAGNOSIS — R262 Difficulty in walking, not elsewhere classified: Secondary | ICD-10-CM

## 2020-08-10 DIAGNOSIS — M6281 Muscle weakness (generalized): Secondary | ICD-10-CM

## 2020-08-10 DIAGNOSIS — M25662 Stiffness of left knee, not elsewhere classified: Secondary | ICD-10-CM | POA: Diagnosis not present

## 2020-08-10 DIAGNOSIS — M25562 Pain in left knee: Secondary | ICD-10-CM

## 2020-08-10 NOTE — Therapy (Signed)
[place], [place], [address] Phone: [phone]   Fax:  [phone]  Physical Therapy Treatment/Discharge Summary  Patient Details  Name: LAYLYNN CAMPANELLA MRN: [id number] Date of Birth: 08-01-1933 Referring Provider (PT): Dr. Lorin Mercy   Encounter Date: 08/10/2020   PT End of Session - 08/10/20 1048    Visit Number 23    Number of Visits 24    Date for PT Re-Evaluation 08/16/20    PT Start Time 1020    PT Stop Time 1045    PT Time Calculation (min) 25 min    Activity Tolerance Patient tolerated treatment well    Behavior During Therapy [facility] for tasks assessed/performed           Past Medical History:  Diagnosis Date  . Aortic stenosis    Echo 07/05/16: Very mild AS, Mean gradient 10 mm Hg, Peak gradient (S) 19 mmHg   . Arthritis    left hip and back  . Bronchiectasis    isolated to RML; status post right middle lobe partial resection.  . Complication of anesthesia    hard to wake up  . Dyspnea   . Gall stones   . GERD (gastroesophageal reflux disease)    history   . H/O osteoporosis   . Hypertension   . Hyperthyroidism    endocrinologist - Dr. Chalmers Cater  . Pneumonia   . Yeast infection     Past Surgical History:  Procedure Laterality Date  . ABDOMINAL HYSTERECTOMY  1974  . CHOLECYSTECTOMY N/A 07/02/2016   Procedure: LAPAROSCOPIC CHOLECYSTECTOMY;  Surgeon: Stark Klein, MD;  Location: [place];  Service: General;  Laterality: N/A;  . COLONOSCOPY    . ENDOSCOPIC RETROGRADE CHOLANGIOPANCREATOGRAPHY (ERCP) WITH PROPOFOL N/A 08/07/2018   Procedure: ENDOSCOPIC RETROGRADE CHOLANGIOPANCREATOGRAPHY (ERCP) WITH PROPOFOL;  Surgeon: Carol Ada, MD;  [facility];  Service: Endoscopy;  Laterality: N/A;  . EYE SURGERY     stye removed  . HARDWARE REMOVAL Left 02/24/2020   Procedure: Hardware Removal, left knee;  Surgeon: [name], MD;  Location: [place];  Service: Orthopedics;  Laterality: Left;  . HERNIA  REPAIR  1975  . KNEE SURGERY Left   . LUNG REMOVAL, PARTIAL  208   RML  . NM MYOVIEW LTD  06/2011   Normal EF. No ischemia or infarction.  [name]  08/07/2018   Procedure: [name];  Surgeon: Carol Ada, MD;  [facility];  Service: Endoscopy;;  balloon sweep  . STERIOD INJECTION Right 02/24/2020   Procedure: RIGHT KNEE STEROID INTRA-ARTICULAR MARCAINE/DEPO-MEDROL INJECTION UNDER ANESTHESIA;  Surgeon: [name], MD;  Location: [place];  Service: Orthopedics;  Laterality: Right;  . TOTAL HIP ARTHROPLASTY Left 04/05/2017   Procedure: LEFT TOTAL HIP ARTHROPLASTY ANTERIOR APPROACH;  Surgeon: [name], MD;  Location: [place];  Service: Orthopedics;  Laterality: Left;  . TOTAL HIP ARTHROPLASTY Right 12/19/2018  . TOTAL HIP ARTHROPLASTY Right 12/19/2018   Procedure: RIGHT TOTAL HIP ARTHROPLASTY-DIRECT ANTERIOR APPROACH;  Surgeon: [name], MD;  Location: [place];  Service: Orthopedics;  Laterality: Right;  . TOTAL KNEE ARTHROPLASTY Left 02/24/2020   Procedure: LEFT TOTAL KNEE ARTHROPLASTY;  Surgeon: [name], MD;  Location: [place];  Service: Orthopedics;  Laterality: Left;  . TRANSTHORACIC ECHOCARDIOGRAM  06/2011   Mild concentric LVH. Normal EF with impaired relaxation. Mildly elevated RV pressures of 30 and 40 mmHg.  Marland Kitchen TRANSTHORACIC ECHOCARDIOGRAM  06/2016   normal LV size and function. EF 60-65% with  GRD 2 DD. Mild aortic stenosis. Mild LA dilation. Mild to moderately increased PA pressures (46 mmHg).  . TUBAL LIGATION      There were no vitals filed for this visit.   Subjective Assessment - 08/10/20 1020    Subjective doing well today    Limitations Walking;Standing;House hold activities    Patient Stated Goals Reduce pain.  Wants to walk for exercise, ride a bike.    Currently in Pain? No/denies              [facility] PT Assessment - 08/10/20 1031      Assessment   Medical Diagnosis S/P L TKA    Referring Provider (PT) Dr. Lorin Mercy      AROM    Left Knee Extension 0    Left Knee Flexion 117      Strength   Left Knee Flexion 5/5    Left Knee Extension 5/5      Ambulation/Gait   Ambulation/Gait Yes    Ambulation/Gait Assistance 7: Independent    Ambulation Distance (Feet) 360 Feet    Assistive device None    Ambulation Surface Level;Indoor                         [facility] Adult PT Treatment/Exercise - 08/10/20 0001      Knee/Hip Exercises: Aerobic   Recumbent Bike Seat 6: partial to full revolutions x 8 min      Knee/Hip Exercises: Supine   Heel Slides AAROM;10 reps                       PT Long Term Goals - 08/10/20 1049      PT LONG TERM GOAL #1   Title Patient will demonstrate/report pain at worst less than or equal to 2/10 to facilitate minimal limitation in daily activity secondary to pain symptoms.    Baseline 0-4/10    Time 8    Period Weeks    Status Partially Met      PT LONG TERM GOAL #2   Title Patient will demonstrate independent use of home exercise program to facilitate ability to maintain/progress functional gains from skilled physical therapy services.    Time 6    Period Weeks    Status Achieved      PT LONG TERM GOAL #3   Title Patient will demonstrate independent ambulation community distances > 300 ft to facilitate community integration at [facility].    Baseline Needs cane    Time 6    Period Weeks    Status Achieved      PT LONG TERM GOAL #4   Title demonstrate 0 degrees Lt knee extension to facilitate improved gait mechanics and improved quad activation    Time 6    Period Weeks    Status Achieved      PT LONG TERM GOAL #5   Title demonstrate 5/5 Lt knee extension strength without pain for improved TKE with ambulation and functional activities    Time 6    Period Weeks    Status Achieved                 Plan - 08/10/20 1051    Clinical Impression Statement Pt has met/partially met all goals and is ready for d/c at this time.    Personal Factors and  Comorbidities Comorbidity 3+    Comorbidities bilateral THA, most recent 08/2019.  Chronic renal disease, HTN, obesity  Examination-Activity Limitations Transfers;Locomotion Level;Squat;Stairs;Stand;Sit;Bed Mobility;Bend;Carry    Examination-Participation Restrictions Community Activity    Stability/Clinical Decision Making Evolving/Moderate complexity    Rehab Potential Good    PT Frequency 2x / week   1-2x/wk   PT Duration 4 weeks    PT Treatment/Interventions ADLs/Self Care Home Management;Electrical Stimulation;Iontophoresis 52m/ml Dexamethasone;Moist Heat;Traction;Balance training;Therapeutic exercise;Therapeutic activities;Functional mobility training;Stair training;Gait training;Ultrasound;Neuromuscular re-education;Patient/family education;Wheelchair mobility training;Manual techniques;Manual lymph drainage;Vasopneumatic Device;Taping;Dry needling;Passive range of motion;Spinal Manipulations;Joint Manipulations    PT Next Visit Plan d/c PT today    PT Home Exercise Plan Access Code: [email or web address].    Consulted and Agree with Plan of Care Patient           Patient will benefit from skilled therapeutic intervention in order to improve the following deficits and impairments:  Abnormal gait, Decreased endurance, Hypomobility, Increased edema, Decreased activity tolerance, Decreased strength, Pain, Difficulty walking, Decreased mobility, Decreased balance, Decreased range of motion, Impaired perceived functional ability, Impaired flexibility, Decreased coordination  Visit Diagnosis: Chronic pain of left knee  Stiffness of left knee, not elsewhere classified  Difficulty in walking, not elsewhere classified  Muscle weakness (generalized)  Localized edema     Problem List Patient Active Problem List   Diagnosis Date Noted  . S/P total knee arthroplasty, left 03/09/2020  . History of total hip arthroplasty, right 08/21/2019  . In[place]ent claudication ([place] 07/08/[place]  .  Hypertensive nephropathy 12/15/2018  . Chronic renal disease, stage II 12/15/2018  . H/O total hip arthroplasty, left 11/07/2017  . Presence of left artificial hip joint 05/14/2017  . Preop cardiovascular exam 03/12/2017  . Cough   . Abdominal pain   . Symptomatic cholelithiasis 07/01/2016  . Nonspecific ST-T wave electrocardiographic changes   . Lower leg edema 02/16/2015  . Obesity (BMI 30.0-34.9) 02/16/2015  . Essential hypertension 01/11/2012  . Hyperthyroidism 01/11/2012  . Hypercholesteremia 01/11/2012  . H/O vaginal hysterectomy 1974 01/11/2012    Class: History of  . S/P removal of lung  partial R lobe  2010 01/11/2012  . History of hernia repair  R ing. 1975 01/11/2012  . BACK PAIN 11/12/2007  . DYSPNEA 11/11/2007      SLaureen Abrahams PT, DPT 08/10/20 10:53 AM    [facility] [address] [address]Phone: [phone]  Fax:  [phone] Name: MKAMAURI KATHOLMRN: [id number] of Birth: [birth year]     PHYSICAL THERAPY DISCHARGE SUMMARY  Visits from Start of Care: 23  Current functional level related to goals / functional outcomes: See above   Remaining deficits: See above   Education / Equipment: HEP  Plan: Patient agrees to discharge.  Patient goals were partially met. Patient is being discharged due to meeting the stated rehab goals.  ?????    SLaureen Abrahams PT, DPT 08/10/20 10:54 AM  [facility] HospitalPhysical Therapy [address] [address]Phone: [phone]  Fax:  [phone]

## 2020-10-21 DIAGNOSIS — H40033 Anatomical narrow angle, bilateral: Secondary | ICD-10-CM | POA: Diagnosis not present

## 2020-10-21 DIAGNOSIS — H40013 Open angle with borderline findings, low risk, bilateral: Secondary | ICD-10-CM | POA: Diagnosis not present

## 2020-10-21 DIAGNOSIS — H25013 Cortical age-related cataract, bilateral: Secondary | ICD-10-CM | POA: Diagnosis not present

## 2020-10-21 DIAGNOSIS — H2513 Age-related nuclear cataract, bilateral: Secondary | ICD-10-CM | POA: Diagnosis not present

## 2020-11-11 DIAGNOSIS — E0590 Thyrotoxicosis, unspecified without thyrotoxic crisis or storm: Secondary | ICD-10-CM | POA: Diagnosis not present

## 2020-11-11 LAB — HEPATIC FUNCTION PANEL
ALT: 13 (ref 7–35)
AST: 17 (ref 13–35)
Alkaline Phosphatase: 52 (ref 25–125)
Bilirubin, Total: 0.5

## 2020-11-11 LAB — COMPREHENSIVE METABOLIC PANEL: Albumin: 3.5 (ref 3.5–5.0)

## 2020-11-11 LAB — CBC: RBC: 4.69 (ref 3.87–5.11)

## 2020-11-11 LAB — CBC AND DIFFERENTIAL
HCT: 36 (ref 36–46)
Hemoglobin: 12.6 (ref 12.0–16.0)
Neutrophils Absolute: 39
Platelets: 396 (ref 150–399)
WBC: 5.8

## 2020-11-11 LAB — TSH: TSH: 0.55 (ref 0.41–5.90)

## 2020-11-18 DIAGNOSIS — I10 Essential (primary) hypertension: Secondary | ICD-10-CM | POA: Diagnosis not present

## 2020-11-18 DIAGNOSIS — E0590 Thyrotoxicosis, unspecified without thyrotoxic crisis or storm: Secondary | ICD-10-CM | POA: Diagnosis not present

## 2020-11-21 ENCOUNTER — Ambulatory Visit (INDEPENDENT_AMBULATORY_CARE_PROVIDER_SITE_OTHER): Payer: Medicare Other | Admitting: Cardiology

## 2020-11-21 ENCOUNTER — Encounter: Payer: Self-pay | Admitting: Cardiology

## 2020-11-21 ENCOUNTER — Other Ambulatory Visit: Payer: Self-pay

## 2020-11-21 VITALS — BP 116/70 | HR 60 | Ht 61.0 in | Wt 188.0 lb

## 2020-11-21 DIAGNOSIS — I10 Essential (primary) hypertension: Secondary | ICD-10-CM | POA: Diagnosis not present

## 2020-11-21 DIAGNOSIS — I5032 Chronic diastolic (congestive) heart failure: Secondary | ICD-10-CM | POA: Diagnosis not present

## 2020-11-21 NOTE — Progress Notes (Signed)
Primary Care Provider: Glendale Chard, MD Cardiologist: Glenetta Hew, MD Electrophysiologist: None  Clinic Note: Chief Complaint  Patient presents with  . Follow-up    Hypertension stable   ===================================  ASSESSMENT/PLAN   Problem List Items Addressed This Visit    Diastolic CHF, chronic (HCC) (Chronic)    I do not know how much of her dyspnea is truly related to diastolic heart failure versus obesity and deconditioning.  She really does not have any PND orthopnea with trivial edema.  Class I CHF type symptoms.  Is only on Toprol for blood pressure control.  On minimal 20 mg Lasix daily with relatively well-controlled edema.      Relevant Orders   EKG 12-Lead (Completed)   Essential hypertension - Primary (Chronic)    Blood pressure well controlled on current dose of Toprol-XL.      Relevant Orders   EKG 12-Lead (Completed)     ===================================  HPI:    [name] is a moderately obese 87 y.o. female with a PMH notable for HTN and associated Diastolic Dysfunction, OSA on CPAP,s/p partial RML lobectomy for PNA (in 2008)-chronic MSK chest pain who presents today for annual follow-up.  I have not seen [name] since June 2018.  At that time was an annual follow-up.  She had an initial evaluation back in March 2015 for chest pain that was fully musculoskeletal in nature.  She was evaluated with an echocardiogram and Myoview back in 2012 that were negative for ischemia and relatively normal.  The June 2018 visit was a preop assessment.  She just had an echocardiogram in September 2017 that showed normal EF and grade 2 diastolic dysfunction and mild aortic stenosis.  Mildly elevated PAP.  She was supposed to have hip surgery, leading up to hip surgery she was not having any significant symptoms of exertional dyspnea or chest pain. => Felt to be relatively low risk for surgery -left hip replacement -> She was then seen in October 2019  by Jory Sims, NP for preop assessment for right hip surgery. => Similarly considered low risk. => Unfortunately, the surgery did not happen.  She was then seen by Dr. Claiborne Billings in March 2020 for preop evaluation.  Apparently her pulses were in the 40s.  [name] was last seen on on November 18, 2019 by Jory Sims, NP.  She was now having trouble with both knees having had her hips replaced.  No chest pain.  Only dyspnea with climbing up stairs.  Evidence of postnasal drip.  Treated with OTC and histamine.  => She then had total left knee arthroplasty in May 2021.  Recent Hospitalizations: *None since knee surgery in May 2021  Reviewed  CV studies:    The following studies were reviewed today: (if available, images/films reviewed: From Epic Chart or Care Everywhere) . No new studies:   Interval History:   [name] is here today overall stable from a cardiac standpoint.  She is still bothered by some chronic left knee pain and stiffness, but the hips feel better.  She had been doing well but has gotten off the bandwagon with walking.  She is put on about 5 pounds.  She has exertional dyspnea because of deconditioning and joint pain, but not unless she is going up stairs.  No chest pain or pressure.  Minimal rare palpitations.  CV Review of Symptoms (Summary): positive for - dyspnea on exertion, edema and Minimal end of day edema.  Rare palpitations. negative  for - chest pain, orthopnea, paroxysmal nocturnal dyspnea, rapid heart rate, shortness of breath or Lightheadedness or dizziness, syncope/near syncope or TIA/amaurosis fugax, claudication  The patient does have symptoms concerning for COVID-19 infection (fever, chills, cough, or new shortness of breath).   REVIEWED OF SYSTEMS   Review of Systems  Constitutional: Positive for malaise/fatigue (Does not have a lot of energy.  Does not really have much desire to do things). Negative for weight loss (Actually gaining  weight).  HENT: Negative for congestion and sinus pain.   Respiratory: Positive for shortness of breath (With moderate exertion).   Gastrointestinal: Negative for abdominal pain, blood in stool and melena.  Genitourinary: Negative for hematuria.  Musculoskeletal: Positive for joint pain (Left knee still hurts).  Neurological: Positive for focal weakness (Left leg is a little weak still because of the knee). Negative for dizziness, weakness and headaches.  Psychiatric/Behavioral: Negative for memory loss. The patient is not nervous/anxious and does not have insomnia.     I have reviewed and (if needed) personally updated the patient's problem list, medications, allergies, past medical and surgical history, social and family history.   PAST MEDICAL HISTORY   Past Medical History:  Diagnosis Date  . Aortic stenosis    Echo 07/05/16: Very mild AS, Mean gradient 10 mm Hg, Peak gradient (S) 19 mmHg   . Arthritis    left hip and back  . Bronchiectasis    isolated to RML; status post right middle lobe partial resection.  . Complication of anesthesia    hard to wake up  . Dyspnea   . Gall stones   . GERD (gastroesophageal reflux disease)    history   . H/O osteoporosis   . Hypertension   . Hyperthyroidism    endocrinologist - Dr. Chalmers Cater  . Pneumonia   . Yeast infection     PAST SURGICAL HISTORY   Past Surgical History:  Procedure Laterality Date  . ABDOMINAL HYSTERECTOMY  1974  . CHOLECYSTECTOMY N/A 07/02/2016   Procedure: LAPAROSCOPIC CHOLECYSTECTOMY;  Surgeon: Stark Klein, MD;  Location: [place];  Service: General;  Laterality: N/A;  . COLONOSCOPY    . ENDOSCOPIC RETROGRADE CHOLANGIOPANCREATOGRAPHY (ERCP) WITH PROPOFOL N/A 08/07/2018   Procedure: ENDOSCOPIC RETROGRADE CHOLANGIOPANCREATOGRAPHY (ERCP) WITH PROPOFOL;  Surgeon: Carol Ada, MD;  [facility];  Service: Endoscopy;  Laterality: N/A;  . EYE SURGERY     stye removed  . HARDWARE REMOVAL Left 02/24/2020    Procedure: Hardware Removal, left knee;  Surgeon: [name], MD;  Location: [place];  Service: Orthopedics;  Laterality: Left;  . HERNIA REPAIR  1975  . KNEE SURGERY Left   . LUNG REMOVAL, PARTIAL  208   RML  . NM MYOVIEW LTD  06/2011   Normal EF. No ischemia or infarction.  [name]  08/07/2018   Procedure: [name];  Surgeon: Carol Ada, MD;  [facility];  Service: Endoscopy;;  balloon sweep  . STERIOD INJECTION Right 02/24/2020   Procedure: RIGHT KNEE STEROID INTRA-ARTICULAR MARCAINE/DEPO-MEDROL INJECTION UNDER ANESTHESIA;  Surgeon: [name], MD;  Location: [place];  Service: Orthopedics;  Laterality: Right;  . TOTAL HIP ARTHROPLASTY Left 04/05/2017   Procedure: LEFT TOTAL HIP ARTHROPLASTY ANTERIOR APPROACH;  Surgeon: [name], MD;  Location: [place];  Service: Orthopedics;  Laterality: Left;  . TOTAL HIP ARTHROPLASTY Right 12/19/2018  . TOTAL HIP ARTHROPLASTY Right 12/19/2018   Procedure: RIGHT TOTAL HIP ARTHROPLASTY-DIRECT ANTERIOR APPROACH;  Surgeon: [name], MD;  Location: [place];  Service: Orthopedics;  Laterality: Right;  . TOTAL KNEE ARTHROPLASTY Left 02/24/2020   Procedure: LEFT TOTAL KNEE ARTHROPLASTY;  Surgeon: [name], MD;  Location: [place];  Service: Orthopedics;  Laterality: Left;  . TRANSTHORACIC ECHOCARDIOGRAM  06/2011   Mild concentric LVH. Normal EF with impaired relaxation. Mildly elevated RV pressures of 30 and 40 mmHg.  Marland Kitchen TRANSTHORACIC ECHOCARDIOGRAM  06/2016   normal LV size and function. EF 60-65% with GRD 2 DD. Mild aortic stenosis. Mild LA dilation. Mild to moderately increased PA pressures (46 mmHg).  . TUBAL LIGATION      Immunization History  Administered Date(s) Administered  . PFIZER(Purple Top)SARS-COV-2 Vaccination 12/05/2019, 12/30/2019    MEDICATIONS/ALLERGIES   Current Meds  Medication Sig  . busPIRone (BUSPAR) 5 MG tablet Take 1 tablet (5 mg total) by mouth 2 (two) times daily. (Patient taking  differently: Take 5 mg by mouth daily as needed (anxiety).)  . calcium carbonate (OS-CAL) 600 MG TABS tablet Take 600 mg by mouth daily.  . cholecalciferol (VITAMIN D3) 25 MCG (1000 UT) tablet Take 1,000 Units by mouth daily.  . diclofenac Sodium (VOLTAREN) 1 % GEL APPLY 2 GRAMS TOPICALLY FOUR TIMES A DAY  . Flaxseed, Linseed, (FLAXSEED OIL) 1000 MG CAPS Take 1,000 mg by mouth daily.  . furosemide (LASIX) 20 MG tablet TAKE 1 TABLET DAILY  . methimazole (TAPAZOLE) 5 MG tablet Take 2.5 mg by mouth daily.   . metoprolol succinate (TOPROL XL) 25 MG 24 hr tablet Take 1 tablet (25 mg total) by mouth daily.  Marland Kitchen nystatin cream (MYCOSTATIN) APPLY CREAM TO AFFECTED AREA TWICE A DAY AS NEEDED (Patient taking differently: Apply 1 application topically 2 (two) times daily as needed for dry skin.)  . potassium chloride SA (KLOR-CON) 20 MEQ tablet TAKE 1 TABLET DAILY  . [DISCONTINUED] TRICOR 145 MG tablet Take 1 tablet (145 mg total) by mouth daily.    Allergies  Allergen Reactions  . Ibuprofen Other (See Comments)    hallucinations  . Naproxen Sodium Other (See Comments)    hallucinations    SOCIAL HISTORY/FAMILY HISTORY   Reviewed in Epic:  Pertinent findings:  Social History   Tobacco Use  . Smoking status: Never Smoker  . Smokeless tobacco: Never Used  Vaping Use  . Vaping Use: Never used  Substance Use Topics  . Alcohol use: No  . Drug use: No   Social History   Social History Narrative   She is a widowed mother of 37, grandmother for, a great-grandmother 71.   She has been trying to do more exercise than before, but has been injured recently by her back pain.   She never smoked, doesn't drink alcohol.    OBJCTIVE -PE, EKG, labs   Wt Readings from Last 3 Encounters:  12/06/20 187 lb (84.8 kg)  11/21/20 188 lb (85.3 kg)  07/29/20 183 lb (83 kg)    Physical Exam: BP 116/70   Pulse 60   Ht 5\' 1"  (1.549 m)   Wt 188 lb (85.3 kg)   SpO2 99%   BMI 35.52 kg/m  Physical  Exam Vitals reviewed.  Constitutional:      General: She is not in acute distress.    Appearance: Normal appearance. She is obese. She is not ill-appearing, toxic-appearing or diaphoretic.  HENT:     Head: Normocephalic and atraumatic.  Neck:     Vascular: No carotid bruit (Radiated aortic murmur) or JVD.  Cardiovascular:     Rate and Rhythm: Normal rate  and regular rhythm.  No extrasystoles are present.    Chest Wall: PMI is not displaced (Unable to assess).     Pulses: Normal pulses.     Heart sounds: S1 normal and S2 normal. Heart sounds are distant. Murmur heard.  High-pitched harsh crescendo-decrescendo early systolic murmur is present with a grade of 1/6 at the upper right sternal border radiating to the neck. No friction rub. No gallop.   Pulmonary:     Effort: Pulmonary effort is normal. No respiratory distress.     Breath sounds: Normal breath sounds.  Chest:     Chest wall: Tenderness (Costosternal tenderness, chronic) present.  Musculoskeletal:        General: Swelling (Minimal bilateral ankle swelling) present. Normal range of motion.     Cervical back: Normal range of motion and neck supple.  Skin:    General: Skin is warm and dry.  Neurological:     General: No focal deficit present.     Mental Status: She is alert and oriented to person, place, and time.     Gait: Gait abnormal (Slow antalgic gait; walker).  Psychiatric:        Mood and Affect: Mood normal.        Behavior: Behavior normal.        Thought Content: Thought content normal.        Judgment: Judgment normal.       Adult ECG Report  Rate: 60 ;  Rhythm: normal sinus rhythm, sinus arrhythmia and otherwise normal axis, intervals & durations;   Narrative Interpretation: essentially normal   Recent Labs: Reviewed Lab Results  Component Value Date   CHOL 166 06/28/2020   HDL 52 06/28/2020   LDLCALC 100 (H) 06/28/2020   TRIG 74 06/28/2020   CHOLHDL 3.2 06/28/2020   Lab Results  Component Value  Date   CREATININE 0.78 06/28/2020   BUN 17 06/28/2020   NA 138 06/28/2020   K 4.7 06/28/2020   CL 101 06/28/2020   CO2 26 06/28/2020   CBC Latest Ref Rng & Units 02/26/2020 02/25/2020 02/18/2020  WBC 4.0 - 10.5 K/uL 6.3 9.2 5.4  Hemoglobin 12.0 - 15.0 g/dL 9.3(L) 10.2(L) 13.4  Hematocrit 36.0 - 46.0 % 26.8(L) 29.1(L) 38.7  Platelets 150 - 400 K/uL 264 279 377    Lab Results  Component Value Date   TSH 0.670 07/08/2019   ==================================================  COVID-19 Education: The signs and symptoms of COVID-19 were discussed with the patient and how to seek care for testing (follow up with PCP or arrange E-visit).   The importance of social distancing and COVID-19 vaccination was discussed today. The patient is practicing social distancing & Masking.   I spent a total of 74minutes with the patient spent in direct patient consultation.  Additional time spent with chart review  / charting (studies, outside notes, etc): 14 min Total Time: 26 min  Current medicines are reviewed at length with the patient today.  (+/- concerns) n/a  This visit occurred during the SARS-CoV-2 public health emergency.  Safety protocols were in place, including screening questions prior to the visit, additional usage of staff PPE, and extensive cleaning of exam room while observing appropriate contact time as indicated for disinfecting solutions.  Notice: This dictation was prepared with Dragon dictation along with smaller phrase technology. Any transcriptional errors that result from this process are unintentional and may not be corrected upon review.  Patient Instructions / Medication Changes & Studies & Tests Ordered   Patient  Instructions  Medication Instructions:  NO CHANGES *If you need a refill on your cardiac medications before your next appointment, please call your pharmacy*   Lab Work: NOT NEEDED   Testing/Procedures: NOT NEEDED   Follow-Up: At [facility], you  and your health needs are our priority.  As part of our continuing mission to provide you with exceptional heart care, we have created designated Provider Care Teams.  These Care Teams include your primary Cardiologist (physician) and Advanced Practice Providers (APPs -  Physician Assistants and Nurse Practitioners) who all work together to provide you with the care you need, when you need it.  We recommend signing up for the patient portal called "MyChart".  Sign up information is provided on this After Visit Summary.  MyChart is used to connect with patients for Virtual Visits (Telemedicine).  Patients are able to view lab/test results, encounter notes, upcoming appointments, etc.  Non-urgent messages can be sent to your provider as well.   To learn more about what you can do with MyChart, go to [email or web address].    Your next appointment:   12 month(s)  The format for your next appointment:   In Person  Provider:   Glenetta Hew, MD     Studies Ordered:   Orders Placed This Encounter  Procedures  . EKG 12-Lead     Glenetta Hew, M.D., M.S. Interventional Cardiologist   Pager # [phone] Phone # [phone] [address] [place], [place] [address]   Thank you for choosing Heartcare at [facility]!!

## 2020-11-21 NOTE — Patient Instructions (Signed)

## 2020-11-24 ENCOUNTER — Encounter: Payer: Self-pay | Admitting: Internal Medicine

## 2020-11-24 LAB — HM DIABETES EYE EXAM

## 2020-12-06 ENCOUNTER — Ambulatory Visit (INDEPENDENT_AMBULATORY_CARE_PROVIDER_SITE_OTHER): Payer: Medicare Other | Admitting: Orthopaedic Surgery

## 2020-12-06 ENCOUNTER — Encounter: Payer: Self-pay | Admitting: Orthopaedic Surgery

## 2020-12-06 VITALS — BP 137/77 | HR 66 | Ht 61.0 in | Wt 187.0 lb

## 2020-12-06 DIAGNOSIS — Z96652 Presence of left artificial knee joint: Secondary | ICD-10-CM

## 2020-12-06 DIAGNOSIS — M6281 Muscle weakness (generalized): Secondary | ICD-10-CM | POA: Diagnosis not present

## 2020-12-06 NOTE — Addendum Note (Signed)
Addended by: Meyer Cory on: 12/06/2020 03:23 PM   Modules accepted: Orders

## 2020-12-06 NOTE — Progress Notes (Signed)
Office Visit Note   Patient: [name]           Date of Birth: [birth year]           MRN: [id number] Visit Date: 12/06/2020              Requested by: Glendale Chard, [address] [place],  [place] [address] PCP: Glendale Chard, MD   Assessment & Plan: Visit Diagnoses:  1. S/P total knee arthroplasty, left   2. Quadriceps weakness     Plan: Patient presently needs work on her quad.  After she went home she did exercises on her own for just a week or 2 and then quit.  Since that time she has had increased pain in her legs with difficulty walking by the end of the day due to the residual quad weakness.  She can work on the last few degrees on it of extension as well which we discussed.  I will recheck her again in 2 months.  We will restart formal physical therapy for residual quad weakness and problems with walking.  Follow-Up Instructions: Return in about 2 months (around 02/05/2021).   Orders:  No orders of the defined types were placed in this encounter.  No orders of the defined types were placed in this encounter.     Procedures: No procedures performed   Clinical Data: No additional findings.   Subjective: Chief Complaint  Patient presents with  . Left Knee - Pain, Follow-up    02/24/2020 Left TKA    HPI 87 year old female post left total knee arthroplasty.  Surgery was in May she has 8 degree flexion contracture and is amatory with a cane due to quad weakness and concern on falling.  She used ice and topical and used 2-1/2 pound weight on her ankle but still has quad weakness.  She has hop step to try to get up on the single step.  I can overcome her straight lifted leg with 1 finger.  She not had any pain or swelling in her leg except aching pain at the end of the day.  In standing position she cannot bring her knee out to full extension and lock it.  Review of Systems reviewed and updated unchanged since surgery in May  2021   Objective: Vital Signs: BP 137/77   Pulse 66   Ht 5\' 1"  (1.549 m)   Wt 187 lb (84.8 kg)   BMI 35.33 kg/m   Physical Exam Constitutional:      Appearance: She is well-developed.  HENT:     Head: Normocephalic.     Right Ear: External ear normal.     Left Ear: External ear normal.  Eyes:     Pupils: Pupils are equal, round, and reactive to light.  Neck:     Thyroid: No thyromegaly.     Trachea: No tracheal deviation.  Cardiovascular:     Rate and Rhythm: Normal rate.  Pulmonary:     Effort: Pulmonary effort is normal.  Abdominal:     Palpations: Abdomen is soft.  Skin:    General: Skin is warm and dry.  Neurological:     Mental Status: She is alert and oriented to person, place, and time.  Psychiatric:        Mood and Affect: Mood and affect normal.        Behavior: Behavior normal.     Ortho Exam patient has quad weakness more on the left than right I  can overcome quad the single finger at the ankle.  Trace edema.  She ambulates with her knee in slight flexion short stride gait and can walk in the office without her single-point cane.  Distal pulses are intact negative logroll the hips. Specialty Comments:  No specialty comments available.  Imaging: No results found.   PMFS History: Patient Active Problem List   Diagnosis Date Noted  . Quadriceps weakness 12/06/2020  . S/P total knee arthroplasty, left 03/09/2020  . History of total hip arthroplasty, right 08/21/2019  . Intermittent claudication ([place]) 07/08/2019  . Hypertensive nephropathy 12/15/2018  . Chronic renal disease, stage II 12/15/2018  . H/O total hip arthroplasty, left 11/07/2017  . Presence of left artificial hip joint 05/14/2017  . Preop cardiovascular exam 03/12/2017  . Cough   . Abdominal pain   . Symptomatic cholelithiasis 07/01/2016  . Nonspecific ST-T wave electrocardiographic changes   . Lower leg edema 02/16/2015  . Obesity (BMI 30.0-34.9) 02/16/2015  . Essential hypertension  01/11/2012  . Hyperthyroidism 01/11/2012  . Hypercholesteremia 01/11/2012  . H/O vaginal hysterectomy 1974 01/11/2012    Class: History of  . S/P removal of lung  partial R lobe  2010 01/11/2012  . History of hernia repair  R ing. 1975 01/11/2012  . BACK PAIN 11/12/2007  . DYSPNEA 11/11/2007   Past Medical History:  Diagnosis Date  . Aortic stenosis    Echo 07/05/16: Very mild AS, Mean gradient 10 mm Hg, Peak gradient (S) 19 mmHg   . Arthritis    left hip and back  . Bronchiectasis    isolated to RML; status post right middle lobe partial resection.  . Complication of anesthesia    hard to wake up  . Dyspnea   . Gall stones   . GERD (gastroesophageal reflux disease)    history   . H/O osteoporosis   . Hypertension   . Hyperthyroidism    endocrinologist - Dr. Chalmers Cater  . Pneumonia   . Yeast infection     Family History  Problem Relation Age of Onset  . Hypertension Mother   . CVA Mother 62  . Heart attack Sister   . Diabetes Sister   . Healthy Father   . Heart attack Brother   . Heart attack Brother     Past Surgical History:  Procedure Laterality Date  . ABDOMINAL HYSTERECTOMY  1974  . CHOLECYSTECTOMY N/A 07/02/2016   Procedure: LAPAROSCOPIC CHOLECYSTECTOMY;  Surgeon: Stark Klein, MD;  Location: [facility];  Service: General;  Laterality: N/A;  . COLONOSCOPY    . ENDOSCOPIC RETROGRADE CHOLANGIOPANCREATOGRAPHY (ERCP) WITH PROPOFOL N/A 08/07/2018   Procedure: ENDOSCOPIC RETROGRADE CHOLANGIOPANCREATOGRAPHY (ERCP) WITH PROPOFOL;  Surgeon: Carol Ada, MD;  [facility];  Service: Endoscopy;  Laterality: N/A;  . EYE SURGERY     stye removed  . HARDWARE REMOVAL Left 02/24/2020   Procedure: Hardware Removal, left knee;  Surgeon: [name], MD;  Location: [facility];  Service: Orthopedics;  Laterality: Left;  . HERNIA REPAIR  1975  . KNEE SURGERY Left   . LUNG REMOVAL, PARTIAL  208   RML  . NM MYOVIEW LTD  06/2011   Normal EF. No ischemia or infarction.  [name]  08/07/2018   Procedure: [name];  Surgeon: Carol Ada, MD;  [facility];  Service: Endoscopy;;  balloon sweep  . STERIOD INJECTION Right 02/24/2020   Procedure: RIGHT KNEE STEROID INTRA-ARTICULAR MARCAINE/DEPO-MEDROL INJECTION UNDER ANESTHESIA;  Surgeon: [name], MD;  Location: [facility];  Service: Orthopedics;  Laterality: Right;  . TOTAL HIP ARTHROPLASTY Left 04/05/2017   Procedure: LEFT TOTAL HIP ARTHROPLASTY ANTERIOR APPROACH;  Surgeon: [name], MD;  Location: [facility];  Service: Orthopedics;  Laterality: Left;  . TOTAL HIP ARTHROPLASTY Right 12/19/2018  . TOTAL HIP ARTHROPLASTY Right 12/19/2018   Procedure: RIGHT TOTAL HIP ARTHROPLASTY-DIRECT ANTERIOR APPROACH;  Surgeon: [name], MD;  Location: [facility];  Service: Orthopedics;  Laterality: Right;  . TOTAL KNEE ARTHROPLASTY Left 02/24/2020   Procedure: LEFT TOTAL KNEE ARTHROPLASTY;  Surgeon: [name], MD;  Location: [facility];  Service: Orthopedics;  Laterality: Left;  . TRANSTHORACIC ECHOCARDIOGRAM  06/2011   Mild concentric LVH. Normal EF with impaired relaxation. Mildly elevated RV pressures of 30 and 40 mmHg.  Marland Kitchen TRANSTHORACIC ECHOCARDIOGRAM  06/2016   normal LV size and function. EF 60-65% with GRD 2 DD. Mild aortic stenosis. Mild LA dilation. Mild to moderately increased PA pressures (46 mmHg).  . TUBAL LIGATION     Social History   Occupational History  . Occupation: retired  Tobacco Use  . Smoking status: Never Smoker  . Smokeless tobacco: Never Used  Vaping Use  . Vaping Use: Never used  Substance and Sexual Activity  . Alcohol use: No  . Drug use: No  . Sexual activity: Not Currently

## 2020-12-16 ENCOUNTER — Other Ambulatory Visit: Payer: Self-pay | Admitting: Internal Medicine

## 2020-12-18 ENCOUNTER — Encounter: Payer: Self-pay | Admitting: Cardiology

## 2020-12-18 DIAGNOSIS — I5032 Chronic diastolic (congestive) heart failure: Secondary | ICD-10-CM | POA: Insufficient documentation

## 2020-12-18 NOTE — Assessment & Plan Note (Signed)
Blood pressure well controlled on current dose of Toprol-XL.

## 2020-12-18 NOTE — Assessment & Plan Note (Signed)
I do not know how much of her dyspnea is truly related to diastolic heart failure versus obesity and deconditioning.  She really does not have any PND orthopnea with trivial edema.  Class I CHF type symptoms.  Is only on Toprol for blood pressure control.  On minimal 20 mg Lasix daily with relatively well-controlled edema.

## 2020-12-20 ENCOUNTER — Other Ambulatory Visit: Payer: Self-pay

## 2020-12-20 ENCOUNTER — Encounter: Payer: Self-pay | Admitting: Rehabilitative and Restorative Service Providers"

## 2020-12-20 ENCOUNTER — Ambulatory Visit (INDEPENDENT_AMBULATORY_CARE_PROVIDER_SITE_OTHER): Payer: Medicare Other | Admitting: Rehabilitative and Restorative Service Providers"

## 2020-12-20 DIAGNOSIS — M6281 Muscle weakness (generalized): Secondary | ICD-10-CM

## 2020-12-20 DIAGNOSIS — R262 Difficulty in walking, not elsewhere classified: Secondary | ICD-10-CM

## 2020-12-20 DIAGNOSIS — G8929 Other chronic pain: Secondary | ICD-10-CM | POA: Diagnosis not present

## 2020-12-20 DIAGNOSIS — R600 Localized edema: Secondary | ICD-10-CM

## 2020-12-20 DIAGNOSIS — M25562 Pain in left knee: Secondary | ICD-10-CM

## 2020-12-20 DIAGNOSIS — M25662 Stiffness of left knee, not elsewhere classified: Secondary | ICD-10-CM

## 2020-12-20 NOTE — Therapy (Signed)
[place], [place], [address] Phone: [phone]   Fax:  [phone]  Physical Therapy Evaluation  Patient Details  Name: [name] MRN: [id number] Date of Birth: January 10, 1933 Referring Provider (PT): Dr. Lorin Mercy   Encounter Date: 12/20/2020   PT End of Session - 12/20/20 1102    Visit Number 1    Number of Visits 16    Date for PT Re-Evaluation 02/14/21    Authorization Type Medicare    Progress Note Due on Visit 10    PT Start Time 1105    PT Stop Time 1144    PT Time Calculation (min) 39 min    Activity Tolerance Patient tolerated treatment well    Behavior During Therapy [facility] for tasks assessed/performed           Past Medical History:  Diagnosis Date  . Aortic stenosis    Echo 07/05/16: Very mild AS, Mean gradient 10 mm Hg, Peak gradient (S) 19 mmHg   . Arthritis    left hip and back  . Bronchiectasis    isolated to RML; status post right middle lobe partial resection.  . Complication of anesthesia    hard to wake up  . Dyspnea   . Gall stones   . GERD (gastroesophageal reflux disease)    history   . H/O osteoporosis   . Hypertension   . Hyperthyroidism    endocrinologist - Dr. Chalmers Cater  . Pneumonia   . Yeast infection     Past Surgical History:  Procedure Laterality Date  . ABDOMINAL HYSTERECTOMY  1974  . CHOLECYSTECTOMY N/A 07/02/2016   Procedure: LAPAROSCOPIC CHOLECYSTECTOMY;  Surgeon: Stark Klein, MD;  Location: [facility];  Service: General;  Laterality: N/A;  . COLONOSCOPY    . ENDOSCOPIC RETROGRADE CHOLANGIOPANCREATOGRAPHY (ERCP) WITH PROPOFOL N/A 08/07/2018   Procedure: ENDOSCOPIC RETROGRADE CHOLANGIOPANCREATOGRAPHY (ERCP) WITH PROPOFOL;  Surgeon: Carol Ada, MD;  [facility];  Service: Endoscopy;  Laterality: N/A;  . EYE SURGERY     stye removed  . HARDWARE REMOVAL Left 02/24/2020   Procedure: Hardware Removal, left knee;  Surgeon: [name], MD;  Location: [facility];  Service:  Orthopedics;  Laterality: Left;  . HERNIA REPAIR  1975  . KNEE SURGERY Left   . LUNG REMOVAL, PARTIAL  208   RML  . NM MYOVIEW LTD  06/2011   Normal EF. No ischemia or infarction.  [name]  08/07/2018   Procedure: [name];  Surgeon: Carol Ada, MD;  [facility];  Service: Endoscopy;;  balloon sweep  . STERIOD INJECTION Right 02/24/2020   Procedure: RIGHT KNEE STEROID INTRA-ARTICULAR MARCAINE/DEPO-MEDROL INJECTION UNDER ANESTHESIA;  Surgeon: [name], MD;  Location: [facility];  Service: Orthopedics;  Laterality: Right;  . TOTAL HIP ARTHROPLASTY Left 04/05/2017   Procedure: LEFT TOTAL HIP ARTHROPLASTY ANTERIOR APPROACH;  Surgeon: [name], MD;  Location: [facility];  Service: Orthopedics;  Laterality: Left;  . TOTAL HIP ARTHROPLASTY Right 12/19/2018  . TOTAL HIP ARTHROPLASTY Right 12/19/2018   Procedure: RIGHT TOTAL HIP ARTHROPLASTY-DIRECT ANTERIOR APPROACH;  Surgeon: [name], MD;  Location: [facility];  Service: Orthopedics;  Laterality: Right;  . TOTAL KNEE ARTHROPLASTY Left 02/24/2020   Procedure: LEFT TOTAL KNEE ARTHROPLASTY;  Surgeon: [name], MD;  Location: [facility];  Service: Orthopedics;  Laterality: Left;  . TRANSTHORACIC ECHOCARDIOGRAM  06/2011   Mild concentric LVH. Normal EF with impaired relaxation. Mildly elevated RV pressures of 30 and 40 mmHg.  Marland Kitchen  TRANSTHORACIC ECHOCARDIOGRAM  06/2016   normal LV size and function. EF 60-65% with GRD 2 DD. Mild aortic stenosis. Mild LA dilation. Mild to moderately increased PA pressures (46 mmHg).  . TUBAL LIGATION      There were no vitals filed for this visit.    Subjective Assessment - 12/20/20 1108    Subjective Pt. indicated continued complaints from Lt knee with some pain at night and various times during the day.  Cramping noted in back of leg/knee.  Pt. stated using cane off and on.  Continued similar complaints since last year s/p Lt TKA.    Limitations Standing;Walking;Sitting;House hold activities     Patient Stated Goals Reduce pain, get leg straight    Currently in Pain? Yes    Pain Score 0-No pain   pain at worst 9/10   Pain Location Knee    Pain Orientation Left    Pain Descriptors / Indicators Tightness;Sore;Aching    Pain Type Chronic pain    Pain Onset More than a month ago    Pain Frequency Intermittent    Aggravating Factors  getting knee straight    Pain Relieving Factors some improvement c movement to loosen up    Effect of Pain on Daily Activities Limited in standing, walking, sleeping at times.              [facility] PT Assessment - 12/20/20 0001      Assessment   Medical Diagnosis LE weakness s/p Lt TKA    Referring Provider (PT) Dr. Lorin Mercy    Onset Date/Surgical Date 02/24/20   TKA surgery     Precautions   Precautions None      Restrictions   Weight Bearing Restrictions No      Balance Screen   Has the patient fallen in the past 6 months No    Has the patient had a decrease in activity level because of a fear of falling?  No    Is the patient reluctant to leave their home because of a fear of falling?  No      Home Ecologist residence    Home Access Ramped entrance    [place] Two level      Prior Function   Level of [place]   Overall Cognitive Status Within Functional Limits for tasks assessed      Observation/Other Assessments   Focus on Therapeutic Outcomes (FOTO)  intake 42%, outcome expected 58%      Functional Tests   Functional tests Sit to Stand;Single leg stance      Single Leg Stance   Comments SLS each LE < 5 seconds      Sit to Stand   Comments able to perform from 18 inch chair s UE at this time      ROM / Strength   AROM / PROM / Strength Strength;PROM;AROM      AROM   AROM Assessment Site Knee;Ankle    Right/Left Knee Right;Left    Left Knee Extension -10   measured in seated LAQ   Left Knee Flexion 100   measured in supine heel slide   Right/Left Ankle  Right;Left      PROM   PROM Assessment Site Knee    Right/Left Knee Left;Right    Left Knee Flexion 105   measured supine     Strength   Overall Strength Comments knee extension dynamometer in sitting: Lt 26.4 lbs,  29 lbs, Rt 35 lbs, 33 lbs    Strength Assessment Site Knee;Hip;Ankle    Right/Left Hip Left;Right    Right Hip Flexion 5/5    Left Hip Flexion 4/5    Right/Left Knee Right;Left    Right Knee Flexion 5/5    Right Knee Extension 5/5    Left Knee Flexion 4/5    Left Knee Extension 4+/5    Right/Left Ankle Left;Right    Right Ankle Dorsiflexion 5/5    Left Ankle Dorsiflexion 5/5      Ambulation/Gait   Ambulation/Gait Yes    Ambulation/Gait Assistance 6: Modified independent (Device/Increase time)    Assistive device --   SPC   Gait Pattern Decreased stride length;Left flexed knee in stance;Antalgic                      Objective measurements completed on examination: See above findings.       [facility] Adult PT Treatment/Exercise - 12/20/20 0001      Exercises   Exercises Knee/Hip;Other Exercises    Other Exercises  HEP instruction/review c verbal, tactile, and visual cues, handout provided.  Trial set of each exercise performed to ensure technique knowledge.      Knee/Hip Exercises: Stretches   Passive Hamstring Stretch 3 reps;30 seconds   supine c strap   Other Knee/Hip Stretches supine TKE heel prop 2 mins c cues for positioning    Other Knee/Hip Stretches gastroc strech at wall (runner stretch) 30 sec x 2 Lt LE      Knee/Hip Exercises: Aerobic   Recumbent Bike Lvl 1 ROM focus 5 mins      Knee/Hip Exercises: Seated   Other Seated Knee/Hip Exercises seated LAQ, flexion stretch 2 sec hold x 10 Lt LE, seated SLR x 10 Lt LE      Knee/Hip Exercises: Supine   Other Supine Knee/Hip Exercises slr x 10 Lt LE                  PT Education - 12/20/20 1102    Education Details HEP, POC    Person(s) Educated Patient    Methods  Explanation;Demonstration;Tactile cues;Verbal cues;Handout    Comprehension Returned demonstration;Verbalized understanding;Need further instruction            PT Short Term Goals - 12/20/20 1103      PT SHORT TERM GOAL #1   Title Patient will demonstrate independent use of home exercise program to maintain progress from in clinic treatments.    Time 3    Period Weeks    Status New    Target Date 01/10/21             PT Long Term Goals - 12/20/20 1103      PT LONG TERM GOAL #1   Title Patient will demonstrate/report pain at worst less than or equal to 2/10 to facilitate minimal limitation in daily activity secondary to pain symptoms.    Time 8    Period Weeks    Status New    Target Date 02/14/21      PT LONG TERM GOAL #2   Title Patient will demonstrate independent use of home exercise program to facilitate ability to maintain/progress functional gains from skilled physical therapy services.    Time 8    Period Weeks    Status New    Target Date 02/14/21      PT LONG TERM GOAL #3   Title Pt. will demonstrate Lt LE MMT  5/5 throughout to facilitate ability to perform standing, walking, reciprocal stair navigation at PLOF.    Time 8    Period Weeks    Status New    Target Date 02/14/21      PT LONG TERM GOAL #4   Title Pt. will demonstrate FOTO outcome > = 58% to indicated reduced disability due to symptoms.    Time 8    Period Weeks    Status New    Target Date 02/14/21      PT LONG TERM GOAL #5   Title Pt. will demonstrate Lt knee AROM 0-110 deg to facilitate transfers, ambulation, stair/squat movmeent s restriction due to symptoms.    Time 8    Period Weeks    Status New    Target Date 02/14/21      Additional Long Term Goals   Additional Long Term Goals Yes      PT LONG TERM GOAL #6   Title Pt. will demonstrate independent ambulation community distances safely.    Time 8    Period Weeks    Status New    Target Date 02/14/21      PT LONG TERM GOAL  #7   Title Pt. will demonstrate bilateral SLS testing > or= 15 seconds to reduced fall risk.    Time 8    Period Weeks    Target Date 02/14/21                  Plan - 12/20/20 1104    Clinical Impression Statement Patient is a 87 y.o. female who comes to clinic with complaints of Lt knee pain with mobility, strength and movement coordination deficits that impair their ability to perform usual daily and recreational functional activities without increase difficulty/symptoms at this time.  Pt. was seen by clinic last year immediately post surgery with fair overall adhrence to treatment plan, HEP.  Pt. has had continued difficulty til now. Patient to benefit from skilled PT services to address impairments and limitations to improve to previous level of function without restriction secondary to condition.    Personal Factors and Comorbidities Comorbidity 3+    Comorbidities bilateral THA (L: 03/2017, R: 08/2019), chronic renal disease II, HTN, obesity, and R knee OA.    Examination-Activity Limitations Squat;Sleep;Bed Mobility;Bend;Stairs;Stand;Transfers;Locomotion Level    Examination-Participation Restrictions Community Activity;Shop;Meal Prep;Cleaning    Stability/Clinical Decision Making Stable/Uncomplicated    Clinical Decision Making Low    Rehab Potential Fair    PT Frequency 2x / week    PT Duration 8 weeks    PT Treatment/Interventions ADLs/Self Care Home Management;Cryotherapy;Electrical Stimulation;Iontophoresis 4mg /ml Dexamethasone;Moist Heat;Balance training;Therapeutic exercise;Therapeutic activities;Functional mobility training;Stair training;Gait training;DME Instruction;Ultrasound;Neuromuscular re-education;Patient/family education;Passive range of motion;Joint Manipulations;Spinal Manipulations;Dry needling;Taping;Vasopneumatic Device;Manual techniques    PT Next Visit Plan Review existing HEP, continue to improve flexion and extension, LE strengthening non compliant surface  balance.    PT Home Exercise Plan [email or web address]    Consulted and Agree with Plan of Care Patient           Patient will benefit from skilled therapeutic intervention in order to improve the following deficits and impairments:  Abnormal gait,Decreased endurance,Hypomobility,Increased edema,Decreased activity tolerance,Decreased strength,Pain,Increased fascial restricitons,Difficulty walking,Decreased mobility,Decreased balance,Decreased range of motion,Impaired perceived functional ability,Improper body mechanics,Impaired flexibility,Decreased coordination  Visit Diagnosis: Chronic pain of left knee  Muscle weakness (generalized)  Difficulty in walking, not elsewhere classified  Stiffness of left knee, not elsewhere classified  Localized edema     Problem List Patient Active Problem List  Diagnosis Date Noted  . Diastolic CHF, chronic ([place]) 12/18/2020  . Quadriceps weakness 12/06/2020  . S/P total knee arthroplasty, left 03/09/2020  . History of total hip arthroplasty, right 08/21/2019  . Intermittent claudication ([place]) 07/08/2019  . Hypertensive nephropathy 12/15/2018  . Chronic renal disease, stage II 12/15/2018  . H/O total hip arthroplasty, left 11/07/2017  . Presence of left artificial hip joint 05/14/2017  . Preop cardiovascular exam 03/12/2017  . Cough   . Abdominal pain   . Symptomatic cholelithiasis 07/01/2016  . Nonspecific ST-T wave electrocardiographic changes   . Lower leg edema 02/16/2015  . Obesity (BMI 30.0-34.9) 02/16/2015  . Essential hypertension 01/11/2012  . Hyperthyroidism 01/11/2012  . Hypercholesteremia 01/11/2012  . H/O vaginal hysterectomy 1974 01/11/2012    Class: History of  . S/P removal of lung  partial R lobe  2010 01/11/2012  . History of hernia repair  R ing. 1975 01/11/2012  . BACK PAIN 11/12/2007  . DYSPNEA 11/11/2007    Scot Jun, PT, DPT, OCS, ATC 12/20/20  12:06 PM    [facility] [address] [place], [place], [address] Phone: [phone]   Fax:  [phone]  Name: [name] MRN: [id number] Date of Birth: 13-May-1933

## 2020-12-20 NOTE — Patient Instructions (Signed)
Access Code: [email or web address] URL: [email or web address] Date: 12/20/2020 Prepared by: Scot Jun  Exercises Small Range Straight Leg Raise - 2 x daily - 7 x weekly - 3 sets - 10 reps Supine Hamstring Stretch with Strap - 2-3 x daily - 7 x weekly - 1 sets - 5 reps - 30 hold Supine Knee Extension Mobilization with Weight - 4-5 x daily - 7 x weekly - 1 sets - 1 reps - to tolerance up to 15 mins hold Seated Long Arc Quad - 2 x daily - 7 x weekly - 3 sets - 10 reps - 2 hold Seated Straight Leg Heel Taps - 2 x daily - 7 x weekly - 3 sets - 10 reps Gastroc Stretch on Wall - 2 x daily - 7 x weekly - 1 sets - 5 reps - 30 hold

## 2020-12-27 ENCOUNTER — Other Ambulatory Visit: Payer: Self-pay

## 2020-12-27 ENCOUNTER — Ambulatory Visit (INDEPENDENT_AMBULATORY_CARE_PROVIDER_SITE_OTHER): Payer: Medicare Other | Admitting: Internal Medicine

## 2020-12-27 ENCOUNTER — Encounter: Payer: Self-pay | Admitting: Internal Medicine

## 2020-12-27 VITALS — BP 116/70 | HR 51 | Temp 97.6°F | Ht 61.0 in | Wt 184.4 lb

## 2020-12-27 DIAGNOSIS — R1013 Epigastric pain: Secondary | ICD-10-CM

## 2020-12-27 DIAGNOSIS — D649 Anemia, unspecified: Secondary | ICD-10-CM | POA: Diagnosis not present

## 2020-12-27 DIAGNOSIS — E7800 Pure hypercholesterolemia, unspecified: Secondary | ICD-10-CM

## 2020-12-27 DIAGNOSIS — N182 Chronic kidney disease, stage 2 (mild): Secondary | ICD-10-CM | POA: Diagnosis not present

## 2020-12-27 DIAGNOSIS — I700 Atherosclerosis of aorta: Secondary | ICD-10-CM | POA: Diagnosis not present

## 2020-12-27 DIAGNOSIS — Z6834 Body mass index (BMI) 34.0-34.9, adult: Secondary | ICD-10-CM | POA: Diagnosis not present

## 2020-12-27 DIAGNOSIS — Z23 Encounter for immunization: Secondary | ICD-10-CM

## 2020-12-27 DIAGNOSIS — Z2821 Immunization not carried out because of patient refusal: Secondary | ICD-10-CM

## 2020-12-27 DIAGNOSIS — E6609 Other obesity due to excess calories: Secondary | ICD-10-CM

## 2020-12-27 DIAGNOSIS — I129 Hypertensive chronic kidney disease with stage 1 through stage 4 chronic kidney disease, or unspecified chronic kidney disease: Secondary | ICD-10-CM | POA: Diagnosis not present

## 2020-12-27 DIAGNOSIS — E0590 Thyrotoxicosis, unspecified without thyrotoxic crisis or storm: Secondary | ICD-10-CM

## 2020-12-27 LAB — CBC
Hematocrit: 37.8 % (ref 34.0–46.6)
Hemoglobin: 12.5 g/dL (ref 11.1–15.9)
MCH: 27.3 pg (ref 26.6–33.0)
MCHC: 33.1 g/dL (ref 31.5–35.7)
MCV: 83 fL (ref 79–97)
Platelets: 403 10*3/uL (ref 150–450)
RBC: 4.58 x10E6/uL (ref 3.77–5.28)
RDW: 14.6 % (ref 11.7–15.4)
WBC: 5.1 10*3/uL (ref 3.4–10.8)

## 2020-12-27 MED ORDER — FAMOTIDINE 20 MG PO TABS: 20.0000 mg | ORAL_TABLET | Freq: Every day | ORAL | 1 refills | Status: DC

## 2020-12-27 NOTE — Patient Instructions (Signed)
Food Choices for Gastroesophageal Reflux Disease, Adult When you have gastroesophageal reflux disease (GERD), the foods you eat and your eating habits are very important. Choosing the right foods can help ease your discomfort. Think about working with a food expert (dietitian) to help you make good choices. What are tips for following this plan? Reading food labels  Look for foods that are low in saturated fat. Foods that may help with your symptoms include: ? Foods that have less than 5% of daily value (DV) of fat. ? Foods that have 0 grams of trans fat. Cooking  Do not fry your food.  Cook your food by baking, steaming, grilling, or broiling. These are all methods that do not need a lot of fat for cooking.  To add flavor, try to use herbs that are low in spice and acidity. Meal planning  Choose healthy foods that are low in fat, such as: ? Fruits and vegetables. ? Whole grains. ? Low-fat dairy products. ? Lean meats, fish, and poultry.  Eat small meals often instead of eating 3 large meals each day. Eat your meals slowly in a place where you are relaxed. Avoid bending over or lying down until 2-3 hours after eating.  Limit high-fat foods such as fatty meats or fried foods.  Limit your intake of fatty foods, such as oils, butter, and shortening.  Avoid the following as told by your doctor: ? Foods that cause symptoms. These may be different for different people. Keep a food diary to keep track of foods that cause symptoms. ? Alcohol. ? Drinking a lot of liquid with meals. ? Eating meals during the 2-3 hours before bed.   Lifestyle  Stay at a healthy weight. Ask your doctor what weight is healthy for you. If you need to lose weight, work with your doctor to do so safely.  Exercise for at least 30 minutes on 5 or more days each week, or as told by your doctor.  Wear loose-fitting clothes.  Do not smoke or use any products that contain nicotine or tobacco. If you need help  quitting, ask your doctor.  Sleep with the head of your bed higher than your feet. Use a wedge under the mattress or blocks under the bed frame to raise the head of the bed.  Chew sugar-free gum after meals. What foods should eat? Eat a healthy, well-balanced diet of fruits, vegetables, whole grains, low-fat dairy products, lean meats, fish, and poultry. Each person is different. Foods that may cause symptoms in one person may not cause any symptoms in another person. Work with your doctor to find foods that are safe for you. The items listed above may not be a complete list of what you can eat and drink. Contact a food expert for more options.   What foods should I avoid? Limiting some of these foods may help in managing the symptoms of GERD. Everyone is different. Talk with a food expert or your doctor to help you find the exact foods to avoid, if any. Fruits Any fruits prepared with added fat. Any fruits that cause symptoms. For some people, this may include citrus fruits, such as oranges, grapefruit, pineapple, and lemons. Vegetables Deep-fried vegetables. French fries. Any vegetables prepared with added fat. Any vegetables that cause symptoms. For some people, this may include tomatoes and tomato products, chili peppers, onions and garlic, and horseradish. Grains Pastries or quick breads with added fat. Meats and other proteins High-fat meats, such as fatty beef or pork,   hot dogs, ribs, ham, sausage, salami, and bacon. Fried meat or protein, including fried fish and fried chicken. Nuts and nut butters, in large amounts. Dairy Whole milk and chocolate milk. Sour cream. Cream. Ice cream. Cream cheese. Milkshakes. Fats and oils Butter. Margarine. Shortening. Ghee. Beverages Coffee and tea, with or without caffeine. Carbonated beverages. Sodas. Energy drinks. Fruit juice made with acidic fruits, such as orange or grapefruit. Tomato juice. Alcoholic drinks. Sweets and desserts Chocolate and  cocoa. Donuts. Seasonings and condiments Pepper. Peppermint and spearmint. Added salt. Any condiments, herbs, or seasonings that cause symptoms. For some people, this may include curry, hot sauce, or vinegar-based salad dressings. The items listed above may not be a complete list of what you should not eat and drink. Contact a food expert for more options. Questions to ask your doctor Diet and lifestyle changes are often the first steps that are taken to manage symptoms of GERD. If diet and lifestyle changes do not help, talk with your doctor about taking medicines. Where to find more information  [employer] Gastrointestinal [employer]: aboutgerd.org Summary  When you have GERD, food and lifestyle choices are very important in easing your symptoms.  Eat small meals often instead of 3 large meals a day. Eat your meals slowly and in a place where you are relaxed.  Avoid bending over or lying down until 2-3 hours after eating.  Limit high-fat foods such as fatty meats or fried foods. This information is not intended to replace advice given to you by your health care provider. Make sure you discuss any questions you have with your health care provider. Document Revised: 04/04/2020 Document Reviewed: 04/04/2020 Elsevier Patient Education  2021 Elsevier Inc.  

## 2020-12-27 NOTE — Progress Notes (Signed)
I,Katawbba Wiggins,acting as a Education administrator for Maximino Greenland, MD.,have documented all relevant documentation on the behalf of Maximino Greenland, MD,as directed by  Maximino Greenland, MD while in the presence of Maximino Greenland, MD.  This visit occurred during the SARS-CoV-2 public health emergency.  Safety protocols were in place, including screening questions prior to the visit, additional usage of staff PPE, and extensive cleaning of exam room while observing appropriate contact time as indicated for disinfecting solutions.  Subjective:     Patient ID: [name] , female    DOB: 1932/11/17 , 87 y.o.   MRN: [id number]   Chief Complaint  Patient presents with  . Hypertension    HPI  The patient is here today for HTN f/u. She reports compliance with meds. Denies headaches, chest pain and shortness of breath.   Hypertension This is a chronic problem. The current episode started more than 1 year ago. The problem has been gradually improving since onset. The problem is controlled. Pertinent negatives include no blurred vision, chest pain, palpitations or shortness of breath. Risk factors for coronary artery disease include dyslipidemia, obesity and post-menopausal state. Past treatments include beta blockers. The current treatment provides moderate improvement. Compliance problems include exercise.      Past Medical History:  Diagnosis Date  . Aortic stenosis    Echo 07/05/16: Very mild AS, Mean gradient 10 mm Hg, Peak gradient (S) 19 mmHg   . Arthritis    left hip and back  . Bronchiectasis    isolated to RML; status post right middle lobe partial resection.  . Complication of anesthesia    hard to wake up  . Dyspnea   . Gall stones   . GERD (gastroesophageal reflux disease)    history   . H/O osteoporosis   . Hypertension   . Hyperthyroidism    endocrinologist - Dr. Chalmers Cater  . Pneumonia   . Yeast infection      Family History  Problem Relation Age of Onset  . Hypertension Mother    . CVA Mother 56  . Heart attack Sister   . Diabetes Sister   . Healthy Father   . Heart attack Brother   . Heart attack Brother      Current Outpatient Medications:  .  busPIRone (BUSPAR) 5 MG tablet, Take 1 tablet (5 mg total) by mouth 2 (two) times daily. (Patient taking differently: Take 5 mg by mouth daily as needed (anxiety).), Disp: 60 tablet, Rfl: 1 .  calcium carbonate (OS-CAL) 600 MG TABS tablet, Take 600 mg by mouth daily., Disp: , Rfl:  .  cholecalciferol (VITAMIN D3) 25 MCG (1000 UT) tablet, Take 1,000 Units by mouth daily., Disp: , Rfl:  .  diclofenac Sodium (VOLTAREN) 1 % GEL, APPLY 2 GRAMS TOPICALLY FOUR TIMES A DAY, Disp: 100 g, Rfl: 27 .  famotidine (PEPCID) 20 MG tablet, Take 1 tablet (20 mg total) by mouth daily., Disp: 30 tablet, Rfl: 1 .  fenofibrate (TRICOR) 145 MG tablet, TAKE 1 TABLET DAILY, Disp: 90 tablet, Rfl: 3 .  Flaxseed, Linseed, (FLAXSEED OIL) 1000 MG CAPS, Take 1,000 mg by mouth daily., Disp: , Rfl:  .  furosemide (LASIX) 20 MG tablet, TAKE 1 TABLET DAILY, Disp: 90 tablet, Rfl: 3 .  methimazole (TAPAZOLE) 5 MG tablet, Take 2.5 mg by mouth daily. , Disp: , Rfl:  .  metoprolol succinate (TOPROL XL) 25 MG 24 hr tablet, Take 1 tablet (25 mg total) by mouth daily., Disp: 90  tablet, Rfl: 3 .  nystatin cream (MYCOSTATIN), APPLY CREAM TO AFFECTED AREA TWICE A DAY AS NEEDED (Patient taking differently: Apply 1 application topically 2 (two) times daily as needed for dry skin.), Disp: 30 g, Rfl: 23 .  potassium chloride SA (KLOR-CON) 20 MEQ tablet, TAKE 1 TABLET DAILY, Disp: 90 tablet, Rfl: 3   Allergies  Allergen Reactions  . Ibuprofen Other (See Comments)    hallucinations  . Naproxen Sodium Other (See Comments)    hallucinations     Review of Systems  Constitutional: Negative.   Eyes: Negative for blurred vision.  Respiratory: Negative.  Negative for shortness of breath.   Cardiovascular: Negative.  Negative for chest pain and palpitations.   Gastrointestinal: Negative.        She c/o having heartburn after eating. This is a new sx. Also reports belching. Has not tried any otc meds for relief. No associated n/v/d. No fever/chills.   Psychiatric/Behavioral: Negative.   All other systems reviewed and are negative.    Today's Vitals   12/27/20 1038  BP: 116/70  Pulse: (!) 51  Temp: 97.6 F (36.4 C)  TempSrc: Oral  Weight: 184 lb 6.4 oz (83.6 kg)  Height: _0  (1.549 m)  PainSc: 0-No pain   Body mass index is 34.84 kg/m.  Wt Readings from Last 3 Encounters:  12/27/20 184 lb 6.4 oz (83.6 kg)  12/06/20 187 lb (84.8 kg)  11/21/20 188 lb (85.3 kg)   Objective:  Physical Exam Vitals and nursing note reviewed.  Constitutional:      Appearance: Normal appearance. She is obese.  HENT:     Head: Normocephalic and atraumatic.     Nose:     Comments: Masked     Mouth/Throat:     Comments: Masked  Cardiovascular:     Rate and Rhythm: Normal rate and regular rhythm.     Heart sounds: Murmur heard.    Pulmonary:     Effort: Pulmonary effort is normal.     Breath sounds: Normal breath sounds.  Musculoskeletal:     Cervical back: Normal range of motion.  Skin:    General: Skin is warm.  Neurological:     General: No focal deficit present.     Mental Status: She is alert and oriented to person, place, and time.         Assessment And Plan:     1. Hypertensive nephropathy Comments: Chronic, well controlled. Encouraged to follow low sodium diet. Agrees to CCM referral. She agrees to f/u in 6 months for re-evaluation.  - AMB Referral to [place]  2. Chronic renal disease, stage II Comments: Chronic, I will check GFr, Cr today. Advised to stay well hydyrated. - AMB Referral to [place]  3. Dyspepsia Comments: I will send rx famotidine 16m once daily. She will let me know if her sx persist.  4. Atherosclerosis of aorta (HCC) Chronic, encouraged to follow heart  healthy lifestyle. Clean eating, regular exercise and stress management are all a part of this lifestyle. Previous lipid results from Sept 2021 reviewed, LDL 100.   - [place]B Referral to [facility] 5. Anemia, unspecified type Comments: Previous labs reviewed, I will check labs as listed below. She denies seeing blood in her stools and gingival bleeding. - CBC no Diff - Iron and IBC (([id number] - Ferritin  6. Class 1 obesity due to excess calories with serious comorbidity and body mass index (BMI) of 34.0 to  34.9 in adult Comments: She is encouraged to strive for BMI less than 30 to decrease cardiac risk. Encouraged to increase her daily actiivty, chair exercises included.   7. Immunization due Comments: She does not wish to receive COVID vaccine at this time.    Patient was given opportunity to ask questions. Patient verbalized understanding of the plan and was able to repeat key elements of the plan. All questions were answered to their satisfaction.   I, Maximino Greenland, MD, have reviewed all documentation for this visit. The documentation on 12/27/20 for the exam, diagnosis, procedures, and orders are all accurate and complete.   IF YOU HAVE BEEN REFERRED TO A SPECIALIST, IT MAY TAKE 1-2 WEEKS TO SCHEDULE/PROCESS THE REFERRAL. IF YOU HAVE NOT HEARD FROM US/SPECIALIST IN TWO WEEKS, PLEASE GIVE [place] A CALL AT [phone] X [phone].   THE PATIENT IS ENCOURAGED TO PRACTICE SOCIAL DISTANCING DUE TO THE COVID-19 PANDEMIC.

## 2020-12-29 ENCOUNTER — Telehealth: Payer: Self-pay | Admitting: *Deleted

## 2020-12-29 NOTE — Chronic Care Management (AMB) (Signed)
  Chronic Care Management   Outreach Note  12/29/2020 Name: [name] MRN: [id number] DOB: 1933-07-04  [name] is a 87 y.o. year old female who is a primary care patient of Glendale Chard, MD. I reached out to Burgess Amor by phone today in response to a referral sent by Ms. [name]'s PCP, Dr. Baird Cancer.      An unsuccessful telephone outreach was attempted today. The patient was referred to the case management team for assistance with care management and care coordination.   Follow Up Plan: A HIPAA compliant phone message was left for the patient providing contact information and requesting a return call. The care management team will reach out to the patient again over the next 7 days. If patient returns call to provider office, please advise to call [place] at [phone].  [facility]

## 2020-12-29 NOTE — Chronic Care Management (AMB) (Signed)
  Chronic Care Management   Note  12/29/2020 Name: [name] MRN: [id number] DOB: [birth year]  [name] is a 87 y.o. year old female who is a primary care patient of Glendale Chard, MD. I reached out to Burgess Amor by phone today in response to a referral sent by [name] PCP, Dr. Baird Cancer.     Ms. Cranfield was given information about Chronic Care Management services today including:  1. CCM service includes personalized support from designated clinical staff supervised by her physician, including individualized plan of care and coordination with other care providers 2. 24/7 contact phone numbers for assistance for urgent and routine care needs. 3. Service will only be billed when office clinical staff spend 20 minutes or more in a month to coordinate care. 4. Only one practitioner may furnish and bill the service in a calendar month. 5. The patient may stop CCM services at any time (effective at the end of the month) by phone call to the office staff. 6. The patient will be responsible for cost sharing (co-pay) of up to 20% of the service fee (after annual deductible is met).  Patient did not agree to enrollment in care management services and does not wish to consider at this time.  Follow up plan: The care management team is available to follow up with the patient after provider conversation with the patient regarding recommendation for care management engagement and subsequent re-referral to the care management team.   [facility]

## 2021-01-01 DIAGNOSIS — R1013 Epigastric pain: Secondary | ICD-10-CM | POA: Insufficient documentation

## 2021-01-01 DIAGNOSIS — I700 Atherosclerosis of aorta: Secondary | ICD-10-CM | POA: Insufficient documentation

## 2021-01-06 ENCOUNTER — Other Ambulatory Visit: Payer: Self-pay

## 2021-01-06 ENCOUNTER — Encounter: Payer: Self-pay | Admitting: Physical Therapy

## 2021-01-06 ENCOUNTER — Ambulatory Visit (INDEPENDENT_AMBULATORY_CARE_PROVIDER_SITE_OTHER): Payer: Medicare Other | Admitting: Physical Therapy

## 2021-01-06 DIAGNOSIS — G8929 Other chronic pain: Secondary | ICD-10-CM

## 2021-01-06 DIAGNOSIS — R262 Difficulty in walking, not elsewhere classified: Secondary | ICD-10-CM | POA: Diagnosis not present

## 2021-01-06 DIAGNOSIS — M25662 Stiffness of left knee, not elsewhere classified: Secondary | ICD-10-CM | POA: Diagnosis not present

## 2021-01-06 DIAGNOSIS — M25562 Pain in left knee: Secondary | ICD-10-CM | POA: Diagnosis not present

## 2021-01-06 DIAGNOSIS — M6281 Muscle weakness (generalized): Secondary | ICD-10-CM | POA: Diagnosis not present

## 2021-01-06 DIAGNOSIS — R600 Localized edema: Secondary | ICD-10-CM | POA: Diagnosis not present

## 2021-01-06 NOTE — Therapy (Signed)
[place], [place], [address] Phone: [phone]   Fax:  [phone]  Physical Therapy Treatment  Patient Details  Name: [name] MRN: [id number] Date of Birth: Jun 20, 1933 Referring Provider (PT): Dr. Lorin Mercy   Encounter Date: 01/06/2021   PT End of Session - 01/06/21 1133    Visit Number 2    Number of Visits 16    Date for PT Re-Evaluation 02/14/21    Authorization Type Medicare    Progress Note Due on Visit 10    PT Start Time 1761    PT Stop Time 1133    PT Time Calculation (min) 42 min    Activity Tolerance Patient tolerated treatment well    Behavior During Therapy [facility] for tasks assessed/performed           Past Medical History:  Diagnosis Date  . Aortic stenosis    Echo 07/05/16: Very mild AS, Mean gradient 10 mm Hg, Peak gradient (S) 19 mmHg   . Arthritis    left hip and back  . Bronchiectasis    isolated to RML; status post right middle lobe partial resection.  . Complication of anesthesia    hard to wake up  . Dyspnea   . Gall stones   . GERD (gastroesophageal reflux disease)    history   . H/O osteoporosis   . Hypertension   . Hyperthyroidism    endocrinologist - Dr. Chalmers Cater  . Pneumonia   . Yeast infection     Past Surgical History:  Procedure Laterality Date  . ABDOMINAL HYSTERECTOMY  1974  . CHOLECYSTECTOMY N/A 07/02/2016   Procedure: LAPAROSCOPIC CHOLECYSTECTOMY;  Surgeon: Stark Klein, MD;  Location: [place];  Service: General;  Laterality: N/A;  . COLONOSCOPY    . ENDOSCOPIC RETROGRADE CHOLANGIOPANCREATOGRAPHY (ERCP) WITH PROPOFOL N/A 08/07/2018   Procedure: ENDOSCOPIC RETROGRADE CHOLANGIOPANCREATOGRAPHY (ERCP) WITH PROPOFOL;  Surgeon: Carol Ada, MD;  [facility];  Service: Endoscopy;  Laterality: N/A;  . EYE SURGERY     stye removed  . HARDWARE REMOVAL Left 02/24/2020   Procedure: Hardware Removal, left knee;  Surgeon: [name], MD;  Location: [place];  Service:  Orthopedics;  Laterality: Left;  . HERNIA REPAIR  1975  . KNEE SURGERY Left   . LUNG REMOVAL, PARTIAL  208   RML  . NM MYOVIEW LTD  06/2011   Normal EF. No ischemia or infarction.  [name]  08/07/2018   Procedure: [name];  Surgeon: Carol Ada, MD;  [facility];  Service: Endoscopy;;  balloon sweep  . STERIOD INJECTION Right 02/24/2020   Procedure: RIGHT KNEE STEROID INTRA-ARTICULAR MARCAINE/DEPO-MEDROL INJECTION UNDER ANESTHESIA;  Surgeon: [name], MD;  Location: [place];  Service: Orthopedics;  Laterality: Right;  . TOTAL HIP ARTHROPLASTY Left 04/05/2017   Procedure: LEFT TOTAL HIP ARTHROPLASTY ANTERIOR APPROACH;  Surgeon: [name], MD;  Location: [place];  Service: Orthopedics;  Laterality: Left;  . TOTAL HIP ARTHROPLASTY Right 12/19/2018  . TOTAL HIP ARTHROPLASTY Right 12/19/2018   Procedure: RIGHT TOTAL HIP ARTHROPLASTY-DIRECT ANTERIOR APPROACH;  Surgeon: [name], MD;  Location: [place];  Service: Orthopedics;  Laterality: Right;  . TOTAL KNEE ARTHROPLASTY Left 02/24/2020   Procedure: LEFT TOTAL KNEE ARTHROPLASTY;  Surgeon: [name], MD;  Location: [place];  Service: Orthopedics;  Laterality: Left;  . TRANSTHORACIC ECHOCARDIOGRAM  06/2011   Mild concentric LVH. Normal EF with impaired relaxation. Mildly elevated RV pressures of 30 and 40 mmHg.  Marland Kitchen  TRANSTHORACIC ECHOCARDIOGRAM  06/2016   normal LV size and function. EF 60-65% with GRD 2 DD. Mild aortic stenosis. Mild LA dilation. Mild to moderately increased PA pressures (46 mmHg).  . TUBAL LIGATION      There were no vitals filed for this visit.   Subjective Assessment - 01/06/21 1054    Subjective knee is sore - "I'm trying to do the exercises, I just get so sore."    Limitations Standing;Walking;Sitting;House hold activities    Patient Stated Goals Reduce pain, get leg straight    Currently in Pain? No/denies                             [facility] Adult PT  Treatment/Exercise - 01/06/21 1056      Knee/Hip Exercises: Stretches   Passive Hamstring Stretch 5 reps;30 seconds    Passive Hamstring Stretch Limitations supine with strap    Gastroc Stretch Left;3 reps;30 seconds      Knee/Hip Exercises: Aerobic   Recumbent Bike Lvl 1 ROM focus 8 mins      Knee/Hip Exercises: Standing   Heel Raises Both;10 reps    Hip Abduction Both;10 reps;Knee straight    Abduction Limitations bil UE support    Hip Extension Both;10 reps;Knee straight    Extension Limitations bil UE support      Knee/Hip Exercises: Seated   Long Arc Quad Left;10 reps    Other Seated Knee/Hip Exercises SLR x 10 reps; Lt      Knee/Hip Exercises: Supine   Bridges Both;10 reps    Straight Leg Raises Left;10 reps    Other Supine Knee/Hip Exercises --      Knee/Hip Exercises: Sidelying   Clams Lt x 10 reps; 3 sec hold                    PT Short Term Goals - 01/06/21 1133      PT SHORT TERM GOAL #1   Title Patient will demonstrate independent use of home exercise program to maintain progress from in clinic treatments.    Baseline 4/1: min cues needed today    Time 3    Period Weeks    Status On-going    Target Date 01/10/21             PT Long Term Goals - 01/06/21 1133      PT LONG TERM GOAL #1   Title Patient will demonstrate/report pain at worst less than or equal to 2/10 to facilitate minimal limitation in daily activity secondary to pain symptoms.    Time 8    Period Weeks    Status On-going    Target Date 02/14/21      PT LONG TERM GOAL #2   Title Patient will demonstrate independent use of home exercise program to facilitate ability to maintain/progress functional gains from skilled physical therapy services.    Time 8    Period Weeks    Status On-going      PT LONG TERM GOAL #3   Title Pt. will demonstrate Lt LE MMT 5/5 throughout to facilitate ability to perform standing, walking, reciprocal stair navigation at PLOF.    Time 8     Period Weeks    Status On-going      PT LONG TERM GOAL #4   Title Pt. will demonstrate FOTO outcome > = 58% to indicated reduced disability due to symptoms.    Time 8  Period Weeks    Status On-going      PT LONG TERM GOAL #5   Title Pt. will demonstrate Lt knee AROM 0-110 deg to facilitate transfers, ambulation, stair/squat movmeent s restriction due to symptoms.    Time 8    Period Weeks    Status On-going      PT LONG TERM GOAL #6   Title Pt. will demonstrate independent ambulation community distances safely.    Time 8    Period Weeks    Status On-going      PT LONG TERM GOAL #7   Title Pt. will demonstrate bilateral SLS testing > or= 15 seconds to reduced fall risk.    Time 8    Period Weeks    Status On-going                 Plan - 01/06/21 1134    Clinical Impression Statement Session today focused on HEP review with continued strengthening.  Pt with poor activity tolerance and fatigues quickly with light activity.  HEP causes her to feel sore, and encouraged her to continue as able and that some soreness is to be expected.  Will continue to benefit from PT to maximize function.    Personal Factors and Comorbidities Comorbidity 3+    Comorbidities bilateral THA (L: 03/2017, R: 08/2019), chronic renal disease II, HTN, obesity, and R knee OA.    Examination-Activity Limitations Squat;Sleep;Bed Mobility;Bend;Stairs;Stand;Transfers;Locomotion Level    Examination-Participation Restrictions Community Activity;Shop;Meal Prep;Cleaning    Stability/Clinical Decision Making Stable/Uncomplicated    Rehab Potential Fair    PT Frequency 2x / week    PT Duration 8 weeks    PT Treatment/Interventions ADLs/Self Care Home Management;Cryotherapy;Electrical Stimulation;Iontophoresis 4mg /ml Dexamethasone;Moist Heat;Balance training;Therapeutic exercise;Therapeutic activities;Functional mobility training;Stair training;Gait training;DME Instruction;Ultrasound;Neuromuscular  re-education;Patient/family education;Passive range of motion;Joint Manipulations;Spinal Manipulations;Dry needling;Taping;Vasopneumatic Device;Manual techniques    PT Next Visit Plan continue to improve flexion and extension, LE strengthening non compliant surface balance.    PT Home Exercise Plan [id number]    Consulted and Agree with Plan of Care Patient           Patient will benefit from skilled therapeutic intervention in order to improve the following deficits and impairments:  Abnormal gait,Decreased endurance,Hypomobility,Increased edema,Decreased activity tolerance,Decreased strength,Pain,Increased fascial restricitons,Difficulty walking,Decreased mobility,Decreased balance,Decreased range of motion,Impaired perceived functional ability,Improper body mechanics,Impaired flexibility,Decreased coordination  Visit Diagnosis: Chronic pain of left knee  Muscle weakness (generalized)  Difficulty in walking, not elsewhere classified  Stiffness of left knee, not elsewhere classified  Localized edema     Problem List Patient Active Problem List   Diagnosis Date Noted  . Dyspepsia 01/01/2021  . Atherosclerosis of aorta ([place]) 01/01/2021  . Diastolic CHF, chronic ([place]) 12/18/2020  . Quadriceps weakness 12/06/2020  . S/P total knee arthroplasty, left 03/09/2020  . History of total hip arthroplasty, right 08/21/2019  . Intermittent claudication ([place]) 07/08/2019  . Hypertensive nephropathy 12/15/2018  . Chronic renal disease, stage II 12/15/2018  . H/O total hip arthroplasty, left 11/07/2017  . Presence of left artificial hip joint 05/14/2017  . Preop cardiovascular exam 03/12/2017  . Cough   . Abdominal pain   . Symptomatic cholelithiasis 07/01/2016  . Nonspecific ST-T wave electrocardiographic changes   . Lower leg edema 02/16/2015  . Obesity (BMI 30.0-34.9) 02/16/2015  . Essential hypertension 01/11/2012  . Hyperthyroidism 01/11/2012  . Hypercholesteremia 01/11/2012  . H/O  vaginal hysterectomy 1974 01/11/2012    Class: History of  . S/P removal of lung  partial R  lobe  2010 01/11/2012  . History of hernia repair  R ing. 1975 01/11/2012  . BACK PAIN 11/12/2007  . DYSPNEA 11/11/2007      Laureen Abrahams, PT, DPT 01/06/21 11:40 AM    [facility] Physical Therapy [address] [place], [place], [address] Phone: [phone]   Fax:  [phone]  Name: SHULAMIT DONOFRIO MRN: [id number] Date of Birth: Aug 13, 1933

## 2021-01-10 ENCOUNTER — Ambulatory Visit (INDEPENDENT_AMBULATORY_CARE_PROVIDER_SITE_OTHER): Payer: Medicare Other | Admitting: Rehabilitative and Restorative Service Providers"

## 2021-01-10 ENCOUNTER — Other Ambulatory Visit: Payer: Self-pay

## 2021-01-10 ENCOUNTER — Encounter: Payer: Self-pay | Admitting: Rehabilitative and Restorative Service Providers"

## 2021-01-10 DIAGNOSIS — G8929 Other chronic pain: Secondary | ICD-10-CM

## 2021-01-10 DIAGNOSIS — R262 Difficulty in walking, not elsewhere classified: Secondary | ICD-10-CM | POA: Diagnosis not present

## 2021-01-10 DIAGNOSIS — R600 Localized edema: Secondary | ICD-10-CM | POA: Diagnosis not present

## 2021-01-10 DIAGNOSIS — M6281 Muscle weakness (generalized): Secondary | ICD-10-CM

## 2021-01-10 DIAGNOSIS — M25562 Pain in left knee: Secondary | ICD-10-CM | POA: Diagnosis not present

## 2021-01-10 DIAGNOSIS — M25662 Stiffness of left knee, not elsewhere classified: Secondary | ICD-10-CM | POA: Diagnosis not present

## 2021-01-10 NOTE — Therapy (Signed)
[place], [place], [address] Phone: [phone]   Fax:  [phone]  Physical Therapy Treatment  Patient Details  Name: [name] MRN: [id number] Date of Birth: [birth year] Referring Provider (PT): Dr. Lorin Mercy   Encounter Date: 01/10/2021   PT End of Session - 01/10/21 1349    Visit Number 3    Number of Visits 16    Date for PT Re-Evaluation 02/14/21    Authorization Type Medicare    Progress Note Due on Visit 10    PT Start Time 1300    PT Stop Time 1345    PT Time Calculation (min) 45 min    Activity Tolerance Patient tolerated treatment well;Patient limited by fatigue    Behavior During Therapy [facility] for tasks assessed/performed           Past Medical History:  Diagnosis Date  . Aortic stenosis    Echo 07/05/16: Very mild AS, Mean gradient 10 mm Hg, Peak gradient (S) 19 mmHg   . Arthritis    left hip and back  . Bronchiectasis    isolated to RML; status post right middle lobe partial resection.  . Complication of anesthesia    hard to wake up  . Dyspnea   . Gall stones   . GERD (gastroesophageal reflux disease)    history   . H/O osteoporosis   . Hypertension   . Hyperthyroidism    endocrinologist - Dr. Chalmers Cater  . Pneumonia   . Yeast infection     Past Surgical History:  Procedure Laterality Date  . ABDOMINAL HYSTERECTOMY  1974  . CHOLECYSTECTOMY N/A 07/02/2016   Procedure: LAPAROSCOPIC CHOLECYSTECTOMY;  Surgeon: Stark Klein, MD;  Location: [place];  Service: General;  Laterality: N/A;  . COLONOSCOPY    . ENDOSCOPIC RETROGRADE CHOLANGIOPANCREATOGRAPHY (ERCP) WITH PROPOFOL N/A 08/07/2018   Procedure: ENDOSCOPIC RETROGRADE CHOLANGIOPANCREATOGRAPHY (ERCP) WITH PROPOFOL;  Surgeon: Carol Ada, MD;  [facility];  Service: Endoscopy;  Laterality: N/A;  . EYE SURGERY     stye removed  . HARDWARE REMOVAL Left 02/24/2020   Procedure: Hardware Removal, left knee;  Surgeon: [name], MD;   Location: [place];  Service: Orthopedics;  Laterality: Left;  . HERNIA REPAIR  1975  . KNEE SURGERY Left   . LUNG REMOVAL, PARTIAL  208   RML  . NM MYOVIEW LTD  06/2011   Normal EF. No ischemia or infarction.  [name]  08/07/2018   Procedure: [name];  Surgeon: Carol Ada, MD;  [facility];  Service: Endoscopy;;  balloon sweep  . STERIOD INJECTION Right 02/24/2020   Procedure: RIGHT KNEE STEROID INTRA-ARTICULAR MARCAINE/DEPO-MEDROL INJECTION UNDER ANESTHESIA;  Surgeon: [name], MD;  Location: [place];  Service: Orthopedics;  Laterality: Right;  . TOTAL HIP ARTHROPLASTY Left 04/05/2017   Procedure: LEFT TOTAL HIP ARTHROPLASTY ANTERIOR APPROACH;  Surgeon: [name], MD;  Location: [place];  Service: Orthopedics;  Laterality: Left;  . TOTAL HIP ARTHROPLASTY Right 12/19/2018  . TOTAL HIP ARTHROPLASTY Right 12/19/2018   Procedure: RIGHT TOTAL HIP ARTHROPLASTY-DIRECT ANTERIOR APPROACH;  Surgeon: [name], MD;  Location: [place];  Service: Orthopedics;  Laterality: Right;  . TOTAL KNEE ARTHROPLASTY Left 02/24/2020   Procedure: LEFT TOTAL KNEE ARTHROPLASTY;  Surgeon: [name], MD;  Location: [place];  Service: Orthopedics;  Laterality: Left;  . TRANSTHORACIC ECHOCARDIOGRAM  06/2011   Mild concentric LVH. Normal EF with impaired relaxation. Mildly elevated RV pressures of 30 and 40  mmHg.  Marland Kitchen TRANSTHORACIC ECHOCARDIOGRAM  06/2016   normal LV size and function. EF 60-65% with GRD 2 DD. Mild aortic stenosis. Mild LA dilation. Mild to moderately increased PA pressures (46 mmHg).  . TUBAL LIGATION      There were no vitals filed for this visit.   Subjective Assessment - 01/10/21 1308    Subjective [name] reports she "works harder" in physical therapy than at home.  "At Home, when I get tired, I quit."    Limitations Standing;Walking;Sitting;House hold activities    Patient Stated Goals Reduce pain, get leg straight    Currently in Pain? No/denies                              [facility] Adult PT Treatment/Exercise - 01/10/21 0001      Therapeutic Activites    Therapeutic Activities ADL's    ADL's Step-up and over 4" step (focus on bending knee and slow eccentrics)      Exercises   Exercises Knee/Hip      Knee/Hip Exercises: Stretches   Active Hamstring Stretch Both;3 reps;20 seconds;Other (comment)    Active Hamstring Stretch Limitations With belt and active dorsiflexion    Gastroc Stretch --      Knee/Hip Exercises: Aerobic   Recumbent Bike Seat 6 for 8 minutes      Knee/Hip Exercises: Machines for Strengthening   Cybex Leg Press 56# 2 sets of 10 slow eccentrics      Knee/Hip Exercises: Seated   Long Arc Quad Strengthening;Both;3 sets;5 reps;Other (comment)    Long Arc Quad Limitations Seated straight leg raises    Sit to [employer] 2 sets;5 reps;without UE support;Other (comment)   slow eccentrics                 PT Education - 01/10/21 1310    Education Details Reviewed HEP with emphasis on seated straight leg raises and sit to stand.    Person(s) Educated Patient    Methods Explanation;Demonstration;Tactile cues;Verbal cues    Comprehension Verbalized understanding;Returned demonstration;Verbal cues required;Tactile cues required;Need further instruction            PT Short Term Goals - 01/10/21 1348      PT SHORT TERM GOAL #1   Title Patient will demonstrate independent use of home exercise program to maintain progress from in clinic treatments.    Baseline 4/1: min cues needed today    Time 3    Period Weeks    Status Achieved    Target Date 01/10/21             PT Long Term Goals - 01/10/21 1348      PT LONG TERM GOAL #1   Title Patient will demonstrate/report pain at worst less than or equal to 2/10 to facilitate minimal limitation in daily activity secondary to pain symptoms.    Time 8    Period Weeks    Status On-going      PT LONG TERM GOAL #2   Title Patient will demonstrate  independent use of home exercise program to facilitate ability to maintain/progress functional gains from skilled physical therapy services.    Time 8    Period Weeks    Status On-going      PT LONG TERM GOAL #3   Title Pt. will demonstrate Lt LE MMT 5/5 throughout to facilitate ability to perform standing, walking, reciprocal stair navigation at PLOF.    Time 8  Period Weeks    Status On-going      PT LONG TERM GOAL #4   Title Pt. will demonstrate FOTO outcome > = 58% to indicated reduced disability due to symptoms.    Time 8    Period Weeks    Status On-going      PT LONG TERM GOAL #5   Title Pt. will demonstrate Lt knee AROM 0-110 deg to facilitate transfers, ambulation, stair/squat movmeent s restriction due to symptoms.    Baseline -4 to 108 01/10/2021    Time 8    Period Weeks    Status On-going      PT LONG TERM GOAL #6   Title Pt. will demonstrate independent ambulation community distances safely.    Time 8    Period Weeks    Status On-going      PT LONG TERM GOAL #7   Title Pt. will demonstrate bilateral SLS testing > or= 15 seconds to reduced fall risk.    Time 8    Period Weeks    Status On-going                 Plan - 01/10/21 1349    Clinical Impression Statement AROM is actually quite good (-4 to 108 degrees actively).  Quadriceps fatugue quickly with standing, walking and therapeutic exercises.  Added step-up and over for curbs and stairs as poor eccentric quadriceps strength is noted.  Continue POC to improve WB function and meet LTGs.    Personal Factors and Comorbidities Comorbidity 3+    Comorbidities bilateral THA (L: 03/2017, R: 08/2019), chronic renal disease II, HTN, obesity, and R knee OA.    Examination-Activity Limitations Squat;Sleep;Bed Mobility;Bend;Stairs;Stand;Transfers;Locomotion Level    Examination-Participation Restrictions Community Activity;Shop;Meal Prep;Cleaning    Stability/Clinical Decision Making Stable/Uncomplicated     Rehab Potential Fair    PT Frequency 2x / week    PT Duration 8 weeks    PT Treatment/Interventions ADLs/Self Care Home Management;Cryotherapy;Electrical Stimulation;Iontophoresis 4mg /ml Dexamethasone;Moist Heat;Balance training;Therapeutic exercise;Therapeutic activities;Functional mobility training;Stair training;Gait training;DME Instruction;Ultrasound;Neuromuscular re-education;Patient/family education;Passive range of motion;Joint Manipulations;Spinal Manipulations;Dry needling;Taping;Vasopneumatic Device;Manual techniques    PT Next Visit Plan AROM, quadriceps strength, balance    PT Home Exercise Plan [id number]    Consulted and Agree with Plan of Care Patient           Patient will benefit from skilled therapeutic intervention in order to improve the following deficits and impairments:  Abnormal gait,Decreased endurance,Hypomobility,Increased edema,Decreased activity tolerance,Decreased strength,Pain,Increased fascial restricitons,Difficulty walking,Decreased mobility,Decreased balance,Decreased range of motion,Impaired perceived functional ability,Improper body mechanics,Impaired flexibility,Decreased coordination  Visit Diagnosis: Difficulty in walking, not elsewhere classified  Muscle weakness (generalized)  Stiffness of left knee, not elsewhere classified  Chronic pain of left knee  Localized edema     Problem List Patient Active Problem List   Diagnosis Date Noted  . Dyspepsia 01/01/2021  . Atherosclerosis of aorta ([place]) 01/01/2021  . Diastolic CHF, chronic ([place]) 12/18/2020  . Quadriceps weakness 12/06/2020  . S/P total knee arthroplasty, left 03/09/2020  . History of total hip arthroplasty, right 08/21/2019  . Intermittent claudication ([place]) 07/08/2019  . Hypertensive nephropathy 12/15/2018  . Chronic renal disease, stage II 12/15/2018  . H/O total hip arthroplasty, left 11/07/2017  . Presence of left artificial hip joint 05/14/2017  . Preop cardiovascular exam  03/12/2017  . Cough   . Abdominal pain   . Symptomatic cholelithiasis 07/01/2016  . Nonspecific ST-T wave electrocardiographic changes   . Lower leg edema 02/16/2015  . Obesity (BMI 30.0-34.9)  02/16/2015  . Essential hypertension 01/11/2012  . Hyperthyroidism 01/11/2012  . Hypercholesteremia 01/11/2012  . H/O vaginal hysterectomy 1974 01/11/2012    Class: History of  . S/P removal of lung  partial R lobe  2010 01/11/2012  . History of hernia repair  R ing. 1975 01/11/2012  . BACK PAIN 11/12/2007  . DYSPNEA 11/11/2007    Farley Ly PT, MPT 01/10/2021, 1:53 PM  [facility] Physical Therapy [address] [place], [place], [address] Phone: [phone]   Fax:  [phone]  Name: [name] MRN: [id number] Date of Birth: 02-Jun-1933

## 2021-01-12 ENCOUNTER — Ambulatory Visit (INDEPENDENT_AMBULATORY_CARE_PROVIDER_SITE_OTHER): Payer: Medicare Other | Admitting: Physical Therapy

## 2021-01-12 ENCOUNTER — Other Ambulatory Visit: Payer: Self-pay

## 2021-01-12 DIAGNOSIS — M25562 Pain in left knee: Secondary | ICD-10-CM | POA: Diagnosis not present

## 2021-01-12 DIAGNOSIS — G8929 Other chronic pain: Secondary | ICD-10-CM

## 2021-01-12 DIAGNOSIS — R262 Difficulty in walking, not elsewhere classified: Secondary | ICD-10-CM

## 2021-01-12 DIAGNOSIS — M6281 Muscle weakness (generalized): Secondary | ICD-10-CM

## 2021-01-12 DIAGNOSIS — R600 Localized edema: Secondary | ICD-10-CM | POA: Diagnosis not present

## 2021-01-12 DIAGNOSIS — M25662 Stiffness of left knee, not elsewhere classified: Secondary | ICD-10-CM

## 2021-01-12 NOTE — Therapy (Signed)
[place], [place], [address] Phone: [phone]   Fax:  [phone]  Physical Therapy Treatment  Patient Details  Name: [name] MRN: [id number] Date of Birth: September 22, 1933 Referring Provider (PT): Dr. Lorin Mercy   Encounter Date: 01/12/2021   PT End of Session - 01/12/21 1333    Visit Number 4    Number of Visits 16    Date for PT Re-Evaluation 02/14/21    Authorization Type Medicare    Progress Note Due on Visit 10    PT Start Time 1259    PT Stop Time 1337    PT Time Calculation (min) 38 min    Activity Tolerance Patient tolerated treatment well;Patient limited by fatigue    Behavior During Therapy [facility] for tasks assessed/performed           Past Medical History:  Diagnosis Date  . Aortic stenosis    Echo 07/05/16: Very mild AS, Mean gradient 10 mm Hg, Peak gradient (S) 19 mmHg   . Arthritis    left hip and back  . Bronchiectasis    isolated to RML; status post right middle lobe partial resection.  . Complication of anesthesia    hard to wake up  . Dyspnea   . Gall stones   . GERD (gastroesophageal reflux disease)    history   . H/O osteoporosis   . Hypertension   . Hyperthyroidism    endocrinologist - Dr. Chalmers Cater  . Pneumonia   . Yeast infection     Past Surgical History:  Procedure Laterality Date  . ABDOMINAL HYSTERECTOMY  1974  . CHOLECYSTECTOMY N/A 07/02/2016   Procedure: LAPAROSCOPIC CHOLECYSTECTOMY;  Surgeon: Stark Klein, MD;  Location: [place];  Service: General;  Laterality: N/A;  . COLONOSCOPY    . ENDOSCOPIC RETROGRADE CHOLANGIOPANCREATOGRAPHY (ERCP) WITH PROPOFOL N/A 08/07/2018   Procedure: ENDOSCOPIC RETROGRADE CHOLANGIOPANCREATOGRAPHY (ERCP) WITH PROPOFOL;  Surgeon: Carol Ada, MD;  [facility];  Service: Endoscopy;  Laterality: N/A;  . EYE SURGERY     stye removed  . HARDWARE REMOVAL Left 02/24/2020   Procedure: Hardware Removal, left knee;  Surgeon: [name], MD;   Location: [place];  Service: Orthopedics;  Laterality: Left;  . HERNIA REPAIR  1975  . KNEE SURGERY Left   . LUNG REMOVAL, PARTIAL  208   RML  . NM MYOVIEW LTD  06/2011   Normal EF. No ischemia or infarction.  [name]  08/07/2018   Procedure: [name];  Surgeon: Carol Ada, MD;  [facility];  Service: Endoscopy;;  balloon sweep  . STERIOD INJECTION Right 02/24/2020   Procedure: RIGHT KNEE STEROID INTRA-ARTICULAR MARCAINE/DEPO-MEDROL INJECTION UNDER ANESTHESIA;  Surgeon: [name], MD;  Location: [place];  Service: Orthopedics;  Laterality: Right;  . TOTAL HIP ARTHROPLASTY Left 04/05/2017   Procedure: LEFT TOTAL HIP ARTHROPLASTY ANTERIOR APPROACH;  Surgeon: [name], MD;  Location: [place];  Service: Orthopedics;  Laterality: Left;  . TOTAL HIP ARTHROPLASTY Right 12/19/2018  . TOTAL HIP ARTHROPLASTY Right 12/19/2018   Procedure: RIGHT TOTAL HIP ARTHROPLASTY-DIRECT ANTERIOR APPROACH;  Surgeon: [name], MD;  Location: [place];  Service: Orthopedics;  Laterality: Right;  . TOTAL KNEE ARTHROPLASTY Left 02/24/2020   Procedure: LEFT TOTAL KNEE ARTHROPLASTY;  Surgeon: [name], MD;  Location: [place];  Service: Orthopedics;  Laterality: Left;  . TRANSTHORACIC ECHOCARDIOGRAM  06/2011   Mild concentric LVH. Normal EF with impaired relaxation. Mildly elevated RV pressures of 30 and 40  mmHg.  Marland Kitchen TRANSTHORACIC ECHOCARDIOGRAM  06/2016   normal LV size and function. EF 60-65% with GRD 2 DD. Mild aortic stenosis. Mild LA dilation. Mild to moderately increased PA pressures (46 mmHg).  . TUBAL LIGATION      There were no vitals filed for this visit.   Subjective Assessment - 01/12/21 1317    Subjective [name] reports her knee is feeling good, not in any pain, overall feels better and less tired upon arrival than she has been feeling    Limitations Standing;Walking;Sitting;House hold activities    Patient Stated Goals Reduce pain, get leg straight    Currently in Pain?  No/denies    Pain Onset More than a month ago            [facility] Adult PT Treatment/Exercise - 01/12/21 0001      Knee/Hip Exercises: Stretches   Gastroc Stretch Both;2 reps;30 seconds      Knee/Hip Exercises: Aerobic   Recumbent Bike Seat 6 for 8 minutes      Knee/Hip Exercises: Machines for Strengthening   Cybex Leg Press 56# 3 sets of 10 slow eccentrics      Knee/Hip Exercises: Standing   Knee Flexion Both;10 reps    Knee Flexion Limitations bilat UE support 2 ;bs    Hip Abduction Both;10 reps;Knee straight    Abduction Limitations bil UE support, 2 lbs    Hip Extension Both;10 reps;Knee straight    Extension Limitations bil UE support, 2 lbs    Lateral Step Up Limitations Step-up and over 4" step (focus on bending knee and slow eccentrics)      Knee/Hip Exercises: Seated   Long Arc Quad Both;2 sets;10 reps    Long Arc Quad Weight 2 lbs.    Sit to [employer] 2 sets;5 reps;Other (comment);without UE support   down slow without hands                   PT Short Term Goals - 01/10/21 1348      PT SHORT TERM GOAL #1   Title Patient will demonstrate independent use of home exercise program to maintain progress from in clinic treatments.    Baseline 4/1: min cues needed today    Time 3    Period Weeks    Status Achieved    Target Date 01/10/21             PT Long Term Goals - 01/10/21 1348      PT LONG TERM GOAL #1   Title Patient will demonstrate/report pain at worst less than or equal to 2/10 to facilitate minimal limitation in daily activity secondary to pain symptoms.    Time 8    Period Weeks    Status On-going      PT LONG TERM GOAL #2   Title Patient will demonstrate independent use of home exercise program to facilitate ability to maintain/progress functional gains from skilled physical therapy services.    Time 8    Period Weeks    Status On-going      PT LONG TERM GOAL #3   Title Pt. will demonstrate Lt LE MMT 5/5 throughout to facilitate ability  to perform standing, walking, reciprocal stair navigation at PLOF.    Time 8    Period Weeks    Status On-going      PT LONG TERM GOAL #4   Title Pt. will demonstrate FOTO outcome > = 58% to indicated reduced disability due to symptoms.  Time 8    Period Weeks    Status On-going      PT LONG TERM GOAL #5   Title Pt. will demonstrate Lt knee AROM 0-110 deg to facilitate transfers, ambulation, stair/squat movmeent s restriction due to symptoms.    Baseline -4 to 108 01/10/2021    Time 8    Period Weeks    Status On-going      PT LONG TERM GOAL #6   Title Pt. will demonstrate independent ambulation community distances safely.    Time 8    Period Weeks    Status On-going      PT LONG TERM GOAL #7   Title Pt. will demonstrate bilateral SLS testing > or= 15 seconds to reduced fall risk.    Time 8    Period Weeks    Status On-going                 Plan - 01/12/21 1334    Clinical Impression Statement Focused on overall leg strength and endurance with good overall tolerance but she does fatigue easily and requires rest breaks. We will continue to work to progress her functional abilites as able.    Personal Factors and Comorbidities Comorbidity 3+    Comorbidities bilateral THA (L: 03/2017, R: 08/2019), chronic renal disease II, HTN, obesity, and R knee OA.    Examination-Activity Limitations Squat;Sleep;Bed Mobility;Bend;Stairs;Stand;Transfers;Locomotion Level    Examination-Participation Restrictions Community Activity;Shop;Meal Prep;Cleaning    Stability/Clinical Decision Making Stable/Uncomplicated    Rehab Potential Fair    PT Frequency 2x / week    PT Duration 8 weeks    PT Treatment/Interventions ADLs/Self Care Home Management;Cryotherapy;Electrical Stimulation;Iontophoresis 4mg /ml Dexamethasone;Moist Heat;Balance training;Therapeutic exercise;Therapeutic activities;Functional mobility training;Stair training;Gait training;DME Instruction;Ultrasound;Neuromuscular  re-education;Patient/family education;Passive range of motion;Joint Manipulations;Spinal Manipulations;Dry needling;Taping;Vasopneumatic Device;Manual techniques    PT Next Visit Plan AROM, quadriceps strength, balance    PT Home Exercise Plan [id number]    Consulted and Agree with Plan of Care Patient           Patient will benefit from skilled therapeutic intervention in order to improve the following deficits and impairments:  Abnormal gait,Decreased endurance,Hypomobility,Increased edema,Decreased activity tolerance,Decreased strength,Pain,Increased fascial restricitons,Difficulty walking,Decreased mobility,Decreased balance,Decreased range of motion,Impaired perceived functional ability,Improper body mechanics,Impaired flexibility,Decreased coordination  Visit Diagnosis: Difficulty in walking, not elsewhere classified  Muscle weakness (generalized)  Stiffness of left knee, not elsewhere classified  Chronic pain of left knee  Localized edema     Problem List Patient Active Problem List   Diagnosis Date Noted  . Dyspepsia 01/01/2021  . Atherosclerosis of aorta ([place]) 01/01/2021  . Diastolic CHF, chronic ([place]) 12/18/2020  . Quadriceps weakness 12/06/2020  . S/P total knee arthroplasty, left 03/09/2020  . History of total hip arthroplasty, right 08/21/2019  . Intermittent claudication ([place]) 07/08/2019  . Hypertensive nephropathy 12/15/2018  . Chronic renal disease, stage II 12/15/2018  . H/O total hip arthroplasty, left 11/07/2017  . Presence of left artificial hip joint 05/14/2017  . Preop cardiovascular exam 03/12/2017  . Cough   . Abdominal pain   . Symptomatic cholelithiasis 07/01/2016  . Nonspecific ST-T wave electrocardiographic changes   . Lower leg edema 02/16/2015  . Obesity (BMI 30.0-34.9) 02/16/2015  . Essential hypertension 01/11/2012  . Hyperthyroidism 01/11/2012  . Hypercholesteremia 01/11/2012  . H/O vaginal hysterectomy 1974 01/11/2012    Class: History  of  . S/P removal of lung  partial R lobe  2010 01/11/2012  . History of hernia repair  R ing. 1975 01/11/2012  .  BACK PAIN 11/12/2007  . DYSPNEA 11/11/2007    Silvestre Mesi 01/12/2021, 1:36 PM  [facility] Physical Therapy [address] [place], [place], [address] Phone: [phone]   Fax:  [phone]  Name: [name] MRN: [id number] Date of Birth: 1932/12/25

## 2021-01-17 ENCOUNTER — Encounter: Payer: Medicare Other | Admitting: Rehabilitative and Restorative Service Providers"

## 2021-01-17 ENCOUNTER — Telehealth: Payer: Self-pay | Admitting: Rehabilitative and Restorative Service Providers"

## 2021-01-17 NOTE — Telephone Encounter (Signed)
Called and spoke with patient after no show for appointment today. Pt. Indicated thinking it was at 1 pm not 11:45am.   Reminded of next appointment time on Thursday 1pm.  Scot Jun, PT, DPT, OCS, ATC 01/17/21  12:03 PM

## 2021-01-19 ENCOUNTER — Other Ambulatory Visit: Payer: Self-pay

## 2021-01-19 ENCOUNTER — Encounter: Payer: Self-pay | Admitting: Internal Medicine

## 2021-01-19 ENCOUNTER — Ambulatory Visit (INDEPENDENT_AMBULATORY_CARE_PROVIDER_SITE_OTHER): Payer: Medicare Other | Admitting: Rehabilitative and Restorative Service Providers"

## 2021-01-19 ENCOUNTER — Encounter: Payer: Self-pay | Admitting: Rehabilitative and Restorative Service Providers"

## 2021-01-19 DIAGNOSIS — M25562 Pain in left knee: Secondary | ICD-10-CM | POA: Diagnosis not present

## 2021-01-19 DIAGNOSIS — R600 Localized edema: Secondary | ICD-10-CM

## 2021-01-19 DIAGNOSIS — M25662 Stiffness of left knee, not elsewhere classified: Secondary | ICD-10-CM

## 2021-01-19 DIAGNOSIS — M6281 Muscle weakness (generalized): Secondary | ICD-10-CM | POA: Diagnosis not present

## 2021-01-19 DIAGNOSIS — G8929 Other chronic pain: Secondary | ICD-10-CM | POA: Diagnosis not present

## 2021-01-19 DIAGNOSIS — R262 Difficulty in walking, not elsewhere classified: Secondary | ICD-10-CM

## 2021-01-19 NOTE — Therapy (Signed)
[place], [place], [address] Phone: [phone]   Fax:  [phone]  Physical Therapy Treatment  Patient Details  Name: [name] MRN: [id number] Date of Birth: 27-Jun-1933 Referring Provider (PT): Dr. Lorin Mercy   Encounter Date: 01/19/2021   PT End of Session - 01/19/21 1435    Visit Number 5    Number of Visits 16    Date for PT Re-Evaluation 02/14/21    Authorization Type Medicare    Progress Note Due on Visit 10    PT Start Time 1300    PT Stop Time 1345    PT Time Calculation (min) 45 min    Activity Tolerance Patient tolerated treatment well;Patient limited by fatigue    Behavior During Therapy [facility] for tasks assessed/performed           Past Medical History:  Diagnosis Date  . Aortic stenosis    Echo 07/05/16: Very mild AS, Mean gradient 10 mm Hg, Peak gradient (S) 19 mmHg   . Arthritis    left hip and back  . Bronchiectasis    isolated to RML; status post right middle lobe partial resection.  . Complication of anesthesia    hard to wake up  . Dyspnea   . Gall stones   . GERD (gastroesophageal reflux disease)    history   . H/O osteoporosis   . Hypertension   . Hyperthyroidism    endocrinologist - Dr. Chalmers Cater  . Pneumonia   . Yeast infection     Past Surgical History:  Procedure Laterality Date  . ABDOMINAL HYSTERECTOMY  1974  . CHOLECYSTECTOMY N/A 07/02/2016   Procedure: LAPAROSCOPIC CHOLECYSTECTOMY;  Surgeon: Stark Klein, MD;  Location: [place];  Service: General;  Laterality: N/A;  . COLONOSCOPY    . ENDOSCOPIC RETROGRADE CHOLANGIOPANCREATOGRAPHY (ERCP) WITH PROPOFOL N/A 08/07/2018   Procedure: ENDOSCOPIC RETROGRADE CHOLANGIOPANCREATOGRAPHY (ERCP) WITH PROPOFOL;  Surgeon: Carol Ada, MD;  [facility];  Service: Endoscopy;  Laterality: N/A;  . EYE SURGERY     stye removed  . HARDWARE REMOVAL Left 02/24/2020   Procedure: Hardware Removal, left knee;  Surgeon: [name], MD;   Location: [place];  Service: Orthopedics;  Laterality: Left;  . HERNIA REPAIR  1975  . KNEE SURGERY Left   . LUNG REMOVAL, PARTIAL  208   RML  . NM MYOVIEW LTD  06/2011   Normal EF. No ischemia or infarction.  [name]  08/07/2018   Procedure: [name];  Surgeon: Carol Ada, MD;  [facility];  Service: Endoscopy;;  balloon sweep  . STERIOD INJECTION Right 02/24/2020   Procedure: RIGHT KNEE STEROID INTRA-ARTICULAR MARCAINE/DEPO-MEDROL INJECTION UNDER ANESTHESIA;  Surgeon: [name], MD;  Location: [place];  Service: Orthopedics;  Laterality: Right;  . TOTAL HIP ARTHROPLASTY Left 04/05/2017   Procedure: LEFT TOTAL HIP ARTHROPLASTY ANTERIOR APPROACH;  Surgeon: [name], MD;  Location: [place];  Service: Orthopedics;  Laterality: Left;  . TOTAL HIP ARTHROPLASTY Right 12/19/2018  . TOTAL HIP ARTHROPLASTY Right 12/19/2018   Procedure: RIGHT TOTAL HIP ARTHROPLASTY-DIRECT ANTERIOR APPROACH;  Surgeon: [name], MD;  Location: [place];  Service: Orthopedics;  Laterality: Right;  . TOTAL KNEE ARTHROPLASTY Left 02/24/2020   Procedure: LEFT TOTAL KNEE ARTHROPLASTY;  Surgeon: [name], MD;  Location: [place];  Service: Orthopedics;  Laterality: Left;  . TRANSTHORACIC ECHOCARDIOGRAM  06/2011   Mild concentric LVH. Normal EF with impaired relaxation. Mildly elevated RV pressures of 30 and 40  mmHg.  Marland Kitchen TRANSTHORACIC ECHOCARDIOGRAM  06/2016   normal LV size and function. EF 60-65% with GRD 2 DD. Mild aortic stenosis. Mild LA dilation. Mild to moderately increased PA pressures (46 mmHg).  . TUBAL LIGATION      There were no vitals filed for this visit.   Subjective Assessment - 01/19/21 1326    Subjective [name] notes better gait at home without her cane.  She is a bit apprehensive with steps and stairs.    Limitations Standing;Walking;Sitting;House hold activities    Patient Stated Goals Reduce pain, get leg straight    Currently in Pain? Yes    Pain Score 2     Pain  Location Knee    Pain Orientation Left    Pain Descriptors / Indicators Tightness;Sore;Aching    Pain Type Chronic pain    Pain Onset More than a month ago    Pain Frequency Intermittent    Aggravating Factors  First thing in the morning and with overuse    Pain Relieving Factors Loosens up with movement    Effect of Pain on Daily Activities Limited WB endurance (30 minutes)    Multiple Pain Sites No              [facility] PT Assessment - 01/19/21 0001      AROM   Left Knee Extension 0    Left Knee Flexion 110                         [facility] Adult PT Treatment/Exercise - 01/19/21 0001      Therapeutic Activites    Therapeutic Activities ADL's    ADL's Step-up and over and up and back 6" and 8" step (focus on bending knee and slow eccentrics)      Exercises   Exercises Knee/Hip      Knee/Hip Exercises: Stretches   Active Hamstring Stretch Both;3 reps;20 seconds;Other (comment)    Active Hamstring Stretch Limitations With belt and active dorsiflexion      Knee/Hip Exercises: Aerobic   Recumbent Bike Seat 6 for 8 minutes      Knee/Hip Exercises: Machines for Strengthening   Cybex Leg Press 75# DL 10X and single leg (R & L) 2 sets of 10 50# slow eccentrics      Knee/Hip Exercises: Seated   Long Arc Quad Strengthening;Both;3 sets;5 reps;Other (comment)    Long Arc Quad Limitations Seated straight leg raises    Sit to [employer] 2 sets;5 reps;without UE support;Other (comment)   slow eccentrics     Knee/Hip Exercises: Supine   Knee Flexion AAROM;Left;1 set;10 reps;Limitations    Knee Flexion Limitations 10 seconds with belt                  PT Education - 01/19/21 1433    Education Details Reviewed HEP with emphasis on quadriceps strength and knee flexion AROM.    Person(s) Educated Patient    Methods Explanation;Demonstration;Tactile cues;Verbal cues    Comprehension Returned demonstration;Verbalized understanding;Need further instruction;Verbal cues  required;Tactile cues required            PT Short Term Goals - 01/19/21 1433      PT SHORT TERM GOAL #1   Title Patient will demonstrate independent use of home exercise program to maintain progress from in clinic treatments.    Baseline 4/1: min cues needed today    Time 3    Period Weeks    Status Achieved  Target Date 01/10/21             PT Long Term Goals - 01/19/21 1433      PT LONG TERM GOAL #1   Title Patient will demonstrate/report pain at worst less than or equal to 2/10 to facilitate minimal limitation in daily activity secondary to pain symptoms.    Time 8    Period Weeks    Status On-going      PT LONG TERM GOAL #2   Title Patient will demonstrate independent use of home exercise program to facilitate ability to maintain/progress functional gains from skilled physical therapy services.    Time 8    Period Weeks    Status On-going      PT LONG TERM GOAL #3   Title Pt. will demonstrate Lt LE MMT 5/5 throughout to facilitate ability to perform standing, walking, reciprocal stair navigation at PLOF.    Time 8    Period Weeks    Status On-going      PT LONG TERM GOAL #4   Title Pt. will demonstrate FOTO outcome > = 58% to indicated reduced disability due to symptoms.    Baseline 60 (was 42)    Time 8    Period Weeks    Status Achieved      PT LONG TERM GOAL #5   Title Pt. will demonstrate Lt knee AROM 0-110 deg to facilitate transfers, ambulation, stair/squat movmeent s restriction due to symptoms.    Baseline -4 to 108 01/10/2021    Time 8    Period Weeks    Status Achieved      PT LONG TERM GOAL #6   Title Pt. will demonstrate independent ambulation community distances safely.    Time 8    Period Weeks    Status On-going      PT LONG TERM GOAL #7   Title Pt. will demonstrate bilateral SLS testing > or= 15 seconds to reduced fall risk.    Time 8    Period Weeks    Status On-going                 Plan - 01/19/21 1435    Clinical  Impression Statement [name] has met AROM and self-reported functional goals established at evaluation.  Endurance, edema with prolonged weight-bearing and fatigue will benefit from continued strength work.    Personal Factors and Comorbidities Comorbidity 3+    Comorbidities bilateral THA (L: 03/2017, R: 08/2019), chronic renal disease II, HTN, obesity, and R knee OA.    Examination-Activity Limitations Squat;Sleep;Bed Mobility;Bend;Stairs;Stand;Transfers;Locomotion Level    Examination-Participation Restrictions Community Activity;Shop;Meal Prep;Cleaning    Stability/Clinical Decision Making Stable/Uncomplicated    Rehab Potential Fair    PT Frequency 2x / week    PT Duration 8 weeks    PT Treatment/Interventions ADLs/Self Care Home Management;Cryotherapy;Electrical Stimulation;Iontophoresis 74m/ml Dexamethasone;Moist Heat;Balance training;Therapeutic exercise;Therapeutic activities;Functional mobility training;Stair training;Gait training;DME Instruction;Ultrasound;Neuromuscular re-education;Patient/family education;Passive range of motion;Joint Manipulations;Spinal Manipulations;Dry needling;Taping;Vasopneumatic Device;Manual techniques    PT Next Visit Plan AROM, quadriceps strength, balance    PT Home Exercise Plan [id number]    Consulted and Agree with Plan of Care Patient           Patient will benefit from skilled therapeutic intervention in order to improve the following deficits and impairments:  Abnormal gait,Decreased endurance,Hypomobility,Increased edema,Decreased activity tolerance,Decreased strength,Pain,Increased fascial restricitons,Difficulty walking,Decreased mobility,Decreased balance,Decreased range of motion,Impaired perceived functional ability,Improper body mechanics,Impaired flexibility,Decreased coordination  Visit Diagnosis: Difficulty in walking, not elsewhere classified  Muscle weakness (  generalized)  Stiffness of left knee, not elsewhere classified  Chronic pain  of left knee  Localized edema     Problem List Patient Active Problem List   Diagnosis Date Noted  . Dyspepsia 01/01/2021  . Atherosclerosis of aorta ([place]) 01/01/2021  . Diastolic CHF, chronic ([place]) 12/18/2020  . Quadriceps weakness 12/06/2020  . S/P total knee arthroplasty, left 03/09/2020  . History of total hip arthroplasty, right 08/21/2019  . Intermittent claudication ([place]) 07/08/2019  . Hypertensive nephropathy 12/15/2018  . Chronic renal disease, stage II 12/15/2018  . H/O total hip arthroplasty, left 11/07/2017  . Presence of left artificial hip joint 05/14/2017  . Preop cardiovascular exam 03/12/2017  . Cough   . Abdominal pain   . Symptomatic cholelithiasis 07/01/2016  . Nonspecific ST-T wave electrocardiographic changes   . Lower leg edema 02/16/2015  . Obesity (BMI 30.0-34.9) 02/16/2015  . Essential hypertension 01/11/2012  . Hyperthyroidism 01/11/2012  . Hypercholesteremia 01/11/2012  . H/O vaginal hysterectomy 1974 01/11/2012    Class: History of  . S/P removal of lung  partial R lobe  2010 01/11/2012  . History of hernia repair  R ing. 1975 01/11/2012  . BACK PAIN 11/12/2007  . DYSPNEA 11/11/2007    Farley Ly PT, MPT 01/19/2021, 2:37 PM  [facility] Physical Therapy [address] [place], [place], [address] Phone: [phone]   Fax:  [phone]  Name: [name] MRN: [id number] Date of Birth: 05/03/1933

## 2021-01-24 ENCOUNTER — Ambulatory Visit (INDEPENDENT_AMBULATORY_CARE_PROVIDER_SITE_OTHER): Payer: Medicare Other | Admitting: Rehabilitative and Restorative Service Providers"

## 2021-01-24 ENCOUNTER — Encounter: Payer: Self-pay | Admitting: Rehabilitative and Restorative Service Providers"

## 2021-01-24 ENCOUNTER — Other Ambulatory Visit: Payer: Self-pay

## 2021-01-24 DIAGNOSIS — M25562 Pain in left knee: Secondary | ICD-10-CM

## 2021-01-24 DIAGNOSIS — G8929 Other chronic pain: Secondary | ICD-10-CM

## 2021-01-24 DIAGNOSIS — M6281 Muscle weakness (generalized): Secondary | ICD-10-CM

## 2021-01-24 DIAGNOSIS — R262 Difficulty in walking, not elsewhere classified: Secondary | ICD-10-CM | POA: Diagnosis not present

## 2021-01-24 DIAGNOSIS — M25662 Stiffness of left knee, not elsewhere classified: Secondary | ICD-10-CM

## 2021-01-24 DIAGNOSIS — R600 Localized edema: Secondary | ICD-10-CM | POA: Diagnosis not present

## 2021-01-24 NOTE — Therapy (Signed)
[place], [place], [address] Phone: [phone]   Fax:  [phone]  Physical Therapy Treatment  Patient Details  Name: [name] MRN: [id number] Date of Birth: Feb 07, 1933 Referring Provider (PT): Dr. Lorin Mercy   Encounter Date: 01/24/2021   PT End of Session - 01/24/21 1303    Visit Number 6    Number of Visits 16    Date for PT Re-Evaluation 02/14/21    Authorization Type Medicare    Progress Note Due on Visit 10    PT Start Time 1257    PT Stop Time 1336    PT Time Calculation (min) 39 min    Activity Tolerance Patient tolerated treatment well;Patient limited by fatigue    Behavior During Therapy [facility] for tasks assessed/performed           Past Medical History:  Diagnosis Date  . Aortic stenosis    Echo 07/05/16: Very mild AS, Mean gradient 10 mm Hg, Peak gradient (S) 19 mmHg   . Arthritis    left hip and back  . Bronchiectasis    isolated to RML; status post right middle lobe partial resection.  . Complication of anesthesia    hard to wake up  . Dyspnea   . Gall stones   . GERD (gastroesophageal reflux disease)    history   . H/O osteoporosis   . Hypertension   . Hyperthyroidism    endocrinologist - Dr. Chalmers Cater  . Pneumonia   . Yeast infection     Past Surgical History:  Procedure Laterality Date  . ABDOMINAL HYSTERECTOMY  1974  . CHOLECYSTECTOMY N/A 07/02/2016   Procedure: LAPAROSCOPIC CHOLECYSTECTOMY;  Surgeon: Stark Klein, MD;  Location: [place];  Service: General;  Laterality: N/A;  . COLONOSCOPY    . ENDOSCOPIC RETROGRADE CHOLANGIOPANCREATOGRAPHY (ERCP) WITH PROPOFOL N/A 08/07/2018   Procedure: ENDOSCOPIC RETROGRADE CHOLANGIOPANCREATOGRAPHY (ERCP) WITH PROPOFOL;  Surgeon: Carol Ada, MD;  [facility];  Service: Endoscopy;  Laterality: N/A;  . EYE SURGERY     stye removed  . HARDWARE REMOVAL Left 02/24/2020   Procedure: Hardware Removal, left knee;  Surgeon: [name], MD;   Location: [place];  Service: Orthopedics;  Laterality: Left;  . HERNIA REPAIR  1975  . KNEE SURGERY Left   . LUNG REMOVAL, PARTIAL  208   RML  . NM MYOVIEW LTD  06/2011   Normal EF. No ischemia or infarction.  [name]  08/07/2018   Procedure: [name];  Surgeon: Carol Ada, MD;  [facility];  Service: Endoscopy;;  balloon sweep  . STERIOD INJECTION Right 02/24/2020   Procedure: RIGHT KNEE STEROID INTRA-ARTICULAR MARCAINE/DEPO-MEDROL INJECTION UNDER ANESTHESIA;  Surgeon: [name], MD;  Location: [place];  Service: Orthopedics;  Laterality: Right;  . TOTAL HIP ARTHROPLASTY Left 04/05/2017   Procedure: LEFT TOTAL HIP ARTHROPLASTY ANTERIOR APPROACH;  Surgeon: [name], MD;  Location: [place];  Service: Orthopedics;  Laterality: Left;  . TOTAL HIP ARTHROPLASTY Right 12/19/2018  . TOTAL HIP ARTHROPLASTY Right 12/19/2018   Procedure: RIGHT TOTAL HIP ARTHROPLASTY-DIRECT ANTERIOR APPROACH;  Surgeon: [name], MD;  Location: [place];  Service: Orthopedics;  Laterality: Right;  . TOTAL KNEE ARTHROPLASTY Left 02/24/2020   Procedure: LEFT TOTAL KNEE ARTHROPLASTY;  Surgeon: [name], MD;  Location: [place];  Service: Orthopedics;  Laterality: Left;  . TRANSTHORACIC ECHOCARDIOGRAM  06/2011   Mild concentric LVH. Normal EF with impaired relaxation. Mildly elevated RV pressures of 30 and 40  mmHg.  Marland Kitchen TRANSTHORACIC ECHOCARDIOGRAM  06/2016   normal LV size and function. EF 60-65% with GRD 2 DD. Mild aortic stenosis. Mild LA dilation. Mild to moderately increased PA pressures (46 mmHg).  . TUBAL LIGATION      There were no vitals filed for this visit.   Subjective Assessment - 01/24/21 1301    Subjective Pt. stated stiffness as chief complaint, noted when she tried to get moving.  Pt. stated no pain upon arrival today, just stiffness.  Cramping happens at times and can be painful.    Limitations Standing;Walking;Sitting;House hold activities    Patient Stated Goals Reduce  pain, get leg straight    Currently in Pain? No/denies    Pain Location Knee    Pain Orientation Left    Pain Descriptors / Indicators Tightness;Other (Comment)   stiffness   Pain Onset More than a month ago    Pain Frequency Intermittent    Aggravating Factors  stiffness c static positioning    Pain Relieving Factors moving some helps              [facility] PT Assessment - 01/24/21 0001      Assessment   Medical Diagnosis LE weakness s/p Lt TKA    Referring Provider (PT) Dr. Lorin Mercy    Onset Date/Surgical Date 02/24/20      Single Leg Stance   Comments Lt SLS 5 seconds, Rt SLS 15 seconds                         [facility] Adult PT Treatment/Exercise - 01/24/21 0001      Neuro Re-ed    Neuro Re-ed Details  Lt SLS 15 sec moderate hand touch assist prn x 5 bilateral, tandem ambulation on foam in bars 8 ft x 10 forward, tandem stance on foam occassional HHA 1 min bilateral      Knee/Hip Exercises: Aerobic   Recumbent Bike Seat 6 full revolutions 6 mins      Knee/Hip Exercises: Machines for Strengthening   Cybex Leg Press Lt single leg 50 lbs 3 x 10      Knee/Hip Exercises: Standing   Forward Step Up Step Height: 6";2 sets;10 reps;Left;Hand Hold: 2      Knee/Hip Exercises: Seated   Other Seated Knee/Hip Exercises seated SLR x 15 Lt    Sit to Sand 10 reps;without UE support   18 inch chair                   PT Short Term Goals - 01/19/21 1433      PT SHORT TERM GOAL #1   Title Patient will demonstrate independent use of home exercise program to maintain progress from in clinic treatments.    Baseline 4/1: min cues needed today    Time 3    Period Weeks    Status Achieved    Target Date 01/10/21             PT Long Term Goals - 01/19/21 1433      PT LONG TERM GOAL #1   Title Patient will demonstrate/report pain at worst less than or equal to 2/10 to facilitate minimal limitation in daily activity secondary to pain symptoms.    Time 8    Period  Weeks    Status On-going      PT LONG TERM GOAL #2   Title Patient will demonstrate independent use of home exercise program to facilitate ability to maintain/progress  functional gains from skilled physical therapy services.    Time 8    Period Weeks    Status On-going      PT LONG TERM GOAL #3   Title Pt. will demonstrate Lt LE MMT 5/5 throughout to facilitate ability to perform standing, walking, reciprocal stair navigation at PLOF.    Time 8    Period Weeks    Status On-going      PT LONG TERM GOAL #4   Title Pt. will demonstrate FOTO outcome > = 58% to indicated reduced disability due to symptoms.    Baseline 60 (was 42)    Time 8    Period Weeks    Status Achieved      PT LONG TERM GOAL #5   Title Pt. will demonstrate Lt knee AROM 0-110 deg to facilitate transfers, ambulation, stair/squat movmeent s restriction due to symptoms.    Baseline -4 to 108 01/10/2021    Time 8    Period Weeks    Status Achieved      PT LONG TERM GOAL #6   Title Pt. will demonstrate independent ambulation community distances safely.    Time 8    Period Weeks    Status On-going      PT LONG TERM GOAL #7   Title Pt. will demonstrate bilateral SLS testing > or= 15 seconds to reduced fall risk.    Time 8    Period Weeks    Status On-going                 Plan - 01/24/21 1327    Clinical Impression Statement Additional focus today spent on movement coordination deficits to improve functional stabilty in ambulation and reduced fall risk as well as continued strengthening program.  No adverse reactions noted during intervention today.    Personal Factors and Comorbidities Comorbidity 3+    Comorbidities bilateral THA (L: 03/2017, R: 08/2019), chronic renal disease II, HTN, obesity, and R knee OA.    Examination-Activity Limitations Squat;Sleep;Bed Mobility;Bend;Stairs;Stand;Transfers;Locomotion Level    Examination-Participation Restrictions Community Activity;Shop;Meal Prep;Cleaning     Stability/Clinical Decision Making Stable/Uncomplicated    Rehab Potential Fair    PT Frequency 2x / week    PT Duration 8 weeks    PT Treatment/Interventions ADLs/Self Care Home Management;Cryotherapy;Electrical Stimulation;Iontophoresis 4mg /ml Dexamethasone;Moist Heat;Balance training;Therapeutic exercise;Therapeutic activities;Functional mobility training;Stair training;Gait training;DME Instruction;Ultrasound;Neuromuscular re-education;Patient/family education;Passive range of motion;Joint Manipulations;Spinal Manipulations;Dry needling;Taping;Vasopneumatic Device;Manual techniques    PT Next Visit Plan Strong recommendation for static and dynamic balance on compliant and non compliant surfaces to address balance/movement coordination deficits while continued strengthening intervention is performed.    PT Home Exercise Plan [id number]    Consulted and Agree with Plan of Care Patient           Patient will benefit from skilled therapeutic intervention in order to improve the following deficits and impairments:  Abnormal gait,Decreased endurance,Hypomobility,Increased edema,Decreased activity tolerance,Decreased strength,Pain,Increased fascial restricitons,Difficulty walking,Decreased mobility,Decreased balance,Decreased range of motion,Impaired perceived functional ability,Improper body mechanics,Impaired flexibility,Decreased coordination  Visit Diagnosis: Chronic pain of left knee  Muscle weakness (generalized)  Difficulty in walking, not elsewhere classified  Stiffness of left knee, not elsewhere classified  Localized edema     Problem List Patient Active Problem List   Diagnosis Date Noted  . Dyspepsia 01/01/2021  . Atherosclerosis of aorta ([place]) 01/01/2021  . Diastolic CHF, chronic ([place]) 12/18/2020  . Quadriceps weakness 12/06/2020  . S/P total knee arthroplasty, left 03/09/2020  . History of total hip arthroplasty, right 08/21/2019  .  Intermittent claudication ([place])  07/08/2019  . Hypertensive nephropathy 12/15/2018  . Chronic renal disease, stage II 12/15/2018  . H/O total hip arthroplasty, left 11/07/2017  . Presence of left artificial hip joint 05/14/2017  . Preop cardiovascular exam 03/12/2017  . Cough   . Abdominal pain   . Symptomatic cholelithiasis 07/01/2016  . Nonspecific ST-T wave electrocardiographic changes   . Lower leg edema 02/16/2015  . Obesity (BMI 30.0-34.9) 02/16/2015  . Essential hypertension 01/11/2012  . Hyperthyroidism 01/11/2012  . Hypercholesteremia 01/11/2012  . H/O vaginal hysterectomy 1974 01/11/2012    Class: History of  . S/P removal of lung  partial R lobe  2010 01/11/2012  . History of hernia repair  R ing. 1975 01/11/2012  . BACK PAIN 11/12/2007  . DYSPNEA 11/11/2007   Scot Jun, PT, DPT, OCS, ATC 01/24/21  1:32 PM    [facility] [address] [place], [place], [address] Phone: [phone]   Fax:  [phone]  Name: [name] MRN: [id number] Date of Birth: 05/06/1933

## 2021-01-26 ENCOUNTER — Other Ambulatory Visit: Payer: Self-pay

## 2021-01-26 ENCOUNTER — Ambulatory Visit (INDEPENDENT_AMBULATORY_CARE_PROVIDER_SITE_OTHER): Payer: Medicare Other | Admitting: Rehabilitative and Restorative Service Providers"

## 2021-01-26 ENCOUNTER — Encounter: Payer: Self-pay | Admitting: Rehabilitative and Restorative Service Providers"

## 2021-01-26 DIAGNOSIS — R262 Difficulty in walking, not elsewhere classified: Secondary | ICD-10-CM

## 2021-01-26 DIAGNOSIS — G8929 Other chronic pain: Secondary | ICD-10-CM

## 2021-01-26 DIAGNOSIS — R600 Localized edema: Secondary | ICD-10-CM | POA: Diagnosis not present

## 2021-01-26 DIAGNOSIS — M25662 Stiffness of left knee, not elsewhere classified: Secondary | ICD-10-CM

## 2021-01-26 DIAGNOSIS — M6281 Muscle weakness (generalized): Secondary | ICD-10-CM | POA: Diagnosis not present

## 2021-01-26 DIAGNOSIS — M25562 Pain in left knee: Secondary | ICD-10-CM | POA: Diagnosis not present

## 2021-01-26 NOTE — Therapy (Signed)
[place], [place], [address] Phone: [phone]   Fax:  [phone]  Physical Therapy Treatment  Patient Details  Name: [name] MRN: [id number] Date of Birth: 07-02-1933 Referring Provider (PT): Dr. Lorin Mercy   Encounter Date: 01/26/2021   PT End of Session - 01/26/21 1147    Visit Number 7    Number of Visits 16    Date for PT Re-Evaluation 02/14/21    Authorization Type Medicare    Progress Note Due on Visit 10    PT Start Time 1140    PT Stop Time 1219    PT Time Calculation (min) 39 min    Activity Tolerance Patient tolerated treatment well;Patient limited by fatigue    Behavior During Therapy [facility] for tasks assessed/performed           Past Medical History:  Diagnosis Date  . Aortic stenosis    Echo 07/05/16: Very mild AS, Mean gradient 10 mm Hg, Peak gradient (S) 19 mmHg   . Arthritis    left hip and back  . Bronchiectasis    isolated to RML; status post right middle lobe partial resection.  . Complication of anesthesia    hard to wake up  . Dyspnea   . Gall stones   . GERD (gastroesophageal reflux disease)    history   . H/O osteoporosis   . Hypertension   . Hyperthyroidism    endocrinologist - Dr. Chalmers Cater  . Pneumonia   . Yeast infection     Past Surgical History:  Procedure Laterality Date  . ABDOMINAL HYSTERECTOMY  1974  . CHOLECYSTECTOMY N/A 07/02/2016   Procedure: LAPAROSCOPIC CHOLECYSTECTOMY;  Surgeon: Stark Klein, MD;  Location: [place];  Service: General;  Laterality: N/A;  . COLONOSCOPY    . ENDOSCOPIC RETROGRADE CHOLANGIOPANCREATOGRAPHY (ERCP) WITH PROPOFOL N/A 08/07/2018   Procedure: ENDOSCOPIC RETROGRADE CHOLANGIOPANCREATOGRAPHY (ERCP) WITH PROPOFOL;  Surgeon: Carol Ada, MD;  [facility];  Service: Endoscopy;  Laterality: N/A;  . EYE SURGERY     stye removed  . HARDWARE REMOVAL Left 02/24/2020   Procedure: Hardware Removal, left knee;  Surgeon: [name], MD;   Location: [place];  Service: Orthopedics;  Laterality: Left;  . HERNIA REPAIR  1975  . KNEE SURGERY Left   . LUNG REMOVAL, PARTIAL  208   RML  . NM MYOVIEW LTD  06/2011   Normal EF. No ischemia or infarction.  [name]  08/07/2018   Procedure: [name];  Surgeon: Carol Ada, MD;  [facility];  Service: Endoscopy;;  balloon sweep  . STERIOD INJECTION Right 02/24/2020   Procedure: RIGHT KNEE STEROID INTRA-ARTICULAR MARCAINE/DEPO-MEDROL INJECTION UNDER ANESTHESIA;  Surgeon: [name], MD;  Location: [place];  Service: Orthopedics;  Laterality: Right;  . TOTAL HIP ARTHROPLASTY Left 04/05/2017   Procedure: LEFT TOTAL HIP ARTHROPLASTY ANTERIOR APPROACH;  Surgeon: [name], MD;  Location: [place];  Service: Orthopedics;  Laterality: Left;  . TOTAL HIP ARTHROPLASTY Right 12/19/2018  . TOTAL HIP ARTHROPLASTY Right 12/19/2018   Procedure: RIGHT TOTAL HIP ARTHROPLASTY-DIRECT ANTERIOR APPROACH;  Surgeon: [name], MD;  Location: [place];  Service: Orthopedics;  Laterality: Right;  . TOTAL KNEE ARTHROPLASTY Left 02/24/2020   Procedure: LEFT TOTAL KNEE ARTHROPLASTY;  Surgeon: [name], MD;  Location: [place];  Service: Orthopedics;  Laterality: Left;  . TRANSTHORACIC ECHOCARDIOGRAM  06/2011   Mild concentric LVH. Normal EF with impaired relaxation. Mildly elevated RV pressures of 30 and 40  mmHg.  Marland Kitchen TRANSTHORACIC ECHOCARDIOGRAM  06/2016   normal LV size and function. EF 60-65% with GRD 2 DD. Mild aortic stenosis. Mild LA dilation. Mild to moderately increased PA pressures (46 mmHg).  . TUBAL LIGATION      There were no vitals filed for this visit.   Subjective Assessment - 01/26/21 1148    Subjective Pt. stated back of knee stiffness in morning as chief complaint today.  Reported no specific pain upon arrival, just stiffness.    Limitations Standing;Walking;Sitting;House hold activities    Patient Stated Goals Reduce pain, get leg straight    Currently in Pain?  No/denies    Pain Score 0-No pain    Pain Location Knee    Pain Orientation Left    Pain Descriptors / Indicators Tightness    Pain Type Chronic pain    Pain Onset More than a month ago    Pain Frequency Intermittent    Aggravating Factors  static positioning stiffness                             [facility] Adult PT Treatment/Exercise - 01/26/21 0001      Neuro Re-ed    Neuro Re-ed Details  Lt SLS 15 sec moderate hand touch assist prn x 5 bilateral, heel/toe raises c focus on minimal hand assist 2 x 10 each      Knee/Hip Exercises: Stretches   Passive Hamstring Stretch 20 seconds;3 reps;Left    Passive Hamstring Stretch Limitations --   seated c strap     Knee/Hip Exercises: Aerobic   Recumbent Bike seat 5 full revolutions 9 mins      Knee/Hip Exercises: Machines for Strengthening   Cybex Knee Extension Lt leg eccentric lowering 2 x10 10 lbs    Cybex Leg Press --   held today due to application of extension machine     Knee/Hip Exercises: Standing   Hip Abduction 10 reps;Both    Hip Extension Both;10 reps      Knee/Hip Exercises: Seated   Other Seated Knee/Hip Exercises seated slr 2 x 10 Lt                    PT Short Term Goals - 01/19/21 1433      PT SHORT TERM GOAL #1   Title Patient will demonstrate independent use of home exercise program to maintain progress from in clinic treatments.    Baseline 4/1: min cues needed today    Time 3    Period Weeks    Status Achieved    Target Date 01/10/21             PT Long Term Goals - 01/19/21 1433      PT LONG TERM GOAL #1   Title Patient will demonstrate/report pain at worst less than or equal to 2/10 to facilitate minimal limitation in daily activity secondary to pain symptoms.    Time 8    Period Weeks    Status On-going      PT LONG TERM GOAL #2   Title Patient will demonstrate independent use of home exercise program to facilitate ability to maintain/progress functional gains from  skilled physical therapy services.    Time 8    Period Weeks    Status On-going      PT LONG TERM GOAL #3   Title Pt. will demonstrate Lt LE MMT 5/5 throughout to facilitate ability to perform standing, walking,  reciprocal stair navigation at PLOF.    Time 8    Period Weeks    Status On-going      PT LONG TERM GOAL #4   Title Pt. will demonstrate FOTO outcome > = 58% to indicated reduced disability due to symptoms.    Baseline 60 (was 42)    Time 8    Period Weeks    Status Achieved      PT LONG TERM GOAL #5   Title Pt. will demonstrate Lt knee AROM 0-110 deg to facilitate transfers, ambulation, stair/squat movmeent s restriction due to symptoms.    Baseline -4 to 108 01/10/2021    Time 8    Period Weeks    Status Achieved      PT LONG TERM GOAL #6   Title Pt. will demonstrate independent ambulation community distances safely.    Time 8    Period Weeks    Status On-going      PT LONG TERM GOAL #7   Title Pt. will demonstrate bilateral SLS testing > or= 15 seconds to reduced fall risk.    Time 8    Period Weeks    Status On-going                 Plan - 01/26/21 1158    Clinical Impression Statement To help improve stair navigation, continued strengthening for quad in open and closed chain activity indicated c continued application of balance intervention.    Personal Factors and Comorbidities Comorbidity 3+    Comorbidities bilateral THA (L: 03/2017, R: 08/2019), chronic renal disease II, HTN, obesity, and R knee OA.    Examination-Activity Limitations Squat;Sleep;Bed Mobility;Bend;Stairs;Stand;Transfers;Locomotion Level    Examination-Participation Restrictions Community Activity;Shop;Meal Prep;Cleaning    Stability/Clinical Decision Making Stable/Uncomplicated    Rehab Potential Fair    PT Frequency 2x / week    PT Duration 8 weeks    PT Treatment/Interventions ADLs/Self Care Home Management;Cryotherapy;Electrical Stimulation;Iontophoresis 4mg /ml  Dexamethasone;Moist Heat;Balance training;Therapeutic exercise;Therapeutic activities;Functional mobility training;Stair training;Gait training;DME Instruction;Ultrasound;Neuromuscular re-education;Patient/family education;Passive range of motion;Joint Manipulations;Spinal Manipulations;Dry needling;Taping;Vasopneumatic Device;Manual techniques    PT Next Visit Plan Static and dynamic balance on compliant and non compliant surfaces to address balance/movement coordination deficits while continued strengthening intervention is performed.    PT Home Exercise Plan [id number]    Consulted and Agree with Plan of Care Patient           Patient will benefit from skilled therapeutic intervention in order to improve the following deficits and impairments:  Abnormal gait,Decreased endurance,Hypomobility,Increased edema,Decreased activity tolerance,Decreased strength,Pain,Increased fascial restricitons,Difficulty walking,Decreased mobility,Decreased balance,Decreased range of motion,Impaired perceived functional ability,Improper body mechanics,Impaired flexibility,Decreased coordination  Visit Diagnosis: Chronic pain of left knee  Muscle weakness (generalized)  Difficulty in walking, not elsewhere classified  Stiffness of left knee, not elsewhere classified  Localized edema     Problem List Patient Active Problem List   Diagnosis Date Noted  . Dyspepsia 01/01/2021  . Atherosclerosis of aorta ([place]) 01/01/2021  . Diastolic CHF, chronic ([place]) 12/18/2020  . Quadriceps weakness 12/06/2020  . S/P total knee arthroplasty, left 03/09/2020  . History of total hip arthroplasty, right 08/21/2019  . Intermittent claudication ([place]) 07/08/2019  . Hypertensive nephropathy 12/15/2018  . Chronic renal disease, stage II 12/15/2018  . H/O total hip arthroplasty, left 11/07/2017  . Presence of left artificial hip joint 05/14/2017  . Preop cardiovascular exam 03/12/2017  . Cough   . Abdominal pain   .  Symptomatic cholelithiasis 07/01/2016  . Nonspecific ST-T wave electrocardiographic  changes   . Lower leg edema 02/16/2015  . Obesity (BMI 30.0-34.9) 02/16/2015  . Essential hypertension 01/11/2012  . Hyperthyroidism 01/11/2012  . Hypercholesteremia 01/11/2012  . H/O vaginal hysterectomy 1974 01/11/2012    Class: History of  . S/P removal of lung  partial R lobe  2010 01/11/2012  . History of hernia repair  R ing. 1975 01/11/2012  . BACK PAIN 11/12/2007  . DYSPNEA 11/11/2007    Scot Jun, PT, DPT, OCS, ATC 01/26/21  12:19 PM    [facility] [address] [place], [place], [address] Phone: [phone]   Fax:  [phone]  Name: [name] MRN: [id number] Date of Birth: 1933/09/08

## 2021-01-31 ENCOUNTER — Encounter: Payer: Self-pay | Admitting: Physical Therapy

## 2021-01-31 ENCOUNTER — Other Ambulatory Visit: Payer: Self-pay

## 2021-01-31 ENCOUNTER — Ambulatory Visit (INDEPENDENT_AMBULATORY_CARE_PROVIDER_SITE_OTHER): Payer: Medicare Other | Admitting: Physical Therapy

## 2021-01-31 DIAGNOSIS — G8929 Other chronic pain: Secondary | ICD-10-CM | POA: Diagnosis not present

## 2021-01-31 DIAGNOSIS — M6281 Muscle weakness (generalized): Secondary | ICD-10-CM

## 2021-01-31 DIAGNOSIS — M25562 Pain in left knee: Secondary | ICD-10-CM

## 2021-01-31 DIAGNOSIS — M25662 Stiffness of left knee, not elsewhere classified: Secondary | ICD-10-CM

## 2021-01-31 DIAGNOSIS — R262 Difficulty in walking, not elsewhere classified: Secondary | ICD-10-CM | POA: Diagnosis not present

## 2021-01-31 DIAGNOSIS — R600 Localized edema: Secondary | ICD-10-CM | POA: Diagnosis not present

## 2021-01-31 NOTE — Therapy (Signed)
[place], [place], [address] Phone: [phone]   Fax:  [phone]  Physical Therapy Treatment  Patient Details  Name: [name] MRN: [id number] Date of Birth: 1933-07-08 Referring Provider (PT): Dr. Lorin Mercy   Encounter Date: 01/31/2021   PT End of Session - 01/31/21 1008    Visit Number 8    Number of Visits 16    Date for PT Re-Evaluation 02/14/21    Authorization Type Medicare    Progress Note Due on Visit 10    PT Start Time 0928    PT Stop Time 1008    PT Time Calculation (min) 40 min    Activity Tolerance Patient tolerated treatment well;Patient limited by fatigue    Behavior During Therapy [facility] for tasks assessed/performed           Past Medical History:  Diagnosis Date  . Aortic stenosis    Echo 07/05/16: Very mild AS, Mean gradient 10 mm Hg, Peak gradient (S) 19 mmHg   . Arthritis    left hip and back  . Bronchiectasis    isolated to RML; status post right middle lobe partial resection.  . Complication of anesthesia    hard to wake up  . Dyspnea   . Gall stones   . GERD (gastroesophageal reflux disease)    history   . H/O osteoporosis   . Hypertension   . Hyperthyroidism    endocrinologist - Dr. Chalmers Cater  . Pneumonia   . Yeast infection     Past Surgical History:  Procedure Laterality Date  . ABDOMINAL HYSTERECTOMY  1974  . CHOLECYSTECTOMY N/A 07/02/2016   Procedure: LAPAROSCOPIC CHOLECYSTECTOMY;  Surgeon: Stark Klein, MD;  Location: [place];  Service: General;  Laterality: N/A;  . COLONOSCOPY    . ENDOSCOPIC RETROGRADE CHOLANGIOPANCREATOGRAPHY (ERCP) WITH PROPOFOL N/A 08/07/2018   Procedure: ENDOSCOPIC RETROGRADE CHOLANGIOPANCREATOGRAPHY (ERCP) WITH PROPOFOL;  Surgeon: Carol Ada, MD;  [facility];  Service: Endoscopy;  Laterality: N/A;  . EYE SURGERY     stye removed  . HARDWARE REMOVAL Left 02/24/2020   Procedure: Hardware Removal, left knee;  Surgeon: [name], MD;   Location: [place];  Service: Orthopedics;  Laterality: Left;  . HERNIA REPAIR  1975  . KNEE SURGERY Left   . LUNG REMOVAL, PARTIAL  208   RML  . NM MYOVIEW LTD  06/2011   Normal EF. No ischemia or infarction.  [name]  08/07/2018   Procedure: [name];  Surgeon: Carol Ada, MD;  [facility];  Service: Endoscopy;;  balloon sweep  . STERIOD INJECTION Right 02/24/2020   Procedure: RIGHT KNEE STEROID INTRA-ARTICULAR MARCAINE/DEPO-MEDROL INJECTION UNDER ANESTHESIA;  Surgeon: [name], MD;  Location: [place];  Service: Orthopedics;  Laterality: Right;  . TOTAL HIP ARTHROPLASTY Left 04/05/2017   Procedure: LEFT TOTAL HIP ARTHROPLASTY ANTERIOR APPROACH;  Surgeon: [name], MD;  Location: [place];  Service: Orthopedics;  Laterality: Left;  . TOTAL HIP ARTHROPLASTY Right 12/19/2018  . TOTAL HIP ARTHROPLASTY Right 12/19/2018   Procedure: RIGHT TOTAL HIP ARTHROPLASTY-DIRECT ANTERIOR APPROACH;  Surgeon: [name], MD;  Location: [place];  Service: Orthopedics;  Laterality: Right;  . TOTAL KNEE ARTHROPLASTY Left 02/24/2020   Procedure: LEFT TOTAL KNEE ARTHROPLASTY;  Surgeon: [name], MD;  Location: [place];  Service: Orthopedics;  Laterality: Left;  . TRANSTHORACIC ECHOCARDIOGRAM  06/2011   Mild concentric LVH. Normal EF with impaired relaxation. Mildly elevated RV pressures of 30 and 40  mmHg.  Marland Kitchen TRANSTHORACIC ECHOCARDIOGRAM  06/2016   normal LV size and function. EF 60-65% with GRD 2 DD. Mild aortic stenosis. Mild LA dilation. Mild to moderately increased PA pressures (46 mmHg).  . TUBAL LIGATION      There were no vitals filed for this visit.   Subjective Assessment - 01/31/21 0931    Subjective doing well, no pain today.    Limitations Standing;Walking;Sitting;House hold activities    Patient Stated Goals Reduce pain, get leg straight    Currently in Pain? No/denies                             [facility] Adult PT Treatment/Exercise - 01/31/21  0001      Knee/Hip Exercises: Stretches   Passive Hamstring Stretch Both;3 reps;30 seconds   seated with strap     Knee/Hip Exercises: Aerobic   Recumbent Bike seat 5 L1 x 9 min      Knee/Hip Exercises: Machines for Strengthening   Cybex Knee Extension Lt leg eccentric lowering 3 x10 10 lbs    Cybex Leg Press Lt single leg 56 lbs 3 x 10      Knee/Hip Exercises: Standing   Heel Raises Both;20 reps    Heel Raises Limitations with toe raises    SLS 3x15 sec bil with intermittent UE support                    PT Short Term Goals - 01/19/21 1433      PT SHORT TERM GOAL #1   Title Patient will demonstrate independent use of home exercise program to maintain progress from in clinic treatments.    Baseline 4/1: min cues needed today    Time 3    Period Weeks    Status Achieved    Target Date 01/10/21             PT Long Term Goals - 01/19/21 1433      PT LONG TERM GOAL #1   Title Patient will demonstrate/report pain at worst less than or equal to 2/10 to facilitate minimal limitation in daily activity secondary to pain symptoms.    Time 8    Period Weeks    Status On-going      PT LONG TERM GOAL #2   Title Patient will demonstrate independent use of home exercise program to facilitate ability to maintain/progress functional gains from skilled physical therapy services.    Time 8    Period Weeks    Status On-going      PT LONG TERM GOAL #3   Title Pt. will demonstrate Lt LE MMT 5/5 throughout to facilitate ability to perform standing, walking, reciprocal stair navigation at PLOF.    Time 8    Period Weeks    Status On-going      PT LONG TERM GOAL #4   Title Pt. will demonstrate FOTO outcome > = 58% to indicated reduced disability due to symptoms.    Baseline 60 (was 42)    Time 8    Period Weeks    Status Achieved      PT LONG TERM GOAL #5   Title Pt. will demonstrate Lt knee AROM 0-110 deg to facilitate transfers, ambulation, stair/squat movmeent s  restriction due to symptoms.    Baseline -4 to 108 01/10/2021    Time 8    Period Weeks    Status Achieved  PT LONG TERM GOAL #6   Title Pt. will demonstrate independent ambulation community distances safely.    Time 8    Period Weeks    Status On-going      PT LONG TERM GOAL #7   Title Pt. will demonstrate bilateral SLS testing > or= 15 seconds to reduced fall risk.    Time 8    Period Weeks    Status On-going                 Plan - 01/31/21 1008    Clinical Impression Statement Pt feels overall improvement since starting PT, and would like to hold PT after her next visit to follow up with the MD.  Anticipate d/c next visit.    Personal Factors and Comorbidities Comorbidity 3+    Comorbidities bilateral THA (L: 03/2017, R: 08/2019), chronic renal disease II, HTN, obesity, and R knee OA.    Examination-Activity Limitations Squat;Sleep;Bed Mobility;Bend;Stairs;Stand;Transfers;Locomotion Level    Examination-Participation Restrictions Community Activity;Shop;Meal Prep;Cleaning    Stability/Clinical Decision Making Stable/Uncomplicated    Rehab Potential Fair    PT Frequency 2x / week    PT Duration 8 weeks    PT Treatment/Interventions ADLs/Self Care Home Management;Cryotherapy;Electrical Stimulation;Iontophoresis 4mg /ml Dexamethasone;Moist Heat;Balance training;Therapeutic exercise;Therapeutic activities;Functional mobility training;Stair training;Gait training;DME Instruction;Ultrasound;Neuromuscular re-education;Patient/family education;Passive range of motion;Joint Manipulations;Spinal Manipulations;Dry needling;Taping;Vasopneumatic Device;Manual techniques    PT Next Visit Plan Static and dynamic balance on compliant and non compliant surfaces to address balance/movement coordination deficits while continued strengthening intervention is performed.; check goals, plan to hold    PT Home Exercise Plan [email or web address]    Consulted and Agree with Plan of Care Patient            Patient will benefit from skilled therapeutic intervention in order to improve the following deficits and impairments:  Abnormal gait,Decreased endurance,Hypomobility,Increased edema,Decreased activity tolerance,Decreased strength,Pain,Increased fascial restricitons,Difficulty walking,Decreased mobility,Decreased balance,Decreased range of motion,Impaired perceived functional ability,Improper body mechanics,Impaired flexibility,Decreased coordination  Visit Diagnosis: Chronic pain of left knee  Muscle weakness (generalized)  Difficulty in walking, not elsewhere classified  Stiffness of left knee, not elsewhere classified  Localized edema     Problem List Patient Active Problem List   Diagnosis Date Noted  . Dyspepsia 01/01/2021  . Atherosclerosis of aorta ([place]) 01/01/2021  . Diastolic CHF, chronic ([place]) 12/18/2020  . Quadriceps weakness 12/06/2020  . S/P total knee arthroplasty, left 03/09/2020  . History of total hip arthroplasty, right 08/21/2019  . Intermittent claudication ([place]) 07/08/2019  . Hypertensive nephropathy 12/15/2018  . Chronic renal disease, stage II 12/15/2018  . H/O total hip arthroplasty, left 11/07/2017  . Presence of left artificial hip joint 05/14/2017  . Preop cardiovascular exam 03/12/2017  . Cough   . Abdominal pain   . Symptomatic cholelithiasis 07/01/2016  . Nonspecific ST-T wave electrocardiographic changes   . Lower leg edema 02/16/2015  . Obesity (BMI 30.0-34.9) 02/16/2015  . Essential hypertension 01/11/2012  . Hyperthyroidism 01/11/2012  . Hypercholesteremia 01/11/2012  . H/O vaginal hysterectomy 1974 01/11/2012    Class: History of  . S/P removal of lung  partial R lobe  2010 01/11/2012  . History of hernia repair  R ing. 1975 01/11/2012  . BACK PAIN 11/12/2007  . DYSPNEA 11/11/2007     Laureen Abrahams, PT, DPT 01/31/21 10:12 AM    [facility] [address] [place], [place],  [address] Phone: [phone]   Fax:  [phone]  Name: [name] MRN: [id number] Date of Birth: [birth year]

## 2021-02-02 ENCOUNTER — Encounter: Payer: Self-pay | Admitting: Physical Therapy

## 2021-02-02 ENCOUNTER — Ambulatory Visit (INDEPENDENT_AMBULATORY_CARE_PROVIDER_SITE_OTHER): Payer: Medicare Other | Admitting: Physical Therapy

## 2021-02-02 ENCOUNTER — Other Ambulatory Visit: Payer: Self-pay

## 2021-02-02 DIAGNOSIS — R600 Localized edema: Secondary | ICD-10-CM | POA: Diagnosis not present

## 2021-02-02 DIAGNOSIS — M6281 Muscle weakness (generalized): Secondary | ICD-10-CM

## 2021-02-02 DIAGNOSIS — G8929 Other chronic pain: Secondary | ICD-10-CM | POA: Diagnosis not present

## 2021-02-02 DIAGNOSIS — M25562 Pain in left knee: Secondary | ICD-10-CM | POA: Diagnosis not present

## 2021-02-02 DIAGNOSIS — R262 Difficulty in walking, not elsewhere classified: Secondary | ICD-10-CM | POA: Diagnosis not present

## 2021-02-02 DIAGNOSIS — M25662 Stiffness of left knee, not elsewhere classified: Secondary | ICD-10-CM | POA: Diagnosis not present

## 2021-02-02 NOTE — Patient Instructions (Signed)
Access Code: [email or web address] URL: [email or web address] Date: 02/02/2021 Prepared by: Faustino Congress  Exercises Supine Hamstring Stretch with Strap - 2-3 x daily - 7 x weekly - 1 sets - 5 reps - 30 hold Gastroc Stretch on Wall - 2 x daily - 7 x weekly - 1 sets - 5 reps - 30 hold Seated Long Arc Quad - 2 x daily - 7 x weekly - 3 sets - 10 reps - 2 hold Seated Straight Leg Heel Taps - 2 x daily - 7 x weekly - 3 sets - 10 reps Sit to Stand - 2 x daily - 7 x weekly - 10 reps - 1 sets Sidelying Hip Abduction - 2 x daily - 7 x weekly - 1 sets - 10 reps

## 2021-02-02 NOTE — Therapy (Signed)
[place], [place], [address] Phone: [phone]   Fax:  [phone]  Physical Therapy Treatment/Discharge Summary  Patient Details  Name: [name] MRN: [id number] Date of Birth: [birth year] Referring Provider (PT): Dr. Lorin Mercy   Encounter Date: 02/02/2021   PT End of Session - 02/02/21 1216    Visit Number 9    Number of Visits 16    Date for PT Re-Evaluation 02/14/21    Authorization Type Medicare    Progress Note Due on Visit 10    PT Start Time 1142    PT Stop Time 1213    PT Time Calculation (min) 31 min    Activity Tolerance Patient tolerated treatment well;Patient limited by fatigue    Behavior During Therapy [facility] for tasks assessed/performed           Past Medical History:  Diagnosis Date  . Aortic stenosis    Echo 07/05/16: Very mild AS, Mean gradient 10 mm Hg, Peak gradient (S) 19 mmHg   . Arthritis    left hip and back  . Bronchiectasis    isolated to RML; status post right middle lobe partial resection.  . Complication of anesthesia    hard to wake up  . Dyspnea   . Gall stones   . GERD (gastroesophageal reflux disease)    history   . H/O osteoporosis   . Hypertension   . Hyperthyroidism    endocrinologist - Dr. Chalmers Cater  . Pneumonia   . Yeast infection     Past Surgical History:  Procedure Laterality Date  . ABDOMINAL HYSTERECTOMY  1974  . CHOLECYSTECTOMY N/A 07/02/2016   Procedure: LAPAROSCOPIC CHOLECYSTECTOMY;  Surgeon: Stark Klein, MD;  Location: [place];  Service: General;  Laterality: N/A;  . COLONOSCOPY    . ENDOSCOPIC RETROGRADE CHOLANGIOPANCREATOGRAPHY (ERCP) WITH PROPOFOL N/A 08/07/2018   Procedure: ENDOSCOPIC RETROGRADE CHOLANGIOPANCREATOGRAPHY (ERCP) WITH PROPOFOL;  Surgeon: Carol Ada, MD;  [facility];  Service: Endoscopy;  Laterality: N/A;  . EYE SURGERY     stye removed  . HARDWARE REMOVAL Left 02/24/2020   Procedure: Hardware Removal, left knee;  Surgeon:  [name], MD;  Location: [place];  Service: Orthopedics;  Laterality: Left;  . HERNIA REPAIR  1975  . KNEE SURGERY Left   . LUNG REMOVAL, PARTIAL  208   RML  . NM MYOVIEW LTD  06/2011   Normal EF. No ischemia or infarction.  [name]  08/07/2018   Procedure: [name];  Surgeon: Carol Ada, MD;  [facility];  Service: Endoscopy;;  balloon sweep  . STERIOD INJECTION Right 02/24/2020   Procedure: RIGHT KNEE STEROID INTRA-ARTICULAR MARCAINE/DEPO-MEDROL INJECTION UNDER ANESTHESIA;  Surgeon: [name], MD;  Location: [place];  Service: Orthopedics;  Laterality: Right;  . TOTAL HIP ARTHROPLASTY Left 04/05/2017   Procedure: LEFT TOTAL HIP ARTHROPLASTY ANTERIOR APPROACH;  Surgeon: [name], MD;  Location: [place];  Service: Orthopedics;  Laterality: Left;  . TOTAL HIP ARTHROPLASTY Right 12/19/2018  . TOTAL HIP ARTHROPLASTY Right 12/19/2018   Procedure: RIGHT TOTAL HIP ARTHROPLASTY-DIRECT ANTERIOR APPROACH;  Surgeon: [name], MD;  Location: [place];  Service: Orthopedics;  Laterality: Right;  . TOTAL KNEE ARTHROPLASTY Left 02/24/2020   Procedure: LEFT TOTAL KNEE ARTHROPLASTY;  Surgeon: [name], MD;  Location: [place];  Service: Orthopedics;  Laterality: Left;  . TRANSTHORACIC ECHOCARDIOGRAM  06/2011   Mild concentric LVH. Normal EF with impaired relaxation. Mildly elevated RV pressures of 30 and  40 mmHg.  Marland Kitchen TRANSTHORACIC ECHOCARDIOGRAM  06/2016   normal LV size and function. EF 60-65% with GRD 2 DD. Mild aortic stenosis. Mild LA dilation. Mild to moderately increased PA pressures (46 mmHg).  . TUBAL LIGATION      There were no vitals filed for this visit.   Subjective Assessment - 02/02/21 1146    Subjective knee still gets sore at times    Limitations Standing;Walking;Sitting;House hold activities    Patient Stated Goals Reduce pain, get leg straight    Currently in Pain? No/denies              [facility] PT Assessment - 02/02/21 1155      Assessment    Medical Diagnosis LE weakness s/p Lt TKA    Referring Provider (PT) Dr. Lorin Mercy    Onset Date/Surgical Date 02/24/20      Observation/Other Assessments   Focus on Therapeutic Outcomes (FOTO)  67      Single Leg Stance   Comments Lt SLS 12 seconds, Rt SLS 10 seconds      AROM   Left Knee Extension 0    Left Knee Flexion 110      Strength   Left Knee Flexion 5/5    Left Knee Extension 5/5                         [facility] Adult PT Treatment/Exercise - 02/02/21 1147      Knee/Hip Exercises: Aerobic   Recumbent Bike seat 5 L2 x 9 min      Knee/Hip Exercises: Seated   Long Arc Quad 10 reps;Left    Other Seated Knee/Hip Exercises seated slr  x10 Lt    Sit to Sand 10 reps;without UE support                  PT Education - 02/02/21 1216    Education Details finalized HEP to reflect what pt is doing at home    Person(s) Educated Patient    Methods Explanation;Demonstration    Comprehension Verbalized understanding            PT Short Term Goals - 01/19/21 1433      PT SHORT TERM GOAL #1   Title Patient will demonstrate independent use of home exercise program to maintain progress from in clinic treatments.    Baseline 4/1: min cues needed today    Time 3    Period Weeks    Status Achieved    Target Date 01/10/21             PT Long Term Goals - 02/02/21 1216      PT LONG TERM GOAL #1   Title Patient will demonstrate/report pain at worst less than or equal to 2/10 to facilitate minimal limitation in daily activity secondary to pain symptoms.    Time 8    Period Weeks    Status Achieved      PT LONG TERM GOAL #2   Title Patient will demonstrate independent use of home exercise program to facilitate ability to maintain/progress functional gains from skilled physical therapy services.    Time 8    Period Weeks    Status Achieved      PT LONG TERM GOAL #3   Title Pt. will demonstrate Lt LE MMT 5/5 throughout to facilitate ability to perform  standing, walking, reciprocal stair navigation at PLOF.    Time 8    Period Weeks  Status Achieved      PT LONG TERM GOAL #4   Title Pt. will demonstrate FOTO outcome > = 58% to indicated reduced disability due to symptoms.    Time 8    Period Weeks    Status Achieved      PT LONG TERM GOAL #5   Title Pt. will demonstrate Lt knee AROM 0-110 deg to facilitate transfers, ambulation, stair/squat movmeent s restriction due to symptoms.    Time 8    Period Weeks    Status Achieved      PT LONG TERM GOAL #6   Title Pt. will demonstrate independent ambulation community distances safely.    Time 8    Period Weeks    Status Achieved      PT LONG TERM GOAL #7   Title Pt. will demonstrate bilateral SLS testing > or= 15 seconds to reduced fall risk.    Time 8    Period Weeks    Status Not Met                 Plan - 02/02/21 1217    Clinical Impression Statement Pt has met 6/7 LTGs only not meeting SLS goal.  SLS improved from 5 sec to 12 sec on LLE today so overall improvement, just not to goal.  She is pleased with her progress, and reinforced need to continue with HEP to maximize progress and maintain gains made in PT.  PT verbalized understanding.  Will d/c PT today.    Personal Factors and Comorbidities Comorbidity 3+    Comorbidities bilateral THA (L: 03/2017, R: 08/2019), chronic renal disease II, HTN, obesity, and R knee OA.    Examination-Activity Limitations Squat;Sleep;Bed Mobility;Bend;Stairs;Stand;Transfers;Locomotion Level    Examination-Participation Restrictions Community Activity;Shop;Meal Prep;Cleaning    Stability/Clinical Decision Making Stable/Uncomplicated    Rehab Potential Fair    PT Frequency 2x / week    PT Duration 8 weeks    PT Treatment/Interventions ADLs/Self Care Home Management;Cryotherapy;Electrical Stimulation;Iontophoresis 30m/ml Dexamethasone;Moist Heat;Balance training;Therapeutic exercise;Therapeutic activities;Functional mobility  training;Stair training;Gait training;DME Instruction;Ultrasound;Neuromuscular re-education;Patient/family education;Passive range of motion;Joint Manipulations;Spinal Manipulations;Dry needling;Taping;Vasopneumatic Device;Manual techniques    PT Next Visit Plan d/c PT today.    PT Home Exercise Plan [email or web address]    Consulted and Agree with Plan of Care Patient           Patient will benefit from skilled therapeutic intervention in order to improve the following deficits and impairments:  Abnormal gait,Decreased endurance,Hypomobility,Increased edema,Decreased activity tolerance,Decreased strength,Pain,Increased fascial restricitons,Difficulty walking,Decreased mobility,Decreased balance,Decreased range of motion,Impaired perceived functional ability,Improper body mechanics,Impaired flexibility,Decreased coordination  Visit Diagnosis: Chronic pain of left knee  Muscle weakness (generalized)  Difficulty in walking, not elsewhere classified  Stiffness of left knee, not elsewhere classified  Localized edema     Problem List Patient Active Problem List   Diagnosis Date Noted  . Dyspepsia 01/01/2021  . Atherosclerosis of aorta ([place] 01/01/2021  . Diastolic CHF, chronic ([place] 12/18/2020  . Quadriceps weakness 12/06/2020  . S/P total knee arthroplasty, left 03/09/2020  . History of total hip arthroplasty, right 08/21/2019  . In[place]ittent claudication ([place] 07/08/2019  . Hypertensive nephropathy 12/15/2018  . Chronic renal disease, stage II 12/15/2018  . H/O total hip arthroplasty, left 11/07/2017  . Presence of left artificial hip joint 05/14/2017  . Preop cardiovascular exam 03/12/2017  . Cough   . Abdominal pain   . Symptomatic cholelithiasis 07/01/2016  . Nonspecific ST-T wave electrocardiographic changes   . Lower leg edema 02/16/2015  . Obesity (BMI  30.0-34.9) 02/16/2015  . Essential hypertension 01/11/2012  . Hyperthyroidism 01/11/2012  . Hypercholesteremia 01/11/2012  .  H/O vaginal hysterectomy 1974 01/11/2012    Class: History of  . S/P removal of lung  partial R lobe  2010 01/11/2012  . History of hernia repair  R ing. 1975 01/11/2012  . BACK PAIN 11/12/2007  . DYSPNEA 11/11/2007     Laureen Abrahams, PT, DPT 02/02/21 12:21 PM    [facility] [address] [place], [place], [address] Phone: [phone]   Fax:  [phone]  Name: [name] MRN: [id number] Date of Birth: 1932/10/14    PHYSICAL THERAPY DISCHARGE SUMMARY  Visits from Start of Care: 9  Current functional level related to goals / functional outcomes: See above   Remaining deficits: See above   Education / Equipment: HEP  Plan: Patient agrees to discharge.  Patient goals were partially met. Patient is being discharged due to meeting the stated rehab goals.  ?????     Laureen Abrahams, PT, DPT 02/02/21 12:21 PM  [facility] [address] [place], [place], [address] Phone: [phone]   Fax:  [phone]

## 2021-02-07 ENCOUNTER — Encounter: Payer: Self-pay | Admitting: Orthopaedic Surgery

## 2021-02-07 ENCOUNTER — Other Ambulatory Visit: Payer: Self-pay

## 2021-02-07 ENCOUNTER — Ambulatory Visit (INDEPENDENT_AMBULATORY_CARE_PROVIDER_SITE_OTHER): Payer: Medicare Other | Admitting: Orthopaedic Surgery

## 2021-02-07 VITALS — BP 123/73 | HR 59 | Ht 61.0 in | Wt 184.0 lb

## 2021-02-07 DIAGNOSIS — Z96652 Presence of left artificial knee joint: Secondary | ICD-10-CM

## 2021-02-07 NOTE — Progress Notes (Signed)
Office Visit Note   Patient: [name]           Date of Birth: [birth year]           MRN: [id number] Visit Date: 02/07/2021              Requested by: Glendale Chard, [address] [place],  [place] [address] PCP: Glendale Chard, MD   Assessment & Plan: Visit Diagnoses:  1. S/P total knee arthroplasty, left     Plan: Patient has good quad strength normal gait no limp and is happy with surgery result.  She states she can go up her stairs if her children are there without using the stair lift but they have encouraged her to use it if they are not there in case she has problems or stumbles.  She is ambulating in community without pain.  Follow-up here on an as-needed basis.  Follow-Up Instructions: No follow-ups on file.   Orders:  No orders of the defined types were placed in this encounter.  No orders of the defined types were placed in this encounter.     Procedures: No procedures performed   Clinical Data: No additional findings.   Subjective: Chief Complaint  Patient presents with  . Left Knee - Follow-up    HPI 87 year old female returns post left total knee arthroplasty now almost 1 year with surgery date 02/24/2020.  She was extremely behind therapy stopped and she had problems with quad weakness.  She is worked hard and always has had good flexion but lacks 15 degrees full extension now down to about 5 degrees.  She is walking better and can walk without a cane.  She is walking without a limp and is now happy with surgery result.  She had previous total hip and as I discussed with her total knee arthroplasty require significantly more effort on the patient's part to get a good result.  She is now happy with how she is doing.  Review of Systems All of systems updated unchanged.  Objective: Vital Signs: BP 123/73   Pulse (!) 59   Ht 5\' 1"  (1.549 m)   Wt 184 lb (83.5 kg)   BMI 34.77 kg/m   Physical Exam Constitutional:      Appearance:  She is well-developed.  HENT:     Head: Normocephalic.     Right Ear: External ear normal.     Left Ear: External ear normal.  Eyes:     Pupils: Pupils are equal, round, and reactive to light.  Neck:     Thyroid: No thyromegaly.     Trachea: No tracheal deviation.  Cardiovascular:     Rate and Rhythm: Normal rate.  Pulmonary:     Effort: Pulmonary effort is normal.  Abdominal:     Palpations: Abdomen is soft.  Skin:    General: Skin is warm and dry.  Neurological:     Mental Status: She is alert and oriented to person, place, and time.  Psychiatric:        Behavior: Behavior normal.     Ortho Exam left knee flexion almost 120 she lacks less than 5 degrees reaching full extension she ambulates with slightly short stride gait without limp.  She uses a cane for long distance.  She has good quad strength on manual testing.  Specialty Comments:  No specialty comments available.  Imaging: No results found.   PMFS History: Patient Active Problem List   Diagnosis Date Noted  .  Dyspepsia 01/01/2021  . Atherosclerosis of aorta ([place]) 01/01/2021  . Diastolic CHF, chronic ([place]) 12/18/2020  . Quadriceps weakness 12/06/2020  . S/P total knee arthroplasty, left 03/09/2020  . History of total hip arthroplasty, right 08/21/2019  . Intermittent claudication ([place]) 07/08/2019  . Hypertensive nephropathy 12/15/2018  . Chronic renal disease, stage II 12/15/2018  . H/O total hip arthroplasty, left 11/07/2017  . Presence of left artificial hip joint 05/14/2017  . Preop cardiovascular exam 03/12/2017  . Cough   . Abdominal pain   . Symptomatic cholelithiasis 07/01/2016  . Nonspecific ST-T wave electrocardiographic changes   . Lower leg edema 02/16/2015  . Obesity (BMI 30.0-34.9) 02/16/2015  . Essential hypertension 01/11/2012  . Hyperthyroidism 01/11/2012  . Hypercholesteremia 01/11/2012  . H/O vaginal hysterectomy 1974 01/11/2012    Class: History of  . S/P removal of lung  partial  R lobe  2010 01/11/2012  . History of hernia repair  R ing. 1975 01/11/2012  . BACK PAIN 11/12/2007  . DYSPNEA 11/11/2007   Past Medical History:  Diagnosis Date  . Aortic stenosis    Echo 07/05/16: Very mild AS, Mean gradient 10 mm Hg, Peak gradient (S) 19 mmHg   . Arthritis    left hip and back  . Bronchiectasis    isolated to RML; status post right middle lobe partial resection.  . Complication of anesthesia    hard to wake up  . Dyspnea   . Gall stones   . GERD (gastroesophageal reflux disease)    history   . H/O osteoporosis   . Hypertension   . Hyperthyroidism    endocrinologist - Dr. Chalmers Cater  . Pneumonia   . Yeast infection     Family History  Problem Relation Age of Onset  . Hypertension Mother   . CVA Mother 37  . Heart attack Sister   . Diabetes Sister   . Healthy Father   . Heart attack Brother   . Heart attack Brother     Past Surgical History:  Procedure Laterality Date  . ABDOMINAL HYSTERECTOMY  1974  . CHOLECYSTECTOMY N/A 07/02/2016   Procedure: LAPAROSCOPIC CHOLECYSTECTOMY;  Surgeon: Stark Klein, MD;  Location: [facility];  Service: General;  Laterality: N/A;  . COLONOSCOPY    . ENDOSCOPIC RETROGRADE CHOLANGIOPANCREATOGRAPHY (ERCP) WITH PROPOFOL N/A 08/07/2018   Procedure: ENDOSCOPIC RETROGRADE CHOLANGIOPANCREATOGRAPHY (ERCP) WITH PROPOFOL;  Surgeon: Carol Ada, MD;  Location: [facility];  Service: Endoscopy;  Laterality: N/A;  . EYE SURGERY     stye removed  . HARDWARE REMOVAL Left 02/24/2020   Procedure: Hardware Removal, left knee;  Surgeon: [name], MD;  Location: [facility];  Service: Orthopedics;  Laterality: Left;  . HERNIA REPAIR  1975  . KNEE SURGERY Left   . LUNG REMOVAL, PARTIAL  208   RML  . NM MYOVIEW LTD  06/2011   Normal EF. No ischemia or infarction.  [name]  08/07/2018   Procedure: [name];  Surgeon: Carol Ada, MD;  Location: [facility];  Service: Endoscopy;;  balloon sweep  . STERIOD INJECTION Right  02/24/2020   Procedure: RIGHT KNEE STEROID INTRA-ARTICULAR MARCAINE/DEPO-MEDROL INJECTION UNDER ANESTHESIA;  Surgeon: [name], MD;  Location: [facility];  Service: Orthopedics;  Laterality: Right;  . TOTAL HIP ARTHROPLASTY Left 04/05/2017   Procedure: LEFT TOTAL HIP ARTHROPLASTY ANTERIOR APPROACH;  Surgeon: [name], MD;  Location: [facility];  Service: Orthopedics;  Laterality: Left;  . TOTAL HIP ARTHROPLASTY Right 12/19/2018  . TOTAL HIP ARTHROPLASTY Right 12/19/2018  Procedure: RIGHT TOTAL HIP ARTHROPLASTY-DIRECT ANTERIOR APPROACH;  Surgeon: [name], MD;  Location: [facility];  Service: Orthopedics;  Laterality: Right;  . TOTAL KNEE ARTHROPLASTY Left 02/24/2020   Procedure: LEFT TOTAL KNEE ARTHROPLASTY;  Surgeon: [name], MD;  Location: [facility];  Service: Orthopedics;  Laterality: Left;  . TRANSTHORACIC ECHOCARDIOGRAM  06/2011   Mild concentric LVH. Normal EF with impaired relaxation. Mildly elevated RV pressures of 30 and 40 mmHg.  Marland Kitchen TRANSTHORACIC ECHOCARDIOGRAM  06/2016   normal LV size and function. EF 60-65% with GRD 2 DD. Mild aortic stenosis. Mild LA dilation. Mild to moderately increased PA pressures (46 mmHg).  . TUBAL LIGATION     Social History   Occupational History  . Occupation: retired  Tobacco Use  . Smoking status: Never Smoker  . Smokeless tobacco: Never Used  Vaping Use  . Vaping Use: Never used  Substance and Sexual Activity  . Alcohol use: No  . Drug use: No  . Sexual activity: Not Currently

## 2021-03-07 ENCOUNTER — Telehealth: Payer: Self-pay | Admitting: *Deleted

## 2021-03-07 NOTE — Telephone Encounter (Signed)
1 year post op call and survey completed.

## 2021-03-14 ENCOUNTER — Telehealth: Payer: Self-pay

## 2021-03-14 NOTE — Telephone Encounter (Signed)
I returned the pt's call and left a message with the name of the OB that she was referred to a few years ago, the name is Dr. Delsa Bern, [place] [phone].

## 2021-03-20 ENCOUNTER — Other Ambulatory Visit: Payer: Self-pay | Admitting: Internal Medicine

## 2021-03-29 ENCOUNTER — Ambulatory Visit: Payer: Medicare Other | Admitting: Nurse Practitioner

## 2021-04-12 DIAGNOSIS — N3941 Urge incontinence: Secondary | ICD-10-CM | POA: Diagnosis not present

## 2021-04-12 DIAGNOSIS — Z1211 Encounter for screening for malignant neoplasm of colon: Secondary | ICD-10-CM | POA: Diagnosis not present

## 2021-04-12 DIAGNOSIS — N3281 Overactive bladder: Secondary | ICD-10-CM | POA: Diagnosis not present

## 2021-04-12 DIAGNOSIS — Z1239 Encounter for other screening for malignant neoplasm of breast: Secondary | ICD-10-CM | POA: Diagnosis not present

## 2021-04-12 DIAGNOSIS — Z1231 Encounter for screening mammogram for malignant neoplasm of breast: Secondary | ICD-10-CM | POA: Diagnosis not present

## 2021-04-12 DIAGNOSIS — Z6839 Body mass index (BMI) 39.0-39.9, adult: Secondary | ICD-10-CM | POA: Diagnosis not present

## 2021-04-12 DIAGNOSIS — Z01419 Encounter for gynecological examination (general) (routine) without abnormal findings: Secondary | ICD-10-CM | POA: Diagnosis not present

## 2021-04-19 DIAGNOSIS — H40033 Anatomical narrow angle, bilateral: Secondary | ICD-10-CM | POA: Diagnosis not present

## 2021-04-19 DIAGNOSIS — H2513 Age-related nuclear cataract, bilateral: Secondary | ICD-10-CM | POA: Diagnosis not present

## 2021-04-19 DIAGNOSIS — H25013 Cortical age-related cataract, bilateral: Secondary | ICD-10-CM | POA: Diagnosis not present

## 2021-04-19 DIAGNOSIS — H40013 Open angle with borderline findings, low risk, bilateral: Secondary | ICD-10-CM | POA: Diagnosis not present

## 2021-05-08 DIAGNOSIS — Z20822 Contact with and (suspected) exposure to covid-19: Secondary | ICD-10-CM | POA: Diagnosis not present

## 2021-05-22 DIAGNOSIS — R748 Abnormal levels of other serum enzymes: Secondary | ICD-10-CM | POA: Diagnosis not present

## 2021-05-22 DIAGNOSIS — E7801 Familial hypercholesterolemia: Secondary | ICD-10-CM | POA: Diagnosis not present

## 2021-05-22 DIAGNOSIS — E049 Nontoxic goiter, unspecified: Secondary | ICD-10-CM | POA: Diagnosis not present

## 2021-05-22 DIAGNOSIS — E0590 Thyrotoxicosis, unspecified without thyrotoxic crisis or storm: Secondary | ICD-10-CM | POA: Diagnosis not present

## 2021-05-31 ENCOUNTER — Ambulatory Visit: Payer: Medicare Other | Admitting: Nurse Practitioner

## 2021-06-01 ENCOUNTER — Other Ambulatory Visit: Payer: Self-pay

## 2021-06-01 ENCOUNTER — Encounter: Payer: Self-pay | Admitting: Nurse Practitioner

## 2021-06-01 ENCOUNTER — Ambulatory Visit (INDEPENDENT_AMBULATORY_CARE_PROVIDER_SITE_OTHER): Payer: Medicare Other | Admitting: Nurse Practitioner

## 2021-06-01 VITALS — BP 130/78 | HR 58 | Temp 98.4°F | Ht 61.4 in | Wt 189.4 lb

## 2021-06-01 DIAGNOSIS — Z6835 Body mass index (BMI) 35.0-35.9, adult: Secondary | ICD-10-CM | POA: Diagnosis not present

## 2021-06-01 DIAGNOSIS — E6609 Other obesity due to excess calories: Secondary | ICD-10-CM

## 2021-06-01 DIAGNOSIS — T148XXA Other injury of unspecified body region, initial encounter: Secondary | ICD-10-CM | POA: Diagnosis not present

## 2021-06-01 NOTE — Progress Notes (Signed)
I,Yamilka Roman [employer] as a Education administrator for [employer], NP.,have documented all relevant documentation on the behalf of [employer], NP,as directed by  Bary Castilla, NP while in the presence of Bary Castilla, NP.  This visit occurred during the SARS-CoV-2 public health emergency.  Safety protocols were in place, including screening questions prior to the visit, additional usage of staff PPE, and extensive cleaning of exam room while observing appropriate contact time as indicated for disinfecting solutions.  Subjective:     Patient ID: [name] , female    DOB: Jul 12, 1933 , 87 y.o.   MRN: [id number]   Chief Complaint  Patient presents with   brusing    Patient stated she has random bruising on her legs and she would like to have some bloodwork done.    darkening of toenails    HPI  Patient presents today for some brusing she has on her legs. She stated Dr.Yates suggested that she get some bloodwork done. She has been having some spots for about 6 months ago. She had a left knee replacement and called her surgeon and he had told her to get her blood work done. She takes aspirin 81 mg.  Vitamin C, D and calcium and flaxseed.      Past Medical History:  Diagnosis Date   Aortic stenosis    Echo 07/05/16: Very mild AS, Mean gradient 10 mm Hg, Peak gradient (S) 19 mmHg    Arthritis    left hip and back   Bronchiectasis    isolated to RML; status post right middle lobe partial resection.   Complication of anesthesia    hard to wake up   Dyspnea    Gall stones    GERD (gastroesophageal reflux disease)    history    H/O osteoporosis    Hypertension    Hyperthyroidism    endocrinologist - Dr. Chalmers Cater   Pneumonia    Yeast infection      Family History  Problem Relation Age of Onset   Hypertension Mother    CVA Mother 35   Heart attack Sister    Diabetes Sister    Healthy Father    Heart attack Brother    Heart attack Brother      Current  Outpatient Medications:    busPIRone (BUSPAR) 5 MG tablet, Take 1 tablet (5 mg total) by mouth 2 (two) times daily. (Patient taking differently: Take 5 mg by mouth daily as needed (anxiety).), Disp: 60 tablet, Rfl: 1   calcium carbonate (OS-CAL) 600 MG TABS tablet, Take 600 mg by mouth daily., Disp: , Rfl:    cholecalciferol (VITAMIN D3) 25 MCG (1000 UT) tablet, Take 1,000 Units by mouth daily., Disp: , Rfl:    diclofenac Sodium (VOLTAREN) 1 % GEL, APPLY 2 GRAMS TOPICALLY FOUR TIMES A DAY, Disp: 100 g, Rfl: 27   famotidine (PEPCID) 20 MG tablet, Take 1 tablet (20 mg total) by mouth daily., Disp: 30 tablet, Rfl: 1   fenofibrate (TRICOR) 145 MG tablet, TAKE 1 TABLET DAILY, Disp: 90 tablet, Rfl: 3   Flaxseed, Linseed, (FLAXSEED OIL) 1000 MG CAPS, Take 1,000 mg by mouth daily., Disp: , Rfl:    furosemide (LASIX) 20 MG tablet, TAKE 1 TABLET DAILY, Disp: 90 tablet, Rfl: 3   methimazole (TAPAZOLE) 5 MG tablet, Take 2.5 mg by mouth daily. , Disp: , Rfl:    metoprolol succinate (TOPROL-XL) 25 MG 24 hr tablet, TAKE 1 TABLET DAILY, Disp: 90 tablet, Rfl: 3   nystatin  cream (MYCOSTATIN), APPLY CREAM TO AFFECTED AREA TWICE A DAY AS NEEDED (Patient taking differently: Apply 1 application topically 2 (two) times daily as needed for dry skin.), Disp: 30 g, Rfl: 23   potassium chloride SA (KLOR-CON) 20 MEQ tablet, TAKE 1 TABLET DAILY, Disp: 90 tablet, Rfl: 3   Allergies  Allergen Reactions   Ibuprofen Other (See Comments)    hallucinations   Naproxen Sodium Other (See Comments)    hallucinations     Review of Systems  Constitutional:  Negative for chills, fatigue, fever and unexpected weight change.  HENT:  Negative for sneezing.   Respiratory:  Negative for cough and shortness of breath.   Cardiovascular:  Negative for chest pain and palpitations.  Skin:  Positive for color change.  Neurological:  Negative for numbness and headaches.  Hematological:  Bruises/bleeds easily.    Today's Vitals   06/01/21  1110  BP: 130/78  Pulse: (!) 58  Temp: 98.4 F (36.9 C)  Weight: 189 lb 6.4 oz (85.9 kg)  Height: 5' 1.4" (1.56 m)  PainSc: 0-No pain   Body mass index is 35.32 kg/m.  Wt Readings from Last 3 Encounters:  06/01/21 189 lb 6.4 oz (85.9 kg)  02/07/21 184 lb (83.5 kg)  12/27/20 184 lb 6.4 oz (83.6 kg)     Objective:  Physical Exam Constitutional:      Appearance: Normal appearance. She is obese.  HENT:     Head: Normocephalic and atraumatic.  Cardiovascular:     Rate and Rhythm: Normal rate and regular rhythm.     Pulses: Normal pulses.     Heart sounds: Normal heart sounds. No murmur heard. Pulmonary:     Effort: Pulmonary effort is normal. No respiratory distress.     Breath sounds: Normal breath sounds.  Skin:    General: Skin is warm and dry.     Capillary Refill: Capillary refill takes less than 2 seconds.     Findings: Bruising present. No signs of injury or wound.     Comments: 2-3 small bruising on upper right leg noted   Neurological:     Mental Status: She is alert and oriented to person, place, and time.        Assessment And Plan:     1. Bruising -Pt. Reports of random bruising on her legs.  -Does not recall injuring her self. He does have a history of right knee replacement x 1 year ago.  -will check blood work.  - CBC with Diff - APTT - CMP14+EGFR - INR/PT  2. Class 2 obesity due to excess calories with body mass index (BMI) of 35.0 to 35.9 in adult, unspecified whether serious comorbidity present  Advised patient on a healthy diet including avoiding fast food and red meats. Increase the intake of lean meats including grilled chicken and [place].  Drink a lot of water. Decrease intake of fatty foods. Exercise for 30-45 min. 4-5 a week to decrease the risk of cardiac event.   Signed handicap note for her to get a handicap pass since she is unable walk far distance due to her weak legs. She uses a cane to walk or a walker to walk.   The patient was  encouraged to call or send a message through [place] for any questions or concerns.   Follow up: if symptoms persist or do not get better.   Patient was given opportunity to ask questions. Patient verbalized understanding of the plan and was able to repeat key elements  of the plan. All questions were answered to their satisfaction.  Raman Blasa Raisch, DNP   I, Raman Asier Desroches have reviewed all documentation for this visit. The documentation on 06/01/21 for the exam, diagnosis, procedures, and orders are all accurate and complete.    IF YOU HAVE BEEN REFERRED TO A SPECIALIST, IT MAY TAKE 1-2 WEEKS TO SCHEDULE/PROCESS THE REFERRAL. IF YOU HAVE NOT HEARD FROM US/SPECIALIST IN TWO WEEKS, PLEASE GIVE [place] A CALL AT [phone].   THE PATIENT IS ENCOURAGED TO PRACTICE SOCIAL DISTANCING DUE TO THE COVID-19 PANDEMIC.

## 2021-06-01 NOTE — Addendum Note (Signed)
Addended by: Luana Shu on: 06/01/2021 03:49 PM   Modules accepted: Orders

## 2021-06-02 LAB — CBC WITH DIFFERENTIAL/PLATELET
Basophils Absolute: 0.1 10*3/uL (ref 0.0–0.2)
Basos: 1 %
EOS (ABSOLUTE): 0.1 10*3/uL (ref 0.0–0.4)
Eos: 2 %
Hematocrit: 38.4 % (ref 34.0–46.6)
Hemoglobin: 12.5 g/dL (ref 11.1–15.9)
Immature Grans (Abs): 0 10*3/uL (ref 0.0–0.1)
Immature Granulocytes: 0 %
Lymphocytes Absolute: 2.2 10*3/uL (ref 0.7–3.1)
Lymphs: 46 %
MCH: 27.4 pg (ref 26.6–33.0)
MCHC: 32.6 g/dL (ref 31.5–35.7)
MCV: 84 fL (ref 79–97)
Monocytes Absolute: 0.5 10*3/uL (ref 0.1–0.9)
Monocytes: 11 %
Neutrophils Absolute: 1.9 10*3/uL (ref 1.4–7.0)
Neutrophils: 40 %
Platelets: 397 10*3/uL (ref 150–450)
RBC: 4.57 x10E6/uL (ref 3.77–5.28)
RDW: 14.9 % (ref 11.7–15.4)
WBC: 4.9 10*3/uL (ref 3.4–10.8)

## 2021-06-02 LAB — CMP14+EGFR
ALT: 6 IU/L (ref 0–32)
AST: 17 IU/L (ref 0–40)
Albumin/Globulin Ratio: 1.1 — ABNORMAL LOW (ref 1.2–2.2)
Albumin: 4 g/dL (ref 3.6–4.6)
Alkaline Phosphatase: 61 IU/L (ref 44–121)
BUN/Creatinine Ratio: 23 (ref 12–28)
BUN: 17 mg/dL (ref 8–27)
Bilirubin Total: 0.4 mg/dL (ref 0.0–1.2)
CO2: 23 mmol/L (ref 20–29)
Calcium: 9.5 mg/dL (ref 8.7–10.3)
Chloride: 104 mmol/L (ref 96–106)
Creatinine, Ser: 0.74 mg/dL (ref 0.57–1.00)
Globulin, Total: 3.7 g/dL (ref 1.5–4.5)
Glucose: 84 mg/dL (ref 65–99)
Potassium: 4.9 mmol/L (ref 3.5–5.2)
Sodium: 142 mmol/L (ref 134–144)
Total Protein: 7.7 g/dL (ref 6.0–8.5)
eGFR: 79 mL/min/{1.73_m2} (ref >59)

## 2021-06-02 LAB — PROTIME-INR
INR: 1 (ref 0.9–1.2)
Prothrombin Time: 10.6 s (ref 9.1–12.0)

## 2021-06-08 DIAGNOSIS — Z20822 Contact with and (suspected) exposure to covid-19: Secondary | ICD-10-CM | POA: Diagnosis not present

## 2021-06-16 ENCOUNTER — Other Ambulatory Visit: Payer: Self-pay | Admitting: Internal Medicine

## 2021-06-29 ENCOUNTER — Encounter: Payer: Self-pay | Admitting: Internal Medicine

## 2021-06-29 ENCOUNTER — Ambulatory Visit (INDEPENDENT_AMBULATORY_CARE_PROVIDER_SITE_OTHER): Payer: Medicare Other | Admitting: Internal Medicine

## 2021-06-29 ENCOUNTER — Ambulatory Visit: Payer: Medicare Other

## 2021-06-29 ENCOUNTER — Other Ambulatory Visit: Payer: Self-pay

## 2021-06-29 VITALS — BP 122/60 | HR 70 | Temp 97.9°F | Ht 61.2 in | Wt 192.0 lb

## 2021-06-29 DIAGNOSIS — Z2821 Immunization not carried out because of patient refusal: Secondary | ICD-10-CM | POA: Diagnosis not present

## 2021-06-29 DIAGNOSIS — I129 Hypertensive chronic kidney disease with stage 1 through stage 4 chronic kidney disease, or unspecified chronic kidney disease: Secondary | ICD-10-CM | POA: Diagnosis not present

## 2021-06-29 DIAGNOSIS — R935 Abnormal findings on diagnostic imaging of other abdominal regions, including retroperitoneum: Secondary | ICD-10-CM | POA: Diagnosis not present

## 2021-06-29 DIAGNOSIS — N182 Chronic kidney disease, stage 2 (mild): Secondary | ICD-10-CM | POA: Diagnosis not present

## 2021-06-29 DIAGNOSIS — R110 Nausea: Secondary | ICD-10-CM

## 2021-06-29 DIAGNOSIS — Z6836 Body mass index (BMI) 36.0-36.9, adult: Secondary | ICD-10-CM | POA: Diagnosis not present

## 2021-06-29 DIAGNOSIS — E2839 Other primary ovarian failure: Secondary | ICD-10-CM | POA: Diagnosis not present

## 2021-06-29 DIAGNOSIS — Z23 Encounter for immunization: Secondary | ICD-10-CM | POA: Diagnosis not present

## 2021-06-29 DIAGNOSIS — I700 Atherosclerosis of aorta: Secondary | ICD-10-CM

## 2021-06-29 MED ORDER — ZOSTER VAC RECOMB ADJUVANTED 50 MCG/0.5ML IM SUSR: 0.5000 mL | Freq: Once | INTRAMUSCULAR | 0 refills | Status: AC

## 2021-06-29 NOTE — Progress Notes (Signed)
Rich Brave [place],acting as a Education administrator for Maximino Greenland, MD.,have documented all relevant documentation on the behalf of Maximino Greenland, MD,as directed by  Maximino Greenland, MD while in the presence of Maximino Greenland, MD.  This visit occurred during the SARS-CoV-2 public health emergency.  Safety protocols were in place, including screening questions prior to the visit, additional usage of staff PPE, and extensive cleaning of exam room while observing appropriate contact time as indicated for disinfecting solutions.  Subjective:     Patient ID: [name] , female    DOB: [birth year] , 87 y.o.   MRN: [id number]   Chief Complaint  Patient presents with   Hypertension    HPI  The patient is here today for HTN f/u. She reports compliance with meds. She denies headaches, chest pain and shortness of breath.   Hypertension This is a chronic problem. The current episode started more than 1 year ago. The problem has been gradually improving since onset. The problem is controlled. Pertinent negatives include no blurred vision, chest pain, palpitations or shortness of breath. Risk factors for coronary artery disease include dyslipidemia, obesity and post-menopausal state. Past treatments include beta blockers. The current treatment provides moderate improvement. Compliance problems include exercise.     Past Medical History:  Diagnosis Date   Aortic stenosis    Echo 07/05/16: Very mild AS, Mean gradient 10 mm Hg, Peak gradient (S) 19 mmHg    Arthritis    left hip and back   Bronchiectasis    isolated to RML; status post right middle lobe partial resection.   Complication of anesthesia    hard to wake up   Dyspnea    Gall stones    GERD (gastroesophageal reflux disease)    history    H/O osteoporosis    Hypertension    Hyperthyroidism    endocrinologist - Dr. Chalmers Cater   Pneumonia    Yeast infection      Family History  Problem Relation Age of Onset   Hypertension Mother    CVA  Mother 82   Heart attack Sister    Diabetes Sister    Healthy Father    Heart attack Brother    Heart attack Brother      Current Outpatient Medications:    busPIRone (BUSPAR) 5 MG tablet, Take 1 tablet (5 mg total) by mouth 2 (two) times daily. (Patient taking differently: Take 5 mg by mouth daily as needed (anxiety).), Disp: 60 tablet, Rfl: 1   calcium carbonate (OS-CAL) 600 MG TABS tablet, Take 600 mg by mouth daily., Disp: , Rfl:    cholecalciferol (VITAMIN D3) 25 MCG (1000 UT) tablet, Take 1,000 Units by mouth daily., Disp: , Rfl:    diclofenac Sodium (VOLTAREN) 1 % GEL, APPLY 2 GRAMS TOPICALLY FOUR TIMES A DAY, Disp: 100 g, Rfl: 27   famotidine (PEPCID) 20 MG tablet, Take 1 tablet (20 mg total) by mouth daily., Disp: 30 tablet, Rfl: 1   fenofibrate (TRICOR) 145 MG tablet, TAKE 1 TABLET DAILY, Disp: 90 tablet, Rfl: 3   Flaxseed, Linseed, (FLAXSEED OIL) 1000 MG CAPS, Take 1,000 mg by mouth daily., Disp: , Rfl:    furosemide (LASIX) 20 MG tablet, TAKE 1 TABLET DAILY, Disp: 90 tablet, Rfl: 3   methimazole (TAPAZOLE) 5 MG tablet, Take 2.5 mg by mouth daily. , Disp: , Rfl:    metoprolol succinate (TOPROL-XL) 25 MG 24 hr tablet, TAKE 1 TABLET DAILY, Disp: 90 tablet, Rfl: 3  nystatin cream (MYCOSTATIN), APPLY CREAM TO AFFECTED AREA TWICE A DAY AS NEEDED (Patient taking differently: Apply 1 application topically 2 (two) times daily as needed for dry skin.), Disp: 30 g, Rfl: 23   potassium chloride SA (KLOR-CON) 20 MEQ tablet, TAKE 1 TABLET DAILY, Disp: 90 tablet, Rfl: 3   Allergies  Allergen Reactions   Ibuprofen Other (See Comments)    hallucinations   Naproxen Sodium Other (See Comments)    hallucinations     Review of Systems  Constitutional: Negative.   Eyes:  Negative for blurred vision.  Respiratory: Negative.  Negative for shortness of breath.   Cardiovascular: Negative.  Negative for chest pain and palpitations.  Gastrointestinal:  Positive for nausea.       She c/o  intermittent nausea with occasional vomiting. States it happens no more than once monthly. She has not identified a trigger for her sx.  However, she adds that the most common trigger is when she eats too much. Denies diarrhea. She does not think smells trigger her sx.   Neurological: Negative.   Psychiatric/Behavioral: Negative.      Today's Vitals   06/29/21 1046  BP: 122/60  Pulse: 70  Temp: 97.9 F (36.6 C)  Weight: 192 lb (87.1 kg)  Height: 5' 1.2" (1.554 m)  PainSc: 0-No pain   Body mass index is 36.04 kg/m.  Wt Readings from Last 3 Encounters:  06/29/21 192 lb (87.1 kg)  06/01/21 189 lb 6.4 oz (85.9 kg)  02/07/21 184 lb (83.5 kg)    Objective:  Physical Exam Vitals and nursing note reviewed.  Constitutional:      Appearance: Normal appearance.  HENT:     Head: Normocephalic and atraumatic.     Nose:     Comments: Masked     Mouth/Throat:     Comments: Masked  Eyes:     Extraocular Movements: Extraocular movements intact.  Cardiovascular:     Rate and Rhythm: Normal rate and regular rhythm.     Heart sounds: Normal heart sounds.  Pulmonary:     Effort: Pulmonary effort is normal.     Breath sounds: Normal breath sounds.  Abdominal:     General: Bowel sounds are normal. There is no distension.     Palpations: Abdomen is soft.     Tenderness: There is no abdominal tenderness.  Musculoskeletal:     Cervical back: Normal range of motion.  Skin:    General: Skin is warm.  Neurological:     General: No focal deficit present.     Mental Status: She is alert.  Psychiatric:        Mood and Affect: Mood normal.        Behavior: Behavior normal.        Assessment And Plan:     1. Hypertensive nephropathy Comments: Chronic, well controlled. Encouraged to follow low sodium diet. Most recent renal function reviewed today, no need to recheck.   2. Chronic renal disease, stage II Comments: Chronic, Pls see above. Encouraged to stay well hydrated and keep BP well  controlled to decrease risk of progression of CKD.  3. Nausea Comments: I will check amylase/lipase. Will check further studies as needed. Possibly exacerbated by reflux. Advised to stop eating 3hrs prior to going to bed. - Amylase - Lipase  4. Estrogen deficiency Comments: She agrees to have bone density scheduled at [place], where she gets her mammogram. Advised to increase weight bearing exercises and take vit D/Ca as indicated.  5. Atherosclerosis of aorta ([place]) Comments: Seen on 01/2018 CT abd. She is encouraged to follow heart healthy lifestyle. Would benefit from statin therapy, yet has been intolerant. She will c/w fenofibrate  6. Abnormal MRI of abdomen Comments: I will refer her for MR abdomen w/ pancreas protocol. - MR Abdomen W Wo Contrast; Future  7. Class 2 severe obesity due to excess calories with serious comorbidity and body mass index (BMI) of 36.0 to 36.9 in adult [facility]) Comments: She is encouraged to strive for BMI less than 30 to decrease cardiac risk. Advised to aim for at least 150 minutes of exercise/week.   8. Immunization due Comments: I will send rx Shingrix to her local pharmacy.  - Zoster Vaccine Adjuvanted [facility]) injection; Inject 0.5 mLs into the muscle once for 1 dose.  Dispense: 0.5 mL; Refill: 0  9. Influenza vaccination declined   Patient was given opportunity to ask questions. Patient verbalized understanding of the plan and was able to repeat key elements of the plan. All questions were answered to their satisfaction.   I, Maximino Greenland, MD, have reviewed all documentation for this visit. The documentation on 06/29/21 for the exam, diagnosis, procedures, and orders are all accurate and complete.   IF YOU HAVE BEEN REFERRED TO A SPECIALIST, IT MAY TAKE 1-2 WEEKS TO SCHEDULE/PROCESS THE REFERRAL. IF YOU HAVE NOT HEARD FROM US/SPECIALIST IN TWO WEEKS, PLEASE GIVE [place] A CALL AT [phone].   THE PATIENT IS ENCOURAGED TO PRACTICE SOCIAL  DISTANCING DUE TO THE COVID-19 PANDEMIC.

## 2021-06-29 NOTE — Patient Instructions (Signed)

## 2021-06-30 ENCOUNTER — Other Ambulatory Visit: Payer: Self-pay

## 2021-06-30 DIAGNOSIS — R112 Nausea with vomiting, unspecified: Secondary | ICD-10-CM

## 2021-06-30 LAB — AMYLASE: Amylase: 81 U/L (ref 31–110)

## 2021-06-30 LAB — LIPASE: Lipase: 29 U/L (ref 14–85)

## 2021-07-10 DIAGNOSIS — K449 Diaphragmatic hernia without obstruction or gangrene: Secondary | ICD-10-CM | POA: Diagnosis not present

## 2021-07-10 DIAGNOSIS — R112 Nausea with vomiting, unspecified: Secondary | ICD-10-CM | POA: Diagnosis not present

## 2021-07-10 DIAGNOSIS — K219 Gastro-esophageal reflux disease without esophagitis: Secondary | ICD-10-CM | POA: Diagnosis not present

## 2021-07-19 ENCOUNTER — Ambulatory Visit: Payer: Medicare Other

## 2021-07-19 ENCOUNTER — Ambulatory Visit (INDEPENDENT_AMBULATORY_CARE_PROVIDER_SITE_OTHER): Payer: Medicare Other

## 2021-07-19 ENCOUNTER — Telehealth: Payer: Self-pay

## 2021-07-19 ENCOUNTER — Other Ambulatory Visit: Payer: Self-pay

## 2021-07-19 VITALS — BP 102/58 | HR 62 | Temp 98.0°F | Ht 61.0 in | Wt 193.6 lb

## 2021-07-19 DIAGNOSIS — Z0000 Encounter for general adult medical examination without abnormal findings: Secondary | ICD-10-CM

## 2021-07-19 NOTE — Progress Notes (Signed)
This visit occurred during the SARS-CoV-2 public health emergency.  Safety protocols were in place, including screening questions prior to the visit, additional usage of staff PPE, and extensive cleaning of exam room while observing appropriate contact time as indicated for disinfecting solutions.  Subjective:   [name] is a 87 y.o. female who presents for Medicare Annual (Subsequent) preventive examination.  Review of Systems     Cardiac Risk Factors include: advanced age (>63men, >66 women);hypertension;obesity (BMI >30kg/m2);sedentary lifestyle     Objective:    Today's Vitals   07/19/21 1106  BP: (!) 102/58  Pulse: 62  Temp: 98 F (36.7 C)  TempSrc: Oral  SpO2: 97%  Weight: 193 lb 9.6 oz (87.8 kg)  Height: 5\' 1"  (1.549 m)   Body mass index is 36.58 kg/m.  Advanced Directives 07/19/2021 07/13/2020 02/24/2020 02/18/2020 07/08/2019 12/19/2018 12/08/2018  Does Patient Have a Medical Advance Directive? Yes Yes No No No Yes No  Type of Paramedic of [place];Living will [place];Living will - - - [place];Living will -  Does patient want to make changes to medical advance directive? - - - - - No - Patient declined -  Copy of [place] in Chart? No - copy requested No - copy requested - - - No - copy requested -  Would patient like information on creating a medical advance directive? - - No - Patient declined No - Patient declined - No - Patient declined No - Patient declined    Current Medications (verified) Outpatient Encounter Medications as of 07/19/2021  Medication Sig   busPIRone (BUSPAR) 5 MG tablet Take 1 tablet (5 mg total) by mouth 2 (two) times daily. (Patient taking differently: Take 5 mg by mouth daily as needed (anxiety).)   calcium carbonate (OS-CAL) 600 MG TABS tablet Take 600 mg by mouth daily.   cholecalciferol (VITAMIN D3) 25 MCG (1000 UT) tablet Take 1,000 Units by mouth daily.    diclofenac Sodium (VOLTAREN) 1 % GEL APPLY 2 GRAMS TOPICALLY FOUR TIMES A DAY   famotidine (PEPCID) 20 MG tablet Take 1 tablet (20 mg total) by mouth daily.   fenofibrate (TRICOR) 145 MG tablet TAKE 1 TABLET DAILY   Flaxseed, Linseed, (FLAXSEED OIL) 1000 MG CAPS Take 1,000 mg by mouth daily.   furosemide (LASIX) 20 MG tablet TAKE 1 TABLET DAILY   methimazole (TAPAZOLE) 5 MG tablet Take 2.5 mg by mouth daily.    metoprolol succinate (TOPROL-XL) 25 MG 24 hr tablet TAKE 1 TABLET DAILY   nystatin cream (MYCOSTATIN) APPLY CREAM TO AFFECTED AREA TWICE A DAY AS NEEDED (Patient taking differently: Apply 1 application topically 2 (two) times daily as needed for dry skin.)   potassium chloride SA (KLOR-CON) 20 MEQ tablet TAKE 1 TABLET DAILY   No facility-administered encounter medications on file as of 07/19/2021.    Allergies (verified) Ibuprofen and Naproxen sodium   History: Past Medical History:  Diagnosis Date   Aortic stenosis    Echo 07/05/16: Very mild AS, Mean gradient 10 mm Hg, Peak gradient (S) 19 mmHg    Arthritis    left hip and back   Bronchiectasis    isolated to RML; status post right middle lobe partial resection.   Complication of anesthesia    hard to wake up   Dyspnea    Gall stones    GERD (gastroesophageal reflux disease)    history    H/O osteoporosis    Hypertension  Hyperthyroidism    endocrinologist - Dr. Chalmers Cater   Pneumonia    Yeast infection    Past Surgical History:  Procedure Laterality Date   ABDOMINAL HYSTERECTOMY  1974   CHOLECYSTECTOMY N/A 07/02/2016   Procedure: LAPAROSCOPIC CHOLECYSTECTOMY;  Surgeon: Stark Klein, MD;  Location: [facility];  Service: General;  Laterality: N/A;   COLONOSCOPY     ENDOSCOPIC RETROGRADE CHOLANGIOPANCREATOGRAPHY (ERCP) WITH PROPOFOL N/A 08/07/2018   Procedure: ENDOSCOPIC RETROGRADE CHOLANGIOPANCREATOGRAPHY (ERCP) WITH PROPOFOL;  Surgeon: Carol Ada, MD;  Location: [facility];  Service: Endoscopy;  Laterality: N/A;    EYE SURGERY     stye removed   HARDWARE REMOVAL Left 02/24/2020   Procedure: Hardware Removal, left knee;  Surgeon: [name], MD;  Location: [facility];  Service: Orthopedics;  Laterality: Left;   HERNIA REPAIR  1975   KNEE SURGERY Left    LUNG REMOVAL, PARTIAL  208   RML   NM MYOVIEW LTD  06/2011   Normal EF. No ischemia or infarction.   SPHINCTEROTOMY  08/07/2018   Procedure: SPHINCTEROTOMY;  Surgeon: Carol Ada, MD;  Location: [facility];  Service: Endoscopy;;  balloon sweep   STERIOD INJECTION Right 02/24/2020   Procedure: RIGHT KNEE STEROID INTRA-ARTICULAR MARCAINE/DEPO-MEDROL INJECTION UNDER ANESTHESIA;  Surgeon: [name], MD;  Location: [facility];  Service: Orthopedics;  Laterality: Right;   TOTAL HIP ARTHROPLASTY Left 04/05/2017   Procedure: LEFT TOTAL HIP ARTHROPLASTY ANTERIOR APPROACH;  Surgeon: [name], MD;  Location: [facility];  Service: Orthopedics;  Laterality: Left;   TOTAL HIP ARTHROPLASTY Right 12/19/2018   TOTAL HIP ARTHROPLASTY Right 12/19/2018   Procedure: RIGHT TOTAL HIP ARTHROPLASTY-DIRECT ANTERIOR APPROACH;  Surgeon: [name], MD;  Location: [facility];  Service: Orthopedics;  Laterality: Right;   TOTAL KNEE ARTHROPLASTY Left 02/24/2020   Procedure: LEFT TOTAL KNEE ARTHROPLASTY;  Surgeon: [name], MD;  Location: [facility];  Service: Orthopedics;  Laterality: Left;   TRANSTHORACIC ECHOCARDIOGRAM  06/2011   Mild concentric LVH. Normal EF with impaired relaxation. Mildly elevated RV pressures of 30 and 40 mmHg.   TRANSTHORACIC ECHOCARDIOGRAM  06/2016   normal LV size and function. EF 60-65% with GRD 2 DD. Mild aortic stenosis. Mild LA dilation. Mild to moderately increased PA pressures (46 mmHg).   TUBAL LIGATION     Family History  Problem Relation Age of Onset   Hypertension Mother    CVA Mother 21   Heart attack Sister    Diabetes Sister    Healthy Father    Heart attack Brother    Heart attack Brother    Social History   Socioeconomic History    Marital status: Widowed    Spouse name: Not on file   Number of children: Not on file   Years of education: Not on file   Highest education level: Not on file  Occupational History   Occupation: retired  Tobacco Use   Smoking status: Never   Smokeless tobacco: Never  Vaping Use   Vaping Use: Never used  Substance and Sexual Activity   Alcohol use: No   Drug use: No   Sexual activity: Not Currently  Other Topics Concern   Not on file  Social History Narrative   She is a widowed mother of 76, grandmother for, a great-grandmother 39.   She has been trying to do more exercise than before, but has been injured recently by her back pain.   She never smoked, doesn't drink alcohol.   Social Determinants of  Health   Financial Resource Strain: Low Risk    Difficulty of Paying Living Expenses: Not hard at all  Food Insecurity: No Food Insecurity   Worried About [place] in the Last Year: Never true   Ran Out of Food in the Last Year: Never true  Transportation Needs: No Transportation Needs   Lack of Transportation (Medical): No   Lack of Transportation (Non-Medical): No  Physical Activity: Inactive   Days of Exercise per Week: 0 days   Minutes of Exercise per Session: 0 min  Stress: No Stress Concern Present   Feeling of Stress : Not at all  Social Connections: Not on file    Tobacco Counseling Counseling given: Not Answered   Clinical Intake:  Pre-visit preparation completed: Yes  Pain : No/denies pain     Nutritional Status: BMI > 30  Obese Nutritional Risks: Nausea/ vomitting/ diarrhea (nausea and vomitting 2-3 weeks ago) Diabetes: No  How often do you need to have someone help you when you read instructions, pamphlets, or other written materials from your doctor or pharmacy?: 1 - Never What is the last grade level you completed in school?: some college  Diabetic? no  Interpreter Needed?: No  Information entered by :: NAllen LPN   Activities of  Daily Living In your present state of health, do you have any difficulty performing the following activities: 07/19/2021 06/01/2021  Hearing? N N  Vision? N N  Difficulty concentrating or making decisions? N N  Walking or climbing stairs? N Y  Dressing or bathing? N N  Doing errands, shopping? N N  Preparing Food and eating ? N -  Using the Toilet? N -  In the past six months, have you accidently leaked urine? Y -  Do you have problems with loss of bowel control? N -  Managing your Medications? N -  Managing your Finances? N -  Housekeeping or managing your Housekeeping? N -  Some recent data might be hidden    Patient Care Team: Glendale Chard, MD as PCP - General (Internal Medicine) Leonie Man, MD as PCP - Cardiology (Cardiology) [name], MD as Consulting Physician (Orthopedic Surgery)  Indicate any recent Medical Services you may have received from other than [facility] providers in the past year (date may be approximate).     Assessment:   This is a routine wellness examination for Taviana.  Hearing/Vision screen Vision Screening - Comments:: Regular eye exams, Dr. Kathlen Mody  Dietary issues and exercise activities discussed: Current Exercise Habits: The patient does not participate in regular exercise at present   Goals Addressed             This Visit's Progress    Patient Stated       07/19/2021, wants to weigh 170 pounds       Depression Screen PHQ 2/9 Scores 07/19/2021 07/13/2020 01/05/2020 08/12/2019 07/08/2019 07/08/2019 04/01/2019  PHQ - 2 Score 0 0 1 0 0 0 0  PHQ- 9 Score - - 7 - - - -    Fall Risk Fall Risk  07/19/2021 07/13/2020 01/05/2020 08/12/2019 07/08/2019  Falls in the past year? 0 0 - 0 0  Comment - - - - -  Number falls in past yr: - - 0 - -  Injury with Fall? - - 0 - -  Risk for fall due to : Impaired balance/gait;Impaired mobility;Medication side effect Impaired balance/gait;Medication side effect;Orthopedic patient - - Impaired  balance/gait;Impaired mobility  Follow up Falls evaluation  completed;Education provided;Falls prevention discussed Falls evaluation completed;Education provided;Falls prevention discussed - - Falls evaluation completed;Education provided;Falls prevention discussed    FALL RISK PREVENTION PERTAINING TO THE HOME:  Any stairs in or around the home? Yes  If so, are there any without handrails? No  Home free of loose throw rugs in walkways, pet beds, electrical cords, etc? Yes  Adequate lighting in your home to reduce risk of falls? Yes   ASSISTIVE DEVICES UTILIZED TO PREVENT FALLS:  Life alert? Yes  Use of a cane, walker or w/c? Yes  Grab bars in the bathroom? Yes  Shower chair or bench in shower? Yes  Elevated toilet seat or a handicapped toilet? Yes   TIMED UP AND GO:  Was the test performed? No .     Gait slow and steady with assistive device  Cognitive Function:     6CIT Screen 07/19/2021 07/13/2020 07/08/2019  What Year? 0 points 0 points 0 points  What month? 0 points 0 points 0 points  What time? 0 points 0 points 0 points  Count back from 20 0 points 2 points 0 points  Months in reverse 2 points 2 points 0 points  Repeat phrase 8 points 0 points 0 points  Total Score 10 4 0    Immunizations Immunization History  Administered Date(s) Administered   PFIZER(Purple Top)SARS-COV-2 Vaccination 12/05/2019, 12/30/2019, 09/08/2020    TDAP status: Due, Education has been provided regarding the importance of this vaccine. Advised may receive this vaccine at local pharmacy or Health Dept. Aware to provide a copy of the vaccination record if obtained from local pharmacy or Health Dept. Verbalized acceptance and understanding.  Flu Vaccine status: Declined, Education has been provided regarding the importance of this vaccine but patient still declined. Advised may receive this vaccine at local pharmacy or Health Dept. Aware to provide a copy of the vaccination record if obtained  from local pharmacy or Health Dept. Verbalized acceptance and understanding.  Pneumococcal vaccine status: Declined,  Education has been provided regarding the importance of this vaccine but patient still declined. Advised may receive this vaccine at local pharmacy or Health Dept. Aware to provide a copy of the vaccination record if obtained from local pharmacy or Health Dept. Verbalized acceptance and understanding.   Covid-19 vaccine status: Completed vaccines  Qualifies for Shingles Vaccine? Yes   Zostavax completed No   Shingrix Completed?: needs second dose  Screening Tests Health Maintenance  Topic Date Due   Zoster Vaccines- Shingrix (1 of 2) Never done   MAMMOGRAM  08/25/2020   COVID-19 Vaccine (4 - Booster for Pfizer series) 12/01/2020   TETANUS/TDAP  12/27/2021 (Originally 05/24/1952)   INFLUENZA VACCINE  01/05/2022 (Originally 05/08/2021)   DEXA SCAN  Completed   HPV VACCINES  Aged Out    Health Maintenance  Health Maintenance Due  Topic Date Due   Zoster Vaccines- Shingrix (1 of 2) Never done   MAMMOGRAM  08/25/2020   COVID-19 Vaccine (4 - Booster for Pfizer series) 12/01/2020    Colorectal cancer screening: No longer required.   Mammogram status: scheduled for tomorrow  Bone Density status: Completed 08/26/2019.   Lung Cancer Screening: (Low Dose CT Chest recommended if Age 56-80 years, 30 pack-year currently smoking OR have quit w/in 15years.) does not qualify.   Lung Cancer Screening Referral: no  Additional Screening:  Hepatitis C Screening: does not qualify;  Vision Screening: Recommended annual ophthalmology exams for early detection of glaucoma and other disorders of the eye. Is the patient  up to date with their annual eye exam?  Yes  Who is the provider or what is the name of the office in which the patient attends annual eye exams? Dr. Kathlen Mody If pt is not established with a provider, would they like to be referred to a provider to establish care? No .    Dental Screening: Recommended annual dental exams for proper oral hygiene  Community Resource Referral / Chronic Care Management: CRR required this visit?  No   CCM required this visit?  No      Plan:     I have personally reviewed and noted the following in the patient's chart:   Medical and social history Use of alcohol, tobacco or illicit drugs  Current medications and supplements including opioid prescriptions.  Functional ability and status Nutritional status Physical activity Advanced directives List of other physicians Hospitalizations, surgeries, and ER visits in previous 12 months Vitals Screenings to include cognitive, depression, and falls Referrals and appointments  In addition, I have reviewed and discussed with patient certain preventive protocols, quality metrics, and best practice recommendations. A written personalized care plan for preventive services as well as general preventive health recommendations were provided to patient.     Kellie Simmering, LPN   15/17/[id number]   Nurse Notes:

## 2021-07-19 NOTE — Chronic Care Management (AMB) (Signed)
The [employer] pharmacogenomics process and benefits of testing was discussed with the patient. After the discussion, the patient made an informed consent to testing and the sample was collected and mailed to the external lab.  Pattricia Boss, [facility] Pharmacist Assistant [phone]

## 2021-07-19 NOTE — Patient Instructions (Signed)
[name] , Thank you for taking time to come for your Medicare Wellness Visit. I appreciate your ongoing commitment to your health goals. Please review the following plan we discussed and let me know if I can assist you in the future.   Screening recommendations/referrals: Colonoscopy: not required Mammogram: scheduled for tomorrow Bone Density: completed 08/26/2019 Recommended yearly ophthalmology/optometry visit for glaucoma screening and checkup Recommended yearly dental visit for hygiene and checkup  Vaccinations: Influenza vaccine: decline Pneumococcal vaccine: decline Tdap vaccine: decline Shingles vaccine: needs second dose   Covid-19: 09/08/2020, 12/30/2019, 12/05/2019  Advanced directives: Please bring a copy of your POA (Power of Attorney) and/or Living Will to your next appointment.   Conditions/risks identified: none  Next appointment: Follow up in one year for your annual wellness visit    Preventive Care 65 Years and Older, Female Preventive care refers to lifestyle choices and visits with your health care provider that can promote health and wellness. What does preventive care include? A yearly physical exam. This is also called an annual well check. Dental exams once or twice a year. Routine eye exams. Ask your health care provider how often you should have your eyes checked. Personal lifestyle choices, including: Daily care of your teeth and gums. Regular physical activity. Eating a healthy diet. Avoiding tobacco and drug use. Limiting alcohol use. Practicing safe sex. Taking low-dose aspirin every day. Taking vitamin and mineral supplements as recommended by your health care provider. What happens during an annual well check? The services and screenings done by your health care provider during your annual well check will depend on your age, overall health, lifestyle risk factors, and family history of disease. Counseling  Your health care provider may ask you  questions about your: Alcohol use. Tobacco use. Drug use. Emotional well-being. Home and relationship well-being. Sexual activity. Eating habits. History of falls. Memory and ability to understand (cognition). Work and work Statistician. Reproductive health. Screening  You may have the following tests or measurements: Height, weight, and BMI. Blood pressure. Lipid and cholesterol levels. These may be checked every 5 years, or more frequently if you are over 92 years old. Skin check. Lung cancer screening. You may have this screening every year starting at age 79 if you have a 30-pack-year history of smoking and currently smoke or have quit within the past 15 years. Fecal occult blood test (FOBT) of the stool. You may have this test every year starting at age 1. Flexible sigmoidoscopy or colonoscopy. You may have a sigmoidoscopy every 5 years or a colonoscopy every 10 years starting at age 25. Hepatitis C blood test. Hepatitis B blood test. Sexually transmitted disease (STD) testing. Diabetes screening. This is done by checking your blood sugar (glucose) after you have not eaten for a while (fasting). You may have this done every 1-3 years. Bone density scan. This is done to screen for osteoporosis. You may have this done starting at age 74. Mammogram. This may be done every 1-2 years. Talk to your health care provider about how often you should have regular mammograms. Talk with your health care provider about your test results, treatment options, and if necessary, the need for more tests. Vaccines  Your health care provider may recommend certain vaccines, such as: Influenza vaccine. This is recommended every year. Tetanus, diphtheria, and acellular pertussis (Tdap, Td) vaccine. You may need a Td booster every 10 years. Zoster vaccine. You may need this after age 42. Pneumococcal 13-valent conjugate (PCV13) vaccine. One dose is recommended  after age 52. Pneumococcal polysaccharide  (PPSV23) vaccine. One dose is recommended after age 35. Talk to your health care provider about which screenings and vaccines you need and how often you need them. This information is not intended to replace advice given to you by your health care provider. Make sure you discuss any questions you have with your health care provider. Document Released: 10/21/2015 Document Revised: 06/13/2016 Document Reviewed: 07/26/2015 Elsevier Interactive Patient Education  2017 [employer] Prevention in the Home Falls can cause injuries. They can happen to people of all ages. There are many things you can do to make your home safe and to help prevent falls. What can I do on the outside of my home? Regularly fix the edges of walkways and driveways and fix any cracks. Remove anything that might make you trip as you walk through a door, such as a raised step or threshold. Trim any bushes or trees on the path to your home. Use bright outdoor lighting. Clear any walking paths of anything that might make someone trip, such as rocks or tools. Regularly check to see if handrails are loose or broken. Make sure that both sides of any steps have handrails. Any raised decks and porches should have guardrails on the edges. Have any leaves, snow, or ice cleared regularly. Use sand or salt on walking paths during winter. Clean up any spills in your garage right away. This includes oil or grease spills. What can I do in the bathroom? Use night lights. Install grab bars by the toilet and in the tub and shower. Do not use towel bars as grab bars. Use non-skid mats or decals in the tub or shower. If you need to sit down in the shower, use a plastic, non-slip stool. Keep the floor dry. Clean up any water that spills on the floor as soon as it happens. Remove soap buildup in the tub or shower regularly. Attach bath mats securely with double-sided non-slip rug tape. Do not have throw rugs and other things on the  floor that can make you trip. What can I do in the bedroom? Use night lights. Make sure that you have a light by your bed that is easy to reach. Do not use any sheets or blankets that are too big for your bed. They should not hang down onto the floor. Have a firm chair that has side arms. You can use this for support while you get dressed. Do not have throw rugs and other things on the floor that can make you trip. What can I do in the kitchen? Clean up any spills right away. Avoid walking on wet floors. Keep items that you use a lot in easy-to-reach places. If you need to reach something above you, use a strong step stool that has a grab bar. Keep electrical cords out of the way. Do not use floor polish or wax that makes floors slippery. If you must use wax, use non-skid floor wax. Do not have throw rugs and other things on the floor that can make you trip. What can I do with my stairs? Do not leave any items on the stairs. Make sure that there are handrails on both sides of the stairs and use them. Fix handrails that are broken or loose. Make sure that handrails are as long as the stairways. Check any carpeting to make sure that it is firmly attached to the stairs. Fix any carpet that is loose or worn. Avoid having throw  rugs at the top or bottom of the stairs. If you do have throw rugs, attach them to the floor with carpet tape. Make sure that you have a light switch at the top of the stairs and the bottom of the stairs. If you do not have them, ask someone to add them for you. What else can I do to help prevent falls? Wear shoes that: Do not have high heels. Have rubber bottoms. Are comfortable and fit you well. Are closed at the toe. Do not wear sandals. If you use a stepladder: Make sure that it is fully opened. Do not climb a closed stepladder. Make sure that both sides of the stepladder are locked into place. Ask someone to hold it for you, if possible. Clearly mark and make  sure that you can see: Any grab bars or handrails. First and last steps. Where the edge of each step is. Use tools that help you move around (mobility aids) if they are needed. These include: Canes. Walkers. Scooters. Crutches. Turn on the lights when you go into a dark area. Replace any light bulbs as soon as they burn out. Set up your furniture so you have a clear path. Avoid moving your furniture around. If any of your floors are uneven, fix them. If there are any pets around you, be aware of where they are. Review your medicines with your doctor. Some medicines can make you feel dizzy. This can increase your chance of falling. Ask your doctor what other things that you can do to help prevent falls. This information is not intended to replace advice given to you by your health care provider. Make sure you discuss any questions you have with your health care provider. Document Released: 07/21/2009 Document Revised: 03/01/2016 Document Reviewed: 10/29/2014 Elsevier Interactive Patient Education  2017 [employer].

## 2021-07-20 DIAGNOSIS — M810 Age-related osteoporosis without current pathological fracture: Secondary | ICD-10-CM | POA: Diagnosis not present

## 2021-07-20 DIAGNOSIS — Z780 Asymptomatic menopausal state: Secondary | ICD-10-CM | POA: Diagnosis not present

## 2021-07-20 DIAGNOSIS — Z1231 Encounter for screening mammogram for malignant neoplasm of breast: Secondary | ICD-10-CM | POA: Diagnosis not present

## 2021-07-20 DIAGNOSIS — M8588 Other specified disorders of bone density and structure, other site: Secondary | ICD-10-CM | POA: Diagnosis not present

## 2021-07-20 LAB — HM MAMMOGRAPHY

## 2021-07-20 LAB — HM DEXA SCAN

## 2021-07-26 ENCOUNTER — Telehealth: Payer: Self-pay

## 2021-07-26 ENCOUNTER — Encounter: Payer: Self-pay | Admitting: Internal Medicine

## 2021-07-26 DIAGNOSIS — M810 Age-related osteoporosis without current pathological fracture: Secondary | ICD-10-CM

## 2021-07-26 NOTE — Telephone Encounter (Signed)
The pt was notified that her bone density results shows osteoporosis and does she want to see a specialist. The patient said yes.  The pt was told she would be referred to Dr. Estanislado Pandy.

## 2021-07-28 ENCOUNTER — Other Ambulatory Visit: Payer: Self-pay | Admitting: Internal Medicine

## 2021-07-28 DIAGNOSIS — Z79899 Other long term (current) drug therapy: Secondary | ICD-10-CM

## 2021-07-31 ENCOUNTER — Ambulatory Visit
Admission: RE | Admit: 2021-07-31 | Discharge: 2021-07-31 | Disposition: A | Discharge status: Home / Self Care | Payer: Medicare Other | Source: Ambulatory Visit | Attending: Internal Medicine | Admitting: Internal Medicine

## 2021-07-31 DIAGNOSIS — R935 Abnormal findings on diagnostic imaging of other abdominal regions, including retroperitoneum: Secondary | ICD-10-CM

## 2021-07-31 DIAGNOSIS — N281 Cyst of kidney, acquired: Secondary | ICD-10-CM | POA: Diagnosis not present

## 2021-07-31 DIAGNOSIS — K449 Diaphragmatic hernia without obstruction or gangrene: Secondary | ICD-10-CM | POA: Diagnosis not present

## 2021-07-31 DIAGNOSIS — K838 Other specified diseases of biliary tract: Secondary | ICD-10-CM | POA: Diagnosis not present

## 2021-07-31 DIAGNOSIS — K869 Disease of pancreas, unspecified: Secondary | ICD-10-CM | POA: Diagnosis not present

## 2021-07-31 MED ORDER — GADOBENATE DIMEGLUMINE 529 MG/ML IV SOLN
18.0000 mL | Freq: Once | INTRAVENOUS | Status: AC | PRN
  Administered 2021-07-31: 18 mL via INTRAVENOUS

## 2021-08-01 ENCOUNTER — Encounter: Payer: Self-pay | Admitting: Internal Medicine

## 2021-08-05 ENCOUNTER — Other Ambulatory Visit: Payer: Medicare Other

## 2021-09-04 NOTE — Progress Notes (Signed)
Office Visit Note  Patient: [name]             Date of Birth: 29-Aug-1933           MRN: [id number]             PCP: Glendale Chard, MD Referring: Glendale Chard, MD Visit Date: 09/18/2021 Occupation: @GUAROCC @  Subjective:  Treatment of osteoporosis   History of Present Illness: [name] is a 87 y.o. female with a history of osteoarthritis and osteoporosis.  She was seen in consultation per request of Dr. Baird Cancer for the evaluation and treatment of osteoporosis.  According to the patient she has not taken any treatment for osteoporosis in the past.  She has been taking calcium with vitamin D on a regular basis.  She does minimal exercise due to joint issues.  She has history of bilateral total hip replacement and left total knee replacement.  She states she has arthritis in her right knee joint but she does not want surgery.  She is also noticed pain and stiffness in her bilateral hands over the last few years.  She has history of hypothyroidism and is on methimazole.  She denies any history of stress fractures.  There is no family history of stress fracture or osteoporosis.  Activities of Daily Living:  Patient reports morning stiffness for 10 minutes.   Patient Reports nocturnal pain.  Difficulty dressing/grooming: Denies Difficulty climbing stairs: Reports Difficulty getting out of chair: Denies Difficulty using hands for taps, buttons, cutlery, and/or writing: Reports  Review of Systems  Constitutional:  Negative for fatigue.  HENT:  Negative for mouth dryness.   Eyes:  Positive for dryness.  Respiratory:  Negative for difficulty breathing.   Cardiovascular:  Negative for chest pain.  Gastrointestinal:  Positive for constipation.  Endocrine: Positive for increased urination.  Genitourinary:  Negative for difficulty urinating.  Musculoskeletal:  Positive for joint pain, gait problem, joint pain and morning stiffness.  Skin:  Negative for rash.   Allergic/Immunologic: Negative for susceptible to infections.  Neurological:  Negative for numbness.  Hematological:  Positive for bruising/bleeding tendency.  Psychiatric/Behavioral:  Negative for sleep disturbance.    PMFS History:  Patient Active Problem List   Diagnosis Date Noted   Dyspepsia 01/01/2021   Atherosclerosis of aorta ([place]) 70/96/2836   Diastolic CHF, chronic (HCC) 12/18/2020   Quadriceps weakness 12/06/2020   S/P total knee arthroplasty, left 03/09/2020   History of total hip arthroplasty, right 08/21/2019   Intermittent claudication ([place]) 07/08/2019   Hypertensive nephropathy 12/15/2018   Chronic renal disease, stage II 12/15/2018   H/O total hip arthroplasty, left 11/07/2017   Presence of left artificial hip joint 05/14/2017   Preop cardiovascular exam 03/12/2017   Cough    Abdominal pain    Symptomatic cholelithiasis 07/01/2016   Nonspecific ST-T wave electrocardiographic changes    Lower leg edema 02/16/2015   Obesity (BMI 30.0-34.9) 02/16/2015   Essential hypertension 01/11/2012   Hyperthyroidism 01/11/2012   Hypercholesteremia 01/11/2012   H/O vaginal hysterectomy 1974 01/11/2012    Class: History of   S/P removal of lung  partial R lobe  2010 01/11/2012   History of hernia repair  R ing. 1975 01/11/2012   BACK PAIN 11/12/2007   DYSPNEA 11/11/2007    Past Medical History:  Diagnosis Date   Aortic stenosis    Echo 07/05/16: Very mild AS, Mean gradient 10 mm Hg, Peak gradient (S) 19 mmHg    Arthritis    left  hip and back   Bronchiectasis    isolated to RML; status post right middle lobe partial resection.   Complication of anesthesia    hard to wake up   Dyspnea    Gall stones    GERD (gastroesophageal reflux disease)    history    H/O osteoporosis    Hypertension    Hyperthyroidism    endocrinologist - Dr. Chalmers Cater   Pneumonia    Yeast infection     Family History  Problem Relation Age of Onset   Hypertension Mother    CVA Mother 61    Heart attack Sister    Diabetes Sister    Healthy Father    Heart attack Brother    Heart attack Brother    Past Surgical History:  Procedure Laterality Date   ABDOMINAL HYSTERECTOMY  1974   CHOLECYSTECTOMY N/A 07/02/2016   Procedure: LAPAROSCOPIC CHOLECYSTECTOMY;  Surgeon: Stark Klein, MD;  Location: [place];  Service: General;  Laterality: N/A;   COLONOSCOPY     ENDOSCOPIC RETROGRADE CHOLANGIOPANCREATOGRAPHY (ERCP) WITH PROPOFOL N/A 08/07/2018   Procedure: ENDOSCOPIC RETROGRADE CHOLANGIOPANCREATOGRAPHY (ERCP) WITH PROPOFOL;  Surgeon: Carol Ada, MD;  Location: [facility];  Service: Endoscopy;  Laterality: N/A;   EYE SURGERY     stye removed   HARDWARE REMOVAL Left 02/24/2020   Procedure: Hardware Removal, left knee;  Surgeon: [name], MD;  Location: [place];  Service: Orthopedics;  Laterality: Left;   HERNIA REPAIR  1975   KNEE SURGERY Left    LUNG REMOVAL, PARTIAL  208   RML   NM MYOVIEW LTD  06/2011   Normal EF. No ischemia or infarction.   SPHINCTEROTOMY  08/07/2018   Procedure: SPHINCTEROTOMY;  Surgeon: Carol Ada, MD;  Location: [facility];  Service: Endoscopy;;  balloon sweep   STERIOD INJECTION Right 02/24/2020   Procedure: RIGHT KNEE STEROID INTRA-ARTICULAR MARCAINE/DEPO-MEDROL INJECTION UNDER ANESTHESIA;  Surgeon: [name], MD;  Location: [place];  Service: Orthopedics;  Laterality: Right;   TOTAL HIP ARTHROPLASTY Left 04/05/2017   Procedure: LEFT TOTAL HIP ARTHROPLASTY ANTERIOR APPROACH;  Surgeon: [name], MD;  Location: [place];  Service: Orthopedics;  Laterality: Left;   TOTAL HIP ARTHROPLASTY Right 12/19/2018   TOTAL HIP ARTHROPLASTY Right 12/19/2018   Procedure: RIGHT TOTAL HIP ARTHROPLASTY-DIRECT ANTERIOR APPROACH;  Surgeon: [name], MD;  Location: [place];  Service: Orthopedics;  Laterality: Right;   TOTAL KNEE ARTHROPLASTY Left 02/24/2020   Procedure: LEFT TOTAL KNEE ARTHROPLASTY;  Surgeon: [name], MD;  Location: [facility];  Service:  Orthopedics;  Laterality: Left;   TRANSTHORACIC ECHOCARDIOGRAM  06/2011   Mild concentric LVH. Normal EF with impaired relaxation. Mildly elevated RV pressures of 30 and 40 mmHg.   TRANSTHORACIC ECHOCARDIOGRAM  06/2016   normal LV size and function. EF 60-65% with GRD 2 DD. Mild aortic stenosis. Mild LA dilation. Mild to moderately increased PA pressures (46 mmHg).   TUBAL LIGATION     Social History   Social History Narrative   She is a widowed mother of 45, grandmother for, a great-grandmother 75.   She has been trying to do more exercise than before, but has been injured recently by her back pain.   She never smoked, doesn't drink alcohol.   Immunization History  Administered Date(s) Administered   PFIZER(Purple Top)SARS-COV-2 Vaccination 12/05/2019, 12/30/2019, 09/08/2020     Objective: Vital Signs: BP 136/65 (BP Location: Left Arm, Patient Position: Sitting, Cuff Size: Normal)   Pulse (!) 58  Resp 20   Ht 5' 1.2" (1.554 m)   Wt 197 lb (89.4 kg)   BMI 36.98 kg/m    Physical Exam Vitals and nursing note reviewed.  Constitutional:      Appearance: She is well-developed.  HENT:     Head: Normocephalic and atraumatic.  Eyes:     Conjunctiva/sclera: Conjunctivae normal.  Cardiovascular:     Rate and Rhythm: Normal rate and regular rhythm.     Heart sounds: Normal heart sounds.  Pulmonary:     Effort: Pulmonary effort is normal.     Breath sounds: Normal breath sounds.  Abdominal:     General: Bowel sounds are normal.     Palpations: Abdomen is soft.  Musculoskeletal:     Cervical back: Normal range of motion.  Lymphadenopathy:     Cervical: No cervical adenopathy.  Skin:    General: Skin is warm and dry.     Capillary Refill: Capillary refill takes less than 2 seconds.  Neurological:     Mental Status: She is alert and oriented to person, place, and time.  Psychiatric:        Behavior: Behavior normal.     Musculoskeletal Exam: C-spine was in good range of  motion.  Shoulder joints and elbow joints with good range of motion.  She had DIP and PIP thickening with subluxation of her right second digit DIP joint.  Hip joints were difficult to assess in the sitting position which are replaced.  Left knee joint is replaced with limited extension.  She had discomfort range of motion of her right knee joint without any warmth swelling or effusion.  There was no tenderness over ankles or MTPs.  CDAI Exam: CDAI Score: -- Patient Global: --; Provider Global: -- Swollen: --; Tender: -- Joint Exam 09/18/2021   No joint exam has been documented for this visit   There is currently no information documented on the homunculus. Go to the Rheumatology activity and complete the homunculus joint exam.  Investigation: No additional findings.  Imaging: No results found.  Recent Labs: Lab Results  Component Value Date   WBC 4.9 06/01/2021   HGB 12.5 06/01/2021   PLT 397 06/01/2021   NA 142 06/01/2021   K 4.9 06/01/2021   CL 104 06/01/2021   CO2 23 06/01/2021   GLUCOSE 84 06/01/2021   BUN 17 06/01/2021   CREATININE 0.74 06/01/2021   BILITOT 0.4 06/01/2021   ALKPHOS 61 06/01/2021   AST 17 06/01/2021   ALT 6 06/01/2021   PROT 7.7 06/01/2021   ALBUMIN 4.0 06/01/2021   CALCIUM 9.5 06/01/2021   GFRAA 80 06/28/2020    Speciality Comments: No specialty comments available.  Procedures:  No procedures performed Allergies: Aleve [naproxen], Ibuprofen, and Naproxen sodium   Assessment / Plan:     Visit Diagnoses: Age-related osteoporosis without current pathological fracture -detailed counseling guarding osteoporosis was provided.  Increased risk of fracture from untreated osteoporosis was emphasized.  Different treatment options and their side effects were discussed.  Patient is not interested in taking oral bisphosphonates that she is concerned about the side effects of reflux.  I also discussed the possibility of IV Reclast in the hospital which she  declined.  I gave her the option of subcutaneous Prolia.  Indications side effects contraindications of Prolia were discussed at length.  I also emphasized that if she starts Prolia she will have to take it lifelong.  She states at this age she is not much interested in the  treatment for osteoporosis.  She will have to think more about it.  I placed the information in her AVS.  I will also obtain baseline labs today.  If she is in agreement to proceed with Prolia we will apply for Prolia.  Patient stated that she will contact us.  Plan: Parathyroid hormone, intact (no Ca), VITAMIN D 25 Hydroxy (Vit-D Deficiency, Fractures), Serum protein electrophoresis with reflex  Medication management - Plan: CBC with Differential/Platelet, COMPLETE METABOLIC PANEL WITH GFR  Primary osteoarthritis of both hands-she has severe osteoarthritis in her hands with DIP and PIP thickening.  She has subluxation of right second DIP joint.  Joint protection muscle strengthening was discussed.  A handout on hand exercises was given.  Status post bilateral total hip replacement-she had bilateral total hip replacement by Dr. Lorin Mercy.  She has some stiffness and some discomfort.  Status post total left knee replacement-she had surgery by Dr. Lorin Mercy in May 2021.  She is recovering well from that.  She continues to have discomfort in her right knee joint.  She does not want to have right knee surgery.  She states she goes to the opiates on a as needed basis for injections.  She mobilizes with the help of a cane.  Essential hypertension-blood pressure was normal today.  Other medical problems are listed as follows:  Diastolic CHF, chronic (HCC)  Atherosclerosis of aorta ([place])  Hypercholesteremia  Hypertensive nephropathy  Hyperthyroidism-she is on methimazole.  S/P removal of lung  partial R lobe  2010  Intermittent claudication ([place])  Orders: Orders Placed This Encounter  Procedures   CBC with Differential/Platelet    COMPLETE METABOLIC PANEL WITH GFR   Parathyroid hormone, intact (no Ca)   VITAMIN D 25 Hydroxy (Vit-D Deficiency, Fractures)   Serum protein electrophoresis with reflex    No orders of the defined types were placed in this encounter.    Follow-Up Instructions: Return for Osteoporosis, Osteoarthritis.   Bo Merino, MD  Note - This record has been created using Editor, commissioning.  Chart creation errors have been sought, but may not always  have been located. Such creation errors do not reflect on  the standard of medical care.

## 2021-09-18 ENCOUNTER — Ambulatory Visit (INDEPENDENT_AMBULATORY_CARE_PROVIDER_SITE_OTHER): Payer: Medicare Other | Admitting: Rheumatology

## 2021-09-18 ENCOUNTER — Other Ambulatory Visit: Payer: Self-pay

## 2021-09-18 ENCOUNTER — Encounter: Payer: Self-pay | Admitting: Rheumatology

## 2021-09-18 VITALS — BP 136/65 | HR 58 | Resp 20 | Ht 61.2 in | Wt 197.0 lb

## 2021-09-18 DIAGNOSIS — M810 Age-related osteoporosis without current pathological fracture: Secondary | ICD-10-CM

## 2021-09-18 DIAGNOSIS — Z79899 Other long term (current) drug therapy: Secondary | ICD-10-CM | POA: Diagnosis not present

## 2021-09-18 DIAGNOSIS — I10 Essential (primary) hypertension: Secondary | ICD-10-CM

## 2021-09-18 DIAGNOSIS — E0590 Thyrotoxicosis, unspecified without thyrotoxic crisis or storm: Secondary | ICD-10-CM

## 2021-09-18 DIAGNOSIS — M19041 Primary osteoarthritis, right hand: Secondary | ICD-10-CM | POA: Diagnosis not present

## 2021-09-18 DIAGNOSIS — I5032 Chronic diastolic (congestive) heart failure: Secondary | ICD-10-CM | POA: Diagnosis not present

## 2021-09-18 DIAGNOSIS — I129 Hypertensive chronic kidney disease with stage 1 through stage 4 chronic kidney disease, or unspecified chronic kidney disease: Secondary | ICD-10-CM

## 2021-09-18 DIAGNOSIS — E7800 Pure hypercholesterolemia, unspecified: Secondary | ICD-10-CM

## 2021-09-18 DIAGNOSIS — I700 Atherosclerosis of aorta: Secondary | ICD-10-CM | POA: Diagnosis not present

## 2021-09-18 DIAGNOSIS — Z96643 Presence of artificial hip joint, bilateral: Secondary | ICD-10-CM

## 2021-09-18 DIAGNOSIS — M19042 Primary osteoarthritis, left hand: Secondary | ICD-10-CM

## 2021-09-18 DIAGNOSIS — Z96652 Presence of left artificial knee joint: Secondary | ICD-10-CM | POA: Diagnosis not present

## 2021-09-18 DIAGNOSIS — I739 Peripheral vascular disease, unspecified: Secondary | ICD-10-CM

## 2021-09-18 DIAGNOSIS — Z902 Acquired absence of lung [part of]: Secondary | ICD-10-CM | POA: Diagnosis not present

## 2021-09-18 NOTE — Patient Instructions (Addendum)
Denosumab injection What is this medication? DENOSUMAB (den oh sue mab) slows bone breakdown. Prolia is used to treat osteoporosis in women after menopause and in men, and in people who are taking corticosteroids for 6 months or more. Xgeva is used to treat a high calcium level due to cancer and to prevent bone fractures and other bone problems caused by multiple myeloma or cancer bone metastases. Xgeva is also used to treat giant cell tumor of the bone. This medicine may be used for other purposes; ask your health care provider or pharmacist if you have questions. COMMON BRAND NAME(S): Prolia, XGEVA What should I tell my care team before I take this medication? They need to know if you have any of these conditions: dental disease having surgery or tooth extraction infection kidney disease low levels of calcium or Vitamin D in the blood malnutrition on hemodialysis skin conditions or sensitivity thyroid or parathyroid disease an unusual reaction to denosumab, other medicines, foods, dyes, or preservatives pregnant or trying to get pregnant breast-feeding How should I use this medication? This medicine is for injection under the skin. It is given by a health care professional in a hospital or clinic setting. A special MedGuide will be given to you before each treatment. Be sure to read this information carefully each time. For Prolia, talk to your pediatrician regarding the use of this medicine in children. Special care may be needed. For Xgeva, talk to your pediatrician regarding the use of this medicine in children. While this drug may be prescribed for children as young as 13 years for selected conditions, precautions do apply. Overdosage: If you think you have taken too much of this medicine contact a poison control center or emergency room at once. NOTE: This medicine is only for you. Do not share this medicine with others. What if I miss a dose? It is important not to miss your dose.  Call your doctor or health care professional if you are unable to keep an appointment. What may interact with this medication? Do not take this medicine with any of the following medications: other medicines containing denosumab This medicine may also interact with the following medications: medicines that lower your chance of fighting infection steroid medicines like prednisone or cortisone This list may not describe all possible interactions. Give your health care provider a list of all the medicines, herbs, non-prescription drugs, or dietary supplements you use. Also tell them if you smoke, drink alcohol, or use illegal drugs. Some items may interact with your medicine. What should I watch for while using this medication? Visit your doctor or health care professional for regular checks on your progress. Your doctor or health care professional may order blood tests and other tests to see how you are doing. Call your doctor or health care professional for advice if you get a fever, chills or sore throat, or other symptoms of a cold or flu. Do not treat yourself. This drug may decrease your body's ability to fight infection. Try to avoid being around people who are sick. You should make sure you get enough calcium and vitamin D while you are taking this medicine, unless your doctor tells you not to. Discuss the foods you eat and the vitamins you take with your health care professional. See your dentist regularly. Brush and floss your teeth as directed. Before you have any dental work done, tell your dentist you are receiving this medicine. Do not become pregnant while taking this medicine or for 5 months after   stopping it. Talk with your doctor or health care professional about your birth control options while taking this medicine. Women should inform their doctor if they wish to become pregnant or think they might be pregnant. There is a potential for serious side effects to an unborn child. Talk to  your health care professional or pharmacist for more information. What side effects may I notice from receiving this medication? Side effects that you should report to your doctor or health care professional as soon as possible: allergic reactions like skin rash, itching or hives, swelling of the face, lips, or tongue bone pain breathing problems dizziness jaw pain, especially after dental work redness, blistering, peeling of the skin signs and symptoms of infection like fever or chills; cough; sore throat; pain or trouble passing urine signs of low calcium like fast heartbeat, muscle cramps or muscle pain; pain, tingling, numbness in the hands or feet; seizures unusual bleeding or bruising unusually weak or tired Side effects that usually do not require medical attention (report to your doctor or health care professional if they continue or are bothersome): constipation diarrhea headache joint pain loss of appetite muscle pain runny nose tiredness upset stomach This list may not describe all possible side effects. Call your doctor for medical advice about side effects. You may report side effects to FDA at 1-800-FDA-1088. Where should I keep my medication? This medicine is only given in a clinic, doctor's office, or other health care setting and will not be stored at home. NOTE: This sheet is a summary. It may not cover all possible information. If you have questions about this medicine, talk to your doctor, pharmacist, or health care provider.  2022 Elsevier/Gold Standard (2018-01-31 00:00:00)  Hand Exercises Hand exercises can be helpful for almost anyone. These exercises can strengthen the hands, improve flexibility and movement, and increase blood flow to the hands. These results can make work and daily tasks easier. Hand exercises can be especially helpful for people who have joint pain from arthritis or have nerve damage from overuse (carpal tunnel syndrome). These exercises  can also help people who have injured a hand. Exercises Most of these hand exercises are gentle stretching and motion exercises. It is usually safe to do them often throughout the day. Warming up your hands before exercise may help to reduce stiffness. You can do this with gentle massage or by placing your hands in warm water for 10-15 minutes. It is normal to feel some stretching, pulling, tightness, or mild discomfort as you begin new exercises. This will gradually improve. Stop an exercise right away if you feel sudden, severe pain or your pain gets worse. Ask your health care provider which exercises are best for you. Knuckle bend or "claw" fist  Stand or sit with your arm, hand, and all five fingers pointed straight up. Make sure to keep your wrist straight during the exercise. Gently bend your fingers down toward your palm until the tips of your fingers are touching the top of your palm. Keep your big knuckle straight and just bend the small knuckles in your fingers. Hold this position for __________ seconds. Straighten (extend) your fingers back to the starting position. Repeat this exercise 5-10 times with each hand. Full finger fist  Stand or sit with your arm, hand, and all five fingers pointed straight up. Make sure to keep your wrist straight during the exercise. Gently bend your fingers into your palm until the tips of your fingers are touching the middle of your  palm. Hold this position for __________ seconds. Extend your fingers back to the starting position, stretching every joint fully. Repeat this exercise 5-10 times with each hand. Straight fist Stand or sit with your arm, hand, and all five fingers pointed straight up. Make sure to keep your wrist straight during the exercise. Gently bend your fingers at the big knuckle, where your fingers meet your hand, and the middle knuckle. Keep the knuckle at the tips of your fingers straight and try to touch the bottom of your  palm. Hold this position for __________ seconds. Extend your fingers back to the starting position, stretching every joint fully. Repeat this exercise 5-10 times with each hand. Tabletop  Stand or sit with your arm, hand, and all five fingers pointed straight up. Make sure to keep your wrist straight during the exercise. Gently bend your fingers at the big knuckle, where your fingers meet your hand, as far down as you can while keeping the small knuckles in your fingers straight. Think of forming a tabletop with your fingers. Hold this position for __________ seconds. Extend your fingers back to the starting position, stretching every joint fully. Repeat this exercise 5-10 times with each hand. Finger spread  Place your hand flat on a table with your palm facing down. Make sure your wrist stays straight as you do this exercise. Spread your fingers and thumb apart from each other as far as you can until you feel a gentle stretch. Hold this position for __________ seconds. Bring your fingers and thumb tight together again. Hold this position for __________ seconds. Repeat this exercise 5-10 times with each hand. Making circles  Stand or sit with your arm, hand, and all five fingers pointed straight up. Make sure to keep your wrist straight during the exercise. Make a circle by touching the tip of your thumb to the tip of your index finger. Hold for __________ seconds. Then open your hand wide. Repeat this motion with your thumb and each finger on your hand. Repeat this exercise 5-10 times with each hand. Thumb motion  Sit with your forearm resting on a table and your wrist straight. Your thumb should be facing up toward the ceiling. Keep your fingers relaxed as you move your thumb. Lift your thumb up as high as you can toward the ceiling. Hold for __________ seconds. Bend your thumb across your palm as far as you can, reaching the tip of your thumb for the small finger (pinkie) side of your  palm. Hold for __________ seconds. Repeat this exercise 5-10 times with each hand. Grip strengthening  Hold a stress ball or other soft ball in the middle of your hand. Slowly increase the pressure, squeezing the ball as much as you can without causing pain. Think of bringing the tips of your fingers into the middle of your palm. All of your finger joints should bend when doing this exercise. Hold your squeeze for __________ seconds, then relax. Repeat this exercise 5-10 times with each hand. Contact a health care provider if: Your hand pain or discomfort gets much worse when you do an exercise. Your hand pain or discomfort does not improve within 2 hours after you exercise. If you have any of these problems, stop doing these exercises right away. Do not do them again unless your health care provider says that you can. Get help right away if: You develop sudden, severe hand pain or swelling. If this happens, stop doing these exercises right away. Do not do them  again unless your health care provider says that you can. This information is not intended to replace advice given to you by your health care provider. Make sure you discuss any questions you have with your health care provider. Document Revised: 01/12/2021 Document Reviewed: 01/12/2021 Elsevier Patient Education  [place].

## 2021-09-20 LAB — CBC WITH DIFFERENTIAL/PLATELET
Absolute Monocytes: 505 cells/uL (ref 200–950)
Basophils Absolute: 59 cells/uL (ref 0–200)
Basophils Relative: 1.2 %
Eosinophils Absolute: 152 cells/uL (ref 15–500)
Eosinophils Relative: 3.1 %
HCT: 36.3 % (ref 35.0–45.0)
Hemoglobin: 12.2 g/dL (ref 11.7–15.5)
Lymphs Abs: 2200 cells/uL (ref 850–3900)
MCH: 27.6 pg (ref 27.0–33.0)
MCHC: 33.6 g/dL (ref 32.0–36.0)
MCV: 82.1 fL (ref 80.0–100.0)
MPV: 10.2 fL (ref 7.5–12.5)
Monocytes Relative: 10.3 %
Neutro Abs: 1985 cells/uL (ref 1500–7800)
Neutrophils Relative %: 40.5 %
Platelets: 366 10*3/uL (ref 140–400)
RBC: 4.42 10*6/uL (ref 3.80–5.10)
RDW: 13.8 % (ref 11.0–15.0)
Total Lymphocyte: 44.9 %
WBC: 4.9 10*3/uL (ref 3.8–10.8)

## 2021-09-20 LAB — PROTEIN ELECTROPHORESIS, SERUM, WITH REFLEX
Albumin ELP: 3.6 g/dL — ABNORMAL LOW (ref 3.8–4.8)
Alpha 1: 0.3 g/dL (ref 0.2–0.3)
Alpha 2: 0.8 g/dL (ref 0.5–0.9)
Beta 2: 0.6 g/dL — ABNORMAL HIGH (ref 0.2–0.5)
Beta Globulin: 0.5 g/dL (ref 0.4–0.6)
Gamma Globulin: 1.7 g/dL (ref 0.8–1.7)
Total Protein: 7.6 g/dL (ref 6.1–8.1)

## 2021-09-20 LAB — VITAMIN D 25 HYDROXY (VIT D DEFICIENCY, FRACTURES): Vit D, 25-Hydroxy: 42 ng/mL (ref 30–100)

## 2021-09-20 LAB — COMPLETE METABOLIC PANEL WITH GFR
AG Ratio: 0.9 (calc) — ABNORMAL LOW (ref 1.0–2.5)
ALT: 9 U/L (ref 6–29)
AST: 17 U/L (ref 10–35)
Albumin: 3.7 g/dL (ref 3.6–5.1)
Alkaline phosphatase (APISO): 52 U/L (ref 37–153)
BUN: 15 mg/dL (ref 7–25)
CO2: 30 mmol/L (ref 20–32)
Calcium: 9.2 mg/dL (ref 8.6–10.4)
Chloride: 105 mmol/L (ref 98–110)
Creat: 0.76 mg/dL (ref 0.60–0.95)
Globulin: 3.9 g/dL (calc) — ABNORMAL HIGH (ref 1.9–3.7)
Glucose, Bld: 85 mg/dL (ref 65–99)
Potassium: 4.6 mmol/L (ref 3.5–5.3)
Sodium: 140 mmol/L (ref 135–146)
Total Bilirubin: 0.5 mg/dL (ref 0.2–1.2)
Total Protein: 7.6 g/dL (ref 6.1–8.1)
eGFR: 76 mL/min/{1.73_m2} (ref >=60)

## 2021-09-20 LAB — PARATHYROID HORMONE, INTACT (NO CA): PTH: 21 pg/mL (ref 16–77)

## 2021-09-20 NOTE — Progress Notes (Signed)
CBC is normal, CMP is normal, PTH is normal, vitamin D is normal, SPEP is negative.  Patient will let us know when she is ready to start on the treatment for osteoporosis.

## 2021-09-23 DIAGNOSIS — Z20828 Contact with and (suspected) exposure to other viral communicable diseases: Secondary | ICD-10-CM | POA: Diagnosis not present

## 2021-09-29 NOTE — Progress Notes (Signed)
Office Visit Note  Patient: [name]             Date of Birth: [birth year]           MRN: [id number]             PCP: Glendale Chard, MD Referring: Glendale Chard, MD Visit Date: 10/10/2021 Occupation: @GUAROCC @  Subjective:  Treatment of osteoporosis   History of Present Illness: [name] is a 87 y.o. female with a history of osteoporosis and osteoarthritis.  Patient states she reviewed the information about osteoporosis, Reclast and Prolia.  She decided not to take any other medications.  She has been taking calcium on a regular basis and also takes vitamin D.  She continues to have some pain and stiffness in her hands.  She has been doing exercises.  She states that her replaced hip joints and knee joint has been doing well.  Activities of Daily Living:  Patient reports morning stiffness for 10 minutes.   Patient Denies nocturnal pain.  Difficulty dressing/grooming: Denies Difficulty climbing stairs: Reports Difficulty getting out of chair: Denies Difficulty using hands for taps, buttons, cutlery, and/or writing: Reports  Review of Systems  Constitutional:  Negative for fatigue, night sweats, weight gain and weight loss.  HENT:  Negative for mouth sores, trouble swallowing, trouble swallowing, mouth dryness and nose dryness.   Eyes:  Positive for dryness. Negative for pain, redness and visual disturbance.  Respiratory:  Negative for cough, shortness of breath and difficulty breathing.   Cardiovascular:  Negative for chest pain, palpitations, hypertension, irregular heartbeat and swelling in legs/feet.  Gastrointestinal:  Positive for constipation. Negative for blood in stool and diarrhea.  Endocrine: Negative for increased urination.  Genitourinary:  Negative for vaginal dryness.  Musculoskeletal:  Positive for joint pain, gait problem, joint pain and morning stiffness. Negative for joint swelling, myalgias, muscle weakness, muscle tenderness and myalgias.  Skin:   Negative for color change, rash, hair loss, skin tightness, ulcers and sensitivity to sunlight.  Allergic/Immunologic: Negative for susceptible to infections.  Neurological:  Negative for dizziness, memory loss, night sweats and weakness.  Hematological:  Negative for swollen glands.  Psychiatric/Behavioral:  Negative for depressed mood and sleep disturbance. The patient is not nervous/anxious.    PMFS History:  Patient Active Problem List   Diagnosis Date Noted   Dyspepsia 01/01/2021   Atherosclerosis of aorta ([place]) 63/87/5643   Diastolic CHF, chronic (HCC) 12/18/2020   Quadriceps weakness 12/06/2020   S/P total knee arthroplasty, left 03/09/2020   History of total hip arthroplasty, right 08/21/2019   Intermittent claudication ([place]) 07/08/2019   Hypertensive nephropathy 12/15/2018   Chronic renal disease, stage II 12/15/2018   H/O total hip arthroplasty, left 11/07/2017   Presence of left artificial hip joint 05/14/2017   Preop cardiovascular exam 03/12/2017   Cough    Abdominal pain    Symptomatic cholelithiasis 07/01/2016   Nonspecific ST-T wave electrocardiographic changes    Lower leg edema 02/16/2015   Obesity (BMI 30.0-34.9) 02/16/2015   Essential hypertension 01/11/2012   Hyperthyroidism 01/11/2012   Hypercholesteremia 01/11/2012   H/O vaginal hysterectomy 1974 01/11/2012    Class: History of   S/P removal of lung  partial R lobe  2010 01/11/2012   History of hernia repair  R ing. 1975 01/11/2012   BACK PAIN 11/12/2007   DYSPNEA 11/11/2007    Past Medical History:  Diagnosis Date   Aortic stenosis    Echo 07/05/16: Very mild AS, Mean gradient 10  mm Hg, Peak gradient (S) 19 mmHg    Arthritis    left hip and back   Bronchiectasis    isolated to RML; status post right middle lobe partial resection.   Complication of anesthesia    hard to wake up   Dyspnea    Gall stones    GERD (gastroesophageal reflux disease)    history    H/O osteoporosis    Hypertension     Hyperthyroidism    endocrinologist - Dr. Chalmers Cater   Pneumonia    Yeast infection     Family History  Problem Relation Age of Onset   Hypertension Mother    CVA Mother 30   Heart attack Sister    Diabetes Sister    Healthy Father    Heart attack Brother    Heart attack Brother    Past Surgical History:  Procedure Laterality Date   ABDOMINAL HYSTERECTOMY  1974   CHOLECYSTECTOMY N/A 07/02/2016   Procedure: LAPAROSCOPIC CHOLECYSTECTOMY;  Surgeon: Stark Klein, MD;  Location: [place];  Service: General;  Laterality: N/A;   COLONOSCOPY     ENDOSCOPIC RETROGRADE CHOLANGIOPANCREATOGRAPHY (ERCP) WITH PROPOFOL N/A 08/07/2018   Procedure: ENDOSCOPIC RETROGRADE CHOLANGIOPANCREATOGRAPHY (ERCP) WITH PROPOFOL;  Surgeon: Carol Ada, MD;  Location: [facility];  Service: Endoscopy;  Laterality: N/A;   EYE SURGERY     stye removed   HARDWARE REMOVAL Left 02/24/2020   Procedure: Hardware Removal, left knee;  Surgeon: [name], MD;  Location: [place];  Service: Orthopedics;  Laterality: Left;   HERNIA REPAIR  1975   KNEE SURGERY Left    LUNG REMOVAL, PARTIAL  208   RML   NM MYOVIEW LTD  06/2011   Normal EF. No ischemia or infarction.   SPHINCTEROTOMY  08/07/2018   Procedure: SPHINCTEROTOMY;  Surgeon: Carol Ada, MD;  Location: [facility];  Service: Endoscopy;;  balloon sweep   STERIOD INJECTION Right 02/24/2020   Procedure: RIGHT KNEE STEROID INTRA-ARTICULAR MARCAINE/DEPO-MEDROL INJECTION UNDER ANESTHESIA;  Surgeon: [name], MD;  Location: [place];  Service: Orthopedics;  Laterality: Right;   TOTAL HIP ARTHROPLASTY Left 04/05/2017   Procedure: LEFT TOTAL HIP ARTHROPLASTY ANTERIOR APPROACH;  Surgeon: [name], MD;  Location: [place];  Service: Orthopedics;  Laterality: Left;   TOTAL HIP ARTHROPLASTY Right 12/19/2018   TOTAL HIP ARTHROPLASTY Right 12/19/2018   Procedure: RIGHT TOTAL HIP ARTHROPLASTY-DIRECT ANTERIOR APPROACH;  Surgeon: [name], MD;  Location: [place];  Service:  Orthopedics;  Laterality: Right;   TOTAL KNEE ARTHROPLASTY Left 02/24/2020   Procedure: LEFT TOTAL KNEE ARTHROPLASTY;  Surgeon: [name], MD;  Location: [place];  Service: Orthopedics;  Laterality: Left;   TRANSTHORACIC ECHOCARDIOGRAM  06/2011   Mild concentric LVH. Normal EF with impaired relaxation. Mildly elevated RV pressures of 30 and 40 mmHg.   TRANSTHORACIC ECHOCARDIOGRAM  06/2016   normal LV size and function. EF 60-65% with GRD 2 DD. Mild aortic stenosis. Mild LA dilation. Mild to moderately increased PA pressures (46 mmHg).   TUBAL LIGATION     Social History   Social History Narrative   She is a widowed mother of 70, grandmother for, a great-grandmother 32.   She has been trying to do more exercise than before, but has been injured recently by her back pain.   She never smoked, doesn't drink alcohol.   Immunization History  Administered Date(s) Administered   PFIZER(Purple Top)SARS-COV-2 Vaccination 12/05/2019, 12/30/2019, 09/08/2020     Objective: Vital Signs: BP 107/69 (BP  Location: Right Arm, Patient Position: Sitting, Cuff Size: Large)    Pulse 71    Ht 5\' 1"  (1.549 m)    Wt 195 lb (88.5 kg)    BMI 36.84 kg/m    Physical Exam Vitals and nursing note reviewed.  Constitutional:      Appearance: She is well-developed.  HENT:     Head: Normocephalic and atraumatic.  Eyes:     Conjunctiva/sclera: Conjunctivae normal.  Cardiovascular:     Rate and Rhythm: Normal rate and regular rhythm.     Heart sounds: Normal heart sounds.  Pulmonary:     Effort: Pulmonary effort is normal.     Breath sounds: Normal breath sounds.  Abdominal:     General: Bowel sounds are normal.     Palpations: Abdomen is soft.  Musculoskeletal:     Cervical back: Normal range of motion.  Lymphadenopathy:     Cervical: No cervical adenopathy.  Skin:    General: Skin is warm and dry.     Capillary Refill: Capillary refill takes less than 2 seconds.  Neurological:     Mental Status: She is  alert and oriented to person, place, and time.  Psychiatric:        Behavior: Behavior normal.     Musculoskeletal Exam: She had good range of motion of her cervical spine.  Shoulder joints and elbow joints with good range of motion.  There was no tenderness over wrist joints.  She had bilateral PIP and DIP thickening with no synovitis.  Subluxation of the right second DIP joint was noted.  Hip joints are replaced.  Knee joints are in good range of motion.  Her left knee joint is replaced.  There is no tenderness over ankles or MTPs.  CDAI Exam: CDAI Score: -- Patient Global: --; Provider Global: -- Swollen: --; Tender: -- Joint Exam 10/10/2021   No joint exam has been documented for this visit   There is currently no information documented on the homunculus. Go to the Rheumatology activity and complete the homunculus joint exam.  Investigation: No additional findings.  Imaging: No results found.  Recent Labs: Lab Results  Component Value Date   WBC 4.9 09/18/2021   HGB 12.2 09/18/2021   PLT 366 09/18/2021   NA 140 09/18/2021   K 4.6 09/18/2021   CL 105 09/18/2021   CO2 30 09/18/2021   GLUCOSE 85 09/18/2021   BUN 15 09/18/2021   CREATININE 0.76 09/18/2021   BILITOT 0.5 09/18/2021   ALKPHOS 61 06/01/2021   AST 17 09/18/2021   ALT 9 09/18/2021   PROT 7.6 09/18/2021   PROT 7.6 09/18/2021   ALBUMIN 4.0 06/01/2021   CALCIUM 9.2 09/18/2021   GFRAA 80 06/28/2020   September 18, 2021 SPEP normal, PTH normal, vitamin D 42   Speciality Comments: No specialty comments available.  Procedures:  No procedures performed Allergies: Aleve [naproxen], Ibuprofen, and Naproxen sodium   Assessment / Plan:     Visit Diagnoses: Age-related osteoporosis without current pathological fracture -July 20, 2021 T score -2.8, BMD 0.523 in the left one third radius.  Percentage change -1.0% since 2020.  Patient declined Fosamax, and IV Reclast at the last visit.  She was given a handout on  Prolia to think about.  She states she read about Prolia and is not interested in starting the medication.  She did not have any significant change in her BMD over the last 2 years.  She does not want to take any additional  medications at this point.  She would prefer to wait until her next DEXA scan in 2024.  She will continue calcium and vitamin D.  Need for resistive exercises were discussed.  Increased risk of fracture was also discussed.  Fall prevention was also discussed at length.    Primary osteoarthritis of both hands - She is subluxation of DIP joints.  Joint protection muscle strengthening was discussed.  She has been doing some stretching exercises.  Status post bilateral total hip replacement - By Dr. Lorin Mercy.  She continues to have some stiffness and discomfort.  She mobilizes with the help of a cane.  Status post total left knee replacement - By Dr. Lorin Mercy in May 2021.  She has chronic discomfort.  She mobilizes with the help of a cane.  Essential hypertension-blood pressure was normal today.  Other medical problems are listed as follows:  Diastolic CHF, chronic ([place])  Hypercholesteremia  Atherosclerosis of aorta ([place])  Hypertensive nephropathy  S/P removal of lung  partial R lobe  2010  Hyperthyroidism  Intermittent claudication ([place])  Orders: No orders of the defined types were placed in this encounter.  No orders of the defined types were placed in this encounter.    Follow-Up Instructions: Return in about 2 years (around 10/11/2023).   Bo Merino, MD  Note - This record has been created using Editor, commissioning.  Chart creation errors have been sought, but may not always  have been located. Such creation errors do not reflect on  the standard of medical care.

## 2021-10-10 ENCOUNTER — Ambulatory Visit (INDEPENDENT_AMBULATORY_CARE_PROVIDER_SITE_OTHER): Payer: Medicare Other | Admitting: Rheumatology

## 2021-10-10 ENCOUNTER — Encounter: Payer: Self-pay | Admitting: Rheumatology

## 2021-10-10 ENCOUNTER — Other Ambulatory Visit: Payer: Self-pay

## 2021-10-10 VITALS — BP 107/69 | HR 71 | Ht 61.0 in | Wt 195.0 lb

## 2021-10-10 DIAGNOSIS — I5032 Chronic diastolic (congestive) heart failure: Secondary | ICD-10-CM | POA: Diagnosis not present

## 2021-10-10 DIAGNOSIS — I700 Atherosclerosis of aorta: Secondary | ICD-10-CM | POA: Diagnosis not present

## 2021-10-10 DIAGNOSIS — Z902 Acquired absence of lung [part of]: Secondary | ICD-10-CM

## 2021-10-10 DIAGNOSIS — M19041 Primary osteoarthritis, right hand: Secondary | ICD-10-CM | POA: Diagnosis not present

## 2021-10-10 DIAGNOSIS — M810 Age-related osteoporosis without current pathological fracture: Secondary | ICD-10-CM | POA: Diagnosis not present

## 2021-10-10 DIAGNOSIS — Z96652 Presence of left artificial knee joint: Secondary | ICD-10-CM | POA: Diagnosis not present

## 2021-10-10 DIAGNOSIS — Z96643 Presence of artificial hip joint, bilateral: Secondary | ICD-10-CM

## 2021-10-10 DIAGNOSIS — E7800 Pure hypercholesterolemia, unspecified: Secondary | ICD-10-CM

## 2021-10-10 DIAGNOSIS — I739 Peripheral vascular disease, unspecified: Secondary | ICD-10-CM | POA: Diagnosis not present

## 2021-10-10 DIAGNOSIS — E0590 Thyrotoxicosis, unspecified without thyrotoxic crisis or storm: Secondary | ICD-10-CM | POA: Diagnosis not present

## 2021-10-10 DIAGNOSIS — I10 Essential (primary) hypertension: Secondary | ICD-10-CM

## 2021-10-10 DIAGNOSIS — I129 Hypertensive chronic kidney disease with stage 1 through stage 4 chronic kidney disease, or unspecified chronic kidney disease: Secondary | ICD-10-CM | POA: Diagnosis not present

## 2021-10-10 DIAGNOSIS — M19042 Primary osteoarthritis, left hand: Secondary | ICD-10-CM

## 2021-10-10 NOTE — Patient Instructions (Signed)

## 2021-10-19 ENCOUNTER — Other Ambulatory Visit: Payer: Self-pay | Admitting: Internal Medicine

## 2021-11-13 DIAGNOSIS — E049 Nontoxic goiter, unspecified: Secondary | ICD-10-CM | POA: Diagnosis not present

## 2021-11-13 DIAGNOSIS — E0590 Thyrotoxicosis, unspecified without thyrotoxic crisis or storm: Secondary | ICD-10-CM | POA: Diagnosis not present

## 2021-11-20 DIAGNOSIS — E0590 Thyrotoxicosis, unspecified without thyrotoxic crisis or storm: Secondary | ICD-10-CM | POA: Diagnosis not present

## 2021-12-21 ENCOUNTER — Encounter: Payer: Self-pay | Admitting: Internal Medicine

## 2021-12-21 DIAGNOSIS — H25813 Combined forms of age-related cataract, bilateral: Secondary | ICD-10-CM | POA: Diagnosis not present

## 2021-12-21 DIAGNOSIS — H35363 Drusen (degenerative) of macula, bilateral: Secondary | ICD-10-CM | POA: Diagnosis not present

## 2021-12-21 DIAGNOSIS — H40013 Open angle with borderline findings, low risk, bilateral: Secondary | ICD-10-CM | POA: Diagnosis not present

## 2021-12-21 DIAGNOSIS — H35033 Hypertensive retinopathy, bilateral: Secondary | ICD-10-CM | POA: Diagnosis not present

## 2022-01-02 DIAGNOSIS — Z20822 Contact with and (suspected) exposure to covid-19: Secondary | ICD-10-CM | POA: Diagnosis not present

## 2022-01-04 ENCOUNTER — Encounter: Payer: Self-pay | Admitting: Internal Medicine

## 2022-01-04 ENCOUNTER — Ambulatory Visit (INDEPENDENT_AMBULATORY_CARE_PROVIDER_SITE_OTHER): Payer: Medicare Other | Admitting: Internal Medicine

## 2022-01-04 VITALS — BP 112/78 | HR 54 | Temp 97.5°F | Ht 61.0 in | Wt 186.4 lb

## 2022-01-04 DIAGNOSIS — L819 Disorder of pigmentation, unspecified: Secondary | ICD-10-CM | POA: Diagnosis not present

## 2022-01-04 DIAGNOSIS — Z6835 Body mass index (BMI) 35.0-35.9, adult: Secondary | ICD-10-CM

## 2022-01-04 DIAGNOSIS — Z23 Encounter for immunization: Secondary | ICD-10-CM | POA: Diagnosis not present

## 2022-01-04 DIAGNOSIS — N182 Chronic kidney disease, stage 2 (mild): Secondary | ICD-10-CM

## 2022-01-04 DIAGNOSIS — Z2821 Immunization not carried out because of patient refusal: Secondary | ICD-10-CM | POA: Diagnosis not present

## 2022-01-04 DIAGNOSIS — E7800 Pure hypercholesterolemia, unspecified: Secondary | ICD-10-CM

## 2022-01-04 DIAGNOSIS — I129 Hypertensive chronic kidney disease with stage 1 through stage 4 chronic kidney disease, or unspecified chronic kidney disease: Secondary | ICD-10-CM

## 2022-01-04 DIAGNOSIS — E66812 Obesity, class 2: Secondary | ICD-10-CM

## 2022-01-04 MED ORDER — TETANUS-DIPHTH-ACELL PERTUSSIS 5-2.5-18.5 LF-MCG/0.5 IM SUSP: 0.5000 mL | Freq: Once | INTRAMUSCULAR | 0 refills | Status: AC

## 2022-01-04 MED ORDER — BUSPIRONE HCL 5 MG PO TABS: 5.0000 mg | ORAL_TABLET | Freq: Every day | ORAL | 0 refills | Status: DC | PRN

## 2022-01-04 MED ORDER — METOPROLOL SUCCINATE ER 25 MG PO TB24: 25.0000 mg | ORAL_TABLET | Freq: Every day | ORAL | 3 refills | Status: DC

## 2022-01-04 MED ORDER — LIDOCAINE 5 % EX PTCH: 1.0000 | MEDICATED_PATCH | Freq: Every day | CUTANEOUS | 1 refills | Status: DC | PRN

## 2022-01-04 NOTE — Patient Instructions (Signed)

## 2022-01-04 NOTE — Progress Notes (Signed)
?Rich Brave [place]ittleton,acting as a Education administrator for Maximino Greenland, MD.,have documented all relevant documentation on the behalf of Maximino Greenland, MD,as directed by  Maximino Greenland, MD while in the presence of Maximino Greenland, MD.  ?This visit occurred during the SARS-CoV-2 public health emergency.  Safety protocols were in place, including screening questions prior to the visit, additional usage of staff PPE, and extensive cleaning of exam room while observing appropriate contact time as indicated for disinfecting solutions. ? ?Subjective:  ?  ? Patient ID: [name] , female    DOB: August 29, 1933 , 88 y.o.   MRN: [id number] ? ? ?Chief Complaint  ?Patient presents with  ? Hypertension  ? ? ?HPI ? ?The patient is here today for HTN f/u. She reports compliance with meds. She denies headaches, chest pain and shortness of breath.  ? ?Hypertension ?This is a chronic problem. The current episode started more than 1 year ago. The problem has been gradually improving since onset. The problem is controlled. Pertinent negatives include no blurred vision, chest pain, palpitations or shortness of breath. Risk factors for coronary artery disease include dyslipidemia, obesity and post-menopausal state. Past treatments include beta blockers. The current treatment provides moderate improvement. Compliance problems include exercise.    ? ?Past Medical History:  ?Diagnosis Date  ? Aortic stenosis   ? Echo 07/05/16: Very mild AS, Mean gradient 10 mm Hg, Peak gradient (S) 19 mmHg   ? Arthritis   ? left hip and back  ? Bronchiectasis   ? isolated to RML; status post right middle lobe partial resection.  ? Complication of anesthesia   ? hard to wake up  ? Dyspnea   ? Gall stones   ? GERD (gastroesophageal reflux disease)   ? history   ? H/O osteoporosis   ? Hypertension   ? Hyperthyroidism   ? endocrinologist - Dr. Chalmers Cater  ? Pneumonia   ? Yeast infection   ?  ? ?Family History  ?Problem Relation Age of Onset  ? Hypertension Mother   ? CVA  Mother 47  ? Heart attack Sister   ? Diabetes Sister   ? Healthy Father   ? Heart attack Brother   ? Heart attack Brother   ? ? ? ?Current Outpatient Medications:  ?  Acetaminophen (TYLENOL PO), Take by mouth., Disp: , Rfl:  ?  calcium carbonate (OS-CAL) 600 MG TABS tablet, Take 600 mg by mouth daily., Disp: , Rfl:  ?  cholecalciferol (VITAMIN D3) 25 MCG (1000 UT) tablet, Take 1,000 Units by mouth daily., Disp: , Rfl:  ?  diclofenac Sodium (VOLTAREN) 1 % GEL, APPLY 2 GRAMS 4 TIMES DAILY AS DIRECTED, Disp: 500 g, Rfl: 1 ?  fenofibrate (TRICOR) 145 MG tablet, TAKE 1 TABLET BY MOUTH EVERY DAY, Disp: 90 tablet, Rfl: 3 ?  Flaxseed, Linseed, (FLAXSEED OIL) 1000 MG CAPS, Take 1,000 mg by mouth daily., Disp: , Rfl:  ?  furosemide (LASIX) 20 MG tablet, TAKE 1 TABLET DAILY, Disp: 90 tablet, Rfl: 3 ?  methimazole (TAPAZOLE) 5 MG tablet, Take 2.5 mg by mouth daily. , Disp: , Rfl:  ?  nystatin cream (MYCOSTATIN), APPLY CREAM TO AFFECTED AREA TWICE A DAY AS NEEDED (Patient taking differently: Apply 1 application. topically 2 (two) times daily as needed for dry skin.), Disp: 30 g, Rfl: 23 ?  potassium chloride SA (KLOR-CON M) 20 MEQ tablet, TAKE 1 TABLET BY MOUTH EVERY DAY, Disp: 90 tablet, Rfl: 3 ?  busPIRone (BUSPAR) 5  MG tablet, Take 1 tablet (5 mg total) by mouth daily as needed (anxiety)., Disp: 90 tablet, Rfl: 0 ?  lidocaine (LIDODERM) 5 %, Place 1 patch onto the skin daily as needed (hip pain)., Disp: 90 patch, Rfl: 1 ?  metoprolol succinate (TOPROL-XL) 25 MG 24 hr tablet, Take 1 tablet (25 mg total) by mouth daily., Disp: 90 tablet, Rfl: 3  ? ?Allergies  ?Allergen Reactions  ? Aleve [Naproxen]   ?  Other reaction(s): hallucinations  ? Ibuprofen Other (See Comments)  ?  hallucinations ?Other reaction(s): hallucinations  ? Naproxen Sodium Other (See Comments)  ?  hallucinations  ?  ? ?Review of Systems  ?Constitutional: Negative.   ?Eyes:  Negative for blurred vision.  ?Respiratory: Negative.  Negative for shortness of  breath.   ?Cardiovascular:  Negative for chest pain and palpitations.  ?Gastrointestinal: Negative.   ?Skin:  Positive for color change.  ?     She states skin is darker on her cheeks. She is not sure what triggered her sx. No blisters noted.   ?Neurological: Negative.   ?Psychiatric/Behavioral: Negative.     ? ?Today's Vitals  ? 01/04/22 1001  ?BP: 112/78  ?Pulse: (!) 54  ?Temp: (!) 97.5 ?F (36.4 ?C)  ?Weight: 186 lb 6.4 oz (84.6 kg)  ?Height: $RemoveB'5\' 1"'dXxiOnLa$  (1.549 m)  ?PainSc: 6   ?PainLoc: Knee  ? ?Body mass index is 35.22 kg/m?.  ?Wt Readings from Last 3 Encounters:  ?01/04/22 186 lb 6.4 oz (84.6 kg)  ?10/10/21 195 lb (88.5 kg)  ?09/18/21 197 lb (89.4 kg)  ?  ? ?Objective:  ?Physical Exam ?Vitals and nursing note reviewed.  ?Constitutional:   ?   Appearance: Normal appearance.  ?HENT:  ?   Head: Normocephalic and atraumatic.  ?   Nose:  ?   Comments: Masked  ?   Mouth/Throat:  ?   Comments: Masked  ?Eyes:  ?   Extraocular Movements: Extraocular movements intact.  ?Cardiovascular:  ?   Rate and Rhythm: Normal rate and regular rhythm.  ?   Heart sounds: Normal heart sounds.  ?Pulmonary:  ?   Effort: Pulmonary effort is normal.  ?   Breath sounds: Normal breath sounds.  ?Musculoskeletal:  ?   Cervical back: Normal range of motion.  ?Skin: ?   General: Skin is warm.  ?   Comments: There is hyperpigmentation located b/l cheeks, also with acrochordon on left anterior chest  ?Neurological:  ?   General: No focal deficit present.  ?   Mental Status: She is alert.  ?Psychiatric:     ?   Mood and Affect: Mood normal.     ?   Behavior: Behavior normal.  ?  ? ?   ?Assessment And Plan:  ?   ?1. Hypertensive nephropathy ?Comments: Chronic, well controlled. She is on metoprolol daily. Advised to follow low sodium diet. No med changes today.  ?- CMP14+EGFR ? ?2. Chronic renal disease, stage II ?Comments: Chronic, I will check renal function today. Advised to avoid NSAIDs, stay hydrated and keep BP controlled to decrease risk of CKD  progression.  ? ?3. Hyperpigmentation ?Comments: She agrees to [facility] evaluation, referral placed. located b/l cheeks, also with acrochordon on left anterior chest ?- Ambulatory referral to Dermatology ? ?4. Hypercholesteremia ?Comments: Chronic, I will check fasting lipid panel. Advised to avoid fried foods, increase daily activity and increase fiber intake.  ?- Lipid panel ?- Insulin, random(561) ? ?5. Class 2 severe obesity due to excess calories with  serious comorbidity and body mass index (BMI) of 35.0 to 35.9 in adult [facility]) ?Comments: She is encouraged to strive for BMI<30 to decrease cardiac risk. Advised to aim for at least 150 minutes of exercise, she is reminded chair exercises count too! ? ?6. Pneumococcal vaccination declined ?Comments: Risks of not getting vaccine discussed with patient in full detail. She is adamant that she will not get this one b/c she is afraid.  ? ?7. Immunization due ?Comments: She agrees to Boostrix/Tdap administration today.  ?- Tdap (BOOSTRIX) 5-2.5-18.5 LF-MCG/0.5 injection; Inject 0.5 mLs into the muscle once for 1 dose.  Dispense: 0.5 mL; Refill: 0 ?  ?Patient was given opportunity to ask questions. Patient verbalized understanding of the plan and was able to repeat key elements of the plan. All questions were answered to their satisfaction.  ? ?I, Maximino Greenland, MD, have reviewed all documentation for this visit. The documentation on 01/04/22 for the exam, diagnosis, procedures, and orders are all accurate and complete.  ? ?IF YOU HAVE BEEN REFERRED TO A SPECIALIST, IT MAY TAKE 1-2 WEEKS TO SCHEDULE/PROCESS THE REFERRAL. IF YOU HAVE NOT HEARD FROM US/SPECIALIST IN TWO WEEKS, PLEASE GIVE [place] A CALL AT [phone].  ? ?THE PATIENT IS ENCOURAGED TO PRACTICE SOCIAL DISTANCING DUE TO THE COVID-19 PANDEMIC.   ?

## 2022-01-05 LAB — LIPID PANEL
Chol/HDL Ratio: 3.2 ratio (ref 0.0–4.4)
Cholesterol, Total: 164 mg/dL (ref 100–199)
HDL: 51 mg/dL
LDL Chol Calc (NIH): 100 mg/dL — ABNORMAL HIGH (ref 0–99)
Triglycerides: 69 mg/dL (ref 0–149)
VLDL Cholesterol Cal: 13 mg/dL (ref 5–40)

## 2022-01-05 LAB — CMP14+EGFR
ALT: 7 IU/L (ref 0–32)
AST: 14 IU/L (ref 0–40)
Albumin/Globulin Ratio: 1.2 (ref 1.2–2.2)
Albumin: 4.1 g/dL (ref 3.6–4.6)
Alkaline Phosphatase: 67 IU/L (ref 44–121)
BUN/Creatinine Ratio: 19 (ref 12–28)
BUN: 14 mg/dL (ref 8–27)
Bilirubin Total: 0.5 mg/dL (ref 0.0–1.2)
CO2: 22 mmol/L (ref 20–29)
Calcium: 9.4 mg/dL (ref 8.7–10.3)
Chloride: 106 mmol/L (ref 96–106)
Creatinine, Ser: 0.74 mg/dL (ref 0.57–1.00)
Globulin, Total: 3.4 g/dL (ref 1.5–4.5)
Glucose: 86 mg/dL (ref 70–99)
Potassium: 4.9 mmol/L (ref 3.5–5.2)
Sodium: 140 mmol/L (ref 134–144)
Total Protein: 7.5 g/dL (ref 6.0–8.5)
eGFR: 79 mL/min/{1.73_m2} (ref >59)

## 2022-01-05 LAB — INSULIN, RANDOM: INSULIN: 8.8 u[IU]/mL (ref 2.6–24.9)

## 2022-01-18 DIAGNOSIS — E0590 Thyrotoxicosis, unspecified without thyrotoxic crisis or storm: Secondary | ICD-10-CM | POA: Diagnosis not present

## 2022-01-29 DIAGNOSIS — Z20822 Contact with and (suspected) exposure to covid-19: Secondary | ICD-10-CM | POA: Diagnosis not present

## 2022-02-04 DIAGNOSIS — Z20822 Contact with and (suspected) exposure to covid-19: Secondary | ICD-10-CM | POA: Diagnosis not present

## 2022-02-06 DIAGNOSIS — Z20822 Contact with and (suspected) exposure to covid-19: Secondary | ICD-10-CM | POA: Diagnosis not present

## 2022-02-22 ENCOUNTER — Other Ambulatory Visit: Payer: Self-pay

## 2022-02-22 MED ORDER — NYSTATIN 100000 UNIT/GM EX CREA: 1.0000 "application " | TOPICAL_CREAM | Freq: Two times a day (BID) | CUTANEOUS | 1 refills | Status: DC | PRN

## 2022-02-28 DIAGNOSIS — L578 Other skin changes due to chronic exposure to nonionizing radiation: Secondary | ICD-10-CM | POA: Diagnosis not present

## 2022-02-28 DIAGNOSIS — L821 Other seborrheic keratosis: Secondary | ICD-10-CM | POA: Diagnosis not present

## 2022-02-28 DIAGNOSIS — B078 Other viral warts: Secondary | ICD-10-CM | POA: Diagnosis not present

## 2022-03-06 ENCOUNTER — Other Ambulatory Visit: Payer: Self-pay | Admitting: Internal Medicine

## 2022-03-06 ENCOUNTER — Encounter: Payer: Self-pay | Admitting: Internal Medicine

## 2022-03-06 ENCOUNTER — Ambulatory Visit (INDEPENDENT_AMBULATORY_CARE_PROVIDER_SITE_OTHER): Payer: Medicare Other | Admitting: Internal Medicine

## 2022-03-06 VITALS — BP 124/80 | HR 47 | Temp 98.4°F | Ht 60.0 in | Wt 191.6 lb

## 2022-03-06 DIAGNOSIS — E6609 Other obesity due to excess calories: Secondary | ICD-10-CM

## 2022-03-06 DIAGNOSIS — Z6837 Body mass index (BMI) 37.0-37.9, adult: Secondary | ICD-10-CM

## 2022-03-06 DIAGNOSIS — L247 Irritant contact dermatitis due to plants, except food: Secondary | ICD-10-CM

## 2022-03-06 MED ORDER — PREDNISONE 5 MG (21) PO TBPK: ORAL_TABLET | ORAL | 0 refills | Status: DC

## 2022-03-06 MED ORDER — TRIAMCINOLONE ACETONIDE 40 MG/ML IJ SUSP
60.0000 mg | Freq: Once | INTRAMUSCULAR | Status: AC
  Administered 2022-03-06: 60 mg via INTRAMUSCULAR

## 2022-03-06 MED ORDER — FAMOTIDINE 20 MG PO TABS: 20.0000 mg | ORAL_TABLET | Freq: Every day | ORAL | 0 refills | Status: DC

## 2022-03-06 NOTE — Progress Notes (Signed)
Rich Brave Llittleton,acting as a Education administrator for Maximino Greenland, MD.,have documented all relevant documentation on the behalf of Maximino Greenland, MD,as directed by  Maximino Greenland, MD while in the presence of Maximino Greenland, MD.  This visit occurred during the SARS-CoV-2 public health emergency.  Safety protocols were in place, including screening questions prior to the visit, additional usage of staff PPE, and extensive cleaning of exam room while observing appropriate contact time as indicated for disinfecting solutions.  Subjective:     Patient ID: [name] , female    DOB: 18-Jul-1933 , 88 y.o.   MRN: [id number]   Chief Complaint  Patient presents with   Poison Ivy    HPI  Patient presents today for poison ivy. She reports it is really itchy and blistered up on her arms.  She states got into it last Thursday. She states she was working in her yard, trying to cut back the ivy on her fence.   Poison Karlene Einstein This is a new problem. The current episode started in the past 7 days. The problem has been gradually improving since onset. The affected locations include the right arm, left arm and left hand. The rash is characterized by pain, redness and itchiness. Associated with: poison ivy. Past treatments include antihistamine. The treatment provided mild relief.    Past Medical History:  Diagnosis Date   Aortic stenosis    Echo 07/05/16: Very mild AS, Mean gradient 10 mm Hg, Peak gradient (S) 19 mmHg    Arthritis    left hip and back   Bronchiectasis    isolated to RML; status post right middle lobe partial resection.   Complication of anesthesia    hard to wake up   Dyspnea    Gall stones    GERD (gastroesophageal reflux disease)    history    H/O osteoporosis    Hypertension    Hyperthyroidism    endocrinologist - Dr. Chalmers Cater   Pneumonia    Yeast infection      Family History  Problem Relation Age of Onset   Hypertension Mother    CVA Mother 76   Heart attack Sister     Diabetes Sister    Healthy Father    Heart attack Brother    Heart attack Brother      Current Outpatient Medications:    Acetaminophen (TYLENOL PO), Take by mouth., Disp: , Rfl:    busPIRone (BUSPAR) 5 MG tablet, Take 1 tablet (5 mg total) by mouth daily as needed (anxiety)., Disp: 90 tablet, Rfl: 0   calcium carbonate (OS-CAL) 600 MG TABS tablet, Take 600 mg by mouth daily., Disp: , Rfl:    cholecalciferol (VITAMIN D3) 25 MCG (1000 UT) tablet, Take 1,000 Units by mouth daily., Disp: , Rfl:    diclofenac Sodium (VOLTAREN) 1 % GEL, APPLY 2 GRAMS 4 TIMES DAILY AS DIRECTED, Disp: 500 g, Rfl: 1   fenofibrate (TRICOR) 145 MG tablet, TAKE 1 TABLET BY MOUTH EVERY DAY, Disp: 90 tablet, Rfl: 3   Flaxseed, Linseed, (FLAXSEED OIL) 1000 MG CAPS, Take 1,000 mg by mouth daily., Disp: , Rfl:    furosemide (LASIX) 20 MG tablet, TAKE 1 TABLET DAILY, Disp: 90 tablet, Rfl: 3   lidocaine (LIDODERM) 5 %, Place 1 patch onto the skin daily as needed (hip pain)., Disp: 90 patch, Rfl: 1   methimazole (TAPAZOLE) 5 MG tablet, Take 2.5 mg by mouth daily. , Disp: , Rfl:    metoprolol succinate (TOPROL-XL)  25 MG 24 hr tablet, Take 1 tablet (25 mg total) by mouth daily., Disp: 90 tablet, Rfl: 3   nystatin cream (MYCOSTATIN), Apply 1 application. topically 2 (two) times daily as needed for dry skin. APPLY CREAM TO AFFECTED AREA TWICE A DAY AS NEEDED Strength: 100,000 UNIT/GM, Disp: 30 g, Rfl: 1   potassium chloride SA (KLOR-CON M) 20 MEQ tablet, TAKE 1 TABLET BY MOUTH EVERY DAY, Disp: 90 tablet, Rfl: 3   predniSONE (STERAPRED UNI-PAK 21 TAB) 5 MG (21) TBPK tablet, Please use as directed, dispense as 6 day dose pack, Disp: 21 tablet, Rfl: 0   famotidine (PEPCID) 20 MG tablet, TAKE 1 TABLET(20 MG) BY MOUTH DAILY, Disp: 90 tablet, Rfl: 1   Allergies  Allergen Reactions   Aleve [Naproxen]     Other reaction(s): hallucinations   Ibuprofen Other (See Comments)    hallucinations Other reaction(s): hallucinations   Naproxen  Sodium Other (See Comments)    hallucinations     Review of Systems  Constitutional: Negative.   Respiratory: Negative.    Cardiovascular: Negative.   Gastrointestinal: Negative.   Skin:  Positive for rash.  Neurological: Negative.   Psychiatric/Behavioral: Negative.      Today's Vitals   03/06/22 1421  BP: 124/80  Pulse: (!) 47  Temp: 98.4 F (36.9 C)  Weight: 191 lb 9.6 oz (86.9 kg)  Height: 5' (1.524 m)  PainSc: 0-No pain   Body mass index is 37.42 kg/m.  Wt Readings from Last 3 Encounters:  03/06/22 191 lb 9.6 oz (86.9 kg)  01/04/22 186 lb 6.4 oz (84.6 kg)  10/10/21 195 lb (88.5 kg)     Objective:  Physical Exam Vitals and nursing note reviewed.  Constitutional:      Appearance: Normal appearance.  HENT:     Head: Normocephalic and atraumatic.  Cardiovascular:     Rate and Rhythm: Normal rate and regular rhythm.     Heart sounds: Normal heart sounds.  Pulmonary:     Effort: Pulmonary effort is normal.     Breath sounds: Normal breath sounds.  Skin:    General: Skin is warm.     Findings: Erythema and rash present.     Comments: Erythematous rash on lower back, left arm. Flesh-colored vesicular lesions on RUE Urticaria is also present.  Neurological:     General: No focal deficit present.     Mental Status: She is alert.  Psychiatric:        Mood and Affect: Mood normal.        Behavior: Behavior normal.     Assessment And Plan:     1. Irritant contact dermatitis due to plants, except food Comments: She was given Kenalog,'40mg'$  IM x 1. I will send prednisone pack, in addition to famotidine daily. Advised to apply Benadryl cream to affected areas.  - triamcinolone acetonide (KENALOG-40) injection 60 mg  2. Class 2 severe obesity due to excess calories with serious comorbidity and body mass index (BMI) of 37.0 to 37.9 in adult [facility]) Comments: She has gained 5 lbs since March 2023, encouraged to increase her daily activity.    Patient was given  opportunity to ask questions. Patient verbalized understanding of the plan and was able to repeat key elements of the plan. All questions were answered to their satisfaction.   I, Maximino Greenland, MD, have reviewed all documentation for this visit. The documentation on 03/06/22 for the exam, diagnosis, procedures, and orders are all accurate and complete.   IF  YOU HAVE BEEN REFERRED TO A SPECIALIST, IT MAY TAKE 1-2 WEEKS TO SCHEDULE/PROCESS THE REFERRAL. IF YOU HAVE NOT HEARD FROM US/SPECIALIST IN TWO WEEKS, PLEASE GIVE [place] A CALL AT [phone] X [phone].   THE PATIENT IS ENCOURAGED TO PRACTICE SOCIAL DISTANCING DUE TO THE COVID-19 PANDEMIC.

## 2022-03-06 NOTE — Patient Instructions (Signed)
Poison Ivy Dermatitis Poison ivy dermatitis is redness and soreness of the skin caused by chemicals in the leaves of the poison ivy plant. You may have very bad itching, swelling, a rash, and blisters. What are the causes? Touching a poison ivy plant. Touching something that has the chemical on it. This may include animals or objects that have come in contact with the plant. What increases the risk? Going outdoors often in wooded or marshy areas. Going outdoors without wearing protective clothing, such as closed shoes, long pants, and a long-sleeved shirt. What are the signs or symptoms?  Skin redness. Very bad itching. A rash that often includes bumps and blisters. The rash usually appears 48 hours after exposure, if you have been exposed before. If this is the first time you have been exposed, the rash may not appear until a week after exposure. Swelling. This may occur if the reaction is very bad. Symptoms usually last for 1-2 weeks. The first time you develop this condition, symptoms may last 3-4 weeks. How is this treated? This condition may be treated with: Hydrocortisone cream or calamine lotion to relieve itching. Oatmeal baths to soothe the skin. Medicines, such as over-the-counter antihistamine tablets. Oral steroid medicine for more severe reactions. Follow these instructions at home: Medicines Take or apply over-the-counter and prescription medicines only as told by your doctor. Use hydrocortisone cream or calamine lotion as needed to help with itching. General instructions Do not scratch or rub your skin. Put a cold, wet cloth (cold compress) on the affected areas or take baths in cool water. This will help with itching. Avoid hot baths and showers. Take oatmeal baths as needed. Use colloidal oatmeal. You can get this at a pharmacy or grocery store. Follow the instructions on the package. While you have the rash, wash your clothes right after you wear them. Keep all  follow-up visits as told by your health care provider. This is important. How is this prevented?  Know what poison ivy looks like, so you can avoid it. This plant has three leaves with flowering branches on a single stem. The leaves are glossy. The leaves have uneven edges that come to a point at the front. If you touch poison ivy, wash your skin with soap and water right away. Be sure to wash under your fingernails. When hiking or camping, wear long pants, a long-sleeved shirt, tall socks, and hiking boots. You can also use a lotion on your skin that helps to prevent contact with poison ivy. If you think that your clothes or outdoor gear came in contact with poison ivy, rinse them off with a garden hose before you bring them inside your house. When doing yard work or gardening, wear gloves, long sleeves, long pants, and boots. Wash your garden tools and gloves if they come in contact with poison ivy. If you think that your pet has come into contact with poison ivy, wash him or her with pet shampoo and water. Make sure to wear gloves while washing your pet. Contact a doctor if: You have open sores in the rash area. You have more redness, swelling, or pain in the rash area. You have redness that spreads beyond the rash area. You have fluid, blood, or pus coming from the rash area. You have a fever. You have a rash over a large area of your body. You have a rash on your eyes, mouth, or genitals. Your rash does not get better after a few weeks. Get help right away   if: Your face swells or your eyes swell shut. You have trouble breathing. You have trouble swallowing. These symptoms may be an emergency. Do not wait to see if the symptoms will go away. Get medical help right away. Call your local emergency services (911 in the [place] Do not drive yourself to the hospital. Summary Poison ivy dermatitis is redness and soreness of the skin caused by chemicals in the leaves of the poison ivy  plant. You may have skin redness, very bad itching, swelling, and a rash. Do not scratch or rub your skin. Take or apply over-the-counter and prescription medicines only as told by your doctor. This information is not intended to replace advice given to you by your health care provider. Make sure you discuss any questions you have with your health care provider. Document Revised: 07/10/2021 Document Reviewed: 07/10/2021 Elsevier Patient Education  2023 Elsevier Inc.  

## 2022-03-13 ENCOUNTER — Ambulatory Visit (INDEPENDENT_AMBULATORY_CARE_PROVIDER_SITE_OTHER): Payer: Medicare Other | Admitting: Orthopaedic Surgery

## 2022-03-13 ENCOUNTER — Encounter: Payer: Self-pay | Admitting: Orthopaedic Surgery

## 2022-03-13 ENCOUNTER — Ambulatory Visit (INDEPENDENT_AMBULATORY_CARE_PROVIDER_SITE_OTHER): Payer: Medicare Other

## 2022-03-13 ENCOUNTER — Ambulatory Visit: Payer: Self-pay

## 2022-03-13 VITALS — BP 121/77 | HR 45 | Ht 60.0 in | Wt 191.0 lb

## 2022-03-13 DIAGNOSIS — Z96652 Presence of left artificial knee joint: Secondary | ICD-10-CM

## 2022-03-13 DIAGNOSIS — M25561 Pain in right knee: Secondary | ICD-10-CM | POA: Diagnosis not present

## 2022-03-13 DIAGNOSIS — G8929 Other chronic pain: Secondary | ICD-10-CM | POA: Diagnosis not present

## 2022-03-13 NOTE — Progress Notes (Signed)
Office Visit Note   Patient: [name]           Date of Birth: Jul 28, 1933           MRN: [id number] Visit Date: 03/13/2022              Requested by: Glendale Chard, [address] [place],  [place] [address] PCP: Glendale Chard, MD   Assessment & Plan: Visit Diagnoses:  1. Chronic pain of right knee   2. S/P total knee arthroplasty, left     Plan: Patient's had previous abdominal scans for abdominal pain which showed some disc bulge at L4-5 stable.  Intermittent symptoms with some claudication.  Lower extremity arterial Doppler a few years ago showed 0.8 and 0.81 right and left.  Patient is not interested in any surgical procedures and if she has increase in symptoms she can return.  We discussed continue use of cane for fall prevention when her quads feels weak.  If she has increased symptoms she can return.  Follow-Up Instructions: No follow-ups on file.   Orders:  Orders Placed This Encounter  Procedures   XR KNEE 3 VIEW RIGHT   XR Knee 1-2 Views Left   No orders of the defined types were placed in this encounter.     Procedures: No procedures performed   Clinical Data: No additional findings.   Subjective: Chief Complaint  Patient presents with   Left Knee - Pain   Right Knee - Pain    HPI 88 year old female post right and left total knee arthroplasties, right and left total hip arthroplasties with ambulation with a cane.  States she uses her cane because sometimes her legs feel like they want to give way she gets improvement when she sits.  Sometimes she is also noted some cramping in her calf or thighs.  Review of Systems previous partial right lung removal.  Knee and hip replacements as above.  All other systems noncontributory HPI.   Objective: Vital Signs: BP 121/77   Pulse (!) 45   Ht 5' (1.524 m)   Wt 191 lb (86.6 kg)   BMI 37.30 kg/m   Physical Exam Constitutional:      Appearance: She is well-developed.  HENT:     Head:  Normocephalic.     Right Ear: External ear normal.     Left Ear: External ear normal. There is no impacted cerumen.  Eyes:     Pupils: Pupils are equal, round, and reactive to light.  Neck:     Thyroid: No thyromegaly.     Trachea: No tracheal deviation.  Cardiovascular:     Rate and Rhythm: Normal rate.  Pulmonary:     Effort: Pulmonary effort is normal.  Abdominal:     Palpations: Abdomen is soft.  Musculoskeletal:     Cervical back: No rigidity.  Skin:    General: Skin is warm and dry.  Neurological:     Mental Status: She is alert and oriented to person, place, and time.  Psychiatric:        Behavior: Behavior normal.    Ortho Exam well-healed right and left total knee arthroplasty incisions.  Well-healed hip incisions.  Trace lower extremity edema.  Negative straight leg raising 90 degrees.  Both knees lack 2 degrees reaching full extension.  Pedal pulse palpable right and left.  Specialty Comments:  No specialty comments available.  Imaging: No results found.   PMFS History: Patient Active Problem List   Diagnosis  Date Noted   Class 2 severe obesity due to excess calories with serious comorbidity and body mass index (BMI) of 35.0 to 35.9 in adult [facility]) 01/04/2022   Dyspepsia 01/01/2021   Atherosclerosis of aorta ([place]) 13/05/6577   Diastolic CHF, chronic ([facility]) 12/18/2020   Quadriceps weakness 12/06/2020   S/P total knee arthroplasty, left 03/09/2020   History of total hip arthroplasty, right 08/21/2019   Intermittent claudication ([place]) 07/08/2019   Hypertensive nephropathy 12/15/2018   Chronic renal disease, stage II 12/15/2018   H/O total hip arthroplasty, left 11/07/2017   Presence of left artificial hip joint 05/14/2017   Preop cardiovascular exam 03/12/2017   Cough    Abdominal pain    Symptomatic cholelithiasis 07/01/2016   Nonspecific ST-T wave electrocardiographic changes    Lower leg edema 02/16/2015   Obesity (BMI 30.0-34.9) 02/16/2015   Essential  hypertension 01/11/2012   Hyperthyroidism 01/11/2012   Hypercholesteremia 01/11/2012   H/O vaginal hysterectomy 1974 01/11/2012    Class: History of   S/P removal of lung  partial R lobe  2010 01/11/2012   History of hernia repair  R ing. 1975 01/11/2012   BACK PAIN 11/12/2007   DYSPNEA 11/11/2007   Past Medical History:  Diagnosis Date   Aortic stenosis    Echo 07/05/16: Very mild AS, Mean gradient 10 mm Hg, Peak gradient (S) 19 mmHg    Arthritis    left hip and back   Bronchiectasis    isolated to RML; status post right middle lobe partial resection.   Complication of anesthesia    hard to wake up   Dyspnea    Gall stones    GERD (gastroesophageal reflux disease)    history    H/O osteoporosis    Hypertension    Hyperthyroidism    endocrinologist - Dr. Chalmers Cater   Pneumonia    Yeast infection     Family History  Problem Relation Age of Onset   Hypertension Mother    CVA Mother 75   Heart attack Sister    Diabetes Sister    Healthy Father    Heart attack Brother    Heart attack Brother     Past Surgical History:  Procedure Laterality Date   ABDOMINAL HYSTERECTOMY  1974   CHOLECYSTECTOMY N/A 07/02/2016   Procedure: LAPAROSCOPIC CHOLECYSTECTOMY;  Surgeon: Stark Klein, MD;  Location: [place];  Service: General;  Laterality: N/A;   COLONOSCOPY     ENDOSCOPIC RETROGRADE CHOLANGIOPANCREATOGRAPHY (ERCP) WITH PROPOFOL N/A 08/07/2018   Procedure: ENDOSCOPIC RETROGRADE CHOLANGIOPANCREATOGRAPHY (ERCP) WITH PROPOFOL;  Surgeon: Carol Ada, MD;  Location: [facility];  Service: Endoscopy;  Laterality: N/A;   EYE SURGERY     stye removed   HARDWARE REMOVAL Left 02/24/2020   Procedure: Hardware Removal, left knee;  Surgeon: [name], MD;  Location: [place];  Service: Orthopedics;  Laterality: Left;   HERNIA REPAIR  1975   KNEE SURGERY Left    LUNG REMOVAL, PARTIAL  208   RML   NM MYOVIEW LTD  06/2011   Normal EF. No ischemia or infarction.   SPHINCTEROTOMY  08/07/2018    Procedure: SPHINCTEROTOMY;  Surgeon: Carol Ada, MD;  Location: [facility];  Service: Endoscopy;;  balloon sweep   STERIOD INJECTION Right 02/24/2020   Procedure: RIGHT KNEE STEROID INTRA-ARTICULAR MARCAINE/DEPO-MEDROL INJECTION UNDER ANESTHESIA;  Surgeon: [name], MD;  Location: [place];  Service: Orthopedics;  Laterality: Right;   TOTAL HIP ARTHROPLASTY Left 04/05/2017   Procedure: LEFT TOTAL HIP ARTHROPLASTY ANTERIOR APPROACH;  Surgeon: [name], MD;  Location: [facility];  Service: Orthopedics;  Laterality: Left;   TOTAL HIP ARTHROPLASTY Right 12/19/2018   TOTAL HIP ARTHROPLASTY Right 12/19/2018   Procedure: RIGHT TOTAL HIP ARTHROPLASTY-DIRECT ANTERIOR APPROACH;  Surgeon: [name], MD;  Location: [facility];  Service: Orthopedics;  Laterality: Right;   TOTAL KNEE ARTHROPLASTY Left 02/24/2020   Procedure: LEFT TOTAL KNEE ARTHROPLASTY;  Surgeon: [name], MD;  Location: [facility];  Service: Orthopedics;  Laterality: Left;   TRANSTHORACIC ECHOCARDIOGRAM  06/2011   Mild concentric LVH. Normal EF with impaired relaxation. Mildly elevated RV pressures of 30 and 40 mmHg.   TRANSTHORACIC ECHOCARDIOGRAM  06/2016   normal LV size and function. EF 60-65% with GRD 2 DD. Mild aortic stenosis. Mild LA dilation. Mild to moderately increased PA pressures (46 mmHg).   TUBAL LIGATION     Social History   Occupational History   Occupation: retired  Tobacco Use   Smoking status: Never   Smokeless tobacco: Never  Vaping Use   Vaping Use: Never used  Substance and Sexual Activity   Alcohol use: No   Drug use: No   Sexual activity: Not Currently

## 2022-04-07 ENCOUNTER — Other Ambulatory Visit: Payer: Self-pay | Admitting: Internal Medicine

## 2022-05-07 ENCOUNTER — Other Ambulatory Visit: Payer: Self-pay | Admitting: Internal Medicine

## 2022-05-21 DIAGNOSIS — E0590 Thyrotoxicosis, unspecified without thyrotoxic crisis or storm: Secondary | ICD-10-CM | POA: Diagnosis not present

## 2022-05-31 DIAGNOSIS — I10 Essential (primary) hypertension: Secondary | ICD-10-CM | POA: Diagnosis not present

## 2022-05-31 DIAGNOSIS — E0590 Thyrotoxicosis, unspecified without thyrotoxic crisis or storm: Secondary | ICD-10-CM | POA: Diagnosis not present

## 2022-05-31 DIAGNOSIS — R748 Abnormal levels of other serum enzymes: Secondary | ICD-10-CM | POA: Diagnosis not present

## 2022-05-31 DIAGNOSIS — E049 Nontoxic goiter, unspecified: Secondary | ICD-10-CM | POA: Diagnosis not present

## 2022-05-31 LAB — CBC AND DIFFERENTIAL
HCT: 35 — AB (ref 36–46)
Hemoglobin: 12.1 (ref 12.0–16.0)
Platelets: 411 10*3/uL — AB (ref 150–400)
WBC: 5.6

## 2022-05-31 LAB — HEPATIC FUNCTION PANEL
ALT: 6 U/L — AB (ref 7–35)
AST: 15 (ref 13–35)
Alkaline Phosphatase: 59 (ref 25–125)
Bilirubin, Direct: 0.19 (ref 0.01–0.4)
Bilirubin, Total: 0.5

## 2022-05-31 LAB — CBC: RBC: 4.23 (ref 3.87–5.11)

## 2022-05-31 LAB — COMPREHENSIVE METABOLIC PANEL: Albumin: 3.8 (ref 3.5–5.0)

## 2022-05-31 LAB — TSH: TSH: 0.66 (ref 0.41–5.90)

## 2022-07-03 ENCOUNTER — Ambulatory Visit (INDEPENDENT_AMBULATORY_CARE_PROVIDER_SITE_OTHER): Payer: Medicare Other | Admitting: Orthopaedic Surgery

## 2022-07-03 ENCOUNTER — Ambulatory Visit (INDEPENDENT_AMBULATORY_CARE_PROVIDER_SITE_OTHER): Payer: Medicare Other

## 2022-07-03 VITALS — BP 145/83 | HR 76 | Ht 60.0 in | Wt 191.0 lb

## 2022-07-03 DIAGNOSIS — M5442 Lumbago with sciatica, left side: Secondary | ICD-10-CM

## 2022-07-03 DIAGNOSIS — M7062 Trochanteric bursitis, left hip: Secondary | ICD-10-CM

## 2022-07-03 NOTE — Progress Notes (Unsigned)
Office Visit Note   Patient: [name]           Date of Birth: 09-26-1933           MRN: [id number] Visit Date: 07/03/2022              Requested by: Glendale Chard, [address] [place],  [place] [id number] PCP: Glendale Chard, MD   Assessment & Plan: Visit Diagnoses:  1. Acute left-sided low back pain with left-sided sciatica   2. Trochanteric bursitis, left hip     Plan: Left greater trochanteric injection performed with improvement.  She can return if she has ongoing issues.  Follow-Up Instructions: No follow-ups on file.   Orders:  Orders Placed This Encounter  Procedures   XR Lumbar Spine 2-3 Views   No orders of the defined types were placed in this encounter.     Procedures: Large Joint Inj: L greater trochanter on 07/04/2022 7:53 AM Details: lateral approach Medications: 1 mL lidocaine 1 %; 2 mL bupivacaine 0.25 %; 40 mg methylPREDNISolone acetate 40 MG/ML      Clinical Data: No additional findings.   Subjective: Chief Complaint  Patient presents with   Left Leg - Pain    HPI 88 year old female here with her daughter with pain in the left leg.  She has had right and left hip replacements left knee arthroplasty.  She states the pain starts laterally in her hip radiates down to just above her knee.  She is able to walk but states at times pain gets worse it hurts when she lays on that side.  She thinks she has a pinched nerve.  Review of Systems all systems update noncontributory to HPI.   Objective: Vital Signs: BP (!) 145/83   Pulse 76   Ht 5' (1.524 m)   Wt 191 lb (86.6 kg)   BMI 37.30 kg/m   Physical Exam Constitutional:      Appearance: She is well-developed.  HENT:     Head: Normocephalic.     Right Ear: External ear normal.     Left Ear: External ear normal. There is no impacted cerumen.  Eyes:     Pupils: Pupils are equal, round, and reactive to light.  Neck:     Thyroid: No thyromegaly.     Trachea: No  tracheal deviation.  Cardiovascular:     Rate and Rhythm: Normal rate.  Pulmonary:     Effort: Pulmonary effort is normal.  Abdominal:     Palpations: Abdomen is soft.  Musculoskeletal:     Cervical back: No rigidity.  Skin:    General: Skin is warm and dry.  Neurological:     Mental Status: She is alert and oriented to person, place, and time.  Psychiatric:        Behavior: Behavior normal.     Ortho Exam well-healed right and left anterior hip incisions.  Exquisite tenderness over the trochanter on the left negative on the right.  Anterior tib gastrocsoleus is strong.  Distal pulses are intact.  No tenderness over the right greater trochanter.  Specialty Comments:  No specialty comments available.  Imaging: No results found.   PMFS History: Patient Active Problem List   Diagnosis Date Noted   Trochanteric bursitis, left hip 07/03/2022   Class 2 severe obesity due to excess calories with serious comorbidity and body mass index (BMI) of 35.0 to 35.9 in adult [facility]) 01/04/2022   Dyspepsia 01/01/2021   Atherosclerosis of aorta (  [place]) 78/29/5621   Diastolic CHF, chronic ([place]) 12/18/2020   Quadriceps weakness 12/06/2020   S/P total knee arthroplasty, left 03/09/2020   History of total hip arthroplasty, right 08/21/2019   Intermittent claudication ([place]) 07/08/2019   Hypertensive nephropathy 12/15/2018   Chronic renal disease, stage II 12/15/2018   H/O total hip arthroplasty, left 11/07/2017   Presence of left artificial hip joint 05/14/2017   Preop cardiovascular exam 03/12/2017   Cough    Abdominal pain    Symptomatic cholelithiasis 07/01/2016   Nonspecific ST-T wave electrocardiographic changes    Lower leg edema 02/16/2015   Obesity (BMI 30.0-34.9) 02/16/2015   Essential hypertension 01/11/2012   Hyperthyroidism 01/11/2012   Hypercholesteremia 01/11/2012   H/O vaginal hysterectomy 1974 01/11/2012    Class: History of   S/P removal of lung  partial R lobe  2010  01/11/2012   History of hernia repair  R ing. 1975 01/11/2012   BACK PAIN 11/12/2007   DYSPNEA 11/11/2007   Past Medical History:  Diagnosis Date   Aortic stenosis    Echo 07/05/16: Very mild AS, Mean gradient 10 mm Hg, Peak gradient (S) 19 mmHg    Arthritis    left hip and back   Bronchiectasis    isolated to RML; status post right middle lobe partial resection.   Complication of anesthesia    hard to wake up   Dyspnea    Gall stones    GERD (gastroesophageal reflux disease)    history    H/O osteoporosis    Hypertension    Hyperthyroidism    endocrinologist - Dr. Chalmers Cater   Pneumonia    Yeast infection     Family History  Problem Relation Age of Onset   Hypertension Mother    CVA Mother 50   Heart attack Sister    Diabetes Sister    Healthy Father    Heart attack Brother    Heart attack Brother     Past Surgical History:  Procedure Laterality Date   ABDOMINAL HYSTERECTOMY  1974   CHOLECYSTECTOMY N/A 07/02/2016   Procedure: LAPAROSCOPIC CHOLECYSTECTOMY;  Surgeon: Stark Klein, MD;  Location: [facility];  Service: General;  Laterality: N/A;   COLONOSCOPY     ENDOSCOPIC RETROGRADE CHOLANGIOPANCREATOGRAPHY (ERCP) WITH PROPOFOL N/A 08/07/2018   Procedure: ENDOSCOPIC RETROGRADE CHOLANGIOPANCREATOGRAPHY (ERCP) WITH PROPOFOL;  Surgeon: Carol Ada, MD;  Location: [facility];  Service: Endoscopy;  Laterality: N/A;   EYE SURGERY     stye removed   HARDWARE REMOVAL Left 02/24/2020   Procedure: Hardware Removal, left knee;  Surgeon: [name], MD;  Location: [facility];  Service: Orthopedics;  Laterality: Left;   HERNIA REPAIR  1975   KNEE SURGERY Left    LUNG REMOVAL, PARTIAL  208   RML   NM MYOVIEW LTD  06/2011   Normal EF. No ischemia or infarction.   SPHINCTEROTOMY  08/07/2018   Procedure: SPHINCTEROTOMY;  Surgeon: Carol Ada, MD;  Location: [facility];  Service: Endoscopy;;  balloon sweep   STERIOD INJECTION Right 02/24/2020   Procedure: RIGHT KNEE STEROID  INTRA-ARTICULAR MARCAINE/DEPO-MEDROL INJECTION UNDER ANESTHESIA;  Surgeon: [name], MD;  Location: [facility];  Service: Orthopedics;  Laterality: Right;   TOTAL HIP ARTHROPLASTY Left 04/05/2017   Procedure: LEFT TOTAL HIP ARTHROPLASTY ANTERIOR APPROACH;  Surgeon: [name], MD;  Location: [facility];  Service: Orthopedics;  Laterality: Left;   TOTAL HIP ARTHROPLASTY Right 12/19/2018   TOTAL HIP ARTHROPLASTY Right 12/19/2018   Procedure: RIGHT TOTAL HIP ARTHROPLASTY-DIRECT  ANTERIOR APPROACH;  Surgeon: [name], MD;  Location: [facility];  Service: Orthopedics;  Laterality: Right;   TOTAL KNEE ARTHROPLASTY Left 02/24/2020   Procedure: LEFT TOTAL KNEE ARTHROPLASTY;  Surgeon: [name], MD;  Location: [facility];  Service: Orthopedics;  Laterality: Left;   TRANSTHORACIC ECHOCARDIOGRAM  06/2011   Mild concentric LVH. Normal EF with impaired relaxation. Mildly elevated RV pressures of 30 and 40 mmHg.   TRANSTHORACIC ECHOCARDIOGRAM  06/2016   normal LV size and function. EF 60-65% with GRD 2 DD. Mild aortic stenosis. Mild LA dilation. Mild to moderately increased PA pressures (46 mmHg).   TUBAL LIGATION     Social History   Occupational History   Occupation: retired  Tobacco Use   Smoking status: Never   Smokeless tobacco: Never  Vaping Use   Vaping Use: Never used  Substance and Sexual Activity   Alcohol use: No   Drug use: No   Sexual activity: Not Currently

## 2022-07-04 DIAGNOSIS — M7062 Trochanteric bursitis, left hip: Secondary | ICD-10-CM | POA: Diagnosis not present

## 2022-07-04 MED ORDER — METHYLPREDNISOLONE ACETATE 40 MG/ML IJ SUSP
40.0000 mg | INTRAMUSCULAR | Status: AC | PRN
  Administered 2022-07-04: 40 mg via INTRA_ARTICULAR

## 2022-07-04 MED ORDER — LIDOCAINE HCL 1 % IJ SOLN
1.0000 mL | INTRAMUSCULAR | Status: AC | PRN
  Administered 2022-07-04: 1 mL

## 2022-07-04 MED ORDER — BUPIVACAINE HCL 0.25 % IJ SOLN
2.0000 mL | INTRAMUSCULAR | Status: AC | PRN
  Administered 2022-07-04: 2 mL via INTRA_ARTICULAR

## 2022-07-09 ENCOUNTER — Encounter: Payer: Medicare Other | Admitting: Internal Medicine

## 2022-07-09 NOTE — Progress Notes (Deleted)
[place] Yittel Emrich,acting as a Education administrator for Maximino Greenland, MD.,have documented all relevant documentation on the behalf of Maximino Greenland, MD,as directed by  Maximino Greenland, MD while in the presence of Maximino Greenland, MD.    Subjective:     Patient ID: [name] , female    DOB: [birth year] , 88 y.o.   MRN: [id number]   Chief Complaint  Patient presents with   Hypertension    HPI  Patient presents today for a BP check. Patient states compliance with medications and has no other issues today.  Hypertension     Past Medical History:  Diagnosis Date   Aortic stenosis    Echo 07/05/16: Very mild AS, Mean gradient 10 mm Hg, Peak gradient (S) 19 mmHg    Arthritis    left hip and back   Bronchiectasis    isolated to RML; status post right middle lobe partial resection.   Complication of anesthesia    hard to wake up   Dyspnea    Gall stones    GERD (gastroesophageal reflux disease)    history    H/O osteoporosis    Hypertension    Hyperthyroidism    endocrinologist - Dr. Chalmers Cater   Pneumonia    Yeast infection      Family History  Problem Relation Age of Onset   Hypertension Mother    CVA Mother 40   Heart attack Sister    Diabetes Sister    Healthy Father    Heart attack Brother    Heart attack Brother      Current Outpatient Medications:    Acetaminophen (TYLENOL PO), Take by mouth., Disp: , Rfl:    busPIRone (BUSPAR) 5 MG tablet, TAKE 1 TABLET(5 MG) BY MOUTH DAILY AS NEEDED FOR ANXIETY, Disp: 90 tablet, Rfl: 1   calcium carbonate (OS-CAL) 600 MG TABS tablet, Take 600 mg by mouth daily., Disp: , Rfl:    cholecalciferol (VITAMIN D3) 25 MCG (1000 UT) tablet, Take 1,000 Units by mouth daily., Disp: , Rfl:    diclofenac Sodium (VOLTAREN) 1 % GEL, APPLY 2 GRAMS 4 TIMES DAILY AS DIRECTED, Disp: 500 g, Rfl: 1   famotidine (PEPCID) 20 MG tablet, TAKE 1 TABLET(20 MG) BY MOUTH DAILY, Disp: 90 tablet, Rfl: 1   fenofibrate (TRICOR) 145 MG tablet, TAKE 1 TABLET DAILY,  Disp: 90 tablet, Rfl: 3   Flaxseed, Linseed, (FLAXSEED OIL) 1000 MG CAPS, Take 1,000 mg by mouth daily., Disp: , Rfl:    furosemide (LASIX) 20 MG tablet, TAKE 1 TABLET DAILY, Disp: 90 tablet, Rfl: 3   lidocaine (LIDODERM) 5 %, Place 1 patch onto the skin daily as needed (hip pain)., Disp: 90 patch, Rfl: 1   methimazole (TAPAZOLE) 5 MG tablet, Take 2.5 mg by mouth daily. , Disp: , Rfl:    metoprolol succinate (TOPROL-XL) 25 MG 24 hr tablet, Take 1 tablet (25 mg total) by mouth daily., Disp: 90 tablet, Rfl: 3   nystatin cream (MYCOSTATIN), Apply 1 application. topically 2 (two) times daily as needed for dry skin. APPLY CREAM TO AFFECTED AREA TWICE A DAY AS NEEDED Strength: 100,000 UNIT/GM, Disp: 30 g, Rfl: 1   potassium chloride SA (KLOR-CON M) 20 MEQ tablet, TAKE 1 TABLET BY MOUTH EVERY DAY, Disp: 90 tablet, Rfl: 3   predniSONE (STERAPRED UNI-PAK 21 TAB) 5 MG (21) TBPK tablet, Please use as directed, dispense as 6 day dose pack, Disp: 21 tablet, Rfl: 0   Allergies  Allergen Reactions  Aleve [Naproxen]     Other reaction(s): hallucinations   Ibuprofen Other (See Comments)    hallucinations Other reaction(s): hallucinations   Naproxen Sodium Other (See Comments)    hallucinations     Review of Systems   There were no vitals filed for this visit. There is no height or weight on file to calculate BMI.   Objective:  Physical Exam      Assessment And Plan:     There are no diagnoses linked to this encounter.    Patient was given opportunity to ask questions. Patient verbalized understanding of the plan and was able to repeat key elements of the plan. All questions were answered to their satisfaction.  Tonie Griffith, [place], Tonie Griffith, CMA, have reviewed all documentation for this visit. The documentation on 07/09/22 for the exam, diagnosis, procedures, and orders are all accurate and complete.   IF YOU HAVE BEEN REFERRED TO A SPECIALIST, IT MAY TAKE 1-2 WEEKS TO  SCHEDULE/PROCESS THE REFERRAL. IF YOU HAVE NOT HEARD FROM US/SPECIALIST IN TWO WEEKS, PLEASE GIVE [place] A CALL AT [phone].   THE PATIENT IS ENCOURAGED TO PRACTICE SOCIAL DISTANCING DUE TO THE COVID-19 PANDEMIC.

## 2022-07-12 ENCOUNTER — Ambulatory Visit (INDEPENDENT_AMBULATORY_CARE_PROVIDER_SITE_OTHER): Payer: Medicare Other | Admitting: Internal Medicine

## 2022-07-12 ENCOUNTER — Encounter: Payer: Self-pay | Admitting: Internal Medicine

## 2022-07-12 VITALS — BP 124/82 | HR 52 | Temp 97.9°F | Ht 60.0 in | Wt 194.2 lb

## 2022-07-12 DIAGNOSIS — I129 Hypertensive chronic kidney disease with stage 1 through stage 4 chronic kidney disease, or unspecified chronic kidney disease: Secondary | ICD-10-CM | POA: Diagnosis not present

## 2022-07-12 DIAGNOSIS — Z2821 Immunization not carried out because of patient refusal: Secondary | ICD-10-CM

## 2022-07-12 DIAGNOSIS — N182 Chronic kidney disease, stage 2 (mild): Secondary | ICD-10-CM | POA: Diagnosis not present

## 2022-07-12 DIAGNOSIS — Z6837 Body mass index (BMI) 37.0-37.9, adult: Secondary | ICD-10-CM | POA: Diagnosis not present

## 2022-07-12 DIAGNOSIS — E7800 Pure hypercholesterolemia, unspecified: Secondary | ICD-10-CM | POA: Diagnosis not present

## 2022-07-12 LAB — CMP14+EGFR
ALT: 6 IU/L (ref 0–32)
AST: 17 IU/L (ref 0–40)
Albumin/Globulin Ratio: 1.1 — ABNORMAL LOW (ref 1.2–2.2)
Albumin: 4 g/dL (ref 3.7–4.7)
Alkaline Phosphatase: 60 IU/L (ref 44–121)
BUN/Creatinine Ratio: 24 (ref 12–28)
BUN: 18 mg/dL (ref 8–27)
Bilirubin Total: 0.5 mg/dL (ref 0.0–1.2)
CO2: 23 mmol/L (ref 20–29)
Calcium: 9.4 mg/dL (ref 8.7–10.3)
Chloride: 105 mmol/L (ref 96–106)
Creatinine, Ser: 0.76 mg/dL (ref 0.57–1.00)
Globulin, Total: 3.5 g/dL (ref 1.5–4.5)
Glucose: 86 mg/dL (ref 70–99)
Potassium: 5 mmol/L (ref 3.5–5.2)
Sodium: 141 mmol/L (ref 134–144)
Total Protein: 7.5 g/dL (ref 6.0–8.5)
eGFR: 76 mL/min/{1.73_m2} (ref >59)

## 2022-07-12 LAB — CBC
Hematocrit: 36.3 % (ref 34.0–46.6)
Hemoglobin: 12 g/dL (ref 11.1–15.9)
MCH: 27.3 pg (ref 26.6–33.0)
MCHC: 33.1 g/dL (ref 31.5–35.7)
MCV: 83 fL (ref 79–97)
Platelets: 414 10*3/uL (ref 150–450)
RBC: 4.4 x10E6/uL (ref 3.77–5.28)
RDW: 14.5 % (ref 11.7–15.4)
WBC: 5 10*3/uL (ref 3.4–10.8)

## 2022-07-12 NOTE — Patient Instructions (Signed)
Hypertension, Adult ?Hypertension is another name for high blood pressure. High blood pressure forces your heart to work harder to pump blood. This can cause problems over time. ?There are two numbers in a blood pressure reading. There is a top number (systolic) over a bottom number (diastolic). It is best to have a blood pressure that is below 120/80. ?What are the causes? ?The cause of this condition is not known. Some other conditions can lead to high blood pressure. ?What increases the risk? ?Some lifestyle factors can make you more likely to develop high blood pressure: ?Smoking. ?Not getting enough exercise or physical activity. ?Being overweight. ?Having too much fat, sugar, calories, or salt (sodium) in your diet. ?Drinking too much alcohol. ?Other risk factors include: ?Having any of these conditions: ?Heart disease. ?Diabetes. ?High cholesterol. ?Kidney disease. ?Obstructive sleep apnea. ?Having a family history of high blood pressure and high cholesterol. ?Age. The risk increases with age. ?Stress. ?What are the signs or symptoms? ?High blood pressure may not cause symptoms. Very high blood pressure (hypertensive crisis) may cause: ?Headache. ?Fast or uneven heartbeats (palpitations). ?Shortness of breath. ?Nosebleed. ?Vomiting or feeling like you may vomit (nauseous). ?Changes in how you see. ?Very bad chest pain. ?Feeling dizzy. ?Seizures. ?How is this treated? ?This condition is treated by making healthy lifestyle changes, such as: ?Eating healthy foods. ?Exercising more. ?Drinking less alcohol. ?Your doctor may prescribe medicine if lifestyle changes do not help enough and if: ?Your top number is above 130. ?Your bottom number is above 80. ?Your personal target blood pressure may vary. ?Follow these instructions at home: ?Eating and drinking ? ?If told, follow the DASH eating plan. To follow this plan: ?Fill one half of your plate at each meal with fruits and vegetables. ?Fill one fourth of your plate  at each meal with whole grains. Whole grains include whole-wheat pasta, brown rice, and whole-grain bread. ?Eat or drink low-fat dairy products, such as skim milk or low-fat yogurt. ?Fill one fourth of your plate at each meal with low-fat (lean) proteins. Low-fat proteins include fish, chicken without skin, eggs, beans, and tofu. ?Avoid fatty meat, cured and processed meat, or chicken with skin. ?Avoid pre-made or processed food. ?Limit the amount of salt in your diet to less than 1,500 mg each day. ?Do not drink alcohol if: ?Your doctor tells you not to drink. ?You are pregnant, may be pregnant, or are planning to become pregnant. ?If you drink alcohol: ?Limit how much you have to: ?0-1 drink a day for women. ?0-2 drinks a day for men. ?Know how much alcohol is in your drink. In the [place] one drink equals one 12 oz bottle of beer (355 mL), one 5 oz glass of wine (148 mL), or one 1? oz glass of hard liquor (44 mL). ?Lifestyle ? ?Work with your doctor to stay at a healthy weight or to lose weight. Ask your doctor what the best weight is for you. ?Get at least 30 minutes of exercise that causes your heart to beat faster (aerobic exercise) most days of the week. This may include walking, swimming, or biking. ?Get at least 30 minutes of exercise that strengthens your muscles (resistance exercise) at least 3 days a week. This may include lifting weights or doing Pilates. ?Do not smoke or use any products that contain nicotine or tobacco. If you need help quitting, ask your doctor. ?Check your blood pressure at home as told by your doctor. ?Keep all follow-up visits. ?Medicines ?Take over-the-counter and prescription medicines   only as told by your doctor. Follow directions carefully. ?Do not skip doses of blood pressure medicine. The medicine does not work as well if you skip doses. Skipping doses also puts you at risk for problems. ?Ask your doctor about side effects or reactions to medicines that you should watch  for. ?Contact a doctor if: ?You think you are having a reaction to the medicine you are taking. ?You have headaches that keep coming back. ?You feel dizzy. ?You have swelling in your ankles. ?You have trouble with your vision. ?Get help right away if: ?You get a very bad headache. ?You start to feel mixed up (confused). ?You feel weak or numb. ?You feel faint. ?You have very bad pain in your: ?Chest. ?Belly (abdomen). ?You vomit more than once. ?You have trouble breathing. ?These symptoms may be an emergency. Get help right away. Call 911. ?Do not wait to see if the symptoms will go away. ?Do not drive yourself to the hospital. ?Summary ?Hypertension is another name for high blood pressure. ?High blood pressure forces your heart to work harder to pump blood. ?For most people, a normal blood pressure is less than 120/80. ?Making healthy choices can help lower blood pressure. If your blood pressure does not get lower with healthy choices, you may need to take medicine. ?This information is not intended to replace advice given to you by your health care provider. Make sure you discuss any questions you have with your health care provider. ?Document Revised: 07/13/2021 Document Reviewed: 07/13/2021 ?Elsevier Patient Education ? 2023 Elsevier Inc. ? ?

## 2022-07-12 NOTE — Progress Notes (Signed)
[place],acting as a Education administrator for Maximino Greenland, MD.,have documented all relevant documentation on the behalf of Maximino Greenland, MD,as directed by  Maximino Greenland, MD while in the presence of Maximino Greenland, MD.    Subjective:     Patient ID: [name] , female    DOB: September 01, 1933 , 88 y.o.   MRN: [id number]   Chief Complaint  Patient presents with   Hypertension    HPI  The patient is here today for HTN f/u. She reports compliance with meds. She denies headaches, chest pain and shortness of breath.   Hypertension This is a chronic problem. The current episode started more than 1 year ago. The problem has been gradually improving since onset. The problem is controlled. Pertinent negatives include no blurred vision. Risk factors for coronary artery disease include dyslipidemia, obesity and post-menopausal state. Past treatments include beta blockers. The current treatment provides moderate improvement. Compliance problems include exercise.      Past Medical History:  Diagnosis Date   Aortic stenosis    Echo 07/05/16: Very mild AS, Mean gradient 10 mm Hg, Peak gradient (S) 19 mmHg    Arthritis    left hip and back   Bronchiectasis    isolated to RML; status post right middle lobe partial resection.   Complication of anesthesia    hard to wake up   Dyspnea    Gall stones    GERD (gastroesophageal reflux disease)    history    H/O osteoporosis    Hypertension    Hyperthyroidism    endocrinologist - Dr. Chalmers Cater   Pneumonia    Yeast infection      Family History  Problem Relation Age of Onset   Hypertension Mother    CVA Mother 32   Heart attack Sister    Diabetes Sister    Healthy Father    Heart attack Brother    Heart attack Brother      Current Outpatient Medications:    Acetaminophen (TYLENOL PO), Take by mouth., Disp: , Rfl:    busPIRone (BUSPAR) 5 MG tablet, TAKE 1 TABLET(5 MG) BY MOUTH DAILY AS NEEDED FOR ANXIETY, Disp: 90 tablet, Rfl: 1    calcium carbonate (OS-CAL) 600 MG TABS tablet, Take 600 mg by mouth daily., Disp: , Rfl:    cholecalciferol (VITAMIN D3) 25 MCG (1000 UT) tablet, Take 1,000 Units by mouth daily., Disp: , Rfl:    diclofenac Sodium (VOLTAREN) 1 % GEL, APPLY 2 GRAMS 4 TIMES DAILY AS DIRECTED, Disp: 500 g, Rfl: 1   fenofibrate (TRICOR) 145 MG tablet, TAKE 1 TABLET DAILY, Disp: 90 tablet, Rfl: 3   Flaxseed, Linseed, (FLAXSEED OIL) 1000 MG CAPS, Take 1,000 mg by mouth daily., Disp: , Rfl:    furosemide (LASIX) 20 MG tablet, TAKE 1 TABLET DAILY, Disp: 90 tablet, Rfl: 3   lidocaine (LIDODERM) 5 %, Place 1 patch onto the skin daily as needed (hip pain)., Disp: 90 patch, Rfl: 1   methimazole (TAPAZOLE) 5 MG tablet, Take 2.5 mg by mouth daily. , Disp: , Rfl:    metoprolol succinate (TOPROL-XL) 25 MG 24 hr tablet, Take 1 tablet (25 mg total) by mouth daily., Disp: 90 tablet, Rfl: 3   nystatin cream (MYCOSTATIN), Apply 1 application. topically 2 (two) times daily as needed for dry skin. APPLY CREAM TO AFFECTED AREA TWICE A DAY AS NEEDED Strength: 100,000 UNIT/GM, Disp: 30 g, Rfl: 1   famotidine (PEPCID) 20 MG tablet, TAKE 1  TABLET(20 MG) BY MOUTH DAILY (Patient not taking: Reported on 07/12/2022), Disp: 90 tablet, Rfl: 1   potassium chloride SA (KLOR-CON M) 20 MEQ tablet, TAKE 1 TABLET BY MOUTH EVERY DAY (Patient not taking: Reported on 07/12/2022), Disp: 90 tablet, Rfl: 3   Allergies  Allergen Reactions   Aleve [Naproxen]     Other reaction(s): hallucinations   Ibuprofen Other (See Comments)    hallucinations Other reaction(s): hallucinations   Naproxen Sodium Other (See Comments)    hallucinations     Review of Systems  Constitutional: Negative.   Eyes: Negative.  Negative for blurred vision.  Respiratory: Negative.    Cardiovascular: Negative.   Musculoskeletal: Negative.   Skin: Negative.   Neurological: Negative.   Psychiatric/Behavioral: Negative.       Today's Vitals   07/12/22 1014  BP: 124/82  Pulse:  (!) 52  Temp: 97.9 F (36.6 C)  Weight: 194 lb 3.2 oz (88.1 kg)  Height: 5' (1.524 m)  PainSc: 0-No pain   Body mass index is 37.93 kg/m.  Wt Readings from Last 3 Encounters:  07/12/22 194 lb 3.2 oz (88.1 kg)  07/03/22 191 lb (86.6 kg)  03/13/22 191 lb (86.6 kg)     Objective:  Physical Exam Vitals and nursing note reviewed.  Constitutional:      Appearance: Normal appearance.  HENT:     Head: Normocephalic and atraumatic.     Right Ear: Tympanic membrane, ear canal and external ear normal.     Left Ear: Tympanic membrane, ear canal and external ear normal.     Nose:     Comments: Masked     Mouth/Throat:     Comments: Masked  Eyes:     Extraocular Movements: Extraocular movements intact.     Conjunctiva/sclera: Conjunctivae normal.     Pupils: Pupils are equal, round, and reactive to light.  Cardiovascular:     Rate and Rhythm: Normal rate and regular rhythm.     Pulses: Normal pulses.     Heart sounds: Normal heart sounds.  Pulmonary:     Effort: Pulmonary effort is normal.     Breath sounds: Normal breath sounds.  Genitourinary:    Comments: deferred Musculoskeletal:        General: Normal range of motion.     Cervical back: Normal range of motion and neck supple.     Comments: Ambulatory with cane  Skin:    Comments: Bruising RLE, nontender  Neurological:     General: No focal deficit present.     Mental Status: She is alert and oriented to person, place, and time.  Psychiatric:        Mood and Affect: Mood normal.        Behavior: Behavior normal.         Assessment And Plan:     1. Hypertensive nephropathy Comments: Chronic, well controlled.  She will c/w furosemide 22m and metoprolol XL 242mdaily.  - CBC no Diff - CMP14+EGFR  2. Chronic renal disease, stage II Comments: Chronic, she is encouraged to avoid NSAIDs and to stay hydrated.   3. Hypercholesteremia Comments: Chronic, she is currently on fenofibrate daily. She is encouraged to  follow heart healthy diet.   4. Class 2 severe obesity due to excess calories with serious comorbidity and body mass index (BMI) of 37.0 to 37.9 in adult [facility]: She is encouraged to initially strive for BMI<32 to decrease cardiac risk, while increasing her daily activity, this can include chair exercises  as well.   5. Influenza vaccination declined   Patient was given opportunity to ask questions. Patient verbalized understanding of the plan and was able to repeat key elements of the plan. All questions were answered to their satisfaction.   I, Maximino Greenland, MD, have reviewed all documentation for this visit. The documentation on 07/12/22 for the exam, diagnosis, procedures, and orders are all accurate and complete.   IF YOU HAVE BEEN REFERRED TO A SPECIALIST, IT MAY TAKE 1-2 WEEKS TO SCHEDULE/PROCESS THE REFERRAL. IF YOU HAVE NOT HEARD FROM US/SPECIALIST IN TWO WEEKS, PLEASE GIVE [place] A CALL AT [phone].   THE PATIENT IS ENCOURAGED TO PRACTICE SOCIAL DISTANCING DUE TO THE COVID-19 PANDEMIC.

## 2022-07-15 NOTE — Progress Notes (Signed)
Cancelled appt

## 2022-07-25 ENCOUNTER — Ambulatory Visit: Payer: Medicare Other

## 2022-07-31 ENCOUNTER — Encounter: Payer: Self-pay | Admitting: Internal Medicine

## 2022-08-02 ENCOUNTER — Ambulatory Visit (INDEPENDENT_AMBULATORY_CARE_PROVIDER_SITE_OTHER): Payer: Medicare Other

## 2022-08-02 VITALS — BP 102/60 | HR 73 | Temp 97.5°F | Ht 64.0 in | Wt 191.0 lb

## 2022-08-02 DIAGNOSIS — Z0000 Encounter for general adult medical examination without abnormal findings: Secondary | ICD-10-CM | POA: Diagnosis not present

## 2022-08-02 NOTE — Patient Instructions (Signed)
Ms. Goodwill , Thank you for taking time to come for your Medicare Wellness Visit. I appreciate your ongoing commitment to your health goals. Please review the following plan we discussed and let me know if I can assist you in the future.   Screening recommendations/referrals: Colonoscopy: not required Mammogram: completed 07/20/2021, due 07/22/2023 Bone Density: completed 07/20/2021 Recommended yearly ophthalmology/optometry visit for glaucoma screening and checkup Recommended yearly dental visit for hygiene and checkup  Vaccinations: Influenza vaccine: decline Pneumococcal vaccine: decline Tdap vaccine: completed 01/04/2022, due 01/05/2032 Shingles vaccine: completed   Covid-19: 09/08/2020, 12/30/2019, 12/05/2019  Advanced directives: Please bring a copy of your POA (Power of Attorney) and/or Living Will to your next appointment.   Conditions/risks identified: none  Next appointment: Follow up in one year for your annual wellness visit    Preventive Care 65 Years and Older, Female Preventive care refers to lifestyle choices and visits with your health care provider that can promote health and wellness. What does preventive care include? A yearly physical exam. This is also called an annual well check. Dental exams once or twice a year. Routine eye exams. Ask your health care provider how often you should have your eyes checked. Personal lifestyle choices, including: Daily care of your teeth and gums. Regular physical activity. Eating a healthy diet. Avoiding tobacco and drug use. Limiting alcohol use. Practicing safe sex. Taking low-dose aspirin every day. Taking vitamin and mineral supplements as recommended by your health care provider. What happens during an annual well check? The services and screenings done by your health care provider during your annual well check will depend on your age, overall health, lifestyle risk factors, and family history of disease. Counseling  Your  health care provider may ask you questions about your: Alcohol use. Tobacco use. Drug use. Emotional well-being. Home and relationship well-being. Sexual activity. Eating habits. History of falls. Memory and ability to understand (cognition). Work and work Statistician. Reproductive health. Screening  You may have the following tests or measurements: Height, weight, and BMI. Blood pressure. Lipid and cholesterol levels. These may be checked every 5 years, or more frequently if you are over 88 years old. Skin check. Lung cancer screening. You may have this screening every year starting at age 60 if you have a 30-pack-year history of smoking and currently smoke or have quit within the past 15 years. Fecal occult blood test (FOBT) of the stool. You may have this test every year starting at age 9. Flexible sigmoidoscopy or colonoscopy. You may have a sigmoidoscopy every 5 years or a colonoscopy every 10 years starting at age 31. Hepatitis C blood test. Hepatitis B blood test. Sexually transmitted disease (STD) testing. Diabetes screening. This is done by checking your blood sugar (glucose) after you have not eaten for a while (fasting). You may have this done every 1-3 years. Bone density scan. This is done to screen for osteoporosis. You may have this done starting at age 45. Mammogram. This may be done every 1-2 years. Talk to your health care provider about how often you should have regular mammograms. Talk with your health care provider about your test results, treatment options, and if necessary, the need for more tests. Vaccines  Your health care provider may recommend certain vaccines, such as: Influenza vaccine. This is recommended every year. Tetanus, diphtheria, and acellular pertussis (Tdap, Td) vaccine. You may need a Td booster every 10 years. Zoster vaccine. You may need this after age 57. Pneumococcal 13-valent conjugate (PCV13) vaccine. One dose  is recommended after age  79. Pneumococcal polysaccharide (PPSV23) vaccine. One dose is recommended after age 34. Talk to your health care provider about which screenings and vaccines you need and how often you need them. This information is not intended to replace advice given to you by your health care provider. Make sure you discuss any questions you have with your health care provider. Document Released: 10/21/2015 Document Revised: 06/13/2016 Document Reviewed: 07/26/2015 Elsevier Interactive Patient Education  2017 [employer] Prevention in the Home Falls can cause injuries. They can happen to people of all ages. There are many things you can do to make your home safe and to help prevent falls. What can I do on the outside of my home? Regularly fix the edges of walkways and driveways and fix any cracks. Remove anything that might make you trip as you walk through a door, such as a raised step or threshold. Trim any bushes or trees on the path to your home. Use bright outdoor lighting. Clear any walking paths of anything that might make someone trip, such as rocks or tools. Regularly check to see if handrails are loose or broken. Make sure that both sides of any steps have handrails. Any raised decks and porches should have guardrails on the edges. Have any leaves, snow, or ice cleared regularly. Use sand or salt on walking paths during winter. Clean up any spills in your garage right away. This includes oil or grease spills. What can I do in the bathroom? Use night lights. Install grab bars by the toilet and in the tub and shower. Do not use towel bars as grab bars. Use non-skid mats or decals in the tub or shower. If you need to sit down in the shower, use a plastic, non-slip stool. Keep the floor dry. Clean up any water that spills on the floor as soon as it happens. Remove soap buildup in the tub or shower regularly. Attach bath mats securely with double-sided non-slip rug tape. Do not have throw  rugs and other things on the floor that can make you trip. What can I do in the bedroom? Use night lights. Make sure that you have a light by your bed that is easy to reach. Do not use any sheets or blankets that are too big for your bed. They should not hang down onto the floor. Have a firm chair that has side arms. You can use this for support while you get dressed. Do not have throw rugs and other things on the floor that can make you trip. What can I do in the kitchen? Clean up any spills right away. Avoid walking on wet floors. Keep items that you use a lot in easy-to-reach places. If you need to reach something above you, use a strong step stool that has a grab bar. Keep electrical cords out of the way. Do not use floor polish or wax that makes floors slippery. If you must use wax, use non-skid floor wax. Do not have throw rugs and other things on the floor that can make you trip. What can I do with my stairs? Do not leave any items on the stairs. Make sure that there are handrails on both sides of the stairs and use them. Fix handrails that are broken or loose. Make sure that handrails are as long as the stairways. Check any carpeting to make sure that it is firmly attached to the stairs. Fix any carpet that is loose or worn. Avoid  having throw rugs at the top or bottom of the stairs. If you do have throw rugs, attach them to the floor with carpet tape. Make sure that you have a light switch at the top of the stairs and the bottom of the stairs. If you do not have them, ask someone to add them for you. What else can I do to help prevent falls? Wear shoes that: Do not have high heels. Have rubber bottoms. Are comfortable and fit you well. Are closed at the toe. Do not wear sandals. If you use a stepladder: Make sure that it is fully opened. Do not climb a closed stepladder. Make sure that both sides of the stepladder are locked into place. Ask someone to hold it for you, if  possible. Clearly mark and make sure that you can see: Any grab bars or handrails. First and last steps. Where the edge of each step is. Use tools that help you move around (mobility aids) if they are needed. These include: Canes. Walkers. Scooters. Crutches. Turn on the lights when you go into a dark area. Replace any light bulbs as soon as they burn out. Set up your furniture so you have a clear path. Avoid moving your furniture around. If any of your floors are uneven, fix them. If there are any pets around you, be aware of where they are. Review your medicines with your doctor. Some medicines can make you feel dizzy. This can increase your chance of falling. Ask your doctor what other things that you can do to help prevent falls. This information is not intended to replace advice given to you by your health care provider. Make sure you discuss any questions you have with your health care provider. Document Released: 07/21/2009 Document Revised: 03/01/2016 Document Reviewed: 10/29/2014 Elsevier Interactive Patient Education  2017 [employer].

## 2022-08-02 NOTE — Progress Notes (Signed)
Subjective:   [name] is a 88 y.o. female who presents for Medicare Annual (Subsequent) preventive examination.  Review of Systems     Cardiac Risk Factors include: advanced age (>49mn, >>31women);hypertension;obesity (BMI >30kg/m2)     Objective:    Today's Vitals   08/02/22 1220  BP: 102/60  Pulse: 73  Temp: (!) 97.5 F (36.4 C)  TempSrc: Oral  SpO2: 96%  Weight: 191 lb (86.6 kg)  Height: '5\' 4"'$  (1.626 m)   Body mass index is 32.79 kg/m.     08/02/2022   12:34 PM 07/19/2021   11:18 AM 07/13/2020   11:47 AM 02/24/2020   10:00 PM 02/18/2020   11:14 AM 07/08/2019   10:57 AM 12/19/2018    2:11 PM  Advanced Directives  Does Patient Have a Medical Advance Directive? Yes Yes Yes No No No Yes  Type of AParamedicof AFithianLiving will [place]Living will [place] will    [place] will  Does patient want to make changes to medical advance directive?       No - Patient declined  Copy of [place] Chart? No - copy requested No - copy requested No - copy requested    No - copy requested  Would patient like information on creating a medical advance directive?    No - Patient declined No - Patient declined  No - Patient declined    Current Medications (verified) Outpatient Encounter Medications as of 08/02/2022  Medication Sig   Acetaminophen (TYLENOL PO) Take by mouth.   busPIRone (BUSPAR) 5 MG tablet TAKE 1 TABLET(5 MG) BY MOUTH DAILY AS NEEDED FOR ANXIETY   calcium carbonate (OS-CAL) 600 MG TABS tablet Take 600 mg by mouth daily.   cholecalciferol (VITAMIN D3) 25 MCG (1000 UT) tablet Take 1,000 Units by mouth daily.   diclofenac Sodium (VOLTAREN) 1 % GEL APPLY 2 GRAMS 4 TIMES DAILY AS DIRECTED   fenofibrate (TRICOR) 145 MG tablet TAKE 1 TABLET DAILY   Flaxseed, Linseed, (FLAXSEED OIL) 1000 MG CAPS Take 1,000 mg by mouth daily.   furosemide (LASIX)  20 MG tablet TAKE 1 TABLET DAILY   lidocaine (LIDODERM) 5 % Place 1 patch onto the skin daily as needed (hip pain).   methimazole (TAPAZOLE) 5 MG tablet Take 2.5 mg by mouth daily.    metoprolol succinate (TOPROL-XL) 25 MG 24 hr tablet Take 1 tablet (25 mg total) by mouth daily.   nystatin cream (MYCOSTATIN) Apply 1 application. topically 2 (two) times daily as needed for dry skin. APPLY CREAM TO AFFECTED AREA TWICE A DAY AS NEEDED Strength: 100,000 UNIT/GM   potassium chloride SA (KLOR-CON M) 20 MEQ tablet TAKE 1 TABLET BY MOUTH EVERY DAY   famotidine (PEPCID) 20 MG tablet TAKE 1 TABLET(20 MG) BY MOUTH DAILY (Patient not taking: Reported on 07/12/2022)   No facility-administered encounter medications on file as of 08/02/2022.    Allergies (verified) Aleve [naproxen], Ibuprofen, and Naproxen sodium   History: Past Medical History:  Diagnosis Date   Aortic stenosis    Echo 07/05/16: Very mild AS, Mean gradient 10 mm Hg, Peak gradient (S) 19 mmHg    Arthritis    left hip and back   Bronchiectasis    isolated to RML; status post right middle lobe partial resection.   Complication of anesthesia    hard to wake up   Dyspnea    Gall stones  GERD (gastroesophageal reflux disease)    history    H/O osteoporosis    Hypertension    Hyperthyroidism    endocrinologist - Dr. Chalmers Cater   Pneumonia    Yeast infection    Past Surgical History:  Procedure Laterality Date   ABDOMINAL HYSTERECTOMY  1974   CHOLECYSTECTOMY N/A 07/02/2016   Procedure: LAPAROSCOPIC CHOLECYSTECTOMY;  Surgeon: Stark Klein, MD;  Location: [facility];  Service: General;  Laterality: N/A;   COLONOSCOPY     ENDOSCOPIC RETROGRADE CHOLANGIOPANCREATOGRAPHY (ERCP) WITH PROPOFOL N/A 08/07/2018   Procedure: ENDOSCOPIC RETROGRADE CHOLANGIOPANCREATOGRAPHY (ERCP) WITH PROPOFOL;  Surgeon: Carol Ada, MD;  Location: [facility];  Service: Endoscopy;  Laterality: N/A;   EYE SURGERY     stye removed   HARDWARE REMOVAL Left 02/24/2020    Procedure: Hardware Removal, left knee;  Surgeon: [name], MD;  Location: [place];  Service: Orthopedics;  Laterality: Left;   HERNIA REPAIR  1975   KNEE SURGERY Left    LUNG REMOVAL, PARTIAL  208   RML   NM MYOVIEW LTD  06/2011   Normal EF. No ischemia or infarction.   SPHINCTEROTOMY  08/07/2018   Procedure: SPHINCTEROTOMY;  Surgeon: Carol Ada, MD;  Location: [facility];  Service: Endoscopy;;  balloon sweep   STERIOD INJECTION Right 02/24/2020   Procedure: RIGHT KNEE STEROID INTRA-ARTICULAR MARCAINE/DEPO-MEDROL INJECTION UNDER ANESTHESIA;  Surgeon: [name], MD;  Location: [facility];  Service: Orthopedics;  Laterality: Right;   TOTAL HIP ARTHROPLASTY Left 04/05/2017   Procedure: LEFT TOTAL HIP ARTHROPLASTY ANTERIOR APPROACH;  Surgeon: [name], MD;  Location: [place];  Service: Orthopedics;  Laterality: Left;   TOTAL HIP ARTHROPLASTY Right 12/19/2018   TOTAL HIP ARTHROPLASTY Right 12/19/2018   Procedure: RIGHT TOTAL HIP ARTHROPLASTY-DIRECT ANTERIOR APPROACH;  Surgeon: [name], MD;  Location: [facility] [place];  Service: Orthopedics;  Laterality: Right;   TOTAL KNEE ARTHROPLASTY Left 02/24/2020   Procedure: LEFT TOTAL KNEE ARTHROPLASTY;  Surgeon: [name], MD;  Location: [place];  Service: Orthopedics;  Laterality: Left;   TRANSTHORACIC ECHOCARDIOGRAM  06/2011   Mild concentric LVH. Normal EF with impaired relaxation. Mildly elevated RV pressures of 30 and 40 mmHg.   TRANSTHORACIC ECHOCARDIOGRAM  06/2016   normal LV size and function. EF 60-65% with GRD 2 DD. Mild aortic stenosis. Mild LA dilation. Mild to moderately increased PA pressures (46 mmHg).   TUBAL LIGATION     Family History  Problem Relation Age of Onset   Hypertension Mother    CVA Mother 61   Heart attack Sister    Diabetes Sister    Healthy Father    Heart attack Brother    Heart attack Brother    Social History   Socioeconomic History   Marital status: Widowed    Spouse name: Not on file   Number of  children: Not on file   Years of education: Not on file   Highest education level: Not on file  Occupational History   Occupation: retired  Tobacco Use   Smoking status: Never   Smokeless tobacco: Never  Vaping Use   Vaping Use: Never used  Substance and Sexual Activity   Alcohol use: No   Drug use: No   Sexual activity: Not Currently  Other Topics Concern   Not on file  Social History Narrative   She is a widowed mother of 34, grandmother for, a great-grandmother 50.   She has been trying to do more exercise than before, but has  been injured recently by her back pain.   She never smoked, doesn't drink alcohol.   Social Determinants of Health   Financial Resource Strain: Low Risk  (08/02/2022)   Overall Financial Resource Strain (CARDIA)    Difficulty of Paying Living Expenses: Not hard at all  Food Insecurity: No Food Insecurity (08/02/2022)   Hunger Vital Sign    Worried About Running Out of Food in the Last Year: Never true    Ran Out of Food in the Last Year: Never true  Transportation Needs: No Transportation Needs (08/02/2022)   PRAPARE - Hydrologist (Medical): No    Lack of Transportation (Non-Medical): No  Physical Activity: Inactive (08/02/2022)   Exercise Vital Sign    Days of Exercise per Week: 0 days    Minutes of Exercise per Session: 0 min  Stress: Stress Concern Present (08/02/2022)   [place]    Feeling of Stress : To some extent  Social Connections: Not on file    Tobacco Counseling Counseling given: Not Answered   Clinical Intake:  Pre-visit preparation completed: Yes  Pain : No/denies pain     Nutritional Status: BMI > 30  Obese Nutritional Risks: Nausea/ vomitting/ diarrhea (nausea and vomiting this morning, resolved) Diabetes: No  How often do you need to have someone help you when you read instructions, pamphlets, or other written materials  from your doctor or pharmacy?: 1 - Never  Diabetic? no  Interpreter Needed?: No  Information entered by :: NAllen LPN   Activities of Daily Living    08/02/2022   12:35 PM  In your present state of health, do you have any difficulty performing the following activities:  Hearing? 0  Vision? 0  Difficulty concentrating or making decisions? 1  Walking or climbing stairs? 1  Dressing or bathing? 0  Doing errands, shopping? 0  Preparing Food and eating ? N  Using the Toilet? N  In the past six months, have you accidently leaked urine? Y  Do you have problems with loss of bowel control? Y  Managing your Medications? N  Managing your Finances? N  Housekeeping or managing your Housekeeping? N    Patient Care Team: Glendale Chard, MD as PCP - General (Internal Medicine) Leonie Man, MD as PCP - Cardiology (Cardiology) [name], MD as Consulting Physician (Orthopedic Surgery)  Indicate any recent Medical Services you may have received from other than [facility] providers in the past year (date may be approximate).     Assessment:   This is a routine wellness examination for Jalisia.  Hearing/Vision screen Vision Screening - Comments:: Regular eye exams, [facility]  Dietary issues and exercise activities discussed: Current Exercise Habits: The patient does not participate in regular exercise at present   Goals Addressed             This Visit's Progress    Patient Stated       08/02/2022, wants to get to 170 pounds       Depression Screen    08/02/2022   12:35 PM 07/19/2021   11:19 AM 07/13/2020   11:48 AM 01/05/2020   11:27 AM 08/12/2019    9:49 AM 07/08/2019   10:59 AM 07/08/2019   10:31 AM  PHQ 2/9 Scores  PHQ - 2 Score 0 0 0 1 0 0 0  PHQ- 9 Score    7       Fall  Risk    08/02/2022   12:35 PM 07/19/2021   11:18 AM 07/13/2020   11:48 AM 01/05/2020   11:27 AM 08/12/2019    9:49 AM  Fall Risk   Falls in the past year? 0 0 0  0  Number falls in  past yr: 0   0   Injury with Fall? 0   0   Risk for fall due to : Impaired mobility;Medication side effect Impaired balance/gait;Impaired mobility;Medication side effect Impaired balance/gait;Medication side effect;Orthopedic patient    Follow up Falls prevention discussed;Education provided;Falls evaluation completed Falls evaluation completed;Education provided;Falls prevention discussed Falls evaluation completed;Education provided;Falls prevention discussed      FALL RISK PREVENTION PERTAINING TO THE HOME:  Any stairs in or around the home? Yes  If so, are there any without handrails?  Stair lift Home free of loose throw rugs in walkways, pet beds, electrical cords, etc? Yes  Adequate lighting in your home to reduce risk of falls? Yes   ASSISTIVE DEVICES UTILIZED TO PREVENT FALLS:  Life alert? No  Use of a cane, walker or w/c? Yes  Grab bars in the bathroom? Yes  Shower chair or bench in shower? Yes  Elevated toilet seat or a handicapped toilet? Yes   TIMED UP AND GO:  Was the test performed? Yes .  Length of time to ambulate 10 feet: 7 sec.   Gait slow and steady with assistive device  Cognitive Function:        08/02/2022   12:37 PM 07/19/2021   11:21 AM 07/13/2020   11:50 AM 07/08/2019   11:01 AM  6CIT Screen  What Year? 0 points 0 points 0 points 0 points  What month? 0 points 0 points 0 points 0 points  What time? 3 points 0 points 0 points 0 points  Count back from 20 0 points 0 points 2 points 0 points  Months in reverse 0 points 2 points 2 points 0 points  Repeat phrase 8 points 8 points 0 points 0 points  Total Score 11 points 10 points 4 points 0 points    Immunizations Immunization History  Administered Date(s) Administered   PFIZER(Purple Top)SARS-COV-2 Vaccination 12/05/2019, 12/30/2019, 09/08/2020   Tdap 01/04/2022   Zoster Recombinat (Shingrix) 07/04/2021, 09/02/2021    TDAP status: Up to date  Flu Vaccine status: Declined, Education has been  provided regarding the importance of this vaccine but patient still declined. Advised may receive this vaccine at local pharmacy or Health Dept. Aware to provide a copy of the vaccination record if obtained from local pharmacy or Health Dept. Verbalized acceptance and understanding.  Pneumococcal vaccine status: Declined,  Education has been provided regarding the importance of this vaccine but patient still declined. Advised may receive this vaccine at local pharmacy or Health Dept. Aware to provide a copy of the vaccination record if obtained from local pharmacy or Health Dept. Verbalized acceptance and understanding.   Covid-19 vaccine status: Completed vaccines  Qualifies for Shingles Vaccine? Yes   Zostavax completed Yes   Shingrix Completed?: Yes  Screening Tests Health Maintenance  Topic Date Due   COVID-19 Vaccine (4 - Pfizer risk series) 11/03/2020   Medicare Annual Wellness (AWV)  08/18/2022   Pneumonia Vaccine 41+ Years old (1 - PCV) 01/05/2023 (Originally 05/24/2000)   INFLUENZA VACCINE  01/06/2023 (Originally 05/08/2022)   MAMMOGRAM  08/03/2023 (Originally 07/20/2022)   TETANUS/TDAP  01/05/2032   DEXA SCAN  Completed   Zoster Vaccines- Shingrix  Completed   HPV VACCINES  Aged Out    Health Maintenance  Health Maintenance Due  Topic Date Due   COVID-19 Vaccine (4 - Pfizer risk series) 11/03/2020   Medicare Annual Wellness (AWV)  08/18/2022    Colorectal cancer screening: No longer required.   Mammogram status: Completed 07/20/2021. Repeat every 2 years  Bone Density status: Completed 07/20/2021.   Lung Cancer Screening: (Low Dose CT Chest recommended if Age 63-80 years, 30 pack-year currently smoking OR have quit w/in 15years.) does not qualify.   Lung Cancer Screening Referral: no  Additional Screening:  Hepatitis C Screening: does not qualify;  Vision Screening: Recommended annual ophthalmology exams for early detection of glaucoma and other disorders of the  eye. Is the patient up to date with their annual eye exam?  Yes  Who is the provider or what is the name of the office in which the patient attends annual eye exams? [facility] If pt is not established with a provider, would they like to be referred to a provider to establish care? No .   Dental Screening: Recommended annual dental exams for proper oral hygiene  Community Resource Referral / Chronic Care Management: CRR required this visit?  No   CCM required this visit?  No      Plan:     I have personally reviewed and noted the following in the patient's chart:   Medical and social history Use of alcohol, tobacco or illicit drugs  Current medications and supplements including opioid prescriptions. Patient is not currently taking opioid prescriptions. Functional ability and status Nutritional status Physical activity Advanced directives List of other physicians Hospitalizations, surgeries, and ER visits in previous 12 months Vitals Screenings to include cognitive, depression, and falls Referrals and appointments  In addition, I have reviewed and discussed with patient certain preventive protocols, quality metrics, and best practice recommendations. A written personalized care plan for preventive services as well as general preventive health recommendations were provided to patient.     Kellie Simmering, LPN   86/76/1948   Nurse Notes: none

## 2022-09-24 ENCOUNTER — Other Ambulatory Visit: Payer: Self-pay | Admitting: Internal Medicine

## 2022-09-26 ENCOUNTER — Encounter: Payer: Self-pay | Admitting: Internal Medicine

## 2022-11-14 DIAGNOSIS — H35363 Drusen (degenerative) of macula, bilateral: Secondary | ICD-10-CM | POA: Diagnosis not present

## 2022-11-14 DIAGNOSIS — H5203 Hypermetropia, bilateral: Secondary | ICD-10-CM | POA: Diagnosis not present

## 2022-11-14 DIAGNOSIS — H25813 Combined forms of age-related cataract, bilateral: Secondary | ICD-10-CM | POA: Diagnosis not present

## 2022-11-29 DIAGNOSIS — E0590 Thyrotoxicosis, unspecified without thyrotoxic crisis or storm: Secondary | ICD-10-CM | POA: Diagnosis not present

## 2022-12-13 ENCOUNTER — Other Ambulatory Visit: Payer: Self-pay | Admitting: Internal Medicine

## 2022-12-13 DIAGNOSIS — M25562 Pain in left knee: Secondary | ICD-10-CM | POA: Diagnosis not present

## 2022-12-13 DIAGNOSIS — T8484XA Pain due to internal orthopedic prosthetic devices, implants and grafts, initial encounter: Secondary | ICD-10-CM | POA: Diagnosis not present

## 2023-01-15 ENCOUNTER — Encounter: Payer: Self-pay | Admitting: Internal Medicine

## 2023-01-15 ENCOUNTER — Ambulatory Visit (INDEPENDENT_AMBULATORY_CARE_PROVIDER_SITE_OTHER): Payer: Medicare Other | Admitting: Internal Medicine

## 2023-01-15 VITALS — BP 110/70 | HR 48 | Temp 97.9°F | Ht 64.0 in | Wt 192.4 lb

## 2023-01-15 DIAGNOSIS — Z2821 Immunization not carried out because of patient refusal: Secondary | ICD-10-CM

## 2023-01-15 DIAGNOSIS — I130 Hypertensive heart and chronic kidney disease with heart failure and stage 1 through stage 4 chronic kidney disease, or unspecified chronic kidney disease: Secondary | ICD-10-CM | POA: Diagnosis not present

## 2023-01-15 DIAGNOSIS — E7800 Pure hypercholesterolemia, unspecified: Secondary | ICD-10-CM | POA: Diagnosis not present

## 2023-01-15 DIAGNOSIS — M25562 Pain in left knee: Secondary | ICD-10-CM | POA: Diagnosis not present

## 2023-01-15 DIAGNOSIS — I5032 Chronic diastolic (congestive) heart failure: Secondary | ICD-10-CM

## 2023-01-15 DIAGNOSIS — I700 Atherosclerosis of aorta: Secondary | ICD-10-CM | POA: Diagnosis not present

## 2023-01-15 DIAGNOSIS — Z6833 Body mass index (BMI) 33.0-33.9, adult: Secondary | ICD-10-CM

## 2023-01-15 DIAGNOSIS — G8929 Other chronic pain: Secondary | ICD-10-CM

## 2023-01-15 DIAGNOSIS — N182 Chronic kidney disease, stage 2 (mild): Secondary | ICD-10-CM

## 2023-01-15 DIAGNOSIS — E6609 Other obesity due to excess calories: Secondary | ICD-10-CM | POA: Diagnosis not present

## 2023-01-15 DIAGNOSIS — Z01810 Encounter for preprocedural cardiovascular examination: Secondary | ICD-10-CM | POA: Diagnosis not present

## 2023-01-15 LAB — POCT URINALYSIS DIPSTICK
Blood, UA: NEGATIVE
Glucose, UA: NEGATIVE
Ketones, UA: NEGATIVE
Nitrite, UA: POSITIVE
Protein, UA: NEGATIVE
Spec Grav, UA: >=1.03 — AB (ref 1.010–1.025)
Urobilinogen, UA: 1 E.U./dL
pH, UA: 5.5 (ref 5.0–8.0)

## 2023-01-15 NOTE — Progress Notes (Signed)
I,[name],acting as a scribe for Savannah Aliment, MD.,have documented all relevant documentation on the behalf of Savannah Aliment, MD,as directed by  Savannah Aliment, MD while in the presence of Savannah Aliment, MD.   Subjective:     Patient ID: [name] , female    DOB: 03-04-1933 , 89 y.o.   MRN: [id number]   Chief Complaint  Patient presents with   Hypertension   Hyperlipidemia    HPI  The patient is here today for HTN & cholesterol f/u. She reports compliance with meds. She denies headaches, chest pain and shortness of breath.   She has been having worsening left knee pain. She has been followed by Dr. Ophelia Charter in the past - who reportedly stated that there was "nothing wrong with her knee".  However, she went for a second opinion w/ Dr. Turner Daniels who stated her knee pain is because the "replacement has shifted".  He has suggested a revision of her TKR. She is hesitant because she has been with Dr. Ophelia Charter for so long. She would like to start the process for pre-operative evaluation.      Hypertension This is a chronic problem. The current episode started more than 1 year ago. The problem has been gradually improving since onset. The problem is controlled. Pertinent negatives include no blurred vision. Risk factors for coronary artery disease include dyslipidemia, obesity and post-menopausal state. Past treatments include beta blockers. The current treatment provides moderate improvement. Compliance problems include exercise.      Past Medical History:  Diagnosis Date   Aortic stenosis    Echo 07/05/16: Very mild AS, Mean gradient 10 mm Hg, Peak gradient (S) 19 mmHg    Arthritis    left hip and back   Bronchiectasis    isolated to RML; status post right middle lobe partial resection.   Complication of anesthesia    hard to wake up   Dyspnea    Gall stones    GERD (gastroesophageal reflux disease)    history    H/O osteoporosis    Hypertension    Hyperthyroidism     endocrinologist - Dr. Talmage Nap   Pneumonia    Yeast infection      Family History  Problem Relation Age of Onset   Hypertension Mother    CVA Mother 82   Heart attack Sister    Diabetes Sister    Healthy Father    Heart attack Brother    Heart attack Brother      Current Outpatient Medications:    Acetaminophen (TYLENOL PO), Take by mouth., Disp: , Rfl:    busPIRone (BUSPAR) 5 MG tablet, TAKE 1 TABLET(5 MG) BY MOUTH DAILY AS NEEDED FOR ANXIETY, Disp: 90 tablet, Rfl: 1   calcium carbonate (OS-CAL) 600 MG TABS tablet, Take 600 mg by mouth daily., Disp: , Rfl:    cholecalciferol (VITAMIN D3) 25 MCG (1000 UT) tablet, Take 1,000 Units by mouth daily., Disp: , Rfl:    diclofenac Sodium (VOLTAREN) 1 % GEL, APPLY 2 GRAMS 4 TIMES DAILY AS DIRECTED, Disp: 500 g, Rfl: 1   famotidine (PEPCID) 20 MG tablet, TAKE 1 TABLET(20 MG) BY MOUTH DAILY, Disp: 90 tablet, Rfl: 1   fenofibrate (TRICOR) 145 MG tablet, TAKE 1 TABLET BY MOUTH EVERY DAY, Disp: 90 tablet, Rfl: 3   Flaxseed, Linseed, (FLAXSEED OIL) 1000 MG CAPS, Take 1,000 mg by mouth daily., Disp: , Rfl:    furosemide (LASIX) 20 MG tablet, TAKE 1 TABLET  BY MOUTH ONCE DAILY, Disp: 90 tablet, Rfl: 3   lidocaine (LIDODERM) 5 %, Place 1 patch onto the skin daily as needed (hip pain)., Disp: 90 patch, Rfl: 1   methimazole (TAPAZOLE) 5 MG tablet, Take 2.5 mg by mouth daily. , Disp: , Rfl:    metoprolol succinate (TOPROL-XL) 25 MG 24 hr tablet, Take 1 tablet (25 mg total) by mouth daily., Disp: 90 tablet, Rfl: 3   nystatin cream (MYCOSTATIN), Apply 1 application. topically 2 (two) times daily as needed for dry skin. APPLY CREAM TO AFFECTED AREA TWICE A DAY AS NEEDED Strength: 100,000 UNIT/GM, Disp: 30 g, Rfl: 1   potassium chloride SA (KLOR-CON M) 20 MEQ tablet, TAKE 1 TABLET BY MOUTH EVERY DAY, Disp: 90 tablet, Rfl: 3   Allergies  Allergen Reactions   Aleve [Naproxen]     Other reaction(s): hallucinations   Ibuprofen Other (See Comments)     hallucinations Other reaction(s): hallucinations   Naproxen Sodium Other (See Comments)    hallucinations     Review of Systems  Constitutional: Negative.   Eyes:  Negative for blurred vision.  Respiratory: Negative.    Cardiovascular: Negative.   Gastrointestinal: Negative.   Musculoskeletal:  Positive for arthralgias.  Skin: Negative.   Neurological: Negative.   Psychiatric/Behavioral: Negative.       Today's Vitals   01/15/23 1046  BP: 110/70  Pulse: (!) 48  Temp: 97.9 F (36.6 C)  SpO2: 98%  Weight: 192 lb 6.4 oz (87.3 kg)  Height: 5\' 4"  (1.626 m)   Body mass index is 33.03 kg/m.  Wt Readings from Last 3 Encounters:  01/15/23 192 lb 6.4 oz (87.3 kg)  08/02/22 191 lb (86.6 kg)  07/12/22 194 lb 3.2 oz (88.1 kg)    Objective:  Physical Exam Vitals and nursing note reviewed.  Constitutional:      Appearance: Normal appearance.  HENT:     Head: Normocephalic and atraumatic.     Nose:     Comments: Masked    Mouth/Throat:     Comments: Masked  Eyes:     Extraocular Movements: Extraocular movements intact.  Cardiovascular:     Rate and Rhythm: Normal rate and regular rhythm.     Heart sounds: Normal heart sounds.  Pulmonary:     Effort: Pulmonary effort is normal.     Breath sounds: Normal breath sounds.  Musculoskeletal:     Cervical back: Normal range of motion.     Comments: Ambulatory w/ cane Healed surgical scar on left knee  Skin:    General: Skin is warm.  Neurological:     General: No focal deficit present.     Mental Status: She is alert.  Psychiatric:        Mood and Affect: Mood normal.        Behavior: Behavior normal.       Assessment And Plan:     1. Hypertensive heart and renal disease with renal failure, stage 1 through stage 4 or unspecified chronic kidney disease, with heart failure Comments: Chronic, well controlled.  She will c/w metoprolol XL and furosemide daily. I will check renal function today. - CMP14+EGFR - CBC - Lipid  panel - POCT Urinalysis Dipstick (81002) - Microalbumin / creatinine urine ratio - Ambulatory referral to Cardiology  2. Diastolic CHF, chronic Comments: Most recent echo, 2017. I will schedule her for f/u echo and refer her to Cardiology for further eval and pre-op evaluation. - Ambulatory referral to Cardiology  3. Atherosclerosis  of aorta Comments: Chronic, LDL goal < 70.  She has a remote h/o of statin intolerance. - Lipid panel - Ambulatory referral to Cardiology  4. Chronic renal disease, stage II Comments: Chronic, I will check renal function today. She is encouraged to limit NSAID use and to stay well hydrated. - CMP14+EGFR  5. Hypercholesteremia Comments: Chronic, she is currently on fenofibrate.  I will check liver function today. - Lipid panel  6. Chronic pain of left knee Comments: I will request notes from [facility]. She is contemplating L-TKR revision.  7. Class 1 obesity due to excess calories with serious comorbidity and body mass index (BMI) of 33.0 to 33.9 in adult Comments: She is encouraged to aim for at least 150 minutes of exercise/week, while striving for BMI<30 to decrease cardiac risk.  8. Pneumococcal vaccination declined  9. Preop cardiovascular exam - Ambulatory referral to Cardiology  Patient was given opportunity to ask questions. Patient verbalized understanding of the plan and was able to repeat key elements of the plan. All questions were answered to their satisfaction.   I, Savannah Aliment, MD, have reviewed all documentation for this visit. The documentation on 01/15/23 for the exam, diagnosis, procedures, and orders are all accurate and complete.   IF YOU HAVE BEEN REFERRED TO A SPECIALIST, IT MAY TAKE 1-2 WEEKS TO SCHEDULE/PROCESS THE REFERRAL. IF YOU HAVE NOT HEARD FROM US/SPECIALIST IN TWO WEEKS, PLEASE GIVE [place] A CALL AT [phone] X [phone].   THE PATIENT IS ENCOURAGED TO PRACTICE SOCIAL DISTANCING DUE TO THE COVID-19 PANDEMIC.

## 2023-01-15 NOTE — Patient Instructions (Signed)
Hypertension, Adult ?Hypertension is another name for high blood pressure. High blood pressure forces your heart to work harder to pump blood. This can cause problems over time. ?There are two numbers in a blood pressure reading. There is a top number (systolic) over a bottom number (diastolic). It is best to have a blood pressure that is below 120/80. ?What are the causes? ?The cause of this condition is not known. Some other conditions can lead to high blood pressure. ?What increases the risk? ?Some lifestyle factors can make you more likely to develop high blood pressure: ?Smoking. ?Not getting enough exercise or physical activity. ?Being overweight. ?Having too much fat, sugar, calories, or salt (sodium) in your diet. ?Drinking too much alcohol. ?Other risk factors include: ?Having any of these conditions: ?Heart disease. ?Diabetes. ?High cholesterol. ?Kidney disease. ?Obstructive sleep apnea. ?Having a family history of high blood pressure and high cholesterol. ?Age. The risk increases with age. ?Stress. ?What are the signs or symptoms? ?High blood pressure may not cause symptoms. Very high blood pressure (hypertensive crisis) may cause: ?Headache. ?Fast or uneven heartbeats (palpitations). ?Shortness of breath. ?Nosebleed. ?Vomiting or feeling like you may vomit (nauseous). ?Changes in how you see. ?Very bad chest pain. ?Feeling dizzy. ?Seizures. ?How is this treated? ?This condition is treated by making healthy lifestyle changes, such as: ?Eating healthy foods. ?Exercising more. ?Drinking less alcohol. ?Your doctor may prescribe medicine if lifestyle changes do not help enough and if: ?Your top number is above 130. ?Your bottom number is above 80. ?Your personal target blood pressure may vary. ?Follow these instructions at home: ?Eating and drinking ? ?If told, follow the DASH eating plan. To follow this plan: ?Fill one half of your plate at each meal with fruits and vegetables. ?Fill one fourth of your plate  at each meal with whole grains. Whole grains include whole-wheat pasta, brown rice, and whole-grain bread. ?Eat or drink low-fat dairy products, such as skim milk or low-fat yogurt. ?Fill one fourth of your plate at each meal with low-fat (lean) proteins. Low-fat proteins include fish, chicken without skin, eggs, beans, and tofu. ?Avoid fatty meat, cured and processed meat, or chicken with skin. ?Avoid pre-made or processed food. ?Limit the amount of salt in your diet to less than 1,500 mg each day. ?Do not drink alcohol if: ?Your doctor tells you not to drink. ?You are pregnant, may be pregnant, or are planning to become pregnant. ?If you drink alcohol: ?Limit how much you have to: ?0-1 drink a day for women. ?0-2 drinks a day for men. ?Know how much alcohol is in your drink. In the [place] one drink equals one 12 oz bottle of beer (355 mL), one 5 oz glass of wine (148 mL), or one 1? oz glass of hard liquor (44 mL). ?Lifestyle ? ?Work with your doctor to stay at a healthy weight or to lose weight. Ask your doctor what the best weight is for you. ?Get at least 30 minutes of exercise that causes your heart to beat faster (aerobic exercise) most days of the week. This may include walking, swimming, or biking. ?Get at least 30 minutes of exercise that strengthens your muscles (resistance exercise) at least 3 days a week. This may include lifting weights or doing Pilates. ?Do not smoke or use any products that contain nicotine or tobacco. If you need help quitting, ask your doctor. ?Check your blood pressure at home as told by your doctor. ?Keep all follow-up visits. ?Medicines ?Take over-the-counter and prescription medicines   only as told by your doctor. Follow directions carefully. ?Do not skip doses of blood pressure medicine. The medicine does not work as well if you skip doses. Skipping doses also puts you at risk for problems. ?Ask your doctor about side effects or reactions to medicines that you should watch  for. ?Contact a doctor if: ?You think you are having a reaction to the medicine you are taking. ?You have headaches that keep coming back. ?You feel dizzy. ?You have swelling in your ankles. ?You have trouble with your vision. ?Get help right away if: ?You get a very bad headache. ?You start to feel mixed up (confused). ?You feel weak or numb. ?You feel faint. ?You have very bad pain in your: ?Chest. ?Belly (abdomen). ?You vomit more than once. ?You have trouble breathing. ?These symptoms may be an emergency. Get help right away. Call 911. ?Do not wait to see if the symptoms will go away. ?Do not drive yourself to the hospital. ?Summary ?Hypertension is another name for high blood pressure. ?High blood pressure forces your heart to work harder to pump blood. ?For most people, a normal blood pressure is less than 120/80. ?Making healthy choices can help lower blood pressure. If your blood pressure does not get lower with healthy choices, you may need to take medicine. ?This information is not intended to replace advice given to you by your health care provider. Make sure you discuss any questions you have with your health care provider. ?Document Revised: 07/13/2021 Document Reviewed: 07/13/2021 ?Elsevier Patient Education ? 2023 Elsevier Inc. ? ?

## 2023-01-16 ENCOUNTER — Other Ambulatory Visit: Payer: Self-pay

## 2023-01-16 LAB — CMP14+EGFR
ALT: 11 IU/L (ref 0–32)
AST: 17 IU/L (ref 0–40)
Albumin/Globulin Ratio: 1.1 — ABNORMAL LOW (ref 1.2–2.2)
Albumin: 4 g/dL (ref 3.7–4.7)
Alkaline Phosphatase: 73 IU/L (ref 44–121)
BUN/Creatinine Ratio: 22 (ref 12–28)
BUN: 15 mg/dL (ref 8–27)
Bilirubin Total: 0.5 mg/dL (ref 0.0–1.2)
CO2: 22 mmol/L (ref 20–29)
Calcium: 9.5 mg/dL (ref 8.7–10.3)
Chloride: 105 mmol/L (ref 96–106)
Creatinine, Ser: 0.67 mg/dL (ref 0.57–1.00)
Globulin, Total: 3.7 g/dL (ref 1.5–4.5)
Glucose: 84 mg/dL (ref 70–99)
Potassium: 4.2 mmol/L (ref 3.5–5.2)
Sodium: 140 mmol/L (ref 134–144)
Total Protein: 7.7 g/dL (ref 6.0–8.5)
eGFR: 85 mL/min/{1.73_m2} (ref >59)

## 2023-01-16 LAB — LIPID PANEL
Chol/HDL Ratio: 3.3 ratio (ref 0.0–4.4)
Cholesterol, Total: 196 mg/dL (ref 100–199)
HDL: 59 mg/dL (ref >39)
LDL Chol Calc (NIH): 123 mg/dL — ABNORMAL HIGH (ref 0–99)
Triglycerides: 76 mg/dL (ref 0–149)
VLDL Cholesterol Cal: 14 mg/dL (ref 5–40)

## 2023-01-16 LAB — CBC
Hematocrit: 37.1 % (ref 34.0–46.6)
Hemoglobin: 12.3 g/dL (ref 11.1–15.9)
MCH: 26.9 pg (ref 26.6–33.0)
MCHC: 33.2 g/dL (ref 31.5–35.7)
MCV: 81 fL (ref 79–97)
Platelets: 413 10*3/uL (ref 150–450)
RBC: 4.58 x10E6/uL (ref 3.77–5.28)
RDW: 15.6 % — ABNORMAL HIGH (ref 11.7–15.4)
WBC: 5 10*3/uL (ref 3.4–10.8)

## 2023-01-16 LAB — MICROALBUMIN / CREATININE URINE RATIO
Creatinine, Urine: 197.6 mg/dL
Microalb/Creat Ratio: 15 mg/g creat (ref 0–29)
Microalbumin, Urine: 29.1 ug/mL

## 2023-01-16 MED ORDER — CEPHALEXIN 500 MG PO CAPS: ORAL_CAPSULE | ORAL | 0 refills | Status: DC

## 2023-01-17 ENCOUNTER — Other Ambulatory Visit: Payer: Self-pay

## 2023-01-17 MED ORDER — CEPHALEXIN 500 MG PO CAPS: ORAL_CAPSULE | ORAL | 0 refills | Status: DC

## 2023-01-24 DIAGNOSIS — M25562 Pain in left knee: Secondary | ICD-10-CM | POA: Diagnosis not present

## 2023-01-25 ENCOUNTER — Telehealth: Payer: Self-pay | Admitting: *Deleted

## 2023-01-25 DIAGNOSIS — Z1231 Encounter for screening mammogram for malignant neoplasm of breast: Secondary | ICD-10-CM | POA: Diagnosis not present

## 2023-01-25 LAB — HM MAMMOGRAPHY: HM Mammogram: NORMAL (ref 0–4)

## 2023-01-25 NOTE — Telephone Encounter (Signed)
Primary Cardiologist:David Herbie [place], MD  Chart reviewed as part of pre-operative protocol coverage. Because of [name] past medical history and time since last visit, he/she will require a follow-up visit in order to better assess preoperative cardiovascular risk.  Pre-op covering staff: - Patient has an appointment 02/13/23 at which time clearance will be addressed. Appointment notes have been updated.  - Please contact requesting surgeon's office via preferred method (i.e, phone, fax) to inform them of need for appointment prior to surgery.  If applicable, this message will also be routed to pharmacy pool and/or primary cardiologist for input on holding anticoagulant/antiplatelet agent as requested below so that this information is available at time of patient's appointment.   Levi Aland, NP-C  01/25/2023, 12:41 PM [address] Office [phone] Fax [phone]

## 2023-01-25 NOTE — Telephone Encounter (Signed)
Lvm on surgery scheduler Lurena Joiner) vm in regards to patients upcoming appointment for preop clearance

## 2023-01-25 NOTE — Telephone Encounter (Signed)
   Pre-operative Risk Assessment    Patient Name: [name]  DOB: Feb 20, 1933 MRN: [id number]      Request for Surgical Clearance    Procedure:   Revision Left Total Knee Arthroplasty.  Date of Surgery:  Clearance TBD                                 Surgeon:  Dr. Gean Birchwood Surgeon's Group or Practice Name:  [facility] Phone number:  [phone] Fax number:  [phone]   Type of Clearance Requested:   - Medical    Type of Anesthesia:  Spinal   Additional requests/questions:    Signed, Emmit Pomfret   01/25/2023, 11:56 AM

## 2023-01-29 DIAGNOSIS — H25013 Cortical age-related cataract, bilateral: Secondary | ICD-10-CM | POA: Diagnosis not present

## 2023-01-29 DIAGNOSIS — H18413 Arcus senilis, bilateral: Secondary | ICD-10-CM | POA: Diagnosis not present

## 2023-01-29 DIAGNOSIS — H2512 Age-related nuclear cataract, left eye: Secondary | ICD-10-CM | POA: Diagnosis not present

## 2023-01-29 DIAGNOSIS — H25043 Posterior subcapsular polar age-related cataract, bilateral: Secondary | ICD-10-CM | POA: Diagnosis not present

## 2023-01-29 DIAGNOSIS — H2513 Age-related nuclear cataract, bilateral: Secondary | ICD-10-CM | POA: Diagnosis not present

## 2023-02-12 NOTE — Progress Notes (Unsigned)
Cardiology Clinic Note   Patient Name: [name] Date of Encounter: 02/14/2023  Primary Care Provider:  [name], MD Primary Cardiologist:  Bryan Lemma, MD  Patient Profile    [name] 89 year old female presents the clinic today for follow-up evaluation of her hypertension and preoperative cardiac evaluation.  Past Medical History    Past Medical History:  Diagnosis Date   Aortic stenosis    Echo 07/05/16: Very mild AS, Mean gradient 10 mm Hg, Peak gradient (S) 19 mmHg    Arthritis    left hip and back   Bronchiectasis    isolated to RML; status post right middle lobe partial resection.   Complication of anesthesia    hard to wake up   Dyspnea    Gall stones    GERD (gastroesophageal reflux disease)    history    H/O osteoporosis    Hypertension    Hyperthyroidism    endocrinologist - Dr. Talmage Nap   Pneumonia    Yeast infection    Past Surgical History:  Procedure Laterality Date   ABDOMINAL HYSTERECTOMY  1974   CHOLECYSTECTOMY N/A 07/02/2016   Procedure: LAPAROSCOPIC CHOLECYSTECTOMY;  Surgeon: Almond Lint, MD;  Location: [facility] OR;  Service: General;  Laterality: N/A;   COLONOSCOPY     ENDOSCOPIC RETROGRADE CHOLANGIOPANCREATOGRAPHY (ERCP) WITH PROPOFOL N/A 08/07/2018   Procedure: ENDOSCOPIC RETROGRADE CHOLANGIOPANCREATOGRAPHY (ERCP) WITH PROPOFOL;  Surgeon: Jeani Hawking, MD;  Location: [facility]COPY;  Service: Endoscopy;  Laterality: N/A;   EYE SURGERY     stye removed   HARDWARE REMOVAL Left 02/24/2020   Procedure: Hardware Removal, left knee;  Surgeon: Eldred Manges, MD;  Location: [facility] OR;  Service: Orthopedics;  Laterality: Left;   HERNIA REPAIR  1975   KNEE SURGERY Left    LUNG REMOVAL, PARTIAL  208   RML   NM MYOVIEW LTD  06/2011   Normal EF. No ischemia or infarction.   SPHINCTEROTOMY  08/07/2018   Procedure: SPHINCTEROTOMY;  Surgeon: Jeani Hawking, MD;  Location: [facility];  Service: Endoscopy;;  balloon sweep   STERIOD INJECTION Right  02/24/2020   Procedure: RIGHT KNEE STEROID INTRA-ARTICULAR MARCAINE/DEPO-MEDROL INJECTION UNDER ANESTHESIA;  Surgeon: Eldred Manges, MD;  Location: [facility] OR;  Service: Orthopedics;  Laterality: Right;   TOTAL HIP ARTHROPLASTY Left 04/05/2017   Procedure: LEFT TOTAL HIP ARTHROPLASTY ANTERIOR APPROACH;  Surgeon: Eldred Manges, MD;  Location: [facility] OR;  Service: Orthopedics;  Laterality: Left;   TOTAL HIP ARTHROPLASTY Right 12/19/2018   TOTAL HIP ARTHROPLASTY Right 12/19/2018   Procedure: RIGHT TOTAL HIP ARTHROPLASTY-DIRECT ANTERIOR APPROACH;  Surgeon: Eldred Manges, MD;  Location: [facility] OR;  Service: Orthopedics;  Laterality: Right;   TOTAL KNEE ARTHROPLASTY Left 02/24/2020   Procedure: LEFT TOTAL KNEE ARTHROPLASTY;  Surgeon: Eldred Manges, MD;  Location: [facility] OR;  Service: Orthopedics;  Laterality: Left;   TRANSTHORACIC ECHOCARDIOGRAM  06/2011   Mild concentric LVH. Normal EF with impaired relaxation. Mildly elevated RV pressures of 30 and 40 mmHg.   TRANSTHORACIC ECHOCARDIOGRAM  06/2016   normal LV size and function. EF 60-65% with GRD 2 DD. Mild aortic stenosis. Mild LA dilation. Mild to moderately increased PA pressures (46 mmHg).   TUBAL LIGATION      Allergies  Allergies  Allergen Reactions   Aleve [Naproxen]     Other reaction(s): hallucinations   Ibuprofen Other (See Comments)    hallucinations Other reaction(s): hallucinations   Naproxen Sodium Other (See Comments)    hallucinations  History of Present Illness    [name] has a PMH of HTN, OSA on CPAP, status post partial RML lobectomy for PNA, and diastolic dysfunction.  She was initially seen 3/15 for chest pain which was musculoskeletal in nature.  Her echocardiogram and nuclear stress test in 2012 were negative for ischemia.  On 6/18 she was seen for preoperative valuation.  Her echocardiogram 9/17 showed normal EF and G2 DD with mild aortic stenosis and mildly elevated PAP.  She had planned hip surgery.  However, she was not  having significant symptoms.  She was felt to be low risk for her hip replacement surgery.  She was seen in follow-up by Bailey Mech, DNP 10/19.  She was being evaluated for right hip surgery.  She was felt to be low risk.  She did not undergo her procedure.  She was seen by Dr. Tresa Endo 3/20 for preoperative evaluation and her pulse was noted to be in the 40s.  She was seen 2/21 by Bailey Mech, DNP.  She reported having trouble with both knees.  She denied chest pain.  She noted dyspnea with climbing stairs.  She was noted to have postnasal drip.  She was treated with OTC and antihistamine medications.  She underwent total left knee arthroplasty 5/21.  She was seen in follow-up by Dr. Herbie Baltimore on 11/21/2020.  During that time she remained stable from a cardiac standpoint.  She continued to have some pain and stiffness in her left knee.  She reported that her hips were feeling somewhat better.  She was somewhat limited in her walking and was not doing that regularly.  She had gained around 5 pounds.  She was noted to have dyspnea with exertion which was felt to be related to deconditioning.  She only noted dyspnea with going up stairs.  She denied chest pain and pressure.  She would have minimal rare palpitations.  She presents to the clinic today for follow-up evaluation and states she got a second opinion after her knee replacement.  She reports that her prosthesis was not aligned correctly.  Other than her knee pain she feels very good.  She continues to be very physically active at home doing all of her work around the house and also enjoys doing light yard work.  We reviewed the importance of staying physically active and low-sodium heart healthy diet.  I will plan follow-up in 12 months.  Today she denies chest pain, shortness of breath, lower extremity edema, fatigue, palpitations, melena, hematuria, hemoptysis, diaphoresis, weakness, presyncope, syncope, orthopnea, and PND.     Home  Medications    Prior to Admission medications   Medication Sig Start Date End Date Taking? Authorizing Provider  Acetaminophen (TYLENOL PO) Take by mouth.    [provider]  busPIRone (BUSPAR) 5 MG tablet TAKE 1 TABLET(5 MG) BY MOUTH DAILY AS NEEDED FOR ANXIETY 09/24/22   [name], MD  calcium carbonate (OS-CAL) 600 MG TABS tablet Take 600 mg by mouth daily.    [provider]  cephALEXin (KEFLEX) 500 MG capsule TAKE BY MOUTH TWICE DAILY. 01/17/23   [name], MD  cholecalciferol (VITAMIN D3) 25 MCG (1000 UT) tablet Take 1,000 Units by mouth daily.    [provider]  diclofenac Sodium (VOLTAREN) 1 % GEL APPLY 2 GRAMS 4 TIMES DAILY AS DIRECTED 10/19/21   [name], MD  famotidine (PEPCID) 20 MG tablet TAKE 1 TABLET(20 MG) BY MOUTH DAILY 09/24/22   [name], MD  fenofibrate [facility])  145 MG tablet TAKE 1 TABLET BY MOUTH EVERY DAY 12/13/22   [name], MD  Flaxseed, Linseed, (FLAXSEED OIL) 1000 MG CAPS Take 1,000 mg by mouth daily.    [provider]  furosemide (LASIX) 20 MG tablet TAKE 1 TABLET BY MOUTH ONCE DAILY 09/24/22   [name], MD  lidocaine (LIDODERM) 5 % Place 1 patch onto the skin daily as needed (hip pain). 01/04/22   [name], MD  methimazole (TAPAZOLE) 5 MG tablet Take 2.5 mg by mouth daily.     [provider]  metoprolol succinate (TOPROL-XL) 25 MG 24 hr tablet Take 1 tablet (25 mg total) by mouth daily. 01/04/22   [name], MD  nystatin cream (MYCOSTATIN) Apply 1 application. topically 2 (two) times daily as needed for dry skin. APPLY CREAM TO AFFECTED AREA TWICE A DAY AS NEEDED Strength: 100,000 UNIT/GM 02/22/22   [name], MD  potassium chloride SA (KLOR-CON M) 20 MEQ tablet TAKE 1 TABLET BY MOUTH EVERY DAY 09/24/22   [name], MD    Family History    Family History  Problem Relation Age of Onset   Hypertension Mother    CVA Mother 72   Heart attack Sister    Diabetes Sister     Healthy Father    Heart attack Brother    Heart attack Brother    She indicated that her mother is deceased. She indicated that her father is deceased. She indicated that both of her sisters are deceased. She indicated that all of her three brothers are deceased. She indicated that both of her daughters are alive.  Social History    Social History   Socioeconomic History   Marital status: Widowed    Spouse name: Not on file   Number of children: Not on file   Years of education: Not on file   Highest education level: Not on file  Occupational History   Occupation: retired  Tobacco Use   Smoking status: Never   Smokeless tobacco: Never  Vaping Use   Vaping Use: Never used  Substance and Sexual Activity   Alcohol use: No   Drug use: No   Sexual activity: Not Currently  Other Topics Concern   Not on file  Social History Narrative   She is a widowed mother of 4, grandmother for, a great-grandmother 4.   She has been trying to do more exercise than before, but has been injured recently by her back pain.   She never smoked, doesn't drink alcohol.   Social Determinants of Health   Financial Resource Strain: Low Risk  (08/02/2022)   Overall Financial Resource Strain (CARDIA)    Difficulty of Paying Living Expenses: Not hard at all  Food Insecurity: No Food Insecurity (08/02/2022)   Hunger Vital Sign    Worried About Running Out of Food in the Last Year: Never true    Ran Out of Food in the Last Year: Never true  Transportation Needs: No Transportation Needs (08/02/2022)   PRAPARE - Administrator, Civil Service (Medical): No    Lack of Transportation (Non-Medical): No  Physical Activity: Inactive (08/02/2022)   Exercise Vital Sign    Days of Exercise per Week: 0 days    Minutes of Exercise per Session: 0 min  Stress: Stress Concern Present (08/02/2022)   [employer] of Occupational Health - Occupational Stress Questionnaire    Feeling of Stress : To  some extent  Social Connections: Not on file  Intimate  Partner Violence: Not on file     Review of Systems    General:  No chills, fever, night sweats or weight changes.  Cardiovascular:  No chest pain, dyspnea on exertion, edema, orthopnea, palpitations, paroxysmal nocturnal dyspnea. Dermatological: No rash, lesions/masses Respiratory: No cough, dyspnea Urologic: No hematuria, dysuria Abdominal:   No nausea, vomiting, diarrhea, bright red blood per rectum, melena, or hematemesis Neurologic:  No visual changes, wkns, changes in mental status. All other systems reviewed and are otherwise negative except as noted above.  Physical Exam    VS:  BP 112/82   Pulse (!) 56   Ht 5' (1.524 m)   Wt 198 lb 9.6 oz (90.1 kg)   SpO2 96%   BMI 38.79 kg/m  , BMI Body mass index is 38.79 kg/m. GEN: Well nourished, well developed, in no acute distress. HEENT: normal. Neck: Supple, no JVD, carotid bruits, or masses. Cardiac: RRR, no murmurs, rubs, or gallops. No clubbing, cyanosis, edema.  Radials/DP/PT 2+ and equal bilaterally.  Respiratory:  Respirations regular and unlabored, clear to auscultation bilaterally. GI: Soft, nontender, nondistended, BS + x 4. MS: no deformity or atrophy. Skin: warm and dry, no rash. Neuro:  Strength and sensation are intact. Psych: Normal affect.  Accessory Clinical Findings    Recent Labs: 05/31/2022: TSH 0.66 01/15/2023: ALT 11; BUN 15; Creatinine, Ser 0.67; Hemoglobin 12.3; Platelets 413; Potassium 4.2; Sodium 140   Recent Lipid Panel    Component Value Date/Time   CHOL 196 01/15/2023 1156   TRIG 76 01/15/2023 1156   HDL 59 01/15/2023 1156   CHOLHDL 3.3 01/15/2023 1156   LDLCALC 123 (H) 01/15/2023 1156         ECG personally reviewed by me today-sinus bradycardia with sinus arrhythmia 56 bpm- No acute changes  Echocardiogram 07/05/2016  Study Conclusions   - Left ventricle: The cavity size was normal. Systolic function was    normal. The  estimated ejection fraction was in the range of 60%    to 65%. Wall motion was normal; there were no regional wall    motion abnormalities. Features are consistent with a pseudonormal    left ventricular filling pattern, with concomitant abnormal    relaxation and increased filling pressure (grade 2 diastolic    dysfunction).  - Aortic valve: There was very mild stenosis. Valve area (VTI):    2.01 cm^2. Valve area (Vmax): 2.09 cm^2. Valve area (Vmean): 1.96    cm^2.  - Mitral valve: Calcified annulus.  - Left atrium: The atrium was mildly dilated.  - Pulmonary arteries: Systolic pressure was mildly to moderately    increased. PA peak pressure: 46 mm Hg (S).   -------------------------------------------------------------------  Study data:  Comparison was made to the study of 07/08/2012.  Study  status:  Routine.  Procedure:  The patient reported no pain pre or  post test. Transthoracic echocardiography. Image quality was good.  Study completion:  There were no complications.  Transthoracic echocardiography.  M-mode, complete 2D, spectral  Doppler, and color Doppler.  Birthdate:  Patient birthdate:  [birth year].  Age:  Patient is 89 yr old.  Sex:  Gender: female.  BMI: 39.1 kg/m^2.  Blood pressure:     136/61  Patient status:  Inpatient.  Study date:  Study date: 07/05/2016. Study time: 10:06  AM.  Location:  Bedside.   -------------------------------------------------------------------   -------------------------------------------------------------------  Left ventricle:  The cavity size was normal. Systolic function was  normal. The estimated ejection fraction was in the range of 60%  to  65%. Wall motion was normal; there were no regional wall motion  abnormalities. Features are consistent with a pseudonormal left  ventricular filling pattern, with concomitant abnormal relaxation  and increased filling pressure (grade 2 diastolic dysfunction).    -------------------------------------------------------------------  Aortic valve:   Trileaflet; mildly thickened, mildly calcified  leaflets. Mobility was not restricted.  Doppler:   There was very  mild stenosis.   There was no regurgitation.    VTI ratio of LVOT  to aortic valve: 0.7. Valve area (VTI): 2.01 cm^2. Indexed valve  area (VTI): 0.98 cm^2/m^2. Peak velocity ratio of LVOT to aortic  valve: 0.73. Valve area (Vmax): 2.09 cm^2. Indexed valve area  (Vmax): 1.01 cm^2/m^2. Mean velocity ratio of LVOT to aortic valve:  0.68. Valve area (Vmean): 1.96 cm^2. Indexed valve area (Vmean):  0.95 cm^2/m^2.    Mean gradient (S): 10 mm Hg. Peak gradient (S):  19 mm Hg.   -------------------------------------------------------------------  Aorta: Aortic root: The aortic root was normal in size.   -------------------------------------------------------------------  Mitral valve:   Calcified annulus. Mobility was not restricted.  Doppler:  Transvalvular velocity was within the normal range. There  was no evidence for stenosis. There was trivial regurgitation.  Peak gradient (D): 4 mm Hg.   -------------------------------------------------------------------  Left atrium:  The atrium was mildly dilated.   -------------------------------------------------------------------  Right ventricle:  The cavity size was normal. Wall thickness was  normal. Systolic function was normal.   -------------------------------------------------------------------  Pulmonic valve:    Structurally normal valve.   Cusp separation was  normal.  Doppler:  Transvalvular velocity was within the normal  range. There was no evidence for stenosis. There was trivial  regurgitation.   -------------------------------------------------------------------  Tricuspid valve:   Structurally normal valve.    Doppler:  Transvalvular velocity was within the normal range. There was mild  regurgitation.    -------------------------------------------------------------------  Pulmonary artery:   The main pulmonary artery was normal-sized.  Systolic pressure was mildly to moderately increased.   -------------------------------------------------------------------  Right atrium:  The atrium was normal in size.   -------------------------------------------------------------------  Pericardium: There was no pericardial effusion.   -------------------------------------------------------------------  Systemic veins:  Inferior vena cava: The vessel was dilated. The respirophasic  diameter changes were blunted (< 50%), consistent with elevated  central venous pressure.    Assessment & Plan   1.  Diastolic CHF-no increased DOE or activity intolerance.  Euvolemic.  Weight stable.  Noted to have some deconditioning due to limited mobility from left knee pain.  Echocardiogram 07/05/2016 showed normal LVEF and G2 DD. Continue current medical therapy Low-sodium diet Increase physical activity as tolerated Lower extremity support stockings  Essential hypertension-BP today 112/82 Continue current medical therapy Maintain blood pressure log Continue low-sodium diet  Cardiac murmur-previously noted around right sternal border.  Not appreciated today.  No increased dyspnea. Repeat echocardiogram when clinically indicated   Preoperative cardiac evaluation-revision left total knee arthroplasty, Dr. Gean Birchwood, [facility]  Primary Cardiologist: Bryan Lemma, MD  Chart reviewed as part of pre-operative protocol coverage. Given past medical history and time since last visit, based on ACC/AHA guidelines, Tinzlee M Shumski would be at acceptable risk for the planned procedure without further cardiovascular testing.   Her RCRI is a class II risk, 0.9% risk of major cardiac event.  She is able to complete greater than 4 METS of physical activity.  Patient was advised that if she develops new  symptoms prior to surgery to contact our office to arrange a follow-up appointment.  She verbalized understanding.  I will route this recommendation to the requesting party via Epic fax function and remove from pre-op pool.   Disposition: Follow-up with Dr. Herbie Baltimore or me in 12 months.     Thomasene Ripple. Joahan Swatzell NP-C     02/14/2023, 11:18 AM [facility] [address] Office [phone] Fax [phone]    I spent 14 minutes examining this patient, reviewing medications, and using patient centered shared decision making involving her cardiac care.  Prior to her visit I spent greater than 20 minutes reviewing her past medical history,  medications, and prior cardiac tests.

## 2023-02-13 ENCOUNTER — Ambulatory Visit: Payer: Medicare Other | Admitting: Physician Assistant

## 2023-02-14 ENCOUNTER — Ambulatory Visit: Payer: Medicare Other | Attending: Physician Assistant | Admitting: General Practice

## 2023-02-14 ENCOUNTER — Encounter: Payer: Self-pay | Admitting: General Practice

## 2023-02-14 VITALS — BP 112/82 | HR 56 | Ht 60.0 in | Wt 198.6 lb

## 2023-02-14 DIAGNOSIS — Z01810 Encounter for preprocedural cardiovascular examination: Secondary | ICD-10-CM | POA: Diagnosis not present

## 2023-02-14 DIAGNOSIS — I10 Essential (primary) hypertension: Secondary | ICD-10-CM | POA: Diagnosis not present

## 2023-02-14 DIAGNOSIS — I5032 Chronic diastolic (congestive) heart failure: Secondary | ICD-10-CM

## 2023-02-14 DIAGNOSIS — R011 Cardiac murmur, unspecified: Secondary | ICD-10-CM

## 2023-02-14 NOTE — Patient Instructions (Signed)
Medication Instructions:  The current medical regimen is effective;  continue present plan and medications as directed. Please refer to the Current Medication list given to you today.  *If you need a refill on your cardiac medications before your next appointment, please call your pharmacy*  Lab Work: NONE If you have labs (blood work) drawn today and your tests are completely normal, you will receive your results only by:  MyChart Message (if you have MyChart) OR  A paper copy in the mail If you have any lab test that is abnormal or we need to change your treatment, we will call you to review the results.  Other Instructions OK FOR UPCOMING PROCEDURE  Follow-Up: At [facility], you and your health needs are our priority.  As part of our continuing mission to provide you with exceptional heart care, we have created designated Provider Care Teams.  These Care Teams include your primary Cardiologist (physician) and Advanced Practice Providers (APPs -  Physician Assistants and Nurse Practitioners) who all work together to provide you with the care you need, when you need it.  Your next appointment:   12 month(s)  Provider:   Bryan Lemma, MD

## 2023-02-19 ENCOUNTER — Encounter: Payer: Self-pay | Admitting: Internal Medicine

## 2023-02-19 DIAGNOSIS — Z01419 Encounter for gynecological examination (general) (routine) without abnormal findings: Secondary | ICD-10-CM | POA: Diagnosis not present

## 2023-02-19 DIAGNOSIS — Z1211 Encounter for screening for malignant neoplasm of colon: Secondary | ICD-10-CM | POA: Diagnosis not present

## 2023-02-19 DIAGNOSIS — Z6836 Body mass index (BMI) 36.0-36.9, adult: Secondary | ICD-10-CM | POA: Diagnosis not present

## 2023-02-19 DIAGNOSIS — M810 Age-related osteoporosis without current pathological fracture: Secondary | ICD-10-CM | POA: Diagnosis not present

## 2023-02-19 DIAGNOSIS — Z1239 Encounter for other screening for malignant neoplasm of breast: Secondary | ICD-10-CM | POA: Diagnosis not present

## 2023-02-19 DIAGNOSIS — R32 Unspecified urinary incontinence: Secondary | ICD-10-CM | POA: Diagnosis not present

## 2023-02-21 ENCOUNTER — Other Ambulatory Visit: Payer: Self-pay | Admitting: Orthopedic Surgery

## 2023-02-22 DIAGNOSIS — R748 Abnormal levels of other serum enzymes: Secondary | ICD-10-CM | POA: Diagnosis not present

## 2023-02-22 DIAGNOSIS — E0590 Thyrotoxicosis, unspecified without thyrotoxic crisis or storm: Secondary | ICD-10-CM | POA: Diagnosis not present

## 2023-02-27 NOTE — Progress Notes (Signed)
Sent message, via epic in basket, requesting orders in epic from surgeon.  

## 2023-02-28 DIAGNOSIS — M1711 Unilateral primary osteoarthritis, right knee: Secondary | ICD-10-CM | POA: Diagnosis not present

## 2023-03-01 DIAGNOSIS — E0590 Thyrotoxicosis, unspecified without thyrotoxic crisis or storm: Secondary | ICD-10-CM | POA: Diagnosis not present

## 2023-03-01 DIAGNOSIS — E049 Nontoxic goiter, unspecified: Secondary | ICD-10-CM | POA: Diagnosis not present

## 2023-03-01 DIAGNOSIS — R748 Abnormal levels of other serum enzymes: Secondary | ICD-10-CM | POA: Diagnosis not present

## 2023-03-01 DIAGNOSIS — I10 Essential (primary) hypertension: Secondary | ICD-10-CM | POA: Diagnosis not present

## 2023-03-06 NOTE — Patient Instructions (Signed)
SURGICAL WAITING ROOM VISITATION Patients having surgery or a procedure may have no more than 2 support people in the waiting area - these visitors may rotate in the visitor waiting room.   Due to an increase in RSV and influenza rates and associated hospitalizations, children ages 35 and under may not visit patients in [facility] hospitals. If the patient needs to stay at the hospital during part of their recovery, the visitor guidelines for inpatient rooms apply.  PRE-OP VISITATION  Pre-op nurse will coordinate an appropriate time for 1 support person to accompany the patient in pre-op.  This support person may not rotate.  This visitor will be contacted when the time is appropriate for the visitor to come back in the pre-op area.  Please refer to the [facility] website for the visitor guidelines for Inpatients (after your surgery is over and you are in a regular room).  You are not required to quarantine at this time prior to your surgery. However, you must do this: Hand Hygiene often Do NOT share personal items Notify your provider if you are in close contact with someone who has COVID or you develop fever 100.4 or greater, new onset of sneezing, cough, sore throat, shortness of breath or body aches.  If you test positive for Covid or have been in contact with anyone that has tested positive in the last 10 days please notify you surgeon.    Your procedure is scheduled on:  Monday   March 18, 2023  Report to [facility] Main Entrance: [place] entrance where the [employer] is available.   Report to admitting at: 09:45    AM  +++++Call this number if you have any questions or problems the morning of surgery [phone]  Do not eat food after Midnight the night prior to your surgery/procedure.  After Midnight you may have the following liquids until   09:15 AM  DAY OF SURGERY  Clear Liquid Diet Water Black Coffee (sugar ok, NO MILK/CREAM OR CREAMERS)  Tea (sugar ok, NO  MILK/CREAM OR CREAMERS) regular and decaf                             Plain Jell-O  with no fruit (NO RED)                                           Fruit ices (not with fruit pulp, NO RED)                                     Popsicles (NO RED)                                                                  Juice: apple, WHITE grape, WHITE cranberry Sports drinks like Gatorade or Powerade (NO RED)                    The day of surgery:  Drink ONE (1) Pre-Surgery Clear Ensure at  09:15 AM the morning of surgery. Drink  in one sitting. Do not sip.  This drink was given to you during your hospital pre-op appointment visit. Nothing else to drink after completing the Pre-Surgery Clear Ensure : No candy, chewing gum or throat lozenges.    FOLLOW  ANY ADDITIONAL PRE OP INSTRUCTIONS YOU RECEIVED FROM YOUR SURGEON'S OFFICE!!!   Oral Hygiene is also important to reduce your risk of infection.        Remember - BRUSH YOUR TEETH THE MORNING OF SURGERY WITH YOUR REGULAR TOOTHPASTE  Do NOT smoke after Midnight the night before surgery.  Take ONLY these medicines the morning of surgery with A SIP OF WATER: Buspirone (Buspar), metoprolol, Methimazole (Tapazole)   If You have been diagnosed with Sleep Apnea - Bring CPAP mask and tubing day of surgery. We will provide you with a CPAP machine on the day of your surgery.                   You may not have any metal on your body including hair pins, jewelry, and body piercing  Do not wear make-up, lotions, powders, perfumes, or deodorant  Do not wear nail polish including gel and S&S, artificial / acrylic nails, or any other type of covering on natural nails including finger and toenails. If you have artificial nails, gel coating, etc., that needs to be removed by a nail salon, Please have this removed prior to surgery. Not doing so may mean that your surgery could be cancelled or delayed if the Surgeon or anesthesia staff feels like they are unable to  monitor you safely.   Do not shave 48 hours prior to surgery to avoid nicks in your skin which may contribute to postoperative infections.   Contacts, Hearing Aids, dentures or bridgework may not be worn into surgery. DENTURES WILL BE REMOVED PRIOR TO SURGERY PLEASE DO NOT APPLY "Poly grip" OR ADHESIVES!!!   Patients discharged on the day of surgery will not be allowed to drive home.  Someone NEEDS to stay with you for the first 24 hours after anesthesia.  Do not bring your home medications to the hospital. The Pharmacy will dispense medications listed on your medication list to you during your admission in the Hospital.  Special Instructions: Bring a copy of your healthcare power of attorney and living will documents the day of surgery, if you wish to have them scanned into your [facility]- EPIC  Please read over the following fact sheets you were given: IF YOU HAVE QUESTIONS ABOUT YOUR PRE-OP INSTRUCTIONS, PLEASE CALL [phone].         Pre-operative 5 CHG Bath Instructions   You can play a key role in reducing the risk of infection after surgery. Your skin needs to be as free of germs as possible. You can reduce the number of germs on your skin by washing with CHG (chlorhexidine gluconate) soap before surgery. CHG is an antiseptic soap that kills germs and continues to kill germs even after washing.   DO NOT use if you have an allergy to chlorhexidine/CHG or antibacterial soaps. If your skin becomes reddened or irritated, stop using the CHG and notify one of our RNs at [phone]  Please shower with the CHG soap starting 4 days before surgery using the following schedule: START SHOWERS ON THURSDAY  March 14, 2023  Please keep in mind the following:  DO NOT shave, including legs and  underarms, starting the day of your first shower.   You may shave your face at any point before/day of surgery.   Place clean sheets on your bed the day you start using CHG soap. Use a clean washcloth (not used since being washed) for each shower. DO NOT sleep with pets once you start using the CHG.   CHG Shower Instructions:  If you choose to wash your hair and private area, wash first with your normal shampoo/soap.  After you use shampoo/soap, rinse your hair and body thoroughly to remove shampoo/soap residue.  Turn the water OFF and apply about 3 tablespoons (45 ml) of CHG soap to a CLEAN washcloth.  Apply CHG soap ONLY FROM YOUR NECK DOWN TO YOUR TOES (washing for 3-5 minutes)  DO NOT use CHG soap on face, private areas, open wounds, or sores.  Pay special attention to the area where your surgery is being performed.  If you are having back surgery, having someone wash your back for you may be helpful.  Wait 2 minutes after CHG soap is applied, then you may rinse off the CHG soap.  Pat dry with a clean towel  Put on clean clothes/pajamas   If you choose to wear lotion, please use ONLY the CHG-compatible lotions on the back of this paper.     Additional instructions for the day of surgery: DO NOT APPLY any lotions, deodorants, cologne, or perfumes.   Put on clean/comfortable clothes.  Brush your teeth.  Ask your nurse before applying any prescription medications to the skin.      CHG Compatible Lotions   Aveeno Moisturizing lotion  Cetaphil Moisturizing Cream  Cetaphil Moisturizing Lotion  Clairol Herbal Essence Moisturizing Lotion, Dry Skin  Clairol Herbal Essence Moisturizing Lotion, Extra Dry Skin  Clairol Herbal Essence Moisturizing Lotion, Normal Skin  Curel Age Defying Therapeutic Moisturizing Lotion with Alpha Hydroxy  Curel Extreme Care Body Lotion  Curel Soothing Hands Moisturizing Hand Lotion  Curel Therapeutic Moisturizing Cream, Fragrance-Free  Curel  Therapeutic Moisturizing Lotion, Fragrance-Free  Curel Therapeutic Moisturizing Lotion, Original Formula  Eucerin Daily Replenishing Lotion  Eucerin Dry Skin Therapy Plus Alpha Hydroxy Crme  Eucerin Dry Skin Therapy Plus Alpha Hydroxy Lotion  Eucerin Original Crme  Eucerin Original Lotion  Eucerin Plus Crme Eucerin Plus Lotion  Eucerin TriLipid Replenishing Lotion  Keri Anti-Bacterial Hand Lotion  Keri Deep Conditioning Original Lotion Dry Skin Formula Softly Scented  Keri Deep Conditioning Original Lotion, Fragrance Free Sensitive Skin Formula  Keri Lotion Fast Absorbing Fragrance Free Sensitive Skin Formula  Keri Lotion Fast Absorbing Softly Scented Dry Skin Formula  Keri Original Lotion  Keri Skin Renewal Lotion Keri Silky Smooth Lotion  Keri Silky Smooth Sensitive Skin Lotion  Nivea Body Creamy Conditioning Oil  Nivea Body Extra Enriched Lotion  Nivea Body Original Lotion  Nivea Body Sheer Moisturizing Lotion Nivea Crme  Nivea Skin Firming Lotion  NutraDerm 30 Skin Lotion  NutraDerm Skin Lotion  NutraDerm Therapeutic Skin Cream  NutraDerm Therapeutic Skin Lotion  ProShield Protective Hand Cream  Provon moisturizing lotion  ON THE DAY OF SURGERY : Do not apply any lotions/deodorants the morning of surgery.  Please wear clean clothes to the hospital/surgery center.    FAILURE TO FOLLOW THESE INSTRUCTIONS MAY RESULT IN THE CANCELLATION OF YOUR SURGERY  PATIENT SIGNATURE_________________________________  NURSE SIGNATURE__________________________________  ________________________________________________________________________        Rogelia Mire    An incentive  spirometer is a tool that can help keep your lungs clear and active. This tool measures how well you are filling your lungs with each breath. Taking long deep breaths may help reverse or decrease the chance of developing breathing (pulmonary) problems (especially infection) following: A long  period of time when you are unable to move or be active. BEFORE THE PROCEDURE  If the spirometer includes an indicator to show your best effort, your nurse or respiratory therapist will set it to a desired goal. If possible, sit up straight or lean slightly forward. Try not to slouch. Hold the incentive spirometer in an upright position. INSTRUCTIONS FOR USE  Sit on the edge of your bed if possible, or sit up as far as you can in bed or on a chair. Hold the incentive spirometer in an upright position. Breathe out normally. Place the mouthpiece in your mouth and seal your lips tightly around it. Breathe in slowly and as deeply as possible, raising the piston or the ball toward the top of the column. Hold your breath for 3-5 seconds or for as long as possible. Allow the piston or ball to fall to the bottom of the column. Remove the mouthpiece from your mouth and breathe out normally. Rest for a few seconds and repeat Steps 1 through 7 at least 10 times every 1-2 hours when you are awake. Take your time and take a few normal breaths between deep breaths. The spirometer may include an indicator to show your best effort. Use the indicator as a goal to work toward during each repetition. After each set of 10 deep breaths, practice coughing to be sure your lungs are clear. If you have an incision (the cut made at the time of surgery), support your incision when coughing by placing a pillow or rolled up towels firmly against it. Once you are able to get out of bed, walk around indoors and cough well. You may stop using the incentive spirometer when instructed by your caregiver.  RISKS AND COMPLICATIONS Take your time so you do not get dizzy or light-headed. If you are in pain, you may need to take or ask for pain medication before doing incentive spirometry. It is harder to take a deep breath if you are having pain. AFTER USE Rest and breathe slowly and easily. It can be helpful to keep track of a log  of your progress. Your caregiver can provide you with a simple table to help with this. If you are using the spirometer at home, follow these instructions: SEEK MEDICAL CARE IF:  You are having difficultly using the spirometer. You have trouble using the spirometer as often as instructed. Your pain medication is not giving enough relief while using the spirometer. You develop fever of 100.5 F (38.1 C) or higher.                                                                                                    SEEK IMMEDIATE MEDICAL CARE IF:  You cough up bloody sputum that had not been present before. You develop fever of  102 F (38.9 C) or greater. You develop worsening pain at or near the incision site. MAKE SURE YOU:  Understand these instructions. Will watch your condition. Will get help right away if you are not doing well or get worse. Document Released: 02/04/2007 Document Revised: 12/17/2011 Document Reviewed: 04/07/2007 [facility] Patient Information 2014 [place], [place].      WHAT IS A BLOOD TRANSFUSION? Blood Transfusion Information  A transfusion is the replacement of blood or some of its parts. Blood is made up of multiple cells which provide different functions. Red blood cells carry oxygen and are used for blood loss replacement. White blood cells fight against infection. Platelets control bleeding. Plasma helps clot blood. Other blood products are available for specialized needs, such as hemophilia or other clotting disorders. BEFORE THE TRANSFUSION  Who gives blood for transfusions?  Healthy volunteers who are fully evaluated to make sure their blood is safe. This is blood bank blood. Transfusion therapy is the safest it has ever been in the practice of medicine. Before blood is taken from a donor, a complete history is taken to make sure that person has no history of diseases nor engages in risky social behavior (examples are intravenous drug use or sexual activity  with multiple partners). The donor's travel history is screened to minimize risk of transmitting infections, such as malaria. The donated blood is tested for signs of infectious diseases, such as HIV and hepatitis. The blood is then tested to be sure it is compatible with you in order to minimize the chance of a transfusion reaction. If you or a relative donates blood, this is often done in anticipation of surgery and is not appropriate for emergency situations. It takes many days to process the donated blood. RISKS AND COMPLICATIONS Although transfusion therapy is very safe and saves many lives, the main dangers of transfusion include:  Getting an infectious disease. Developing a transfusion reaction. This is an allergic reaction to something in the blood you were given. Every precaution is taken to prevent this. The decision to have a blood transfusion has been considered carefully by your caregiver before blood is given. Blood is not given unless the benefits outweigh the risks. AFTER THE TRANSFUSION Right after receiving a blood transfusion, you will usually feel much better and more energetic. This is especially true if your red blood cells have gotten low (anemic). The transfusion raises the level of the red blood cells which carry oxygen, and this usually causes an energy increase. The nurse administering the transfusion will monitor you carefully for complications. HOME CARE INSTRUCTIONS  No special instructions are needed after a transfusion. You may find your energy is better. Speak with your caregiver about any limitations on activity for underlying diseases you may have. SEEK MEDICAL CARE IF:  Your condition is not improving after your transfusion. You develop redness or irritation at the intravenous (IV) site. SEEK IMMEDIATE MEDICAL CARE IF:  Any of the following symptoms occur over the next 12 hours: Shaking chills. You have a temperature by mouth above 102 F (38.9 C), not controlled  by medicine. Chest, back, or muscle pain. People around you feel you are not acting correctly or are confused. Shortness of breath or difficulty breathing. Dizziness and fainting. You get a rash or develop hives. You have a decrease in urine output. Your urine turns a dark color or changes to pink, red, or brown. Any of the following symptoms occur over the next 10 days: You have a temperature by mouth above  102 F (38.9 C), not controlled by medicine. Shortness of breath. Weakness after normal activity. The white part of the eye turns yellow (jaundice). You have a decrease in the amount of urine or are urinating less often. Your urine turns a dark color or changes to pink, red, or brown. Document Released: 09/21/2000 Document Revised: 12/17/2011 Document Reviewed: 05/10/2008 [facility] Patient Information 2014 [place], [place].       _______________________________________________________________________

## 2023-03-06 NOTE — Progress Notes (Addendum)
COVID Vaccine received:  []  No [x]  Yes Date of any COVID positive Test in last 60 days:None  PCP - [name], MD  clearance in 01-15-23  Note Cardiologist - Bryan Lemma, MD,  Edd Fabian, NP  Cardiac clearance in 02-14-23 note Endocrinology-  Dorisann Frames, MD  Chest x-ray - (02-19-2020 Epic)  MD ordered repeat at PST EKG -  02-14-2023   Epic Stress Test -  ECHO - 2017   Mild AS    Epic Cardiac Cath -   PCR screen: [x]  Ordered & Completed           []   No Order but Needs PROFEND           []   N/A for this surgery  Surgery Plan:  [x]  Ambulatory                            []  Outpatient in bed                            []  Admit  Anesthesia:    []  General  [x]  Spinal                           []   Choice []   MAC  Pacemaker / ICD device [x]  No []  Yes   Spinal Cord Stimulator:[x]  No []  Yes       History of Sleep Apnea? [x]  No []  Yes   CPAP used?- [x]  No []  Yes    Does the patient monitor blood sugar?          []  No []  Yes  [x]  N/A  Patient has: [x]  NO Hx DM   []  Pre-DM                 []  DM1  []   DM2  Blood Thinner / Instructions:  none  Aspirin Instructions:  ASA 81 mg    Hold x 1 week, patient is aware  ERAS Protocol Ordered: []  No  [x]  Yes PRE-SURGERY [x]  ENSURE  []  G2  Patient is to be NPO after: 09:15  Patient was given the 5 CHG shower / bath instructions for THA / TKA / Total or Reverse Shoulder arthroplasty surgery along with 2 bottles of the CHG soap. Patient will start this on: THursday  June6, 2024  All questions were asked and answered, Patient voiced understanding of this process.   Activity level: Patient is unable to climb a flight of stairs without difficulty; [x]  No CP  but would have SOB and Leg pain.  Patient can perform ADLs without assistance.   Anesthesia review: HTN, Anxiety, Diastolic CHF, s/p Right middle lobectomy (Lung 2010), CKD2, PVD Claudication, "Hard to wake up",  Hx murmur- not heard at cardiac preop exam 02-14-2023.  Patient denies  shortness of breath, fever, cough and chest pain at PAT appointment.  Patient verbalized understanding and agreement to the Pre-Surgical Instructions that were given to them at this PAT appointment. Patient was also educated of the need to review these PAT instructions again prior to her surgery.I reviewed the appropriate phone numbers to call if they have any and questions or concerns.

## 2023-03-07 ENCOUNTER — Encounter (HOSPITAL_COMMUNITY): Payer: Self-pay

## 2023-03-07 ENCOUNTER — Encounter (HOSPITAL_COMMUNITY)
Admission: RE | Admit: 2023-03-07 | Discharge: 2023-03-07 | Disposition: A | Discharge status: Home / Self Care | Payer: Medicare Other | Source: Ambulatory Visit | Attending: Orthopedic Surgery | Admitting: Orthopedic Surgery

## 2023-03-07 ENCOUNTER — Ambulatory Visit (HOSPITAL_COMMUNITY)
Admission: RE | Admit: 2023-03-07 | Discharge: 2023-03-07 | Disposition: A | Discharge status: Home / Self Care | Payer: Medicare Other | Source: Ambulatory Visit | Attending: Orthopedic Surgery | Admitting: Orthopedic Surgery

## 2023-03-07 ENCOUNTER — Other Ambulatory Visit: Payer: Self-pay | Admitting: Internal Medicine

## 2023-03-07 ENCOUNTER — Other Ambulatory Visit: Payer: Self-pay

## 2023-03-07 VITALS — BP 104/65 | HR 46 | Temp 98.0°F | Resp 16 | Ht 60.0 in | Wt 187.0 lb

## 2023-03-07 DIAGNOSIS — Z01818 Encounter for other preprocedural examination: Secondary | ICD-10-CM | POA: Insufficient documentation

## 2023-03-07 DIAGNOSIS — I5032 Chronic diastolic (congestive) heart failure: Secondary | ICD-10-CM

## 2023-03-07 DIAGNOSIS — T84033A Mechanical loosening of internal left knee prosthetic joint, initial encounter: Principal | ICD-10-CM | POA: Diagnosis present

## 2023-03-07 DIAGNOSIS — I10 Essential (primary) hypertension: Secondary | ICD-10-CM | POA: Insufficient documentation

## 2023-03-07 HISTORY — DX: Cardiac murmur, unspecified: R01.1

## 2023-03-07 HISTORY — DX: Chronic kidney disease, unspecified: N18.9

## 2023-03-07 HISTORY — DX: Heart failure, unspecified: I50.9

## 2023-03-07 HISTORY — DX: Anxiety disorder, unspecified: F41.9

## 2023-03-07 LAB — BASIC METABOLIC PANEL
Anion gap: 9 (ref 5–15)
BUN: 19 mg/dL (ref 8–23)
CO2: 26 mmol/L (ref 22–32)
Calcium: 9 mg/dL (ref 8.9–10.3)
Chloride: 101 mmol/L (ref 98–111)
Creatinine, Ser: 0.65 mg/dL (ref 0.44–1.00)
GFR, Estimated: >60 mL/min (ref >60)
Glucose, Bld: 88 mg/dL (ref 70–99)
Potassium: 4.6 mmol/L (ref 3.5–5.1)
Sodium: 136 mmol/L (ref 135–145)

## 2023-03-07 LAB — CBC
HCT: 37.3 % (ref 36.0–46.0)
Hemoglobin: 12.8 g/dL (ref 12.0–15.0)
MCH: 27.4 pg (ref 26.0–34.0)
MCHC: 34.3 g/dL (ref 30.0–36.0)
MCV: 79.7 fL — ABNORMAL LOW (ref 80.0–100.0)
Platelets: 475 10*3/uL — ABNORMAL HIGH (ref 150–400)
RBC: 4.68 MIL/uL (ref 3.87–5.11)
RDW: 14.8 % (ref 11.5–15.5)
WBC: 7.1 10*3/uL (ref 4.0–10.5)
nRBC: 0 % (ref 0.0–0.2)

## 2023-03-07 LAB — TYPE AND SCREEN
ABO/RH(D): A POS
Antibody Screen: NEGATIVE

## 2023-03-07 LAB — SURGICAL PCR SCREEN
MRSA, PCR: NEGATIVE
Staphylococcus aureus: NEGATIVE

## 2023-03-07 NOTE — H&P (Signed)
TOTAL KNEE REVISION ADMISSION H&P  Patient is being admitted for left revision total knee arthroplasty.  Subjective:  Chief Complaint:left knee pain.  HPI: [name], 89 y.o. female, has a history of pain and functional disability in the left knee(s) due to failed previous arthroplasty and patient has failed non-surgical conservative treatments for greater than 12 weeks to include NSAID's and/or analgesics, flexibility and strengthening excercises, supervised PT with diminished ADL's post treatment, use of assistive devices, weight reduction as appropriate, and activity modification. The indications for the revision of the total knee arthroplasty are loosening of one or more components. Onset of symptoms was gradual starting 1 years ago with gradually worsening course since that time.  Prior procedures on the left knee(s) include arthroplasty.  Patient currently rates pain in the left knee(s) at 10 out of 10 with activity. There is night pain, worsening of pain with activity and weight bearing, pain that interferes with activities of daily living, pain with passive range of motion, crepitus, and joint swelling.  Patient has evidence of prosthetic loosening by imaging studies. This condition presents safety issues increasing the risk of falls.    There is no current active infection.  Patient Active Problem List   Diagnosis Date Noted   Loose left total knee arthroplasty (HCC) 03/07/2023   Trochanteric bursitis, left hip 07/03/2022   Dyspepsia 01/01/2021   Atherosclerosis of aorta (HCC) 01/01/2021   Diastolic CHF, chronic (HCC) 12/18/2020   Quadriceps weakness 12/06/2020   S/P total knee arthroplasty, left 03/09/2020   History of total hip arthroplasty, right 08/21/2019   Intermittent claudication (HCC) 07/08/2019   Hypertensive nephropathy 12/15/2018   Chronic renal disease, stage II 12/15/2018   H/O total hip arthroplasty, left 11/07/2017   Presence of left artificial hip joint  05/14/2017   Preop cardiovascular exam 03/12/2017   Cough    Abdominal pain    Symptomatic cholelithiasis 07/01/2016   Nonspecific ST-T wave electrocardiographic changes    Lower leg edema 02/16/2015   Obesity (BMI 30.0-34.9) 02/16/2015   Essential hypertension 01/11/2012   Hyperthyroidism 01/11/2012   Hypercholesteremia 01/11/2012   H/O vaginal hysterectomy 1974 01/11/2012   S/P removal of lung  partial R lobe  2010 01/11/2012   History of hernia repair  R ing. 1975 01/11/2012   BACK PAIN 11/12/2007   DYSPNEA 11/11/2007   Past Medical History:  Diagnosis Date   Anxiety    Aortic stenosis    Echo 07/05/16: Very mild AS, Mean gradient 10 mm Hg, Peak gradient (S) 19 mmHg    Arthritis    left hip and back   Bronchiectasis    isolated to RML; status post right middle lobe partial resection.   CHF (congestive heart failure) (HCC)    Chronic kidney disease    Complication of anesthesia    hard to wake up   Dyspnea    Gall stones    GERD (gastroesophageal reflux disease)    history    H/O osteoporosis    Heart murmur    hasn't been heard lately   Hypertension    Hyperthyroidism    endocrinologist - Dr. Talmage Nap   Pneumonia    Yeast infection     Past Surgical History:  Procedure Laterality Date   ABDOMINAL HYSTERECTOMY  1974   CHOLECYSTECTOMY N/A 07/02/2016   Procedure: LAPAROSCOPIC CHOLECYSTECTOMY;  Surgeon: Almond Lint, MD;  Location: [facility];  Service: General;  Laterality: N/A;   COLONOSCOPY     ENDOSCOPIC RETROGRADE CHOLANGIOPANCREATOGRAPHY (ERCP) WITH PROPOFOL  N/A 08/07/2018   Procedure: ENDOSCOPIC RETROGRADE CHOLANGIOPANCREATOGRAPHY (ERCP) WITH PROPOFOL;  Surgeon: Jeani Hawking, MD;  Location: [facility];  Service: Endoscopy;  Laterality: N/A;   EYE SURGERY     stye lanced   HARDWARE REMOVAL Left 02/24/2020   Procedure: Hardware Removal, left knee;  Surgeon: Eldred Manges, MD;  Location: [facility] OR;  Service: Orthopedics;  Laterality: Left;   HERNIA REPAIR  1975   KNEE  SURGERY Left    LUNG REMOVAL, PARTIAL  11/2006   RML   NM MYOVIEW LTD  06/2011   Normal EF. No ischemia or infarction.   SPHINCTEROTOMY  08/07/2018   Procedure: SPHINCTEROTOMY;  Surgeon: Jeani Hawking, MD;  Location: [facility];  Service: Endoscopy;;  balloon sweep   STERIOD INJECTION Right 02/24/2020   Procedure: RIGHT KNEE STEROID INTRA-ARTICULAR MARCAINE/DEPO-MEDROL INJECTION UNDER ANESTHESIA;  Surgeon: Eldred Manges, MD;  Location: [facility];  Service: Orthopedics;  Laterality: Right;   TOTAL HIP ARTHROPLASTY Left 04/05/2017   Procedure: LEFT TOTAL HIP ARTHROPLASTY ANTERIOR APPROACH;  Surgeon: Eldred Manges, MD;  Location: [facility];  Service: Orthopedics;  Laterality: Left;   TOTAL HIP ARTHROPLASTY Right 12/19/2018   Procedure: RIGHT TOTAL HIP ARTHROPLASTY-DIRECT ANTERIOR APPROACH;  Surgeon: Eldred Manges, MD;  Location: [facility];  Service: Orthopedics;  Laterality: Right;   TOTAL KNEE ARTHROPLASTY Left 02/24/2020   Procedure: LEFT TOTAL KNEE ARTHROPLASTY;  Surgeon: Eldred Manges, MD;  Location: [facility];  Service: Orthopedics;  Laterality: Left;   TRANSTHORACIC ECHOCARDIOGRAM  06/2011   Mild concentric LVH. Normal EF with impaired relaxation. Mildly elevated RV pressures of 30 and 40 mmHg.   TRANSTHORACIC ECHOCARDIOGRAM  06/2016   normal LV size and function. EF 60-65% with GRD 2 DD. Mild aortic stenosis. Mild LA dilation. Mild to moderately increased PA pressures (46 mmHg).   TUBAL LIGATION      No current facility-administered medications for this encounter.   Current Outpatient Medications  Medication Sig Dispense Refill Last Dose   acetaminophen (TYLENOL) 650 MG CR tablet Take 1,300 mg by mouth every 8 (eight) hours as needed for pain.      Ascorbic Acid (VITAMIN C) 500 MG CAPS Take 500 mg by mouth daily.      aspirin EC 81 MG tablet Take 81 mg by mouth daily. Swallow whole.      busPIRone (BUSPAR) 5 MG tablet TAKE 1 TABLET(5 MG) BY MOUTH DAILY AS NEEDED FOR ANXIETY 90 tablet 1    calcium  carbonate (OS-CAL) 600 MG TABS tablet Take 600 mg by mouth daily.      cholecalciferol (VITAMIN D3) 25 MCG (1000 UT) tablet Take 1,000 Units by mouth daily.      fenofibrate (TRICOR) 145 MG tablet TAKE 1 TABLET BY MOUTH EVERY DAY 90 tablet 3    Flaxseed, Linseed, (FLAXSEED OIL) 1200 MG CAPS Take 1,200 mg by mouth daily.      furosemide (LASIX) 20 MG tablet TAKE 1 TABLET BY MOUTH ONCE DAILY 90 tablet 3    lidocaine (LIDODERM) 5 % Place 1 patch onto the skin daily as needed (hip pain). 90 patch 1    methimazole (TAPAZOLE) 5 MG tablet Take 2.5 mg by mouth daily.       metoprolol succinate (TOPROL-XL) 25 MG 24 hr tablet Take 1 tablet (25 mg total) by mouth daily. 90 tablet 3    nystatin cream (MYCOSTATIN) Apply 1 application. topically 2 (two) times daily as needed for dry skin. APPLY CREAM TO AFFECTED AREA TWICE A  DAY AS NEEDED Strength: 100,000 UNIT/GM 30 g 1    potassium chloride SA (KLOR-CON M) 20 MEQ tablet TAKE 1 TABLET BY MOUTH EVERY DAY 90 tablet 3    diclofenac Sodium (VOLTAREN) 1 % GEL APPLY 2 GRAMS 4 TIMES DAILY AS DIRECTED (Patient not taking: Reported on 03/05/2023) 500 g 1 Not Taking   famotidine (PEPCID) 20 MG tablet TAKE 1 TABLET(20 MG) BY MOUTH DAILY (Patient not taking: Reported on 03/05/2023) 90 tablet 1 Not Taking   Allergies  Allergen Reactions   Aleve [Naproxen]     Other reaction(s): hallucinations   Ibuprofen Other (See Comments)    hallucinations Other reaction(s): hallucinations   Naproxen Sodium Other (See Comments)    hallucinations    Social History   Tobacco Use   Smoking status: Never   Smokeless tobacco: Never  Substance Use Topics   Alcohol use: No    Family History  Problem Relation Age of Onset   Hypertension Mother    CVA Mother 62   Heart attack Sister    Diabetes Sister    Healthy Father    Heart attack Brother    Heart attack Brother       Review of Systems  Constitutional: Negative.   HENT: Negative.    Eyes: Negative.   Respiratory:  Negative.    Cardiovascular: Negative.   Gastrointestinal: Negative.   Endocrine: Negative.   Genitourinary: Negative.   Musculoskeletal:  Positive for arthralgias.  Allergic/Immunologic: Negative.   Neurological: Negative.   Hematological: Negative.   Psychiatric/Behavioral:  The patient is nervous/anxious.      Objective:  Physical Exam Constitutional:      Appearance: Normal appearance. She is normal weight.  HENT:     Head: Normocephalic and atraumatic.     Nose: Nose normal.     Mouth/Throat:     Mouth: Mucous membranes are moist.  Eyes:     Pupils: Pupils are equal, round, and reactive to light.  Cardiovascular:     Pulses: Normal pulses.  Pulmonary:     Effort: Pulmonary effort is normal.  Musculoskeletal:        General: Tenderness and deformity present.     Cervical back: Normal range of motion and neck supple.     Comments: 15 varus deformity to the left knee range of motion is 10/110.  Surgical scar is well healed no effusion 1+ laxity to valgus stress with a good endpoint.  Good quadriceps and hamstring power.  Neurovascular intact distally.  Toes are pink and well perfused.  No pain with internal or external rotation of the hip to 30.  Skin:    General: Skin is warm and dry.  Neurological:     General: No focal deficit present.     Mental Status: She is alert and oriented to person, place, and time. Mental status is at baseline.  Psychiatric:        Mood and Affect: Mood normal.        Behavior: Behavior normal.        Thought Content: Thought content normal.        Judgment: Judgment normal.     Vital signs in last 24 hours: Temp:  [98 F (36.7 C)] 98 F (36.7 C) (05/30 1030) Pulse Rate:  [46] 46 (05/30 1030) Resp:  [16] 16 (05/30 1030) BP: (104)/(65) 104/65 (05/30 1030) SpO2:  [100 %] 100 % (05/30 1030) Weight:  [84.8 kg] 84.8 kg (05/30 1030)  Labs:  Estimated body mass index  is 36.52 kg/m as calculated from the following:   Height as of  03/07/23: 5' (1.524 m).   Weight as of 03/07/23: 84.8 kg.  Imaging Review Plain radiographs demonstrate  AP lateral and sunrise x-rays show that the tibial prosthesis is subsided significantly into 18 of varus.  I was able to call up the preoperative and postoperative films on the PACS system and preoperatively she had a relatively neutral alignment of the femur and tibia postoperatively she was an 8 of varus and the tibial implant was undersized almost 8 mm medially.    Assessment/Plan:  End stage arthritis, left knee(s) with failed previous arthroplasty.   The patient history, physical examination, clinical judgment of the provider and imaging studies are consistent with end stage degenerative joint disease of the left knee(s), previous total knee arthroplasty. Revision total knee arthroplasty is deemed medically necessary. The treatment options including medical management, injection therapy, arthroscopy and revision arthroplasty were discussed at length. The risks and benefits of revision total knee arthroplasty were presented and reviewed. The risks due to aseptic loosening, infection, stiffness, patella tracking problems, thromboembolic complications and other imponderables were discussed. The patient acknowledged the explanation, agreed to proceed with the plan and consent was signed. Patient is being admitted for inpatient treatment for surgery, pain control, PT, OT, prophylactic antibiotics, VTE prophylaxis, progressive ambulation and ADL's and discharge planning.The patient is planning to be discharged home with home health services

## 2023-03-11 ENCOUNTER — Encounter (HOSPITAL_COMMUNITY): Payer: Self-pay

## 2023-03-11 NOTE — Progress Notes (Signed)
Case: [id number] Date/Time: 03/18/23 1200   Procedure: LEFT TOTAL KNEE REVISION (Left: Knee)   Anesthesia type: Spinal   Pre-op diagnosis: LOOSE LEFT KNEE REPLACEMENT   [facility]: [facility] / [facility]   Surgeons: Gean Birchwood, MD       DISCUSSION: [name] is an 89 year old female who presents to PAT prior to surgery listed above.  Patient with history of bronchiectasis status post right middle lobe partial resection (2008), hypertension, GERD, mild aortic stenosis, CHF, thyroid disease.  Complications from anesthesia includes prolonged emergence.  Patient follows with cardiology for hypertension, mild aortic stenosis, CHF.  She was last seen in clinic on 02/14/2023.  Patient was noted to be doing well with good functional status. Murmur was not appreciated on exam and echo was recommended to be repeated when clinically indicated. Preoperative risk assessment and clearance was provided:  "Chart reviewed as part of pre-operative protocol coverage. Given past medical history and time since last visit, based on ACC/AHA guidelines, [name] would be at acceptable risk for the planned procedure without further cardiovascular testing.  Her RCRI is a class II risk, 0.9% risk of major cardiac event.  She is able to complete greater than 4 METS of physical activity. Patient was advised that if she develops new symptoms prior to surgery to contact our office to arrange a follow-up appointment.  She verbalized understanding."  Patient follows with her PCP for other chronic medical issues.  Last seen 01/15/2023.  Blood pressure was noted to be well-controlled.  She has bradycardia at baseline due to metoprolol. There is mention of a diagnosis of chronic kidney disease however her creatinine is 0.65.   Last pulmonology evaluation was on 01/03/16 with Chilton Greathouse, MD. she was not having any new respiratory symptoms. Previous work-up did not show significant lung or cardiac impairments.   Anticipate  patient can proceed barring any acute change.   VS: BP 104/65 Comment: right arm sitting  Pulse (!) 46 Comment: known Bradycardia  Temp 36.7 C (Oral)   Resp 16   Ht 5' (1.524 m)   Wt 84.8 kg   SpO2 100%   BMI 36.52 kg/m   PROVIDERS: PCP - [name], MD  Cardiologist - Bryan Lemma, MD,  Edd Fabian, NP  Cardiac clearance in 02-14-23 note Endocrinology-  Dorisann Frames, MD   LABS: Labs reviewed: Acceptable for surgery. (all labs ordered are listed, but only abnormal results are displayed)  Labs Reviewed  CBC - Abnormal; Notable for the following components:      Result Value   MCV 79.7 (*)    Platelets 475 (*)    All other components within normal limits  SURGICAL PCR SCREEN  BASIC METABOLIC PANEL  TYPE AND SCREEN     IMAGES:  CXR 02/18/20:  IMPRESSION: Minimal linear atelectasis and/or scarring within the right mid lung.   Otherwise stable examination as compared to 07/03/2016. Unchanged elevation of the right hemidiaphragm with postoperative changes to the perihilar right lung.  EKG 02/14/23:  Sinus bradycardia (rate 56)   CV:  Echo 07/06/2016:  Study Conclusions   - Left ventricle: The cavity size was normal. Systolic function was    normal. The estimated ejection fraction was in the range of 60%    to 65%. Wall motion was normal; there were no regional wall    motion abnormalities. Features are consistent with a pseudonormal    left ventricular filling pattern, with concomitant abnormal    relaxation and increased filling pressure (  grade 2 diastolic    dysfunction).  - Aortic valve: There was very mild stenosis. Valve area (VTI):    2.01 cm^2. Valve area (Vmax): 2.09 cm^2. Valve area (Vmean): 1.96    cm^2.  - Mitral valve: Calcified annulus.  - Left atrium: The atrium was mildly dilated.  - Pulmonary arteries: Systolic pressure was mildly to moderately    increased. PA peak pressure: 46 mm Hg (S).   Nuclear stress test 12/22/08:  Normal  myocardial perfusion study demonstrating an attenuation artifact in the anterior region of the myocardium.  No ischemia or infarct/scar is seen in the remaining myocardium..  No significant ischemia demonstrated.  Post EF 83%.  This is a low risk scan  Past Medical History:  Diagnosis Date   Anxiety    Aortic stenosis    Echo 07/05/16: Very mild AS, Mean gradient 10 mm Hg, Peak gradient (S) 19 mmHg    Arthritis    left hip and back   Bronchiectasis    isolated to RML; status post right middle lobe partial resection.   CHF (congestive heart failure) (HCC)    Chronic kidney disease    Complication of anesthesia    hard to wake up   Dyspnea    Gall stones    GERD (gastroesophageal reflux disease)    history    H/O osteoporosis    Heart murmur    hasn't been heard lately   Hypertension    Hyperthyroidism    endocrinologist - Dr. Talmage Nap   Pneumonia    Yeast infection     Past Surgical History:  Procedure Laterality Date   ABDOMINAL HYSTERECTOMY  1974   CHOLECYSTECTOMY N/A 07/02/2016   Procedure: LAPAROSCOPIC CHOLECYSTECTOMY;  Surgeon: Almond Lint, MD;  Location: [facility];  Service: General;  Laterality: N/A;   COLONOSCOPY     ENDOSCOPIC RETROGRADE CHOLANGIOPANCREATOGRAPHY (ERCP) WITH PROPOFOL N/A 08/07/2018   Procedure: ENDOSCOPIC RETROGRADE CHOLANGIOPANCREATOGRAPHY (ERCP) WITH PROPOFOL;  Surgeon: Jeani Hawking, MD;  Location: [facility];  Service: Endoscopy;  Laterality: N/A;   EYE SURGERY     stye lanced   HARDWARE REMOVAL Left 02/24/2020   Procedure: Hardware Removal, left knee;  Surgeon: Eldred Manges, MD;  Location: [facility];  Service: Orthopedics;  Laterality: Left;   HERNIA REPAIR  1975   KNEE SURGERY Left    LUNG REMOVAL, PARTIAL  11/2006   RML   NM MYOVIEW LTD  06/2011   Normal EF. No ischemia or infarction.   SPHINCTEROTOMY  08/07/2018   Procedure: SPHINCTEROTOMY;  Surgeon: Jeani Hawking, MD;  Location: [facility];  Service: Endoscopy;;  balloon sweep   STERIOD  INJECTION Right 02/24/2020   Procedure: RIGHT KNEE STEROID INTRA-ARTICULAR MARCAINE/DEPO-MEDROL INJECTION UNDER ANESTHESIA;  Surgeon: Eldred Manges, MD;  Location: [facility];  Service: Orthopedics;  Laterality: Right;   TOTAL HIP ARTHROPLASTY Left 04/05/2017   Procedure: LEFT TOTAL HIP ARTHROPLASTY ANTERIOR APPROACH;  Surgeon: Eldred Manges, MD;  Location: [facility];  Service: Orthopedics;  Laterality: Left;   TOTAL HIP ARTHROPLASTY Right 12/19/2018   Procedure: RIGHT TOTAL HIP ARTHROPLASTY-DIRECT ANTERIOR APPROACH;  Surgeon: Eldred Manges, MD;  Location: [facility];  Service: Orthopedics;  Laterality: Right;   TOTAL KNEE ARTHROPLASTY Left 02/24/2020   Procedure: LEFT TOTAL KNEE ARTHROPLASTY;  Surgeon: Eldred Manges, MD;  Location: [facility];  Service: Orthopedics;  Laterality: Left;   TRANSTHORACIC ECHOCARDIOGRAM  06/2011   Mild concentric LVH. Normal EF with impaired relaxation. Mildly elevated RV pressures of 30 and 40  mmHg.   TRANSTHORACIC ECHOCARDIOGRAM  06/2016   normal LV size and function. EF 60-65% with GRD 2 DD. Mild aortic stenosis. Mild LA dilation. Mild to moderately increased PA pressures (46 mmHg).   TUBAL LIGATION      MEDICATIONS:  acetaminophen (TYLENOL) 650 MG CR tablet   Ascorbic Acid (VITAMIN C) 500 MG CAPS   aspirin EC 81 MG tablet   busPIRone (BUSPAR) 5 MG tablet   calcium carbonate (OS-CAL) 600 MG TABS tablet   cholecalciferol (VITAMIN D3) 25 MCG (1000 UT) tablet   diclofenac Sodium (VOLTAREN) 1 % GEL   famotidine (PEPCID) 20 MG tablet   fenofibrate (TRICOR) 145 MG tablet   Flaxseed, Linseed, (FLAXSEED OIL) 1200 MG CAPS   furosemide (LASIX) 20 MG tablet   lidocaine (LIDODERM) 5 %   methimazole (TAPAZOLE) 5 MG tablet   metoprolol succinate (TOPROL-XL) 25 MG 24 hr tablet   nystatin cream (MYCOSTATIN)   potassium chloride SA (KLOR-CON M) 20 MEQ tablet   No current facility-administered medications for this encounter.   Marcille Blanco [facility] Short  Stay/Anesthesiology [facility] Phone [phone] 03/11/2023 9:52 AM

## 2023-03-11 NOTE — Anesthesia Preprocedure Evaluation (Addendum)
Anesthesia Evaluation  Patient identified by MRN, date of birth, ID band Patient awake    Reviewed: Allergy & Precautions, NPO status , Patient's Chart, lab work & pertinent test results, reviewed documented beta blocker date and time   History of Anesthesia Complications Negative for: history of anesthetic complications  Airway Mallampati: I  TM Distance: >3 FB Neck ROM: Full    Dental  (+) Edentulous Upper, Edentulous Lower, Lower Dentures, Upper Dentures, Dental Advisory Given   Pulmonary shortness of breath history of bronchiectasis status post right middle lobe partial resection (2008)   Pulmonary exam normal breath sounds clear to auscultation       Cardiovascular hypertension, Pt. on home beta blockers and Pt. on medications +CHF  Normal cardiovascular exam+ Valvular Problems/Murmurs (mild AS) AS  Rhythm:Regular Rate:Normal  TTE 2017 - Left ventricle: The cavity size was normal. Systolic function was    normal. The estimated ejection fraction was in the range of 60%    to 65%. Wall motion was normal; there were no regional wall    motion abnormalities. Features are consistent with a pseudonormal    left ventricular filling pattern, with concomitant abnormal    relaxation and increased filling pressure (grade 2 diastolic    dysfunction).  - Aortic valve: There was very mild stenosis. Valve area (VTI):    2.01 cm^2. Valve area (Vmax): 2.09 cm^2. Valve area (Vmean): 1.96    cm^2.  - Mitral valve: Calcified annulus.  - Left atrium: The atrium was mildly dilated.  - Pulmonary arteries: Systolic pressure was mildly to moderately    increased. PA peak pressure: 46 mm Hg (S).     Neuro/Psych   Anxiety     negative neurological ROS     GI/Hepatic Neg liver ROS,GERD  Medicated,,  Endo/Other   Hyperthyroidism   Renal/GU Renal InsufficiencyRenal disease  negative genitourinary   Musculoskeletal  (+) Arthritis ,  LOOSE  LEFT KNEE REPLACEMENT   Abdominal   Peds  Hematology negative hematology ROS (+)   Anesthesia Other Findings Day of surgery medications reviewed with patient.  Reproductive/Obstetrics negative OB ROS                             Anesthesia Physical Anesthesia Plan  ASA: 3  Anesthesia Plan: Spinal and Regional   Post-op Pain Management: Tylenol PO (pre-op)* and Regional block*   Induction:   PONV Risk Score and Plan: 3 and Treatment may vary due to age or medical condition, Ondansetron, Propofol infusion and Dexamethasone  Airway Management Planned: Natural Airway and Simple Face Mask  Additional Equipment: None  Intra-op Plan:   Post-operative Plan:   Informed Consent:   Plan Discussed with:   Anesthesia Plan Comments: (See PAT note from 5/30 by Sherlie Ban PA-C )        Anesthesia Quick Evaluation

## 2023-03-18 ENCOUNTER — Inpatient Hospital Stay (HOSPITAL_COMMUNITY)
Admission: RE | Admit: 2023-03-18 | Discharge: 2023-03-20 | DRG: 467 | Disposition: A | Discharge status: Home Health | Payer: Medicare Other | Attending: Orthopedic Surgery | Admitting: Orthopedic Surgery

## 2023-03-18 ENCOUNTER — Inpatient Hospital Stay (HOSPITAL_COMMUNITY): Payer: Medicare Other | Admitting: Anesthesiology

## 2023-03-18 ENCOUNTER — Other Ambulatory Visit: Payer: Self-pay

## 2023-03-18 ENCOUNTER — Encounter (HOSPITAL_COMMUNITY)
Admission: RE | Disposition: A | Discharge status: Home Health | Payer: Self-pay | Source: Home / Self Care | Attending: Orthopedic Surgery

## 2023-03-18 ENCOUNTER — Inpatient Hospital Stay (HOSPITAL_COMMUNITY): Payer: Medicare Other | Admitting: Medical

## 2023-03-18 ENCOUNTER — Encounter (HOSPITAL_COMMUNITY): Payer: Self-pay | Admitting: Orthopedic Surgery

## 2023-03-18 DIAGNOSIS — Z79899 Other long term (current) drug therapy: Secondary | ICD-10-CM | POA: Diagnosis not present

## 2023-03-18 DIAGNOSIS — G8918 Other acute postprocedural pain: Secondary | ICD-10-CM | POA: Diagnosis not present

## 2023-03-18 DIAGNOSIS — Z8701 Personal history of pneumonia (recurrent): Secondary | ICD-10-CM | POA: Diagnosis not present

## 2023-03-18 DIAGNOSIS — Z902 Acquired absence of lung [part of]: Secondary | ICD-10-CM

## 2023-03-18 DIAGNOSIS — E669 Obesity, unspecified: Secondary | ICD-10-CM | POA: Diagnosis present

## 2023-03-18 DIAGNOSIS — Z8249 Family history of ischemic heart disease and other diseases of the circulatory system: Secondary | ICD-10-CM

## 2023-03-18 DIAGNOSIS — Z9049 Acquired absence of other specified parts of digestive tract: Secondary | ICD-10-CM | POA: Diagnosis not present

## 2023-03-18 DIAGNOSIS — E7800 Pure hypercholesterolemia, unspecified: Secondary | ICD-10-CM | POA: Diagnosis present

## 2023-03-18 DIAGNOSIS — Z96643 Presence of artificial hip joint, bilateral: Secondary | ICD-10-CM | POA: Diagnosis present

## 2023-03-18 DIAGNOSIS — F419 Anxiety disorder, unspecified: Secondary | ICD-10-CM | POA: Diagnosis present

## 2023-03-18 DIAGNOSIS — Z7982 Long term (current) use of aspirin: Secondary | ICD-10-CM | POA: Diagnosis not present

## 2023-03-18 DIAGNOSIS — Z96652 Presence of left artificial knee joint: Secondary | ICD-10-CM | POA: Diagnosis not present

## 2023-03-18 DIAGNOSIS — Z6836 Body mass index (BMI) 36.0-36.9, adult: Secondary | ICD-10-CM | POA: Diagnosis not present

## 2023-03-18 DIAGNOSIS — I509 Heart failure, unspecified: Secondary | ICD-10-CM

## 2023-03-18 DIAGNOSIS — I110 Hypertensive heart disease with heart failure: Secondary | ICD-10-CM | POA: Diagnosis not present

## 2023-03-18 DIAGNOSIS — Z886 Allergy status to analgesic agent status: Secondary | ICD-10-CM

## 2023-03-18 DIAGNOSIS — N182 Chronic kidney disease, stage 2 (mild): Secondary | ICD-10-CM | POA: Diagnosis present

## 2023-03-18 DIAGNOSIS — Z833 Family history of diabetes mellitus: Secondary | ICD-10-CM

## 2023-03-18 DIAGNOSIS — M810 Age-related osteoporosis without current pathological fracture: Secondary | ICD-10-CM | POA: Diagnosis present

## 2023-03-18 DIAGNOSIS — T84033A Mechanical loosening of internal left knee prosthetic joint, initial encounter: Secondary | ICD-10-CM | POA: Diagnosis present

## 2023-03-18 DIAGNOSIS — M1712 Unilateral primary osteoarthritis, left knee: Secondary | ICD-10-CM | POA: Diagnosis present

## 2023-03-18 DIAGNOSIS — I130 Hypertensive heart and chronic kidney disease with heart failure and stage 1 through stage 4 chronic kidney disease, or unspecified chronic kidney disease: Secondary | ICD-10-CM | POA: Diagnosis present

## 2023-03-18 DIAGNOSIS — Z90710 Acquired absence of both cervix and uterus: Secondary | ICD-10-CM | POA: Diagnosis not present

## 2023-03-18 DIAGNOSIS — I350 Nonrheumatic aortic (valve) stenosis: Secondary | ICD-10-CM | POA: Diagnosis present

## 2023-03-18 DIAGNOSIS — Y831 Surgical operation with implant of artificial internal device as the cause of abnormal reaction of the patient, or of later complication, without mention of misadventure at the time of the procedure: Secondary | ICD-10-CM | POA: Diagnosis present

## 2023-03-18 DIAGNOSIS — K219 Gastro-esophageal reflux disease without esophagitis: Secondary | ICD-10-CM | POA: Diagnosis present

## 2023-03-18 DIAGNOSIS — E0590 Thyrotoxicosis, unspecified without thyrotoxic crisis or storm: Secondary | ICD-10-CM | POA: Diagnosis present

## 2023-03-18 DIAGNOSIS — Z823 Family history of stroke: Secondary | ICD-10-CM

## 2023-03-18 DIAGNOSIS — I5032 Chronic diastolic (congestive) heart failure: Secondary | ICD-10-CM | POA: Diagnosis present

## 2023-03-18 DIAGNOSIS — I739 Peripheral vascular disease, unspecified: Secondary | ICD-10-CM | POA: Diagnosis present

## 2023-03-18 HISTORY — PX: TOTAL KNEE REVISION: SHX996

## 2023-03-18 LAB — AEROBIC/ANAEROBIC CULTURE W GRAM STAIN (SURGICAL/DEEP WOUND): Gram Stain: NONE SEEN

## 2023-03-18 SURGERY — TOTAL KNEE REVISION
Anesthesia: Spinal | Site: Knee | Laterality: Left

## 2023-03-18 MED ORDER — FENTANYL CITRATE PF 50 MCG/ML IJ SOSY
100.0000 ug | PREFILLED_SYRINGE | INTRAMUSCULAR | Status: DC
  Administered 2023-03-18: 50 ug via INTRAVENOUS
  Filled 2023-03-18: qty 2

## 2023-03-18 MED ORDER — TRANEXAMIC ACID-NACL 1000-0.7 MG/100ML-% IV SOLN
1000.0000 mg | Freq: Once | INTRAVENOUS | Status: AC
  Administered 2023-03-18: 1000 mg via INTRAVENOUS
  Filled 2023-03-18: qty 100

## 2023-03-18 MED ORDER — DIPHENHYDRAMINE HCL 12.5 MG/5ML PO ELIX
12.5000 mg | ORAL_SOLUTION | ORAL | Status: DC | PRN
  Administered 2023-03-20: 25 mg via ORAL
  Filled 2023-03-18: qty 10

## 2023-03-18 MED ORDER — ONDANSETRON HCL 4 MG PO TABS: 4.0000 mg | ORAL_TABLET | Freq: Four times a day (QID) | ORAL | Status: DC | PRN

## 2023-03-18 MED ORDER — SODIUM CHLORIDE (PF) 0.9 % IJ SOLN
INTRAMUSCULAR | Status: AC
  Filled 2023-03-18: qty 20

## 2023-03-18 MED ORDER — POTASSIUM CHLORIDE CRYS ER 20 MEQ PO TBCR
20.0000 meq | EXTENDED_RELEASE_TABLET | Freq: Every day | ORAL | Status: DC
  Administered 2023-03-19 – 2023-03-20 (×2): 20 meq via ORAL
  Filled 2023-03-18 (×2): qty 1

## 2023-03-18 MED ORDER — FUROSEMIDE 20 MG PO TABS
20.0000 mg | ORAL_TABLET | Freq: Every day | ORAL | Status: DC
  Administered 2023-03-19 – 2023-03-20 (×2): 20 mg via ORAL
  Filled 2023-03-18 (×2): qty 1

## 2023-03-18 MED ORDER — METOCLOPRAMIDE HCL 5 MG PO TABS: 5.0000 mg | ORAL_TABLET | Freq: Three times a day (TID) | ORAL | Status: DC | PRN

## 2023-03-18 MED ORDER — FENTANYL CITRATE PF 50 MCG/ML IJ SOSY
PREFILLED_SYRINGE | INTRAMUSCULAR | Status: AC
  Filled 2023-03-18: qty 2

## 2023-03-18 MED ORDER — PHENYLEPHRINE HCL (PRESSORS) 10 MG/ML IV SOLN
INTRAVENOUS | Status: DC | PRN
  Administered 2023-03-18 (×5): 80 ug via INTRAVENOUS

## 2023-03-18 MED ORDER — OXYCODONE HCL 5 MG PO TABS: 5.0000 mg | ORAL_TABLET | Freq: Once | ORAL | Status: DC | PRN

## 2023-03-18 MED ORDER — PROPOFOL 1000 MG/100ML IV EMUL
INTRAVENOUS | Status: AC
  Filled 2023-03-18: qty 100

## 2023-03-18 MED ORDER — BUPIVACAINE-EPINEPHRINE 0.25% -1:200000 IJ SOLN
INTRAMUSCULAR | Status: DC | PRN
  Administered 2023-03-18: 30 mL

## 2023-03-18 MED ORDER — OXYCODONE HCL 5 MG PO TABS
10.0000 mg | ORAL_TABLET | ORAL | Status: DC | PRN
  Administered 2023-03-19 – 2023-03-20 (×2): 10 mg via ORAL
  Filled 2023-03-18 (×2): qty 2

## 2023-03-18 MED ORDER — FENOFIBRATE 160 MG PO TABS
160.0000 mg | ORAL_TABLET | Freq: Every day | ORAL | Status: DC
  Administered 2023-03-19 – 2023-03-20 (×2): 160 mg via ORAL
  Filled 2023-03-18 (×2): qty 1

## 2023-03-18 MED ORDER — NYSTATIN 100000 UNIT/GM EX CREA
TOPICAL_CREAM | Freq: Two times a day (BID) | CUTANEOUS | Status: DC
  Filled 2023-03-18: qty 30

## 2023-03-18 MED ORDER — BUPIVACAINE LIPOSOME 1.3 % IJ SUSP
INTRAMUSCULAR | Status: DC | PRN
  Administered 2023-03-18: 20 mL

## 2023-03-18 MED ORDER — PROPOFOL 500 MG/50ML IV EMUL
INTRAVENOUS | Status: DC | PRN
  Administered 2023-03-18: 50 ug/kg/min via INTRAVENOUS

## 2023-03-18 MED ORDER — ALUM & MAG HYDROXIDE-SIMETH 200-200-20 MG/5ML PO SUSP: 30.0000 mL | ORAL | Status: DC | PRN

## 2023-03-18 MED ORDER — 0.9 % SODIUM CHLORIDE (POUR BTL) OPTIME
TOPICAL | Status: DC | PRN
  Administered 2023-03-18: 1000 mL

## 2023-03-18 MED ORDER — CEFAZOLIN SODIUM-DEXTROSE 2-4 GM/100ML-% IV SOLN
2.0000 g | INTRAVENOUS | Status: DC
  Filled 2023-03-18: qty 100

## 2023-03-18 MED ORDER — ASPIRIN 81 MG PO CHEW
81.0000 mg | CHEWABLE_TABLET | Freq: Two times a day (BID) | ORAL | Status: DC
  Administered 2023-03-18 – 2023-03-20 (×4): 81 mg via ORAL
  Filled 2023-03-18 (×4): qty 1

## 2023-03-18 MED ORDER — ORAL CARE MOUTH RINSE: 15.0000 mL | OROMUCOSAL | Status: DC | PRN

## 2023-03-18 MED ORDER — POVIDONE-IODINE 10 % EX SWAB
2.0000 | Freq: Once | CUTANEOUS | Status: AC
  Administered 2023-03-18: 2 via TOPICAL

## 2023-03-18 MED ORDER — METHOCARBAMOL 500 MG IVPB - SIMPLE MED: 500.0000 mg | Freq: Four times a day (QID) | INTRAVENOUS | Status: DC | PRN

## 2023-03-18 MED ORDER — STERILE WATER FOR IRRIGATION IR SOLN
Status: DC | PRN
  Administered 2023-03-18 (×2): 2000 mL

## 2023-03-18 MED ORDER — TRANEXAMIC ACID 1000 MG/10ML IV SOLN
INTRAVENOUS | Status: DC | PRN
  Administered 2023-03-18: 2000 mg via TOPICAL

## 2023-03-18 MED ORDER — ACETAMINOPHEN 325 MG PO TABS
325.0000 mg | ORAL_TABLET | Freq: Four times a day (QID) | ORAL | Status: DC | PRN
  Administered 2023-03-19 – 2023-03-20 (×2): 650 mg via ORAL
  Filled 2023-03-18 (×2): qty 2

## 2023-03-18 MED ORDER — OXYCODONE-ACETAMINOPHEN 5-325 MG PO TABS: 1.0000 | ORAL_TABLET | ORAL | 0 refills | Status: DC | PRN

## 2023-03-18 MED ORDER — METHIMAZOLE 2.5 MG HALF TABLET
2.5000 mg | ORAL_TABLET | Freq: Every day | ORAL | Status: DC
  Administered 2023-03-19 – 2023-03-20 (×2): 2.5 mg via ORAL
  Filled 2023-03-18 (×2): qty 1

## 2023-03-18 MED ORDER — ONDANSETRON HCL 4 MG/2ML IJ SOLN
INTRAMUSCULAR | Status: DC | PRN
  Administered 2023-03-18: 4 mg via INTRAVENOUS

## 2023-03-18 MED ORDER — TRANEXAMIC ACID-NACL 1000-0.7 MG/100ML-% IV SOLN
INTRAVENOUS | Status: DC | PRN
  Administered 2023-03-18: 1000 mg via INTRAVENOUS

## 2023-03-18 MED ORDER — CEFAZOLIN SODIUM-DEXTROSE 2-4 GM/100ML-% IV SOLN
2.0000 g | Freq: Four times a day (QID) | INTRAVENOUS | Status: AC
  Administered 2023-03-18 – 2023-03-19 (×2): 2 g via INTRAVENOUS
  Filled 2023-03-18 (×2): qty 100

## 2023-03-18 MED ORDER — HYDROMORPHONE HCL 1 MG/ML IJ SOLN: 0.5000 mg | INTRAMUSCULAR | Status: DC | PRN

## 2023-03-18 MED ORDER — SODIUM CHLORIDE 0.9% IV SOLUTION
INTRAVENOUS | Status: AC | PRN
  Administered 2023-03-18: 1000 mL via INTRAMUSCULAR

## 2023-03-18 MED ORDER — ONDANSETRON HCL 4 MG/2ML IJ SOLN
INTRAMUSCULAR | Status: AC
  Filled 2023-03-18: qty 2

## 2023-03-18 MED ORDER — DEXAMETHASONE SODIUM PHOSPHATE 10 MG/ML IJ SOLN
INTRAMUSCULAR | Status: DC | PRN
  Administered 2023-03-18: 10 mg

## 2023-03-18 MED ORDER — ASPIRIN 81 MG PO TBEC: 81.0000 mg | DELAYED_RELEASE_TABLET | Freq: Two times a day (BID) | ORAL | 0 refills | Status: AC

## 2023-03-18 MED ORDER — EPINEPHRINE PF 1 MG/ML IJ SOLN
INTRAMUSCULAR | Status: AC
  Filled 2023-03-18: qty 1

## 2023-03-18 MED ORDER — ROPIVACAINE HCL 5 MG/ML IJ SOLN
INTRAMUSCULAR | Status: DC | PRN
  Administered 2023-03-18: 20 mL via PERINEURAL

## 2023-03-18 MED ORDER — BUPIVACAINE IN DEXTROSE 0.75-8.25 % IT SOLN
INTRATHECAL | Status: DC | PRN
  Administered 2023-03-18: 2 mL via INTRATHECAL

## 2023-03-18 MED ORDER — LACTATED RINGERS IV SOLN: INTRAVENOUS | Status: DC | PRN

## 2023-03-18 MED ORDER — BUPIVACAINE HCL 0.25 % IJ SOLN
INTRAMUSCULAR | Status: AC
  Filled 2023-03-18: qty 1

## 2023-03-18 MED ORDER — FENTANYL CITRATE PF 50 MCG/ML IJ SOSY
25.0000 ug | PREFILLED_SYRINGE | INTRAMUSCULAR | Status: DC | PRN
  Administered 2023-03-18 (×2): 50 ug via INTRAVENOUS

## 2023-03-18 MED ORDER — BUPIVACAINE LIPOSOME 1.3 % IJ SUSP: 20.0000 mL | Freq: Once | INTRAMUSCULAR | Status: AC

## 2023-03-18 MED ORDER — POLYETHYLENE GLYCOL 3350 17 G PO PACK
17.0000 g | PACK | Freq: Every day | ORAL | Status: DC | PRN
  Administered 2023-03-19: 17 g via ORAL
  Filled 2023-03-18: qty 1

## 2023-03-18 MED ORDER — PROPOFOL 10 MG/ML IV BOLUS
INTRAVENOUS | Status: DC | PRN
  Administered 2023-03-18 (×2): 20 mg via INTRAVENOUS

## 2023-03-18 MED ORDER — DOCUSATE SODIUM 100 MG PO CAPS
100.0000 mg | ORAL_CAPSULE | Freq: Two times a day (BID) | ORAL | Status: DC
  Administered 2023-03-18 – 2023-03-20 (×4): 100 mg via ORAL
  Filled 2023-03-18 (×4): qty 1

## 2023-03-18 MED ORDER — DEXAMETHASONE SODIUM PHOSPHATE 10 MG/ML IJ SOLN
INTRAMUSCULAR | Status: AC
  Filled 2023-03-18: qty 1

## 2023-03-18 MED ORDER — KETOROLAC TROMETHAMINE 0.5 % OP SOLN
1.0000 [drp] | Freq: Once | OPHTHALMIC | Status: AC
  Administered 2023-03-18: 1 [drp] via OPHTHALMIC
  Filled 2023-03-18: qty 5

## 2023-03-18 MED ORDER — OXYCODONE HCL 5 MG PO TABS
5.0000 mg | ORAL_TABLET | ORAL | Status: DC | PRN
  Administered 2023-03-18 – 2023-03-19 (×3): 5 mg via ORAL
  Administered 2023-03-20 (×2): 10 mg via ORAL
  Filled 2023-03-18: qty 1
  Filled 2023-03-18: qty 2
  Filled 2023-03-18 (×2): qty 1
  Filled 2023-03-18 (×2): qty 2

## 2023-03-18 MED ORDER — PHENYLEPHRINE HCL (PRESSORS) 10 MG/ML IV SOLN
INTRAVENOUS | Status: AC
  Filled 2023-03-18: qty 1

## 2023-03-18 MED ORDER — ALBUMIN HUMAN 5 % IV SOLN: INTRAVENOUS | Status: DC | PRN

## 2023-03-18 MED ORDER — DEXAMETHASONE SODIUM PHOSPHATE 10 MG/ML IJ SOLN
INTRAMUSCULAR | Status: DC | PRN
  Administered 2023-03-18: 8 mg via INTRAVENOUS

## 2023-03-18 MED ORDER — DEXAMETHASONE SODIUM PHOSPHATE 10 MG/ML IJ SOLN
10.0000 mg | Freq: Once | INTRAMUSCULAR | Status: AC
  Administered 2023-03-19: 10 mg via INTRAVENOUS
  Filled 2023-03-18: qty 1

## 2023-03-18 MED ORDER — ACETAMINOPHEN 500 MG PO TABS
1000.0000 mg | ORAL_TABLET | Freq: Once | ORAL | Status: AC
  Administered 2023-03-18: 1000 mg via ORAL
  Filled 2023-03-18: qty 2

## 2023-03-18 MED ORDER — SODIUM CHLORIDE (PF) 0.9 % IJ SOLN
INTRAMUSCULAR | Status: AC
  Filled 2023-03-18: qty 50

## 2023-03-18 MED ORDER — CHLORHEXIDINE GLUCONATE 0.12 % MT SOLN
15.0000 mL | Freq: Once | OROMUCOSAL | Status: AC
  Administered 2023-03-18: 15 mL via OROMUCOSAL

## 2023-03-18 MED ORDER — LACTATED RINGERS IV SOLN: INTRAVENOUS | Status: DC

## 2023-03-18 MED ORDER — PHENYLEPHRINE 80 MCG/ML (10ML) SYRINGE FOR IV PUSH (FOR BLOOD PRESSURE SUPPORT)
PREFILLED_SYRINGE | INTRAVENOUS | Status: AC
  Filled 2023-03-18: qty 10

## 2023-03-18 MED ORDER — PHENYLEPHRINE HCL-NACL 20-0.9 MG/250ML-% IV SOLN
INTRAVENOUS | Status: DC | PRN
  Administered 2023-03-18: 50 ug/min via INTRAVENOUS

## 2023-03-18 MED ORDER — ORAL CARE MOUTH RINSE: 15.0000 mL | Freq: Once | OROMUCOSAL | Status: AC

## 2023-03-18 MED ORDER — MIDAZOLAM HCL 2 MG/2ML IJ SOLN
2.0000 mg | INTRAMUSCULAR | Status: DC
  Filled 2023-03-18: qty 2

## 2023-03-18 MED ORDER — METOCLOPRAMIDE HCL 5 MG/ML IJ SOLN: 5.0000 mg | Freq: Three times a day (TID) | INTRAMUSCULAR | Status: DC | PRN

## 2023-03-18 MED ORDER — MENTHOL 3 MG MT LOZG: 1.0000 | LOZENGE | OROMUCOSAL | Status: DC | PRN

## 2023-03-18 MED ORDER — TRANEXAMIC ACID-NACL 1000-0.7 MG/100ML-% IV SOLN
1000.0000 mg | INTRAVENOUS | Status: AC
  Filled 2023-03-18: qty 100

## 2023-03-18 MED ORDER — KCL IN DEXTROSE-NACL 20-5-0.45 MEQ/L-%-% IV SOLN
INTRAVENOUS | Status: DC
  Filled 2023-03-18 (×3): qty 1000

## 2023-03-18 MED ORDER — DEXMEDETOMIDINE HCL IN NACL 80 MCG/20ML IV SOLN
INTRAVENOUS | Status: AC
  Filled 2023-03-18: qty 20

## 2023-03-18 MED ORDER — PHENOL 1.4 % MT LIQD: 1.0000 | OROMUCOSAL | Status: DC | PRN

## 2023-03-18 MED ORDER — TIZANIDINE HCL 2 MG PO TABS: 2.0000 mg | ORAL_TABLET | Freq: Four times a day (QID) | ORAL | 0 refills | Status: DC | PRN

## 2023-03-18 MED ORDER — CEFAZOLIN SODIUM-DEXTROSE 2-3 GM-%(50ML) IV SOLR
INTRAVENOUS | Status: DC | PRN
  Administered 2023-03-18: 2 g via INTRAVENOUS

## 2023-03-18 MED ORDER — ONDANSETRON HCL 4 MG/2ML IJ SOLN
4.0000 mg | Freq: Four times a day (QID) | INTRAMUSCULAR | Status: DC | PRN
  Administered 2023-03-19: 4 mg via INTRAVENOUS
  Filled 2023-03-18: qty 2

## 2023-03-18 MED ORDER — BUSPIRONE HCL 5 MG PO TABS
5.0000 mg | ORAL_TABLET | Freq: Every day | ORAL | Status: DC
  Administered 2023-03-19 – 2023-03-20 (×2): 5 mg via ORAL
  Filled 2023-03-18 (×2): qty 1

## 2023-03-18 MED ORDER — BUPIVACAINE LIPOSOME 1.3 % IJ SUSP
INTRAMUSCULAR | Status: AC
  Filled 2023-03-18: qty 20

## 2023-03-18 MED ORDER — METHOCARBAMOL 500 MG PO TABS
500.0000 mg | ORAL_TABLET | Freq: Four times a day (QID) | ORAL | Status: DC | PRN
  Administered 2023-03-19 – 2023-03-20 (×2): 500 mg via ORAL
  Filled 2023-03-18 (×2): qty 1

## 2023-03-18 MED ORDER — PANTOPRAZOLE SODIUM 40 MG PO TBEC
40.0000 mg | DELAYED_RELEASE_TABLET | Freq: Every day | ORAL | Status: DC
  Administered 2023-03-19 – 2023-03-20 (×2): 40 mg via ORAL
  Filled 2023-03-18 (×2): qty 1

## 2023-03-18 MED ORDER — METOPROLOL SUCCINATE ER 25 MG PO TB24
25.0000 mg | ORAL_TABLET | Freq: Every day | ORAL | Status: DC
  Administered 2023-03-19 – 2023-03-20 (×2): 25 mg via ORAL
  Filled 2023-03-18 (×2): qty 1

## 2023-03-18 MED ORDER — OXYCODONE HCL 5 MG/5ML PO SOLN: 5.0000 mg | Freq: Once | ORAL | Status: DC | PRN

## 2023-03-18 MED ORDER — BISACODYL 5 MG PO TBEC: 5.0000 mg | DELAYED_RELEASE_TABLET | Freq: Every day | ORAL | Status: DC | PRN

## 2023-03-18 MED ORDER — LACTATED RINGERS IV BOLUS
500.0000 mL | Freq: Once | INTRAVENOUS | Status: AC
  Administered 2023-03-18: 500 mL via INTRAVENOUS

## 2023-03-18 MED ORDER — FLEET ENEMA 7-19 GM/118ML RE ENEM: 1.0000 | ENEMA | Freq: Once | RECTAL | Status: DC | PRN

## 2023-03-18 MED ORDER — ACETAMINOPHEN 500 MG PO TABS
1000.0000 mg | ORAL_TABLET | Freq: Four times a day (QID) | ORAL | Status: AC
  Administered 2023-03-18 – 2023-03-19 (×4): 1000 mg via ORAL
  Filled 2023-03-18 (×4): qty 2

## 2023-03-18 MED ORDER — TRANEXAMIC ACID 1000 MG/10ML IV SOLN
2000.0000 mg | INTRAVENOUS | Status: AC
  Filled 2023-03-18 (×2): qty 20

## 2023-03-18 SURGICAL SUPPLY — 67 items
BAG COUNTER SPONGE SURGICOUNT (BAG) IMPLANT
BAG DECANTER FOR FLEXI CONT (MISCELLANEOUS) ×1 IMPLANT
BAG SPEC THK2 15X12 ZIP CLS (MISCELLANEOUS) ×1
BAG SPNG CNTER NS LX DISP (BAG)
BAG ZIPLOCK 12X15 (MISCELLANEOUS) ×1 IMPLANT
BLADE OSCILLATING/SAGITTAL (BLADE) ×2
BLADE SAG 18X100X1.27 (BLADE) ×1 IMPLANT
BLADE SAW SGTL 11.0X1.19X90.0M (BLADE) ×1 IMPLANT
BLADE SAW SGTL 81X20 HD (BLADE) ×1 IMPLANT
BLADE SURG SZ10 CARB STEEL (BLADE) ×2 IMPLANT
BLADE SW THK.38XMED LNG THN (BLADE) ×2 IMPLANT
BNDG CMPR MED 10X6 ELC LF (GAUZE/BANDAGES/DRESSINGS) ×1
BNDG ELASTIC 6X10 VLCR STRL LF (GAUZE/BANDAGES/DRESSINGS) ×2 IMPLANT
BOWL SMART MIX CTS (DISPOSABLE) ×1 IMPLANT
BRUSH FEMORAL CANAL (MISCELLANEOUS) ×1 IMPLANT
BSPLAT TIB 8 CMNT REV ROT PLAT (Knees) ×1 IMPLANT
BUR OVAL CARBIDE 4.0 (BURR) IMPLANT
CEMENT HV SMART SET (Cement) ×2 IMPLANT
COMP FEM ATTUNE CRS SZ6 LT (Femur) ×1 IMPLANT
COMPONENT FEM ATN CRS SZ6 LT (Femur) IMPLANT
COVER SURGICAL LIGHT HANDLE (MISCELLANEOUS) ×1 IMPLANT
CUFF TOURN SGL QUICK 34 (TOURNIQUET CUFF) ×1
CUFF TRNQT CYL 34X4.125X (TOURNIQUET CUFF) ×1 IMPLANT
DRAPE INCISE IOBAN 66X45 STRL (DRAPES) IMPLANT
DRAPE ORTHO SPLIT 77X108 STRL (DRAPES)
DRAPE SURG ORHT 6 SPLT 77X108 (DRAPES) IMPLANT
DRAPE U-SHAPE 47X51 STRL (DRAPES) ×1 IMPLANT
DRESSING AQUACEL AG SP 3.5X10 (GAUZE/BANDAGES/DRESSINGS) IMPLANT
DRSG AQUACEL AG ADV 3.5X10 (GAUZE/BANDAGES/DRESSINGS) IMPLANT
DRSG AQUACEL AG ADV 3.5X14 (GAUZE/BANDAGES/DRESSINGS) IMPLANT
DRSG AQUACEL AG SP 3.5X10 (GAUZE/BANDAGES/DRESSINGS)
DURAPREP 26ML APPLICATOR (WOUND CARE) ×1 IMPLANT
ELECT REM PT RETURN 15FT ADLT (MISCELLANEOUS) ×1 IMPLANT
GLOVE BIO SURGEON STRL SZ7.5 (GLOVE) ×1 IMPLANT
GLOVE BIO SURGEON STRL SZ8.5 (GLOVE) ×1 IMPLANT
GLOVE BIOGEL PI IND STRL 8 (GLOVE) ×1 IMPLANT
GLOVE BIOGEL PI IND STRL 9 (GLOVE) ×1 IMPLANT
GOWN STRL REUS W/ TWL XL LVL3 (GOWN DISPOSABLE) ×2 IMPLANT
GOWN STRL REUS W/TWL XL LVL3 (GOWN DISPOSABLE) ×2
HANDPIECE INTERPULSE COAX TIP (DISPOSABLE) ×1
HOOD PEEL AWAY T7 (MISCELLANEOUS) ×3 IMPLANT
INSERT ROTATNG PLT SZ6 6 KNEE (Miscellaneous) IMPLANT
INSERT TIB CMT ATTUNE RP SZ8 (Knees) IMPLANT
KIT TURNOVER KIT A (KITS) IMPLANT
NDL HYPO 21X1.5 SAFETY (NEEDLE) ×2 IMPLANT
NEEDLE HYPO 21X1.5 SAFETY (NEEDLE) ×2 IMPLANT
NS IRRIG 1000ML POUR BTL (IV SOLUTION) ×1 IMPLANT
PACK TOTAL KNEE CUSTOM (KITS) ×1 IMPLANT
PIN STEINMAN FIXATION KNEE (PIN) IMPLANT
PROTECTOR NERVE ULNAR (MISCELLANEOUS) ×1 IMPLANT
SET HNDPC FAN SPRY TIP SCT (DISPOSABLE) ×1 IMPLANT
SLEEVE KNEE ATTUNE 29MM (Knees) IMPLANT
SPIKE FLUID TRANSFER (MISCELLANEOUS) ×3 IMPLANT
STAPLER VISISTAT 35W (STAPLE) IMPLANT
STEM STR ATTUNE PF 16X110 (Knees) IMPLANT
STEM STR ATTUNE PF 18X110 (Knees) IMPLANT
SUT VIC AB 1 CTX 36 (SUTURE) ×1
SUT VIC AB 1 CTX36XBRD ANBCTR (SUTURE) ×1 IMPLANT
SUT VIC AB 3-0 CT1 27 (SUTURE) ×2
SUT VIC AB 3-0 CT1 TAPERPNT 27 (SUTURE) ×2 IMPLANT
SWAB COLLECTION DEVICE MRSA (MISCELLANEOUS) IMPLANT
SWAB CULTURE ESWAB REG 1ML (MISCELLANEOUS) IMPLANT
SYR CONTROL 10ML LL (SYRINGE) ×2 IMPLANT
TRAY FOLEY MTR SLVR 16FR STAT (SET/KITS/TRAYS/PACK) ×1 IMPLANT
TUBE SUCTION HIGH CAP CLEAR NV (SUCTIONS) ×1 IMPLANT
WATER STERILE IRR 1000ML POUR (IV SOLUTION) ×2 IMPLANT
WRAP KNEE MAXI GEL POST OP (GAUZE/BANDAGES/DRESSINGS) ×1 IMPLANT

## 2023-03-18 NOTE — Op Note (Signed)
PATIENT ID:      [name]  MRN:     [id number] DOB/AGE:    1933-09-27 / 89 y.o.       OPERATIVE REPORT   DATE OF PROCEDURE:  03/18/2023      PREOPERATIVE DIAGNOSIS:   LOOSE LEFT KNEE REPLACEMENT      Estimated body mass index is 36.52 kg/m as calculated from the following:   Height as of this encounter: 5' (1.524 m).   Weight as of this encounter: 84.8 kg.                                                       POSTOPERATIVE DIAGNOSIS:   Loose DePuy attune left total knee femoral and tibial implants                                                       PROCEDURE: Revision left total knee arthroplasty using DePuy attune revision components.  6 left revision femur, 18 mm x 110 mm stem.  8 revision tibial component 29 mm sleeve 16 x 110 mm stem    SURGEON: Nestor Lewandowsky  ASSISTANT:   Tomi Likens. [employer]   (Present and scrubbed throughout the case, critical for assistance with exposure, retraction, instrumentation, and closure.)        ANESTHESIA: Spinal, 20cc Exparel, 50cc 0.25% Marcaine EBL: 700 cc cc FLUID REPLACEMENT: 2 L cc crystaloid TOURNIQUET: Not used DRAINS: None TRANEXAMIC ACID: 1gm IV, 2gm topical COMPLICATIONS:         INDICATIONS FOR PROCEDURE: 89 year old female had a primary DePuy attune left total knee arthroplasty done in 2021 by another surgeon.  Presented to my office a few months ago with increasing varus deformity and pain.  Radiographs showed 8 to 9 mm of subsidence to the medial side of the tibial implant which appeared to be somewhat undersized.  Patient had a 10 degree varus deformity range of motion was from 10 to 110 degrees.  Collateral ligaments were 1-2+ lax with a good endpoint.  No fevers or chills minimal effusion.  Inflammatory markers were negative.  She was actually ambulating without a crutch or cane around the house she used a cane when she was outdoors.  To decrease pain and increase function revision of the left total knee arthroplasty loose  implants was discussed with the patient and consented to.  There is also a clear zone under the anterior flange of the femoral component.  Risks and benefits of surgery have been discussed, questions answered.   DESCRIPTION OF PROCEDURE: The patient identified by armband, received  IV antibiotics, in the holding area at [facility]. Patient taken to the operating room, appropriate anesthetic monitors were attached, and spinal anesthesia was  induced. IV Tranexamic acid was given. Lateral post and foot positioner applied to the table, the lower extremity was then prepped and draped in usual sterile fashion from the toes to the proximal thigh.  Time-out procedure was performed. The skin and subcutaneous tissue along the incision was injected with 20 cc of a mixture of Exparel and Marcaine solution, using a 20-gauge by 1-1/2 inch needle. We began the operation,  with the knee flexed 130 degrees, by making the anterior midline incision starting at handbreadth above the patella going over the patella 1 cm medial to and 4 cm distal to the tibial tubercle. Small bleeders in the skin and the subcutaneous tissue identified and cauterized. Transverse retinaculum was incised and reflected medially and a medial parapatellar arthrotomy was accomplished.  The medial collateral ligament was elevated off the medial tibia from proximal to distal.  Fortune there is no fracture noted of the medial tibial plateau even though the implant had subsided 9 mm.  We also remove scar tissue from anterior and lateral to mobilize the patella to allow to sublux laterally.  This allowed [place] to fully flex the knee and expose the femoral implant.  Synovium from around the implant was removed.  We grafted it with the DePuy inserting clamp and noted that the femoral implant actually moved.  Using a combination of straight and curved osteotomes the femoral implant was then removed.  This allowed better access to the tibial implant which was also  noted to be grossly loose.  And after removing scar tissue the tibial implant was also removed.  Again there is no fracture of the medial tibial plateau which was fortunate.  We then instrumented the tibia with the DePuy reamer starting out with the 9 mm end-cutting reamer and reaming up to 16 mm obtaining good cortical chatter for a 110 mm stem.  Because of the cavitary defect medially we elected to place a sleeve and broached up to the 29 mm sleeve to the appropriate depth allowing removal of 1 to 2 mm from the rim which was still intact.  Proximal tibia cutting guide was attached to the 29 mm sleeve broach and the proximal tibia cut accomplished.  The broach was then removed we sized for a #8 attune revision tibial baseplate which was pinned into place followed by the smoke stack and the conical reamer.  We then assembled a number a trial with a 16 mm x 110 mm stem and hammered into place.  The keel cutting broach was then applied to the tibial implant.  We then directed our attention to the femur which was sequentially reamed up to an 18 mm reamer to the depth appropriate for a 110 mm stem.  With the reamer in place we then performed our distal femoral dust cut with a distal femoral cutting guide, sized for a #6 femur.  With the cutting guide pinned in place we performed a posterior dust cut posterior chamfer dust cut and an anterior cut.  We then assembled the anterior chamfer cutting guide and performed the anterior chamfer dust cut.  With the distal femoral cutting guide in place with the anterior chamfer cutting guide pinned in place we then performed at the notch cut with a small oscillating saw.  Cutting guide was removed we assembled a femoral trial with a 6 left attune femoral implant with a 18 mm x 110 mm stem which was hammered into place with good fit and fill.  We then performed trial reductions with 6 and 8 mm high post trials and found full extension and good stability with the 6 mm high post  trial.  At this point all the trial implants removed.  Using a combination of Rogers curettes and sharp dissection fibrous membrane was removed from the distal femur and proximal tibia along with any loose bits of cement.  The bony surfaces were then Waterpik to clean and dried.  At the  back table we assembled the real implants consisting of revision #8 attune tibia 16 mm x 110 mm stem with a 29 mm sleeve.  For the femoral side we used a 6 left attune revision femoral implant with an 18 mm x 110 mm stem.  A triple batch of Depuy HV cement was then mixed applied to all the bony and metallic mating surfaces.  We hammered in the tibial implant and removed excess cement, the femoral implant removed excess cement, inserted the 6 mm high post bearing and reduced the knee.  The knee was held in 30 degrees of flexion using the foot positioner to apply compression.  The wound was then Waterpik to clean again Exparel was applied to the synovium medial collateral ligaments and the posterior capsule as well.  This was actually mixture of Exparel and Marcaine  At this point we closed the wound with running #1 Vicryl followed by 3-0 Vicryl in the subcutaneous and subcuticular tissues.  Aquacel dressing, TED hose and Ace wrap was then applied.  The patient was awakened taken the recovery room without difficulty.   The knee was held at 30 flexion with compression, while the cement cured. The wound was irrigated out with normal saline solution pulse lavage. The rest of the Exparel was injected into the parapatellar arthrotomy, subcutaneous tissues, and periosteal tissues. The parapatellar arthrotomy was closed with running #1 Vicryl suture. The subcutaneous tissue with 0 and 2-0 undyed Vicryl suture, and the skin with running 3-0 SQ vicryl. An Aquacil and Ace wrap were applied. The patient was taken to recovery room without difficulty.   Nestor Lewandowsky 03/18/2023, 11:10 AM

## 2023-03-18 NOTE — Progress Notes (Signed)
Orthopedic Tech Progress Note Patient Details:  [name] 1933/09/25 [id number]  Ortho Devices Type of Ortho Device: Bone foam zero knee Ortho Device/Splint Interventions: Ordered      [name] 03/18/2023, 4:53 PM

## 2023-03-18 NOTE — Interval H&P Note (Signed)
History and Physical Interval Note:  03/18/2023 11:09 AM  [name]  has presented today for surgery, with the diagnosis of LOOSE LEFT KNEE REPLACEMENT.  The various methods of treatment have been discussed with the patient and family. After consideration of risks, benefits and other options for treatment, the patient has consented to  Procedure(s): LEFT TOTAL KNEE REVISION (Left) as a surgical intervention.  The patient's history has been reviewed, patient examined, no change in status, stable for surgery.  I have reviewed the patient's chart and labs.  Questions were answered to the patient's satisfaction.     Nestor Lewandowsky

## 2023-03-18 NOTE — Anesthesia Procedure Notes (Signed)
Anesthesia Regional Block: Adductor canal block   Pre-Anesthetic Checklist: , timeout performed,  Correct Patient, Correct Site, Correct Laterality,  Correct Procedure, Correct Position, site marked,  Risks and benefits discussed,  Pre-op evaluation,  At surgeon's request and post-op pain management  Laterality: Left  Prep: Maximum Sterile Barrier Precautions used, chloraprep       Needles:  Injection technique: Single-shot  Needle Type: Echogenic Stimulator Needle     Needle Length: 9cm  Needle Gauge: 21     Additional Needles:   Procedures:,,,, ultrasound used (permanent image in chart),,    Narrative:  Start time: 03/18/2023 11:15 AM End time: 03/18/2023 11:19 AM Injection made incrementally with aspirations every 5 mL. Anesthesiologist: Elmer Picker, MD

## 2023-03-18 NOTE — Discharge Instructions (Signed)

## 2023-03-18 NOTE — Transfer of Care (Signed)
Immediate Anesthesia Transfer of Care Note  Patient: [name]  Procedure(s) Performed: LEFT TOTAL KNEE REVISION (Left: Knee)  Patient Location: PACU  Anesthesia Type:Spinal  Level of Consciousness: awake, alert , and patient cooperative  Airway & Oxygen Therapy: Patient Spontanous Breathing and Patient connected to face mask oxygen  Post-op Assessment: Report given to RN and Post -op Vital signs reviewed and stable  Post vital signs: Reviewed and stable  Last Vitals:  Vitals Value Taken Time  BP 96/52 03/18/23 1610  Temp    Pulse 49 03/18/23 1613  Resp 19 03/18/23 1613  SpO2 100 % 03/18/23 1613  Vitals shown include unvalidated device data.  Last Pain:  Vitals:   03/18/23 1115  TempSrc: Oral  PainSc:       Patients Stated Pain Goal: 5 (03/18/23 1038)  Complications: No notable events documented.

## 2023-03-18 NOTE — Anesthesia Procedure Notes (Signed)
Spinal  Patient location during procedure: OR Start time: 03/18/2023 12:28 PM End time: 03/18/2023 12:31 PM Reason for block: surgical anesthesia Staffing Performed: anesthesiologist  Anesthesiologist: Elmer Picker, MD Performed by: Elmer Picker, MD Authorized by: Elmer Picker, MD   Preanesthetic Checklist Completed: patient identified, IV checked, risks and benefits discussed, surgical consent, monitors and equipment checked, pre-op evaluation and timeout performed Spinal Block Patient position: sitting Prep: DuraPrep and site prepped and draped Patient monitoring: cardiac monitor, continuous pulse ox and blood pressure Approach: midline Location: L3-4 Injection technique: single-shot Needle Needle type: Pencan  Needle gauge: 24 G Needle length: 9 cm Assessment Sensory level: T6 Events: CSF return Additional Notes Functioning IV was confirmed and monitors were applied. Sterile prep and drape, including hand hygiene and sterile gloves were used. The patient was positioned and the spine was prepped. The skin was anesthetized with lidocaine.  Free flow of clear CSF was obtained prior to injecting local anesthetic into the CSF.  The spinal needle aspirated freely following injection.  The needle was carefully withdrawn.  The patient tolerated the procedure well.

## 2023-03-19 ENCOUNTER — Encounter (HOSPITAL_COMMUNITY): Payer: Self-pay | Admitting: Orthopedic Surgery

## 2023-03-19 LAB — AEROBIC/ANAEROBIC CULTURE W GRAM STAIN (SURGICAL/DEEP WOUND)

## 2023-03-19 NOTE — TOC Transition Note (Signed)
Transition of Care [facility]) - CM/SW Discharge Note  Patient Details  Name: [name] MRN: [id number] Date of Birth: Oct 02, 1933  [facility]) CM/SW Contact:  Ewing Schlein, LCSW Phone Number: 03/19/2023, 1:04 PM  Clinical Narrative: Patient is expected to discharge home after working with PT. CSW met with patient and family to review discharge plan and needs. Patient will go home with HHPT through [facility]. Patient has a rolling walker at home, so there are no DME needs at this time. TOC signing off.  Final next level of care: Home w Home Health Services Barriers to Discharge: No Barriers Identified  Patient Goals and CMS Choice CMS Medicare.gov Compare Post Acute Care list provided to:: Patient Choice offered to / list presented to : Patient  Discharge Plan and Services Additional resources added to the After Visit Summary for   [facility] Agency: [facility] Date [facility] Agency Contacted: 03/19/23 Representative spoke with at [facility] Agency: Tresa Endo  Social Determinants of Health (SDOH) Interventions SDOH Screenings   Food Insecurity: No Food Insecurity (03/18/2023)  Housing: Low Risk  (03/18/2023)  Transportation Needs: No Transportation Needs (03/18/2023)  Utilities: Not At Risk (03/18/2023)  Depression (PHQ2-9): Low Risk  (01/15/2023)  Financial Resource Strain: Low Risk  (08/02/2022)  Physical Activity: Inactive (08/02/2022)  Stress: Stress Concern Present (08/02/2022)  Tobacco Use: Low Risk  (03/19/2023)   Readmission Risk Interventions     No data to display

## 2023-03-19 NOTE — Anesthesia Postprocedure Evaluation (Signed)
Anesthesia Post Note  Patient: [name]  Procedure(s) Performed: LEFT TOTAL KNEE REVISION (Left: Knee)     Patient location during evaluation: PACU Anesthesia Type: Spinal and Regional Level of consciousness: oriented and awake and alert Pain management: pain level controlled Vital Signs Assessment: post-procedure vital signs reviewed and stable Respiratory status: spontaneous breathing, respiratory function stable and patient connected to nasal cannula oxygen Cardiovascular status: blood pressure returned to baseline and stable Postop Assessment: no headache, no backache and no apparent nausea or vomiting Anesthetic complications: no  No notable events documented.  Last Vitals:  Vitals:   03/19/23 1343 03/19/23 1739  BP: (!) 107/58 106/63  Pulse: (!) 54 (!) 53  Resp: 18 16  Temp: 36.4 C 36.4 C  SpO2: 97% 99%    Last Pain:  Vitals:   03/19/23 1845  TempSrc:   PainSc: 5                  [name]

## 2023-03-19 NOTE — Progress Notes (Signed)
Patient ID: [name], female   DOB: July 18, 1933, 89 y.o.   MRN: [id number] PATIENT ID: [name]  MRN: [id number]  DOB/AGE:  Mar 08, 1933 / 89 y.o.  1 Day Post-Op Procedure(s) (LRB): LEFT TOTAL KNEE REVISION (Left)    PROGRESS NOTE Subjective: Patient is alert, oriented, no Nausea, no Vomiting, yes passing gas. Taking PO well. Denies SOB, Chest or Calf Pain. Using Incentive Spirometer, PAS in place. Ambulate WBAT, Patient reports pain as 2/10 .    Objective: Vital signs in last 24 hours: Vitals:   03/18/23 1808 03/18/23 2048 03/19/23 0215 03/19/23 0547  BP: 127/72 117/76 100/60 (!) 99/59  Pulse: 60 (!) 55 (!) 51   Resp: 20 17 17 17   Temp: 97.8 F (36.6 C) 98.1 F (36.7 C) 97.8 F (36.6 C) 98.1 F (36.7 C)  TempSrc: Oral Oral Oral Oral  SpO2: 99% 100% 98% 97%  Weight:      Height:          Intake/Output from previous day: I/O last 3 completed shifts: In: 4052.5 [P.O.:300; I.V.:2702.5; IV Piggyback:1050] Out: 2750 [Urine:2050; Blood:700]   Intake/Output this shift: No intake/output data recorded.   LABORATORY DATA: No results for input(s): "WBC", "HGB", "HCT", "PLT", "NA", "K", "CL", "CO2", "BUN", "CREATININE", "GLUCOSE", "GLUCAP", "INR", "CALCIUM" in the last 72 hours.  Invalid input(s): "PT", "2"  Examination: Neurologically intact ABD soft Neurovascular intact Sensation intact distally Intact pulses distally Dorsiflexion/Plantar flexion intact Incision: dressing C/D/I No cellulitis present Compartment soft}  Assessment:   1 Day Post-Op Procedure(s) (LRB): LEFT TOTAL KNEE REVISION (Left) ADDITIONAL DIAGNOSIS: Expected Acute Blood Loss Anemia, Obesity, history of CHF Anticipated LOS equal to or greater than 2 midnights due to - Age 87 and older with one or more of the following:  - Obesity  - Expected need for hospital services (PT, OT, Nursing) required for safe  discharge  - Anticipated need for postoperative skilled nursing care or inpatient  rehab  - Inpatient only procedure  Plan: PT/OT WBAT, AROM and PROM  DVT Prophylaxis:  SCDx72hrs, ASA 81 mg BID x 2 weeks DISCHARGE PLAN: Home DISCHARGE NEEDS: HHPT, Walker, and 3-in-1 comode seat     Nestor Lewandowsky 03/19/2023, 8:05 AM

## 2023-03-19 NOTE — Evaluation (Signed)
Physical Therapy Evaluation Patient Details Name: [name] MRN: [id number] DOB: 1933-02-15 Today's Date: 03/19/2023  History of Present Illness  Pt is an 89 y/o F admitted on 03/18/23 for L TKA revision. PMH: anxiety, aortic stenosis, CHF, CKD, dyspnea, GERD, heart murmur, HTN, hyperthyroidism, B THA  Clinical Impression  Pt seen for PT evaluation with pt agreeable to tx. Pt reports prior to admission she was ambulatory primarily using a rollator (but does have RW & SPC). On this date, PT provided pt with HEP handout & pt performed strengthening & ROM exercises with cuing for technique, AAROM PRN. Pt is able to transfer STS with min assist & ambulate with RW & min assist<>CGA. Recommend ongoing PT services to address deficits noted below to reduce fall risk & increase independence with mobility prior to d/c home.       Recommendations for follow up therapy are one component of a multi-disciplinary discharge planning process, led by the attending physician.  Recommendations may be updated based on patient status, additional functional criteria and insurance authorization.  Follow Up Recommendations       Assistance Recommended at Discharge Intermittent Supervision/Assistance  Patient can return home with the following  A little help with walking and/or transfers;A little help with bathing/dressing/bathroom;Assistance with cooking/housework;Assist for transportation;Help with stairs or ramp for entrance    Equipment Recommendations None recommended by PT  Recommendations for Other Services       Functional Status Assessment Patient has had a recent decline in their functional status and demonstrates the ability to make significant improvements in function in a reasonable and predictable amount of time.     Precautions / Restrictions Precautions Precautions: Fall Restrictions Weight Bearing Restrictions: Yes LLE Weight Bearing: Weight bearing as tolerated      Mobility  Bed  Mobility Overal bed mobility: Modified Independent Bed Mobility: Supine to Sit     Supine to sit: Modified independent (Device/Increase time), HOB elevated     General bed mobility comments: Uses UE to assist LLE to EOB    Transfers Overall transfer level: Needs assistance Equipment used: Rolling walker (2 wheels) Transfers: Sit to/from Stand Sit to Stand: Min assist           General transfer comment: STS from EOB with education re: hand placement    Ambulation/Gait Ambulation/Gait assistance: Min assist, Min guard   Assistive device: Rolling walker (2 wheels) Gait Pattern/deviations: Decreased stride length, Decreased step length - right, Decreased step length - left Gait velocity: decreased     General Gait Details: Slightly pushes RW out in front of her  Stairs            Wheelchair Mobility    Modified Rankin (Stroke Patients Only)       Balance Overall balance assessment: Needs assistance Sitting-balance support: Feet supported, Bilateral upper extremity supported, No upper extremity supported Sitting balance-Leahy Scale: Good     Standing balance support: During functional activity, Reliant on assistive device for balance, Bilateral upper extremity supported Standing balance-Leahy Scale: Fair                               Pertinent Vitals/Pain Pain Assessment Pain Assessment: Faces Faces Pain Scale: Hurts little more Pain Location: L knee with movement Pain Descriptors / Indicators: Grimacing, Discomfort Pain Intervention(s): Monitored during session, Limited activity within patient's tolerance, Repositioned    Home Living Family/patient expects to be discharged to:: Private residence Living Arrangements:  Alone Available Help at Discharge: Family Type of Home: House Home Access: Ramped entrance       Home Layout: Two level;Able to live on main level with bedroom/bathroom Home Equipment: Rollator (4 wheels);Rolling Walker  (2 wheels);Cane - single point      Prior Function               Mobility Comments: Pt reports she's ambulatory primarily with rollator.       Hand Dominance        Extremity/Trunk Assessment   Upper Extremity Assessment Upper Extremity Assessment: Overall WFL for tasks assessed    Lower Extremity Assessment Lower Extremity Assessment: Generalized weakness;LLE deficits/detail LLE Deficits / Details: 2+/5 knee extension in sitting       Communication   Communication: No difficulties  Cognition Arousal/Alertness: Awake/alert Behavior During Therapy: WFL for tasks assessed/performed Overall Cognitive Status: Within Functional Limits for tasks assessed                                          General Comments General comments (skin integrity, edema, etc.): Pt c/o slight dizziness, BP sitting in recliner after gait 118/66 mmHg MAP 80, HR 51 bpm    Exercises Total Joint Exercises Ankle Circles/Pumps: AROM, Supine, Left, 10 reps Quad Sets: AROM, Supine, Strengthening, Left, 10 reps Towel Squeeze: AROM, Supine, Strengthening, 10 reps (hip adduction squeezes) Short Arc Quad: AROM, Supine, Strengthening, Left, 10 reps Heel Slides: AROM, Supine, AAROM, Strengthening, 10 reps, Left Hip ABduction/ADduction: AROM, Supine, AAROM, Strengthening, Left, 10 reps (hip abduction slides) Straight Leg Raises: Supine, AAROM, Strengthening, Left, 10 reps Long Arc Quad: AROM, Seated, Strengthening, Left, 10 reps Knee Flexion: AROM, AAROM, Strengthening, Seated, Left, 20 reps   Assessment/Plan    PT Assessment Patient needs continued PT services  PT Problem List Decreased strength;Pain;Decreased range of motion;Decreased activity tolerance;Decreased balance;Decreased mobility;Decreased knowledge of precautions       PT Treatment Interventions DME instruction;Therapeutic exercise;Gait training;Stair training;Modalities;Neuromuscular re-education;Balance  training;Functional mobility training;Therapeutic activities;Patient/family education    PT Goals (Current goals can be found in the Care Plan section)  Acute Rehab PT Goals Patient Stated Goal: stay another night to make sure she's definitely ready to go home tomorrow PT Goal Formulation: With patient Time For Goal Achievement: 04/02/23 Potential to Achieve Goals: Good    Frequency BID     Co-evaluation               AM-PAC PT "6 Clicks" Mobility  Outcome Measure Help needed turning from your back to your side while in a flat bed without using bedrails?: None Help needed moving from lying on your back to sitting on the side of a flat bed without using bedrails?: A Little Help needed moving to and from a bed to a chair (including a wheelchair)?: A Little Help needed standing up from a chair using your arms (e.g., wheelchair or bedside chair)?: A Little Help needed to walk in hospital room?: A Little Help needed climbing 3-5 steps with a railing? : A Lot 6 Click Score: 18    End of Session Equipment Utilized During Treatment: Gait belt Activity Tolerance: Patient tolerated treatment well Patient left: in chair;with call bell/phone within reach;with family/visitor present Nurse Communication: Mobility status PT Visit Diagnosis: Muscle weakness (generalized) (M62.81);Difficulty in walking, not elsewhere classified (R26.2);Pain Pain - Right/Left: Left Pain - part of body: Knee    Time:  1610-9604 PT Time Calculation (min) (ACUTE ONLY): 32 min   Charges:   PT Evaluation $PT Eval Low Complexity: 1 Low PT Treatments $Therapeutic Exercise: 8-22 mins        Aleda Grana, PT, DPT 03/19/23, 12:18 PM   Sandi Mariscal 03/19/2023, 12:16 PM

## 2023-03-19 NOTE — Progress Notes (Signed)
Physical Therapy Treatment Patient Details Name: [name] MRN: [id number] DOB: 1933/05/26 Today's Date: 03/19/2023   History of Present Illness Pt is an 89 y/o F admitted on 03/18/23 for L TKA revision. PMH: anxiety, aortic stenosis, CHF, CKD, dyspnea, GERD, heart murmur, HTN, hyperthyroidism, B THA    PT Comments    Pt seen for PT tx with pt agreeable but endorsing more pain compared to AM session. Pt requires min assist for supine>sit during this session, CGA<>supervision for STS from EOB, but is able to increase gait distance with RW & CGA. Recommend ongoing PT tx to maximize independence with mobility & reduce fall risk.    Recommendations for follow up therapy are one component of a multi-disciplinary discharge planning process, led by the attending physician.  Recommendations may be updated based on patient status, additional functional criteria and insurance authorization.  Follow Up Recommendations       Assistance Recommended at Discharge Intermittent Supervision/Assistance  Patient can return home with the following A little help with walking and/or transfers;A little help with bathing/dressing/bathroom;Assistance with cooking/housework;Assist for transportation;Help with stairs or ramp for entrance   Equipment Recommendations  None recommended by PT    Recommendations for Other Services       Precautions / Restrictions Precautions Precautions: Fall Restrictions Weight Bearing Restrictions: Yes LLE Weight Bearing: Weight bearing as tolerated     Mobility  Bed Mobility Overal bed mobility: Needs Assistance Bed Mobility: Supine to Sit, Sit to Supine     Supine to sit: Min assist, HOB elevated (assistance to move LLE off EOB) Sit to supine: HOB elevated, Supervision (pt uses BUE to assist LLE onto bed)   General bed mobility comments: Uses UE to assist LLE to EOB    Transfers Overall transfer level: Needs assistance Equipment used: Rolling walker (2  wheels) Transfers: Sit to/from Stand Sit to Stand: Min guard, Supervision           General transfer comment: STS from EOB    Ambulation/Gait Ambulation/Gait assistance: Min guard Gait Distance (Feet): 90 Feet Assistive device: Rolling walker (2 wheels) Gait Pattern/deviations: Decreased step length - left, Decreased step length - right, Decreased stride length Gait velocity: decreased     General Gait Details: Pt pushes RW out in front o fher then steps to it, educated pt on progression of fluid gait & RW advancement.   Stairs             Wheelchair Mobility    Modified Rankin (Stroke Patients Only)       Balance Overall balance assessment: Needs assistance Sitting-balance support: Feet supported, Bilateral upper extremity supported, No upper extremity supported Sitting balance-Leahy Scale: Good     Standing balance support: During functional activity, Reliant on assistive device for balance, Bilateral upper extremity supported Standing balance-Leahy Scale: Fair                              Cognition Arousal/Alertness: Awake/alert Behavior During Therapy: WFL for tasks assessed/performed Overall Cognitive Status: Within Functional Limits for tasks assessed                                          Exercises Total Joint Exercises Ankle Circles/Pumps: AROM, Supine, Left, 10 reps Quad Sets: AROM, Supine, Strengthening, Left, 10 reps Towel Squeeze: AROM, Supine, Strengthening, 10 reps (hip adduction  squeezes) Short [employer]: AROM, Supine, Strengthening, Left, 10 reps Heel Slides: AROM, Supine, AAROM, Strengthening, 10 reps, Left Hip ABduction/ADduction: AROM, Supine, AAROM, Strengthening, Left, 10 reps (hip abduction slides) Straight Leg Raises: Supine, AAROM, Strengthening, Left, 10 reps Long Arc Quad: AROM, Seated, Strengthening, Left, 10 reps Knee Flexion: AROM, AAROM, Strengthening, Seated, Left, 20 reps    General  Comments General comments (skin integrity, edema, etc.): Pt c/o slight dizziness, BP sitting in recliner after gait 118/66 mmHg MAP 80, HR 51 bpm      Pertinent Vitals/Pain Pain Assessment Pain Assessment: 0-10 Pain Score: 8  Faces Pain Scale: Hurts little more Pain Location: L knee Pain Descriptors / Indicators: Discomfort, Grimacing, Guarding Pain Intervention(s): Monitored during session, Patient requesting pain meds-RN notified, Limited activity within patient's tolerance    Home Living Family/patient expects to be discharged to:: Private residence Living Arrangements: Alone Available Help at Discharge: Family Type of Home: House Home Access: Ramped entrance       Home Layout: Two level;Able to live on main level with bedroom/bathroom Home Equipment: Rollator (4 wheels);Rolling Walker (2 wheels);Cane - single point      Prior Function            PT Goals (current goals can now be found in the care plan section) Acute Rehab PT Goals Patient Stated Goal: stay another night to make sure she's definitely ready to go home tomorrow PT Goal Formulation: With patient Time For Goal Achievement: 04/02/23 Potential to Achieve Goals: Good Progress towards PT goals: Progressing toward goals    Frequency    7X/week      PT Plan Frequency needs to be updated    Co-evaluation              AM-PAC PT "6 Clicks" Mobility   Outcome Measure  Help needed turning from your back to your side while in a flat bed without using bedrails?: None Help needed moving from lying on your back to sitting on the side of a flat bed without using bedrails?: A Little Help needed moving to and from a bed to a chair (including a wheelchair)?: A Little Help needed standing up from a chair using your arms (e.g., wheelchair or bedside chair)?: A Little Help needed to walk in hospital room?: A Little Help needed climbing 3-5 steps with a railing? : A Lot 6 Click Score: 18    End of Session  Equipment Utilized During Treatment: Gait belt Activity Tolerance: Patient tolerated treatment well;Patient limited by fatigue;Patient limited by pain Patient left: in bed;with call bell/phone within reach;with family/visitor present Nurse Communication: Patient requests pain meds PT Visit Diagnosis: Muscle weakness (generalized) (M62.81);Difficulty in walking, not elsewhere classified (R26.2);Pain Pain - Right/Left: Left Pain - part of body: Knee     Time: 1610-9604 PT Time Calculation (min) (ACUTE ONLY): 10 min  Charges:  $Gait Training: 8-22 mins $Therapeutic Exercise: 8-22 mins                     Aleda Grana, PT, DPT 03/19/23, 1:45 PM   Sandi Mariscal 03/19/2023, 1:44 PM

## 2023-03-20 LAB — SURGICAL PATHOLOGY

## 2023-03-20 NOTE — Discharge Summary (Signed)
Patient ID: [name] MRN: [id number] DOB/AGE: 12-Mar-1933 89 y.o.  Admit date: 03/18/2023 Discharge date: 03/20/2023  Admission Diagnoses:  Principal Problem:   Loose left total knee arthroplasty [facility]) Active Problems:   S/P revision of total knee, left   Discharge Diagnoses:  Same  Past Medical History:  Diagnosis Date   Anxiety    Aortic stenosis    Echo 07/05/16: Very mild AS, Mean gradient 10 mm Hg, Peak gradient (S) 19 mmHg    Arthritis    left hip and back   Bronchiectasis    isolated to RML; status post right middle lobe partial resection.   CHF (congestive heart failure) (HCC)    Chronic kidney disease    Complication of anesthesia    hard to wake up   Dyspnea    Gall stones    GERD (gastroesophageal reflux disease)    history    H/O osteoporosis    Heart murmur    hasn't been heard lately   Hypertension    Hyperthyroidism    endocrinologist - Dr. Talmage Nap   Pneumonia    Yeast infection     Surgeries: Procedure(s): LEFT TOTAL KNEE REVISION on 03/18/2023   Consultants:   Discharged Condition: Improved  Hospital Course: [name] is an 89 y.o. female who was admitted 03/18/2023 for operative treatment ofLoose left total knee arthroplasty (HCC). Patient has severe unremitting pain that affects sleep, daily activities, and work/hobbies. After pre-op clearance the patient was taken to the operating room on 03/18/2023 and underwent  Procedure(s): LEFT TOTAL KNEE REVISION.    Patient was given perioperative antibiotics:  Anti-infectives (From admission, onward)    Start     Dose/Rate Route Frequency Ordered Stop   03/18/23 1930  ceFAZolin (ANCEF) IVPB 2g/100 mL premix        2 g 200 mL/hr over 30 Minutes Intravenous Every 6 hours 03/18/23 1835 03/19/23 0233   03/18/23 1015  ceFAZolin (ANCEF) IVPB 2g/100 mL premix  Status:  Discontinued        2 g 200 mL/hr over 30 Minutes Intravenous On call to O.R. 03/18/23 1014 03/18/23 1835        Patient was  given sequential compression devices, early ambulation, and chemoprophylaxis to prevent DVT.  Patient benefited maximally from hospital stay and there were no complications.    Recent vital signs: Patient Vitals for the past 24 hrs:  BP Temp Temp src Pulse Resp SpO2  03/20/23 0537 (!) 140/65 98.2 F (36.8 C) Oral (!) 58 17 100 %  03/19/23 2226 119/62 (!) 97.4 F (36.3 C) Oral (!) 52 18 97 %  03/19/23 1739 106/63 97.6 F (36.4 C) -- (!) 53 16 99 %  03/19/23 1343 (!) 107/58 97.6 F (36.4 C) -- (!) 54 18 97 %  03/19/23 0942 (!) 102/57 98 F (36.7 C) -- (!) 51 18 95 %     Recent laboratory studies: No results for input(s): "WBC", "HGB", "HCT", "PLT", "NA", "K", "CL", "CO2", "BUN", "CREATININE", "GLUCOSE", "INR", "CALCIUM" in the last 72 hours.  Invalid input(s): "PT", "2"   Discharge Medications:   Allergies as of 03/20/2023       Reactions   Ibuprofen Other (See Comments)   hallucinations Other reaction(s): hallucinations   Naproxen Sodium Other (See Comments)   hallucinations        Medication List     TAKE these medications    acetaminophen 650 MG CR tablet Commonly known as: TYLENOL Take 1,300 mg by mouth  every 8 (eight) hours as needed for pain.   aspirin EC 81 MG tablet Take 1 tablet (81 mg total) by mouth 2 (two) times daily. What changed:  when to take this additional instructions   busPIRone 5 MG tablet Commonly known as: BUSPAR TAKE 1 TABLET(5 MG) BY MOUTH DAILY AS NEEDED FOR ANXIETY   calcium carbonate 600 MG Tabs tablet Commonly known as: OS-CAL Take 600 mg by mouth daily.   cholecalciferol 25 MCG (1000 UNIT) tablet Commonly known as: VITAMIN D3 Take 1,000 Units by mouth daily.   diclofenac Sodium 1 % Gel Commonly known as: VOLTAREN APPLY 2 GRAMS 4 TIMES DAILY AS DIRECTED   famotidine 20 MG tablet Commonly known as: PEPCID TAKE 1 TABLET(20 MG) BY MOUTH DAILY   fenofibrate 145 MG tablet Commonly known as: TRICOR TAKE 1 TABLET BY MOUTH  EVERY DAY   Flaxseed Oil 1200 MG Caps Take 1,200 mg by mouth daily.   furosemide 20 MG tablet Commonly known as: LASIX TAKE 1 TABLET BY MOUTH ONCE DAILY   lidocaine 5 % Commonly known as: LIDODERM Place 1 patch onto the skin daily as needed (hip pain).   methimazole 5 MG tablet Commonly known as: TAPAZOLE Take 2.5 mg by mouth daily.   metoprolol succinate 25 MG 24 hr tablet Commonly known as: TOPROL-XL Take 1 tablet (25 mg total) by mouth daily.   nystatin cream Commonly known as: MYCOSTATIN APPLY TOPCIALLY TO AFFECTED AREA TWICE DAILY AS NEEDED   oxyCODONE-acetaminophen 5-325 MG tablet Commonly known as: PERCOCET/ROXICET Take 1 tablet by mouth every 4 (four) hours as needed for severe pain.   potassium chloride SA 20 MEQ tablet Commonly known as: KLOR-CON M TAKE 1 TABLET BY MOUTH EVERY DAY   tiZANidine 2 MG tablet Commonly known as: ZANAFLEX Take 1 tablet (2 mg total) by mouth every 6 (six) hours as needed for muscle spasms.   Vitamin C 500 MG Caps Take 500 mg by mouth daily.               Durable Medical Equipment  (From admission, onward)           Start     Ordered   03/18/23 1836  DME Walker rolling  Once       Question:  Patient needs a walker to treat with the following condition  Answer:  Status post total left knee replacement   03/18/23 1835   03/18/23 1836  DME 3 n 1  Once        03/18/23 1835              Discharge Care Instructions  (From admission, onward)           Start     Ordered   03/20/23 0000  Weight bearing as tolerated        03/20/23 0805            Diagnostic Studies: DG Chest 2 View  Result Date: 03/12/2023 CLINICAL DATA:  Preoperative evaluation for knee surgery, initial encounter EXAM: CHEST - 2 VIEW COMPARISON:  02/18/2020 FINDINGS: Cardiac shadow is within normal limits. Elevation of the right hemidiaphragm is again seen and stable. No focal infiltrate or effusion is noted. No bony abnormality is  seen. IMPRESSION: No active cardiopulmonary disease. Electronically Signed   By: Alcide Clever M.D.   On: 03/12/2023 03:34    Disposition: Discharge disposition: 01-Home or Self Care       Discharge Instructions     Call  MD / Call 911   Complete by: As directed    If you experience chest pain or shortness of breath, CALL 911 and be transported to the hospital emergency room.  If you develope a fever above 101 F, pus (white drainage) or increased drainage or redness at the wound, or calf pain, call your surgeon's office.   Constipation Prevention   Complete by: As directed    Drink plenty of fluids.  Prune juice may be helpful.  You may use a stool softener, such as Colace (over the counter) 100 mg twice a day.  Use MiraLax (over the counter) for constipation as needed.   Diet - low sodium heart healthy   Complete by: As directed    Driving restrictions   Complete by: As directed    No driving for 2 weeks   Increase activity slowly as tolerated   Complete by: As directed    Patient may shower   Complete by: As directed    You may shower without a dressing once there is no drainage.  Do not wash over the wound.  If drainage remains, cover wound with plastic wrap and then shower.   Post-operative opioid taper instructions:   Complete by: As directed    POST-OPERATIVE OPIOID TAPER INSTRUCTIONS: It is important to wean off of your opioid medication as soon as possible. If you do not need pain medication after your surgery it is ok to stop day one. Opioids include: Codeine, Hydrocodone(Norco, Vicodin), Oxycodone(Percocet, oxycontin) and hydromorphone amongst others.  Long term and even short term use of opiods can cause: Increased pain response Dependence Constipation Depression Respiratory depression And more.  Withdrawal symptoms can include Flu like symptoms Nausea, vomiting And more Techniques to manage these symptoms Hydrate well Eat regular healthy meals Stay active Use  relaxation techniques(deep breathing, meditating, yoga) Do Not substitute Alcohol to help with tapering If you have been on opioids for less than two weeks and do not have pain than it is ok to stop all together.  Plan to wean off of opioids This plan should start within one week post op of your joint replacement. Maintain the same interval or time between taking each dose and first decrease the dose.  Cut the total daily intake of opioids by one tablet each day Next start to increase the time between doses. The last dose that should be eliminated is the evening dose.      Weight bearing as tolerated   Complete by: As directed         Follow-up Information     Gean Birchwood, MD Follow up in 2 week(s).   Specialty: Orthopedic Surgery Contact information: [address] [place] [address] [phone]         [facility] Follow up.   Specialty: Home Health Services Why: [facility] will provide PT in the home after discharge. Contact information: [address] [place] [address] [phone]                  Signed: Dannielle Burn 03/20/2023, 8:05 AM

## 2023-03-20 NOTE — Progress Notes (Signed)
PATIENT ID: [name]  MRN: [id number]  DOB/AGE:  Nov 15, 1932 / 89 y.o.  2 Days Post-Op Procedure(s) (LRB): LEFT TOTAL KNEE REVISION (Left)    PROGRESS NOTE Subjective: Patient is alert, oriented, no Nausea, no Vomiting, yes passing gas. Taking PO well. Denies SOB, Chest or Calf Pain. Using Incentive Spirometer, PAS in place. Ambulate WBAT with pt walking 90 ft with therapy, Patient reports pain as mild .    Objective: Vital signs in last 24 hours: Vitals:   03/19/23 1343 03/19/23 1739 03/19/23 2226 03/20/23 0537  BP: (!) 107/58 106/63 119/62 (!) 140/65  Pulse: (!) 54 (!) 53 (!) 52 (!) 58  Resp: 18 16 18 17   Temp: 97.6 F (36.4 C) 97.6 F (36.4 C) (!) 97.4 F (36.3 C) 98.2 F (36.8 C)  TempSrc:   Oral Oral  SpO2: 97% 99% 97% 100%  Weight:      Height:          Intake/Output from previous day: I/O last 3 completed shifts: In: 3322.4 [P.O.:1500; I.V.:1622.4; IV Piggyback:200] Out: 3950 [Urine:3950]   Intake/Output this shift: Total I/O In: 240 [P.O.:240] Out: 0    LABORATORY DATA: No results for input(s): "WBC", "HGB", "HCT", "PLT", "NA", "K", "CL", "CO2", "BUN", "CREATININE", "GLUCOSE", "GLUCAP", "INR", "CALCIUM" in the last 72 hours.  Invalid input(s): "PT", "2"  Examination: Neurologically intact Neurovascular intact Sensation intact distally Intact pulses distally Dorsiflexion/Plantar flexion intact Incision: dressing C/D/I and scant drainage No cellulitis present Compartment soft}  Assessment:   2 Days Post-Op Procedure(s) (LRB): LEFT TOTAL KNEE REVISION (Left) ADDITIONAL DIAGNOSIS: Expected Acute Blood Loss Anemia, anxiety, aortic stenosis, CHF, CKD, dyspnea, GERD, heart murmur, HTN, hyperthyroidism, B THA  Anticipated LOS equal to or greater than 2 midnights due to - Age 35 and older with one or more of the following:  - Obesity  - Expected need for hospital services (PT, OT, Nursing) required for safe discharge  - Anticipated need for postoperative  skilled nursing care or inpatient rehab    Plan: PT/OT WBAT, AROM and PROM  DVT Prophylaxis:  SCDx72hrs, ASA 81 mg BID x 2 weeks DISCHARGE PLAN: Home DISCHARGE NEEDS: HHPT, Walker, and 3-in-1 comode seat     Dannielle Burn 03/20/2023, 8:02 AM

## 2023-03-20 NOTE — Progress Notes (Signed)
Discharge package printed and instructions given to pt and daughter in law. No questions ask.

## 2023-03-20 NOTE — Progress Notes (Signed)
Physical Therapy Treatment Patient Details Name: [name] MRN: [id number] DOB: [birth year] Today's Date: 03/20/2023   History of Present Illness Pt is an 89 y/o F admitted on 03/18/23 for L TKA revision. PMH: anxiety, aortic stenosis, CHF, CKD, dyspnea, GERD, heart murmur, HTN, hyperthyroidism, B THA    PT Comments    POD # 2 am session General Comments: AxO x 3 very pleasant and motivated.  Great Family support. General transfer comment: good safety cognition and use of hands to steady self.  Also assisted to bathroom. General Gait Details: tolerated a functional distance amb to bathroom as well as in hallway.  VC's safety with turns. Pt has a RAMP, so did not practice any stairs.  Then returned to room to perform some TE's following HEP handout.  Instructed on proper tech, freq as well as use of ICE.   Addressed all mobility questions, discussed appropriate activity, educated on use of ICE.  Pt ready for D/C to home.   Recommendations for follow up therapy are one component of a multi-disciplinary discharge planning process, led by the attending physician.  Recommendations may be updated based on patient status, additional functional criteria and insurance authorization.  Follow Up Recommendations       Assistance Recommended at Discharge    Patient can return home with the following A little help with walking and/or transfers;A little help with bathing/dressing/bathroom;Assistance with cooking/housework;Assist for transportation;Help with stairs or ramp for entrance   Equipment Recommendations  None recommended by PT    Recommendations for Other Services       Precautions / Restrictions Precautions Precautions: Fall Precaution Comments: no pillow under knee Restrictions Weight Bearing Restrictions: No LLE Weight Bearing: Weight bearing as tolerated     Mobility  Bed Mobility               General bed mobility comments: OOB in recliner    Transfers Overall  transfer level: Needs assistance Equipment used: Rolling walker (2 wheels) Transfers: Sit to/from Stand Sit to Stand: Supervision           General transfer comment: good safety cognition and use of hands to steady self.  Also assisted to bathroom.    Ambulation/Gait Ambulation/Gait assistance: Supervision, Min guard Gait Distance (Feet): 65 Feet Assistive device: Rolling walker (2 wheels) Gait Pattern/deviations: Decreased step length - left, Decreased step length - right, Decreased stride length Gait velocity: decreased     General Gait Details: tolerated a functional distance amb to bathroom as well as in hallway.  VC's safety with turns.   Stairs             Wheelchair Mobility    Modified Rankin (Stroke Patients Only)       Balance                                            Cognition Arousal/Alertness: Awake/alert Behavior During Therapy: WFL for tasks assessed/performed Overall Cognitive Status: Within Functional Limits for tasks assessed                                 General Comments: AxO x 3 very pleasant and motivated.  Great Family support.        Exercises  Total Knee Replacement TE's following HEP handout 10 reps B LE ankle pumps 05  reps towel squeezes 05 reps knee presses 05 reps heel slides  05 reps SAQ's 05 reps SLR's 05 reps ABD Educated on use of gait belt to assist with TE's Followed by ICE     General Comments        Pertinent Vitals/Pain Pain Assessment Pain Assessment: 0-10 Pain Score: 4  Pain Location: L knee Pain Descriptors / Indicators: Discomfort, Grimacing, Guarding Pain Intervention(s): Monitored during session, Repositioned, Premedicated before session, Ice applied    Home Living                          Prior Function            PT Goals (current goals can now be found in the care plan section) Progress towards PT goals: Progressing toward goals     Frequency    7X/week      PT Plan Frequency needs to be updated    Co-evaluation              AM-PAC PT "6 Clicks" Mobility   Outcome Measure  Help needed turning from your back to your side while in a flat bed without using bedrails?: A Little Help needed moving from lying on your back to sitting on the side of a flat bed without using bedrails?: A Little Help needed moving to and from a bed to a chair (including a wheelchair)?: A Little Help needed standing up from a chair using your arms (e.g., wheelchair or bedside chair)?: A Little Help needed to walk in hospital room?: A Little Help needed climbing 3-5 steps with a railing? : A Little 6 Click Score: 18    End of Session Equipment Utilized During Treatment: Gait belt Activity Tolerance: Patient tolerated treatment well Patient left: in chair;with call bell/phone within reach;with family/visitor present Nurse Communication: Mobility status (pt ready for D/C to home) PT Visit Diagnosis: Muscle weakness (generalized) (M62.81);Difficulty in walking, not elsewhere classified (R26.2);Pain Pain - Right/Left: Left Pain - part of body: Knee     Time: 1610-9604 PT Time Calculation (min) (ACUTE ONLY): 27 min  Charges:  $Gait Training: 8-22 mins $Therapeutic Exercise: 8-22 mins                     Felecia Shelling  PTA Acute  Rehabilitation Services Office M-F          [phone]

## 2023-03-20 NOTE — Plan of Care (Signed)
  Problem: Activity: Goal: Risk for activity intolerance will decrease Outcome: Progressing   Problem: Pain Managment: Goal: General experience of comfort will improve Outcome: Progressing   Problem: Safety: Goal: Ability to remain free from injury will improve Outcome: Progressing   

## 2023-03-21 ENCOUNTER — Telehealth: Payer: Self-pay

## 2023-03-21 DIAGNOSIS — Z96643 Presence of artificial hip joint, bilateral: Secondary | ICD-10-CM | POA: Diagnosis not present

## 2023-03-21 DIAGNOSIS — I350 Nonrheumatic aortic (valve) stenosis: Secondary | ICD-10-CM | POA: Diagnosis not present

## 2023-03-21 DIAGNOSIS — T84033A Mechanical loosening of internal left knee prosthetic joint, initial encounter: Secondary | ICD-10-CM | POA: Diagnosis not present

## 2023-03-21 DIAGNOSIS — Z6835 Body mass index (BMI) 35.0-35.9, adult: Secondary | ICD-10-CM | POA: Diagnosis not present

## 2023-03-21 DIAGNOSIS — K219 Gastro-esophageal reflux disease without esophagitis: Secondary | ICD-10-CM | POA: Diagnosis not present

## 2023-03-21 DIAGNOSIS — J479 Bronchiectasis, uncomplicated: Secondary | ICD-10-CM | POA: Diagnosis not present

## 2023-03-21 DIAGNOSIS — F419 Anxiety disorder, unspecified: Secondary | ICD-10-CM | POA: Diagnosis not present

## 2023-03-21 DIAGNOSIS — M810 Age-related osteoporosis without current pathological fracture: Secondary | ICD-10-CM | POA: Diagnosis not present

## 2023-03-21 DIAGNOSIS — M479 Spondylosis, unspecified: Secondary | ICD-10-CM | POA: Diagnosis not present

## 2023-03-21 DIAGNOSIS — T84033D Mechanical loosening of internal left knee prosthetic joint, subsequent encounter: Secondary | ICD-10-CM | POA: Diagnosis not present

## 2023-03-21 DIAGNOSIS — N182 Chronic kidney disease, stage 2 (mild): Secondary | ICD-10-CM | POA: Diagnosis not present

## 2023-03-21 DIAGNOSIS — I739 Peripheral vascular disease, unspecified: Secondary | ICD-10-CM | POA: Diagnosis not present

## 2023-03-21 DIAGNOSIS — I5032 Chronic diastolic (congestive) heart failure: Secondary | ICD-10-CM | POA: Diagnosis not present

## 2023-03-21 DIAGNOSIS — I130 Hypertensive heart and chronic kidney disease with heart failure and stage 1 through stage 4 chronic kidney disease, or unspecified chronic kidney disease: Secondary | ICD-10-CM | POA: Diagnosis not present

## 2023-03-21 DIAGNOSIS — Z8701 Personal history of pneumonia (recurrent): Secondary | ICD-10-CM | POA: Diagnosis not present

## 2023-03-21 DIAGNOSIS — E7800 Pure hypercholesterolemia, unspecified: Secondary | ICD-10-CM | POA: Diagnosis not present

## 2023-03-21 DIAGNOSIS — I700 Atherosclerosis of aorta: Secondary | ICD-10-CM | POA: Diagnosis not present

## 2023-03-21 DIAGNOSIS — Z96652 Presence of left artificial knee joint: Secondary | ICD-10-CM | POA: Diagnosis not present

## 2023-03-21 DIAGNOSIS — Z7902 Long term (current) use of antithrombotics/antiplatelets: Secondary | ICD-10-CM | POA: Diagnosis not present

## 2023-03-21 DIAGNOSIS — E0590 Thyrotoxicosis, unspecified without thyrotoxic crisis or storm: Secondary | ICD-10-CM | POA: Diagnosis not present

## 2023-03-21 DIAGNOSIS — E669 Obesity, unspecified: Secondary | ICD-10-CM | POA: Diagnosis not present

## 2023-03-21 NOTE — Transitions of Care (Post Inpatient/ED Visit) (Signed)
   03/21/2023  Name: [name] MRN: [id number] DOB: 1933-06-22  Today's TOC FU Call Status: Today's TOC FU Call Status:: Unsuccessul Call (1st Attempt) Unsuccessful Call (1st Attempt) Date: 03/21/23  Attempted to reach the patient regarding the most recent Inpatient/ED visit.  Follow Up Plan: Additional outreach attempts will be made to reach the patient to complete the Transitions of Care (Post Inpatient/ED visit) call.     Antionette Fairy, RN,BSN,CCM [facility] Care Management Care Management Community Coordinator Direct Phone: [phone] Toll Free: [phone] Fax: [phone]

## 2023-03-22 ENCOUNTER — Telehealth: Payer: Self-pay

## 2023-03-22 LAB — AEROBIC/ANAEROBIC CULTURE W GRAM STAIN (SURGICAL/DEEP WOUND)

## 2023-03-22 NOTE — Transitions of Care (Post Inpatient/ED Visit) (Signed)
03/22/2023  Name: [name] MRN: [id number] DOB: Oct 14, 1932  Today's TOC FU Call Status: Today's TOC FU Call Status:: Successful TOC FU Call Competed TOC FU Call Complete Date: 03/22/23  Transition Care Management Follow-up Telephone Call Date of Discharge: 03/20/23 Discharge Facility: [facility]) Type of Discharge: Inpatient Admission Primary Inpatient Discharge Diagnosis:: "s/p revision og totl knee,left" How have you been since you were released from the hospital?: Better (Pt reports she is "toughing it out"-trying not to take pain med often. Pain mgmt discussed. Pt last took pain med about 3hrs go. She has been up moving around. Appetite fair. BM today.) Any questions or concerns?: No  Items Reviewed: Did you receive and understand the discharge instructions provided?: Yes Medications obtained,verified, and reconciled?: Yes (Medications Reviewed) (Meds reviewed with daughter) Any new allergies since your discharge?: No Dietary orders reviewed?: Yes Type of Diet Ordered:: low salt/heart healthy Do you have support at home?: Yes People in Home: child(ren), adult Name of Support/Comfort Primary Source: daughter and son helping out  Medications Reviewed Today: Medications Reviewed Today     Reviewed by Charlyn Minerva, RN (Registered Nurse) on 03/22/23 at 1508  Med List Status: <None>   Medication Order Taking? Sig Documenting Provider Last Dose Status Informant  acetaminophen (TYLENOL) 650 MG CR tablet [id number] No Take 1,300 mg by mouth every 8 (eight) hours as needed for pain.  Patient not taking: Reported on 03/22/2023   [provider] Not Taking Active Self  Ascorbic Acid (VITAMIN C) 500 MG CAPS [id number] Yes Take 500 mg by mouth daily. [provider] Taking Active Self  aspirin EC 81 MG tablet [id number] Yes Take 1 tablet (81 mg total) by mouth 2 (two) times daily. Allena Katz, PA-C Taking Active   busPIRone (BUSPAR) 5 MG tablet  [id number] Yes TAKE 1 TABLET(5 MG) BY MOUTH DAILY AS NEEDED FOR ANXIETY [name], MD Taking Active Self  calcium carbonate (OS-CAL) 600 MG TABS tablet [id number] Yes Take 600 mg by mouth daily. [provider] Taking Active Self  cholecalciferol (VITAMIN D3) 25 MCG (1000 UT) tablet [id number] Yes Take 1,000 Units by mouth daily. [provider] Taking Active Self  diclofenac Sodium (VOLTAREN) 1 % GEL [id number] No APPLY 2 GRAMS 4 TIMES DAILY AS DIRECTED  Patient not taking: Reported on 03/05/2023   [name], MD Not Taking Active Self  famotidine (PEPCID) 20 MG tablet [id number] No TAKE 1 TABLET(20 MG) BY MOUTH DAILY  Patient not taking: Reported on 03/05/2023   [name], MD Not Taking Active Self  fenofibrate (TRICOR) 145 MG tablet [id number] Yes TAKE 1 TABLET BY MOUTH EVERY DAY [name], MD Taking Active Self  Flaxseed, Linseed, (FLAXSEED OIL) 1200 MG CAPS [id number] Yes Take 1,200 mg by mouth daily. [provider] Taking Active Self  furosemide (LASIX) 20 MG tablet [id number] Yes TAKE 1 TABLET BY MOUTH ONCE DAILY [name], MD Taking Active Self  lidocaine (LIDODERM) 5 % [id number] No Place 1 patch onto the skin daily as needed (hip pain).  Patient not taking: Reported on 03/22/2023   [name], MD Not Taking Active Self  methimazole (TAPAZOLE) 5 MG tablet [id number] Yes Take 2.5 mg by mouth daily.  [provider] Taking Active Self  metoprolol succinate (TOPROL-XL) 25 MG 24 hr tablet [id number] Yes Take 1 tablet (25 mg total) by mouth daily. [name], MD Taking Active Self  nystatin cream (MYCOSTATIN) [id number] Yes APPLY TOPCIALLY TO AFFECTED AREA TWICE DAILY AS NEEDED  [name], MD Taking Active   oxyCODONE-acetaminophen (PERCOCET/ROXICET) 5-325 MG tablet [id number] Yes Take 1 tablet by mouth every 4 (four) hours as needed for severe pain. Allena Katz, PA-C Taking Active   potassium chloride SA (KLOR-CON M) 20 MEQ tablet  [id number] Yes TAKE 1 TABLET BY MOUTH EVERY DAY [name], MD Taking Active Self  tiZANidine (ZANAFLEX) 2 MG tablet [id number] Yes Take 1 tablet (2 mg total) by mouth every 6 (six) hours as needed for muscle spasms. Allena Katz, PA-C Taking Active             Home Care and Equipment/Supplies: Were Home Health Services Ordered?: Yes Name of Home Health Agency:: [facility] Has Agency set up a time to come to your home?: Yes First Home Health Visit Date: 03/21/23 Any new equipment or medical supplies ordered?: NA  Functional Questionnaire: Do you need assistance with bathing/showering or dressing?: Yes Do you need assistance with meal preparation?: Yes Do you need assistance with eating?: No Do you have difficulty maintaining continence: No Do you need assistance with getting out of bed/getting out of a chair/moving?: Yes Do you have difficulty managing or taking your medications?: No  Follow up appointments reviewed: PCP Follow-up appointment confirmed?: NA Specialist Hospital Follow-up appointment confirmed?: Yes Date of Specialist follow-up appointment?: 03/28/23 Follow-Up Specialty Provider:: Dr. Turner Daniels Do you need transportation to your follow-up appointment?: No (family confirms they are able to get pt to appt) Do you understand care options if your condition(s) worsen?: Yes-patient verbalized understanding  SDOH Interventions Today    Flowsheet Row Most Recent Value  SDOH Interventions   Food Insecurity Interventions Intervention Not Indicated  Transportation Interventions Intervention Not Indicated       TOC Interventions Today    Flowsheet Row Most Recent Value  TOC Interventions   TOC Interventions Discussed/Reviewed TOC Interventions Discussed, Post discharge activity limitations per provider, Post op wound/incision care, S/S of infection      . Interventions Today    Flowsheet Row Most Recent Value  General Interventions   General Interventions  Discussed/Reviewed General Interventions Discussed, Doctor Visits  Doctor Visits Discussed/Reviewed Doctor Visits Discussed, Specialist, PCP  PCP/Specialist Visits Compliance with follow-up visit  Education Interventions   Education Provided Provided Education  Provided Verbal Education On When to see the doctor, Medication, Nutrition, Other  [pain mgmt/bowel regimen]  Nutrition Interventions   Nutrition Discussed/Reviewed Nutrition Discussed, Adding fruits and vegetables, Decreasing salt, Fluid intake  Pharmacy Interventions   Pharmacy Dicussed/Reviewed Pharmacy Topics Discussed, Medications and their functions  Safety Interventions   Safety Discussed/Reviewed Safety Discussed, Home Safety  Home Safety Assistive Devices       [place], [facility] Care Management Care Management Community Coordinator Direct Phone: [phone] Toll Free: [phone] Fax: [phone]

## 2023-03-22 NOTE — Transitions of Care (Post Inpatient/ED Visit) (Signed)
   03/22/2023  Name: RANNA KNOEDLER MRN: [id number] DOB: 1933/04/22  Today's TOC FU Call Status: Today's TOC FU Call Status:: Unsuccessful Call (2nd Attempt) Unsuccessful Call (2nd Attempt) Date: 03/22/23  Attempted to reach the patient regarding the most recent Inpatient/ED visit.  Follow Up Plan: Additional outreach attempts will be made to reach the patient to complete the Transitions of Care (Post Inpatient/ED visit) call.     Antionette Fairy, RN,BSN,CCM [facility] Care Management Care Management Community Coordinator Direct Phone: [phone] Toll Free: [phone] Fax: [phone]

## 2023-03-23 DIAGNOSIS — N182 Chronic kidney disease, stage 2 (mild): Secondary | ICD-10-CM | POA: Diagnosis not present

## 2023-03-23 DIAGNOSIS — J479 Bronchiectasis, uncomplicated: Secondary | ICD-10-CM | POA: Diagnosis not present

## 2023-03-23 DIAGNOSIS — T84033D Mechanical loosening of internal left knee prosthetic joint, subsequent encounter: Secondary | ICD-10-CM | POA: Diagnosis not present

## 2023-03-23 DIAGNOSIS — I739 Peripheral vascular disease, unspecified: Secondary | ICD-10-CM | POA: Diagnosis not present

## 2023-03-23 DIAGNOSIS — I130 Hypertensive heart and chronic kidney disease with heart failure and stage 1 through stage 4 chronic kidney disease, or unspecified chronic kidney disease: Secondary | ICD-10-CM | POA: Diagnosis not present

## 2023-03-23 DIAGNOSIS — I5032 Chronic diastolic (congestive) heart failure: Secondary | ICD-10-CM | POA: Diagnosis not present

## 2023-03-23 LAB — AEROBIC/ANAEROBIC CULTURE W GRAM STAIN (SURGICAL/DEEP WOUND)

## 2023-03-25 ENCOUNTER — Other Ambulatory Visit: Payer: Self-pay | Admitting: Internal Medicine

## 2023-03-25 DIAGNOSIS — T84033D Mechanical loosening of internal left knee prosthetic joint, subsequent encounter: Secondary | ICD-10-CM | POA: Diagnosis not present

## 2023-03-25 DIAGNOSIS — N182 Chronic kidney disease, stage 2 (mild): Secondary | ICD-10-CM | POA: Diagnosis not present

## 2023-03-25 DIAGNOSIS — I130 Hypertensive heart and chronic kidney disease with heart failure and stage 1 through stage 4 chronic kidney disease, or unspecified chronic kidney disease: Secondary | ICD-10-CM | POA: Diagnosis not present

## 2023-03-25 DIAGNOSIS — I5032 Chronic diastolic (congestive) heart failure: Secondary | ICD-10-CM | POA: Diagnosis not present

## 2023-03-25 DIAGNOSIS — J479 Bronchiectasis, uncomplicated: Secondary | ICD-10-CM | POA: Diagnosis not present

## 2023-03-25 DIAGNOSIS — I739 Peripheral vascular disease, unspecified: Secondary | ICD-10-CM | POA: Diagnosis not present

## 2023-03-26 DIAGNOSIS — I5032 Chronic diastolic (congestive) heart failure: Secondary | ICD-10-CM | POA: Diagnosis not present

## 2023-03-26 DIAGNOSIS — T84033D Mechanical loosening of internal left knee prosthetic joint, subsequent encounter: Secondary | ICD-10-CM | POA: Diagnosis not present

## 2023-03-26 DIAGNOSIS — I739 Peripheral vascular disease, unspecified: Secondary | ICD-10-CM | POA: Diagnosis not present

## 2023-03-26 DIAGNOSIS — N182 Chronic kidney disease, stage 2 (mild): Secondary | ICD-10-CM | POA: Diagnosis not present

## 2023-03-26 DIAGNOSIS — J479 Bronchiectasis, uncomplicated: Secondary | ICD-10-CM | POA: Diagnosis not present

## 2023-03-26 DIAGNOSIS — I130 Hypertensive heart and chronic kidney disease with heart failure and stage 1 through stage 4 chronic kidney disease, or unspecified chronic kidney disease: Secondary | ICD-10-CM | POA: Diagnosis not present

## 2023-03-27 DIAGNOSIS — T84033D Mechanical loosening of internal left knee prosthetic joint, subsequent encounter: Secondary | ICD-10-CM | POA: Diagnosis not present

## 2023-03-27 DIAGNOSIS — I130 Hypertensive heart and chronic kidney disease with heart failure and stage 1 through stage 4 chronic kidney disease, or unspecified chronic kidney disease: Secondary | ICD-10-CM | POA: Diagnosis not present

## 2023-03-27 DIAGNOSIS — I5032 Chronic diastolic (congestive) heart failure: Secondary | ICD-10-CM | POA: Diagnosis not present

## 2023-03-27 DIAGNOSIS — J479 Bronchiectasis, uncomplicated: Secondary | ICD-10-CM | POA: Diagnosis not present

## 2023-03-27 DIAGNOSIS — N182 Chronic kidney disease, stage 2 (mild): Secondary | ICD-10-CM | POA: Diagnosis not present

## 2023-03-27 DIAGNOSIS — I739 Peripheral vascular disease, unspecified: Secondary | ICD-10-CM | POA: Diagnosis not present

## 2023-03-28 DIAGNOSIS — M25562 Pain in left knee: Secondary | ICD-10-CM | POA: Diagnosis not present

## 2023-03-29 DIAGNOSIS — I5032 Chronic diastolic (congestive) heart failure: Secondary | ICD-10-CM | POA: Diagnosis not present

## 2023-03-29 DIAGNOSIS — T84033D Mechanical loosening of internal left knee prosthetic joint, subsequent encounter: Secondary | ICD-10-CM | POA: Diagnosis not present

## 2023-03-29 DIAGNOSIS — N182 Chronic kidney disease, stage 2 (mild): Secondary | ICD-10-CM | POA: Diagnosis not present

## 2023-03-29 DIAGNOSIS — I130 Hypertensive heart and chronic kidney disease with heart failure and stage 1 through stage 4 chronic kidney disease, or unspecified chronic kidney disease: Secondary | ICD-10-CM | POA: Diagnosis not present

## 2023-03-29 DIAGNOSIS — J479 Bronchiectasis, uncomplicated: Secondary | ICD-10-CM | POA: Diagnosis not present

## 2023-03-29 DIAGNOSIS — I739 Peripheral vascular disease, unspecified: Secondary | ICD-10-CM | POA: Diagnosis not present

## 2023-04-01 DIAGNOSIS — T84033D Mechanical loosening of internal left knee prosthetic joint, subsequent encounter: Secondary | ICD-10-CM | POA: Diagnosis not present

## 2023-04-01 DIAGNOSIS — M25662 Stiffness of left knee, not elsewhere classified: Secondary | ICD-10-CM | POA: Diagnosis not present

## 2023-04-01 DIAGNOSIS — R262 Difficulty in walking, not elsewhere classified: Secondary | ICD-10-CM | POA: Diagnosis not present

## 2023-04-04 DIAGNOSIS — R262 Difficulty in walking, not elsewhere classified: Secondary | ICD-10-CM | POA: Diagnosis not present

## 2023-04-04 DIAGNOSIS — T84033D Mechanical loosening of internal left knee prosthetic joint, subsequent encounter: Secondary | ICD-10-CM | POA: Diagnosis not present

## 2023-04-04 DIAGNOSIS — M25662 Stiffness of left knee, not elsewhere classified: Secondary | ICD-10-CM | POA: Diagnosis not present

## 2023-04-09 DIAGNOSIS — T84033D Mechanical loosening of internal left knee prosthetic joint, subsequent encounter: Secondary | ICD-10-CM | POA: Diagnosis not present

## 2023-04-09 DIAGNOSIS — R262 Difficulty in walking, not elsewhere classified: Secondary | ICD-10-CM | POA: Diagnosis not present

## 2023-04-09 DIAGNOSIS — M25662 Stiffness of left knee, not elsewhere classified: Secondary | ICD-10-CM | POA: Diagnosis not present

## 2023-04-12 DIAGNOSIS — T84033D Mechanical loosening of internal left knee prosthetic joint, subsequent encounter: Secondary | ICD-10-CM | POA: Diagnosis not present

## 2023-04-12 DIAGNOSIS — M25662 Stiffness of left knee, not elsewhere classified: Secondary | ICD-10-CM | POA: Diagnosis not present

## 2023-04-12 DIAGNOSIS — R262 Difficulty in walking, not elsewhere classified: Secondary | ICD-10-CM | POA: Diagnosis not present

## 2023-04-16 DIAGNOSIS — M25662 Stiffness of left knee, not elsewhere classified: Secondary | ICD-10-CM | POA: Diagnosis not present

## 2023-04-16 DIAGNOSIS — M25572 Pain in left ankle and joints of left foot: Secondary | ICD-10-CM | POA: Diagnosis not present

## 2023-04-16 DIAGNOSIS — T84033D Mechanical loosening of internal left knee prosthetic joint, subsequent encounter: Secondary | ICD-10-CM | POA: Diagnosis not present

## 2023-04-16 DIAGNOSIS — R262 Difficulty in walking, not elsewhere classified: Secondary | ICD-10-CM | POA: Diagnosis not present

## 2023-04-18 DIAGNOSIS — M25662 Stiffness of left knee, not elsewhere classified: Secondary | ICD-10-CM | POA: Diagnosis not present

## 2023-04-18 DIAGNOSIS — T84033D Mechanical loosening of internal left knee prosthetic joint, subsequent encounter: Secondary | ICD-10-CM | POA: Diagnosis not present

## 2023-04-18 DIAGNOSIS — R262 Difficulty in walking, not elsewhere classified: Secondary | ICD-10-CM | POA: Diagnosis not present

## 2023-04-19 ENCOUNTER — Encounter: Payer: Self-pay | Admitting: Family Medicine

## 2023-04-19 ENCOUNTER — Ambulatory Visit (INDEPENDENT_AMBULATORY_CARE_PROVIDER_SITE_OTHER): Payer: Medicare Other | Admitting: Family Medicine

## 2023-04-19 VITALS — BP 120/60 | HR 63 | Temp 98.3°F | Ht 60.8 in | Wt 191.0 lb

## 2023-04-19 DIAGNOSIS — E611 Iron deficiency: Secondary | ICD-10-CM | POA: Diagnosis not present

## 2023-04-19 DIAGNOSIS — Z96652 Presence of left artificial knee joint: Secondary | ICD-10-CM

## 2023-04-19 DIAGNOSIS — R6883 Chills (without fever): Secondary | ICD-10-CM | POA: Diagnosis not present

## 2023-04-19 NOTE — Progress Notes (Signed)
I,Jameka J Llittleton, CMA,acting as a Neurosurgeon for [employer], NP.,have documented all relevant documentation on the behalf of Tiny Rietz, NP,as directed by  Zeus Marquis Moshe Salisbury, NP while in the presence of Glennette Galster, NP.  Subjective:  Patient ID: [name] , female    DOB: 29-Aug-1933 , 89 y.o.   MRN: [id number]  Chief Complaint  Patient presents with   Chills    HPI  Patient presents today for chills. She thinks her iron is low. She reports this started about 4 weeks ago after having knee replacement on her left knee. She wants to check her iron levels because she will be having Cataract eye surgery on 04/24/2023.     Past Medical History:  Diagnosis Date   Anxiety    Aortic stenosis    Echo 07/05/16: Very mild AS, Mean gradient 10 mm Hg, Peak gradient (S) 19 mmHg    Arthritis    left hip and back   Bronchiectasis    isolated to RML; status post right middle lobe partial resection.   CHF (congestive heart failure) (HCC)    Chronic kidney disease    Complication of anesthesia    hard to wake up   Dyspnea    Gall stones    GERD (gastroesophageal reflux disease)    history    H/O osteoporosis    Heart murmur    hasn't been heard lately   Hypertension    Hyperthyroidism    endocrinologist - Dr. Talmage Nap   Pneumonia    Yeast infection      Family History  Problem Relation Age of Onset   Hypertension Mother    CVA Mother 70   Heart attack Sister    Diabetes Sister    Healthy Father    Heart attack Brother    Heart attack Brother      Current Outpatient Medications:    Ascorbic Acid (VITAMIN C) 500 MG CAPS, Take 500 mg by mouth daily., Disp: , Rfl:    aspirin EC 81 MG tablet, Take 1 tablet (81 mg total) by mouth 2 (two) times daily., Disp: 60 tablet, Rfl: 0   busPIRone (BUSPAR) 5 MG tablet, TAKE 1 TABLET(5 MG) BY MOUTH DAILY AS NEEDED FOR ANXIETY, Disp: 90 tablet, Rfl: 1   calcium carbonate (OS-CAL) 600 MG TABS tablet, Take 600 mg by mouth daily., Disp: , Rfl:     cholecalciferol (VITAMIN D3) 25 MCG (1000 UT) tablet, Take 1,000 Units by mouth daily., Disp: , Rfl:    diclofenac Sodium (VOLTAREN) 1 % GEL, APPLY 2 GRAMS 4 TIMES DAILY AS DIRECTED, Disp: 500 g, Rfl: 1   famotidine (PEPCID) 20 MG tablet, TAKE 1 TABLET(20 MG) BY MOUTH DAILY, Disp: 90 tablet, Rfl: 1   fenofibrate (TRICOR) 145 MG tablet, TAKE 1 TABLET BY MOUTH EVERY DAY, Disp: 90 tablet, Rfl: 3   Flaxseed, Linseed, (FLAXSEED OIL) 1200 MG CAPS, Take 1,200 mg by mouth daily., Disp: , Rfl:    furosemide (LASIX) 20 MG tablet, TAKE 1 TABLET BY MOUTH ONCE DAILY, Disp: 90 tablet, Rfl: 3   lidocaine (LIDODERM) 5 %, Place 1 patch onto the skin daily as needed (hip pain)., Disp: 90 patch, Rfl: 1   methimazole (TAPAZOLE) 5 MG tablet, Take 2.5 mg by mouth daily. , Disp: , Rfl:    metoprolol succinate (TOPROL-XL) 25 MG 24 hr tablet, Take 1 tablet (25 mg total) by mouth daily., Disp: 90 tablet, Rfl: 3   nystatin cream (MYCOSTATIN), APPLY TOPCIALLY TO AFFECTED AREA  TWICE DAILY AS NEEDED, Disp: 30 g, Rfl: 1   oxyCODONE-acetaminophen (PERCOCET/ROXICET) 5-325 MG tablet, Take 1 tablet by mouth every 4 (four) hours as needed for severe pain., Disp: 30 tablet, Rfl: 0   potassium chloride SA (KLOR-CON M) 20 MEQ tablet, TAKE 1 TABLET BY MOUTH EVERY DAY, Disp: 90 tablet, Rfl: 3   tiZANidine (ZANAFLEX) 2 MG tablet, Take 1 tablet (2 mg total) by mouth every 6 (six) hours as needed for muscle spasms., Disp: 60 tablet, Rfl: 0   acetaminophen (TYLENOL) 650 MG CR tablet, Take 1,300 mg by mouth every 8 (eight) hours as needed for pain. (Patient not taking: Reported on 04/19/2023), Disp: , Rfl:    Allergies  Allergen Reactions   Ibuprofen Other (See Comments)    hallucinations Other reaction(s): hallucinations   Naproxen Sodium Other (See Comments)    hallucinations     Review of Systems  Constitutional:  Positive for chills. Negative for appetite change and fever.  HENT: Negative.    Eyes: Negative.   Respiratory:  Negative.    Gastrointestinal: Negative.   Endocrine: Negative.   Genitourinary: Negative.   Musculoskeletal:  Positive for gait problem and joint swelling.       Had knee replacement surgery 1 month ago  Psychiatric/Behavioral: Negative.       Today's Vitals   04/19/23 0841  BP: 120/60  Pulse: 63  Temp: 98.3 F (36.8 C)  Weight: 191 lb (86.6 kg)  Height: 5' 0.8" (1.544 m)  PainSc: 3   PainLoc: Leg   Body mass index is 36.33 kg/m.  Wt Readings from Last 3 Encounters:  04/19/23 191 lb (86.6 kg)  03/18/23 187 lb (84.8 kg)  03/07/23 187 lb (84.8 kg)     Objective:  Physical Exam Cardiovascular:     Rate and Rhythm: Normal rate.  Musculoskeletal:     Left knee: Decreased range of motion. Tenderness present.  Skin:    General: Skin is warm and dry.  Neurological:     Mental Status: She is alert and oriented to person, place, and time.  Psychiatric:        Mood and Affect: Mood normal.        Behavior: Behavior normal.         Assessment And Plan:  Chills Assessment & Plan: Possible anemia   Status post total left knee replacement Assessment & Plan: Had surgery about 1 month ago   Iron deficiency Assessment & Plan: Check Labs  Orders: -     Iron, TIBC and Ferritin Panel -     CBC -     BMP8+eGFR     Return if symptoms worsen or fail to improve.  Patient was given opportunity to ask questions. Patient verbalized understanding of the plan and was able to repeat key elements of the plan. All questions were answered to their satisfaction.  Kabir Brannock Moshe Salisbury, NP  I, Burleigh Brockmann Moshe Salisbury, NP, have reviewed all documentation for this visit. The documentation on 05/02/23 for the exam, diagnosis, procedures, and orders are all accurate and complete.   IF YOU HAVE BEEN REFERRED TO A SPECIALIST, IT MAY TAKE 1-2 WEEKS TO SCHEDULE/PROCESS THE REFERRAL. IF YOU HAVE NOT HEARD FROM US/SPECIALIST IN TWO WEEKS, PLEASE GIVE [place] A CALL AT [phone] X [phone].   THE PATIENT IS  ENCOURAGED TO PRACTICE SOCIAL DISTANCING DUE TO THE COVID-19 PANDEMIC.

## 2023-04-20 LAB — CBC
Hematocrit: 32.8 % — ABNORMAL LOW (ref 34.0–46.6)
Hemoglobin: 10.4 g/dL — ABNORMAL LOW (ref 11.1–15.9)
MCH: 25.5 pg — ABNORMAL LOW (ref 26.6–33.0)
MCHC: 31.7 g/dL (ref 31.5–35.7)
MCV: 80 fL (ref 79–97)
Platelets: 467 10*3/uL — ABNORMAL HIGH (ref 150–450)
RBC: 4.08 x10E6/uL (ref 3.77–5.28)
RDW: 14.2 % (ref 11.7–15.4)
WBC: 4 10*3/uL (ref 3.4–10.8)

## 2023-04-20 LAB — BMP8+EGFR
BUN/Creatinine Ratio: 18 (ref 12–28)
BUN: 12 mg/dL (ref 8–27)
CO2: 22 mmol/L (ref 20–29)
Calcium: 9.4 mg/dL (ref 8.7–10.3)
Chloride: 104 mmol/L (ref 96–106)
Creatinine, Ser: 0.67 mg/dL (ref 0.57–1.00)
Glucose: 88 mg/dL (ref 70–99)
Potassium: 4.3 mmol/L (ref 3.5–5.2)
Sodium: 139 mmol/L (ref 134–144)
eGFR: 85 mL/min/{1.73_m2} (ref >59)

## 2023-04-20 LAB — IRON,TIBC AND FERRITIN PANEL
Ferritin: 72 ng/mL (ref 15–150)
Iron Saturation: 8 % — CL (ref 15–55)
Iron: 35 ug/dL (ref 27–139)
Total Iron Binding Capacity: 460 ug/dL — ABNORMAL HIGH (ref 250–450)
UIBC: 425 ug/dL — ABNORMAL HIGH (ref 118–369)

## 2023-04-23 DIAGNOSIS — R262 Difficulty in walking, not elsewhere classified: Secondary | ICD-10-CM | POA: Diagnosis not present

## 2023-04-23 DIAGNOSIS — M25662 Stiffness of left knee, not elsewhere classified: Secondary | ICD-10-CM | POA: Diagnosis not present

## 2023-04-23 DIAGNOSIS — T84033D Mechanical loosening of internal left knee prosthetic joint, subsequent encounter: Secondary | ICD-10-CM | POA: Diagnosis not present

## 2023-04-25 DIAGNOSIS — T84033D Mechanical loosening of internal left knee prosthetic joint, subsequent encounter: Secondary | ICD-10-CM | POA: Diagnosis not present

## 2023-04-25 DIAGNOSIS — R262 Difficulty in walking, not elsewhere classified: Secondary | ICD-10-CM | POA: Diagnosis not present

## 2023-04-25 DIAGNOSIS — M25662 Stiffness of left knee, not elsewhere classified: Secondary | ICD-10-CM | POA: Diagnosis not present

## 2023-04-26 DIAGNOSIS — H2511 Age-related nuclear cataract, right eye: Secondary | ICD-10-CM | POA: Diagnosis not present

## 2023-04-26 DIAGNOSIS — H2512 Age-related nuclear cataract, left eye: Secondary | ICD-10-CM | POA: Diagnosis not present

## 2023-04-30 DIAGNOSIS — R262 Difficulty in walking, not elsewhere classified: Secondary | ICD-10-CM | POA: Diagnosis not present

## 2023-04-30 DIAGNOSIS — T84033D Mechanical loosening of internal left knee prosthetic joint, subsequent encounter: Secondary | ICD-10-CM | POA: Diagnosis not present

## 2023-04-30 DIAGNOSIS — M25662 Stiffness of left knee, not elsewhere classified: Secondary | ICD-10-CM | POA: Diagnosis not present

## 2023-05-02 DIAGNOSIS — M25662 Stiffness of left knee, not elsewhere classified: Secondary | ICD-10-CM | POA: Diagnosis not present

## 2023-05-02 DIAGNOSIS — R6883 Chills (without fever): Secondary | ICD-10-CM | POA: Insufficient documentation

## 2023-05-02 DIAGNOSIS — R262 Difficulty in walking, not elsewhere classified: Secondary | ICD-10-CM | POA: Diagnosis not present

## 2023-05-02 DIAGNOSIS — E611 Iron deficiency: Secondary | ICD-10-CM | POA: Insufficient documentation

## 2023-05-02 DIAGNOSIS — T84033D Mechanical loosening of internal left knee prosthetic joint, subsequent encounter: Secondary | ICD-10-CM | POA: Diagnosis not present

## 2023-05-02 NOTE — Assessment & Plan Note (Addendum)
Check Labs. 

## 2023-05-02 NOTE — Assessment & Plan Note (Signed)
Had surgery about 1 month ago

## 2023-05-02 NOTE — Assessment & Plan Note (Addendum)
Possible anemia

## 2023-05-07 DIAGNOSIS — R262 Difficulty in walking, not elsewhere classified: Secondary | ICD-10-CM | POA: Diagnosis not present

## 2023-05-07 DIAGNOSIS — T84033D Mechanical loosening of internal left knee prosthetic joint, subsequent encounter: Secondary | ICD-10-CM | POA: Diagnosis not present

## 2023-05-07 DIAGNOSIS — M25662 Stiffness of left knee, not elsewhere classified: Secondary | ICD-10-CM | POA: Diagnosis not present

## 2023-05-09 DIAGNOSIS — M25662 Stiffness of left knee, not elsewhere classified: Secondary | ICD-10-CM | POA: Diagnosis not present

## 2023-05-09 DIAGNOSIS — T84033D Mechanical loosening of internal left knee prosthetic joint, subsequent encounter: Secondary | ICD-10-CM | POA: Diagnosis not present

## 2023-05-09 DIAGNOSIS — R262 Difficulty in walking, not elsewhere classified: Secondary | ICD-10-CM | POA: Diagnosis not present

## 2023-05-10 DIAGNOSIS — H2511 Age-related nuclear cataract, right eye: Secondary | ICD-10-CM | POA: Diagnosis not present

## 2023-05-10 DIAGNOSIS — Z961 Presence of intraocular lens: Secondary | ICD-10-CM | POA: Diagnosis not present

## 2023-05-14 DIAGNOSIS — M25662 Stiffness of left knee, not elsewhere classified: Secondary | ICD-10-CM | POA: Diagnosis not present

## 2023-05-14 DIAGNOSIS — T84033D Mechanical loosening of internal left knee prosthetic joint, subsequent encounter: Secondary | ICD-10-CM | POA: Diagnosis not present

## 2023-05-14 DIAGNOSIS — R262 Difficulty in walking, not elsewhere classified: Secondary | ICD-10-CM | POA: Diagnosis not present

## 2023-05-16 DIAGNOSIS — M25662 Stiffness of left knee, not elsewhere classified: Secondary | ICD-10-CM | POA: Diagnosis not present

## 2023-05-16 DIAGNOSIS — R262 Difficulty in walking, not elsewhere classified: Secondary | ICD-10-CM | POA: Diagnosis not present

## 2023-05-16 DIAGNOSIS — T84033D Mechanical loosening of internal left knee prosthetic joint, subsequent encounter: Secondary | ICD-10-CM | POA: Diagnosis not present

## 2023-05-21 DIAGNOSIS — T84033D Mechanical loosening of internal left knee prosthetic joint, subsequent encounter: Secondary | ICD-10-CM | POA: Diagnosis not present

## 2023-05-21 DIAGNOSIS — R262 Difficulty in walking, not elsewhere classified: Secondary | ICD-10-CM | POA: Diagnosis not present

## 2023-05-21 DIAGNOSIS — M25662 Stiffness of left knee, not elsewhere classified: Secondary | ICD-10-CM | POA: Diagnosis not present

## 2023-05-23 DIAGNOSIS — M25662 Stiffness of left knee, not elsewhere classified: Secondary | ICD-10-CM | POA: Diagnosis not present

## 2023-05-23 DIAGNOSIS — T84033D Mechanical loosening of internal left knee prosthetic joint, subsequent encounter: Secondary | ICD-10-CM | POA: Diagnosis not present

## 2023-05-23 DIAGNOSIS — R262 Difficulty in walking, not elsewhere classified: Secondary | ICD-10-CM | POA: Diagnosis not present

## 2023-05-28 DIAGNOSIS — T84033D Mechanical loosening of internal left knee prosthetic joint, subsequent encounter: Secondary | ICD-10-CM | POA: Diagnosis not present

## 2023-05-28 DIAGNOSIS — R262 Difficulty in walking, not elsewhere classified: Secondary | ICD-10-CM | POA: Diagnosis not present

## 2023-05-28 DIAGNOSIS — M25662 Stiffness of left knee, not elsewhere classified: Secondary | ICD-10-CM | POA: Diagnosis not present

## 2023-05-30 DIAGNOSIS — T84033D Mechanical loosening of internal left knee prosthetic joint, subsequent encounter: Secondary | ICD-10-CM | POA: Diagnosis not present

## 2023-05-30 DIAGNOSIS — M25662 Stiffness of left knee, not elsewhere classified: Secondary | ICD-10-CM | POA: Diagnosis not present

## 2023-05-30 DIAGNOSIS — R262 Difficulty in walking, not elsewhere classified: Secondary | ICD-10-CM | POA: Diagnosis not present

## 2023-06-04 DIAGNOSIS — E049 Nontoxic goiter, unspecified: Secondary | ICD-10-CM | POA: Diagnosis not present

## 2023-06-04 DIAGNOSIS — I10 Essential (primary) hypertension: Secondary | ICD-10-CM | POA: Diagnosis not present

## 2023-06-04 DIAGNOSIS — R748 Abnormal levels of other serum enzymes: Secondary | ICD-10-CM | POA: Diagnosis not present

## 2023-06-04 DIAGNOSIS — E0590 Thyrotoxicosis, unspecified without thyrotoxic crisis or storm: Secondary | ICD-10-CM | POA: Diagnosis not present

## 2023-08-21 DIAGNOSIS — R350 Frequency of micturition: Secondary | ICD-10-CM | POA: Diagnosis not present

## 2023-08-21 DIAGNOSIS — N3946 Mixed incontinence: Secondary | ICD-10-CM | POA: Diagnosis not present

## 2023-08-21 DIAGNOSIS — R351 Nocturia: Secondary | ICD-10-CM | POA: Diagnosis not present

## 2023-08-28 ENCOUNTER — Encounter: Payer: Self-pay | Admitting: Internal Medicine

## 2023-08-28 ENCOUNTER — Telehealth: Payer: Self-pay | Admitting: *Deleted

## 2023-08-28 ENCOUNTER — Ambulatory Visit (INDEPENDENT_AMBULATORY_CARE_PROVIDER_SITE_OTHER): Payer: Medicare Other | Admitting: Internal Medicine

## 2023-08-28 ENCOUNTER — Ambulatory Visit (INDEPENDENT_AMBULATORY_CARE_PROVIDER_SITE_OTHER): Payer: Medicare Other

## 2023-08-28 VITALS — BP 112/60 | HR 79 | Temp 97.6°F | Ht 61.8 in | Wt 192.8 lb

## 2023-08-28 VITALS — BP 112/60 | HR 79 | Temp 97.6°F | Ht 61.0 in | Wt 192.0 lb

## 2023-08-28 DIAGNOSIS — E66812 Obesity, class 2: Secondary | ICD-10-CM

## 2023-08-28 DIAGNOSIS — M816 Localized osteoporosis [Lequesne]: Secondary | ICD-10-CM | POA: Diagnosis not present

## 2023-08-28 DIAGNOSIS — Z6836 Body mass index (BMI) 36.0-36.9, adult: Secondary | ICD-10-CM | POA: Diagnosis not present

## 2023-08-28 DIAGNOSIS — T466X5A Adverse effect of antihyperlipidemic and antiarteriosclerotic drugs, initial encounter: Secondary | ICD-10-CM

## 2023-08-28 DIAGNOSIS — I700 Atherosclerosis of aorta: Secondary | ICD-10-CM | POA: Diagnosis not present

## 2023-08-28 DIAGNOSIS — G720 Drug-induced myopathy: Secondary | ICD-10-CM | POA: Diagnosis not present

## 2023-08-28 DIAGNOSIS — N182 Chronic kidney disease, stage 2 (mild): Secondary | ICD-10-CM

## 2023-08-28 DIAGNOSIS — I5032 Chronic diastolic (congestive) heart failure: Secondary | ICD-10-CM | POA: Diagnosis not present

## 2023-08-28 DIAGNOSIS — Z0000 Encounter for general adult medical examination without abnormal findings: Secondary | ICD-10-CM

## 2023-08-28 DIAGNOSIS — D649 Anemia, unspecified: Secondary | ICD-10-CM

## 2023-08-28 DIAGNOSIS — I130 Hypertensive heart and chronic kidney disease with heart failure and stage 1 through stage 4 chronic kidney disease, or unspecified chronic kidney disease: Secondary | ICD-10-CM | POA: Diagnosis not present

## 2023-08-28 NOTE — Telephone Encounter (Signed)
Contacted regarding PREP Class referral. She is interested in participating at the [employer] location. We will call her back with class availability.

## 2023-08-28 NOTE — Progress Notes (Signed)
Subjective:   [name] is a 89 y.o. female who presents for Medicare Annual (Subsequent) preventive examination.  Visit Complete: In person    Cardiac Risk Factors include: advanced age (>48men, >42 women);hypertension     Objective:    Today's Vitals   08/28/23 0904  BP: 112/60  Pulse: 79  Temp: 97.6 F (36.4 C)  TempSrc: Oral  SpO2: 96%  Weight: 192 lb 12.8 oz (87.5 kg)  Height: 5' 1.8" (1.57 m)   Body mass index is 35.49 kg/m.     08/28/2023    9:18 AM 03/18/2023   10:33 AM 03/07/2023   10:45 AM 08/02/2022   12:34 PM 07/19/2021   11:18 AM 07/13/2020   11:47 AM 02/24/2020   10:00 PM  Advanced Directives  Does Patient Have a Medical Advance Directive? Yes Yes Yes Yes Yes Yes No  Type of Estate agent of [place];Living will Healthcare Power of [place];Living will  Healthcare Power of [place];Living will Healthcare Power of [place];Living will Healthcare Power of [place];Living will   Does patient want to make changes to medical advance directive?  No - Patient declined No - Patient declined      Copy of Healthcare Power of Attorney in Chart? No - copy requested No - copy requested  No - copy requested No - copy requested No - copy requested   Would patient like information on creating a medical advance directive?       No - Patient declined    Current Medications (verified) Outpatient Encounter Medications as of 08/28/2023  Medication Sig   acetaminophen (TYLENOL) 650 MG CR tablet Take 1,300 mg by mouth every 8 (eight) hours as needed for pain.   Ascorbic Acid (VITAMIN C) 500 MG CAPS Take 500 mg by mouth daily.   aspirin EC 81 MG tablet Take 1 tablet (81 mg total) by mouth 2 (two) times daily.   busPIRone (BUSPAR) 5 MG tablet TAKE 1 TABLET(5 MG) BY MOUTH DAILY AS NEEDED FOR ANXIETY   calcium carbonate (OS-CAL) 600 MG TABS tablet Take 600 mg by mouth daily.   cholecalciferol (VITAMIN D3) 25 MCG (1000 UT) tablet Take 1,000 Units by  mouth daily.   diclofenac Sodium (VOLTAREN) 1 % GEL APPLY 2 GRAMS 4 TIMES DAILY AS DIRECTED   fenofibrate (TRICOR) 145 MG tablet TAKE 1 TABLET BY MOUTH EVERY DAY   furosemide (LASIX) 20 MG tablet TAKE 1 TABLET BY MOUTH ONCE DAILY   lidocaine (LIDODERM) 5 % Place 1 patch onto the skin daily as needed (hip pain).   methimazole (TAPAZOLE) 5 MG tablet Take 2.5 mg by mouth daily.    metoprolol succinate (TOPROL-XL) 25 MG 24 hr tablet Take 1 tablet (25 mg total) by mouth daily.   nitrofurantoin (MACRODANTIN) 100 MG capsule Take 100 mg by mouth 2 (two) times daily.   nystatin cream (MYCOSTATIN) APPLY TOPCIALLY TO AFFECTED AREA TWICE DAILY AS NEEDED   oxybutynin (DITROPAN-XL) 10 MG 24 hr tablet Take 10 mg by mouth daily.   potassium chloride SA (KLOR-CON M) 20 MEQ tablet TAKE 1 TABLET BY MOUTH EVERY DAY   famotidine (PEPCID) 20 MG tablet TAKE 1 TABLET(20 MG) BY MOUTH DAILY (Patient not taking: Reported on 08/28/2023)   Flaxseed, Linseed, (FLAXSEED OIL) 1200 MG CAPS Take 1,200 mg by mouth daily. (Patient not taking: Reported on 08/28/2023)   oxyCODONE-acetaminophen (PERCOCET/ROXICET) 5-325 MG tablet Take 1 tablet by mouth every 4 (four) hours as needed for severe pain. (Patient not taking: Reported on  08/28/2023)   tiZANidine (ZANAFLEX) 2 MG tablet Take 1 tablet (2 mg total) by mouth every 6 (six) hours as needed for muscle spasms. (Patient not taking: Reported on 08/28/2023)   No facility-administered encounter medications on file as of 08/28/2023.    Allergies (verified) Ibuprofen and Naproxen sodium   History: Past Medical History:  Diagnosis Date   Anxiety    Aortic stenosis    Echo 07/05/16: Very mild AS, Mean gradient 10 mm Hg, Peak gradient (S) 19 mmHg    Arthritis    left hip and back   Bronchiectasis    isolated to RML; status post right middle lobe partial resection.   CHF (congestive heart failure) (HCC)    Chronic kidney disease    Complication of anesthesia    hard to wake up    Dyspnea    Gall stones    GERD (gastroesophageal reflux disease)    history    H/O osteoporosis    Heart murmur    hasn't been heard lately   Hypertension    Hyperthyroidism    endocrinologist - Dr. Talmage Nap   Pneumonia    Yeast infection    Past Surgical History:  Procedure Laterality Date   ABDOMINAL HYSTERECTOMY  1974   CHOLECYSTECTOMY N/A 07/02/2016   Procedure: LAPAROSCOPIC CHOLECYSTECTOMY;  Surgeon: Almond Lint, MD;  Location: [facility] OR;  Service: General;  Laterality: N/A;   COLONOSCOPY     ENDOSCOPIC RETROGRADE CHOLANGIOPANCREATOGRAPHY (ERCP) WITH PROPOFOL N/A 08/07/2018   Procedure: ENDOSCOPIC RETROGRADE CHOLANGIOPANCREATOGRAPHY (ERCP) WITH PROPOFOL;  Surgeon: Jeani Hawking, MD;  Location: [facility] ENDOSCOPY;  Service: Endoscopy;  Laterality: N/A;   EYE SURGERY     stye lanced   HARDWARE REMOVAL Left 02/24/2020   Procedure: Hardware Removal, left knee;  Surgeon: Eldred Manges, MD;  Location[facility] OR;  Service: Orthopedics;  Laterality: Left;   HERNIA REPAIR  1975   KNEE SURGERY Left    LUNG REMOVAL, PARTIAL  11/2006   RML   NM MYOVIEW LTD  06/2011   Normal EF. No ischemia or infarction.   SPHINCTEROTOMY  08/07/2018   Procedure: SPHINCTEROTOMY;  Surgeon: Jeani Hawking, MD;  Location: [facility];  Service: Endoscopy;;  balloon sweep   STERIOD INJECTION Right 02/24/2020   Procedure: RIGHT KNEE STEROID INTRA-ARTICULAR MARCAINE/DEPO-MEDROL INJECTION UNDER ANESTHESIA;  Surgeon: Eldred Manges, MD;  Location: [facility] OR;  Service: Orthopedics;  Laterality: Right;   TOTAL HIP ARTHROPLASTY Left 04/05/2017   Procedure: LEFT TOTAL HIP ARTHROPLASTY ANTERIOR APPROACH;  Surgeon: Eldred Manges, MD;  Location: [facility] OR;  Service: Orthopedics;  Laterality: Left;   TOTAL HIP ARTHROPLASTY Right 12/19/2018   Procedure: RIGHT TOTAL HIP ARTHROPLASTY-DIRECT ANTERIOR APPROACH;  Surgeon: Eldred Manges, MD;  Location: [facility] OR;  Service: Orthopedics;  Laterality: Right;   TOTAL KNEE ARTHROPLASTY Left 02/24/2020    Procedure: LEFT TOTAL KNEE ARTHROPLASTY;  Surgeon: Eldred Manges, MD;  Location: [facility] OR;  Service: Orthopedics;  Laterality: Left;   TOTAL KNEE REVISION Left 03/18/2023   Procedure: LEFT TOTAL KNEE REVISION;  Surgeon: Gean Birchwood, MD;  Location: [facility] ORS;  Service: Orthopedics;  Laterality: Left;   TRANSTHORACIC ECHOCARDIOGRAM  06/2011   Mild concentric LVH. Normal EF with impaired relaxation. Mildly elevated RV pressures of 30 and 40 mmHg.   TRANSTHORACIC ECHOCARDIOGRAM  06/2016   normal LV size and function. EF 60-65% with GRD 2 DD. Mild aortic stenosis. Mild LA dilation. Mild to moderately increased PA pressures (46 mmHg).   TUBAL LIGATION  Family History  Problem Relation Age of Onset   Hypertension Mother    CVA Mother 48   Heart attack Sister    Diabetes Sister    Healthy Father    Heart attack Brother    Heart attack Brother    Social History   Socioeconomic History   Marital status: Widowed    Spouse name: Not on file   Number of children: Not on file   Years of education: Not on file   Highest education level: Not on file  Occupational History   Occupation: retired  Tobacco Use   Smoking status: Never   Smokeless tobacco: Never  Vaping Use   Vaping status: Never Used  Substance and Sexual Activity   Alcohol use: No   Drug use: No   Sexual activity: Not Currently  Other Topics Concern   Not on file  Social History Narrative   She is a widowed mother of 4, grandmother for, a great-grandmother 4.   She has been trying to do more exercise than before, but has been injured recently by her back pain.   She never smoked, doesn't drink alcohol.   Social Determinants of Health   Financial Resource Strain: Low Risk  (08/28/2023)   Overall Financial Resource Strain (CARDIA)    Difficulty of Paying Living Expenses: Not hard at all  Food Insecurity: No Food Insecurity (08/28/2023)   Hunger Vital Sign    Worried About Running Out of Food in the Last Year: Never true     Ran Out of Food in the Last Year: Never true  Transportation Needs: No Transportation Needs (08/28/2023)   PRAPARE - Administrator, Civil Service (Medical): No    Lack of Transportation (Non-Medical): No  Physical Activity: Inactive (08/28/2023)   Exercise Vital Sign    Days of Exercise per Week: 0 days    Minutes of Exercise per Session: 0 min  Stress: Stress Concern Present (08/28/2023)   [employer] of Occupational Health - Occupational Stress Questionnaire    Feeling of Stress : To some extent  Social Connections: Moderately Isolated (08/28/2023)   Social Connection and Isolation Panel [NHANES]    Frequency of Communication with Friends and Family: More than three times a week    Frequency of Social Gatherings with Friends and Family: More than three times a week    Attends Religious Services: More than 4 times per year    Active Member of [employer] or Organizations: No    Attends Banker Meetings: Never    Marital Status: Widowed    Tobacco Counseling Counseling given: Not Answered   Clinical Intake:  Pre-visit preparation completed: Yes  Pain : No/denies pain     Nutritional Risks: None Diabetes: No  How often do you need to have someone help you when you read instructions, pamphlets, or other written materials from your doctor or pharmacy?: 1 - Never  Interpreter Needed?: No  Information entered by :: NAllen LPN   Activities of Daily Living    08/28/2023    9:06 AM 03/18/2023    6:55 PM  In your present state of health, do you have any difficulty performing the following activities:  Hearing? 0   Vision? 0   Difficulty concentrating or making decisions? 0   Walking or climbing stairs? 1   Comment uses a stair lift and ramp   Dressing or bathing? 0   Doing errands, shopping? 0 0  Preparing Food and eating ? N  Using the Toilet? N   In the past six months, have you accidently leaked urine? Y   Comment sees an urologist   Do  you have problems with loss of bowel control? Y   Comment had twice   Managing your Medications? N   Managing your Finances? N   Housekeeping or managing your Housekeeping? N     Patient Care Team: [name], MD as PCP - General (Internal Medicine) Marykay Lex, MD as PCP - Cardiology (Cardiology) Eldred Manges, MD as Consulting Physician (Orthopedic Surgery)  Indicate any recent Medical Services you may have received from other than [facility] providers in the past year (date may be approximate).     Assessment:   This is a routine wellness examination for Mettie.  Hearing/Vision screen Hearing Screening - Comments:: Denies hearing issues Vision Screening - Comments:: Regular eye exams, Dr. Elyn Peers   Goals Addressed             This Visit's Progress    Patient Stated       08/28/2023, wants to eat breakfast       Depression Screen    08/28/2023    9:19 AM 01/15/2023   10:47 AM 08/02/2022   12:35 PM 07/19/2021   11:19 AM 07/13/2020   11:48 AM 01/05/2020   11:27 AM 08/12/2019    9:49 AM  PHQ 2/9 Scores  PHQ - 2 Score 0 0 0 0 0 1 0  PHQ- 9 Score 0 0    7     Fall Risk    08/28/2023    9:18 AM 01/15/2023   10:47 AM 08/02/2022   12:35 PM 07/19/2021   11:18 AM 07/13/2020   11:48 AM  Fall Risk   Falls in the past year? 0 0 0 0 0  Number falls in past yr: 0 0 0    Injury with Fall? 0 0 0    Risk for fall due to : Impaired mobility;Impaired balance/gait;Medication side effect No Fall Risks Impaired mobility;Medication side effect Impaired balance/gait;Impaired mobility;Medication side effect Impaired balance/gait;Medication side effect;Orthopedic patient  Follow up Falls prevention discussed;Falls evaluation completed Falls evaluation completed Falls prevention discussed;Education provided;Falls evaluation completed Falls evaluation completed;Education provided;Falls prevention discussed Falls evaluation completed;Education provided;Falls prevention discussed     MEDICARE RISK AT HOME: Medicare Risk at Home Any stairs in or around the home?: Yes (has ramp and chair lift) If so, are there any without handrails?: No Home free of loose throw rugs in walkways, pet beds, electrical cords, etc?: Yes Adequate lighting in your home to reduce risk of falls?: Yes Life alert?: No Use of a cane, walker or w/c?: Yes Grab bars in the bathroom?: Yes Shower chair or bench in shower?: Yes Elevated toilet seat or a handicapped toilet?: Yes  TIMED UP AND GO:  Was the test performed?  Yes  Length of time to ambulate 10 feet: 7 sec Gait slow and steady with assistive device    Cognitive Function:        08/28/2023    9:20 AM 08/02/2022   12:37 PM 07/19/2021   11:21 AM 07/13/2020   11:50 AM 07/08/2019   11:01 AM  6CIT Screen  What Year? 0 points 0 points 0 points 0 points 0 points  What month? 0 points 0 points 0 points 0 points 0 points  What time? 0 points 3 points 0 points 0 points 0 points  Count back from 20 0 points 0 points 0 points 2  points 0 points  Months in reverse 0 points 0 points 2 points 2 points 0 points  Repeat phrase 8 points 8 points 8 points 0 points 0 points  Total Score 8 points 11 points 10 points 4 points 0 points    Immunizations Immunization History  Administered Date(s) Administered   PFIZER(Purple Top)SARS-COV-2 Vaccination 12/05/2019, 12/30/2019, 09/08/2020   Tdap 01/04/2022   Zoster Recombinant(Shingrix) 07/04/2021, 09/02/2021    TDAP status: Up to date  Flu Vaccine status: Declined, Education has been provided regarding the importance of this vaccine but patient still declined. Advised may receive this vaccine at local pharmacy or Health Dept. Aware to provide a copy of the vaccination record if obtained from local pharmacy or Health Dept. Verbalized acceptance and understanding.  Pneumococcal vaccine status: Declined,  Education has been provided regarding the importance of this vaccine but patient still  declined. Advised may receive this vaccine at local pharmacy or Health Dept. Aware to provide a copy of the vaccination record if obtained from local pharmacy or Health Dept. Verbalized acceptance and understanding.   Covid-19 vaccine status: Information provided on how to obtain vaccines.   Qualifies for Shingles Vaccine? Yes   Zostavax completed Yes   Shingrix Completed?: Yes  Screening Tests Health Maintenance  Topic Date Due   MAMMOGRAM  07/20/2022   COVID-19 Vaccine (4 - 2023-24 season) 09/13/2023 (Originally 06/09/2023)   INFLUENZA VACCINE  01/06/2024 (Originally 05/09/2023)   Pneumonia Vaccine 2+ Years old (1 of 1 - PCV) 01/15/2024 (Originally 05/24/2000)   Medicare Annual Wellness (AWV)  08/27/2024   DTaP/Tdap/Td (2 - Td or Tdap) 01/05/2032   DEXA SCAN  Completed   Zoster Vaccines- Shingrix  Completed   HPV VACCINES  Aged Out    Health Maintenance  Health Maintenance Due  Topic Date Due   MAMMOGRAM  07/20/2022    Colorectal cancer screening: No longer required.   Mammogram status: Completed 2024. Repeat every year  Bone Density status: Completed 07/20/2021.   Lung Cancer Screening: (Low Dose CT Chest recommended if Age 28-80 years, 20 pack-year currently smoking OR have quit w/in 15years.) does not qualify.   Lung Cancer Screening Referral: no  Additional Screening:  Hepatitis C Screening: does not qualify;   Vision Screening: Recommended annual ophthalmology exams for early detection of glaucoma and other disorders of the eye. Is the patient up to date with their annual eye exam?  Yes  Who is the provider or what is the name of the office in which the patient attends annual eye exams? Dr. Elyn Peers If pt is not established with a provider, would they like to be referred to a provider to establish care? No .   Dental Screening: Recommended annual dental exams for proper oral hygiene  Diabetic Foot Exam: n/a  Community Resource Referral / Chronic Care  Management: CRR required this visit?  No   CCM required this visit?  No     Plan:     I have personally reviewed and noted the following in the patient's chart:   Medical and social history Use of alcohol, tobacco or illicit drugs  Current medications and supplements including opioid prescriptions. Patient is not currently taking opioid prescriptions. Functional ability and status Nutritional status Physical activity Advanced directives List of other physicians Hospitalizations, surgeries, and ER visits in previous 12 months Vitals Screenings to include cognitive, depression, and falls Referrals and appointments  In addition, I have reviewed and discussed with patient certain preventive protocols, quality metrics, and best practice  recommendations. A written personalized care plan for preventive services as well as general preventive health recommendations were provided to patient.     Barb Merino, LPN   16/07/9603   After Visit Summary: (In Person-Printed) AVS printed and given to the patient  Nurse Notes: none

## 2023-08-28 NOTE — Patient Instructions (Signed)
Hypertension, Adult Hypertension is another name for high blood pressure. High blood pressure forces your heart to work harder to pump blood. This can cause problems over time. There are two numbers in a blood pressure reading. There is a top number (systolic) over a bottom number (diastolic). It is best to have a blood pressure that is below 120/80. What are the causes? The cause of this condition is not known. Some other conditions can lead to high blood pressure. What increases the risk? Some lifestyle factors can make you more likely to develop high blood pressure: Smoking. Not getting enough exercise or physical activity. Being overweight. Having too much fat, sugar, calories, or salt (sodium) in your diet. Drinking too much alcohol. Other risk factors include: Having any of these conditions: Heart disease. Diabetes. High cholesterol. Kidney disease. Obstructive sleep apnea. Having a family history of high blood pressure and high cholesterol. Age. The risk increases with age. Stress. What are the signs or symptoms? High blood pressure may not cause symptoms. Very high blood pressure (hypertensive crisis) may cause: Headache. Fast or uneven heartbeats (palpitations). Shortness of breath. Nosebleed. Vomiting or feeling like you may vomit (nauseous). Changes in how you see. Very bad chest pain. Feeling dizzy. Seizures. How is this treated? This condition is treated by making healthy lifestyle changes, such as: Eating healthy foods. Exercising more. Drinking less alcohol. Your doctor may prescribe medicine if lifestyle changes do not help enough and if: Your top number is above 130. Your bottom number is above 80. Your personal target blood pressure may vary. Follow these instructions at home: Eating and drinking  If told, follow the DASH eating plan. To follow this plan: Fill one half of your plate at each meal with fruits and vegetables. Fill one fourth of your plate  at each meal with whole grains. Whole grains include whole-wheat pasta, brown rice, and whole-grain bread. Eat or drink low-fat dairy products, such as skim milk or low-fat yogurt. Fill one fourth of your plate at each meal with low-fat (lean) proteins. Low-fat proteins include fish, chicken without skin, eggs, beans, and tofu. Avoid fatty meat, cured and processed meat, or chicken with skin. Avoid pre-made or processed food. Limit the amount of salt in your diet to less than 1,500 mg each day. Do not drink alcohol if: Your doctor tells you not to drink. You are pregnant, may be pregnant, or are planning to become pregnant. If you drink alcohol: Limit how much you have to: 0-1 drink a day for women. 0-2 drinks a day for men. Know how much alcohol is in your drink. In the [place] one drink equals one 12 oz bottle of beer (355 mL), one 5 oz glass of wine (148 mL), or one 1 oz glass of hard liquor (44 mL). Lifestyle  Work with your doctor to stay at a healthy weight or to lose weight. Ask your doctor what the best weight is for you. Get at least 30 minutes of exercise that causes your heart to beat faster (aerobic exercise) most days of the week. This may include walking, swimming, or biking. Get at least 30 minutes of exercise that strengthens your muscles (resistance exercise) at least 3 days a week. This may include lifting weights or doing Pilates. Do not smoke or use any products that contain nicotine or tobacco. If you need help quitting, ask your doctor. Check your blood pressure at home as told by your doctor. Keep all follow-up visits. Medicines Take over-the-counter and prescription medicines   only as told by your doctor. Follow directions carefully. Do not skip doses of blood pressure medicine. The medicine does not work as well if you skip doses. Skipping doses also puts you at risk for problems. Ask your doctor about side effects or reactions to medicines that you should watch  for. Contact a doctor if: You think you are having a reaction to the medicine you are taking. You have headaches that keep coming back. You feel dizzy. You have swelling in your ankles. You have trouble with your vision. Get help right away if: You get a very bad headache. You start to feel mixed up (confused). You feel weak or numb. You feel faint. You have very bad pain in your: Chest. Belly (abdomen). You vomit more than once. You have trouble breathing. These symptoms may be an emergency. Get help right away. Call 911. Do not wait to see if the symptoms will go away. Do not drive yourself to the hospital. Summary Hypertension is another name for high blood pressure. High blood pressure forces your heart to work harder to pump blood. For most people, a normal blood pressure is less than 120/80. Making healthy choices can help lower blood pressure. If your blood pressure does not get lower with healthy choices, you may need to take medicine. This information is not intended to replace advice given to you by your health care provider. Make sure you discuss any questions you have with your health care provider. Document Revised: 07/13/2021 Document Reviewed: 07/13/2021 Elsevier Patient Education  2024 Elsevier Inc.  

## 2023-08-28 NOTE — Patient Instructions (Signed)
Ms. Barko , Thank you for taking time to come for your Medicare Wellness Visit. I appreciate your ongoing commitment to your health goals. Please review the following plan we discussed and let me know if I can assist you in the future.   Referrals/Orders/Follow-Ups/Clinician Recommendations: none  This is a list of the screening recommended for you and due dates:  Health Maintenance  Topic Date Due   Mammogram  07/20/2022   COVID-19 Vaccine (4 - 2023-24 season) 09/13/2023*   Flu Shot  01/06/2024*   Pneumonia Vaccine (1 of 1 - PCV) 01/15/2024*   Medicare Annual Wellness Visit  08/27/2024   DTaP/Tdap/Td vaccine (2 - Td or Tdap) 01/05/2032   DEXA scan (bone density measurement)  Completed   Zoster (Shingles) Vaccine  Completed   HPV Vaccine  Aged Out  *Topic was postponed. The date shown is not the original due date.    Advanced directives: (Copy Requested) Please bring a copy of your health care power of attorney and living will to the office to be added to your chart at your convenience.  Next Medicare Annual Wellness Visit scheduled for next year: Yes  Insert Preventive Care attachment Insert FALL PREVENTION attachment if needed

## 2023-08-28 NOTE — Progress Notes (Signed)
I,[name], CMA,acting as a Neurosurgeon for Savannah Aliment, MD.,have documented all relevant documentation on the behalf of Savannah Aliment, MD,as directed by  Savannah Aliment, MD while in the presence of Savannah Aliment, MD.  Subjective:  Patient ID: [name] , female    DOB: 1933/02/13 , 89 y.o.   MRN: [id number]  Chief Complaint  Patient presents with   Hypertension   Hyperlipidemia    HPI  The patient is here today for HTN & cholesterol f/u. She reports compliance with meds. She denies headaches, chest pain and shortness of breath.  She denies having any specific questions or concerns.  AWV completed with [facility] Advisor: Nickeah.     Hypertension This is a chronic problem. The current episode started more than 1 year ago. The problem has been gradually improving since onset. The problem is controlled. Pertinent negatives include no blurred vision. Risk factors for coronary artery disease include dyslipidemia, obesity and post-menopausal state. Past treatments include beta blockers. The current treatment provides moderate improvement. Compliance problems include exercise.      Past Medical History:  Diagnosis Date   Anxiety    Aortic stenosis    Echo 07/05/16: Very mild AS, Mean gradient 10 mm Hg, Peak gradient (S) 19 mmHg    Arthritis    left hip and back   Bronchiectasis    isolated to RML; status post right middle lobe partial resection.   CHF (congestive heart failure) (HCC)    Chronic kidney disease    Complication of anesthesia    hard to wake up   Dyspnea    Gall stones    GERD (gastroesophageal reflux disease)    history    H/O osteoporosis    Heart murmur    hasn't been heard lately   Hypertension    Hyperthyroidism    endocrinologist - Dr. Talmage Nap   Pneumonia    Yeast infection      Family History  Problem Relation Age of Onset   Hypertension Mother    CVA Mother 71   Heart attack Sister    Diabetes Sister    Healthy Father    Heart attack  Brother    Heart attack Brother      Current Outpatient Medications:    acetaminophen (TYLENOL) 650 MG CR tablet, Take 1,300 mg by mouth every 8 (eight) hours as needed for pain., Disp: , Rfl:    Ascorbic Acid (VITAMIN C) 500 MG CAPS, Take 500 mg by mouth daily., Disp: , Rfl:    aspirin EC 81 MG tablet, Take 1 tablet (81 mg total) by mouth 2 (two) times daily., Disp: 60 tablet, Rfl: 0   busPIRone (BUSPAR) 5 MG tablet, TAKE 1 TABLET(5 MG) BY MOUTH DAILY AS NEEDED FOR ANXIETY, Disp: 90 tablet, Rfl: 1   calcium carbonate (OS-CAL) 600 MG TABS tablet, Take 600 mg by mouth daily., Disp: , Rfl:    cholecalciferol (VITAMIN D3) 25 MCG (1000 UT) tablet, Take 1,000 Units by mouth daily., Disp: , Rfl:    diclofenac Sodium (VOLTAREN) 1 % GEL, APPLY 2 GRAMS 4 TIMES DAILY AS DIRECTED, Disp: 500 g, Rfl: 1   fenofibrate (TRICOR) 145 MG tablet, TAKE 1 TABLET BY MOUTH EVERY DAY, Disp: 90 tablet, Rfl: 3   furosemide (LASIX) 20 MG tablet, TAKE 1 TABLET BY MOUTH ONCE DAILY, Disp: 90 tablet, Rfl: 3   lidocaine (LIDODERM) 5 %, Place 1 patch onto the skin daily as needed (hip pain)., Disp: 90  patch, Rfl: 1   methimazole (TAPAZOLE) 5 MG tablet, Take 2.5 mg by mouth daily. , Disp: , Rfl:    metoprolol succinate (TOPROL-XL) 25 MG 24 hr tablet, Take 1 tablet (25 mg total) by mouth daily., Disp: 90 tablet, Rfl: 3   nitrofurantoin (MACRODANTIN) 100 MG capsule, Take 100 mg by mouth 2 (two) times daily., Disp: , Rfl:    nystatin cream (MYCOSTATIN), APPLY TOPCIALLY TO AFFECTED AREA TWICE DAILY AS NEEDED, Disp: 30 g, Rfl: 1   oxybutynin (DITROPAN-XL) 10 MG 24 hr tablet, Take 10 mg by mouth daily., Disp: , Rfl:    oxyCODONE-acetaminophen (PERCOCET/ROXICET) 5-325 MG tablet, Take 1 tablet by mouth every 4 (four) hours as needed for severe pain. (Patient not taking: Reported on 08/28/2023), Disp: 30 tablet, Rfl: 0   potassium chloride SA (KLOR-CON M) 20 MEQ tablet, TAKE 1 TABLET BY MOUTH EVERY DAY, Disp: 90 tablet, Rfl: 3    tiZANidine (ZANAFLEX) 2 MG tablet, Take 1 tablet (2 mg total) by mouth every 6 (six) hours as needed for muscle spasms. (Patient not taking: Reported on 08/28/2023), Disp: 60 tablet, Rfl: 0   Allergies  Allergen Reactions   Ibuprofen Other (See Comments)    hallucinations Other reaction(s): hallucinations   Naproxen Sodium Other (See Comments)    hallucinations   Nitrofurantoin Rash   Oxybutynin Rash     Review of Systems  Constitutional: Negative.   Eyes:  Negative for blurred vision.  Respiratory: Negative.    Cardiovascular: Negative.   Gastrointestinal: Negative.   Neurological: Negative.   Psychiatric/Behavioral: Negative.       Today's Vitals   08/28/23 0911  BP: 112/60  Pulse: 79  Temp: 97.6 F (36.4 C)  SpO2: 98%  Weight: 192 lb (87.1 kg)  Height: 5\' 1"  (1.549 m)   Body mass index is 36.28 kg/m.  Wt Readings from Last 3 Encounters:  09/02/23 194 lb 12.8 oz (88.4 kg)  08/28/23 192 lb (87.1 kg)  08/28/23 192 lb 12.8 oz (87.5 kg)     Objective:  Physical Exam Vitals and nursing note reviewed.  Constitutional:      Appearance: Normal appearance.  HENT:     Head: Normocephalic and atraumatic.  Eyes:     Extraocular Movements: Extraocular movements intact.  Cardiovascular:     Rate and Rhythm: Normal rate and regular rhythm.     Heart sounds: Normal heart sounds.  Pulmonary:     Effort: Pulmonary effort is normal.     Breath sounds: Normal breath sounds.  Musculoskeletal:     Cervical back: Normal range of motion.  Skin:    General: Skin is warm.  Neurological:     General: No focal deficit present.     Mental Status: She is alert.  Psychiatric:        Mood and Affect: Mood normal.        Behavior: Behavior normal.         Assessment And Plan:  Hypertensive heart and renal disease with renal failure, stage 1 through stage 4 or unspecified chronic kidney disease, with heart failure (HCC) Assessment & Plan: Chronic, well controlled. She is  encouraged to follow a low sodium diet.  She will continue with metoprolol XL 25mg  daily. She will f/u in four to six months for re-evaluation.   Orders: -     CMP14+EGFR -     Amb Referral To Provider Referral Exercise Program (P.R.E.P) -     TSH  Diastolic CHF, chronic (HCC) Assessment &  Plan: Chronic, last echo was in 2017.  She had nl EF and grade 2 diastolic dysfunction. Encouraged to follow low sodium diet. Cardiology input appreciated, no need for med changes.   Orders: -     Amb Referral To Provider Referral Exercise Program (P.R.E.P)  Chronic renal disease, stage II Assessment & Plan: Chronic, she is encouraged to keep BP well controlled and to stay well hydrated to decrease risk of CKD progression.     Atherosclerosis of aorta (HCC) Assessment & Plan: Chronic, LDL goal is less than 70. She is statin intolerant. She will continue with fenofibrate 145mg  daily. Encouraged to follow a heart healthy diet.   Orders: -     Amb Referral To Provider Referral Exercise Program (P.R.E.P)  Localized osteoporosis without current pathological fracture Assessment & Plan: She agrees to repeat bone density. She declines medication for treatment. Risks associated with untreated osteoporosis d/w patient, she still declines. She does agree to [employer] referral. Encouraged to take MVI and vitamin D 2000 unit capsules daily.   Orders: -     Amb Referral To Provider Referral Exercise Program (P.R.E.P) -     DG Bone Density; Future  Anemia, unspecified type -     CBC -     Iron, TIBC and Ferritin Panel  Class 2 severe obesity due to excess calories with serious comorbidity and body mass index (BMI) of 36.0 to 36.9 in adult [facility]) Assessment & Plan: She is encouraged to strive for BMI less than 30 to decrease cardiac risk. Advised to aim for at least 150 minutes of exercise per week.   Orders: -     Amb Referral To Provider Referral Exercise Program (P.R.E.P)  Statin myopathy  She is  encouraged to strive for BMI less than 30 to decrease cardiac risk. Advised to aim for at least 150 minutes of exercise per week.    Return for 1 year awv & ov w rs. 6 months bpc & chol f/u. Marland Kitchen  Patient was given opportunity to ask questions. Patient verbalized understanding of the plan and was able to repeat key elements of the plan. All questions were answered to their satisfaction.    I, Savannah Aliment, MD, have reviewed all documentation for this visit. The documentation on 08/28/23 for the exam, diagnosis, procedures, and orders are all accurate and complete.   IF YOU HAVE BEEN REFERRED TO A SPECIALIST, IT MAY TAKE 1-2 WEEKS TO SCHEDULE/PROCESS THE REFERRAL. IF YOU HAVE NOT HEARD FROM US/SPECIALIST IN TWO WEEKS, PLEASE GIVE [place] A CALL AT [phone].   THE PATIENT IS ENCOURAGED TO PRACTICE SOCIAL DISTANCING DUE TO THE COVID-19 PANDEMIC.

## 2023-08-29 LAB — IRON,TIBC AND FERRITIN PANEL
Ferritin: 64 ng/mL (ref 15–150)
Iron Saturation: 12 % — ABNORMAL LOW (ref 15–55)
Iron: 50 ug/dL (ref 27–139)
Total Iron Binding Capacity: 418 ug/dL (ref 250–450)
UIBC: 368 ug/dL (ref 118–369)

## 2023-08-29 LAB — CMP14+EGFR
ALT: 7 [IU]/L (ref 0–32)
AST: 18 [IU]/L (ref 0–40)
Albumin: 3.8 g/dL (ref 3.7–4.7)
Alkaline Phosphatase: 67 [IU]/L (ref 44–121)
BUN/Creatinine Ratio: 20 (ref 12–28)
BUN: 14 mg/dL (ref 8–27)
Bilirubin Total: 0.4 mg/dL (ref 0.0–1.2)
CO2: 21 mmol/L (ref 20–29)
Calcium: 9.2 mg/dL (ref 8.7–10.3)
Chloride: 103 mmol/L (ref 96–106)
Creatinine, Ser: 0.69 mg/dL (ref 0.57–1.00)
Globulin, Total: 3.5 g/dL (ref 1.5–4.5)
Glucose: 85 mg/dL (ref 70–99)
Potassium: 4.4 mmol/L (ref 3.5–5.2)
Sodium: 137 mmol/L (ref 134–144)
Total Protein: 7.3 g/dL (ref 6.0–8.5)
eGFR: 83 mL/min/{1.73_m2} (ref >59)

## 2023-08-29 LAB — CBC
Hematocrit: 37.7 % (ref 34.0–46.6)
Hemoglobin: 12.1 g/dL (ref 11.1–15.9)
MCH: 25.7 pg — ABNORMAL LOW (ref 26.6–33.0)
MCHC: 32.1 g/dL (ref 31.5–35.7)
MCV: 80 fL (ref 79–97)
Platelets: 429 10*3/uL (ref 150–450)
RBC: 4.71 x10E6/uL (ref 3.77–5.28)
RDW: 16.8 % — ABNORMAL HIGH (ref 11.7–15.4)
WBC: 4.9 10*3/uL (ref 3.4–10.8)

## 2023-08-29 LAB — TSH: TSH: 0.834 u[IU]/mL (ref 0.450–4.500)

## 2023-09-02 ENCOUNTER — Ambulatory Visit (INDEPENDENT_AMBULATORY_CARE_PROVIDER_SITE_OTHER): Payer: Medicare Other | Admitting: Nurse Practitioner

## 2023-09-02 ENCOUNTER — Encounter: Payer: Self-pay | Admitting: Nurse Practitioner

## 2023-09-02 VITALS — BP 110/80 | HR 91 | Temp 97.7°F | Ht 61.0 in | Wt 194.8 lb

## 2023-09-02 DIAGNOSIS — T7840XA Allergy, unspecified, initial encounter: Secondary | ICD-10-CM | POA: Diagnosis not present

## 2023-09-02 DIAGNOSIS — E66812 Obesity, class 2: Secondary | ICD-10-CM

## 2023-09-02 DIAGNOSIS — R21 Rash and other nonspecific skin eruption: Secondary | ICD-10-CM | POA: Diagnosis not present

## 2023-09-02 DIAGNOSIS — R32 Unspecified urinary incontinence: Secondary | ICD-10-CM

## 2023-09-02 DIAGNOSIS — Z6836 Body mass index (BMI) 36.0-36.9, adult: Secondary | ICD-10-CM

## 2023-09-02 MED ORDER — TRIAMCINOLONE ACETONIDE 40 MG/ML IJ SUSP
40.0000 mg | Freq: Once | INTRAMUSCULAR | Status: AC
Start: 2023-09-02 — End: 2023-09-02
  Administered 2023-09-02: 40 mg via INTRAMUSCULAR

## 2023-09-02 NOTE — Assessment & Plan Note (Addendum)
Has erythematous, macular, flat, splotchy rash to bilateral arms and legs this may be related to an allergic reaction to one of her medications nitrofuratoin or oxybutynin. She will contact urology to see next steps at this time advised to stop the oxybutynin.  Kenalog injection given. Will list medications as an allergy with rash as the response

## 2023-09-02 NOTE — Progress Notes (Signed)
Madelaine Bhat, CMA,acting as a Neurosurgeon for Arnette Felts, FNP.,have documented all relevant documentation on the behalf of Arnette Felts, FNP,as directed by  Arnette Felts, FNP while in the presence of Arnette Felts, FNP.  Subjective:  Patient ID: [name] , female    DOB: May 17, 1933 , 89 y.o.   MRN: [id number]  Chief Complaint  Patient presents with   Rash    HPI  Patient presents today for a rash, patient reports she first noticed the rash Saturday morning. Patient reports she was put on a new pill for a UTI, she reports she thinks that's what caused the rash. Patient reports she first started the pills on 08/21/2023. Patient reports the medication was give by Dr.Maceianmid, he gave her oxybutynin (she stopped taking on Saturday) and nitrofurantoin (she has taken all of these). She is unsure if she had taken the nitrofuratoin in the past.   Patients rash is on her legs and arms. Patient was seen here by PCP on 08/28/2023 she reports she didn't have any symptoms then. Patient reports the rash is not itchy, just present. Feels like it has worsened  Rash The affected locations include the chest (upper and lower extremities). Pertinent negatives include no cough, fatigue or shortness of breath. Treatments tried: lotion only, has stopped taking all her medications.     Past Medical History:  Diagnosis Date   Anxiety    Aortic stenosis    Echo 07/05/16: Very mild AS, Mean gradient 10 mm Hg, Peak gradient (S) 19 mmHg    Arthritis    left hip and back   Bronchiectasis    isolated to RML; status post right middle lobe partial resection.   CHF (congestive heart failure) (HCC)    Chronic kidney disease    Complication of anesthesia    hard to wake up   Dyspnea    Gall stones    GERD (gastroesophageal reflux disease)    history    H/O osteoporosis    Heart murmur    hasn't been heard lately   Hypertension    Hyperthyroidism    endocrinologist - Dr. Talmage Nap   Pneumonia    Yeast  infection      Family History  Problem Relation Age of Onset   Hypertension Mother    CVA Mother 75   Heart attack Sister    Diabetes Sister    Healthy Father    Heart attack Brother    Heart attack Brother      Current Outpatient Medications:    acetaminophen (TYLENOL) 650 MG CR tablet, Take 1,300 mg by mouth every 8 (eight) hours as needed for pain., Disp: , Rfl:    Ascorbic Acid (VITAMIN C) 500 MG CAPS, Take 500 mg by mouth daily., Disp: , Rfl:    aspirin EC 81 MG tablet, Take 1 tablet (81 mg total) by mouth 2 (two) times daily., Disp: 60 tablet, Rfl: 0   busPIRone (BUSPAR) 5 MG tablet, TAKE 1 TABLET(5 MG) BY MOUTH DAILY AS NEEDED FOR ANXIETY, Disp: 90 tablet, Rfl: 1   calcium carbonate (OS-CAL) 600 MG TABS tablet, Take 600 mg by mouth daily., Disp: , Rfl:    cholecalciferol (VITAMIN D3) 25 MCG (1000 UT) tablet, Take 1,000 Units by mouth daily., Disp: , Rfl:    diclofenac Sodium (VOLTAREN) 1 % GEL, APPLY 2 GRAMS 4 TIMES DAILY AS DIRECTED, Disp: 500 g, Rfl: 1   fenofibrate (TRICOR) 145 MG tablet, TAKE 1 TABLET BY MOUTH EVERY DAY, Disp:  90 tablet, Rfl: 3   furosemide (LASIX) 20 MG tablet, TAKE 1 TABLET BY MOUTH ONCE DAILY, Disp: 90 tablet, Rfl: 3   lidocaine (LIDODERM) 5 %, Place 1 patch onto the skin daily as needed (hip pain)., Disp: 90 patch, Rfl: 1   methimazole (TAPAZOLE) 5 MG tablet, Take 2.5 mg by mouth daily. , Disp: , Rfl:    metoprolol succinate (TOPROL-XL) 25 MG 24 hr tablet, Take 1 tablet (25 mg total) by mouth daily., Disp: 90 tablet, Rfl: 3   nitrofurantoin (MACRODANTIN) 100 MG capsule, Take 100 mg by mouth 2 (two) times daily., Disp: , Rfl:    nystatin cream (MYCOSTATIN), APPLY TOPCIALLY TO AFFECTED AREA TWICE DAILY AS NEEDED, Disp: 30 g, Rfl: 1   oxybutynin (DITROPAN-XL) 10 MG 24 hr tablet, Take 10 mg by mouth daily., Disp: , Rfl:    potassium chloride SA (KLOR-CON M) 20 MEQ tablet, TAKE 1 TABLET BY MOUTH EVERY DAY, Disp: 90 tablet, Rfl: 3   oxyCODONE-acetaminophen  (PERCOCET/ROXICET) 5-325 MG tablet, Take 1 tablet by mouth every 4 (four) hours as needed for severe pain. (Patient not taking: Reported on 08/28/2023), Disp: 30 tablet, Rfl: 0   tiZANidine (ZANAFLEX) 2 MG tablet, Take 1 tablet (2 mg total) by mouth every 6 (six) hours as needed for muscle spasms. (Patient not taking: Reported on 08/28/2023), Disp: 60 tablet, Rfl: 0   Allergies  Allergen Reactions   Ibuprofen Other (See Comments)    hallucinations Other reaction(s): hallucinations   Naproxen Sodium Other (See Comments)    hallucinations   Nitrofurantoin Rash   Oxybutynin Rash     Review of Systems  Constitutional:  Negative for fatigue.  Respiratory: Negative.  Negative for cough and shortness of breath.   Cardiovascular: Negative.   Musculoskeletal: Negative.   Skin:  Positive for rash (upper and lower extremities).  Neurological: Negative.   Psychiatric/Behavioral: Negative.       Today's Vitals   09/02/23 1414  BP: 110/80  Pulse: 91  Temp: 97.7 F (36.5 C)  TempSrc: Oral  Weight: 194 lb 12.8 oz (88.4 kg)  Height: 5\' 1"  (1.549 m)  PainSc: 0-No pain   Body mass index is 36.81 kg/m.  Wt Readings from Last 3 Encounters:  09/02/23 194 lb 12.8 oz (88.4 kg)  08/28/23 192 lb (87.1 kg)  08/28/23 192 lb 12.8 oz (87.5 kg)     Objective:  Physical Exam Vitals reviewed.  Constitutional:      Appearance: She is well-developed.  HENT:     Head: Normocephalic and atraumatic.  Eyes:     Pupils: Pupils are equal, round, and reactive to light.  Cardiovascular:     Rate and Rhythm: Normal rate and regular rhythm.     Pulses: Normal pulses.     Heart sounds: Normal heart sounds. No murmur heard. Pulmonary:     Effort: Pulmonary effort is normal. No respiratory distress.     Breath sounds: Normal breath sounds. No wheezing.  Musculoskeletal:        General: Normal range of motion.  Skin:    General: Skin is warm and dry.     Capillary Refill: Capillary refill takes less  than 2 seconds.     Findings: Rash (flat, macular, splotchy rash bilateral arms and legs) present.  Neurological:     General: No focal deficit present.     Mental Status: She is alert and oriented to person, place, and time.     Cranial Nerves: No cranial nerve deficit.  Motor: No weakness.  Psychiatric:        Mood and Affect: Mood normal.        Behavior: Behavior normal.        Thought Content: Thought content normal.        Judgment: Judgment normal.         Assessment And Plan:  Rash Assessment & Plan: Has erythematous, macular, flat, splotchy rash to bilateral arms and legs this may be related to an allergic reaction to one of her medications nitrofuratoin or oxybutynin. She will contact urology to see next steps at this time advised to stop the oxybutynin.  Kenalog injection given. Will list medications as an allergy with rash as the response  Orders: -     Triamcinolone Acetonide  Allergic reaction, initial encounter  Urinary incontinence, unspecified type Assessment & Plan: She is to hold her oxybutynin until speaking with Urology   Class 2 severe obesity due to excess calories with serious comorbidity and body mass index (BMI) of 36.0 to 36.9 in adult [facility])    No follow-ups on file.  Patient was given opportunity to ask questions. Patient verbalized understanding of the plan and was able to repeat key elements of the plan. All questions were answered to their satisfaction.    Jeanell Sparrow, FNP, have reviewed all documentation for this visit. The documentation on 09/02/23 for the exam, diagnosis, procedures, and orders are all accurate and complete.   IF YOU HAVE BEEN REFERRED TO A SPECIALIST, IT MAY TAKE 1-2 WEEKS TO SCHEDULE/PROCESS THE REFERRAL. IF YOU HAVE NOT HEARD FROM US/SPECIALIST IN TWO WEEKS, PLEASE GIVE [place] A CALL AT [phone].

## 2023-09-02 NOTE — Assessment & Plan Note (Signed)
She is to hold her oxybutynin until speaking with Urology

## 2023-09-02 NOTE — Patient Instructions (Addendum)
You were given a Kenalog (steroid) injection today I would hold the oxybutynin until speaking with Urology If you have shortness of breath go to the ER.  You can take a claritin at night daily to see if this also helps

## 2023-09-04 DIAGNOSIS — E049 Nontoxic goiter, unspecified: Secondary | ICD-10-CM | POA: Diagnosis not present

## 2023-09-04 DIAGNOSIS — E0590 Thyrotoxicosis, unspecified without thyrotoxic crisis or storm: Secondary | ICD-10-CM | POA: Diagnosis not present

## 2023-09-04 DIAGNOSIS — R748 Abnormal levels of other serum enzymes: Secondary | ICD-10-CM | POA: Diagnosis not present

## 2023-09-04 DIAGNOSIS — I10 Essential (primary) hypertension: Secondary | ICD-10-CM | POA: Diagnosis not present

## 2023-09-05 DIAGNOSIS — D649 Anemia, unspecified: Secondary | ICD-10-CM | POA: Insufficient documentation

## 2023-09-05 DIAGNOSIS — G720 Drug-induced myopathy: Secondary | ICD-10-CM | POA: Insufficient documentation

## 2023-09-05 NOTE — Assessment & Plan Note (Signed)
She is encouraged to strive for BMI less than 30 to decrease cardiac risk. Advised to aim for at least 150 minutes of exercise per week.

## 2023-09-05 NOTE — Assessment & Plan Note (Signed)
Chronic, well controlled. She is encouraged to follow a low sodium diet.  She will continue with metoprolol XL 25mg  daily. She will f/u in four to six months for re-evaluation.

## 2023-09-05 NOTE — Assessment & Plan Note (Signed)
Chronic, LDL goal is less than 70. She is statin intolerant. She will continue with fenofibrate 145mg  daily. Encouraged to follow a heart healthy diet.

## 2023-09-05 NOTE — Assessment & Plan Note (Signed)
Chronic, last echo was in 2017.  She had nl EF and grade 2 diastolic dysfunction. Encouraged to follow low sodium diet. Cardiology input appreciated, no need for med changes.

## 2023-09-05 NOTE — Assessment & Plan Note (Signed)
Chronic, she is encouraged to keep BP well controlled and to stay well hydrated to decrease risk of CKD progression.

## 2023-09-05 NOTE — Assessment & Plan Note (Signed)
She agrees to repeat bone density. She declines medication for treatment. Risks associated with untreated osteoporosis d/w patient, she still declines. She does agree to [employer] referral. Encouraged to take MVI and vitamin D 2000 unit capsules daily.

## 2023-09-16 ENCOUNTER — Telehealth: Payer: Self-pay | Admitting: *Deleted

## 2023-09-16 NOTE — Telephone Encounter (Signed)
Contacted regarding PREP Class referral. States she would prefer to wait for a Spring class due to potential cold weather issues and parking at the [facility]. We will contact back once Spring classes scheduled.

## 2023-09-19 ENCOUNTER — Other Ambulatory Visit: Payer: Self-pay | Admitting: Internal Medicine

## 2023-09-25 DIAGNOSIS — N390 Urinary tract infection, site not specified: Secondary | ICD-10-CM | POA: Diagnosis not present

## 2023-09-25 DIAGNOSIS — N3946 Mixed incontinence: Secondary | ICD-10-CM | POA: Diagnosis not present

## 2023-09-25 DIAGNOSIS — R350 Frequency of micturition: Secondary | ICD-10-CM | POA: Diagnosis not present

## 2023-09-25 DIAGNOSIS — R8271 Bacteriuria: Secondary | ICD-10-CM | POA: Diagnosis not present

## 2023-10-04 NOTE — Progress Notes (Deleted)
 Office Visit Note  Patient: [name]             Date of Birth: Jul 26, 1933           MRN: [id number]             PCP: [name], MD Referring: [name], MD Visit Date: 10/17/2023 Occupation: @GUAROCC @  Subjective:  No chief complaint on file.   History of Present Illness: [name] is a 89 y.o. female ***with osteoarthritis.  She returns today after her last visit in January 2023.    Activities of Daily Living:  Patient reports morning stiffness for *** {minute/hour:19697}.   Patient {ACTIONS;DENIES/REPORTS:21021675::"Denies"} nocturnal pain.  Difficulty dressing/grooming: {ACTIONS;DENIES/REPORTS:21021675::"Denies"} Difficulty climbing stairs: {ACTIONS;DENIES/REPORTS:21021675::"Denies"} Difficulty getting out of chair: {ACTIONS;DENIES/REPORTS:21021675::"Denies"} Difficulty using hands for taps, buttons, cutlery, and/or writing: {ACTIONS;DENIES/REPORTS:21021675::"Denies"}  No Rheumatology ROS completed.   PMFS History:  Patient Active Problem List   Diagnosis Date Noted   Anemia 09/05/2023   Statin myopathy 09/05/2023   Rash 09/02/2023   Allergic reaction 09/02/2023   Urinary incontinence 09/02/2023   Class 2 severe obesity due to excess calories with serious comorbidity and body mass index (BMI) of 36.0 to 36.9 in adult [facility]) 09/02/2023   Chills 05/02/2023   Iron deficiency 05/02/2023   S/P revision of total knee, left 03/18/2023   Loose left total knee arthroplasty (HCC) 03/07/2023   Trochanteric bursitis, left hip 07/03/2022   Dyspepsia 01/01/2021   Atherosclerosis of aorta (HCC) 01/01/2021   Diastolic CHF, chronic (HCC) 12/18/2020   Quadriceps weakness 12/06/2020   Status post total left knee replacement 03/09/2020   History of total hip arthroplasty, right 08/21/2019   Intermittent claudication (HCC) 07/08/2019   Hypertensive heart and renal disease 12/15/2018   Chronic renal disease, stage II 12/15/2018   H/O total hip arthroplasty, left  11/07/2017   Presence of left artificial hip joint 05/14/2017   Preop cardiovascular exam 03/12/2017   Cough    Abdominal pain    Symptomatic cholelithiasis 07/01/2016   Nonspecific ST-T wave electrocardiographic changes    Lower leg edema 02/16/2015   Obesity (BMI 30.0-34.9) 02/16/2015   Localized osteoporosis without current pathological fracture 01/11/2012   Essential hypertension 01/11/2012   Hyperthyroidism 01/11/2012   Hypercholesteremia 01/11/2012   H/O vaginal hysterectomy 1974 01/11/2012    Class: History of   S/P removal of lung  partial R lobe  2010 01/11/2012   History of hernia repair  R ing. 1975 01/11/2012   BACK PAIN 11/12/2007   DYSPNEA 11/11/2007    Past Medical History:  Diagnosis Date   Anxiety    Aortic stenosis    Echo 07/05/16: Very mild AS, Mean gradient 10 mm Hg, Peak gradient (S) 19 mmHg    Arthritis    left hip and back   Bronchiectasis    isolated to RML; status post right middle lobe partial resection.   CHF (congestive heart failure) (HCC)    Chronic kidney disease    Complication of anesthesia    hard to wake up   Dyspnea    Gall stones    GERD (gastroesophageal reflux disease)    history    H/O osteoporosis    Heart murmur    hasn't been heard lately   Hypertension    Hyperthyroidism    endocrinologist - Dr. Talmage Nap   Pneumonia    Yeast infection     Family History  Problem Relation Age of Onset   Hypertension Mother    CVA Mother  56   Heart attack Sister    Diabetes Sister    Healthy Father    Heart attack Brother    Heart attack Brother    Past Surgical History:  Procedure Laterality Date   ABDOMINAL HYSTERECTOMY  1974   CHOLECYSTECTOMY N/A 07/02/2016   Procedure: LAPAROSCOPIC CHOLECYSTECTOMY;  Surgeon: Almond Lint, MD;  Location: [facility] OR;  Service: General;  Laterality: N/A;   COLONOSCOPY     ENDOSCOPIC RETROGRADE CHOLANGIOPANCREATOGRAPHY (ERCP) WITH PROPOFOL N/A 08/07/2018   Procedure: ENDOSCOPIC RETROGRADE  CHOLANGIOPANCREATOGRAPHY (ERCP) WITH PROPOFOL;  Surgeon: Jeani Hawking, MD;  Location: [facility] ENDOSCOPY;  Service: Endoscopy;  Laterality: N/A;   EYE SURGERY     stye lanced   HARDWARE REMOVAL Left 02/24/2020   Procedure: Hardware Removal, left knee;  Surgeon: Eldred Manges, MD;  Location: [facility] OR;  Service: Orthopedics;  Laterality: Left;   HERNIA REPAIR  1975   KNEE SURGERY Left    LUNG REMOVAL, PARTIAL  11/2006   RML   NM MYOVIEW LTD  06/2011   Normal EF. No ischemia or infarction.   SPHINCTEROTOMY  08/07/2018   Procedure: SPHINCTEROTOMY;  Surgeon: Jeani Hawking, MD;  Location: [facility];  Service: Endoscopy;;  balloon sweep   STERIOD INJECTION Right 02/24/2020   Procedure: RIGHT KNEE STEROID INTRA-ARTICULAR MARCAINE/DEPO-MEDROL INJECTION UNDER ANESTHESIA;  Surgeon: Eldred Manges, MD;  Location: [facility] OR;  Service: Orthopedics;  Laterality: Right;   TOTAL HIP ARTHROPLASTY Left 04/05/2017   Procedure: LEFT TOTAL HIP ARTHROPLASTY ANTERIOR APPROACH;  Surgeon: Eldred Manges, MD;  Location: [facility] OR;  Service: Orthopedics;  Laterality: Left;   TOTAL HIP ARTHROPLASTY Right 12/19/2018   Procedure: RIGHT TOTAL HIP ARTHROPLASTY-DIRECT ANTERIOR APPROACH;  Surgeon: Eldred Manges, MD;  Location: [facility] OR;  Service: Orthopedics;  Laterality: Right;   TOTAL KNEE ARTHROPLASTY Left 02/24/2020   Procedure: LEFT TOTAL KNEE ARTHROPLASTY;  Surgeon: Eldred Manges, MD;  Location: [facility] OR;  Service: Orthopedics;  Laterality: Left;   TOTAL KNEE REVISION Left 03/18/2023   Procedure: LEFT TOTAL KNEE REVISION;  Surgeon: Gean Birchwood, MD;  Location: [facility] ORS;  Service: Orthopedics;  Laterality: Left;   TRANSTHORACIC ECHOCARDIOGRAM  06/2011   Mild concentric LVH. Normal EF with impaired relaxation. Mildly elevated RV pressures of 30 and 40 mmHg.   TRANSTHORACIC ECHOCARDIOGRAM  06/2016   normal LV size and function. EF 60-65% with GRD 2 DD. Mild aortic stenosis. Mild LA dilation. Mild to moderately increased PA pressures (46 mmHg).    TUBAL LIGATION     Social History   Social History Narrative   She is a widowed mother of 4, grandmother for, a great-grandmother 4.   She has been trying to do more exercise than before, but has been injured recently by her back pain.   She never smoked, doesn't drink alcohol.   Immunization History  Administered Date(s) Administered   PFIZER(Purple Top)SARS-COV-2 Vaccination 12/05/2019, 12/30/2019, 09/08/2020   Tdap 01/04/2022   Zoster Recombinant(Shingrix) 07/04/2021, 09/02/2021     Objective: Vital Signs: There were no vitals taken for this visit.   Physical Exam   Musculoskeletal Exam: ***  CDAI Exam: CDAI Score: -- Patient Global: --; Provider Global: -- Swollen: --; Tender: -- Joint Exam 10/17/2023   No joint exam has been documented for this visit   There is currently no information documented on the homunculus. Go to the Rheumatology activity and complete the homunculus joint exam.  Investigation: No additional findings.  Imaging: No results found.  Recent Labs: Lab Results  Component Value Date   WBC 4.9 08/28/2023   HGB 12.1 08/28/2023   PLT 429 08/28/2023   NA 137 08/28/2023   K 4.4 08/28/2023   CL 103 08/28/2023   CO2 21 08/28/2023   GLUCOSE 85 08/28/2023   BUN 14 08/28/2023   CREATININE 0.69 08/28/2023   BILITOT 0.4 08/28/2023   ALKPHOS 67 08/28/2023   AST 18 08/28/2023   ALT 7 08/28/2023   PROT 7.3 08/28/2023   ALBUMIN 3.8 08/28/2023   CALCIUM 9.2 08/28/2023   GFRAA 80 06/28/2020    Speciality Comments: No specialty comments available.  Procedures:  No procedures performed Allergies: Ibuprofen, Naproxen sodium, Nitrofurantoin, and Oxybutynin   Assessment / Plan:     Visit Diagnoses: No diagnosis found.  Orders: No orders of the defined types were placed in this encounter.  No orders of the defined types were placed in this encounter.   Face-to-face time spent with patient was *** minutes. Greater than 50% of time was spent  in counseling and coordination of care.  Follow-Up Instructions: No follow-ups on file.   Pollyann Savoy, MD  Note - This record has been created using Animal nutritionist.  Chart creation errors have been sought, but may not always  have been located. Such creation errors do not reflect on  the standard of medical care.

## 2023-10-17 ENCOUNTER — Ambulatory Visit: Payer: Medicare Other | Admitting: Rheumatology

## 2023-10-17 DIAGNOSIS — Z96643 Presence of artificial hip joint, bilateral: Secondary | ICD-10-CM

## 2023-10-17 DIAGNOSIS — Z902 Acquired absence of lung [part of]: Secondary | ICD-10-CM

## 2023-10-17 DIAGNOSIS — E782 Mixed hyperlipidemia: Secondary | ICD-10-CM

## 2023-10-17 DIAGNOSIS — I10 Essential (primary) hypertension: Secondary | ICD-10-CM

## 2023-10-17 DIAGNOSIS — Z96641 Presence of right artificial hip joint: Secondary | ICD-10-CM

## 2023-10-17 DIAGNOSIS — I129 Hypertensive chronic kidney disease with stage 1 through stage 4 chronic kidney disease, or unspecified chronic kidney disease: Secondary | ICD-10-CM

## 2023-10-17 DIAGNOSIS — I700 Atherosclerosis of aorta: Secondary | ICD-10-CM

## 2023-10-17 DIAGNOSIS — I5032 Chronic diastolic (congestive) heart failure: Secondary | ICD-10-CM

## 2023-10-17 DIAGNOSIS — M19041 Primary osteoarthritis, right hand: Secondary | ICD-10-CM

## 2023-10-17 DIAGNOSIS — E0590 Thyrotoxicosis, unspecified without thyrotoxic crisis or storm: Secondary | ICD-10-CM

## 2023-10-17 DIAGNOSIS — Z96652 Presence of left artificial knee joint: Secondary | ICD-10-CM

## 2023-10-17 DIAGNOSIS — M810 Age-related osteoporosis without current pathological fracture: Secondary | ICD-10-CM

## 2023-11-14 DIAGNOSIS — M25562 Pain in left knee: Secondary | ICD-10-CM | POA: Diagnosis not present

## 2023-11-26 ENCOUNTER — Emergency Department (HOSPITAL_BASED_OUTPATIENT_CLINIC_OR_DEPARTMENT_OTHER): Payer: Medicare Other

## 2023-11-26 ENCOUNTER — Encounter (HOSPITAL_BASED_OUTPATIENT_CLINIC_OR_DEPARTMENT_OTHER): Payer: Self-pay

## 2023-11-26 ENCOUNTER — Emergency Department (HOSPITAL_BASED_OUTPATIENT_CLINIC_OR_DEPARTMENT_OTHER)
Admission: EM | Admit: 2023-11-26 | Discharge: 2023-11-26 | Disposition: A | Discharge status: Home / Self Care | Payer: Medicare Other | Attending: Emergency Medicine | Admitting: Emergency Medicine

## 2023-11-26 ENCOUNTER — Other Ambulatory Visit: Payer: Self-pay

## 2023-11-26 DIAGNOSIS — Z79899 Other long term (current) drug therapy: Secondary | ICD-10-CM | POA: Diagnosis not present

## 2023-11-26 DIAGNOSIS — K5732 Diverticulitis of large intestine without perforation or abscess without bleeding: Secondary | ICD-10-CM | POA: Insufficient documentation

## 2023-11-26 DIAGNOSIS — R509 Fever, unspecified: Secondary | ICD-10-CM | POA: Diagnosis not present

## 2023-11-26 DIAGNOSIS — R110 Nausea: Secondary | ICD-10-CM | POA: Diagnosis present

## 2023-11-26 DIAGNOSIS — N189 Chronic kidney disease, unspecified: Secondary | ICD-10-CM | POA: Diagnosis not present

## 2023-11-26 DIAGNOSIS — K5792 Diverticulitis of intestine, part unspecified, without perforation or abscess without bleeding: Secondary | ICD-10-CM | POA: Diagnosis not present

## 2023-11-26 DIAGNOSIS — I509 Heart failure, unspecified: Secondary | ICD-10-CM | POA: Diagnosis not present

## 2023-11-26 DIAGNOSIS — Z7982 Long term (current) use of aspirin: Secondary | ICD-10-CM | POA: Diagnosis not present

## 2023-11-26 DIAGNOSIS — I10 Essential (primary) hypertension: Secondary | ICD-10-CM | POA: Diagnosis not present

## 2023-11-26 DIAGNOSIS — I130 Hypertensive heart and chronic kidney disease with heart failure and stage 1 through stage 4 chronic kidney disease, or unspecified chronic kidney disease: Secondary | ICD-10-CM | POA: Insufficient documentation

## 2023-11-26 DIAGNOSIS — R1013 Epigastric pain: Secondary | ICD-10-CM | POA: Diagnosis not present

## 2023-11-26 LAB — CBC
HCT: 34.8 % — ABNORMAL LOW (ref 36.0–46.0)
Hemoglobin: 12.4 g/dL (ref 12.0–15.0)
MCH: 27.1 pg (ref 26.0–34.0)
MCHC: 35.6 g/dL (ref 30.0–36.0)
MCV: 76.1 fL — ABNORMAL LOW (ref 80.0–100.0)
Platelets: 329 10*3/uL (ref 150–400)
RBC: 4.57 MIL/uL (ref 3.87–5.11)
RDW: 15.4 % (ref 11.5–15.5)
WBC: 7.1 10*3/uL (ref 4.0–10.5)
nRBC: 0 % (ref 0.0–0.2)

## 2023-11-26 LAB — URINALYSIS, ROUTINE W REFLEX MICROSCOPIC
Bilirubin Urine: NEGATIVE
Glucose, UA: NEGATIVE mg/dL
Hgb urine dipstick: NEGATIVE
Ketones, ur: NEGATIVE mg/dL
Leukocytes,Ua: NEGATIVE
Nitrite: POSITIVE — AB
Protein, ur: 30 mg/dL — AB
Specific Gravity, Urine: 1.01 (ref 1.005–1.030)
pH: 5.5 (ref 5.0–8.0)

## 2023-11-26 LAB — COMPREHENSIVE METABOLIC PANEL
ALT: 18 U/L (ref 0–44)
AST: 27 U/L (ref 15–41)
Albumin: 3 g/dL — ABNORMAL LOW (ref 3.5–5.0)
Alkaline Phosphatase: 49 U/L (ref 38–126)
Anion gap: 11 (ref 5–15)
BUN: 12 mg/dL (ref 8–23)
CO2: 24 mmol/L (ref 22–32)
Calcium: 8.7 mg/dL — ABNORMAL LOW (ref 8.9–10.3)
Chloride: 99 mmol/L (ref 98–111)
Creatinine, Ser: 0.66 mg/dL (ref 0.44–1.00)
GFR, Estimated: >60 mL/min (ref >60)
Glucose, Bld: 99 mg/dL (ref 70–99)
Potassium: 3.5 mmol/L (ref 3.5–5.1)
Sodium: 134 mmol/L — ABNORMAL LOW (ref 135–145)
Total Bilirubin: 1.1 mg/dL (ref 0.0–1.2)
Total Protein: 7.9 g/dL (ref 6.5–8.1)

## 2023-11-26 LAB — URINALYSIS, MICROSCOPIC (REFLEX)

## 2023-11-26 LAB — TROPONIN I (HIGH SENSITIVITY): Troponin I (High Sensitivity): 7 ng/L (ref <18)

## 2023-11-26 LAB — RESP PANEL BY RT-PCR (RSV, FLU A&B, COVID)  RVPGX2
Influenza A by PCR: NEGATIVE
Influenza B by PCR: NEGATIVE
Resp Syncytial Virus by PCR: NEGATIVE
SARS Coronavirus 2 by RT PCR: NEGATIVE

## 2023-11-26 LAB — LIPASE, BLOOD: Lipase: 28 U/L (ref 11–51)

## 2023-11-26 MED ORDER — IOHEXOL 300 MG/ML  SOLN
100.0000 mL | Freq: Once | INTRAMUSCULAR | Status: AC | PRN
  Administered 2023-11-26: 100 mL via INTRAVENOUS

## 2023-11-26 MED ORDER — OXYCODONE-ACETAMINOPHEN 5-325 MG PO TABS: 1.0000 | ORAL_TABLET | Freq: Four times a day (QID) | ORAL | 0 refills | Status: AC | PRN

## 2023-11-26 MED ORDER — FOSFOMYCIN TROMETHAMINE 3 G PO PACK
3.0000 g | PACK | Freq: Once | ORAL | Status: AC
  Administered 2023-11-26: 3 g via ORAL
  Filled 2023-11-26: qty 3

## 2023-11-26 MED ORDER — ONDANSETRON HCL 4 MG/2ML IJ SOLN: 4.0000 mg | Freq: Three times a day (TID) | INTRAMUSCULAR | Status: DC | PRN

## 2023-11-26 MED ORDER — ONDANSETRON 4 MG PO TBDP: 4.0000 mg | ORAL_TABLET | Freq: Three times a day (TID) | ORAL | 0 refills | Status: AC | PRN

## 2023-11-26 MED ORDER — AMOXICILLIN-POT CLAVULANATE 875-125 MG PO TABS: 1.0000 | ORAL_TABLET | Freq: Two times a day (BID) | ORAL | 0 refills | Status: DC

## 2023-11-26 MED ORDER — SODIUM CHLORIDE 0.9 % IV BOLUS
500.0000 mL | Freq: Once | INTRAVENOUS | Status: AC
  Administered 2023-11-26: 500 mL via INTRAVENOUS

## 2023-11-26 NOTE — ED Notes (Signed)
 Pt provided chicken noodle soup, saltine crackers for PO challenge.

## 2023-11-26 NOTE — Discharge Instructions (Addendum)
 Your CT scan showed evidence of acute diverticulitis.  We will discharge you on a course of antibiotics, pain medicine and nausea medicine.  Return for any severe worsening symptoms otherwise follow-up with your primary care provider to ensure resolution. Take Tylenol for pain control, and opiates only as needed for breakthrough pain.

## 2023-11-26 NOTE — ED Triage Notes (Signed)
 C/o fatigue, nausea, decreased appetite, epigastric pain x 1 week. Has had issues intermittently since she had her gallbladder removed years ago.

## 2023-11-26 NOTE — ED Notes (Signed)
 Pt transported to imaging.

## 2023-11-26 NOTE — ED Notes (Signed)
 D/c paperwork reviewed with pt, including prescriptions and follow up care.  All questions and/or concerns addressed at time of d/c.  No further needs expressed. . Pt verbalized understanding, Wheeled by ED staff to ED exit, NAD.

## 2023-11-26 NOTE — ED Notes (Addendum)
 Pt denies nausea and pain at this time. Asking if she can eat, pt informed that we must wait until imaging results before considering PO intake. Pt verbalized understanding. No further needs expressed, call bell within reach.

## 2023-11-26 NOTE — ED Provider Notes (Signed)
 [place] [facility] Provider Note   CSN: [id number] Arrival date & time: 11/26/23  9518     History  Chief Complaint  Patient presents with   Nausea    [name] is a 89 y.o. female.  HPI   89 year old female with medical history significant for bronchiectasis status post right middle lobe partial resection, HTN, gallstones s/p cholecystectomy, GERD, aortic stenosis, CKD, CHF, anxiety presenting to the emergency department with fatigue, nausea, decreased appetite, multiple episodes of NBNB emesis with epigastric pain.  The nausea and epigastric discomfort has been present for the past week.  She has been vomiting a few times over the past few days.  She is tolerating oral fluid intake, has had no oral solid intake.  She has had intermittent GI issues since her gallbladder was removed years ago and has had difficulty tolerating meals ever since.  She states that her last bowel movement was yesterday and was normal however she is currently not passing gas.  She denies any burning while urinating or frequency, states her urine has been more dark. She endorses subjective fevers and chills, denies any cough, upper respiratory congestion or sore throat.  Home Medications Prior to Admission medications   Medication Sig Start Date End Date Taking? Authorizing Provider  amoxicillin-clavulanate (AUGMENTIN) 875-125 MG tablet Take 1 tablet by mouth every 12 (twelve) hours. 11/26/23  Yes Ernie Avena, MD  ondansetron (ZOFRAN-ODT) 4 MG disintegrating tablet Take 1 tablet (4 mg total) by mouth every 8 (eight) hours as needed. 11/26/23  Yes Ernie Avena, MD  oxyCODONE-acetaminophen (PERCOCET/ROXICET) 5-325 MG tablet Take 1 tablet by mouth every 6 (six) hours as needed for severe pain (pain score 7-10). 11/26/23  Yes Ernie Avena, MD  acetaminophen (TYLENOL) 650 MG CR tablet Take 1,300 mg by mouth every 8 (eight) hours as needed for pain.    [provider]  Ascorbic Acid (VITAMIN C) 500 MG CAPS Take 500 mg by mouth daily.    [provider]  aspirin EC 81 MG tablet Take 1 tablet (81 mg total) by mouth 2 (two) times daily. 03/18/23   Allena Katz, PA-C  busPIRone (BUSPAR) 5 MG tablet TAKE 1 TABLET(5 MG) BY MOUTH DAILY AS NEEDED FOR ANXIETY 03/25/23   [name], MD  calcium carbonate (OS-CAL) 600 MG TABS tablet Take 600 mg by mouth daily.    [provider]  cholecalciferol (VITAMIN D3) 25 MCG (1000 UT) tablet Take 1,000 Units by mouth daily.    [provider]  diclofenac Sodium (VOLTAREN) 1 % GEL APPLY 2 GRAMS 4 TIMES DAILY AS DIRECTED 10/19/21   [name], MD  fenofibrate (TRICOR) 145 MG tablet TAKE 1 TABLET BY MOUTH EVERY DAY 12/13/22   [name], MD  furosemide (LASIX) 20 MG tablet TAKE 1 TABLET BY MOUTH ONCE DAILY 09/24/22   [name], MD  lidocaine (LIDODERM) 5 % Place 1 patch onto the skin daily as needed (hip pain). 01/04/22   [name], MD  methimazole (TAPAZOLE) 5 MG tablet Take 2.5 mg by mouth daily.     [provider]  metoprolol succinate (TOPROL-XL) 25 MG 24 hr tablet Take 1 tablet (25 mg total) by mouth daily. 01/04/22   [name], MD  nitrofurantoin (MACRODANTIN) 100 MG capsule Take 100 mg by mouth 2 (two) times daily. 08/21/23   [provider]  nystatin cream (MYCOSTATIN) APPLY TOPCIALLY TO AFFECTED AREA TWICE DAILY AS NEEDED 03/08/23   [name],  MD  oxybutynin (DITROPAN-XL) 10 MG 24 hr tablet Take 10 mg by mouth daily. 08/21/23   [provider]  potassium chloride SA (KLOR-CON M) 20 MEQ tablet TAKE 1 TABLET BY MOUTH EVERY DAY 09/24/22   [name], MD  tiZANidine (ZANAFLEX) 2 MG tablet Take 1 tablet (2 mg total) by mouth every 6 (six) hours as needed for muscle spasms. Patient not taking: Reported on 08/28/2023 03/18/23   Allena Katz, PA-C      Allergies    Ibuprofen, Naproxen sodium, Nitrofurantoin, and Oxybutynin     Review of Systems   Review of Systems  All other systems reviewed and are negative.   Physical Exam Updated Vital Signs BP 134/77   Pulse 79   Temp 98.2 F (36.8 C) (Oral)   Resp 15   Ht 5\' 1"  (1.549 m)   Wt 89.4 kg   SpO2 97%   BMI 37.22 kg/m  Physical Exam Vitals and nursing note reviewed.  Constitutional:      General: She is not in acute distress.    Appearance: She is well-developed.  HENT:     Head: Normocephalic and atraumatic.  Eyes:     Conjunctiva/sclera: Conjunctivae normal.  Cardiovascular:     Rate and Rhythm: Normal rate and regular rhythm.  Pulmonary:     Effort: Pulmonary effort is normal. No respiratory distress.     Breath sounds: Normal breath sounds.  Abdominal:     Palpations: Abdomen is soft.     Tenderness: There is abdominal tenderness in the epigastric area.  Musculoskeletal:        General: No swelling.     Cervical back: Neck supple.  Skin:    General: Skin is warm and dry.     Capillary Refill: Capillary refill takes less than 2 seconds.  Neurological:     Mental Status: She is alert.  Psychiatric:        Mood and Affect: Mood normal.     ED Results / Procedures / Treatments   Labs (all labs ordered are listed, but only abnormal results are displayed) Labs Reviewed  COMPREHENSIVE METABOLIC PANEL - Abnormal; Notable for the following components:      Result Value   Sodium 134 (*)    Calcium 8.7 (*)    Albumin 3.0 (*)    All other components within normal limits  CBC - Abnormal; Notable for the following components:   HCT 34.8 (*)    MCV 76.1 (*)    All other components within normal limits  URINALYSIS, ROUTINE W REFLEX MICROSCOPIC - Abnormal; Notable for the following components:   Protein, ur 30 (*)    Nitrite POSITIVE (*)    All other components within normal limits  URINALYSIS, MICROSCOPIC (REFLEX) - Abnormal; Notable for the following components:   Bacteria, UA RARE (*)    All other components within normal limits   RESP PANEL BY RT-PCR (RSV, FLU A&B, COVID)  RVPGX2  LIPASE, BLOOD  TROPONIN I (HIGH SENSITIVITY)    EKG EKG Interpretation Date/Time:  Tuesday November 26 2023 10:28:05 EST Ventricular Rate:  77 PR Interval:  179 QRS Duration:  88 QT Interval:  375 QTC Calculation: 425 R Axis:   60  Text Interpretation: Sinus rhythm Minimal ST elevation, inferior leads similar to previous EKGs Confirmed by Ernie Avena ([id number]) on 11/26/2023 10:31:04 AM  Radiology CT ABDOMEN PELVIS W CONTRAST Result Date: 11/26/2023 CLINICAL DATA:  Bowel obstruction suspected. Fatigue, nausea, decreased appetite. Epigastric pain for  1 week. Gallbladder removed years ago. EXAM: CT ABDOMEN AND PELVIS WITH CONTRAST TECHNIQUE: Multidetector CT imaging of the abdomen and pelvis was performed using the standard protocol following bolus administration of intravenous contrast. RADIATION DOSE REDUCTION: This exam was performed according to the departmental dose-optimization program which includes automated exposure control, adjustment of the mA and/or kV according to patient size and/or use of iterative reconstruction technique. CONTRAST:  OMNIPAQUE IOHEXOL 300 MG/ML  SOLN COMPARISON:  MRI abdomen 07/31/2021, CT abdomen 01/23/2018, CT angio abdomen and pelvis 07/01/2016, CT chest 08/19/2006 FINDINGS: Lower chest: There is again surgical suture within the right lower lung with surrounding curvilinear postsurgical change and scarring. Surgical clips within the right infrahilar region are again seen. Mild curvilinear scarring within the lingula is unchanged. Calcifications within the inferior left hilar region likely reflect calcified lymph nodes from prior remote granulomatous infection. The right main pulmonary artery measures up to 3.5 cm in caliber and the partially visualized left main pulmonary artery also measures greater than 3 cm in caliber, SMA unchanged from prior. This again can be seen with chronic pulmonary arterial  hypertension. Dense aortic root and coronary artery calcifications are again seen. Hepatobiliary: Smooth liver contours. Status post cholecystectomy. No focal liver lesion is seen. Pancreas: Unremarkable. No pancreatic ductal dilatation or surrounding inflammatory changes. Spleen: Normal in size without focal abnormality. Adrenals/Urinary Tract: Normal adrenals. The kidneys enhance uniformly and are symmetric in size without hydronephrosis. Unchanged partially exophytic 7 mm low-attenuation lesion at the posterior aspect of the left renal midpole. No follow-up imaging is recommended. No suspicious renal lesion is seen. The urinary bladder is decompressed, limiting evaluation. Streak artifact from bilateral total hip arthroplasty hardware limits evaluation of the lower pelvis. No gross urinary bladder abnormality is seen. Stomach/Bowel: Moderate to high-grade sigmoid and mild distal descending colon diverticulosis is similar to prior. No inflammatory change to indicate acute diverticulitis. A few diverticula are again seen within the transverse colon. Mild air-fluid levels within the ascending colon. Mild fluid within the terminal ileum without wall thickening. Normal appendix (axial images 51 through 58). Tiny sliding hiatal hernia similar to prior. No dilated loops of bowel are seen to indicate bowel obstruction. There is minimal inflammatory stranding within the fat posterior to the proximal ascending colon in the region of a few small diverticula, mild diverticulitis (axial series 301, images 41 through 44 and coronal images 71 through 73). Vascular/Lymphatic: No abdominal aortic aneurysm. Dense atherosclerotic calcifications. The major intra-abdominal aortic branch vessels are patent. No pathologically enlarged mesenteric, retroperitoneal, or pelvic lymph nodes. Reproductive: The uterus again appears to be surgically absent no adnexal mass is seen. Other: No ventral abdominal hernia. No free air or free fluid is  seen within the abdomen or pelvis. Musculoskeletal: Mild dextrocurvature of the mid thoracic spine. Moderate to severe multilevel disc space narrowing, endplate sclerosis, peripheral osteophytosis, and vacuum phenomenon throughout the visualized lower thoracic and lumbar spine. Status post bilateral total hip arthroplasty. A sclerotic focus within the right ilium adjacent to the sacroiliac joints is unchanged from 07/01/2016 and benign, likely a bone island. IMPRESSION: 1. Mild acute uncomplicated diverticulitis of the proximal ascending colon. Only mild ascending and transverse colon diverticulosis. 2. Moderate to high-grade sigmoid and mild distal descending colon diverticulosis without evidence of acute diverticulitis. 3. No evidence of bowel obstruction. 4. Stable dilatation of the main pulmonary arteries, which can be seen with chronic pulmonary arterial hypertension. Electronically Signed   By: Neita Garnet M.D.   On: 11/26/2023 14:39  Procedures Procedures    Medications Ordered in ED Medications  ondansetron (ZOFRAN) injection 4 mg (has no administration in time range)  fosfomycin (MONUROL) packet 3 g (has no administration in time range)  iohexol (OMNIPAQUE) 300 MG/ML solution 100 mL (100 mLs Intravenous Contrast Given 11/26/23 1140)  sodium chloride 0.9 % bolus 500 mL (500 mLs Intravenous New Bag/Given 11/26/23 1450)    ED Course/ Medical Decision Making/ A&P                                 Medical Decision Making Amount and/or Complexity of Data Reviewed Labs: ordered. Radiology: ordered.  Risk Prescription drug management.       89 year old female with medical history significant for bronchiectasis status post right middle lobe partial resection, HTN, gallstones s/p cholecystectomy, GERD, aortic stenosis, CKD, CHF, anxiety presenting to the emergency department with fatigue, nausea, decreased appetite, multiple episodes of NBNB emesis with epigastric pain.  The nausea and  epigastric discomfort has been present for the past week.  She has been vomiting a few times over the past few days.  She is tolerating oral fluid intake, has had no oral solid intake.  She has had intermittent GI issues since her gallbladder was removed years ago and has had difficulty tolerating meals ever since.  She states that her last bowel movement was yesterday and was normal however she is currently not passing gas.  She denies any burning while urinating or frequency, states her urine has been more dark. She endorses subjective fevers and chills, denies any cough, upper respiratory congestion or sore throat.  Medical Decision Making:   JOZELYNN DANIELSON is a 89 y.o. female who presented to the ED today with abdominal pain, detailed above.     Complete initial physical exam performed, notably the patient  was TTP in the epigastrium.     Reviewed and confirmed nursing documentation for past medical history, family history, social history.    Initial Assessment:   With the patient's presentation of abdominal pain, most likely diagnosis is SBO vs viral illness. Other diagnoses were considered including (but not limited to) gastroenteritis, colitis, appendicitis, pancreatitis, nephrolithiasis, UTI, pyelonephritis, diverticulitis.    Initial Plan:  CBC/CMP to evaluate for underlying infectious/metabolic etiology for patient's abdominal pain  Lipase to evaluate for pancreatitis  EKG to evaluate for cardiac source of pain in addition to cardiac troponin CTAB/Pelvis with contrast to evaluate for structural/surgical etiology of patients' severe abdominal pain.  Urinalysis and repeat physical assessment to evaluate for UTI/Pyelonpehritis  Empiric management of symptoms with escalating pain control and antiemetics as needed.   Initial Study Results:   Laboratory  All laboratory results reviewed without evidence of clinically relevant pathology.   Exceptions include: Urinalysis with positive  nitrites, negative leukocytes, rare bacteria, 6-10 WBCs.  Patient with minimal urinary symptoms however does have left lower quadrant tenderness in the setting of diverticulitis.  Will treat with a one-time dose of fosfomycin.  CMP generally unremarkable, COVID, flu, RSV testing negative, troponin normal, lipase normal, CBC without a leukocytosis or anemia.   EKG EKG was reviewed independently. Rate, rhythm, axis, intervals all examined and without medically relevant abnormality. ST segments with nonspecific changes, similar to prior. No STEMI    Radiology All images reviewed independently. Agree with radiology report at this time.   CT ABDOMEN PELVIS W CONTRAST Result Date: 11/26/2023 CLINICAL DATA:  Bowel obstruction suspected. Fatigue, nausea, decreased appetite.  Epigastric pain for 1 week. Gallbladder removed years ago. EXAM: CT ABDOMEN AND PELVIS WITH CONTRAST TECHNIQUE: Multidetector CT imaging of the abdomen and pelvis was performed using the standard protocol following bolus administration of intravenous contrast. RADIATION DOSE REDUCTION: This exam was performed according to the departmental dose-optimization program which includes automated exposure control, adjustment of the mA and/or kV according to patient size and/or use of iterative reconstruction technique. CONTRAST:  OMNIPAQUE IOHEXOL 300 MG/ML  SOLN COMPARISON:  MRI abdomen 07/31/2021, CT abdomen 01/23/2018, CT angio abdomen and pelvis 07/01/2016, CT chest 08/19/2006 FINDINGS: Lower chest: There is again surgical suture within the right lower lung with surrounding curvilinear postsurgical change and scarring. Surgical clips within the right infrahilar region are again seen. Mild curvilinear scarring within the lingula is unchanged. Calcifications within the inferior left hilar region likely reflect calcified lymph nodes from prior remote granulomatous infection. The right main pulmonary artery measures up to 3.5 cm in caliber and the  partially visualized left main pulmonary artery also measures greater than 3 cm in caliber, SMA unchanged from prior. This again can be seen with chronic pulmonary arterial hypertension. Dense aortic root and coronary artery calcifications are again seen. Hepatobiliary: Smooth liver contours. Status post cholecystectomy. No focal liver lesion is seen. Pancreas: Unremarkable. No pancreatic ductal dilatation or surrounding inflammatory changes. Spleen: Normal in size without focal abnormality. Adrenals/Urinary Tract: Normal adrenals. The kidneys enhance uniformly and are symmetric in size without hydronephrosis. Unchanged partially exophytic 7 mm low-attenuation lesion at the posterior aspect of the left renal midpole. No follow-up imaging is recommended. No suspicious renal lesion is seen. The urinary bladder is decompressed, limiting evaluation. Streak artifact from bilateral total hip arthroplasty hardware limits evaluation of the lower pelvis. No gross urinary bladder abnormality is seen. Stomach/Bowel: Moderate to high-grade sigmoid and mild distal descending colon diverticulosis is similar to prior. No inflammatory change to indicate acute diverticulitis. A few diverticula are again seen within the transverse colon. Mild air-fluid levels within the ascending colon. Mild fluid within the terminal ileum without wall thickening. Normal appendix (axial images 51 through 58). Tiny sliding hiatal hernia similar to prior. No dilated loops of bowel are seen to indicate bowel obstruction. There is minimal inflammatory stranding within the fat posterior to the proximal ascending colon in the region of a few small diverticula, mild diverticulitis (axial series 301, images 41 through 44 and coronal images 71 through 73). Vascular/Lymphatic: No abdominal aortic aneurysm. Dense atherosclerotic calcifications. The major intra-abdominal aortic branch vessels are patent. No pathologically enlarged mesenteric, retroperitoneal, or  pelvic lymph nodes. Reproductive: The uterus again appears to be surgically absent no adnexal mass is seen. Other: No ventral abdominal hernia. No free air or free fluid is seen within the abdomen or pelvis. Musculoskeletal: Mild dextrocurvature of the mid thoracic spine. Moderate to severe multilevel disc space narrowing, endplate sclerosis, peripheral osteophytosis, and vacuum phenomenon throughout the visualized lower thoracic and lumbar spine. Status post bilateral total hip arthroplasty. A sclerotic focus within the right ilium adjacent to the sacroiliac joints is unchanged from 07/01/2016 and benign, likely a bone island. IMPRESSION: 1. Mild acute uncomplicated diverticulitis of the proximal ascending colon. Only mild ascending and transverse colon diverticulosis. 2. Moderate to high-grade sigmoid and mild distal descending colon diverticulosis without evidence of acute diverticulitis. 3. No evidence of bowel obstruction. 4. Stable dilatation of the main pulmonary arteries, which can be seen with chronic pulmonary arterial hypertension. Electronically Signed   By: Neita Garnet M.D.   On:  11/26/2023 14:39       Final Reassessment and Plan:   Patient presents with acute uncomplicated diverticulitis.  Fluid resuscitated and administered fosfomycin due to minimal findings of UTI on UA.  Will treat with Augmentin outpatient. Patient tolerating oral intake and well-appearing at time of discharge, return precautions provided.  Final Clinical Impression(s) / ED Diagnoses Final diagnoses:  Diverticulitis    Rx / DC Orders ED Discharge Orders          Ordered    amoxicillin-clavulanate (AUGMENTIN) 875-125 MG tablet  Every 12 hours        11/26/23 1449    ondansetron (ZOFRAN-ODT) 4 MG disintegrating tablet  Every 8 hours PRN        11/26/23 1449    oxyCODONE-acetaminophen (PERCOCET/ROXICET) 5-325 MG tablet  Every 6 hours PRN        11/26/23 1450              Ernie Avena, MD 11/26/23  1453

## 2023-11-29 ENCOUNTER — Telehealth: Payer: Self-pay

## 2023-11-29 NOTE — Transitions of Care (Post Inpatient/ED Visit) (Signed)
   11/29/2023  Name: [name] MRN: [id number] DOB: 06/17/1933  Today's TOC FU Call Status: Today's TOC FU Call Status:: Unsuccessful Call (2nd Attempt) Unsuccessful Call (2nd Attempt) Date: 11/29/23  Attempted to reach the patient regarding the most recent Inpatient/ED visit.  Follow Up Plan: Additional outreach attempts will be made to reach the patient to complete the Transitions of Care (Post Inpatient/ED visit) call.   Signature Karena Addison, LPN [facility] Nurse Health Advisor Direct Dial [phone]

## 2023-12-03 NOTE — Transitions of Care (Post Inpatient/ED Visit) (Signed)
   12/03/2023  Name: [name] MRN: [id number] DOB: 09/06/1933  Today's TOC FU Call Status: Today's TOC FU Call Status:: Unsuccessful Call (2nd Attempt) Unsuccessful Call (2nd Attempt) Date: 11/29/23  Attempted to reach the patient regarding the most recent Inpatient/ED visit.  Follow Up Plan: No further outreach attempts will be made at this time. We have been unable to contact the patient. Patient already seen in office Signature Karena Addison, LPN [facility] Nurse Health Advisor Direct Dial [phone]

## 2023-12-05 DIAGNOSIS — E0590 Thyrotoxicosis, unspecified without thyrotoxic crisis or storm: Secondary | ICD-10-CM | POA: Diagnosis not present

## 2023-12-05 DIAGNOSIS — I10 Essential (primary) hypertension: Secondary | ICD-10-CM | POA: Diagnosis not present

## 2023-12-05 DIAGNOSIS — R748 Abnormal levels of other serum enzymes: Secondary | ICD-10-CM | POA: Diagnosis not present

## 2023-12-05 DIAGNOSIS — E049 Nontoxic goiter, unspecified: Secondary | ICD-10-CM | POA: Diagnosis not present

## 2023-12-09 ENCOUNTER — Ambulatory Visit: Payer: Self-pay | Admitting: Internal Medicine

## 2023-12-09 NOTE — Telephone Encounter (Signed)
  Chief Complaint: bruising under right eye unknown how bruising occurred Symptoms: noted bruise under right eye / cheek area. Pt reports around "eye socket" . Not sure if she feel asleep with glasses on . Bruise approx size of quarter but not round  Frequency: Saturday night  Pertinent Negatives: Patient denies other bruising no pain no swelling  Disposition: [] ED /[] Urgent Care (no appt availability in office) / [] Appointment(In office/virtual)/ []  [place]/ [x] Home Care/ [] Refused Recommended Disposition /[] [place] [facility]/ []  Follow-up with PCP Additional Notes:   Appt already scheduled for Wednesday . Recommended can apply cool compress. Recommended since has or noted greater than 48 hours can apply heat alternate if needed. Recommended to call back if other bruises found.       Reason for Disposition  [1] Not caused by an injury AND [2] < 5 unexplained bruises    Only bruising under right eye / cheek area  Answer Assessment - Initial Assessment Questions 1. APPEARANCE of BRUISE: "Describe the bruise."      Bruise on right cheek under eye 2. SIZE: "How large is the bruise?"      Quarter size  3. NUMBER: "How many bruises are there?"      Only around/under eye 4. LOCATION: "Where is the bruise located?"      Around eye bone  5. ONSET: "How long ago did the bruise occur?"      Saturday night  6. CAUSE: "Tell me how it happened."     May fell asleep with glasses on  7. MEDICAL HISTORY: "Do you have any medical problems that can cause easy bruising or bleeding?" (e.g., leukemia, liver disease, recent chemotherapy)     na 8. MEDICINES: "Do you take any medications which thin the blood such as: aspirin, heparin, ibuprofen (NSAIDS), Plavix, or Coumadin?"     Aspirin 81 mg  9. OTHER SYMPTOMS: "Do you have any other symptoms?"  (e.g., weakness, dizziness, pain, fever, nosebleed, blood in urine/stool)     Right eye bruising at cheek area. No swelling  10.  PREGNANCY: "Is there any chance you are pregnant?" "When was your last menstrual period?"       na  Protocols used: [employer]

## 2023-12-11 ENCOUNTER — Encounter: Payer: Self-pay | Admitting: Internal Medicine

## 2023-12-11 ENCOUNTER — Other Ambulatory Visit: Payer: Self-pay | Admitting: Internal Medicine

## 2023-12-11 ENCOUNTER — Ambulatory Visit (INDEPENDENT_AMBULATORY_CARE_PROVIDER_SITE_OTHER): Admitting: Internal Medicine

## 2023-12-11 VITALS — BP 90/60 | HR 67 | Temp 98.2°F | Ht 61.0 in | Wt 195.0 lb

## 2023-12-11 DIAGNOSIS — Z6836 Body mass index (BMI) 36.0-36.9, adult: Secondary | ICD-10-CM | POA: Diagnosis not present

## 2023-12-11 DIAGNOSIS — E66812 Obesity, class 2: Secondary | ICD-10-CM | POA: Diagnosis not present

## 2023-12-11 DIAGNOSIS — H05231 Hemorrhage of right orbit: Secondary | ICD-10-CM

## 2023-12-11 DIAGNOSIS — K5792 Diverticulitis of intestine, part unspecified, without perforation or abscess without bleeding: Secondary | ICD-10-CM | POA: Diagnosis not present

## 2023-12-11 DIAGNOSIS — S0011XA Contusion of right eyelid and periocular area, initial encounter: Secondary | ICD-10-CM

## 2023-12-11 MED ORDER — NYSTATIN 100000 UNIT/GM EX CREA: TOPICAL_CREAM | Freq: Two times a day (BID) | CUTANEOUS | 1 refills | Status: AC | PRN

## 2023-12-11 MED ORDER — FAMOTIDINE 20 MG PO TABS: 20.0000 mg | ORAL_TABLET | Freq: Every day | ORAL | 1 refills | Status: DC

## 2023-12-11 NOTE — Patient Instructions (Signed)
Diverticulosis  Diverticulosis is when small pouches called diverticula form in the wall of the colon. The colon is where water is absorbed. It is also where poop (stool) is formed. The pouches form when the inside layer of the colon pushes through weak spots in the outer layers of the colon. You may have a few pouches or many of them. In most cases, the pouches do not cause problems. If they become inflamed or infected, you may have a condition called diverticulitis. What are the causes? The cause of this condition is not known. What increases the risk? You are more likely to get this condition if: You are older than 88 years of age. You do not eat enough fiber or you get constipated a lot. You are overweight. You do not get enough exercise. You smoke. You take over-the-counter pain medicines. You have a family history of the condition. What are the signs or symptoms? In most people, there are no symptoms. If you do have symptoms, they may include: Bloating. Stomach cramps. Constipation or diarrhea. Pain in the lower left side of your abdomen. How is this diagnosed? This condition is often diagnosed during an exam for other colon problems. It may be diagnosed when you have: A colonoscopy. This is when a tube with a camera on the end is used to look at your colon. A barium enema. This is an X-ray exam that uses dye to look at your colon. A CT scan. How is this treated? You may not need treatment. Your health care provider will tell you what you can do at home to help prevent problems. You may need treatment if you have symptoms or if you have had diverticulitis before. You may be told to: Eat a high-fiber diet. Take medicine to relax your colon. Lose weight. Follow these instructions at home: Medicines Take over-the-counter and prescription medicines only as told by your provider. If told, take a fiber supplement or probiotic. Managing constipation Your condition may cause  constipation. To prevent or treat constipation, you may need to: Drink enough fluid to keep your pee (urine) pale yellow. Take over-the-counter or prescription medicines. Eat foods that are high in fiber, such as beans, whole grains, and fresh fruits and vegetables. Limit foods that are high in fat and processed sugars, such as fried or sweet foods. Try not to strain when you poop. Contact a health care provider if: Your symptoms get worse all of a sudden. You have pain in your abdomen that gets worse. You have bloating or stomach cramps. You continue to have frequent constipation. You have a fever or chills. You vomit. Your poop is bloody, black, or tarry. This information is not intended to replace advice given to you by your health care provider. Make sure you discuss any questions you have with your health care provider. Document Revised: 06/21/2022 Document Reviewed: 06/21/2022 Elsevier Patient Education  2024 [employer] Inc.  

## 2023-12-11 NOTE — Progress Notes (Signed)
 I,[name], CMA,acting as a Neurosurgeon for Savannah Aliment, MD.,have documented all relevant documentation on the behalf of Savannah Aliment, MD,as directed by  Savannah Aliment, MD while in the presence of Savannah Aliment, MD.  Subjective:  Patient ID: [name] , female    DOB: Nov 27, 1932 , 89 y.o.   MRN: [id number]  Chief Complaint  Patient presents with   Follow-up    HPI  Patient presents today for ED follow up, accompanied by her daughter in law, [name]. She presented to [facility] ED on 11/26/23 for further evaluation of nausea.  She went to ER c/o fatigue, nausea, decreased appetite, multiple episodes of  emesis with epigastric pain.  The nausea and epigastric discomfort had been present a week prior to presentation.  She had been vomiting several times for several days prior to ED presentation. She has had intermittent GI issues since her gallbladder was removed years ago and has had difficulty tolerating meals ever since.  Associated sx includes fever/chills.   ED workup included CT abdomen/pelvis -significant for mild diverticulitis.  She was prescribed Augmentin and ondansetron to use prn. She took full abx course. She is feeling better, yet still has occasional nausea.  .       Past Medical History:  Diagnosis Date   Anxiety    Aortic stenosis    Echo 07/05/16: Very mild AS, Mean gradient 10 mm Hg, Peak gradient (S) 19 mmHg    Arthritis    left hip and back   Bronchiectasis    isolated to RML; status post right middle lobe partial resection.   CHF (congestive heart failure) (HCC)    Chronic kidney disease    Complication of anesthesia    hard to wake up   Dyspnea    Gall stones    GERD (gastroesophageal reflux disease)    history    H/O osteoporosis    Heart murmur    hasn't been heard lately   Hypertension    Hyperthyroidism    endocrinologist - Dr. Talmage Nap   Pneumonia    Yeast infection      Family History  Problem Relation Age of Onset   Hypertension  Mother    CVA Mother 60   Heart attack Sister    Diabetes Sister    Healthy Father    Heart attack Brother    Heart attack Brother      Current Outpatient Medications:    acetaminophen (TYLENOL) 650 MG CR tablet, Take 1,300 mg by mouth every 8 (eight) hours as needed for pain., Disp: , Rfl:    Ascorbic Acid (VITAMIN C) 500 MG CAPS, Take 500 mg by mouth daily., Disp: , Rfl:    aspirin EC 81 MG tablet, Take 1 tablet (81 mg total) by mouth 2 (two) times daily., Disp: 60 tablet, Rfl: 0   busPIRone (BUSPAR) 5 MG tablet, TAKE 1 TABLET(5 MG) BY MOUTH DAILY AS NEEDED FOR ANXIETY, Disp: 90 tablet, Rfl: 1   calcium carbonate (OS-CAL) 600 MG TABS tablet, Take 600 mg by mouth daily., Disp: , Rfl:    cholecalciferol (VITAMIN D3) 25 MCG (1000 UT) tablet, Take 1,000 Units by mouth daily., Disp: , Rfl:    diclofenac Sodium (VOLTAREN) 1 % GEL, APPLY 2 GRAMS 4 TIMES DAILY AS DIRECTED, Disp: 500 g, Rfl: 1   fenofibrate (TRICOR) 145 MG tablet, TAKE 1 TABLET BY MOUTH EVERY DAY, Disp: 90 tablet, Rfl: 3   furosemide (LASIX) 20 MG tablet, TAKE  1 TABLET BY MOUTH ONCE DAILY, Disp: 90 tablet, Rfl: 3   methimazole (TAPAZOLE) 5 MG tablet, Take 2.5 mg by mouth daily. , Disp: , Rfl:    metoprolol succinate (TOPROL-XL) 25 MG 24 hr tablet, Take 1 tablet (25 mg total) by mouth daily., Disp: 90 tablet, Rfl: 3   oxybutynin (DITROPAN-XL) 10 MG 24 hr tablet, Take 10 mg by mouth daily., Disp: , Rfl:    potassium chloride SA (KLOR-CON M) 20 MEQ tablet, TAKE 1 TABLET BY MOUTH EVERY DAY, Disp: 90 tablet, Rfl: 3   famotidine (PEPCID) 20 MG tablet, TAKE 1 TABLET(20 MG) BY MOUTH DAILY, Disp: 90 tablet, Rfl: 2   nystatin cream (MYCOSTATIN), Apply topically 2 (two) times daily as needed for dry skin., Disp: 30 g, Rfl: 1   ondansetron (ZOFRAN-ODT) 4 MG disintegrating tablet, Take 1 tablet (4 mg total) by mouth every 8 (eight) hours as needed. (Patient not taking: Reported on 12/11/2023), Disp: 20 tablet, Rfl: 0   oxyCODONE-acetaminophen  (PERCOCET/ROXICET) 5-325 MG tablet, Take 1 tablet by mouth every 6 (six) hours as needed for severe pain (pain score 7-10). (Patient not taking: Reported on 12/11/2023), Disp: 15 tablet, Rfl: 0   Allergies  Allergen Reactions   Ibuprofen Other (See Comments)    hallucinations Other reaction(s): hallucinations   Naproxen Sodium Other (See Comments)    hallucinations   Nitrofurantoin Rash   Oxybutynin Rash     Review of Systems  Constitutional: Negative.   HENT:         She adds she has bruising to her right eye that occurred 2-3 days ago. She was reading to her great grand babies & she accidentally fell asleep with her glasses on. She awakened with a black eye. No visual disturbances.  She has used both hot/cold compresses which has helped with the swelling and bruising.   Respiratory: Negative.    Cardiovascular: Negative.   Gastrointestinal: Negative.   Neurological: Negative.   Psychiatric/Behavioral: Negative.       Today's Vitals   12/11/23 1433  BP: 90/60  Pulse: 67  Temp: 98.2 F (36.8 C)  SpO2: 98%  Weight: 195 lb (88.5 kg)  Height: 5\' 1"  (1.549 m)   Body mass index is 36.84 kg/m.  Wt Readings from Last 3 Encounters:  12/11/23 195 lb (88.5 kg)  11/26/23 197 lb (89.4 kg)  09/02/23 194 lb 12.8 oz (88.4 kg)     Objective:  Physical Exam Vitals and nursing note reviewed.  Constitutional:      Appearance: Normal appearance. She is obese.  HENT:     Head: Normocephalic and atraumatic.  Eyes:     Extraocular Movements: Extraocular movements intact.     Comments: Right periorbital edema and bruising  Cardiovascular:     Rate and Rhythm: Normal rate and regular rhythm.     Heart sounds: Normal heart sounds.  Pulmonary:     Effort: Pulmonary effort is normal.     Breath sounds: Normal breath sounds.  Musculoskeletal:     Cervical back: Normal range of motion.  Skin:    General: Skin is warm.     Findings: Bruising present.     Comments: Periorbital ecchymoses  on R  Neurological:     General: No focal deficit present.     Mental Status: She is alert.  Psychiatric:        Mood and Affect: Mood normal.        Behavior: Behavior normal.     IMAGING: CT  ABDOMEN PELVIS W CONTRAST Result Date: 11/26/2023 CLINICAL DATA:  Bowel obstruction suspected. Fatigue, nausea, decreased appetite. Epigastric pain for 1 week. Gallbladder removed years ago. EXAM: CT ABDOMEN AND PELVIS WITH CONTRAST TECHNIQUE: Multidetector CT imaging of the abdomen and pelvis was performed using the standard protocol following bolus administration of intravenous contrast. RADIATION DOSE REDUCTION: This exam was performed according to the departmental dose-optimization program which includes automated exposure control, adjustment of the mA and/or kV according to patient size and/or use of iterative reconstruction technique. CONTRAST:  OMNIPAQUE IOHEXOL 300 MG/ML  SOLN COMPARISON:  MRI abdomen 07/31/2021, CT abdomen 01/23/2018, CT angio abdomen and pelvis 07/01/2016, CT chest 08/19/2006 FINDINGS: Lower chest: There is again surgical suture within the right lower lung with surrounding curvilinear postsurgical change and scarring. Surgical clips within the right infrahilar region are again seen. Mild curvilinear scarring within the lingula is unchanged. Calcifications within the inferior left hilar region likely reflect calcified lymph nodes from prior remote granulomatous infection. The right main pulmonary artery measures up to 3.5 cm in caliber and the partially visualized left main pulmonary artery also measures greater than 3 cm in caliber, SMA unchanged from prior. This again can be seen with chronic pulmonary arterial hypertension. Dense aortic root and coronary artery calcifications are again seen. Hepatobiliary: Smooth liver contours. Status post cholecystectomy. No focal liver lesion is seen. Pancreas: Unremarkable. No pancreatic ductal dilatation or surrounding inflammatory changes.  Spleen: Normal in size without focal abnormality. Adrenals/Urinary Tract: Normal adrenals. The kidneys enhance uniformly and are symmetric in size without hydronephrosis. Unchanged partially exophytic 7 mm low-attenuation lesion at the posterior aspect of the left renal midpole. No follow-up imaging is recommended. No suspicious renal lesion is seen. The urinary bladder is decompressed, limiting evaluation. Streak artifact from bilateral total hip arthroplasty hardware limits evaluation of the lower pelvis. No gross urinary bladder abnormality is seen. Stomach/Bowel: Moderate to high-grade sigmoid and mild distal descending colon diverticulosis is similar to prior. No inflammatory change to indicate acute diverticulitis. A few diverticula are again seen within the transverse colon. Mild air-fluid levels within the ascending colon. Mild fluid within the terminal ileum without wall thickening. Normal appendix (axial images 51 through 58). Tiny sliding hiatal hernia similar to prior. No dilated loops of bowel are seen to indicate bowel obstruction. There is minimal inflammatory stranding within the fat posterior to the proximal ascending colon in the region of a few small diverticula, mild diverticulitis (axial series 301, images 41 through 44 and coronal images 71 through 73). Vascular/Lymphatic: No abdominal aortic aneurysm. Dense atherosclerotic calcifications. The major intra-abdominal aortic branch vessels are patent. No pathologically enlarged mesenteric, retroperitoneal, or pelvic lymph nodes. Reproductive: The uterus again appears to be surgically absent no adnexal mass is seen. Other: No ventral abdominal hernia. No free air or free fluid is seen within the abdomen or pelvis. Musculoskeletal: Mild dextrocurvature of the mid thoracic spine. Moderate to severe multilevel disc space narrowing, endplate sclerosis, peripheral osteophytosis, and vacuum phenomenon throughout the visualized lower thoracic and lumbar  spine. Status post bilateral total hip arthroplasty. A sclerotic focus within the right ilium adjacent to the sacroiliac joints is unchanged from 07/01/2016 and benign, likely a bone island. IMPRESSION: 1. Mild acute uncomplicated diverticulitis of the proximal ascending colon. Only mild ascending and transverse colon diverticulosis. 2. Moderate to high-grade sigmoid and mild distal descending colon diverticulosis without evidence of acute diverticulitis. 3. No evidence of bowel obstruction. 4. Stable dilatation of the main pulmonary arteries, which can be seen  with chronic pulmonary arterial hypertension. Electronically Signed   By: Neita Garnet M.D.   On: 11/26/2023 14:39     Assessment And Plan:  Diverticulitis Assessment & Plan: ED records reviewed in detail.  She has completed full course of abx. She is encouraged to decrease intake of nuts, popcorn and seeds. She is advised to gradually increase her diet. She will let me know if she has persistent symptoms. All questions were answered to her satisfaction.    Periorbital hematoma of right eye Assessment & Plan: She was advised to continue to apply warm compresses to the affected eye two to three times daily.  She will let me know if her sx progress.    Class 2 severe obesity due to excess calories with serious comorbidity and body mass index (BMI) of 36.0 to 36.9 in adult [facility]) Assessment & Plan: She is encouraged to strive for BMI less than 30 to decrease cardiac risk. Advised to aim for at least 150 minutes of exercise per week.    Other orders -     Nystatin; Apply topically 2 (two) times daily as needed for dry skin.  Dispense: 30 g; Refill: 1  She is encouraged to strive for BMI less than 30 to decrease cardiac risk. Advised to aim for at least 150 minutes of exercise per week.    Return in 4 weeks (on 01/08/2024), or lab visit ( 3 weeks).  Patient was given opportunity to ask questions. Patient verbalized understanding of the plan  and was able to repeat key elements of the plan. All questions were answered to their satisfaction.    I, Savannah Aliment, MD, have reviewed all documentation for this visit. The documentation on 12/11/23 for the exam, diagnosis, procedures, and orders are all accurate and complete.   IF YOU HAVE BEEN REFERRED TO A SPECIALIST, IT MAY TAKE 1-2 WEEKS TO SCHEDULE/PROCESS THE REFERRAL. IF YOU HAVE NOT HEARD FROM US/SPECIALIST IN TWO WEEKS, PLEASE GIVE [place] A CALL AT [phone].   THE PATIENT IS ENCOURAGED TO PRACTICE SOCIAL DISTANCING DUE TO THE COVID-19 PANDEMIC.

## 2023-12-18 ENCOUNTER — Telehealth: Payer: Self-pay

## 2023-12-18 NOTE — Telephone Encounter (Signed)
 Called re: PREP program she is unsure about the drive to [place], but wants to meet with me on March 18 at noon for assessment visit with plans to start on March 25, every T/Th at 12 noon.

## 2023-12-29 DIAGNOSIS — K5792 Diverticulitis of intestine, part unspecified, without perforation or abscess without bleeding: Secondary | ICD-10-CM | POA: Insufficient documentation

## 2023-12-29 DIAGNOSIS — S0011XA Contusion of right eyelid and periocular area, initial encounter: Secondary | ICD-10-CM | POA: Insufficient documentation

## 2023-12-29 NOTE — Assessment & Plan Note (Signed)
 She is encouraged to strive for BMI less than 30 to decrease cardiac risk. Advised to aim for at least 150 minutes of exercise per week.

## 2023-12-29 NOTE — Assessment & Plan Note (Signed)
 ED records reviewed in detail.  She has completed full course of abx. She is encouraged to decrease intake of nuts, popcorn and seeds. She is advised to gradually increase her diet. She will let me know if she has persistent symptoms. All questions were answered to her satisfaction.

## 2023-12-29 NOTE — Assessment & Plan Note (Signed)
 She was advised to continue to apply warm compresses to the affected eye two to three times daily.  She will let me know if her sx progress.

## 2024-01-01 ENCOUNTER — Other Ambulatory Visit

## 2024-01-01 ENCOUNTER — Other Ambulatory Visit: Payer: Self-pay | Admitting: Internal Medicine

## 2024-01-01 DIAGNOSIS — H05231 Hemorrhage of right orbit: Secondary | ICD-10-CM | POA: Diagnosis not present

## 2024-01-01 DIAGNOSIS — K5792 Diverticulitis of intestine, part unspecified, without perforation or abscess without bleeding: Secondary | ICD-10-CM

## 2024-01-02 ENCOUNTER — Encounter: Payer: Self-pay | Admitting: Internal Medicine

## 2024-01-02 LAB — CMP14+EGFR
ALT: 8 IU/L (ref 0–32)
AST: 15 IU/L (ref 0–40)
Albumin: 3.9 g/dL (ref 3.7–4.7)
Alkaline Phosphatase: 56 IU/L (ref 44–121)
BUN/Creatinine Ratio: 18 (ref 12–28)
BUN: 12 mg/dL (ref 8–27)
Bilirubin Total: 0.4 mg/dL (ref 0.0–1.2)
CO2: 20 mmol/L (ref 20–29)
Calcium: 9 mg/dL (ref 8.7–10.3)
Chloride: 107 mmol/L — ABNORMAL HIGH (ref 96–106)
Creatinine, Ser: 0.66 mg/dL (ref 0.57–1.00)
Globulin, Total: 3 g/dL (ref 1.5–4.5)
Glucose: 86 mg/dL (ref 70–99)
Potassium: 4.2 mmol/L (ref 3.5–5.2)
Sodium: 141 mmol/L (ref 134–144)
Total Protein: 6.9 g/dL (ref 6.0–8.5)
eGFR: 84 mL/min/{1.73_m2} (ref >59)

## 2024-01-02 LAB — CBC
Hematocrit: 35.9 % (ref 34.0–46.6)
Hemoglobin: 11.7 g/dL (ref 11.1–15.9)
MCH: 27.2 pg (ref 26.6–33.0)
MCHC: 32.6 g/dL (ref 31.5–35.7)
MCV: 84 fL (ref 79–97)
Platelets: 350 10*3/uL (ref 150–450)
RBC: 4.3 x10E6/uL (ref 3.77–5.28)
RDW: 15.3 % (ref 11.7–15.4)
WBC: 4.4 10*3/uL (ref 3.4–10.8)

## 2024-01-13 DIAGNOSIS — E049 Nontoxic goiter, unspecified: Secondary | ICD-10-CM | POA: Diagnosis not present

## 2024-01-13 DIAGNOSIS — I10 Essential (primary) hypertension: Secondary | ICD-10-CM | POA: Diagnosis not present

## 2024-01-13 DIAGNOSIS — E0590 Thyrotoxicosis, unspecified without thyrotoxic crisis or storm: Secondary | ICD-10-CM | POA: Diagnosis not present

## 2024-01-23 ENCOUNTER — Other Ambulatory Visit: Payer: Self-pay

## 2024-01-23 MED ORDER — FUROSEMIDE 20 MG PO TABS: 20.0000 mg | ORAL_TABLET | Freq: Every day | ORAL | 3 refills | Status: DC

## 2024-02-27 ENCOUNTER — Ambulatory Visit: Payer: Medicare Other | Admitting: Internal Medicine

## 2024-03-03 ENCOUNTER — Other Ambulatory Visit: Payer: Self-pay | Admitting: Internal Medicine

## 2024-03-03 DIAGNOSIS — E049 Nontoxic goiter, unspecified: Secondary | ICD-10-CM | POA: Diagnosis not present

## 2024-03-03 DIAGNOSIS — E0590 Thyrotoxicosis, unspecified without thyrotoxic crisis or storm: Secondary | ICD-10-CM | POA: Diagnosis not present

## 2024-03-03 DIAGNOSIS — I10 Essential (primary) hypertension: Secondary | ICD-10-CM | POA: Diagnosis not present

## 2024-03-04 ENCOUNTER — Ambulatory Visit: Admitting: Internal Medicine

## 2024-03-04 ENCOUNTER — Encounter: Payer: Self-pay | Admitting: Internal Medicine

## 2024-03-04 VITALS — BP 110/60 | HR 85 | Temp 97.6°F | Ht 61.0 in | Wt 200.4 lb

## 2024-03-04 DIAGNOSIS — I700 Atherosclerosis of aorta: Secondary | ICD-10-CM

## 2024-03-04 DIAGNOSIS — I350 Nonrheumatic aortic (valve) stenosis: Secondary | ICD-10-CM | POA: Diagnosis not present

## 2024-03-04 DIAGNOSIS — I130 Hypertensive heart and chronic kidney disease with heart failure and stage 1 through stage 4 chronic kidney disease, or unspecified chronic kidney disease: Secondary | ICD-10-CM

## 2024-03-04 DIAGNOSIS — N182 Chronic kidney disease, stage 2 (mild): Secondary | ICD-10-CM

## 2024-03-04 DIAGNOSIS — I5032 Chronic diastolic (congestive) heart failure: Secondary | ICD-10-CM

## 2024-03-04 DIAGNOSIS — N3281 Overactive bladder: Secondary | ICD-10-CM

## 2024-03-04 DIAGNOSIS — G44209 Tension-type headache, unspecified, not intractable: Secondary | ICD-10-CM

## 2024-03-04 NOTE — Progress Notes (Signed)
 I,Victoria T Basil Lim, CMA,acting as a Neurosurgeon for Smiley Dung, MD.,have documented all relevant documentation on the behalf of Smiley Dung, MD,as directed by  Smiley Dung, MD while in the presence of Smiley Dung, MD.  Subjective:  Patient ID: [name] , female    DOB: [birth year] , 89 y.o.   MRN: [id number]  Chief Complaint  Patient presents with   Hypertension    Patient presents today for a bp & chol check. Patient complains of her head was hurting last night, she can feel it now but it is not as bad as last night. Denies chest pain & has little sob. Patient has not scheduled her mammogram as of yet.   Chronic Kidney Disease    HPI Discussed the use of AI scribe software for clinical note transcription with the patient, who gave verbal consent to proceed.  History of Present Illness [name] is an 89 year old female with hypertension who presents for a blood pressure check and to f/u indigestion.    She has a history of hypertension and is currently taking metoprolol  daily. She does not regularly monitor her blood pressure at home, only checking it when she feels unwell. She experienced a headache since Friday, which has since resolved. The headache occurred while she was doing chores around the house and yard, and she admits to not drinking enough water  during this time. The headache was described as being on the side of her head and typically resolves with rest, ice application, and hydration.  She experiences occasional shortness of breath, which she attributes to feeling panicky. She calms down when she sits and reassures herself. She is also taking buspirone  for anxiety, especially during stressful times, such as dealing with family members in nursing homes.  She has a history of bladder issues, characterized by frequent urination and leakage. She previously tried oxybutynin, which did not alleviate her symptoms. She is open to trying other treatments.  Her  current medications include metoprolol , methimazole , furosemide , famotidine , buspirone , calcium , and fenofibrate . She recently adjusted her methimazole  dose to one tablet daily and resumed taking furosemide  after a brief period of confusion about her medication regimen.   Hypertension This is a chronic problem. The current episode started more than 1 year ago. The problem has been gradually improving since onset. The problem is controlled. Associated symptoms include headaches. Pertinent negatives include no blurred vision. Risk factors for coronary artery disease include dyslipidemia, obesity and post-menopausal state. Past treatments include beta blockers. The current treatment provides moderate improvement. Compliance problems include exercise.      Past Medical History:  Diagnosis Date   Anxiety    Aortic stenosis    Echo 07/05/16: Very mild AS, Mean gradient 10 mm Hg, Peak gradient (S) 19 mmHg    Arthritis    left hip and back   Bronchiectasis    isolated to RML; status post right middle lobe partial resection.   CHF (congestive heart failure) (HCC)    Chronic kidney disease    Complication of anesthesia    hard to wake up   Dyspnea    Gall stones    GERD (gastroesophageal reflux disease)    history    H/O osteoporosis    Heart murmur    hasn't been heard lately   Hypertension    Hyperthyroidism    endocrinologist - Dr. Ronelle Coffee   Pneumonia    Yeast infection      Family History  Problem Relation Age  of Onset   Hypertension Mother    CVA Mother 74   Heart attack Sister    Diabetes Sister    Healthy Father    Heart attack Brother    Heart attack Brother      Current Outpatient Medications:    acetaminophen  (TYLENOL ) 650 MG CR tablet, Take 1,300 mg by mouth every 8 (eight) hours as needed for pain., Disp: , Rfl:    Ascorbic Acid (VITAMIN C) 500 MG CAPS, Take 500 mg by mouth daily., Disp: , Rfl:    aspirin  EC 81 MG tablet, Take 1 tablet (81 mg total) by mouth 2 (two)  times daily., Disp: 60 tablet, Rfl: 0   busPIRone  (BUSPAR ) 5 MG tablet, TAKE 1 TABLET(5 MG) BY MOUTH DAILY AS NEEDED FOR ANXIETY, Disp: 90 tablet, Rfl: 1   calcium  carbonate (OS-CAL) 600 MG TABS tablet, Take 600 mg by mouth daily., Disp: , Rfl:    cholecalciferol  (VITAMIN D3) 25 MCG (1000 UT) tablet, Take 1,000 Units by mouth daily., Disp: , Rfl:    diclofenac  Sodium (VOLTAREN ) 1 % GEL, APPLY 2 GRAMS 4 TIMES DAILY AS DIRECTED, Disp: 500 g, Rfl: 1   fenofibrate  (TRICOR ) 145 MG tablet, TAKE 1 TABLET BY MOUTH EVERY DAY, Disp: 90 tablet, Rfl: 3   furosemide  (LASIX ) 20 MG tablet, Take 1 tablet (20 mg total) by mouth daily., Disp: 90 tablet, Rfl: 3   methimazole  (TAPAZOLE ) 5 MG tablet, Take 2.5 mg by mouth daily. , Disp: , Rfl:    metoprolol  succinate (TOPROL -XL) 25 MG 24 hr tablet, TAKE 1 TABLET(25 MG) BY MOUTH DAILY, Disp: 90 tablet, Rfl: 3   nystatin  cream (MYCOSTATIN ), Apply topically 2 (two) times daily as needed for dry skin., Disp: 30 g, Rfl: 1   ondansetron  (ZOFRAN -ODT) 4 MG disintegrating tablet, Take 1 tablet (4 mg total) by mouth every 8 (eight) hours as needed., Disp: 20 tablet, Rfl: 0   oxybutynin (DITROPAN-XL) 10 MG 24 hr tablet, Take 10 mg by mouth daily., Disp: , Rfl:    oxyCODONE -acetaminophen  (PERCOCET/ROXICET) 5-325 MG tablet, Take 1 tablet by mouth every 6 (six) hours as needed for severe pain (pain score 7-10)., Disp: 15 tablet, Rfl: 0   famotidine  (PEPCID ) 20 MG tablet, TAKE 1 TABLET(20 MG) BY MOUTH DAILY (Patient not taking: Reported on 03/04/2024), Disp: 90 tablet, Rfl: 2   potassium chloride  SA (KLOR-CON  M) 20 MEQ tablet, TAKE 1 TABLET BY MOUTH EVERY DAY, Disp: 90 tablet, Rfl: 3   Allergies  Allergen Reactions   Ibuprofen Other (See Comments)    hallucinations Other reaction(s): hallucinations   Naproxen Sodium Other (See Comments)    hallucinations   Nitrofurantoin Rash   Oxybutynin Rash     Review of Systems  Constitutional: Negative.   Eyes:  Negative for blurred  vision.  Respiratory: Negative.    Cardiovascular: Negative.   Gastrointestinal: Negative.   Neurological:  Positive for headaches.  Psychiatric/Behavioral: Negative.       Today's Vitals   03/04/24 1132  BP: 110/60  Pulse: 85  Temp: 97.6 F (36.4 C)  SpO2: 98%  Weight: 200 lb 6.4 oz (90.9 kg)  Height: 5\' 1"  (1.549 m)   Body mass index is 37.87 kg/m.  Wt Readings from Last 3 Encounters:  03/04/24 200 lb 6.4 oz (90.9 kg)  12/11/23 195 lb (88.5 kg)  11/26/23 197 lb (89.4 kg)     Objective:  Physical Exam Vitals and nursing note reviewed.  Constitutional:      Appearance: Normal  appearance. She is obese.  HENT:     Head: Normocephalic and atraumatic.  Eyes:     Extraocular Movements: Extraocular movements intact.  Cardiovascular:     Rate and Rhythm: Normal rate and regular rhythm.     Heart sounds: Normal heart sounds.  Pulmonary:     Effort: Pulmonary effort is normal.     Breath sounds: Normal breath sounds.  Abdominal:     General: Bowel sounds are normal.     Palpations: Abdomen is soft.     Comments: Neg CVA tenderness  Musculoskeletal:     Cervical back: Normal range of motion.  Skin:    General: Skin is warm.  Neurological:     General: No focal deficit present.     Mental Status: She is alert.  Psychiatric:        Mood and Affect: Mood normal.        Behavior: Behavior normal.         Assessment And Plan:  Hypertensive heart and renal disease with renal failure, stage 1 through stage 4 or unspecified chronic kidney disease, with heart failure (HCC) Assessment & Plan: Chronic, well controlled. She is encouraged to follow a low sodium diet.  She will continue with metoprolol  XL 25mg  daily. She will f/u in four to six months for re-evaluation. Long-standing hypertension with stiff heart function and valve calcification. No echocardiogram since 2017. Recent headache likely due to dehydration and tension, not hypertension. - Schedule echocardiogram to  assess heart function and valve calcification. - Advise regular blood pressure monitoring twice a week. - Continue metoprolol  as prescribed.   Diastolic CHF, chronic (HCC) Assessment & Plan: Chronic, last echo was in 2017.  She had nl EF and grade 2 diastolic dysfunction. Encouraged to follow low sodium diet. Cardiology input appreciated, no need for med changes.    Atherosclerosis of aorta (HCC) Assessment & Plan: Chronic, LDL goal is less than 70. She is statin intolerant. She will continue with fenofibrate  145mg  daily. Encouraged to follow a heart healthy diet.    Chronic renal disease, stage II Assessment & Plan: Chronic, she is encouraged to keep BP well controlled and to stay well hydrated to decrease risk of CKD progression.     Overactive bladder Assessment & Plan: Chronic bladder dysfunction with frequent urination and urinary incontinence. Oxybutynin was ineffective. - Refer to physical therapy for bladder dysfunction management. - Provide samples of alternative medication for bladder dysfunction. - Obtain urine sample to rule out infection. - Consider Myrbetriq  Orders: -     Urinalysis, Complete  Tension headache Assessment & Plan: Intermittent tension headaches likely related to stress and dehydration. Recent headache resolved with rest, ice, and hydration. - Advise hydration and stress management techniques. - Monitor for recurrence and check blood pressure during headaches.   Mild aortic stenosis by prior echocardiogram -     ECHOCARDIOGRAM COMPLETE; Future   Return in 4 months (on 07/05/2024), or bp check.  Patient was given opportunity to ask questions. Patient verbalized understanding of the plan and was able to repeat key elements of the plan. All questions were answered to their satisfaction.    I, Smiley Dung, MD, have reviewed all documentation for this visit. The documentation on 03/04/24 for the exam, diagnosis, procedures, and orders are all  accurate and complete.   IF YOU HAVE BEEN REFERRED TO A SPECIALIST, IT MAY TAKE 1-2 WEEKS TO SCHEDULE/PROCESS THE REFERRAL. IF YOU HAVE NOT HEARD FROM US /SPECIALIST IN TWO WEEKS, PLEASE  GIVE US  A CALL AT [phone].   THE PATIENT IS ENCOURAGED TO PRACTICE SOCIAL DISTANCING DUE TO THE COVID-19 PANDEMIC.

## 2024-03-04 NOTE — Assessment & Plan Note (Signed)
 Chronic, she is encouraged to keep BP well controlled and to stay well hydrated to decrease risk of CKD progression.

## 2024-03-04 NOTE — Assessment & Plan Note (Signed)
 Chronic, LDL goal is less than 70. She is statin intolerant. She will continue with fenofibrate 145mg  daily. Encouraged to follow a heart healthy diet.

## 2024-03-04 NOTE — Assessment & Plan Note (Signed)
 Chronic, last echo was in 2017.  She had nl EF and grade 2 diastolic dysfunction. Encouraged to follow low sodium diet. Cardiology input appreciated, no need for med changes.

## 2024-03-04 NOTE — Patient Instructions (Signed)
 Hypertension, Adult Hypertension is another name for high blood pressure. High blood pressure forces your heart to work harder to pump blood. This can cause problems over time. There are two numbers in a blood pressure reading. There is a top number (systolic) over a bottom number (diastolic). It is best to have a blood pressure that is below 120/80. What are the causes? The cause of this condition is not known. Some other conditions can lead to high blood pressure. What increases the risk? Some lifestyle factors can make you more likely to develop high blood pressure: Smoking. Not getting enough exercise or physical activity. Being overweight. Having too much fat, sugar, calories, or salt (sodium) in your diet. Drinking too much alcohol. Other risk factors include: Having any of these conditions: Heart disease. Diabetes. High cholesterol. Kidney disease. Obstructive sleep apnea. Having a family history of high blood pressure and high cholesterol. Age. The risk increases with age. Stress. What are the signs or symptoms? High blood pressure may not cause symptoms. Very high blood pressure (hypertensive crisis) may cause: Headache. Fast or uneven heartbeats (palpitations). Shortness of breath. Nosebleed. Vomiting or feeling like you may vomit (nauseous). Changes in how you see. Very bad chest pain. Feeling dizzy. Seizures. How is this treated? This condition is treated by making healthy lifestyle changes, such as: Eating healthy foods. Exercising more. Drinking less alcohol. Your doctor may prescribe medicine if lifestyle changes do not help enough and if: Your top number is above 130. Your bottom number is above 80. Your personal target blood pressure may vary. Follow these instructions at home: Eating and drinking  If told, follow the DASH eating plan. To follow this plan: Fill one half of your plate at each meal with fruits and vegetables. Fill one fourth of your plate  at each meal with whole grains. Whole grains include whole-wheat pasta, brown rice, and whole-grain bread. Eat or drink low-fat dairy products, such as skim milk or low-fat yogurt. Fill one fourth of your plate at each meal with low-fat (lean) proteins. Low-fat proteins include fish, chicken without skin, eggs, beans, and tofu. Avoid fatty meat, cured and processed meat, or chicken with skin. Avoid pre-made or processed food. Limit the amount of salt in your diet to less than 1,500 mg each day. Do not drink alcohol if: Your doctor tells you not to drink. You are pregnant, may be pregnant, or are planning to become pregnant. If you drink alcohol: Limit how much you have to: 0-1 drink a day for women. 0-2 drinks a day for men. Know how much alcohol is in your drink. In the [place] one drink equals one 12 oz bottle of beer (355 mL), one 5 oz glass of wine (148 mL), or one 1 oz glass of hard liquor (44 mL). Lifestyle  Work with your doctor to stay at a healthy weight or to lose weight. Ask your doctor what the best weight is for you. Get at least 30 minutes of exercise that causes your heart to beat faster (aerobic exercise) most days of the week. This may include walking, swimming, or biking. Get at least 30 minutes of exercise that strengthens your muscles (resistance exercise) at least 3 days a week. This may include lifting weights or doing Pilates. Do not smoke or use any products that contain nicotine or tobacco. If you need help quitting, ask your doctor. Check your blood pressure at home as told by your doctor. Keep all follow-up visits. Medicines Take over-the-counter and prescription medicines  only as told by your doctor. Follow directions carefully. Do not skip doses of blood pressure medicine. The medicine does not work as well if you skip doses. Skipping doses also puts you at risk for problems. Ask your doctor about side effects or reactions to medicines that you should watch  for. Contact a doctor if: You think you are having a reaction to the medicine you are taking. You have headaches that keep coming back. You feel dizzy. You have swelling in your ankles. You have trouble with your vision. Get help right away if: You get a very bad headache. You start to feel mixed up (confused). You feel weak or numb. You feel faint. You have very bad pain in your: Chest. Belly (abdomen). You vomit more than once. You have trouble breathing. These symptoms may be an emergency. Get help right away. Call 911. Do not wait to see if the symptoms will go away. Do not drive yourself to the hospital. Summary Hypertension is another name for high blood pressure. High blood pressure forces your heart to work harder to pump blood. For most people, a normal blood pressure is less than 120/80. Making healthy choices can help lower blood pressure. If your blood pressure does not get lower with healthy choices, you may need to take medicine. This information is not intended to replace advice given to you by your health care provider. Make sure you discuss any questions you have with your health care provider. Document Revised: 07/13/2021 Document Reviewed: 07/13/2021 Elsevier Patient Education  2024 [employer].

## 2024-03-05 ENCOUNTER — Ambulatory Visit: Payer: Self-pay | Admitting: Internal Medicine

## 2024-03-05 ENCOUNTER — Ambulatory Visit (HOSPITAL_COMMUNITY)
Admission: RE | Admit: 2024-03-05 | Discharge: 2024-03-05 | Disposition: A | Discharge status: Home / Self Care | Source: Ambulatory Visit | Attending: Internal Medicine | Admitting: Internal Medicine

## 2024-03-05 DIAGNOSIS — I350 Nonrheumatic aortic (valve) stenosis: Secondary | ICD-10-CM | POA: Diagnosis not present

## 2024-03-05 LAB — ECHOCARDIOGRAM COMPLETE
AR max vel: 1.56 cm2
AV Area VTI: 1.54 cm2
AV Area mean vel: 1.54 cm2
AV Mean grad: 13.2 mmHg
AV Peak grad: 25 mmHg
Ao pk vel: 2.5 m/s
Area-P 1/2: 2.45 cm2
S' Lateral: 2.2 cm

## 2024-03-09 DIAGNOSIS — N3281 Overactive bladder: Secondary | ICD-10-CM | POA: Insufficient documentation

## 2024-03-09 DIAGNOSIS — G44209 Tension-type headache, unspecified, not intractable: Secondary | ICD-10-CM | POA: Insufficient documentation

## 2024-03-09 NOTE — Assessment & Plan Note (Signed)
 Intermittent tension headaches likely related to stress and dehydration. Recent headache resolved with rest, ice, and hydration. - Advise hydration and stress management techniques. - Monitor for recurrence and check blood pressure during headaches.

## 2024-03-09 NOTE — Assessment & Plan Note (Signed)
 Chronic, well controlled. She is encouraged to follow a low sodium diet.  She will continue with metoprolol  XL 25mg  daily. She will f/u in four to six months for re-evaluation. Long-standing hypertension with stiff heart function and valve calcification. No echocardiogram since 2017. Recent headache likely due to dehydration and tension, not hypertension. - Schedule echocardiogram to assess heart function and valve calcification. - Advise regular blood pressure monitoring twice a week. - Continue metoprolol  as prescribed.

## 2024-03-09 NOTE — Assessment & Plan Note (Addendum)
 Chronic bladder dysfunction with frequent urination and urinary incontinence. Oxybutynin was ineffective. - Refer to physical therapy for bladder dysfunction management. - Provide samples of alternative medication for bladder dysfunction. - Obtain urine sample to rule out infection. - Consider Myrbetriq

## 2024-03-10 DIAGNOSIS — I10 Essential (primary) hypertension: Secondary | ICD-10-CM | POA: Diagnosis not present

## 2024-03-10 DIAGNOSIS — R748 Abnormal levels of other serum enzymes: Secondary | ICD-10-CM | POA: Diagnosis not present

## 2024-03-10 DIAGNOSIS — E0590 Thyrotoxicosis, unspecified without thyrotoxic crisis or storm: Secondary | ICD-10-CM | POA: Diagnosis not present

## 2024-04-06 ENCOUNTER — Telehealth: Payer: Self-pay

## 2024-04-06 NOTE — Telephone Encounter (Signed)
 Spoke with her about new PREP program starting at [facility] at 7/25, she is planning to join for it. Will meet with her July 8 at 10 for initial assessment.

## 2024-04-14 NOTE — Progress Notes (Signed)
 [facility] PREP Evaluation  Patient Details  Name: [name] MRN: [id number] Date of Birth: 18-Aug-1933 Age: 89 y.o. PCP: Jarold Medici, MD  Vitals:   04/14/24 1036  BP: 138/77  SpO2: 98%  Weight: 205 lb 3.2 oz (93.1 kg)     [facility] Eval - 04/14/24 1000       [facility] PREP Location   [facility] PREP Location [facility]      Referral    Referring Provider Jarold    Reason for referral Hypertension;High Cholesterol;Inactivity;Obesitity/Overweight    Program Start Date 04/21/24      Measurement   Waist Circumference 41 inches    Hip Circumference 44.5 inches    Body fat 46.4 percent      Information for Trainer   Goals --   Lose weight, tone up   Current Exercise --   Some walking   Orthopedic Concerns Knees    Pertinent Medical History --   Hypertension, High cholesterol, obesity   Current Barriers --   Knees may bother her        Past Medical History:  Diagnosis Date   Anxiety    Aortic stenosis    Echo 07/05/16: Very mild AS, Mean gradient 10 mm Hg, Peak gradient (S) 19 mmHg    Arthritis    left hip and back   Bronchiectasis    isolated to RML; status post right middle lobe partial resection.   CHF (congestive heart failure) (HCC)    Chronic kidney disease    Complication of anesthesia    hard to wake up   Dyspnea    Gall stones    GERD (gastroesophageal reflux disease)    history    H/O osteoporosis    Heart murmur    hasn't been heard lately   Hypertension    Hyperthyroidism    endocrinologist - Dr. Tommas   Pneumonia    Yeast infection    Past Surgical History:  Procedure Laterality Date   ABDOMINAL HYSTERECTOMY  1974   CHOLECYSTECTOMY N/A 07/02/2016   Procedure: LAPAROSCOPIC CHOLECYSTECTOMY;  Surgeon: Jina Nephew, MD;  Location: [facility] OR;  Service: General;  Laterality: N/A;   COLONOSCOPY     ENDOSCOPIC RETROGRADE CHOLANGIOPANCREATOGRAPHY (ERCP) WITH PROPOFOL  N/A 08/07/2018   Procedure: ENDOSCOPIC RETROGRADE CHOLANGIOPANCREATOGRAPHY (ERCP) WITH  PROPOFOL ;  Surgeon: Rollin Dover, MD;  Location: [facility];  Service: Endoscopy;  Laterality: N/A;   EYE SURGERY     stye lanced   HARDWARE REMOVAL Left 02/24/2020   Procedure: Hardware Removal, left knee;  Surgeon: Barbarann Oneil BROCKS, MD;  Location: [facility] OR;  Service: Orthopedics;  Laterality: Left;   HERNIA REPAIR  1975   KNEE SURGERY Left    LUNG REMOVAL, PARTIAL  11/2006   RML   NM MYOVIEW  LTD  06/2011   Normal EF. No ischemia or infarction.   SPHINCTEROTOMY  08/07/2018   Procedure: SPHINCTEROTOMY;  Surgeon: Rollin Dover, MD;  Location: [facility];  Service: Endoscopy;;  balloon sweep   STERIOD INJECTION Right 02/24/2020   Procedure: RIGHT KNEE STEROID INTRA-ARTICULAR MARCAINE /DEPO-MEDROL  INJECTION UNDER ANESTHESIA;  Surgeon: Barbarann Oneil BROCKS, MD;  Location: [facility] OR;  Service: Orthopedics;  Laterality: Right;   TOTAL HIP ARTHROPLASTY Left 04/05/2017   Procedure: LEFT TOTAL HIP ARTHROPLASTY ANTERIOR APPROACH;  Surgeon: Barbarann Oneil BROCKS, MD;  Location: [facility] OR;  Service: Orthopedics;  Laterality: Left;   TOTAL HIP ARTHROPLASTY Right 12/19/2018   Procedure: RIGHT TOTAL HIP ARTHROPLASTY-DIRECT ANTERIOR APPROACH;  Surgeon: Barbarann Oneil BROCKS, MD;  Location: [facility]  OR;  Service: Orthopedics;  Laterality: Right;   TOTAL KNEE ARTHROPLASTY Left 02/24/2020   Procedure: LEFT TOTAL KNEE ARTHROPLASTY;  Surgeon: Barbarann Oneil BROCKS, MD;  Location: [facility] OR;  Service: Orthopedics;  Laterality: Left;   TOTAL KNEE REVISION Left 03/18/2023   Procedure: LEFT TOTAL KNEE REVISION;  Surgeon: Liam Lerner, MD;  Location: [facility]S;  Service: Orthopedics;  Laterality: Left;   TRANSTHORACIC ECHOCARDIOGRAM  06/2011   Mild concentric LVH. Normal EF with impaired relaxation. Mildly elevated RV pressures of 30 and 40 mmHg.   TRANSTHORACIC ECHOCARDIOGRAM  06/2016   normal LV size and function. EF 60-65% with GRD 2 DD. Mild aortic stenosis. Mild LA dilation. Mild to moderately increased PA pressures (46 mmHg).   TUBAL LIGATION     Social History    Tobacco Use  Smoking Status Never  Smokeless Tobacco Never  Aira is ready to start PREP class on July 15th at 1:00 at the [facility] 04/14/2024, 10:41 AM

## 2024-04-15 ENCOUNTER — Ambulatory Visit: Payer: Self-pay

## 2024-04-15 NOTE — Telephone Encounter (Signed)
 Copied from CRM [id number]. Topic: Clinical - Red Word Triage >> Apr 15, 2024 11:27 AM Everette C wrote: [facility] that prompted transfer to Nurse Triage: The patient has experienced shortness of breath today    FYI Only or Action Required?: FYI only for provider.  Patient was last seen in primary care on 03/04/2024 by Jarold Medici, MD.  Called Nurse Triage reporting Shortness of Breath.  Symptoms began a week ago.  Interventions attempted: Rest, hydration, or home remedies.  Symptoms are: unchanged.  Triage Disposition: See HCP Within 4 Hours (Or PCP Triage)  Patient/caregiver understands and will follow disposition?: Yes     1. RESPIRATORY STATUS: Describe your breathing? (e.g., wheezing, shortness of breath, unable to speak, severe coughing)  Shortness of breath  2. ONSET: When did this breathing problem begin?  1 week 3. PATTERN Does the difficult breathing come and go, or has it been constant since it started?  Intermittent  4. SEVERITY: How bad is your breathing? (e.g., mild, moderate, severe)  - MILD: No SOB at rest, mild SOB with walking, speaks normally in sentences, can lie down, no retractions, pulse < 100.  - MODERATE: SOB at rest, SOB with minimal exertion and prefers to sit, cannot lie down flat, speaks in phrases, mild retractions, audible wheezing, pulse 100 to 120.  - SEVERE: Very SOB at rest, speaks in single words, struggling to breathe, sitting hunched forward, retractions, pulse > 120 Mild  5. RECURRENT SYMPTOM: Have you had difficulty breathing before? If Yes, ask: When was the last time? and What happened that time?  No 6. CARDIAC HISTORY: Do you have any history of heart disease? (e.g., heart attack, angina, bypass surgery, angioplasty)  Yes 7. LUNG HISTORY: Do you have any history of lung disease? (e.g., pulmonary embolus, asthma, emphysema) No 8. CAUSE: What do you think is causing the breathing problem?  Unsure  9. OTHER SYMPTOMS:  Do you have any other symptoms? (e.g., chest pain, cough, dizziness, fever, runny nose) No 10. O2 SATURATION MONITOR: Do you use an oxygen saturation monitor (pulse oximeter) at home? If Yes, ask: What is your reading (oxygen level) today? What is your usual oxygen saturation reading? (e.g., 95%) No  Reason for Disposition  [1] MILD difficulty breathing (e.g., minimal/no SOB at rest, SOB with walking, pulse < 100) AND [2] NEW-onset or WORSE than normal  Protocols used: Breathing Difficulty-A-AH

## 2024-04-16 ENCOUNTER — Ambulatory Visit (INDEPENDENT_AMBULATORY_CARE_PROVIDER_SITE_OTHER): Payer: Self-pay | Admitting: Family Medicine

## 2024-04-16 ENCOUNTER — Encounter: Payer: Self-pay | Admitting: Family Medicine

## 2024-04-16 ENCOUNTER — Telehealth: Payer: Self-pay | Admitting: Cardiology

## 2024-04-16 VITALS — BP 130/78 | HR 43 | Temp 98.2°F | Ht 61.0 in | Wt 204.0 lb

## 2024-04-16 DIAGNOSIS — I5030 Unspecified diastolic (congestive) heart failure: Secondary | ICD-10-CM

## 2024-04-16 DIAGNOSIS — N182 Chronic kidney disease, stage 2 (mild): Secondary | ICD-10-CM

## 2024-04-16 DIAGNOSIS — I130 Hypertensive heart and chronic kidney disease with heart failure and stage 1 through stage 4 chronic kidney disease, or unspecified chronic kidney disease: Secondary | ICD-10-CM

## 2024-04-16 DIAGNOSIS — I5032 Chronic diastolic (congestive) heart failure: Secondary | ICD-10-CM

## 2024-04-16 DIAGNOSIS — R0602 Shortness of breath: Secondary | ICD-10-CM

## 2024-04-16 DIAGNOSIS — R001 Bradycardia, unspecified: Secondary | ICD-10-CM

## 2024-04-16 NOTE — Telephone Encounter (Signed)
 Patient call sent straight to triage. Patient complaining of SOB with activity and low HR 43. Patient stated she was seen by NP earlier today that suggested she hold her metoprolol  if her HR is below 60 and follow-up with her cardiologist. Patient sees Dr. Anner. Made her a follow-up with PA next week. Informed her that a message would be sent to Dr. Anner to see if he wants patient just to reduce her metoprolol  or stop it until she sees the PA.  Will forward to Dr. Anner.

## 2024-04-16 NOTE — Telephone Encounter (Signed)
 STAT if HR is under 50 or over 120  (normal HR is 60-100 beats per minute)  What is your heart rate?    Do you have a log of your heart rate readings (document readings)?  130/78 43 at PCP today   Do you have any other symptoms?  SOB, weakness for the past 3 days. Occurs when up and moving around.

## 2024-04-16 NOTE — Telephone Encounter (Signed)
 I have not seen this patient in over 3 years.  She would be a new patient of I saw her back.  Reasonable to hold Toprol  if heart rate less than 50.  If this is happening frequently, would simply reduce to 1/2 tablet daily.  She can follow-up with APP.  Alm Clay

## 2024-04-16 NOTE — Progress Notes (Addendum)
 I,Jameka J Llittleton, CMA,acting as a Neurosurgeon for [employer], NP.,have documented all relevant documentation on the behalf of Bruna Creighton, NP,as directed by  Bruna Creighton, NP while in the presence of Bruna Creighton, NP.  Subjective:  Patient ID: [name] , female    DOB: Feb 27, 1933 , 89 y.o.   MRN: [id number]  Chief Complaint  Patient presents with   Shortness of Breath    HPI  Patient is a 89 year old female who presents today with complaints of shortness of breath. She reports that she is normally able to go up the stairs easily but reports that noticed that gradually that has become very difficult and especially since  yesterday, whenever she tries to walk up the stairs, she has to catch her breath. Discussing with patient and going through her medication list, patient realized that she has been taking double Famotidine  and not taking her Furosemide  20 mg every day. So patient admits that she has missed her Furosemide  20 mg for 2 weeks. Patient was advised to get back on Furosemide  20 mg ASAP when she gets home. Patient was accompanied to clinic  by her daughter who will be helping her fix her meds for the week stated the patient.        Past Medical History:  Diagnosis Date   Anxiety    Aortic stenosis    Echo 07/05/16: Very mild AS, Mean gradient 10 mm Hg, Peak gradient (S) 19 mmHg    Arthritis    left hip and back   Bronchiectasis    isolated to RML; status post right middle lobe partial resection.   CHF (congestive heart failure) (HCC)    Chronic kidney disease    Complication of anesthesia    hard to wake up   Dyspnea    Gall stones    GERD (gastroesophageal reflux disease)    history    H/O osteoporosis    Heart murmur    hasn't been heard lately   Hypertension    Hyperthyroidism    endocrinologist - Dr. Tommas   Pneumonia    Yeast infection      Family History  Problem Relation Age of Onset   Hypertension Mother    CVA Mother 59   Heart attack Sister     Diabetes Sister    Healthy Father    Heart attack Brother    Heart attack Brother      Current Outpatient Medications:    acetaminophen  (TYLENOL ) 650 MG CR tablet, Take 1,300 mg by mouth every 8 (eight) hours as needed for pain., Disp: , Rfl:    Ascorbic Acid (VITAMIN C) 500 MG CAPS, Take 500 mg by mouth daily., Disp: , Rfl:    aspirin  EC 81 MG tablet, Take 1 tablet (81 mg total) by mouth 2 (two) times daily., Disp: 60 tablet, Rfl: 0   busPIRone  (BUSPAR ) 5 MG tablet, TAKE 1 TABLET(5 MG) BY MOUTH DAILY AS NEEDED FOR ANXIETY, Disp: 90 tablet, Rfl: 1   calcium  carbonate (OS-CAL) 600 MG TABS tablet, Take 600 mg by mouth daily., Disp: , Rfl:    cholecalciferol  (VITAMIN D3) 25 MCG (1000 UT) tablet, Take 1,000 Units by mouth daily., Disp: , Rfl:    diclofenac  Sodium (VOLTAREN ) 1 % GEL, APPLY 2 GRAMS 4 TIMES DAILY AS DIRECTED, Disp: 500 g, Rfl: 1   methimazole  (TAPAZOLE ) 5 MG tablet, Take 2.5 mg by mouth daily. , Disp: , Rfl:    nystatin  cream (MYCOSTATIN ), Apply topically 2 (  two) times daily as needed for dry skin., Disp: 30 g, Rfl: 1   ondansetron  (ZOFRAN -ODT) 4 MG disintegrating tablet, Take 1 tablet (4 mg total) by mouth every 8 (eight) hours as needed., Disp: 20 tablet, Rfl: 0   famotidine  (PEPCID ) 20 MG tablet, TAKE 1 TABLET(20 MG) BY MOUTH DAILY (Patient not taking: Reported on 04/24/2024), Disp: 90 tablet, Rfl: 2   fenofibrate  (TRICOR ) 145 MG tablet, Take 1 tablet (145 mg total) by mouth daily., Disp: 90 tablet, Rfl: 3   furosemide  (LASIX ) 20 MG tablet, Take 1 tablet (20 mg total) by mouth daily., Disp: 90 tablet, Rfl: 3   metoprolol  succinate (TOPROL -XL) 25 MG 24 hr tablet, Take 1 tablet (25 mg total) by mouth daily., Disp: 90 tablet, Rfl: 3   oxybutynin (DITROPAN-XL) 10 MG 24 hr tablet, Take 10 mg by mouth daily. (Patient not taking: Reported on 04/24/2024), Disp: , Rfl:    oxyCODONE -acetaminophen  (PERCOCET/ROXICET) 5-325 MG tablet, Take 1 tablet by mouth every 6 (six) hours as needed for  severe pain (pain score 7-10). (Patient not taking: Reported on 04/24/2024), Disp: 15 tablet, Rfl: 0   potassium chloride  SA (KLOR-CON  M) 20 MEQ tablet, Take 1 tablet (20 mEq total) by mouth daily., Disp: 90 tablet, Rfl: 3   Allergies  Allergen Reactions   Ibuprofen Other (See Comments)    hallucinations Other reaction(s): hallucinations   Naproxen Sodium Other (See Comments)    hallucinations   Nitrofurantoin Rash   Oxybutynin Rash     Review of Systems  Constitutional: Negative.   HENT: Negative.    Respiratory:  Positive for shortness of breath.   Cardiovascular:  Positive for leg swelling. Negative for chest pain and palpitations.  Gastrointestinal: Negative.   Musculoskeletal: Negative.   Psychiatric/Behavioral: Negative.       Today's Vitals   04/16/24 1146 04/16/24 1234  BP: 130/78   Pulse: (!) 44 (!) 43  Temp: 98.2 F (36.8 C)   TempSrc: Oral   Weight: 204 lb (92.5 kg)   Height: 5' 1 (1.549 m)   PainSc: 0-No pain    Body mass index is 38.55 kg/m.  Wt Readings from Last 3 Encounters:  04/24/24 195 lb (88.5 kg)  04/16/24 204 lb (92.5 kg)  04/14/24 205 lb 3.2 oz (93.1 kg)    The ASCVD Risk score (Arnett DK, et al., 2019) failed to calculate for the following reasons:   The 2019 ASCVD risk score is only valid for ages 12 to 62  Objective:  Physical Exam HENT:     Head: Normocephalic.  Cardiovascular:     Rate and Rhythm: Bradycardia present.  Pulmonary:     Breath sounds: Normal breath sounds.  Musculoskeletal:     Left lower leg: Edema present.  Neurological:     Mental Status: She is alert and oriented to person, place, and time.         Assessment And Plan:  Bradycardia Assessment & Plan: Advised to hold metoprolol  for HR < 60 and notify cardiology if low HR episode.   Benign hypertensive heart and kidney disease with diastolic CHF, NYHA class II and CKD stage 2 (HCC) Assessment & Plan: Chronic. Followed by cardiology, no  changes to meds,  continue current treatment.     Return if symptoms worsen or fail to improve, for keep next appt.  Patient was given opportunity to ask questions. Patient verbalized understanding of the plan and was able to repeat key elements of the plan. All questions were answered to  their satisfaction.    I, Bruna Creighton, NP, have reviewed all documentation for this visit. The documentation on 04/26/2024 for the exam, diagnosis, procedures, and orders are all accurate and complete.     IF YOU HAVE BEEN REFERRED TO A SPECIALIST, IT MAY TAKE 1-2 WEEKS TO SCHEDULE/PROCESS THE REFERRAL. IF YOU HAVE NOT HEARD FROM US /SPECIALIST IN TWO WEEKS, PLEASE GIVE US  A CALL AT [phone].

## 2024-04-17 NOTE — Telephone Encounter (Signed)
 Spoke with patient and shared Dr. Genice response:  I have not seen this patient in over 3 years.  She would be a new patient of I saw her back.   Reasonable to hold Toprol  if heart rate less than 50.  If this is happening frequently, would simply reduce to 1/2 tablet daily.   She can follow-up with APP.   Alm Clay     Patient states she took 1/2 tablet of Toprol  last night and this morning HR was 57 upon waking.   Patient verbalized understanding of Dr. Genice recommendations and will keep appt on 7/18 with APP.

## 2024-04-21 NOTE — Progress Notes (Signed)
 [facility] Weekly Session  Patient Details  Name: [name] MRN: [id number] Date of Birth: 1933-04-17 Age: 89 y.o. PCP: Jarold Medici, MD  There were no vitals filed for this visit.   [facility] Weekly seesion - 04/21/24 1400       [facility] PRE[facility] Location   [facility] Location [employer] [facility]      Weekly Session   Topic Discussed Goal setting and welcome to the program          Suzen Ash 04/21/2024, 2:27 PM

## 2024-04-22 NOTE — Progress Notes (Unsigned)
 Cardiology Office Note   Date:  04/24/2024  ID:  Margy, Sumler 1932-11-16, MRN [id number] PCP: Jarold Medici, MD  [facility] Providers Cardiologist:  Alm Clay, MD {  History of Present Illness [name] is a 89 y.o. female with a past medical history significant for hypertension, OSA on CPAP, status post partial or empty L lobectomy for PNA, and diastolic dysfunction here for follow-up visit.  History includes initially being seen 3/15 for chest pain which was musculoskeletal in nature.  Echo and nuclear stress test in 2012 were negative for ischemia.  On 6/18 she was seen for preoperative valuation.  Her echo 9/17 showed normal EF and G2 DD with mild aortic stenosis and mildly elevated PAP.  She had planned for hip surgery.  However, she was not having significant symptoms.  She was felt to be low risk for her hip replacement surgery.  She is followed up by Lamarr Satterfield, DNP 10/19.  She was being evaluated for right hip surgery at that time.  Was deemed to be low risk.  She did not undergo her procedure.  She was seen by Dr. Burnard 3/20 for preoperative valuation and her pulse was noted to be in the 40s.  She was seen 2/21 by Lamarr Satterfield, DNP.  She reported having trouble with both knees.  She denied chest pain.  She noted dyspnea with climbing stairs.  She noted to have postnasal drip.  She was treated with OTC and antihistamine medications.  She underwent left total knee arthroplasty 5/21.  Was seen in follow-up by Dr. Clay 11/21/2020.  During that time remained stable from a cardiovascular standpoint.  Continues to have some pain and stiffness in her left knee.  She reported her hips were feeling somewhat better.  Had limited walking and was not doing regular exercise.  Gained about 5 pounds.  Was noted to have dyspnea with exertion which was felt to be related to deconditioning.  Only noted dyspnea with going up stairs.  Denied chest pain or pressure.  She  would have minimal rare palpitations.  She was seen May of last year for evaluation.  She reported that her knee replacement was having difficulty.  Continue to be very physically active at home doing all of her work around the house and also enjoyed doing light yard work.  Reviewed importance of staying active and a low-sodium heart healthy diet.  She denied chest pain, shortness of breath, lower extremity edema, fatigue, palpitations, melena, hematuria, hemoptysis, diaphoresis, weakness, presyncope/syncope, orthopnea and PND.  Today, she presents with  mild aortic stenosis and an increase in her shortness of breath.  She experiences chronic shortness of breath with minimal exertion, such as walking from the kitchen to the bedroom. This symptom was present before starting a new exercise program at the [facility]. She sometimes has a sensation of phlegm in her throat accompanying the shortness of breath.  She underwent a lobectomy for pneumonia years ago and does not take any medications specifically for lung issues. She is unsure if the lobectomy contributes to her breathing difficulties.  Her current medications include Lasix  (furosemide ) 20 mg daily.  Her weight has decreased from 198 pounds to 195 pounds.  She recalls a previous echocardiogram showing moderate calcification on the aortic valve with mild narrowing, but no severe stenosis. Her heart pump function was reported as normal with an ejection fraction of 60-65%. A past EKG showed sinus rhythm at 77 beats per minute, consistent with previous results.  Reports no chest pain, pressure, or tightness. No orthopnea, PND. Reports no palpitations.   Discussed the use of AI scribe software for clinical note transcription with the patient, who gave verbal consent to proceed.  ROS: pertinent ROS in HPI  Studies Reviewed      Echocardiogram 07/05/2016   Study Conclusions   - Left ventricle: The cavity size was normal. Systolic function was     normal. The estimated ejection fraction was in the range of 60%    to 65%. Wall motion was normal; there were no regional wall    motion abnormalities. Features are consistent with a pseudonormal    left ventricular filling pattern, with concomitant abnormal    relaxation and increased filling pressure (grade 2 diastolic    dysfunction).  - Aortic valve: There was very mild stenosis. Valve area (VTI):    2.01 cm^2. Valve area (Vmax): 2.09 cm^2. Valve area (Vmean): 1.96    cm^2.  - Mitral valve: Calcified annulus.  - Left atrium: The atrium was mildly dilated.  - Pulmonary arteries: Systolic pressure was mildly to moderately    increased. PA peak pressure: 46 mm Hg (S).   -------------------------------------------------------------------  Study data:  Comparison was made to the study of 07/08/2012.  Study  status:  Routine.  Procedure:  The patient reported no pain pre or  post test. Transthoracic echocardiography. Image quality was good.  Study completion:  There were no complications.  Transthoracic echocardiography.  M-mode, complete 2D, spectral  Doppler, and color Doppler.  Birthdate:  Patient birthdate:  01-08-1933.  Age:  Patient is 89 yr old.  Sex:  Gender: female.  BMI: 39.1 kg/m^2.  Blood pressure:     136/61  Patient status:  Inpatient.  Study date:  Study date: 07/05/2016. Study time: 10:06  AM.  Location:  Bedside.   -------------------------------------------------------------------   -------------------------------------------------------------------  Left ventricle:  The cavity size was normal. Systolic function was  normal. The estimated ejection fraction was in the range of 60% to  65%. Wall motion was normal; there were no regional wall motion  abnormalities. Features are consistent with a pseudonormal left  ventricular filling pattern, with concomitant abnormal relaxation  and increased filling pressure (grade 2 diastolic dysfunction).    -------------------------------------------------------------------  Aortic valve:   Trileaflet; mildly thickened, mildly calcified  leaflets. Mobility was not restricted.  Doppler:   There was very  mild stenosis.   There was no regurgitation.    VTI ratio of LVOT  to aortic valve: 0.7. Valve area (VTI): 2.01 cm^2. Indexed valve  area (VTI): 0.98 cm^2/m^2. Peak velocity ratio of LVOT to aortic  valve: 0.73. Valve area (Vmax): 2.09 cm^2. Indexed valve area  (Vmax): 1.01 cm^2/m^2. Mean velocity ratio of LVOT to aortic valve:  0.68. Valve area (Vmean): 1.96 cm^2. Indexed valve area (Vmean):  0.95 cm^2/m^2.    Mean gradient (S): 10 mm Hg. Peak gradient (S):  19 mm Hg.   -------------------------------------------------------------------  Aorta: Aortic root: The aortic root was normal in size.   -------------------------------------------------------------------  Mitral valve:   Calcified annulus. Mobility was not restricted.  Doppler:  Transvalvular velocity was within the normal range. There  was no evidence for stenosis. There was trivial regurgitation.  Peak gradient (D): 4 mm Hg.   -------------------------------------------------------------------  Left atrium:  The atrium was mildly dilated.   -------------------------------------------------------------------  Right ventricle:  The cavity size was normal. Wall thickness was  normal. Systolic function was normal.   -------------------------------------------------------------------  Pulmonic valve:  Structurally normal valve.   Cusp separation was  normal.  Doppler:  Transvalvular velocity was within the normal  range. There was no evidence for stenosis. There was trivial  regurgitation.   -------------------------------------------------------------------  Tricuspid valve:   Structurally normal valve.    Doppler:  Transvalvular velocity was within the normal range. There was mild  regurgitation.    -------------------------------------------------------------------  Pulmonary artery:   The main pulmonary artery was normal-sized.  Systolic pressure was mildly to moderately increased.   -------------------------------------------------------------------  Right atrium:  The atrium was normal in size.   -------------------------------------------------------------------  Pericardium: There was no pericardial effusion.   -------------------------------------------------------------------  Systemic veins:  Inferior vena cava: The vessel was dilated. The respirophasic  diameter changes were blunted (< 50%), consistent with elevated  central venous pressure.    Physical Exam VS:  BP 110/62 (BP Location: Left Arm, Patient Position: Sitting, Cuff Size: Large)   Pulse 65   Ht 5' 1 (1.549 m)   Wt 195 lb (88.5 kg)   SpO2 96%   BMI 36.84 kg/m        Wt Readings from Last 3 Encounters:  04/24/24 195 lb (88.5 kg)  04/16/24 204 lb (92.5 kg)  04/14/24 205 lb 3.2 oz (93.1 kg)    GEN: Well nourished, well developed in no acute distress NECK: No JVD; No carotid bruits CARDIAC: RRR, no murmurs, rubs, gallops RESPIRATORY:  Clear to auscultation without rales, wheezing or rhonchi  ABDOMEN: Soft, non-tender, non-distended EXTREMITIES:  No edema; No deformity   ASSESSMENT AND PLAN  Shortness of breath Chronic shortness of breath not worsened by exercise. Normal heart function, mild aortic stenosis unlikely cause. Possible contribution from lobectomy and diastolic dysfunction. - Monitor with exercise program. - Increase Lasix  to 30 mg if worsens. - Track daily weight for fluid status.  Diastolic dysfunction Previous echocardiogram indicated impaired relaxation, recent echocardiogram did not confirm. Possible contribution to symptoms, managed with current medications. - Continue metoprolol  and Lasix . - Monitor blood pressure, adjust Lasix  as needed.  Mild aortic stenosis Mild  aortic stenosis with mean gradient of 13, not causing significant symptoms. No immediate intervention needed.  Lobectomy Possible contribution to chronic shortness of breath, no acute issues.  Knee pain post-surgery Persistent pain post-revision surgery, exacerbated by weather. Functional improvement with therapy and exercise. - Continue physical therapy and exercise at [facility]. - Consider compression stockings if swelling after exercise.  Essential hypertension -well controlled -continue current medication regimen     Dispo: She can keep her regular appointment with Dr. Anner in a month  Signed, Orren LOISE Fabry, PA-C

## 2024-04-24 ENCOUNTER — Ambulatory Visit: Attending: Physician Assistant | Admitting: Physician Assistant

## 2024-04-24 ENCOUNTER — Encounter: Payer: Self-pay | Admitting: Physician Assistant

## 2024-04-24 VITALS — BP 110/62 | HR 65 | Ht 61.0 in | Wt 195.0 lb

## 2024-04-24 DIAGNOSIS — I350 Nonrheumatic aortic (valve) stenosis: Secondary | ICD-10-CM | POA: Diagnosis present

## 2024-04-24 DIAGNOSIS — R011 Cardiac murmur, unspecified: Secondary | ICD-10-CM | POA: Insufficient documentation

## 2024-04-24 DIAGNOSIS — I5032 Chronic diastolic (congestive) heart failure: Secondary | ICD-10-CM | POA: Insufficient documentation

## 2024-04-24 DIAGNOSIS — I10 Essential (primary) hypertension: Secondary | ICD-10-CM | POA: Insufficient documentation

## 2024-04-24 MED ORDER — FUROSEMIDE 20 MG PO TABS: 20.0000 mg | ORAL_TABLET | Freq: Every day | ORAL | 3 refills | Status: AC

## 2024-04-24 MED ORDER — METOPROLOL SUCCINATE ER 25 MG PO TB24: 25.0000 mg | ORAL_TABLET | Freq: Every day | ORAL | 3 refills | Status: AC

## 2024-04-24 MED ORDER — FENOFIBRATE 145 MG PO TABS: 145.0000 mg | ORAL_TABLET | Freq: Every day | ORAL | 3 refills | Status: AC

## 2024-04-24 MED ORDER — POTASSIUM CHLORIDE CRYS ER 20 MEQ PO TBCR: 20.0000 meq | EXTENDED_RELEASE_TABLET | Freq: Every day | ORAL | 3 refills | Status: AC

## 2024-04-24 NOTE — Patient Instructions (Addendum)
 Medication Instructions:  You may take an extra half of Lasix  if you're experiencing shortness of breathe.  Monitor your weight daily. If you have gained 2lbs in one night or 5lbs in 1 week, send a mychart message or call the office.   *If you need a refill on your cardiac medications before your next appointment, please call your pharmacy*   Lab Work: No labs were ordered during today's visit.  If you have labs (blood work) drawn today and your tests are completely normal, you will receive your results only by: MyChart Message (if you have MyChart) OR A paper copy in the mail If you have any lab test that is abnormal or we need to change your treatment, we will call you to review the results.   Testing/Procedures: No procedures were ordered during today's visit.    Follow-Up: At [facility], you and your health needs are our priority.  As part of our continuing mission to provide you with exceptional heart care, we have created designated Provider Care Teams.  These Care Teams include your primary Cardiologist (physician) and Advanced Practice Providers (APPs -  Physician Assistants and Nurse Practitioners) who all work together to provide you with the care you need, when you need it.  We recommend signing up for the patient portal called MyChart.  Sign up information is provided on this After Visit Summary.  MyChart is used to connect with patients for Virtual Visits (Telemedicine).  Patients are able to view lab/test results, encounter notes, upcoming appointments, etc.  Non-urgent messages can be sent to your provider as well.   To learn more about what you can do with MyChart, go to [email or web address].    Your next appointment:   1 month(s)  Provider:   Alm Clay, MD    WEIGHT READINGS    DATE TIME WT HR COMMENT DATE  TIME  WT HR COMMENT                                                                                                                                                                                                                                                                     DATE TIME WT HR COMMENT DATE  TIME  WT HR COMMENT

## 2024-04-26 DIAGNOSIS — R001 Bradycardia, unspecified: Secondary | ICD-10-CM | POA: Insufficient documentation

## 2024-04-26 NOTE — Assessment & Plan Note (Signed)
 Advised to hold metoprolol  for HR < 60 and notify cardiology if low HR episode.

## 2024-04-27 DIAGNOSIS — R0602 Shortness of breath: Secondary | ICD-10-CM | POA: Insufficient documentation

## 2024-04-27 DIAGNOSIS — I130 Hypertensive heart and chronic kidney disease with heart failure and stage 1 through stage 4 chronic kidney disease, or unspecified chronic kidney disease: Secondary | ICD-10-CM | POA: Insufficient documentation

## 2024-04-27 NOTE — Assessment & Plan Note (Signed)
 Chronic. Followed by cardiology, no  changes to meds, continue current treatment.

## 2024-04-27 NOTE — Assessment & Plan Note (Signed)
 Patient missed her Furosemide  dose for 2 weeks, advised to get back on it.

## 2024-04-28 NOTE — Progress Notes (Signed)
 [facility] [facility] Weekly Session  Patient Details  Name: [name] MRN: [id number] Date of Birth: [birth year] Age: 89 y.o. PCP: Jarold Medici, MD  Vitals:   04/28/24 1420  Weight: 192 lb (87.1 kg)     [facility] Weekly seesion - 04/28/24 1400       [facility] PRE[facility] Location   [facility] Location [employer] [facility]      Weekly Session   Topic Discussed Importance of resistance training;Other ways to be active   Talked about cardio and strength, weekly logs   Classes attended to date 3          Suzen Ash 04/28/2024, 2:21 PM

## 2024-05-12 NOTE — Progress Notes (Signed)
 [facility] Weekly Session  Patient Details  Name: [name] MRN: [id number] Date of Birth: 1933-09-29 Age: 89 y.o. PCP: Jarold Medici, MD  Vitals:   05/12/24 1418  Weight: 214 lb (97.1 kg)     [facility] Weekly seesion - 05/12/24 1400       [facility] PREP Location   [facility] Location [employer] [facility]      Weekly Session   Topic Discussed Healthy eating tips   Fats, oils, sugars, sodium, whole grains. Introduced [employer] exercised this week 75 minutes    Classes attended to date 6          Suzen Ash 05/12/2024, 2:20 PM

## 2024-05-25 ENCOUNTER — Ambulatory Visit: Payer: Self-pay

## 2024-05-25 NOTE — Telephone Encounter (Signed)
 Covid information call  Reason for Disposition  COVID-19 Home Isolation, questions about  Answer Assessment - Initial Assessment Questions 1. COVID-19 DIAGNOSIS: How do you know that you have COVID? (e.g., positive lab test or self-test, diagnosed by doctor or NP/PA, symptoms after exposure).     Home test positive one week ago, states symptoms have resolved and requested recommendations for precautions to take around others. This RN advised that as long as her symptoms have resolved and she has been fever free x 24 hours without the use of tylenol  or ibuprofen, she can be around others but to perform regular handwashing and try to avoid those that are immunocompromised or wear a mask for an additional 5 days. Advised to call back if her symptoms return or to go to the ED if she develops shortness of breath or chest discomfort. Patient verbalized understanding.  Protocols used: Coronavirus (COVID-19) Diagnosed or Suspected-A-AH Message from [place] sent at 05/25/2024 10:26 AM EDT  Summary: cold and flu like symptoms   The patient shares that they have previously tested positive for COVID 19 via an at home test. The patient shares that they are now feeling much better, but would like to ensure that they are no longer contagious. Please contact the patient further when possible

## 2024-06-02 NOTE — Progress Notes (Signed)
 [facility] Weekly Session  Patient Details  Name: [name] MRN: [id number] Date of Birth: 24-Jun-1933 Age: 89 y.o. PCP: Jarold Medici, MD  Vitals:   06/02/24 1359  Weight: 196 lb 12.8 oz (89.3 kg)     [facility] Weekly seesion - 06/02/24 1400       [facility] PREP Location   [facility] PRE[facility] Location [facility]      Weekly Session   Topic Discussed Stress management and problem solving   Talked about ways to deal with stress and how to get better sleep. Went over yoga breath technique.   Classes attended to date 8          [facility] 06/02/2024, 2:00 PM

## 2024-06-03 ENCOUNTER — Ambulatory Visit: Admitting: Cardiology

## 2024-06-09 NOTE — Progress Notes (Signed)
 [facility] Weekly Session  Patient Details  Name: [name] MRN: [id number] Date of Birth: 08/05/1933 Age: 89 y.o. PCP: Jarold Medici, MD  Vitals:   06/09/24 1405  Weight: 212 lb (96.2 kg)     [facility] Weekly seesion - 06/09/24 1400       [facility] PREP Location   [facility] PREP Location [facility]      Weekly Session   Topic Discussed Expectations and non-scale victories   Talked about ways to not focus on the scale. Reviewed facts from previous classes.   Minutes exercised this week 20 minutes    Classes attended to date [address]          Suzen Ash 06/09/2024, 2:06 PM

## 2024-06-16 NOTE — Progress Notes (Signed)
 [facility] Weekly Session  Patient Details  Name: [name] MRN: [id number] Date of Birth: 03-31-1933 Age: 89 y.o. PCP: Jarold Medici, MD  Vitals:   06/16/24 1501  Weight: 210 lb (95.3 kg)     [facility] Weekly seesion - 06/16/24 1500       [facility] PREP Location   [facility] Location [facility]      Weekly Session   Topic Discussed Other   Portion size matters, went over [employer] and [employer] food plates, showed briefcase of examples of protion size.   Minutes exercised this week 25 minutes    Classes attended to date [address]          Suzen Ash 06/16/2024, 3:01 PM

## 2024-06-23 NOTE — Progress Notes (Signed)
 [facility] Weekly Session  Patient Details  Name: [name] MRN: [id number] Date of Birth: 1933/06/23 Age: 89 y.o. PCP: Jarold Medici, MD  Vitals:   06/23/24 1410  Weight: 204 lb (92.5 kg)     [facility] Weekly seesion - 06/23/24 1400       [facility] PREP Location   [facility] PREP Location [facility]      Weekly Session   Topic Discussed Calorie breakdown   Discussion on macronutrients and what carbs, fats, and proteins are, suppliments, and craisin demonstration   Minutes exercised this week 65 minutes    Classes attended to date [address] 06/23/2024, 2:11 PM

## 2024-07-07 NOTE — Progress Notes (Signed)
 [facility] Weekly Session  Patient Details  Name: [name] MRN: [id number] Date of Birth: 1933/05/09 Age: 89 y.o. PCP: Jarold Medici, MD  Vitals:   07/07/24 1409  Weight: 198 lb (89.8 kg)     [facility] Weekly seesion - 07/07/24 1400       [facility] PREP Location   [facility] PREP Location [facility]      Weekly Session   Topic Discussed Other   Fit Testing   Classes attended to date [address] 07/07/2024, 2:10 PM

## 2024-07-09 NOTE — Progress Notes (Signed)
 [facility] PRE[facility] Evaluation  Patient Details  Name: [name] MRN: [id number] Date of Birth: 10-16-1932 Age: 89 y.o. PCP: Jarold Medici, MD  Vitals:   07/09/24 1316  BP: 124/83  Pulse: 62  SpO2: 97%  Weight: 199 lb 12.8 oz (90.6 kg)     [facility] Eval - 07/09/24 1300       Referral    Program Start Date 04/14/24    Program End Date 07/22/24      Measurement   Waist Circumference 41 inches    Waist Circumference End Program 41 inches    Hip Circumference 44.5 inches    Hip Circumference End Program 45 inches    Body fat 42.4 percent      Mobility and Daily Activities   I find it easy to walk up or down two or more flights of stairs. 2    I have no trouble taking out the trash. 2    I do housework such as vacuuming and dusting on my own without difficulty. 3    I can easily lift a gallon of milk (8lbs). 4    I can easily walk a mile. 1    I have no trouble reaching into high cupboards or reaching down to pick up something from the floor. 4    I do not have trouble doing out-door work such as Loss adjuster, chartered, raking leaves, or gardening. 2      Mobility and Daily Activities   I feel younger than my age. 3    I feel independent. 4    I feel energetic. 3    I live an active life.  3    I feel strong. 3    I feel healthy. 3    I feel active as other people my age. 2      How fit and strong are you.   Fit and Strong Total Score 39         Past Medical History:  Diagnosis Date   Anxiety    Aortic stenosis    Echo 07/05/16: Very mild AS, Mean gradient 10 mm Hg, Peak gradient (S) 19 mmHg    Arthritis    left hip and back   Bronchiectasis    isolated to RML; status post right middle lobe partial resection.   CHF (congestive heart failure) (HCC)    Chronic kidney disease    Complication of anesthesia    hard to wake up   Dyspnea    Gall stones    GERD (gastroesophageal reflux disease)    history    H/O osteoporosis    Heart murmur    hasn't been heard lately    Hypertension    Hyperthyroidism    endocrinologist - Dr. Tommas   Pneumonia    Yeast infection    Past Surgical History:  Procedure Laterality Date   ABDOMINAL HYSTERECTOMY  1974   CHOLECYSTECTOMY N/A 07/02/2016   Procedure: LAPAROSCOPIC CHOLECYSTECTOMY;  Surgeon: Jina Nephew, MD;  [facility];  Service: General;  Laterality: N/A;   COLONOSCOPY     ENDOSCOPIC RETROGRADE CHOLANGIOPANCREATOGRAPHY (ERCP) WITH PROPOFOL  N/A 08/07/2018   Procedure: ENDOSCOPIC RETROGRADE CHOLANGIOPANCREATOGRAPHY (ERCP) WITH PROPOFOL ;  Surgeon: Rollin Dover, MD;  [facility]: [facility];  Service: Endoscopy;  Laterality: N/A;   EYE SURGERY     stye lanced   HARDWARE REMOVAL Left 02/24/2020   Procedure: Hardware Removal, left knee;  Surgeon: Barbarann Oneil BROCKS, MD;  Location: [facility] OR;  Service: Orthopedics;  Laterality:  Left;   HERNIA REPAIR  1975   KNEE SURGERY Left    LUNG REMOVAL, PARTIAL  11/2006   RML   NM MYOVIEW  LTD  06/2011   Normal EF. No ischemia or infarction.   SPHINCTEROTOMY  08/07/2018   Procedure: SPHINCTEROTOMY;  Surgeon: Rollin Dover, MD;  Location: [facility];  Service: Endoscopy;;  balloon sweep   STERIOD INJECTION Right 02/24/2020   Procedure: RIGHT KNEE STEROID INTRA-ARTICULAR MARCAINE /DEPO-MEDROL  INJECTION UNDER ANESTHESIA;  Surgeon: Barbarann Oneil BROCKS, MD;  Location: [facility];  Service: Orthopedics;  Laterality: Right;   TOTAL HIP ARTHROPLASTY Left 04/05/2017   Procedure: LEFT TOTAL HIP ARTHROPLASTY ANTERIOR APPROACH;  Surgeon: Barbarann Oneil BROCKS, MD;  [facility];  Service: Orthopedics;  Laterality: Left;   TOTAL HIP ARTHROPLASTY Right 12/19/2018   Procedure: RIGHT TOTAL HIP ARTHROPLASTY-DIRECT ANTERIOR APPROACH;  Surgeon: Barbarann Oneil BROCKS, MD;  [facility];  Service: Orthopedics;  Laterality: Right;   TOTAL KNEE ARTHROPLASTY Left 02/24/2020   Procedure: LEFT TOTAL KNEE ARTHROPLASTY;  Surgeon: Barbarann Oneil BROCKS, MD;  [facility];  Service: Orthopedics;  Laterality: Left;   TOTAL KNEE REVISION  Left 03/18/2023   Procedure: LEFT TOTAL KNEE REVISION;  Surgeon: Liam Lerner, MD;  [facility];  Service: Orthopedics;  Laterality: Left;   TRANSTHORACIC ECHOCARDIOGRAM  06/2011   Mild concentric LVH. Normal EF with impaired relaxation. Mildly elevated RV pressures of 30 and 40 mmHg.   TRANSTHORACIC ECHOCARDIOGRAM  06/2016   normal LV size and function. EF 60-65% with GRD 2 DD. Mild aortic stenosis. Mild LA dilation. Mild to moderately increased PA pressures (46 mmHg).   TUBAL LIGATION     Social History   Tobacco Use  Smoking Status Never  Smokeless Tobacco Never  [name] completed the PREP Program on July 09, 2024 Classes attended: Education: 8 Strength: 9 [name] lost 5.4 lbs Her arm curls went from 15 to 19 Her balance improved   [name] 07/09/2024, 1:18 PM

## 2024-07-14 ENCOUNTER — Ambulatory Visit: Admitting: Internal Medicine

## 2024-07-16 ENCOUNTER — Other Ambulatory Visit: Payer: Self-pay | Admitting: Internal Medicine

## 2024-09-16 ENCOUNTER — Ambulatory Visit: Payer: Self-pay | Admitting: Internal Medicine

## 2024-09-16 ENCOUNTER — Other Ambulatory Visit: Payer: Self-pay | Admitting: Internal Medicine

## 2024-09-16 ENCOUNTER — Encounter: Payer: Self-pay | Admitting: Internal Medicine

## 2024-09-16 ENCOUNTER — Ambulatory Visit: Payer: Medicare Other

## 2024-09-16 VITALS — BP 126/64 | HR 65 | Temp 97.8°F | Ht 61.0 in | Wt 199.0 lb

## 2024-09-16 VITALS — BP 126/64 | HR 65 | Temp 97.8°F | Ht 61.0 in | Wt 199.8 lb

## 2024-09-16 DIAGNOSIS — I130 Hypertensive heart and chronic kidney disease with heart failure and stage 1 through stage 4 chronic kidney disease, or unspecified chronic kidney disease: Secondary | ICD-10-CM | POA: Diagnosis not present

## 2024-09-16 DIAGNOSIS — E7800 Pure hypercholesterolemia, unspecified: Secondary | ICD-10-CM

## 2024-09-16 DIAGNOSIS — K219 Gastro-esophageal reflux disease without esophagitis: Secondary | ICD-10-CM

## 2024-09-16 DIAGNOSIS — I5030 Unspecified diastolic (congestive) heart failure: Secondary | ICD-10-CM

## 2024-09-16 DIAGNOSIS — Z0000 Encounter for general adult medical examination without abnormal findings: Secondary | ICD-10-CM | POA: Diagnosis not present

## 2024-09-16 DIAGNOSIS — N182 Chronic kidney disease, stage 2 (mild): Secondary | ICD-10-CM

## 2024-09-16 DIAGNOSIS — I700 Atherosclerosis of aorta: Secondary | ICD-10-CM

## 2024-09-16 LAB — HEMOGLOBIN A1C
Est. average glucose Bld gHb Est-mCnc: 114 mg/dL
Hgb A1c MFr Bld: 5.6 % (ref 4.8–5.6)

## 2024-09-16 LAB — LIPID PANEL
Chol/HDL Ratio: 3 ratio (ref 0.0–4.4)
Cholesterol, Total: 145 mg/dL (ref 100–199)
HDL: 48 mg/dL (ref >39)
LDL Chol Calc (NIH): 83 mg/dL (ref 0–99)
Triglycerides: 69 mg/dL (ref 0–149)
VLDL Cholesterol Cal: 14 mg/dL (ref 5–40)

## 2024-09-16 LAB — CMP14+EGFR
ALT: 9 IU/L (ref 0–32)
AST: 17 IU/L (ref 0–40)
Albumin: 3.9 g/dL (ref 3.7–4.7)
Alkaline Phosphatase: 52 IU/L (ref 48–129)
BUN/Creatinine Ratio: 22 (ref 12–28)
BUN: 18 mg/dL (ref 8–27)
Bilirubin Total: 0.4 mg/dL (ref 0.0–1.2)
CO2: 24 mmol/L (ref 20–29)
Calcium: 9.8 mg/dL (ref 8.7–10.3)
Chloride: 106 mmol/L (ref 96–106)
Creatinine, Ser: 0.82 mg/dL (ref 0.57–1.00)
Globulin, Total: 3.3 g/dL (ref 1.5–4.5)
Glucose: 91 mg/dL (ref 70–99)
Potassium: 4.4 mmol/L (ref 3.5–5.2)
Sodium: 141 mmol/L (ref 134–144)
Total Protein: 7.2 g/dL (ref 6.0–8.5)
eGFR: 68 mL/min/1.73 (ref >59)

## 2024-09-16 MED ORDER — PANTOPRAZOLE SODIUM 20 MG PO TBEC: 20.0000 mg | DELAYED_RELEASE_TABLET | Freq: Every day | ORAL | 1 refills | Status: DC

## 2024-09-16 NOTE — Progress Notes (Signed)
 Chief Complaint  Patient presents with   Medicare Wellness     Subjective:   [name] is a 89 y.o. female who presents for a Medicare Annual Wellness Visit.  Visit info / Clinical Intake: Medicare Wellness Visit Type:: Subsequent Annual Wellness Visit Persons participating in visit and providing information:: patient & caregiver Medicare Wellness Visit Mode:: In-person (required for WTM) Interpreter Needed?: No Pre-visit prep was completed: yes AWV questionnaire completed by patient prior to visit?: no Living arrangements:: with family/others Patient's Overall Health Status Rating: very good Typical amount of pain: some Does pain affect daily life?: no Are you currently prescribed opioids?: no  Dietary Habits and Nutritional Risks How many meals a day?: 2 Eats fruit and vegetables daily?: (!) no (often , but not daily) Most meals are obtained by: preparing own meals In the last 2 weeks, have you had any of the following?: none Diabetic:: no  Functional Status Activities of Daily Living (to include ambulation/medication): Independent Ambulation: Independent with device- listed below Home Assistive Devices/Equipment: Cane Medication Administration: Needs assistance (comment) (daughter sets up) Home Management (perform basic housework or laundry): Independent Manage your own finances?: yes Primary transportation is: driving Concerns about vision?: no *vision screening is required for WTM* Concerns about hearing?: no  Fall Screening Falls in the past year?: 0 Number of falls in past year: 0 Was there an injury with Fall?: 0 Fall Risk Category Calculator: 0 Patient Fall Risk Level: Low Fall Risk  Fall Risk Patient at Risk for Falls Due to: Medication side effect Fall risk Follow up: Falls prevention discussed; Falls evaluation completed  Home and Transportation Safety: All rugs have non-skid backing?: N/A, no rugs All stairs or steps have railings?: yes (has a  chair lift) Grab bars in the bathtub or shower?: yes Have non-skid surface in bathtub or shower?: yes Good home lighting?: yes Regular seat belt use?: yes Hospital stays in the last year:: no  Cognitive Assessment Difficulty concentrating, remembering, or making decisions? : no Will 6CIT or Mini Cog be Completed: yes What year is it?: 0 points What month is it?: 0 points Give patient an address phrase to remember (5 components): [address] About what time is it?: 0 points Count backwards from 20 to 1: 0 points Say the months of the year in reverse: 0 points Repeat the address phrase from earlier: 8 points 6 CIT Score: 8 points  Advance Directives (For Healthcare) Does Patient Have a Medical Advance Directive?: Yes Does patient want to make changes to medical advance directive?: No - Patient declined Type of Advance Directive: Healthcare Power of [place]; Living will Copy of Healthcare Power of Attorney in Chart?: No - copy requested Copy of Living Will in Chart?: No - copy requested  Reviewed/Updated  Reviewed/Updated: Reviewed All (Medical, Surgical, Family, Medications, Allergies, Care Teams, Patient Goals)    Allergies (verified) Ibuprofen, Naproxen sodium, Nitrofurantoin, and Oxybutynin   Current Medications (verified) Outpatient Encounter Medications as of 09/16/2024  Medication Sig   acetaminophen  (TYLENOL ) 650 MG CR tablet Take 1,300 mg by mouth every 8 (eight) hours as needed for pain.   Ascorbic Acid (VITAMIN C) 500 MG CAPS Take 500 mg by mouth daily.   aspirin  EC 81 MG tablet Take 1 tablet (81 mg total) by mouth 2 (two) times daily.   busPIRone  (BUSPAR ) 5 MG tablet TAKE 1 TABLET(5 MG) BY MOUTH DAILY AS NEEDED FOR ANXIETY   calcium  carbonate (OS-CAL) 600 MG TABS tablet Take 600 mg  by mouth daily.   cholecalciferol  (VITAMIN D3) 25 MCG (1000 UT) tablet Take 1,000 Units by mouth daily.   diclofenac  Sodium (VOLTAREN ) 1 % GEL APPLY 2 GRAMS 4 TIMES DAILY AS  DIRECTED   famotidine  (PEPCID ) 20 MG tablet TAKE 1 TABLET(20 MG) BY MOUTH DAILY   fenofibrate  (TRICOR ) 145 MG tablet Take 1 tablet (145 mg total) by mouth daily.   furosemide  (LASIX ) 20 MG tablet Take 1 tablet (20 mg total) by mouth daily.   methimazole  (TAPAZOLE ) 5 MG tablet Take 2.5 mg by mouth daily.    metoprolol  succinate (TOPROL -XL) 25 MG 24 hr tablet Take 1 tablet (25 mg total) by mouth daily.   nystatin  cream (MYCOSTATIN ) Apply topically 2 (two) times daily as needed for dry skin.   ondansetron  (ZOFRAN -ODT) 4 MG disintegrating tablet Take 1 tablet (4 mg total) by mouth every 8 (eight) hours as needed.   potassium chloride  SA (KLOR-CON  M) 20 MEQ tablet Take 1 tablet (20 mEq total) by mouth daily.   oxybutynin (DITROPAN-XL) 10 MG 24 hr tablet Take 10 mg by mouth daily. (Patient not taking: Reported on 09/16/2024)   oxyCODONE -acetaminophen  (PERCOCET/ROXICET) 5-325 MG tablet Take 1 tablet by mouth every 6 (six) hours as needed for severe pain (pain score 7-10). (Patient not taking: Reported on 09/16/2024)   No facility-administered encounter medications on file as of 09/16/2024.    History: Past Medical History:  Diagnosis Date   Anxiety    Aortic stenosis    Echo 07/05/16: Very mild AS, Mean gradient 10 mm Hg, Peak gradient (S) 19 mmHg    Arthritis    left hip and back   Bronchiectasis    isolated to RML; status post right middle lobe partial resection.   CHF (congestive heart failure) (HCC)    Chronic kidney disease    Complication of anesthesia    hard to wake up   Dyspnea    Gall stones    GERD (gastroesophageal reflux disease)    history    H/O osteoporosis    Heart murmur    hasn't been heard lately   Hypertension    Hyperthyroidism    endocrinologist - Dr. Tommas   Pneumonia    Yeast infection    Past Surgical History:  Procedure Laterality Date   ABDOMINAL HYSTERECTOMY  1974   CHOLECYSTECTOMY N/A 07/02/2016   Procedure: LAPAROSCOPIC CHOLECYSTECTOMY;  Surgeon:  Jina Nephew, MD;  [facility];  Service: General;  Laterality: N/A;   COLONOSCOPY     ENDOSCOPIC RETROGRADE CHOLANGIOPANCREATOGRAPHY (ERCP) WITH PROPOFOL  N/A 08/07/2018   Procedure: ENDOSCOPIC RETROGRADE CHOLANGIOPANCREATOGRAPHY (ERCP) WITH PROPOFOL ;  Surgeon: Rollin Dover, MD;  [facility];  Service: Endoscopy;  Laterality: N/A;   EYE SURGERY     stye lanced   HARDWARE REMOVAL Left 02/24/2020   Procedure: Hardware Removal, left knee;  Surgeon: Barbarann Oneil BROCKS, MD;  Location: [facility];  Service: Orthopedics;  Laterality: Left;   HERNIA REPAIR  1975   KNEE SURGERY Left    LUNG REMOVAL, PARTIAL  11/2006   RML   NM MYOVIEW  LTD  06/2011   Normal EF. No ischemia or infarction.   SPHINCTEROTOMY  08/07/2018   Procedure: SPHINCTEROTOMY;  Surgeon: Rollin Dover, MD;  Location: [facility];  Service: Endoscopy;;  balloon sweep   STERIOD INJECTION Right 02/24/2020   Procedure: RIGHT KNEE STEROID INTRA-ARTICULAR MARCAINE /DEPO-MEDROL  INJECTION UNDER ANESTHESIA;  Surgeon: Barbarann Oneil BROCKS, MD;  [facility]: [facility];  Service: Orthopedics;  Laterality: Right;   TOTAL HIP ARTHROPLASTY  Left 04/05/2017   Procedure: LEFT TOTAL HIP ARTHROPLASTY ANTERIOR APPROACH;  Surgeon: Barbarann Oneil BROCKS, MD;  [facility];  Service: Orthopedics;  Laterality: Left;   TOTAL HIP ARTHROPLASTY Right 12/19/2018   Procedure: RIGHT TOTAL HIP ARTHROPLASTY-DIRECT ANTERIOR APPROACH;  Surgeon: Barbarann Oneil BROCKS, MD;  [facility];  Service: Orthopedics;  Laterality: Right;   TOTAL KNEE ARTHROPLASTY Left 02/24/2020   Procedure: LEFT TOTAL KNEE ARTHROPLASTY;  Surgeon: Barbarann Oneil BROCKS, MD;  [facility]: [facility];  Service: Orthopedics;  Laterality: Left;   TOTAL KNEE REVISION Left 03/18/2023   Procedure: LEFT TOTAL KNEE REVISION;  Surgeon: Liam Lerner, MD;  [facility]: [facility];  Service: Orthopedics;  Laterality: Left;   TRANSTHORACIC ECHOCARDIOGRAM  06/2011   Mild concentric LVH. Normal EF with impaired relaxation. Mildly elevated RV pressures  of 30 and 40 mmHg.   TRANSTHORACIC ECHOCARDIOGRAM  06/2016   normal LV size and function. EF 60-65% with GRD 2 DD. Mild aortic stenosis. Mild LA dilation. Mild to moderately increased PA pressures (46 mmHg).   TUBAL LIGATION     Family History  Problem Relation Age of Onset   Hypertension Mother    CVA Mother 40   Heart attack Sister    Diabetes Sister    Healthy Father    Heart attack Brother    Heart attack Brother    Social History   Occupational History   Occupation: retired  Tobacco Use   Smoking status: Never   Smokeless tobacco: Never  Vaping Use   Vaping status: Never Used  Substance and Sexual Activity   Alcohol use: No   Drug use: No   Sexual activity: Not Currently   Tobacco Counseling Counseling given: Not Answered  SDOH Screenings   Food Insecurity: No Food Insecurity (09/16/2024)  Housing: Unknown (09/16/2024)  Transportation Needs: No Transportation Needs (09/16/2024)  Utilities: Not At Risk (09/16/2024)  Alcohol Screen: Low Risk  (09/16/2024)  Depression (PHQ2-9): Low Risk  (09/16/2024)  Financial Resource Strain: Low Risk  (09/16/2024)  Physical Activity: Insufficiently Active (09/16/2024)  Social Connections: Moderately Integrated (09/16/2024)  Stress: No Stress Concern Present (09/16/2024)  Tobacco Use: Low Risk  (09/16/2024)  Health Literacy: Adequate Health Literacy (08/28/2023)   See flowsheets for full screening details  Depression Screen PHQ 2 & 9 Depression Scale- Over the past 2 weeks, how often have you been bothered by any of the following problems? Little interest or pleasure in doing things: 0 Feeling down, depressed, or hopeless (PHQ Adolescent also includes...irritable): 0 PHQ-2 Total Score: 0 Trouble falling or staying asleep, or sleeping too much: 0 Feeling tired or having little energy: 0 Poor appetite or overeating (PHQ Adolescent also includes...weight loss): 0 Feeling bad about yourself - or that you are a failure or have  let yourself or your family down: 0 Trouble concentrating on things, such as reading the newspaper or watching television (PHQ Adolescent also includes...like school work): 0 Moving or speaking so slowly that other people could have noticed. Or the opposite - being so fidgety or restless that you have been moving around a lot more than usual: 0 Thoughts that you would be better off dead, or of hurting yourself in some way: 0 PHQ-9 Total Score: 0 If you checked off any problems, how difficult have these problems made it for you to do your work, take care of things at home, or get along with other people?: Not difficult at all  Depression Treatment Depression Interventions/Treatment : EYV7-0 Score <4 Follow-up Not Indicated  Goals Addressed             This Visit's Progress    Patient Stated       09/16/2024, wants to lose weight             Objective:    Today's Vitals   09/16/24 0909  BP: 126/64  Pulse: 65  Temp: 97.8 F (36.6 C)  TempSrc: Oral  SpO2: 94%  Weight: 199 lb 12.8 oz (90.6 kg)  Height: 5' 1 (1.549 m)   Body mass index is 37.75 kg/m.  Hearing/Vision screen Hearing Screening - Comments:: Denies hearing issues Vision Screening - Comments:: Regular eye exams Immunizations and Health Maintenance Health Maintenance  Topic Date Due   Mammogram  01/25/2024   Influenza Vaccine  Never done   COVID-19 Vaccine (4 - 2025-26 season) 06/08/2024   Pneumococcal Vaccine: 50+ Years (1 of 2 - PCV) 03/04/2025 (Originally 05/24/1952)   Medicare Annual Wellness (AWV)  09/16/2025   DTaP/Tdap/Td (2 - Td or Tdap) 01/05/2032   Bone Density Scan  Completed   Zoster Vaccines- Shingrix  Completed   Meningococcal B Vaccine  Aged Out        Assessment/Plan:  This is a routine wellness examination for Thanh.  Patient Care Team: Jarold Medici, MD as PCP - General (Internal Medicine) Anner Alm ORN, MD as PCP - Cardiology (Cardiology)  I have personally reviewed  and noted the following in the patients chart:   Medical and social history Use of alcohol, tobacco or illicit drugs  Current medications and supplements including opioid prescriptions. Functional ability and status Nutritional status Physical activity Advanced directives List of other physicians Hospitalizations, surgeries, and ER visits in previous 12 months Vitals Screenings to include cognitive, depression, and falls Referrals and appointments  No orders of the defined types were placed in this encounter.  In addition, I have reviewed and discussed with patient certain preventive protocols, quality metrics, and best practice recommendations. A written personalized care plan for preventive services as well as general preventive health recommendations were provided to patient.   Ardella FORBES Dawn, LPN   [id number]/89/7974   Return in 1 year (on 09/16/2025).  After Visit Summary: (In Person-Printed) AVS printed and given to the patient  Nurse Notes: Vaccines not given: flu and covid declined today. States she will get mammogram in 2026.

## 2024-09-16 NOTE — Patient Instructions (Signed)
 [name],  Thank you for taking the time for your Medicare Wellness Visit. I appreciate your continued commitment to your health goals. Please review the care plan we discussed, and feel free to reach out if I can assist you further.  Please note that Annual Wellness Visits do not include a physical exam. Some assessments may be limited, especially if the visit was conducted virtually. If needed, we may recommend an in-person follow-up with your provider.  Ongoing Care Seeing your primary care provider every 3 to 6 months helps us  monitor your health and provide consistent, personalized care.   Referrals If a referral was made during today's visit and you haven't received any updates within two weeks, please contact the referred provider directly to check on the status.  Recommended Screenings:  Health Maintenance  Topic Date Due   Breast Cancer Screening  01/25/2024   Flu Shot  Never done   COVID-19 Vaccine (4 - 2025-26 season) 06/08/2024   Medicare Annual Wellness Visit  08/27/2024   Pneumococcal Vaccine for age over 51 (1 of 2 - PCV) 03/04/2025*   DTaP/Tdap/Td vaccine (2 - Td or Tdap) 01/05/2032   Osteoporosis screening with Bone Density Scan  Completed   Zoster (Shingles) Vaccine  Completed   Meningitis B Vaccine  Aged Out  *Topic was postponed. The date shown is not the original due date.       09/16/2024    9:14 AM  Advanced Directives  Does Patient Have a Medical Advance Directive? Yes  Type of Estate Agent of [place];Living will  Does patient want to make changes to medical advance directive? No - Patient declined  Copy of Healthcare Power of Attorney in Chart? No - copy requested    Vision: Annual vision screenings are recommended for early detection of glaucoma, cataracts, and diabetic retinopathy. These exams can also reveal signs of chronic conditions such as diabetes and high blood pressure.  Dental: Annual dental screenings help detect  early signs of oral cancer, gum disease, and other conditions linked to overall health, including heart disease and diabetes.  Please see the attached documents for additional preventive care recommendations.

## 2024-09-16 NOTE — Progress Notes (Unsigned)
 I,Victoria T Emmitt, CMA,acting as a neurosurgeon for Catheryn LOISE Slocumb, MD.,have documented all relevant documentation on the behalf of Catheryn LOISE Slocumb, MD,as directed by  Catheryn LOISE Slocumb, MD while in the presence of Catheryn LOISE Slocumb, MD.  Subjective:  Patient ID: [name] , female    DOB: 1933-05-22 , 89 y.o.   MRN: [id number]  Chief Complaint  Patient presents with   Hypertension    Patient presents today for bpc. She is accompanied by her daughter. Denies headache, chest pain & sob. AWV completed with [facility] advisor: Jiles.    HPI Discussed the use of AI scribe software for clinical note transcription with the patient, who gave verbal consent to proceed.  History of Present Illness    Hypertension This is a chronic problem. The current episode started more than 1 year ago. The problem has been gradually improving since onset. The problem is controlled. Pertinent negatives include no blurred vision. Risk factors for coronary artery disease include dyslipidemia, obesity and post-menopausal state. Past treatments include beta blockers. The current treatment provides moderate improvement. Compliance problems include exercise.      Past Medical History:  Diagnosis Date   Anxiety    Aortic stenosis    Echo 07/05/16: Very mild AS, Mean gradient 10 mm Hg, Peak gradient (S) 19 mmHg    Arthritis    left hip and back   Bronchiectasis    isolated to RML; status post right middle lobe partial resection.   CHF (congestive heart failure) (HCC)    Chronic kidney disease    Complication of anesthesia    hard to wake up   Dyspnea    Gall stones    GERD (gastroesophageal reflux disease)    history    H/O osteoporosis    Heart murmur    hasn't been heard lately   Hypertension    Hyperthyroidism    endocrinologist - Dr. Tommas   Pneumonia    Yeast infection      Family History  Problem Relation Age of Onset   Hypertension Mother    CVA Mother 69   Heart attack Sister     Diabetes Sister    Healthy Father    Heart attack Brother    Heart attack Brother      Current Outpatient Medications:    acetaminophen  (TYLENOL ) 650 MG CR tablet, Take 1,300 mg by mouth every 8 (eight) hours as needed for pain., Disp: , Rfl:    Ascorbic Acid (VITAMIN C) 500 MG CAPS, Take 500 mg by mouth daily., Disp: , Rfl:    aspirin  EC 81 MG tablet, Take 1 tablet (81 mg total) by mouth 2 (two) times daily., Disp: 60 tablet, Rfl: 0   busPIRone  (BUSPAR ) 5 MG tablet, TAKE 1 TABLET(5 MG) BY MOUTH DAILY AS NEEDED FOR ANXIETY, Disp: 90 tablet, Rfl: 1   calcium  carbonate (OS-CAL) 600 MG TABS tablet, Take 600 mg by mouth daily., Disp: , Rfl:    cholecalciferol  (VITAMIN D3) 25 MCG (1000 UT) tablet, Take 1,000 Units by mouth daily., Disp: , Rfl:    diclofenac  Sodium (VOLTAREN ) 1 % GEL, APPLY 2 GRAMS 4 TIMES DAILY AS DIRECTED, Disp: 500 g, Rfl: 1   famotidine  (PEPCID ) 20 MG tablet, TAKE 1 TABLET(20 MG) BY MOUTH DAILY, Disp: 90 tablet, Rfl: 2   fenofibrate  (TRICOR ) 145 MG tablet, Take 1 tablet (145 mg total) by mouth daily., Disp: 90 tablet, Rfl: 3   furosemide  (LASIX ) 20 MG tablet, Take 1 tablet (20  mg total) by mouth daily., Disp: 90 tablet, Rfl: 3   methimazole  (TAPAZOLE ) 5 MG tablet, Take 2.5 mg by mouth daily. , Disp: , Rfl:    metoprolol  succinate (TOPROL -XL) 25 MG 24 hr tablet, Take 1 tablet (25 mg total) by mouth daily., Disp: 90 tablet, Rfl: 3   nystatin  cream (MYCOSTATIN ), Apply topically 2 (two) times daily as needed for dry skin., Disp: 30 g, Rfl: 1   ondansetron  (ZOFRAN -ODT) 4 MG disintegrating tablet, Take 1 tablet (4 mg total) by mouth every 8 (eight) hours as needed., Disp: 20 tablet, Rfl: 0   oxybutynin (DITROPAN-XL) 10 MG 24 hr tablet, Take 10 mg by mouth daily. (Patient not taking: Reported on 09/16/2024), Disp: , Rfl:    oxyCODONE -acetaminophen  (PERCOCET/ROXICET) 5-325 MG tablet, Take 1 tablet by mouth every 6 (six) hours as needed for severe pain (pain score  7-10). (Patient not taking: Reported on 09/16/2024), Disp: 15 tablet, Rfl: 0   potassium chloride  SA (KLOR-CON  M) 20 MEQ tablet, Take 1 tablet (20 mEq total) by mouth daily., Disp: 90 tablet, Rfl: 3   Allergies  Allergen Reactions   Ibuprofen Other (See Comments)    hallucinations Other reaction(s): hallucinations   Naproxen Sodium Other (See Comments)    hallucinations   Nitrofurantoin Rash   Oxybutynin Rash     Review of Systems  Constitutional: Negative.   Eyes:  Negative for blurred vision.  Respiratory: Negative.    Cardiovascular: Negative.   Neurological: Negative.   Psychiatric/Behavioral: Negative.       Today's Vitals   09/16/24 0915  BP: 126/64  Pulse: 65  Temp: 97.8 F (36.6 C)  SpO2: 98%  Weight: 199 lb (90.3 kg)  Height: 5' 1 (1.549 m)   Body mass index is 37.6 kg/m.  Wt Readings from Last 3 Encounters:  09/16/24 199 lb (90.3 kg)  09/16/24 199 lb 12.8 oz (90.6 kg)  07/09/24 199 lb 12.8 oz (90.6 kg)    The ASCVD Risk score (Arnett DK, et al., 2019) failed to calculate for the following reasons:   The 2019 ASCVD risk score is only valid for ages 56 to 42  Objective:  Physical Exam      Assessment And Plan:   Assessment & Plan Benign hypertensive heart and kidney disease with diastolic CHF, NYHA class II and CKD stage 2 (HCC)  Atherosclerosis of aorta  Chronic renal disease, stage II   Assessment & Plan    No orders of the defined types were placed in this encounter.    Return for 6 month bpc 1 year ov w thn & ov w rs.  Patient was given opportunity to ask questions. Patient verbalized understanding of the plan and was able to repeat key elements of the plan. All questions were answered to their satisfaction.    I, Catheryn LOISE Slocumb, MD, have reviewed all documentation for this visit. The documentation on 09/16/24 for the exam, diagnosis, procedures, and orders are all accurate and complete.   IF YOU HAVE BEEN REFERRED TO A  SPECIALIST, IT MAY TAKE 1-2 WEEKS TO SCHEDULE/PROCESS THE REFERRAL. IF YOU HAVE NOT HEARD FROM US /SPECIALIST IN TWO WEEKS, PLEASE GIVE US  A CALL AT [phone].

## 2024-09-16 NOTE — Patient Instructions (Signed)
 Hypertension, Adult Hypertension is another name for high blood pressure. High blood pressure forces your heart to work harder to pump blood. This can cause problems over time. There are two numbers in a blood pressure reading. There is a top number (systolic) over a bottom number (diastolic). It is best to have a blood pressure that is below 120/80. What are the causes? The cause of this condition is not known. Some other conditions can lead to high blood pressure. What increases the risk? Some lifestyle factors can make you more likely to develop high blood pressure: Smoking. Not getting enough exercise or physical activity. Being overweight. Having too much fat, sugar, calories, or salt (sodium) in your diet. Drinking too much alcohol. Other risk factors include: Having any of these conditions: Heart disease. Diabetes. High cholesterol. Kidney disease. Obstructive sleep apnea. Having a family history of high blood pressure and high cholesterol. Age. The risk increases with age. Stress. What are the signs or symptoms? High blood pressure may not cause symptoms. Very high blood pressure (hypertensive crisis) may cause: Headache. Fast or uneven heartbeats (palpitations). Shortness of breath. Nosebleed. Vomiting or feeling like you may vomit (nauseous). Changes in how you see. Very bad chest pain. Feeling dizzy. Seizures. How is this treated? This condition is treated by making healthy lifestyle changes, such as: Eating healthy foods. Exercising more. Drinking less alcohol. Your doctor may prescribe medicine if lifestyle changes do not help enough and if: Your top number is above 130. Your bottom number is above 80. Your personal target blood pressure may vary. Follow these instructions at home: Eating and drinking  If told, follow the DASH eating plan. To follow this plan: Fill one half of your plate at each meal with fruits and vegetables. Fill one fourth of your plate  at each meal with whole grains. Whole grains include whole-wheat pasta, brown rice, and whole-grain bread. Eat or drink low-fat dairy products, such as skim milk or low-fat yogurt. Fill one fourth of your plate at each meal with low-fat (lean) proteins. Low-fat proteins include fish, chicken without skin, eggs, beans, and tofu. Avoid fatty meat, cured and processed meat, or chicken with skin. Avoid pre-made or processed food. Limit the amount of salt in your diet to less than 1,500 mg each day. Do not drink alcohol if: Your doctor tells you not to drink. You are pregnant, may be pregnant, or are planning to become pregnant. If you drink alcohol: Limit how much you have to: 0-1 drink a day for women. 0-2 drinks a day for men. Know how much alcohol is in your drink. In the [place] one drink equals one 12 oz bottle of beer (355 mL), one 5 oz glass of wine (148 mL), or one 1 oz glass of hard liquor (44 mL). Lifestyle  Work with your doctor to stay at a healthy weight or to lose weight. Ask your doctor what the best weight is for you. Get at least 30 minutes of exercise that causes your heart to beat faster (aerobic exercise) most days of the week. This may include walking, swimming, or biking. Get at least 30 minutes of exercise that strengthens your muscles (resistance exercise) at least 3 days a week. This may include lifting weights or doing Pilates. Do not smoke or use any products that contain nicotine or tobacco. If you need help quitting, ask your doctor. Check your blood pressure at home as told by your doctor. Keep all follow-up visits. Medicines Take over-the-counter and prescription medicines  only as told by your doctor. Follow directions carefully. Do not skip doses of blood pressure medicine. The medicine does not work as well if you skip doses. Skipping doses also puts you at risk for problems. Ask your doctor about side effects or reactions to medicines that you should watch  for. Contact a doctor if: You think you are having a reaction to the medicine you are taking. You have headaches that keep coming back. You feel dizzy. You have swelling in your ankles. You have trouble with your vision. Get help right away if: You get a very bad headache. You start to feel mixed up (confused). You feel weak or numb. You feel faint. You have very bad pain in your: Chest. Belly (abdomen). You vomit more than once. You have trouble breathing. These symptoms may be an emergency. Get help right away. Call 911. Do not wait to see if the symptoms will go away. Do not drive yourself to the hospital. Summary Hypertension is another name for high blood pressure. High blood pressure forces your heart to work harder to pump blood. For most people, a normal blood pressure is less than 120/80. Making healthy choices can help lower blood pressure. If your blood pressure does not get lower with healthy choices, you may need to take medicine. This information is not intended to replace advice given to you by your health care provider. Make sure you discuss any questions you have with your health care provider. Document Revised: 07/13/2021 Document Reviewed: 07/13/2021 Elsevier Patient Education  2024 [employer].

## 2024-09-19 ENCOUNTER — Ambulatory Visit: Payer: Self-pay | Admitting: Internal Medicine

## 2024-09-24 LAB — HM MAMMOGRAPHY

## 2024-09-26 DIAGNOSIS — K219 Gastro-esophageal reflux disease without esophagitis: Secondary | ICD-10-CM | POA: Insufficient documentation

## 2024-09-26 NOTE — Assessment & Plan Note (Signed)
 Chronic, well controlled. Heart rate 65 bpm. Metoprolol  at half dose due to low heart rate. Aspirin  taken with breakfast to avoid stomach upset. Furosemide  and fenofibrate  continued. - Continue metoprolol  at half dose. - Take aspirin  with breakfast. - Continue furosemide  and fenofibrate  as prescribed.

## 2024-09-26 NOTE — Assessment & Plan Note (Signed)
 Reflux symptoms with occasional vomiting. Switched from famotidine  to pantoprazole . Symptoms may be exacerbated by aspirin  intake without food. - Discontinued famotidine . - Started pantoprazole  with a 30-day supply. - Instructed to monitor symptoms and adjust dose if necessary. - Advised to take aspirin  with food to prevent stomach upset. - Educated on reflux management, including avoiding eating three hours before lying down.

## 2024-09-26 NOTE — Assessment & Plan Note (Signed)
 BMI 37 with underlying hypertension.  She is encouraged to strive for BMI less than 30 to decrease cardiac risk. Advised to aim for at least 150 minutes of exercise per week.

## 2024-09-26 NOTE — Assessment & Plan Note (Signed)
 Chronic, LDL goal is less than 70. She is statin intolerant. She will continue with fenofibrate 145mg  daily. Encouraged to follow a heart healthy diet.

## 2024-09-26 NOTE — Assessment & Plan Note (Signed)
 Managed with fenofibrate  as prescribed by cardiologist. - Continue fenofibrate  as prescribed.

## 2024-09-26 NOTE — Assessment & Plan Note (Signed)
 Chronic, she is encouraged to keep BP well controlled and to stay well hydrated to decrease risk of CKD progression.

## 2024-09-29 ENCOUNTER — Encounter: Payer: Self-pay | Admitting: Internal Medicine

## 2024-10-21 ENCOUNTER — Ambulatory Visit: Payer: Self-pay

## 2024-10-26 ENCOUNTER — Ambulatory Visit: Admitting: Physician Assistant

## 2024-11-03 ENCOUNTER — Ambulatory Visit: Attending: Physician Assistant | Admitting: Physician Assistant

## 2024-11-03 ENCOUNTER — Other Ambulatory Visit: Payer: Self-pay | Admitting: Internal Medicine

## 2024-11-03 VITALS — BP 126/62 | HR 61 | Ht 61.0 in | Wt 202.2 lb

## 2024-11-03 DIAGNOSIS — R011 Cardiac murmur, unspecified: Secondary | ICD-10-CM | POA: Diagnosis not present

## 2024-11-03 DIAGNOSIS — I350 Nonrheumatic aortic (valve) stenosis: Secondary | ICD-10-CM | POA: Diagnosis not present

## 2024-11-03 DIAGNOSIS — I10 Essential (primary) hypertension: Secondary | ICD-10-CM | POA: Diagnosis not present

## 2024-11-03 DIAGNOSIS — Z01810 Encounter for preprocedural cardiovascular examination: Secondary | ICD-10-CM | POA: Diagnosis not present

## 2024-11-03 DIAGNOSIS — I5032 Chronic diastolic (congestive) heart failure: Secondary | ICD-10-CM

## 2024-11-03 NOTE — Patient Instructions (Signed)
 Medication Instructions:  Increase lasix  to 40 mg daily for the next 3 days then resume usual dose Increase potassium to 40 meq daily for the next 3 days then resume usual dose *If you need a refill on your cardiac medications before your next appointment, please call your pharmacy*  Lab Work: None ordered If you have labs (blood work) drawn today and your tests are completely normal, you will receive your results only by: MyChart Message (if you have MyChart) OR A paper copy in the mail If you have any lab test that is abnormal or we need to change your treatment, we will call you to review the results.  Testing/Procedures: Your physician has requested that you have an echocardiogram. Echocardiography is a painless test that uses sound waves to create images of your heart. It provides your doctor with information about the size and shape of your heart and how well your hearts chambers and valves are working. This procedure takes approximately one hour. There are no restrictions for this procedure. Please do NOT wear cologne, perfume, aftershave, or lotions (deodorant is allowed). Please arrive 15 minutes prior to your appointment time.  Please note: We ask at that you not bring children with you during ultrasound (echo/ vascular) testing. Due to room size and safety concerns, children are not allowed in the ultrasound rooms during exams. Our front office staff cannot provide observation of children in our lobby area while testing is being conducted. An adult accompanying a patient to their appointment will only be allowed in the ultrasound room at the discretion of the ultrasound technician under special circumstances. We apologize for any inconvenience.   Follow-Up: At [facility], you and your health needs are our priority.  As part of our continuing mission to provide you with exceptional heart care, our providers are all part of one team.  This team includes your primary  Cardiologist (physician) and Advanced Practice Providers or APPs (Physician Assistants and Nurse Practitioners) who all work together to provide you with the care you need, when you need it.  Your next appointment:   4-6 month(s)  Provider:   Alm Clay, MD    Weigh every morning after using the restroom, before breakfast and call and let us  know if you have a weight gain of 2 lbs or more overnight or 5 lbs or more in a week.    Low-Sodium Eating Plan Salt (sodium) helps you keep a healthy balance of fluids in your body. Too much sodium can raise your blood pressure. It can also cause fluid and waste to be held in your body. Your health care provider or dietitian may recommend a low-sodium eating plan if you have high blood pressure (hypertension), kidney disease, liver disease, or heart failure. Eating less sodium can help lower your blood pressure and reduce swelling. It can also protect your heart, liver, and kidneys. What are tips for following this plan? Reading food labels  Check food labels for the amount of sodium per serving. If you eat more than one serving, you must multiply the listed amount by the number of servings. Choose foods with less than 140 milligrams (mg) of sodium per serving. Avoid foods with 300 mg of sodium or more per serving. Always check how much sodium is in a product, even if the label says unsalted or no salt added. Shopping  Buy products labeled as low-sodium or no salt added. Buy fresh foods. Avoid canned foods and pre-made or frozen meals. Avoid canned, cured,  or processed meats. Buy breads that have less than 80 mg of sodium per slice. Cooking  Eat more home-cooked food. Try to eat less restaurant, buffet, and fast food. Try not to add salt when you cook. Use salt-free seasonings or herbs instead of table salt or sea salt. Check with your provider or pharmacist before using salt substitutes. Cook with plant-based oils, such as canola,  sunflower, or olive oil. Meal planning When eating at a restaurant, ask if your food can be made with less salt or no salt. Avoid dishes labeled as brined, pickled, cured, or smoked. Avoid dishes made with soy sauce, miso, or teriyaki sauce. Avoid foods that have monosodium glutamate (MSG) in them. MSG may be added to some restaurant food, sauces, soups, bouillon, and canned foods. Make meals that can be grilled, baked, poached, roasted, or steamed. These are often made with less sodium. General information Try to limit your sodium intake to 1,500-2,300 mg each day, or the amount told by your provider. What foods should I eat? Fruits Fresh, frozen, or canned fruit. Fruit juice. Vegetables Fresh or frozen vegetables. No salt added canned vegetables. No salt added tomato sauce and paste. Low-sodium or reduced-sodium tomato and vegetable juice. Grains Low-sodium cereals, such as oats, puffed wheat and rice, and shredded wheat. Low-sodium crackers. Unsalted rice. Unsalted pasta. Low-sodium bread. Whole grain breads and whole grain pasta. Meats and other proteins Fresh or frozen meat, poultry, seafood, and fish. These should have no added salt. Low-sodium canned tuna and salmon. Unsalted nuts. Dried peas, beans, and lentils without added salt. Unsalted canned beans. Eggs. Unsalted nut butters. Dairy Milk. Soy milk. Cheese that is naturally low in sodium, such as ricotta cheese, fresh mozzarella, or Swiss cheese. Low-sodium or reduced-sodium cheese. Cream cheese. Yogurt. Seasonings and condiments Fresh and dried herbs and spices. Salt-free seasonings. Low-sodium mustard and ketchup. Sodium-free salad dressing. Sodium-free light mayonnaise. Fresh or refrigerated horseradish. Lemon juice. Vinegar. Other foods Homemade, reduced-sodium, or low-sodium soups. Unsalted popcorn and pretzels. Low-salt or salt-free chips. The items listed above may not be all the foods and drinks you can have. Talk to a  dietitian to learn more. What foods should I avoid? Vegetables Sauerkraut, pickled vegetables, and relishes. Olives. French fries. Onion rings. Regular canned vegetables, except low-sodium or reduced-sodium items. Regular canned tomato sauce and paste. Regular tomato and vegetable juice. Frozen vegetables in sauces. Grains Instant hot cereals. Bread stuffing, pancake, and biscuit mixes. Croutons. Seasoned rice or pasta mixes. Noodle soup cups. Boxed or frozen macaroni and cheese. Regular salted crackers. Self-rising flour. Meats and other proteins Meat or fish that is salted, canned, smoked, spiced, or pickled. Precooked or cured meat, such as sausages or meat loaves. [place]. Ham. Pepperoni. Hot dogs. Corned beef. Chipped beef. Salt pork. Jerky. Pickled herring, anchovies, and sardines. Regular canned tuna. Salted nuts. Dairy Processed cheese and cheese spreads. Hard cheeses. Cheese curds. Blue cheese. Feta cheese. String cheese. Regular cottage cheese. Buttermilk. Canned milk. Fats and oils Salted butter. Regular margarine. Ghee. Bacon fat. Seasonings and condiments Onion salt, garlic salt, seasoned salt, table salt, and sea salt. Canned and packaged gravies. Worcestershire sauce. Tartar sauce. Barbecue sauce. Teriyaki sauce. Soy sauce, including reduced-sodium soy sauce. Steak sauce. Fish sauce. Oyster sauce. Cocktail sauce. Horseradish that you find on the shelf. Regular ketchup and mustard. Meat flavorings and tenderizers. Bouillon cubes. Hot sauce. Pre-made or packaged marinades. Pre-made or packaged taco seasonings. Relishes. Regular salad dressings. Salsa. Other foods Salted popcorn and pretzels. Corn chips and  puffs. Potato and tortilla chips. Canned or dried soups. Pizza. Frozen entrees and pot pies. The items listed above may not be all the foods and drinks you should avoid. Talk to a dietitian to learn more. This information is not intended to replace advice given to you by your health  care provider. Make sure you discuss any questions you have with your health care provider. Document Revised: 10/11/2022 Document Reviewed: 10/11/2022 Elsevier Patient Education  2024 [employer].

## 2024-11-03 NOTE — Progress Notes (Signed)
 " Cardiology Office Note   Date:  11/03/2024  ID:  [name], [name] 11-25-1932, MRN [id number] PCP: Jarold Medici, MD  [facility] Providers Cardiologist:  Alm Clay, MD   History of Present Illness [name] is a 89 y.o. female with a past medical history significant for hypertension, OSA on CPAP, status post partial or empty L lobectomy for PNA, and diastolic dysfunction here for follow-up visit.  History includes initially being seen 3/15 for chest pain which was musculoskeletal in nature.  Echo and nuclear stress test in 2012 were negative for ischemia.  On 6/18 she was seen for preoperative valuation.  Her echo 9/17 showed normal EF and G2 DD with mild aortic stenosis and mildly elevated PAP.  She had planned for hip surgery.  However, she was not having significant symptoms.  She was felt to be low risk for her hip replacement surgery.  She is followed up by Lamarr Satterfield, DNP 10/19.  She was being evaluated for right hip surgery at that time.  Was deemed to be low risk.  She did not undergo her procedure.  She was seen by Dr. Burnard 3/20 for preoperative valuation and her pulse was noted to be in the 40s.  She was seen 2/21 by Lamarr Satterfield, DNP.  She reported having trouble with both knees.  She denied chest pain.  She noted dyspnea with climbing stairs.  She noted to have postnasal drip.  She was treated with OTC and antihistamine medications.  She underwent left total knee arthroplasty 5/21.  Was seen in follow-up by Dr. Clay 11/21/2020.  During that time remained stable from a cardiovascular standpoint.  Continues to have some pain and stiffness in her left knee.  She reported her hips were feeling somewhat better.  Had limited walking and was not doing regular exercise.  Gained about 5 pounds.  Was noted to have dyspnea with exertion which was felt to be related to deconditioning.  Only noted dyspnea with going up stairs.  Denied chest pain or pressure.  She  would have minimal rare palpitations.  She was seen May of last year for evaluation.  She reported that her knee replacement was having difficulty.  Continue to be very physically active at home doing all of her work around the house and also enjoyed doing light yard work.  Reviewed importance of staying active and a low-sodium heart healthy diet.  She denied chest pain, shortness of breath, lower extremity edema, fatigue, palpitations, melena, hematuria, hemoptysis, diaphoresis, weakness, presyncope/syncope, orthopnea and PND.  I saw her 04/24/24, she presents with  mild aortic stenosis and an increase in her shortness of breath.  She experiences chronic shortness of breath with minimal exertion, such as walking from the kitchen to the bedroom. This symptom was present before starting a new exercise program at the [facility]. She sometimes has a sensation of phlegm in her throat accompanying the shortness of breath.  She underwent a lobectomy for pneumonia years ago and does not take any medications specifically for lung issues. She is unsure if the lobectomy contributes to her breathing difficulties.  Her current medications include Lasix  (furosemide ) 20 mg daily.  Her weight has decreased from 198 pounds to 195 pounds.  She recalls a previous echocardiogram showing moderate calcification on the aortic valve with mild narrowing, but no severe stenosis. Her heart pump function was reported as normal with an ejection fraction of 60-65%. A past EKG showed sinus rhythm at 77 beats per minute,  consistent with previous results.  Reports no chest pain, pressure, or tightness. No orthopnea, PND. Reports no palpitations.   Discussed the use of AI scribe software for clinical note transcription with the patient, who gave verbal consent to proceed.   Today, she presents with a hx of diastolic dysfunction with shortness of breath which is not with exertion.   She experiences intermittent shortness of breath,  particularly with exertion, such as walking or rushing to answer the phone. This is accompanied by a sensation of panic and a feeling of something in her throat. She also has a persistent cough. She is currently taking furosemide  and metoprolol . She has gained seven to eight pounds since December, which she attributes to the holiday season. She is concerned about weight gain and increased shortness of breath, as well as frequent urination if her Lasix  dose is increased. During the visit, she was informed of a heart murmur. A previous echocardiogram showed a trivial mitral valve leak and moderate calcification and thickening of the aortic valve with mild stenosis.  Reports no shortness of breath nor dyspnea on exertion. Reports no chest pain, pressure, or tightness. No edema, orthopnea, PND. Reports no palpitations.   Discussed the use of AI scribe software for clinical note transcription with the patient, who gave verbal consent to proceed.   ROS: pertinent ROS in HPI  Studies Reviewed      Echocardiogram 07/05/2016   Study Conclusions   - Left ventricle: The cavity size was normal. Systolic function was    normal. The estimated ejection fraction was in the range of 60%    to 65%. Wall motion was normal; there were no regional wall    motion abnormalities. Features are consistent with a pseudonormal    left ventricular filling pattern, with concomitant abnormal    relaxation and increased filling pressure (grade 2 diastolic    dysfunction).  - Aortic valve: There was very mild stenosis. Valve area (VTI):    2.01 cm^2. Valve area (Vmax): 2.09 cm^2. Valve area (Vmean): 1.96    cm^2.  - Mitral valve: Calcified annulus.  - Left atrium: The atrium was mildly dilated.  - Pulmonary arteries: Systolic pressure was mildly to moderately    increased. PA peak pressure: 46 mm Hg (S).   -------------------------------------------------------------------  Study data:  Comparison was made to the  study of 07/08/2012.  Study  status:  Routine.  Procedure:  The patient reported no pain pre or  post test. Transthoracic echocardiography. Image quality was good.  Study completion:  There were no complications.  Transthoracic echocardiography.  M-mode, complete 2D, spectral  Doppler, and color Doppler.  Birthdate:  Patient birthdate:  January 28, 1933.  Age:  Patient is 89 yr old.  Sex:  Gender: female.  BMI: 39.1 kg/m^2.  Blood pressure:     136/61  Patient status:  Inpatient.  Study date:  Study date: 07/05/2016. Study time: 10:06  AM.  Location:  Bedside.   -------------------------------------------------------------------   -------------------------------------------------------------------  Left ventricle:  The cavity size was normal. Systolic function was  normal. The estimated ejection fraction was in the range of 60% to  65%. Wall motion was normal; there were no regional wall motion  abnormalities. Features are consistent with a pseudonormal left  ventricular filling pattern, with concomitant abnormal relaxation  and increased filling pressure (grade 2 diastolic dysfunction).   -------------------------------------------------------------------  Aortic valve:   Trileaflet; mildly thickened, mildly calcified  leaflets. Mobility was not restricted.  Doppler:   There was very  mild stenosis.   There was no regurgitation.    VTI ratio of LVOT  to aortic valve: 0.7. Valve area (VTI): 2.01 cm^2. Indexed valve  area (VTI): 0.98 cm^2/m^2. Peak velocity ratio of LVOT to aortic  valve: 0.73. Valve area (Vmax): 2.09 cm^2. Indexed valve area  (Vmax): 1.01 cm^2/m^2. Mean velocity ratio of LVOT to aortic valve:  0.68. Valve area (Vmean): 1.96 cm^2. Indexed valve area (Vmean):  0.95 cm^2/m^2.    Mean gradient (S): 10 mm Hg. Peak gradient (S):  19 mm Hg.   -------------------------------------------------------------------  Aorta: Aortic root: The aortic root was normal in size.    -------------------------------------------------------------------  Mitral valve:   Calcified annulus. Mobility was not restricted.  Doppler:  Transvalvular velocity was within the normal range. There  was no evidence for stenosis. There was trivial regurgitation.  Peak gradient (D): 4 mm Hg.   -------------------------------------------------------------------  Left atrium:  The atrium was mildly dilated.   -------------------------------------------------------------------  Right ventricle:  The cavity size was normal. Wall thickness was  normal. Systolic function was normal.   -------------------------------------------------------------------  Pulmonic valve:    Structurally normal valve.   Cusp separation was  normal.  Doppler:  Transvalvular velocity was within the normal  range. There was no evidence for stenosis. There was trivial  regurgitation.   -------------------------------------------------------------------  Tricuspid valve:   Structurally normal valve.    Doppler:  Transvalvular velocity was within the normal range. There was mild  regurgitation.   -------------------------------------------------------------------  Pulmonary artery:   The main pulmonary artery was normal-sized.  Systolic pressure was mildly to moderately increased.   -------------------------------------------------------------------  Right atrium:  The atrium was normal in size.   -------------------------------------------------------------------  Pericardium: There was no pericardial effusion.   -------------------------------------------------------------------  Systemic veins:  Inferior vena cava: The vessel was dilated. The respirophasic  diameter changes were blunted (< 50%), consistent with elevated  central venous pressure.    Physical Exam VS:  BP 126/62   Pulse 61   Ht 5' 1 (1.549 m)   Wt 202 lb 3.2 oz (91.7 kg)   SpO2 95%   BMI 38.21 kg/m        Wt Readings from  Last 3 Encounters:  11/03/24 202 lb 3.2 oz (91.7 kg)  09/16/24 199 lb (90.3 kg)  09/16/24 199 lb 12.8 oz (90.6 kg)    GEN: Well nourished, well developed in no acute distress NECK: No JVD; No carotid bruits CARDIAC: RRR, no murmurs, rubs, gallops RESPIRATORY:  Clear to auscultation without rales, wheezing or rhonchi  ABDOMEN: Soft, non-tender, non-distended EXTREMITIES:  No edema; No deformity   ASSESSMENT AND PLAN  Heart failure with preserved ejection fraction (diastolic dysfunction) Diastolic dysfunction with impaired relaxation, fluid overload, and shortness of breath. Ejection fraction normal at 60-65%. Recent weight gain and ankle puffiness indicate fluid retention. - Increased Lasix  to 40 mg daily for 3 days, then return to original dosing. - Ordered echocardiogram to assess heart pump function and valve health. - Advised low sodium diet. - Recommended compression socks for prolonged standing or walking. - Encouraged elevation of feet when possible. - Instructed to monitor weight and report if unchanged or increases.  Aortic valve stenosis Moderate calcification and thickening of the aortic valve with mild stenosis. Murmur likely due to calcification. - Ordered echocardiogram to assess aortic valve stenosis.  Mitral valve insufficiency Trivial leak noted on previous echocardiogram. - Ordered echocardiogram to assess mitral valve function.  Essential hypertension Blood pressure and heart rate well-controlled on current medication.  Metoprolol  effective. - Continue current dose of metoprolol .  Signed, Orren LOISE Fabry, PA-C   "

## 2024-11-06 ENCOUNTER — Encounter: Payer: Self-pay | Admitting: Physician Assistant

## 2024-12-09 ENCOUNTER — Ambulatory Visit (HOSPITAL_COMMUNITY)

## 2025-03-17 ENCOUNTER — Ambulatory Visit: Admitting: Internal Medicine

## 2025-03-23 ENCOUNTER — Ambulatory Visit: Admitting: Cardiology

## 2025-09-16 ENCOUNTER — Ambulatory Visit: Payer: Self-pay | Admitting: Internal Medicine

## 2025-09-17 ENCOUNTER — Ambulatory Visit
